# Patient Record
Sex: Male | Born: 1935
Health system: Southern US, Community
[De-identification: ages and names within clinical notes are randomized; demographics above are authoritative.]

## PROBLEM LIST (undated history)

## (undated) DIAGNOSIS — Z950 Presence of cardiac pacemaker: Secondary | ICD-10-CM

## (undated) DIAGNOSIS — E785 Hyperlipidemia, unspecified: Secondary | ICD-10-CM

## (undated) DIAGNOSIS — I2510 Atherosclerotic heart disease of native coronary artery without angina pectoris: Secondary | ICD-10-CM

## (undated) DIAGNOSIS — I82409 Acute embolism and thrombosis of unspecified deep veins of unspecified lower extremity: Secondary | ICD-10-CM

## (undated) DIAGNOSIS — H9190 Unspecified hearing loss, unspecified ear: Secondary | ICD-10-CM

## (undated) DIAGNOSIS — Z860100 Personal history of colon polyps, unspecified: Secondary | ICD-10-CM

## (undated) DIAGNOSIS — I255 Ischemic cardiomyopathy: Secondary | ICD-10-CM

## (undated) DIAGNOSIS — I739 Peripheral vascular disease, unspecified: Secondary | ICD-10-CM

## (undated) DIAGNOSIS — I480 Paroxysmal atrial fibrillation: Secondary | ICD-10-CM

## (undated) DIAGNOSIS — R001 Bradycardia, unspecified: Secondary | ICD-10-CM

## (undated) DIAGNOSIS — Z8619 Personal history of other infectious and parasitic diseases: Secondary | ICD-10-CM

## (undated) DIAGNOSIS — I10 Essential (primary) hypertension: Secondary | ICD-10-CM

## (undated) DIAGNOSIS — H269 Unspecified cataract: Secondary | ICD-10-CM

## (undated) DIAGNOSIS — Z8711 Personal history of peptic ulcer disease: Secondary | ICD-10-CM

## (undated) DIAGNOSIS — T827XXA Infection and inflammatory reaction due to other cardiac and vascular devices, implants and grafts, initial encounter: Secondary | ICD-10-CM

## (undated) DIAGNOSIS — N183 Chronic kidney disease, stage 3 unspecified: Secondary | ICD-10-CM

## (undated) DIAGNOSIS — I4892 Unspecified atrial flutter: Secondary | ICD-10-CM

## (undated) DIAGNOSIS — Z8739 Personal history of other diseases of the musculoskeletal system and connective tissue: Secondary | ICD-10-CM

## (undated) DIAGNOSIS — I5022 Chronic systolic (congestive) heart failure: Secondary | ICD-10-CM

## (undated) DIAGNOSIS — Z8719 Personal history of other diseases of the digestive system: Secondary | ICD-10-CM

## (undated) DIAGNOSIS — H353190 Nonexudative age-related macular degeneration, unspecified eye, stage unspecified: Secondary | ICD-10-CM

## (undated) DIAGNOSIS — C61 Malignant neoplasm of prostate: Secondary | ICD-10-CM

## (undated) HISTORY — PX: COLONOSCOPY: SHX174

## (undated) HISTORY — DX: Malignant neoplasm of prostate: C61

## (undated) HISTORY — PX: APPENDECTOMY: SHX54

## (undated) HISTORY — DX: Atherosclerotic heart disease of native coronary artery without angina pectoris: I25.10

## (undated) HISTORY — PX: INSERT / REPLACE / REMOVE PACEMAKER: SUR710

## (undated) HISTORY — PX: CARDIAC CATHETERIZATION: SHX172

## (undated) HISTORY — DX: Peripheral vascular disease, unspecified: I73.9

## (undated) HISTORY — DX: Hyperlipidemia, unspecified: E78.5

## (undated) HISTORY — PX: ANGIOPLASTY: SHX39

## (undated) HISTORY — PX: CORONARY ANGIOPLASTY: SHX604

---

## 1988-05-21 HISTORY — PX: CORONARY ARTERY BYPASS GRAFT: SHX141

## 2000-03-06 ENCOUNTER — Ambulatory Visit (HOSPITAL_COMMUNITY): Admission: RE | Admit: 2000-03-06 | Discharge: 2000-03-06 | Payer: Self-pay | Admitting: Cardiovascular Disease

## 2001-01-09 ENCOUNTER — Encounter (INDEPENDENT_AMBULATORY_CARE_PROVIDER_SITE_OTHER): Payer: Self-pay | Admitting: Specialist

## 2001-01-09 ENCOUNTER — Other Ambulatory Visit: Admission: RE | Admit: 2001-01-09 | Discharge: 2001-01-09 | Payer: Self-pay | Admitting: Gastroenterology

## 2002-06-24 HISTORY — PX: DOPPLER ECHOCARDIOGRAPHY: SHX263

## 2003-09-14 ENCOUNTER — Ambulatory Visit (HOSPITAL_COMMUNITY): Admission: RE | Admit: 2003-09-14 | Discharge: 2003-09-15 | Payer: Self-pay | Admitting: Cardiology

## 2004-04-05 ENCOUNTER — Ambulatory Visit: Payer: Self-pay | Admitting: Gastroenterology

## 2004-04-18 ENCOUNTER — Ambulatory Visit: Payer: Self-pay | Admitting: Gastroenterology

## 2006-03-03 ENCOUNTER — Observation Stay (HOSPITAL_COMMUNITY): Admission: EM | Admit: 2006-03-03 | Discharge: 2006-03-04 | Payer: Self-pay | Admitting: Emergency Medicine

## 2007-09-16 ENCOUNTER — Ambulatory Visit: Payer: Self-pay | Admitting: Gastroenterology

## 2007-10-15 ENCOUNTER — Encounter: Payer: Self-pay | Admitting: Gastroenterology

## 2007-10-15 ENCOUNTER — Ambulatory Visit: Payer: Self-pay | Admitting: Gastroenterology

## 2007-10-16 ENCOUNTER — Encounter: Payer: Self-pay | Admitting: Gastroenterology

## 2008-05-03 ENCOUNTER — Inpatient Hospital Stay (HOSPITAL_COMMUNITY): Admission: EM | Admit: 2008-05-03 | Discharge: 2008-05-08 | Payer: Self-pay | Admitting: Emergency Medicine

## 2009-08-23 ENCOUNTER — Encounter: Payer: Self-pay | Admitting: Internal Medicine

## 2009-08-23 ENCOUNTER — Ambulatory Visit: Admission: RE | Admit: 2009-08-23 | Discharge: 2009-10-31 | Payer: Self-pay | Admitting: Radiation Oncology

## 2010-01-27 ENCOUNTER — Ambulatory Visit: Payer: Self-pay | Admitting: Cardiovascular Disease

## 2010-02-25 ENCOUNTER — Encounter: Payer: Self-pay | Admitting: Internal Medicine

## 2010-03-15 ENCOUNTER — Ambulatory Visit: Payer: Self-pay | Admitting: Internal Medicine

## 2010-06-16 ENCOUNTER — Encounter: Payer: Self-pay | Admitting: Internal Medicine

## 2010-06-16 ENCOUNTER — Ambulatory Visit
Admission: RE | Admit: 2010-06-16 | Discharge: 2010-06-16 | Payer: Self-pay | Source: Home / Self Care | Attending: Internal Medicine | Admitting: Internal Medicine

## 2010-06-16 DIAGNOSIS — I4891 Unspecified atrial fibrillation: Secondary | ICD-10-CM | POA: Insufficient documentation

## 2010-06-16 DIAGNOSIS — I495 Sick sinus syndrome: Secondary | ICD-10-CM | POA: Insufficient documentation

## 2010-06-16 DIAGNOSIS — I10 Essential (primary) hypertension: Secondary | ICD-10-CM | POA: Insufficient documentation

## 2010-06-16 DIAGNOSIS — I2510 Atherosclerotic heart disease of native coronary artery without angina pectoris: Secondary | ICD-10-CM | POA: Insufficient documentation

## 2010-06-20 NOTE — Procedures (Signed)
Summary: pacer check/medtronic   Current Medications (verified): 1)  Lisinopril 10 Mg Tabs (Lisinopril) .... One By Mouth Daily  Allergies (verified): No Known Drug Allergies  PPM Specifications Following MD:  Thompson Grayer, MD     Referring MD:  Vira Agar PPM Vendor:  Medtronic     PPM Model Number:  [id number]     PPM Serial Number:  [id number] PPM DOI:  09/14/2003     PPM Implanting MD:  Romeo Apple, MD  Lead 1    Location: RA     DOI: 09/14/2003     Model #: [id number]     Serial #: [id number]     Status: active Lead 2    Location: RV     DOI: 09/14/2003     Model #: [id number]     Serial #: [id number]     Status: active  Magnet Response Rate:  BOL 85 ERI 65  Indications:  Sick sinus syndrome   PPM Follow Up Remote Check?  No Battery Voltage:  2.74 V     Battery Est. Longevity:  2.5 years     Pacer Dependent:  Yes       PPM Device Measurements Atrium  Impedance: 453 ohms, Threshold: 0.5 V at 0.4 msec Right Ventricle  Amplitude: 15.68 mV, Impedance: 457 ohms, Threshold: 0.5 V at 0.4 msec  Episodes MS Episodes:  42     Percent Mode Switch:  <0.1%     Coumadin:  No Ventricular High Rate:  0     Atrial Pacing:  95.8%     Ventricular Pacing:  7.7%  Parameters Mode:  DDDR     Lower Rate Limit:  60     Upper Rate Limit:  130 Paced AV Delay:  280     Sensed AV Delay:  200 Next Cardiology Appt Due:  05/21/2010 Tech Comments:  No parameter changes. Device function normal. No Carelink @ this time.  ROV 60months with Dr. Rayann Heman. Alma Friendly, LPN  October 26, 624THL 12:29 PM

## 2010-06-20 NOTE — Miscellaneous (Signed)
Summary: Device preload  Clinical Lists Changes  Observations: Added new observation of PPM INDICATN: Sick sinus syndrome (02/25/2010 16:53) Added new observation of MAGNET RTE: BOL 85 ERI 65 (02/25/2010 16:53) Added new observation of PPMLEADSTAT2: active (02/25/2010 16:53) Added new observation of PPMLEADSER2: [id number]  (02/25/2010 16:53) Added new observation of PPMLEADMOD2: 4470  (02/25/2010 16:53) Added new observation of PPMLEADDOI2: 09/14/2003  (02/25/2010 16:53) Added new observation of PPMLEADLOC2: RV  (02/25/2010 16:53) Added new observation of PPMLEADSTAT1: active  (02/25/2010 16:53) Added new observation of PPMLEADSER1: [id number]  (02/25/2010 16:53) Added new observation of PPMLEADMOD1: 4469  (02/25/2010 16:53) Added new observation of PPMLEADDOI1: 09/14/2003  (02/25/2010 16:53) Added new observation of PPMLEADLOC1: RA  (02/25/2010 16:53) Added new observation of PPM IMP MD: Romeo Apple, MD  (02/25/2010 16:53) Added new observation of PPM DOI: 09/14/2003  (02/25/2010 16:53) Added new observation of PPM SERL#: [id number]  (02/25/2010 16:53) Added new observation of PPM MODL#: [id number]  (02/25/2010 16:53) Added new observation of PACEMAKERMFG: Medtronic  (02/25/2010 16:53) Added new observation of PPM REFER MD: Vira Agar  (02/25/2010 16:53) Added new observation of PACEMAKER MD: Thompson Grayer, MD  (02/25/2010 16:53)      PPM Specifications Following MD:  Thompson Grayer, MD     Referring MD:  Vira Agar PPM Vendor:  Medtronic     PPM Model Number:  [id number]     PPM Serial Number:  [id number] PPM DOI:  09/14/2003     PPM Implanting MD:  Romeo Apple, MD  Lead 1    Location: RA     DOI: 09/14/2003     Model #: [id number]     Serial #: [id number]     Status: active Lead 2    Location: RV     DOI: 09/14/2003     Model #: [id number]     Serial #: [id number]     Status: active  Magnet Response Rate:  BOL 85 ERI 65  Indications:  Sick sinus syndrome

## 2010-06-22 NOTE — Assessment & Plan Note (Signed)
Summary: [email or web address]   Visit Type:  Pacemaker check Referring Provider:  Dr Acie Fredrickson Primary Provider:  none   History of Present Illness: Marcus [name] is a 77 yo WM with a h/o symptomatic bradycardia s/p PPM (MDT) by Dr Doreatha Lew 2005, paroxysmal atrial fibrillation, and CAD who presents today to establish care in the pacemaker clinic.  He reports doing well recenlty.  He has been noncompliant with his medicines, but continues to take a long list of supplements.  He states that he has no intention of taking prescribed medicines.  He is however complaint with ASA. The patient denies symptoms of palpitations, chest pain, shortness of breath, orthopnea, PND, lower extremity edema, dizziness, presyncope, syncope, or neurologic sequela. The patient is tolerating medications without difficulties and is otherwise without complaint today.   Current Medications (verified): 1)  Alpha-Lipoic Acid 300 Mg Tabs (Alpha-Lipoic Acid) .... Once Daily 2)  Acetyl L-Carnitine 500 Mg Caps (Acetylcarnitine Hcl) .... Once Daily 3)  Vitamin B Complex-C   Caps (B Complex-C) .... Once Daily 4)  Niacin Cr 1000 Mg Cr-Tabs (Niacin) .... 2 Once Daily 5)  Pyridoxal-5-Phosphate  Powd (Pyridoxal-5 Phosphate) .... Once Daily 6)  Methylcobalamin  Powd (Methylcobalamin) .... Once Daily 7)  Vitamin E 600 Unit  Caps (Vitamin E) .... Once Daily 8)  Vitamin C 500 Mg  Tabs (Ascorbic Acid) .... Once Daily 9)  Quercetin 250 Mg Tabs (Quercetin) .... 1000mg  10)  Benfotiamine 11)  Bio-Curcumin .Marland Kitchen.. 800 Mg 12)  Magnesium Citrate  Powd (Magnesium Citrate) .... 800 Mg Tab 13)  Co Q-10 30 Mg  Caps (Coenzyme Q10) .... 100mg  Tab 14)  Vitamin D 1000 Unit  Tabs (Cholecalciferol) .... 5000iu 15)  Vit K-2 16)  Natural Iodine .Marland Kitchen.. 1045mcg 17)  Omega-3 Krill Oil 300 Mg Caps (Krill Oil) .... Once Daily 18)  Icaps Lutein-Zeaxanthin  Cr-Tabs (Specialty Vitamins Products) .... Once Daily 19)  Dim Complex .... 2000mg  20)  Selenium 200 Mcg Tabs  (Selenium) .... Once Daily 21)  Calcium Carbonate-Vitamin D 600-400 Mg-Unit  Tabs (Calcium Carbonate-Vitamin D) .... 2000mg  22)  Indole-3-Carbinol  Powd (Indole-3-Carbinol) .... 350 Mg 23)  Lycopene 10 Mg Caps (Lycopene) .... 30 Mg 24)  5 Lox Inhibitor .Marland Kitchen.. 100 Mg 25)  Prostate  Caps (Misc Natural Products) .... 1150mg   Allergies (verified): No Known Drug Allergies  Past History:  Past Medical History:  History of coronary artery disease.  He is status post percutaneous       transluminal coronary angioplasty and stenting of the obtuse       marginal artery, and status post percutaneous transluminal coronary       angioplasty and stenting of the right coronary artery.   Marland Kitchen History of coronary artery disease - status post coronary artery       bypass grafting.   Hypertension.   Bradycardia s/p PPM paroxysmal afib- not compliant with coumadin HL  Past Surgical History: CABG PPM (MDT)  Social History: lives in [place] denies TED  Review of Systems       All systems are reviewed and negative except as listed in the HPI.   Vital Signs:  Patient profile:   77 year old male Height:      70 inches Weight:      144 pounds BMI:     20.74 Pulse rate:   85 / minute BP sitting:   146 / 82  (left arm) Cuff size:   regular  Vitals Entered By: Marcus [name], RMA (June 16, 2010 11:35  AM)  Physical Exam  General:  thin, NAD Head:  normocephalic and atraumatic Eyes:  PERRLA/EOM intact; conjunctiva and lids normal. Mouth:  Teeth, gums and palate normal. Oral mucosa normal. Neck:  supple Chest Wall:  pacemaker pocket is well healed Lungs:  Clear bilaterally to auscultation and percussion. Heart:  Non-displaced PMI, chest non-tender; regular rate and rhythm, S1, S2 without murmurs, rubs or gallops. Carotid upstroke normal, no bruit. Normal abdominal aortic size, no bruits. Femorals normal pulses, no bruits. Pedals normal pulses. No edema, no varicosities. Abdomen:  Bowel  sounds positive; abdomen soft and non-tender without masses, organomegaly, or hernias noted. No hepatosplenomegaly. Msk:  Back normal, normal gait. Muscle strength and tone normal. Extremities:  No clubbing or cyanosis. Neurologic:  Alert and oriented x 3. Skin:  Intact without lesions or rashes.   PPM Specifications Following MD:  Thompson Grayer, MD     Referring MD:  Vira Agar PPM Vendor:  Medtronic     PPM Model Number:  [id number]     PPM Serial Number:  [id number] PPM DOI:  09/14/2003     PPM Implanting MD:  Romeo Apple, MD  Lead 1    Location: RA     DOI: 09/14/2003     Model #: [id number]     Serial #: [id number]     Status: active Lead 2    Location: RV     DOI: 09/14/2003     Model #: [id number]     Serial #: [id number]     Status: active  Magnet Response Rate:  BOL 85 ERI 65  Indications:  Sick sinus syndrome   PPM Follow Up Battery Voltage:  2.73 V     Battery Est. Longevity:  2 YRS     Pacer Dependent:  Yes       PPM Device Measurements Atrium  Amplitude: 2.00 mV, Impedance: 466 ohms, Threshold: 0.50 V at 0.40 msec Right Ventricle  Amplitude: 15.68 mV, Impedance: 451 ohms, Threshold: 0.50 V at 0.40 msec  Episodes MS Episodes:  0     Coumadin:  No Ventricular High Rate:  0     Atrial Pacing:  99%     Ventricular Pacing:  2.3%  Parameters Mode:  DDDR     Lower Rate Limit:  60     Upper Rate Limit:  130 Paced AV Delay:  280     Sensed AV Delay:  200 Next Cardiology Appt Due:  11/20/2010 Tech Comments:  NORMAL DEVICE FUNCTION.  NO EPISODES RECORDED. CHANGED RA OUTPUT FROM 1.5 TO 2.0 AND RV OUTPUT FROM 2.0 TO 2.5 V.  PT PREFERS OV RATHER THAN CARELINK. ROV IN 6 MTHS W/DEVICE CLINIC. Shelly Bombard  June 16, 2010 11:45 AM MD Comments:  agree  Impression & Recommendations:  Problem # 1:  SICK SINUS SYNDROME (ICD-427.81) normal pacemaker function as above  Problem # 2:  ATRIAL FIBRILLATION (ICD-427.31) no recent afib by pacemaker pt understands that his CHADSVasc score is at  least 3 but is very clear that he will not take coumadin, pradaxa, or xarelto he will continue ASA  Problem # 3:  ESSENTIAL HYPERTENSION, BENIGN (ICD-401.1) above goal he is noncompliant with medicines  Problem # 4:  CAD (ICD-414.00) no ischemic symptoms he is noncomplaint with medicines but continues to take a host of supplements  Patient Instructions: 1)  Your physician wants you to follow-up in: 6 month follow up with device clinic and 12 months with Dr Vallery Ridge will receive a reminder  letter in the mail two months in advance. If you don't receive a letter, please call our office to schedule the follow-up appointment.

## 2010-06-28 NOTE — Cardiovascular Report (Signed)
Summary: Office Visit   Office Visit   Imported By: Sallee Provencal 06/21/2010 15:17:35  _____________________________________________________________________  External Attachment:    Type:   Image     Comment:   External Document

## 2010-07-12 NOTE — Progress Notes (Signed)
Summary: [facility]: Office Visit  [facility]: Office Visit   Imported By: Roddie Mc 07/03/2010 11:28:32  _____________________________________________________________________  External Attachment:    Type:   Image     Comment:   External Document

## 2010-08-01 ENCOUNTER — Ambulatory Visit (INDEPENDENT_AMBULATORY_CARE_PROVIDER_SITE_OTHER): Payer: Medicare Other | Admitting: Cardiovascular Disease

## 2010-08-01 DIAGNOSIS — Z9861 Coronary angioplasty status: Secondary | ICD-10-CM

## 2010-08-01 DIAGNOSIS — E7800 Pure hypercholesterolemia, unspecified: Secondary | ICD-10-CM

## 2010-10-03 NOTE — Cardiovascular Report (Signed)
NAME:  JOSPEH, SUTHERLAND NO.:  [id number]   MEDICAL RECORD NO.:  [id number]          PATIENT TYPE:  INP   LOCATION:  6529                         FACILITY:  [facility]   PHYSICIAN:  Thayer Headings, M.D. DATE OF BIRTH:  [birth year]   DATE OF PROCEDURE:  05/07/2008  DATE OF DISCHARGE:                            CARDIAC CATHETERIZATION   Marcus [name] is a 77 year old gentleman with a history of congestive  heart failure.  He was recently admitted for worsening shortness of  breath.  He had a heart catheterization which revealed a tight stenosis  in his obtuse marginal artery and left main artery.  He had an occlusion  of his right coronary artery.  He underwent successful PTCA and stenting  of his distal right coronary artery on the initial cath.  He was brought  back today for second stage procedure for PCI to OM1 and protected left  main.   The procedure was PTCA and stenting of the obtuse marginal artery and  left main coronary artery.   The right femoral artery was easily cannulated using modified Seldinger  technique.   HEMODYNAMICS:  Aortic pressure 96/49.   The left main was engaged using an XP-4 guide.  Angiomax was given.  ACT  was 315.   We used multiple wires in an attempt to access the distal circumflex  vessel.  We tried a Programmer, applications, a Energy manager, high-torque floppy  coated wire.  Eventually, we were able to cross with a BMW wire.  On  several occasions, we were out into the plaque and clearly had a small  dissection.  There was no evidence ever of any subintimal dye  extravasation.  There was no evidence of perforation into the  pericardium.  We were finally able to place the BMW wire and verify that  we were intraluminal with a contrast.   Following this, a 2.0 x 15-mm Voyager was positioned across the stenosis  and was inflated up to 8 atmospheres for 43 seconds.  A second inflation  of 8 atmospheres for 27 seconds.  We then pulled the  balloon back  slightly and inflated up to 10 atmospheres for 20 seconds.  At this  point, we determined that it was large enough to place a stent.  We  placed a 2.25 x 28-mm Taxus Adams stent.  It was deployed a 12  atmospheres for 31 seconds in the distal aspect of the stenosis.  Post  stent dilatation was achieved using a 2.5 x 20-mm Voyager [place].  It was  pulled up to 12 atmospheres for 19 seconds and then pulled back more  proximally inflated up to 16 atmospheres for 30 seconds.   Following this, a 3.0 x 18 mm Xience stent was placed into the left main  extending out into the circumflex artery.  It was deployed at 12  atmospheres for 32 seconds.  It was then pulled back out into the left  main and aorta and was inflated up to 15 atmospheres for 16 seconds.  Later inflation was performed to flare the proximal edge.   Post stent dilatation was  achieved using a Voyager [place].  We used a 3.25 x  15-mm balloon.  It was inflated in the middle of the stent for 10  atmospheres for 36 seconds.  It was then positioned in the overlap  section for 14 atmospheres for 18 seconds, then pulled back proximally  and inflated up to 14 atmospheres for 17 seconds.  This gave [place] a very  nice angiographic result.  There is no evidence of edge dissection.  There is a fairly nice lumen.  He does have severe calcification of his  native coronary arteries.   In the middle of the case, the patient began having some tachycardia.  It was not sure whether he was having a supraventricular tachycardia.  He started pacing at a rate of 120-130.  We verified the position of the  wire.  This did not seem to resolve the issue.  We gave him 150 mg of  amiodarone which ultimately resolved this tachycardia.  We will continue  to watch him closely for this.   COMPLICATIONS:  None.   CONCLUSION:  Successful PTCA and stenting of the first obtuse marginal  artery and the left main coronary artery.  He was transferred [id number]   without complications.      Thayer Headings, M.D.  Electronically Signed     PJN/MEDQ  D:  05/07/2008  T:  05/07/2008  Job:  [id number]   cc:   Elta Guadeloupe A. Perini, M.D.

## 2010-10-03 NOTE — Assessment & Plan Note (Signed)
[facility] OFFICE NOTE   NAME:Marcus [name], Marcus [name]                    MRN:          [id number]  DATE:09/16/2007                            DOB:          06-22-1928    GI PROBLEM LIST:  1. Family history of colon cancer. His mother died of colon cancer.  2. Personal history of tubular adenomas. Last colonoscopy November      2005 by Dr. Lyla Son, 2 colon polyps were removed, the largest      was 9 mm taken from the hepatic flexure. This was found to be a      tubular adenoma and he was recommended to have a repeat colonoscopy      3 years from that date.   INTERVAL HISTORY:  Marcus [name] was last seen here at the time of his  colonoscopy by Dr. Lyla Son. He was recommended to have a repeat  colonoscopy in November 2008. He has had no troubles with his bowels,  specifically no constipation, no diarrhea and no bleeding.   CURRENT MEDICATIONS:  He is on no prescription medicines although he is  on at least 20 herbal supplements.   PHYSICAL EXAMINATION:  VITAL SIGNS:  Weight is 152 pounds, blood  pressure 140/70, pulse 60.  CONSTITUTIONAL:  Generally well appearing.  ABDOMEN:  Soft, nontender, nondistended, normal bowel sounds.   ASSESSMENT/PLAN:  A 77 year old man at elevated risk for colorectal  cancer given personal history of 2 adenomas and family history of colon  cancer.   The usual interval for patient's with 1 or 2 small tubular adenomas and  patients with family history of colon cancer is 5 year interval. That  being said, Dr. Velora Heckler recommended he have a colonoscopy 3 years from  now and I will not change that interval from his previous  gastroenterologist and so will arrange for him to have a colonoscopy  performed at his soonest convenience. I will follow colorectal cancer  screening and polyp guidelines following that colonoscopy.     Milus Banister, MD  Electronically Signed    DPJ/MedQ  DD: 09/16/2007  DT: 09/16/2007  Job #: [id number]   cc:   Elta Guadeloupe A. Perini, M.D.

## 2010-10-03 NOTE — Cardiovascular Report (Signed)
NAME:  [name], [name] NO.:  [id number]   MEDICAL RECORD NO.:  [id number]          PATIENT TYPE:  INP   LOCATION:  6529                         FACILITY:  [facility]   PHYSICIAN:  Thayer Headings, M.D. DATE OF BIRTH:  [birth year]   DATE OF PROCEDURE:  05/04/2008  DATE OF DISCHARGE:                            CARDIAC CATHETERIZATION   Marcus [name] is an 77 year old gentleman with a history of  hyperlipidemia and coronary artery disease.  He is status post coronary  artery bypass grafting.  He is also status post PTCA and stenting of his  right coronary artery.  He presents to the hospital yesterday with  congestive heart failure.  He was diuresed.  He had mildly positive  troponin levels.  He had relatively normal CPK levels.  He was referred  for heart catheterization for further evaluation.   PROCEDURE:  Left heart catheterization with coronary angiography with  percutaneous transluminal coronary angioplasty and stenting of the  distal right coronary artery.   The right femoral artery was easily cannulated using a modified  Seldinger technique.   HEMODYNAMICS:  LV pressure was 105/7 with an aortic pressure of 105/53.   ANGIOGRAPHY:  The left main.  The left main has a mid 50% stenosis  followed by distal 70-80% stenosis.  This lesion is fairly, heavily  calcified.   The left anterior descending artery had proximal irregularities.  It was  then occluded after giving off a large septal perforator.   The left circumflex artery has an 80-90% stenosis in the proximal  segment.  This stenosis is fairly eccentric.  Compared to his previous  heart catheterization, this has worsened from 50-60%.  This is a  moderate-to-large branch.   The right coronary artery is occluded distally.  The distal RCA fills  via left-to-right right collaterals.   The saphenous vein graft to the obtuse marginal artery is occluded.  The  left internal mammary artery to the LAD is widely  patent.  There is  brisk flow.   The left ventriculogram reveals moderate-to-severe left ventricular  dysfunction.  There is anteroapical severe hypokinesis.  There is  inferior basilar akinesis.  This inferolateral severe hypokinesis.  The  ejection fraction is 30-35%.   PTCA of the right coronary artery was engaged using a Judkins right 4  side-hole guide.  Angiomax was given.  ACT was 263.   A P[place]ater angioplasty wire was passed down across the occlusion and  into the distal right coronary artery.  We selected the posterolateral  branch initially.  A 2.5 x 20 mm Apex was used to dilate the distal  right coronary artery.  We inflated it up to 6 atmospheres for 47  seconds.  This provided [place] with a very nice view of the distal lesion.  He had good flow down the posterolateral branch in the posterior  descending artery.  The posterior descending artery was a larger vessel,  so we re-directed the wire down the posterior descending artery.  At  that point, a 2.5 x 20 mm XIENCE was positioned across the stenosis.  We  placed the distal edge  of the stent was positioned out into the  posterior descending artery and the more proximal edge extended, past  the original stenosis.  It was deployed at 10 atmospheres for 30  seconds.  This gave a very nice angiographic result with no evidence of  edge dissection.   Post-stent dilatation was achieved using a 2.75 mm x 20 mm Voyager [place].  It was positioned distally and inflated up to 10 atmospheres for 18  seconds.  It was then pulled back to the proximal edge of the stent and  inflated up to 12 atmospheres for 24 seconds.  This gave [place] a very nice  angiographic result with brisk flow down, both branches.   COMPLICATIONS:  None.   CONCLUSION:  Successful PTCA and stenting of the right coronary artery.   The patient still has a tight stenosis in the left main and the left  circumflex artery.  We will discuss doing PTCA and stenting of the  left  main and left circumflex artery on Friday.   We need to start him on standard heart failure medicines.  He will be  started on Coreg and lisinopril.  He has refused to start these  medications in the past, but he has agreed to be more receptive to  starting proper therapy at this point.  His ejection fraction is around  30-35%.      Thayer Headings, M.D.  Electronically Signed     PJN/MEDQ  D:  05/04/2008  T:  05/05/2008  Job:  [id number]   cc:   Elta Guadeloupe A. Perini, M.D.

## 2010-10-03 NOTE — H&P (Signed)
NAMEMarland Kitchen  IZAIAS, MICHALSKI NO.:  [id number]   MEDICAL RECORD NO.:  [id number]          PATIENT TYPE:  INP   LOCATION:  2036                         FACILITY:  [facility]   PHYSICIAN:  Thayer Headings, M.D. DATE OF BIRTH:  12-06-1928   DATE OF ADMISSION:  05/03/2008  DATE OF DISCHARGE:                              HISTORY & PHYSICAL   Marcus [name] is a 77 year old gentleman with a history of coronary  artery disease.  He is status post PTCA and stenting to his right  coronary artery.  He has a history of hypercholesterolemia, heart  murmur, and history of bradycardia.  He also has a history of coronary  artery bypass grafting.   The patient is admitted with progressive shortness of breath.   Marcus [name] was seen in the office just a couple of weeks ago.  He was  doing fine up until that point.  He is an avid exerciser and works out  everyday.  For the past couple of weeks, he has noticed that his  endurance has been decreasing.  He has noticed that he is having  shortness of breath with moderate exercise.  He used to be able to jog  on his cardiac workup, but now he finds that he is short of breath just  with walking.  He also went to [employer] and tried to climb his usual  4 flights of stairs.  He became very short of breath at climbing the  stairs.  This is something that he had previously been able to do on  multiple occasions.   He describes a pleuritic component to the chest pain with bilateral  chest discomfort with a deep breath.  He denies any syncope or  presyncope.  He denies any cough or sputum production.  He denies any  fevers or chills, weight gain or weight loss.  He denies any other upper  respiratory tract infections.  He denies any blurry vision.  He denies  any heat or cold intolerance.  He denies any angina.  He he did have  angina with his previous angioplasty several years ago.  This episode  does not feel anything like that previous episode.   He denies any blood  in his urine or blood in stool.  He denies any change in his appetite or  change in his bowel habits.  He denies any numbness or tingling.  He  does describe some dizziness that has been occurring recently.  This is  new over the past couple of weeks.  He denies any problems with his  gait.  All other systems were reviewed and are negative.   CURRENT MEDICATIONS:  1. Metoprolol 25 mg p.o. b.i.d.  2. Niacin 1000 mg a day.   ALLERGIES:  None.   PAST MEDICAL HISTORY:  1. History of coronary artery disease.  He is status post coronary      artery bypass grafting.  He is also status post PTCA and stenting      of his right coronary artery.  2. History of bradycardia with pacemaker implantation.  3. Dyslipidemia.  The patient  refuses to take statins.  4. History of hypertension.   FAMILY HISTORY:  His father died at age 12 due to alcoholism.  His  mother died at age 70 and had a history of hypertension and cancer.   SOCIAL HISTORY:  The patient is a former smoker.  He drinks alcohol only  occasionally.   REVIEW OF SYSTEMS:  As reviewed in the HPI.  All other systems were  reviewed and are negative.   PHYSICAL EXAMINATION:  GENERAL:  He is an elderly gentleman in no acute  distress.  He is alert and oriented x3.  His mood and affect are normal.  VITAL SIGNS:  His heart rate is 66 and blood pressure is 122/64.  HEENT:  He is normocephalic and atraumatic.  His sclerae nonicteric.  Mucous membranes are moist.  There is no JVD.  His carotids are 2+ and  are without bruits.  There is no thyromegaly.  LUNGS:  Bibasilar rales.  CARDIAC:  Normal regular rate.  There is no S3 gallop.  ABDOMINAL:  Slightly distended.  He has good bowel sounds and there are  no areas of tenderness.  EXTREMITIES:  He has trace 1+ bilateral edema, left greater than right.  His pulses are 2+.  NEUROLOGIC:  His motor and sensory functions are grossly intact.  His  gait was not able to be  assessed.  SKIN:  Warm and dry.   LABORATORY DATA:  His B natriuretic peptide is 1024.  His sodium is 137,  potassium is 4.0, chloride is 104, CO2 is 23, creatinine is 1.3.  His  hemoglobin is 11.9 with a hematocrit of 35%.   His chest x-ray reveals a right pleural effusion.  He has a right-sided  density that is just adjacent to the pacemaker.  Multiple chest x-rays  were performed at various angles to identify this density.   His troponin is 0.52.  His CPK-MB is 1.4.   His EKG reveals atrial pacing at a rate of 61 beats a minute.  He has no  ST or T wave changes.   IMPRESSION AND PLAN:  Marcus [name] presents with shortness of breath  that has worsened over the past couple of weeks.  Differential diagnosis  includes worsening coronary artery disease, the development of  congestive heart failure or possible pulmonary embolus.  He does not  have any symptoms or signs of a respiratory tract infection.  He does  have some rales on exam which I think is due to congestive heart  failure.  He has not been coughing up any sputum.   We will get a spiral CT today for further evaluation of possibility of  pulmonary embolus.  If that is negative, we will anticipate doing a  heart catheterization tomorrow.  We will collect cardiac enzymes.  We  will diurese him with 40 IV of Lasix today and will also replace his  potassium.  He has had an echocardiogram 5 years ago in the office.  His  left ventricular systolic function was [id number] at that time.  He will  need an outpatient echocardiogram.  All of his other medical problems  are fairly stable.      Thayer Headings, M.D.  Electronically Signed     PJN/MEDQ  D:  05/03/2008  T:  05/03/2008  Job:  [id number]   cc:   Marcus [name], M.D.

## 2010-10-06 NOTE — Cardiovascular Report (Signed)
[place]  Patient:    Marcus [name], Marcus [name]                    MRN: [id number] Proc. Date: 03/06/00 Adm. Date:  [id number] Attending:  Randa Spike CC:         Cardiac Catheterization Laboratory                        Cardiac Catheterization  INDICATIONS:  Mr. Fausey is a 77 year old gentleman with a history of coronary artery disease--status post coronary artery bypass grafting.  He is status post PTCA and stenting of his right coronary artery several years ago. He recently has had some shortness of breath with exertion.  A recent Cardiolite study revealed inferolateral ischemia.  He is referred for heart catheterization for further evaluation.  PROCEDURES:  Left heart catheterization with coronary angiography.  DESCRIPTION OF PROCEDURE:  The right femoral artery was easily cannulated using a modified Seldinger technique.  HEMODYNAMICS:  The left ventricular pressure was 157/22 with an aortic pressure of 157/70.  ANGIOGRAPHY:  The left main coronary artery is normal in its proximal segment. The distal left main tapers to approximately 60-70% stenosis.  There is still fairly brisk flow through this left main.  Left anterior descending artery is a moderate sized vessel.  There are minor luminal irregularities in the proximal segment.  The mid LAD has diffuse 50-60% stenosis.  There is evidence of competitive from the IMA.  There is a large ramus intermediate branch/high marginal branch which has only minor luminal irregularities.  The flow through this branch is normal.  The left circumflex artery is a moderate sized vessel.  The first obtuse marginal artery has diffuse disease between 60-70% in the proximal segment. The flow down these branches are normal.  The anastomosis of the saphenous vein graft can be seen at the second obtuse marginal mid segment.  There is no significant flow from the saphenous vein graft.  There is small  amount of dye that goes up the graft indicating that it is closed.  The terminal of the circumflex artery terminates as small posterolateral branch.  The right coronary artery is a moderate sized vessel and is dominant.  There are moderate irregularities throughout the proximal and mid segments.  There is a 30% localized plaque in the mid RCA.  This segment has been previously stented.  This segment is basically unchanged from his previous heart catheterization in 1998.  The posterior descending artery and the posterolateral segment artery are unremarkable.  The saphenous vein graft to the obtuse marginal artery is occluded proximally.  The left internal mammary artery LAD graft is normal throughout its course. The LAD has minor luminal irregularities but is otherwise normal.  The LAD fills the diagonal vessel in a retrograde fashion and this flow appears to be fairly normal.  LEFT VENTRICULOGRAM:  A left ventriculogram was performed in a 30 RAO position.  It reveals a mildly dilated left ventricle.  There is mild mitral regurgitation.  The left atrium appears to be mildly enlarged.  Overall ejection fraction is well preserved; estimated to be approximately 65%.  COMPLICATIONS:  None.  CONCLUSIONS:  Native coronary artery disease.  There is a patent internal mammary artery to left anterior descending graft.  The saphenous vein graft to the obtuse marginal artery is occluded.  The previously stented mid right coronary artery is patent with only minor luminal irregularities. We will  continue with medical therapy.  His shortness of breath may be caused by his mild left ventricular dilatation and mild mitral regurgitation. DD:  03/06/00 TD:  03/06/00 Job: [id number]

## 2010-10-06 NOTE — Discharge Summary (Signed)
NAMEMarland Kitchen  Marcus [name], Marcus [name] NO.:  [id number]   MEDICAL RECORD NO.:  [id number]          PATIENT TYPE:  INP   LOCATION:  6529                         FACILITY:  [facility]   PHYSICIAN:  Thayer Headings, M.D. DATE OF BIRTH:  [birth year]   DATE OF ADMISSION:  05/03/2008  DATE OF DISCHARGE:  05/08/2008                               DISCHARGE SUMMARY   DISCHARGE DIAGNOSES:  1. History of coronary artery disease.  He is status post percutaneous      transluminal coronary angioplasty and stenting of the obtuse      marginal artery, and status post percutaneous transluminal coronary      angioplasty and stenting of the right coronary artery.  2. History of dyslipidemia.  3. History of coronary artery disease - status post coronary artery      bypass grafting.  4. Hypertension.   DISCHARGE MEDICATIONS:  1. Aspirin 81 mg a day.  2. Plavix 75 mg a day.  3. Niaspan 1000 mg a day.  4. Carvedilol 6.25 mg twice a day.  5. Lisinopril 10 mg a day.  6. Nitroglycerin 0.4 mg sublingually as needed.  7. Lasix 10 mg a day.  8. Potassium chloride 10 mEq a day.   HISTORY:  Marcus [name] is a 77 year old gentleman who was admitted to  the hospital on May 03, 2008, with episodes of chest discomfort.  He had positive cardiac enzymes.  He underwent heart catheterization.  He was found to have 50% left main stenosis with more distal 70-80% left  main stenosis.  The left main was calcified.  The left anterior  descending artery had some moderate luminal irregularities in the  proximal segment and then was occluded after the septal perforator.  The  left circumflex artery had an 80-90% eccentric stenosis.  This was a  moderate-to-large vessel.  The right coronary artery was occluded  distally.  The distal right coronary fills via left-to-right  collaterals.   The saphenous vein graft to the obtuse marginal artery was occluded.  The left internal mammary artery to LAD was widely patent with  brisk  flow.  The left ventriculogram reveals severe left ventricular  dysfunction and ejection fraction was 30-35%.  There was inferior apical  severe hypokinesis.  There was inferior basilar akinesis.  There was  anterolateral severe hypokinesis.   He initially underwent successful PTCA and stenting of the right  coronary artery.  We used a 2.5- x 23-mm Xience stent.  It was deployed  at 10 atmospheres.  Post-stent dilatation was achieved using a 2.75- x  20-mm Voyager [place], inflated up to 12 atmospheres.  This gave [place] a very  nice angiographic result of the right coronary artery.  The following  day, he underwent successful PTCA and stenting of the circumflex vessel.  We placed a 2.25- x 28-mm Taxus Adam stent.  It was deployed at 15  atmospheres.  Post-stent dilatation was achieved using a 2.5- x 28-mm  Voyager [place] inflated up to 16 atmospheres.  We also placed a 3.0- x 18-mm  Xience stent back into the left main extending from the circumflex  artery.  It was deployed at 12 atmospheres and then another inflation to  15 atmospheres.  The stent was postdilated using a 3.25- x 15-mm Voyager  [place] up to 14 atmospheres.  This gave [place] a very nice angiographic result  and there were no complications.  The patient did fairly well and was  discharged home the following morning.  Follow up with Dr. Acie Fredrickson and  Dr. Joylene Draft.  The patient has significant dyslipidemia, but refuses to  take statins.  He intermittently stopped many of his medications.  I  have to try to explain to him that it has been extremely reported for  him to take aspirin and Plavix since he has 2 drug-eluting stents.  We  will follow up with him in the office.      Thayer Headings, M.D.  Electronically Signed     PJN/MEDQ  D:  06/18/2008  T:  06/18/2008  Job:  [id number]   cc:   Elta Guadeloupe A. Perini, M.D.

## 2010-10-06 NOTE — H&P (Signed)
NAME:  Marcus [name], MIKLAS NO.:  [id number]   MEDICAL RECORD NO.:  [id number]          PATIENT TYPE:  INP   LOCATION:  2038                         FACILITY:  [facility]   PHYSICIAN:  Kaylyn Lim., M.D.DATE OF BIRTH:  February 09, 1929   DATE OF ADMISSION:  03/03/2006  DATE OF DISCHARGE:                                HISTORY & PHYSICAL   INDICATION FOR ADMISSION:  Chest pain.   PRIMARY CARDIOLOGIST:  Thayer Headings, M.D.   HISTORY OF PRESENT ILLNESS:  The patient is a very pleasant 77 year old  white male with past medical history of coronary artery disease (status post  coronary artery bypass grafting), dyslipidemia, hypertension, sick sinus  syndrome (status post dual-chamber permanent pacemaker implant) who  presented for evaluation of chest pain.  The patient reports normal state of  health and just returned from vacation at the beach and on Thursday had a  brief episode of substernal chest pressure.  He stated the pressure lasted  somewhere between 30 minutes and an hour and then was totally relieved.  He  denies any shortness of breath, nausea, vomiting or diaphoresis.  Friday he  went and worked out, did his normal activities without any limitations, had  no palpitations, presyncope, was back to normal.  Saturday night again he  did well through the daytime; however, at night started having some  substernal chest pressure, took an aspirin with some relief. Denies any  exertional component. His symptoms continued throughout the night. He then  presented to [facility] for further evaluation.  By looking on  electronic chart, the patient has been on multiple medications in the past.  However, right now he only takes an aspirin once daily.  The patient states  he thinks that he may have something wrong with his heart, however,  downplays his symptoms during our history.   REVIEW OF SYSTEMS:  Are as per HPI or otherwise negative.   CURRENT MEDICATIONS:   Aspirin p.r.n.   ALLERGIES:  None.   FAMILY HISTORY:  Is positive for colon cancer.   SOCIAL HISTORY:  He denies any significant tobacco or alcohol use.   PHYSICAL EXAMINATION:  VITAL SIGNS: He is afebrile.  Temperature is 97.0,  blood pressure 160/90 with heart rate in the 60s showing A pacing on the  monitor.  He is saturating 95% on room air.  GENERAL: He is an elderly white male, alert and oriented x4.  No acute  distress, currently without any chest pain.  HEENT: Appeared normal.  NECK: Supple.  No lymphadenopathy, 2+ carotids.  No JVD and no bruits.  LUNGS: Were clear.  HEART: Regular with a normal S1-S2.  No significant murmurs, gallops, or  rubs.  ABDOMEN: Soft, nontender, nondistended.  No rebound or guarding.  EXTREMITIES:  Warm with 2+ pulses and no significant edema.   His ECG showed atrial pacing with nonspecific ST-T wave changes.   His initial blood work showed a hemoglobin of 13.5, platelet count of 218,  and white count 4.8.  He had a BUN of 16, creatinine 1.0, and a potassium  level of 3.9.  His initial enzymes show CPK of 53, an MB of 1.2, and a  troponin of 0.01. His point-of-care markers prior to this check were  myoglobin of 111, CK-MB of 1.6 and troponin I of less in 0.05.   His chest x-ray shows sternal wires consistent with old bypass. He has a  right-sided pacemaker implant. No active pulmonary disease.   His coag panel was normal.   IMPRESSION:  1. Episodic chest pain starting on Thursday.  2. Medical noncompliance.  3. History of coronary artery disease status post coronary artery bypass      grafting.  4. History of sick sinus syndrome status post dual-chamber permanent      pacemaker implant.   PLAN:  At the current time, the patient will be admitted for observation.  He has some atypical and typical qualities with his chest pain.  I will  continue serial cardiac enzymes and treat with aspirin, Lopressor and  Lovenox.  The patient will have  nitroglycerin available if he needs it;  however, will hold off on nitroglycerin drip and Nitropaste at this time  because he is chest pain free.  Should his enzymes become positive, he will  likely require cardiac catheterization. However, should they be negative,  inpatient versus outpatient stress test will be up to Dr. Liam Rogers.  I  did discuss this with him at length.  He reports that he will not have a  stress test or catheterization, but he would like to discuss this for  further with Dr. Acie Fredrickson. I did tell him we would treat him to manage his  symptoms as much as possible.  Further treatment per Dr. Liam Rogers.      Kaylyn Lim., M.D.  Electronically Signed     TWK/MEDQ  D:  03/03/2006  T:  03/03/2006  Job:  [id number]   cc:   Thayer Headings, M.D.

## 2010-10-06 NOTE — Cardiovascular Report (Signed)
NAME:  Marcus [name], Marcus [name] NO.:  [id number]   MEDICAL RECORD NO.:  [id number]                   PATIENT TYPE:  OIB   LOCATION:  [id number]                                 FACILITY:  [facility]   PHYSICIAN:  Ludwig Lean. Doreatha Lew, M.D.            DATE OF BIRTH:  1928-10-29   DATE OF PROCEDURE:  09/14/2003  DATE OF DISCHARGE:                              CARDIAC CATHETERIZATION   PROCEDURE:  Implantation of dual-chamber pulse generator under fluoroscopy.   HISTORY:  Mr. Colaluca is a 77 year old gentleman with progressive  bradycardia and weakness and fatigue.  He is referred for implantation of a  dual-chamber pulse generator.   PROCEDURE:  The right subclavicular area was prepped and draped.  The area  was infiltrated with 1% Xylocaine.  Subcutaneous pocket was created to the  prepectoral fascia.   Punctures were made into the subclavian vein over top of the first rib.  There were two punctures made.  Using 7 French Cook introducers, the atrial  and ventricular lead were introduced.  The ventricular lead was placed in  the right ventricular apex.  Thresholds were 0.7 V to capture at 1.9 MA  current with 0.5 msec pulse width.  Impedance was 460 ohms and R waves were  11.0 mV.  The ventricular lead was a Guidant 4470 active fixation lead  serial number [id number].   The atrial lead was a Guidant model [id number], active fixation lead serial  number [id number].  It was introduced through a [place] introducer.  The thresholds in the atrium were 0.6 V at capture at 1.3 MA current with  0.5 msec pulse width.  Impedance was 448 ohms and P waves were 1.3 mV.   The leads were sutured in place.  The wound was flushed with kanamycin  solution.  The leads were connected to Medtronic Enpulse unit E2DR01, serial  number [id number].  The unit was sutured in place.  The wound was closed  with 2-0 and subsequently 5-0 Vicryl and Steri-Strips were applied.  Overall, the patient  tolerated the procedure well.                                               Ludwig Lean. Doreatha Lew, M.D.    SNT/MEDQ  D:  09/14/2003  T:  09/15/2003  Job:  [id number]   cc:   Thayer Headings, M.D.  [address], [address] [place]  [place] [address]  Fax: [phone]

## 2010-10-06 NOTE — Discharge Summary (Signed)
NAMEJENO, DEBOCK NO.:  [id number]   MEDICAL RECORD NO.:  [id number]          PATIENT TYPE:  INP   LOCATION:  2038                         FACILITY:  [facility]   PHYSICIAN:  Thayer Headings, M.D. DATE OF BIRTH:  [birth year]   DATE OF ADMISSION:  03/03/2006  DATE OF DISCHARGE:  03/04/2006                                 DISCHARGE SUMMARY   DISCHARGE DIAGNOSES:  1. Noncardiac chest pain.  2. History of coronary artery disease.  3. History of pacemaker implantation.  4. History of hyperlipidemia.   DISCHARGE MEDICATIONS:  1. Aspirin 81 mg a day.  2. Metoprolol 25 mg p.o. b.i.d.  3. Nitroglycerin 0.4 mg sublingually as needed for chest pain.  4. Prilosec 20 mg a day as needed.  5. Mylanta as needed.   The patient refuses to a statin medication.   DISPOSITION:  The patient will see Dr. Acie Fredrickson in 1-2 weeks.  He is to call  me sooner if he has recurrent chest pain.  I have encouraged him to see Dr.  Joylene Draft, and I have also encouraged him to see a GI specialist as needed.   Mr. Celine Ahr is a 77 year old gentleman with a history of coronary artery  disease.  He stopped all of his medications about a month or so ago.  He was  admitted to the hospital on March 03, 2006 with episodes of chest  discomfort.   Please see dictated H&P for further details.   HOSPITAL COURSE BY PROBLEMS:  1. Chest pain.  The patient ruled out for myocardial infarction.  His EKG      remained unchanged.  The patient did not have any further problems and      is eager to go home.  He will usually take his home medications.      Specifically, he refuses to take a statin medication.  The patient      believes that __________ adding additives and processed foods from his      diet is perhaps better than reducing cholesterol.  The patient will      follow-up with Dr. Acie Fredrickson as noted above.  Will see him in 2-3 weeks.      I have asked him to call me sooner if he has any      problems.  I  have encouraged him to see Dr. Joylene Draft and a GI specialist      soon.  2. Hyperlipidemia.  The patient's cholesterol levels revealed an LDL of      169.  The patient refuses to take a statin medication.  We will      continue to follow him in the office.           ______________________________  Thayer Headings, M.D.     PJN/MEDQ  D:  03/04/2006  T:  03/04/2006  Job:  [id number]   cc:   Elta Guadeloupe A. Perini, M.D.

## 2010-10-06 NOTE — H&P (Signed)
NAME:  Marcus [name], HOMEIER NO.:  [id number]   MEDICAL RECORD NO.:  [id number]                   PATIENT TYPE:  OIB   LOCATION:                                       FACILITY:  [facility]   PHYSICIAN:  Ludwig Lean. Doreatha Lew, M.D.            DATE OF BIRTH:  [birth year]   DATE OF ADMISSION:  09/14/2003  DATE OF DISCHARGE:                                HISTORY & PHYSICAL   CHIEF COMPLAINT:  None.   HISTORY OF PRESENT ILLNESS:  Mr. Kuper is a 77 year old male who presents  for elective pacemaker implantation. He has had progressive bradycardia, now  with a heart rate of 44. Clinically, he has done well without episodes of  syncope or presyncope.  He has had no chest pain or shortness of breath.   PAST MEDICAL HISTORY:  1. Known atherosclerotic cardiovascular disease with previous history of     coronary artery bypass grafting in 1990 and subsequent PCIs performed to     the right coronary in 1997 as well as repeat angioplasty with re-     expansion of the stents placed in 1998 to the right coronary. His last     catheterization in October 2001 showing native coronary disease with     patent internal mammary to the LAD, vein graft to the obtuse marginal was     occluded, the previously mid right coronary artery was patent with minor     luminal irregularities.  2. Hyperlipidemia.  3. Hypertension.  4. Mild mitral regurgitation.  5. Status post appendectomy.   ALLERGIES:  None.   CURRENT MEDICATIONS:  1. Lipitor 10 mg daily.  2. Norvasc 5 mg daily.  3. Altace 2.5 mg daily.  4. Aspirin daily.   FAMILY HISTORY:  Father died in his early 48s due to alcoholism. Mother died  at age 76 with hypertension, heart disease, and cancer.   SOCIAL HISTORY:  He is retired. He has a remote history of tobacco use.   REVIEW OF SYSTEMS:  As noted above and otherwise unremarkable.   PHYSICAL EXAMINATION:  VITAL SIGNS: Weight 176 pounds, blood pressure 150/70  sitting and  140/70 standing, heart rate 44, respirations 18. He is afebrile.  SKIN: Warm and dry. Color is unremarkable.  LUNGS: Clear.  HEART: Irregular yet slow rhythm. There is no murmur.  ABDOMEN: Positive bowel sounds, nontender.  EXTREMITIES: Without edema.  NEUROLOGIC: Intact.   Pertinent labs are intact.   OVERALL IMPRESSION:  1. Progressive bradycardia.  2. Stable ischemic heart disease.  3. Hypertension.  4. Hyperlipidemia.   PLAN:  Will proceed on with elective pacemaker implantation. The procedure  has been reviewed in full detail including the risks and benefits, and he is  willing to proceed on Tuesday, September 14, 2003.      Doyle Askew, [place].P.  Ludwig Lean. Doreatha Lew, M.D.    LC/MEDQ  D:  09/08/2003  T:  09/08/2003  Job:  [id number]   cc:   Elta Guadeloupe A. Joylene Draft, M.D.  [address]  [place]  [place] [address]  Fax: [phone]   Thayer Headings, M.D.  [id number] [address] [place]  [place] [address]  Fax: [phone]

## 2010-10-06 NOTE — Discharge Summary (Signed)
NAME:  Marcus [name], BASCH NO.:  [id number]   MEDICAL RECORD NO.:  [id number]                   PATIENT TYPE:  OIB   LOCATION:  [id number]                                 FACILITY:  [facility]   PHYSICIAN:  Ludwig Lean. Doreatha Lew, M.D.            DATE OF BIRTH:  December 05, 1928   DATE OF ADMISSION:  09/14/2003  DATE OF DISCHARGE:  09/15/2003                                 DISCHARGE SUMMARY   PRIMARY DISCHARGE DIAGNOSIS:  Progressive bradycardia with subsequent  implantation of a DDDR Em Pulse 2D401, Serial Number [id number] H pacemaker.   SECONDARY DISCHARGE DIAGNOSES:  1. Nonischemic heart disease with previous bypass grafting in 1999 and     subsequently percutaneous coronary interventions with last     catheterization in October 2001.  2. Hyperlipidemia.  3. Hypertension.  4. Mild mitral regurgitation.   HISTORY OF PRESENT ILLNESS:  [name] is a 77 year old male who presents  for elective pacemaker implantation.  He has had no syncope or presyncope  type feelings.  He has had noted progressive bradycardia now with a heart  rate in the low 40s.  He has had no chest pain, no shortness of breath.   Please see the dictated History and Physical for further patient  presentation and profile.   </LABORATORY DATA ON ADMISSION>  Chemistries were normal except for BUN of 26.  Creatinine was 1.3.  Potassium was 5.4.  PT and PTT were unremarkable. CBC was  within normal  limits.   HOSPITAL COURSE:  The patient was admitted electively in order to undergo  pacemaker implantation.  That procedure was performed without complications.  An En Pulse [id number] dual-chamber pacemaker was implanted, Serial Number  [id number].  That procedure was tolerated well, and afterwards he was  transferred to telemetry.  Today, on April 27, 2005l he is doing well  without complaints.  Pacemaker interrogation is satisfactory.  The wound is  satisfactory, and he is a stable candidate for discharge  today.   CONDITION ON DISCHARGE:  Stable.   DISCHARGE MEDICATIONS:  He will resume all his previous medicines as he was  taking before.  He may use Tylenol for discomfort.   DISCHARGE INSTRUCTIONS:  Extensive written instructions are given regarding  pacemaker care, specifically not to raise the right arm above his head for  the next two weeks.  He is also to avoid getting the wound wet for the first  week as well.  Betadine swabs are made available for him to paint the wound  twice a day for the next three days.  He is to see Dr. Acie Fredrickson in followup in  approximately one week, certainly sooner if problems arise.      Doyle Askew, N.P.                     Ludwig Lean. Doreatha Lew, M.D.    LC/MEDQ  D:  09/15/2003  T:  09/15/2003  Job:  [id number]   cc:   Thayer Headings, M.D.  [address], [place] [address]  Fax: [phone]   Jeannette How. Joylene Draft, M.D.  [address]  [place]  [place] [address]  Fax: [phone]

## 2010-12-29 ENCOUNTER — Other Ambulatory Visit: Payer: Self-pay | Admitting: Dermatology

## 2011-02-10 ENCOUNTER — Encounter: Payer: Self-pay | Admitting: Cardiovascular Disease

## 2011-02-23 LAB — CK TOTAL AND CKMB (NOT AT ARMC)
CK, MB: 1 ng/mL (ref 0.3–4.0)
Relative Index: INVALID (ref 0.0–2.5)
Total CK: 48 U/L (ref 7–232)

## 2011-02-23 LAB — CBC
HCT: 32.7 % — ABNORMAL LOW (ref 39.0–52.0)
HCT: 34.1 % — ABNORMAL LOW (ref 39.0–52.0)
Hemoglobin: 10.9 g/dL — ABNORMAL LOW (ref 13.0–17.0)
Hemoglobin: 11.5 g/dL — ABNORMAL LOW (ref 13.0–17.0)
Hemoglobin: 11.6 g/dL — ABNORMAL LOW (ref 13.0–17.0)
MCHC: 33.6 g/dL (ref 30.0–36.0)
Platelets: 286 10*3/uL (ref 150–400)
Platelets: 312 10*3/uL (ref 150–400)
RBC: 3.4 MIL/uL — ABNORMAL LOW (ref 4.22–5.81)
RBC: 3.4 MIL/uL — ABNORMAL LOW (ref 4.22–5.81)
RDW: 14.3 % (ref 11.5–15.5)
RDW: 14.6 % (ref 11.5–15.5)
WBC: 6.5 10*3/uL (ref 4.0–10.5)
WBC: 7.1 10*3/uL (ref 4.0–10.5)

## 2011-02-23 LAB — DIFFERENTIAL
Basophils Absolute: 0 10*3/uL (ref 0.0–0.1)
Eosinophils Relative: 4 % (ref 0–5)
Lymphocytes Relative: 11 % — ABNORMAL LOW (ref 12–46)
Monocytes Absolute: 0.5 10*3/uL (ref 0.1–1.0)

## 2011-02-23 LAB — BASIC METABOLIC PANEL
BUN: 18 mg/dL (ref 6–23)
BUN: 19 mg/dL (ref 6–23)
BUN: 20 mg/dL (ref 6–23)
CO2: 23 mEq/L (ref 19–32)
CO2: 26 mEq/L (ref 19–32)
Calcium: 8.5 mg/dL (ref 8.4–10.5)
Calcium: 8.8 mg/dL (ref 8.4–10.5)
Chloride: 102 mEq/L (ref 96–112)
Creatinine, Ser: 1.06 mg/dL (ref 0.4–1.5)
Creatinine, Ser: 1.18 mg/dL (ref 0.4–1.5)
Creatinine, Ser: 1.24 mg/dL (ref 0.4–1.5)
GFR calc Af Amer: >60 mL/min (ref >60)
GFR calc non Af Amer: 60 mL/min — ABNORMAL LOW (ref >60)
GFR calc non Af Amer: 60 mL/min — ABNORMAL LOW (ref >60)
GFR calc non Af Amer: >60 mL/min (ref >60)
GFR calc non Af Amer: >60 mL/min (ref >60)
Glucose, Bld: 111 mg/dL — ABNORMAL HIGH (ref 70–99)
Glucose, Bld: 94 mg/dL (ref 70–99)
Potassium: 3.8 mEq/L (ref 3.5–5.1)
Potassium: 4.2 mEq/L (ref 3.5–5.1)
Sodium: 132 mEq/L — ABNORMAL LOW (ref 135–145)
Sodium: 135 mEq/L (ref 135–145)
Sodium: 135 mEq/L (ref 135–145)

## 2011-02-23 LAB — CARDIAC PANEL(CRET KIN+CKTOT+MB+TROPI)
CK, MB: 0.7 ng/mL (ref 0.3–4.0)
Relative Index: INVALID (ref 0.0–2.5)
Total CK: 28 U/L (ref 7–232)
Troponin I: 0.84 ng/mL (ref 0.00–0.06)

## 2011-02-23 LAB — B-NATRIURETIC PEPTIDE (CONVERTED LAB)
Pro B Natriuretic peptide (BNP): 1024 pg/mL — ABNORMAL HIGH (ref 0.0–100.0)
Pro B Natriuretic peptide (BNP): 1164 pg/mL — ABNORMAL HIGH (ref 0.0–100.0)

## 2011-02-23 LAB — POCT I-STAT, CHEM 8
BUN: 23 mg/dL (ref 6–23)
Calcium, Ion: 1.15 mmol/L (ref 1.12–1.32)
Creatinine, Ser: 1.3 mg/dL (ref 0.4–1.5)
TCO2: 26 mmol/L (ref 0–100)

## 2011-02-23 LAB — PROTIME-INR
INR: 1.2 (ref 0.00–1.49)
Prothrombin Time: 16 seconds — ABNORMAL HIGH (ref 11.6–15.2)

## 2011-02-23 LAB — TROPONIN I: Troponin I: 1.06 ng/mL (ref 0.00–0.06)

## 2011-02-23 LAB — APTT: aPTT: 42 seconds — ABNORMAL HIGH (ref 24–37)

## 2011-02-28 ENCOUNTER — Encounter: Payer: Self-pay | Admitting: Cardiovascular Disease

## 2011-02-28 ENCOUNTER — Ambulatory Visit (INDEPENDENT_AMBULATORY_CARE_PROVIDER_SITE_OTHER): Payer: Medicare Other | Admitting: Cardiovascular Disease

## 2011-02-28 ENCOUNTER — Ambulatory Visit (INDEPENDENT_AMBULATORY_CARE_PROVIDER_SITE_OTHER): Payer: Medicare Other | Admitting: *Deleted

## 2011-02-28 DIAGNOSIS — I10 Essential (primary) hypertension: Secondary | ICD-10-CM

## 2011-02-28 DIAGNOSIS — I2510 Atherosclerotic heart disease of native coronary artery without angina pectoris: Secondary | ICD-10-CM

## 2011-02-28 DIAGNOSIS — I4891 Unspecified atrial fibrillation: Secondary | ICD-10-CM

## 2011-02-28 LAB — BASIC METABOLIC PANEL
Calcium: 9.2 mg/dL (ref 8.4–10.5)
GFR: 69.18 mL/min (ref >60.00)
Glucose, Bld: 101 mg/dL — ABNORMAL HIGH (ref 70–99)
Sodium: 141 mEq/L (ref 135–145)

## 2011-02-28 LAB — HEPATIC FUNCTION PANEL
Albumin: 4.2 g/dL (ref 3.5–5.2)
Alkaline Phosphatase: 46 U/L (ref 39–117)
Bilirubin, Direct: 0.1 mg/dL (ref 0.0–0.3)

## 2011-02-28 LAB — LIPID PANEL
HDL: 77.4 mg/dL (ref >39.00)
LDL Cholesterol: 90 mg/dL (ref 0–99)
Total CHOL/HDL Ratio: 2
VLDL: 7.4 mg/dL (ref 0.0–40.0)

## 2011-02-28 NOTE — Assessment & Plan Note (Addendum)
His atrial fibrillation is stable. He is atrially paced today. He does not want to take Coumadin. Will continue to follow him.

## 2011-02-28 NOTE — Assessment & Plan Note (Signed)
Patient has not had any angina. We'll continue the same medications.

## 2011-02-28 NOTE — Progress Notes (Signed)
Marcus [name] Date of Birth  08-17-1928 [facility] [id number] [address]    [place], [place]  [address] [phone]  Fax  [phone]  History of Present Illness:  Marcus [name] is an 77 year old gentleman with a history of coronary artery disease. He status post coronary artery bypass grafting. He status post PTCA and stenting of his right coronary artery. He is also status post PTCA and stenting of the acute marginal artery and his protected left main.    He also has a history of hypercholesterolemia. He refuses to take statin medications.  His BP measurements have been fairly well controlled. He's on very strict diet. He exercises regularly.  He also has a history of prostate cancer.  He's not had any therapy for this yet.  He also has a history of pacemaker implantation. He has a history of sick sinus syndrome with atrial fibrillation.  He refuses to take Coumadin.    He takes a lot of supplements. He eats a very low carbohydrate diet.  He has absolutely no complaints.  Current Outpatient Prescriptions on File Prior to Visit  Medication Sig Dispense Refill  . Ascorbic Acid (VITAMIN C PO) Take by mouth daily.        Marland Kitchen CALCIUM PO Take by mouth daily.        . Cholecalciferol (VITAMIN D PO) Take by mouth daily.        . Coenzyme Q10 (COQ10 PO) Take by mouth daily.        . fish oil-omega-3 fatty acids 1000 MG capsule Take 1 g by mouth daily.        Marland Kitchen KRILL OIL PO Take by mouth daily.        Marland Kitchen LYCOPENE PO Take by mouth daily.        . niacin (NIASPAN) 500 MG CR tablet Take 2,000 mg by mouth at bedtime.        . Selenium 200 MCG CAPS Take by mouth daily.        Marland Kitchen VITAMIN E PO Take by mouth daily.          No Known Allergies  Past Medical History  Diagnosis Date  . Coronary artery disease   . Hyperlipidemia   . Prostate cancer   . Intermittent atrial fibrillation     Past Surgical History  Procedure Date  . Cardiac catheterization 05/07/2008  . Coronary artery  bypass graft   . Insert / replace / remove pacemaker   . Coronary angioplasty with stent placement     OBTUSE MARGINAL ARTERY AND LEFT MAIN  . Cardiovascular stress test   . Doppler echocardiography 06/24/2002    EF 60-65%    History  Smoking status  . Former Smoker  . Quit date: 02/10/1971  Smokeless tobacco  . Not on file    History  Alcohol Use No    Family History  Problem Relation Age of Onset  . Hypertension Mother   . Cancer Mother   . Alcohol abuse Father     Reviw of Systems:  Reviewed in the HPI.  All other systems are negative.  Physical Exam: BP 148/87  Pulse 70  Ht 5\' 10"  (1.778 m)  Wt 145 lb 12.8 oz (66.134 kg)  BMI 20.92 kg/m2 The patient is alert and oriented x 3.  The mood and affect are normal.   Skin: warm and dry.  Color is normal.    HEENT:   the sclera are nonicteric.  The mucous membranes are  moist.  The carotids are 2+ without bruits.  There is no thyromegaly.  There is no JVD.    Lungs: clear.  The chest wall is non tender.    Heart: regular rate with a normal S1 and S2 with frequent premature contractions consistent with bigeminy. There are no murmurs, gallops, or rubs. The PMI is not displaced.     Abdomen: good bowel sounds.  There is no guarding or rebound.  There is no hepatosplenomegaly or tenderness.  There are no masses.   Extremities:  no clubbing, cyanosis, or edema.  The legs are without rashes.  The distal pulses are intact.   Neuro:  Cranial nerves II - XII are intact.  Motor and sensory functions are intact.    The gait is normal.  ECG: Atrial pacing.    Assessment / Plan:

## 2011-02-28 NOTE — Assessment & Plan Note (Signed)
His blood pressure is a little bit elevated today. He takes his blood pressure occasionally at home and has never had any significant elevated blood pressures. I suspect that this is due from coming into the office today. He eats a low fat and low salt diet. We'll continue to have him eat a good diet and exercise on regular basis

## 2011-02-28 NOTE — Patient Instructions (Addendum)
Your physician wants you to follow-up in: 6 months You will receive a reminder letter in the mail two months in advance. If you don't receive a letter, please call our office to schedule the follow-up appointment.  Your physician recommends that you return for a FASTING lipid profile: 6 months  Need appointment for f/u with Dr Caryl Comes or Dr Lovena Le

## 2011-03-07 ENCOUNTER — Telehealth: Payer: Self-pay | Admitting: *Deleted

## 2011-03-07 NOTE — Telephone Encounter (Signed)
Advised patient of labs on recorder

## 2011-03-07 NOTE — Telephone Encounter (Signed)
Message copied by Deniece Ree on Wed Mar 07, 2011  9:10 AM ------      Message from: Thayer Headings      Created: Mon Mar 05, 2011  5:23 AM       Labs are ok.  Pt refuses to take a statin.  LDL of 90 is actually pretty good for not being on a statin.  Continue same diet. Recheck in 6 months

## 2011-04-19 ENCOUNTER — Encounter: Payer: Medicare Other | Admitting: Internal Medicine

## 2011-04-20 ENCOUNTER — Telehealth: Payer: Self-pay | Admitting: Internal Medicine

## 2011-04-20 NOTE — Telephone Encounter (Signed)
New Msg: Pt wife to calling back to rs appt with Dr. Lovena Le. Soonest available appt is 06/12/11. Pt wife thinks that is too long to wait. Please return pt wife call to discuss further.

## 2011-04-20 NOTE — Telephone Encounter (Signed)
Spoke with pt's wife.  Pt had appt set up to see Dr. Lovena Le this past week and our office had to cancel this with plans to reschedule. Wife is calling to reschedule.  I told her I would send message to [facility] to contact her with appt time.  Wife asks that we speak with either her or pt when calling to reschedule.

## 2011-04-24 NOTE — Telephone Encounter (Signed)
Will schedule for the last week in Dec  Melissa to call the patient

## 2011-05-04 ENCOUNTER — Other Ambulatory Visit: Payer: Self-pay | Admitting: Dermatology

## 2011-05-18 ENCOUNTER — Encounter: Payer: Self-pay | Admitting: Internal Medicine

## 2011-05-18 ENCOUNTER — Ambulatory Visit (INDEPENDENT_AMBULATORY_CARE_PROVIDER_SITE_OTHER): Payer: Medicare Other | Admitting: Internal Medicine

## 2011-05-18 VITALS — BP 140/68 | HR 60 | Ht 70.0 in | Wt 150.0 lb

## 2011-05-18 DIAGNOSIS — I495 Sick sinus syndrome: Secondary | ICD-10-CM

## 2011-05-18 DIAGNOSIS — I4891 Unspecified atrial fibrillation: Secondary | ICD-10-CM

## 2011-05-18 DIAGNOSIS — I10 Essential (primary) hypertension: Secondary | ICD-10-CM

## 2011-05-18 DIAGNOSIS — I2510 Atherosclerotic heart disease of native coronary artery without angina pectoris: Secondary | ICD-10-CM

## 2011-05-18 LAB — PACEMAKER DEVICE OBSERVATION
BAMS-0001: 175 {beats}/min
BATTERY VOLTAGE: 2.72 V
RV LEAD AMPLITUDE: 11.2 mv

## 2011-05-18 NOTE — Progress Notes (Signed)
HPI Mr. Basom returns today for PPM followup. He is a pleasant 77 yo man with a h/o CAD, s/p CABG, symptomatic bradycardia s/p PPM. He denies c/p, sob, or peripheral edema. No syncope.  No Known Allergies   Current Outpatient Prescriptions  Medication Sig Dispense Refill  . Ascorbic Acid (VITAMIN C PO) Take by mouth daily.        Marland Kitchen CALCIUM PO Take by mouth daily.        . Cholecalciferol (VITAMIN D PO) Take by mouth daily.        . Coenzyme Q10 (COQ10 PO) Take by mouth daily.        . fish oil-omega-3 fatty acids 1000 MG capsule Take 1 g by mouth daily.        Marland Kitchen KRILL OIL PO Take by mouth daily.        Marland Kitchen LYCOPENE PO Take by mouth daily.        . niacin (NIASPAN) 500 MG CR tablet Take 2,000 mg by mouth at bedtime.        . Selenium 200 MCG CAPS Take by mouth daily.        Marland Kitchen VITAMIN E PO Take by mouth daily.           Past Medical History  Diagnosis Date  . Coronary artery disease   . Hyperlipidemia   . Prostate cancer   . Intermittent atrial fibrillation     ROS:   All systems reviewed and negative except as noted in the HPI.   Past Surgical History  Procedure Date  . Cardiac catheterization 05/07/2008  . Coronary artery bypass graft   . Insert / replace / remove pacemaker   . Coronary angioplasty with stent placement     Stent to RCA and OBTUSE MARGINAL ARTERY AND LEFT MAIN  . Doppler echocardiography 06/24/2002    EF 60-65%     Family History  Problem Relation Age of Onset  . Hypertension Mother   . Cancer Mother   . Alcohol abuse Father      History   Social History  . Marital Status: Married    Spouse Name: [name]    Number of Children: [name]  . Years of Education: [name]   Occupational History  . Not on file.   Social History Main Topics  . Smoking status: Former Smoker    Quit date: 02/10/1971  . Smokeless tobacco: Not on file  . Alcohol Use: No  . Drug Use: No  . Sexually Active:    Other Topics Concern  . Not on file   Social History  Narrative  . No narrative on file     BP 140/68  Pulse 60  Ht 5\' 10"  (1.778 m)  Wt 68.04 kg (150 lb)  BMI 21.52 kg/m2  Physical Exam:  Well appearing NAD HEENT: Unremarkable Neck:  No JVD, no thyromegally Lymphatics:  No adenopathy Back:  No CVA tenderness Lungs:  Clear with no wheezes. HEART:  Regular rate rhythm, no murmurs, no rubs, no clicks Abd:  soft, positive bowel sounds, no organomegally, no rebound, no guarding Ext:  2 plus pulses, no edema, no cyanosis, no clubbing Skin:  No rashes no nodules Neuro:  CN II through XII intact, motor grossly intact  DEVICE  Normal device function.  See PaceArt for details.   Assess/Plan:

## 2011-05-18 NOTE — Assessment & Plan Note (Signed)
The patient has very brief eipsodes of atrial fib lasting less than 30 minutes. He is asymptomatic. When I discussed that he might some day need anti-coagulation. He stated "there is no way in hell I am going to take precription blood thinners no matter what!" He will continue watchful waiting.

## 2011-05-18 NOTE — Assessment & Plan Note (Signed)
He will continue a low sodium diet. He refuses any meds for this problem.

## 2011-05-18 NOTE — Assessment & Plan Note (Signed)
He denies anginal symptoms. He will continue his current activity. He refuses beta blockers, ACE inhibitors or any other meds for CAD.

## 2011-09-05 DIAGNOSIS — C61 Malignant neoplasm of prostate: Secondary | ICD-10-CM | POA: Diagnosis not present

## 2011-09-12 DIAGNOSIS — R972 Elevated prostate specific antigen [PSA]: Secondary | ICD-10-CM | POA: Diagnosis not present

## 2011-09-12 DIAGNOSIS — C61 Malignant neoplasm of prostate: Secondary | ICD-10-CM | POA: Diagnosis not present

## 2011-09-12 DIAGNOSIS — N400 Enlarged prostate without lower urinary tract symptoms: Secondary | ICD-10-CM | POA: Diagnosis not present

## 2011-12-11 ENCOUNTER — Telehealth: Payer: Self-pay | Admitting: Internal Medicine

## 2011-12-11 NOTE — Telephone Encounter (Signed)
12-11-11 lmm @ 219pm for pt to set up past due pacer ck with brooke or device/mt

## 2012-01-09 ENCOUNTER — Ambulatory Visit (INDEPENDENT_AMBULATORY_CARE_PROVIDER_SITE_OTHER): Payer: Medicare Other | Admitting: *Deleted

## 2012-01-09 ENCOUNTER — Encounter: Payer: Self-pay | Admitting: Internal Medicine

## 2012-01-09 DIAGNOSIS — I495 Sick sinus syndrome: Secondary | ICD-10-CM | POA: Diagnosis not present

## 2012-01-09 DIAGNOSIS — I4891 Unspecified atrial fibrillation: Secondary | ICD-10-CM | POA: Diagnosis not present

## 2012-01-09 LAB — PACEMAKER DEVICE OBSERVATION
AL IMPEDENCE PM: 481 Ohm
AL THRESHOLD: 0.5 V
BATTERY VOLTAGE: 2.68 V
RV LEAD IMPEDENCE PM: 463 Ohm
RV LEAD THRESHOLD: 0.5 V

## 2012-01-09 NOTE — Progress Notes (Signed)
Pacer check in clinic  

## 2012-02-01 ENCOUNTER — Encounter: Payer: Self-pay | Admitting: *Deleted

## 2012-02-01 DIAGNOSIS — Z950 Presence of cardiac pacemaker: Secondary | ICD-10-CM | POA: Insufficient documentation

## 2012-02-11 ENCOUNTER — Ambulatory Visit (INDEPENDENT_AMBULATORY_CARE_PROVIDER_SITE_OTHER): Payer: Medicare Other | Admitting: *Deleted

## 2012-02-11 ENCOUNTER — Encounter: Payer: Self-pay | Admitting: Internal Medicine

## 2012-02-11 DIAGNOSIS — I4891 Unspecified atrial fibrillation: Secondary | ICD-10-CM

## 2012-02-11 DIAGNOSIS — I495 Sick sinus syndrome: Secondary | ICD-10-CM | POA: Diagnosis not present

## 2012-02-11 LAB — PACEMAKER DEVICE OBSERVATION
AL THRESHOLD: 0.5 V
ATRIAL PACING PM: 98
BAMS-0001: 175 {beats}/min
BATTERY VOLTAGE: 2.67 V
RV LEAD AMPLITUDE: 22.4 mv
RV LEAD THRESHOLD: 0.5 V
VENTRICULAR PACING PM: 39

## 2012-02-11 NOTE — Progress Notes (Signed)
Pacer check in clinic  

## 2012-02-12 ENCOUNTER — Telehealth: Payer: Self-pay | Admitting: Internal Medicine

## 2012-02-12 NOTE — Telephone Encounter (Signed)
Pt's transmitter is different than what he said you explained to him yesterday and has a question  pls call [phone]

## 2012-02-12 NOTE — Telephone Encounter (Signed)
LMOM/kwm

## 2012-02-14 NOTE — Telephone Encounter (Signed)
Enrolled pt in [facility] and pt aware transmitter will arrive in next 2-3 weeks. Pt has remote check 03-10-12/kwm

## 2012-03-10 ENCOUNTER — Ambulatory Visit (INDEPENDENT_AMBULATORY_CARE_PROVIDER_SITE_OTHER): Payer: Medicare Other | Admitting: *Deleted

## 2012-03-10 DIAGNOSIS — Z950 Presence of cardiac pacemaker: Secondary | ICD-10-CM | POA: Diagnosis not present

## 2012-03-10 DIAGNOSIS — I4891 Unspecified atrial fibrillation: Secondary | ICD-10-CM

## 2012-03-14 LAB — REMOTE PACEMAKER DEVICE
AL IMPEDENCE PM: 461 Ohm
BATTERY VOLTAGE: 2.67 V
RV LEAD AMPLITUDE: 22.4 mv
RV LEAD IMPEDENCE PM: 449 Ohm
VENTRICULAR PACING PM: 31

## 2012-03-19 DIAGNOSIS — C61 Malignant neoplasm of prostate: Secondary | ICD-10-CM | POA: Diagnosis not present

## 2012-03-20 ENCOUNTER — Encounter: Payer: Self-pay | Admitting: *Deleted

## 2012-03-24 ENCOUNTER — Encounter: Payer: Self-pay | Admitting: Internal Medicine

## 2012-05-05 ENCOUNTER — Encounter: Payer: Self-pay | Admitting: Internal Medicine

## 2012-05-07 ENCOUNTER — Ambulatory Visit (INDEPENDENT_AMBULATORY_CARE_PROVIDER_SITE_OTHER): Payer: Medicare Other | Admitting: Internal Medicine

## 2012-05-07 ENCOUNTER — Encounter: Payer: Self-pay | Admitting: Internal Medicine

## 2012-05-07 VITALS — BP 151/90 | HR 90 | Ht 70.0 in | Wt 148.0 lb

## 2012-05-07 DIAGNOSIS — I495 Sick sinus syndrome: Secondary | ICD-10-CM

## 2012-05-07 DIAGNOSIS — I4891 Unspecified atrial fibrillation: Secondary | ICD-10-CM

## 2012-05-07 DIAGNOSIS — Z950 Presence of cardiac pacemaker: Secondary | ICD-10-CM

## 2012-05-07 LAB — PACEMAKER DEVICE OBSERVATION
AL THRESHOLD: 0.5 V
ATRIAL PACING PM: 97.6
BATTERY VOLTAGE: 2.64 V

## 2012-05-07 NOTE — Progress Notes (Signed)
HPI Marcus [name] returns today for followup. He is a very pleasant 78 year old man with a history of symptomatic bradycardia, status post permanent pacemaker insertion. In the interim, he has done well. He denies chest pain or shortness of breath. No syncope. He remains active, working in his yard. No Known Allergies   Current Outpatient Prescriptions  Medication Sig Dispense Refill  . Ascorbic Acid (VITAMIN C PO) Take by mouth daily.        Marland Kitchen aspirin 81 MG tablet Take 81 mg by mouth daily.      Marland Kitchen CALCIUM PO Take by mouth daily.        . Cholecalciferol (VITAMIN D PO) Take by mouth daily.        . Coenzyme Q10 (COQ10 PO) Take by mouth daily.        . fish oil-omega-3 fatty acids 1000 MG capsule Take 1 g by mouth daily.        Marland Kitchen KRILL OIL PO Take by mouth daily.        Marland Kitchen LYCOPENE PO Take by mouth daily.        . niacin (NIASPAN) 500 MG CR tablet Take 2,000 mg by mouth at bedtime.        . Selenium 200 MCG CAPS Take by mouth daily.        Marland Kitchen VITAMIN E PO Take by mouth daily.           Past Medical History  Diagnosis Date  . Coronary artery disease   . Hyperlipidemia   . Prostate cancer   . Intermittent atrial fibrillation     ROS:   All systems reviewed and negative except as noted in the HPI.   Past Surgical History  Procedure Date  . Cardiac catheterization 05/07/2008  . Coronary artery bypass graft   . Insert / replace / remove pacemaker   . Coronary angioplasty with stent placement     Stent to RCA and OBTUSE MARGINAL ARTERY AND LEFT MAIN  . Doppler echocardiography 06/24/2002    EF 60-65%     Family History  Problem Relation Age of Onset  . Hypertension Mother   . Cancer Mother   . Alcohol abuse Father      History   Social History  . Marital Status: Married    Spouse Name: [name]    Number of Children: [name]  . Years of Education: [name]   Occupational History  . Not on file.   Social History Main Topics  . Smoking status: Former Smoker    Quit date:  02/10/1971  . Smokeless tobacco: Not on file  . Alcohol Use: No  . Drug Use: No  . Sexually Active:    Other Topics Concern  . Not on file   Social History Narrative  . No narrative on file     BP 151/90  Pulse 90  Ht 5\' 10"  (1.778 m)  Wt 148 lb (67.132 kg)  BMI 21.24 kg/m2  Physical Exam:  Well appearing 78 year old man, NAD HEENT: Unremarkable Neck:  No JVD, no thyromegally Lungs:  Clear with no wheezes, rales, or rhonchi. HEART:  Regular rate rhythm, no murmurs, no rubs, no clicks Abd:  soft, positive bowel sounds, no organomegally, no rebound, no guarding Ext:  2 plus pulses, no edema, no cyanosis, no clubbing Skin:  No rashes no nodules Neuro:  CN II through XII intact, motor grossly intact  EKG normal sinus rhythm with atrial pacing  DEVICE  Normal device function.  See  PaceArt for details. He is approaching elective replacement  Assess/Plan:

## 2012-05-07 NOTE — Patient Instructions (Signed)
Remote monitoring is used to monitor your Pacemaker of ICD from home. This monitoring reduces the number of office visits required to check your device to one time per year. It allows [place] to keep an eye on the functioning of your device to ensure it is working properly. You are scheduled for a device check from home on August 11, 2012. You may send your transmission at any time that day. If you have a wireless device, the transmission will be sent automatically. After your physician reviews your transmission, you will receive a postcard with your next transmission date.

## 2012-05-07 NOTE — Assessment & Plan Note (Signed)
His Medtronic dual-chamber pacemaker is working normally but approaching elective replacement. We'll plan to recheck in several months.

## 2012-05-22 ENCOUNTER — Telehealth: Payer: Self-pay | Admitting: Internal Medicine

## 2012-05-22 NOTE — Telephone Encounter (Signed)
New problem:  C/O chest discomfort as well in his back. Wife is asking for him to be seen today if possible.

## 2012-05-22 NOTE — Telephone Encounter (Signed)
Spoke with wife/ pt in shower. Pt c/o chest discomfort that woke him at 4 am. Described by wife as mid sternum pain with c/o back pain that was between his shoulder blades. C/o ankle swelling also. Denies worsening pain or changes, pain stays consistent. Denies nausea, belching, pt request ov today, no office hours for Dr Acie Fredrickson this week, none available for NP or PA. Advised going to ER, pt declines per wife because he is fearful they may keep him, re advised why ER was most appropriate for him but also said to be seen by [facility] walk in clinic if he would feel better about that and would go, also suggested [place] [facility]. Wife will try to get him to go and was accepting of advise.

## 2012-05-24 ENCOUNTER — Emergency Department (HOSPITAL_COMMUNITY): Payer: Medicare Other

## 2012-05-24 ENCOUNTER — Inpatient Hospital Stay (HOSPITAL_COMMUNITY)
Admission: EM | Admit: 2012-05-24 | Discharge: 2012-05-29 | DRG: 291 | Disposition: A | Discharge status: Home / Self Care | Payer: Medicare Other | Attending: Internal Medicine | Admitting: Internal Medicine

## 2012-05-24 ENCOUNTER — Encounter (HOSPITAL_COMMUNITY): Payer: Self-pay | Admitting: Neurology

## 2012-05-24 DIAGNOSIS — Z7982 Long term (current) use of aspirin: Secondary | ICD-10-CM | POA: Diagnosis not present

## 2012-05-24 DIAGNOSIS — I4891 Unspecified atrial fibrillation: Secondary | ICD-10-CM | POA: Diagnosis not present

## 2012-05-24 DIAGNOSIS — Z951 Presence of aortocoronary bypass graft: Secondary | ICD-10-CM

## 2012-05-24 DIAGNOSIS — E785 Hyperlipidemia, unspecified: Secondary | ICD-10-CM | POA: Diagnosis present

## 2012-05-24 DIAGNOSIS — I2589 Other forms of chronic ischemic heart disease: Secondary | ICD-10-CM | POA: Diagnosis present

## 2012-05-24 DIAGNOSIS — Z87891 Personal history of nicotine dependence: Secondary | ICD-10-CM

## 2012-05-24 DIAGNOSIS — Z79899 Other long term (current) drug therapy: Secondary | ICD-10-CM | POA: Diagnosis not present

## 2012-05-24 DIAGNOSIS — E876 Hypokalemia: Secondary | ICD-10-CM | POA: Diagnosis present

## 2012-05-24 DIAGNOSIS — J90 Pleural effusion, not elsewhere classified: Secondary | ICD-10-CM | POA: Diagnosis not present

## 2012-05-24 DIAGNOSIS — Z950 Presence of cardiac pacemaker: Secondary | ICD-10-CM | POA: Diagnosis not present

## 2012-05-24 DIAGNOSIS — I5023 Acute on chronic systolic (congestive) heart failure: Principal | ICD-10-CM | POA: Diagnosis present

## 2012-05-24 DIAGNOSIS — I2510 Atherosclerotic heart disease of native coronary artery without angina pectoris: Secondary | ICD-10-CM | POA: Diagnosis present

## 2012-05-24 DIAGNOSIS — I10 Essential (primary) hypertension: Secondary | ICD-10-CM

## 2012-05-24 DIAGNOSIS — Z8546 Personal history of malignant neoplasm of prostate: Secondary | ICD-10-CM | POA: Diagnosis not present

## 2012-05-24 DIAGNOSIS — I509 Heart failure, unspecified: Secondary | ICD-10-CM | POA: Diagnosis present

## 2012-05-24 DIAGNOSIS — J811 Chronic pulmonary edema: Secondary | ICD-10-CM | POA: Diagnosis not present

## 2012-05-24 DIAGNOSIS — R0602 Shortness of breath: Secondary | ICD-10-CM | POA: Diagnosis not present

## 2012-05-24 DIAGNOSIS — J189 Pneumonia, unspecified organism: Secondary | ICD-10-CM | POA: Diagnosis not present

## 2012-05-24 HISTORY — DX: Bradycardia, unspecified: R00.1

## 2012-05-24 HISTORY — DX: Ischemic cardiomyopathy: I25.5

## 2012-05-24 HISTORY — DX: Paroxysmal atrial fibrillation: I48.0

## 2012-05-24 LAB — COMPREHENSIVE METABOLIC PANEL
AST: 29 U/L (ref 0–37)
Albumin: 3.4 g/dL — ABNORMAL LOW (ref 3.5–5.2)
Alkaline Phosphatase: 57 U/L (ref 39–117)
Chloride: 98 mEq/L (ref 96–112)
Potassium: 4.1 mEq/L (ref 3.5–5.1)
Sodium: 136 mEq/L (ref 135–145)
Total Bilirubin: 1.3 mg/dL — ABNORMAL HIGH (ref 0.3–1.2)

## 2012-05-24 LAB — CBC WITH DIFFERENTIAL/PLATELET
Basophils Absolute: 0 10*3/uL (ref 0.0–0.1)
Basophils Relative: 0 % (ref 0–1)
Hemoglobin: 12.6 g/dL — ABNORMAL LOW (ref 13.0–17.0)
MCHC: 33.5 g/dL (ref 30.0–36.0)
Neutro Abs: 6.4 10*3/uL (ref 1.7–7.7)
Neutrophils Relative %: 84 % — ABNORMAL HIGH (ref 43–77)
Platelets: 97 10*3/uL — ABNORMAL LOW (ref 150–400)
RDW: 14.6 % (ref 11.5–15.5)

## 2012-05-24 LAB — URINALYSIS, ROUTINE W REFLEX MICROSCOPIC
Glucose, UA: NEGATIVE mg/dL
Hgb urine dipstick: NEGATIVE
Ketones, ur: 15 mg/dL — AB
Protein, ur: 30 mg/dL — AB
pH: 5.5 (ref 5.0–8.0)

## 2012-05-24 LAB — PRO B NATRIURETIC PEPTIDE: Pro B Natriuretic peptide (BNP): 8552 pg/mL — ABNORMAL HIGH (ref 0–450)

## 2012-05-24 LAB — CBC
HCT: 37 % — ABNORMAL LOW (ref 39.0–52.0)
MCH: 31.3 pg (ref 26.0–34.0)
MCHC: 33.2 g/dL (ref 30.0–36.0)
RDW: 14.6 % (ref 11.5–15.5)

## 2012-05-24 LAB — PROTIME-INR
INR: 1.14 (ref 0.00–1.49)
Prothrombin Time: 14.4 seconds (ref 11.6–15.2)

## 2012-05-24 LAB — POCT I-STAT TROPONIN I

## 2012-05-24 LAB — URINE MICROSCOPIC-ADD ON

## 2012-05-24 LAB — CREATININE, SERUM: GFR calc non Af Amer: 58 mL/min — ABNORMAL LOW (ref >90)

## 2012-05-24 MED ORDER — ZOLPIDEM TARTRATE 5 MG PO TABS: 5.0000 mg | ORAL_TABLET | Freq: Every evening | ORAL | Status: DC | PRN

## 2012-05-24 MED ORDER — ASPIRIN 81 MG PO TABS: 81.0000 mg | ORAL_TABLET | Freq: Every day | ORAL | Status: DC

## 2012-05-24 MED ORDER — OMEGA-3-ACID ETHYL ESTERS 1 G PO CAPS
1.0000 g | ORAL_CAPSULE | Freq: Every day | ORAL | Status: DC
  Administered 2012-05-24 – 2012-05-29 (×6): 1 g via ORAL
  Filled 2012-05-24 (×6): qty 1

## 2012-05-24 MED ORDER — POTASSIUM CHLORIDE CRYS ER 20 MEQ PO TBCR
20.0000 meq | EXTENDED_RELEASE_TABLET | Freq: Two times a day (BID) | ORAL | Status: DC
  Administered 2012-05-24 – 2012-05-26 (×5): 20 meq via ORAL
  Filled 2012-05-24 (×7): qty 1

## 2012-05-24 MED ORDER — SODIUM CHLORIDE 0.9 % IJ SOLN: 3.0000 mL | INTRAMUSCULAR | Status: DC | PRN

## 2012-05-24 MED ORDER — FUROSEMIDE 10 MG/ML IJ SOLN
80.0000 mg | Freq: Once | INTRAMUSCULAR | Status: AC
  Administered 2012-05-24: 80 mg via INTRAVENOUS
  Filled 2012-05-24: qty 8

## 2012-05-24 MED ORDER — SODIUM CHLORIDE 0.9 % IV SOLN
Freq: Once | INTRAVENOUS | Status: AC
  Administered 2012-05-24: 15:00:00 via INTRAVENOUS

## 2012-05-24 MED ORDER — ONDANSETRON HCL 4 MG/2ML IJ SOLN
INTRAMUSCULAR | Status: AC
  Filled 2012-05-24: qty 2

## 2012-05-24 MED ORDER — ACETAMINOPHEN 325 MG PO TABS: 650.0000 mg | ORAL_TABLET | ORAL | Status: DC | PRN

## 2012-05-24 MED ORDER — NITROGLYCERIN 2 % TD OINT
1.0000 [in_us] | TOPICAL_OINTMENT | Freq: Once | TRANSDERMAL | Status: AC
  Administered 2012-05-24: 1 [in_us] via TOPICAL
  Filled 2012-05-24: qty 1

## 2012-05-24 MED ORDER — SODIUM CHLORIDE 0.9 % IV SOLN: 250.0000 mL | INTRAVENOUS | Status: DC | PRN

## 2012-05-24 MED ORDER — ASPIRIN EC 81 MG PO TBEC
81.0000 mg | DELAYED_RELEASE_TABLET | Freq: Every day | ORAL | Status: DC
  Administered 2012-05-25 – 2012-05-29 (×5): 81 mg via ORAL
  Filled 2012-05-24 (×6): qty 1

## 2012-05-24 MED ORDER — NIACIN ER 500 MG PO CPCR
2000.0000 mg | ORAL_CAPSULE | Freq: Every day | ORAL | Status: DC
  Administered 2012-05-24 – 2012-05-28 (×5): 2000 mg via ORAL
  Filled 2012-05-24 (×6): qty 4

## 2012-05-24 MED ORDER — ALPRAZOLAM 0.25 MG PO TABS
0.2500 mg | ORAL_TABLET | Freq: Two times a day (BID) | ORAL | Status: DC | PRN
  Filled 2012-05-24: qty 1

## 2012-05-24 MED ORDER — ONDANSETRON HCL 4 MG/2ML IJ SOLN: 4.0000 mg | Freq: Four times a day (QID) | INTRAMUSCULAR | Status: DC | PRN

## 2012-05-24 MED ORDER — OMEGA-3 FATTY ACIDS 1000 MG PO CAPS: 1.0000 g | ORAL_CAPSULE | Freq: Every day | ORAL | Status: DC

## 2012-05-24 MED ORDER — ONDANSETRON HCL 4 MG/2ML IJ SOLN
4.0000 mg | Freq: Once | INTRAMUSCULAR | Status: AC
  Administered 2012-05-24: 4 mg via INTRAVENOUS

## 2012-05-24 MED ORDER — HEPARIN SODIUM (PORCINE) 5000 UNIT/ML IJ SOLN
5000.0000 [IU] | Freq: Three times a day (TID) | INTRAMUSCULAR | Status: DC
  Administered 2012-05-24 – 2012-05-29 (×14): 5000 [IU] via SUBCUTANEOUS
  Filled 2012-05-24 (×17): qty 1

## 2012-05-24 MED ORDER — LISINOPRIL 2.5 MG PO TABS
2.5000 mg | ORAL_TABLET | Freq: Every day | ORAL | Status: DC
  Administered 2012-05-24 – 2012-05-29 (×5): 2.5 mg via ORAL
  Filled 2012-05-24 (×6): qty 1

## 2012-05-24 MED ORDER — NIACIN ER (ANTIHYPERLIPIDEMIC) 500 MG PO TBCR
2000.0000 mg | EXTENDED_RELEASE_TABLET | Freq: Every day | ORAL | Status: DC
  Filled 2012-05-24: qty 4

## 2012-05-24 MED ORDER — FUROSEMIDE 10 MG/ML IJ SOLN
80.0000 mg | Freq: Two times a day (BID) | INTRAMUSCULAR | Status: DC
  Administered 2012-05-24 – 2012-05-25 (×3): 80 mg via INTRAVENOUS
  Filled 2012-05-24 (×6): qty 8

## 2012-05-24 MED ORDER — SODIUM CHLORIDE 0.9 % IJ SOLN
3.0000 mL | Freq: Two times a day (BID) | INTRAMUSCULAR | Status: DC
  Administered 2012-05-24 – 2012-05-29 (×10): 3 mL via INTRAVENOUS

## 2012-05-24 NOTE — ED Provider Notes (Signed)
History     CSN: [id number]  Arrival date & time 05/24/12  1004   First MD Initiated Contact with Patient 05/24/12 1059      Chief Complaint  Patient presents with  . Shortness of Breath    (Consider location/radiation/quality/duration/timing/severity/associated sxs/prior treatment) HPI Comments: Patient is an 79 year old man with shortness of breath and a lot of coughing. This started 2 days ago. He had a temperature 100.1 degrees last night. He is has not been exposed to anyone with a respiratory infection that he knows of. He has had prior coronary artery disease, with coronary artery bypass grafting 25 years ago, subsequent cardiac stents but none within the past 10 years, and a pacemaker. He quit smoking a number of years ago.  Patient is a 79 y.o. male presenting with shortness of breath. The history is provided by the patient. No language interpreter was used.  Shortness of Breath  The current episode started 2 days ago. The problem occurs frequently. The problem has been unchanged. The problem is moderate. Nothing relieves the symptoms. Nothing aggravates the symptoms. Associated symptoms include a fever, cough and shortness of breath. Pertinent negatives include no chest pain. Associated symptoms comments: Mild peripheral edema.. The maximum temperature noted was 101.0 to 102.1 F. The temperature was taken using an oral thermometer. The cough is non-productive. Nothing relieves the cough. Nothing worsens the cough. He has not inhaled smoke recently. He has had no prior steroid use. He has had no prior hospitalizations. He has had no prior ICU admissions. He has had no prior intubations. There were no sick contacts. Recently, medical care has been given by a specialist (He had his pacemaker checked recently; it will need replacement in about 4 months.).    Past Medical History  Diagnosis Date  . Coronary artery disease   . Hyperlipidemia   . Prostate cancer   . Intermittent atrial  fibrillation     Past Surgical History  Procedure Date  . Cardiac catheterization 05/07/2008  . Coronary artery bypass graft   . Insert / replace / remove pacemaker   . Coronary angioplasty with stent placement     Stent to RCA and OBTUSE MARGINAL ARTERY AND LEFT MAIN  . Doppler echocardiography 06/24/2002    EF 60-65%    Family History  Problem Relation Age of Onset  . Hypertension Mother   . Cancer Mother   . Alcohol abuse Father     History  Substance Use Topics  . Smoking status: Former Smoker    Quit date: 02/10/1971  . Smokeless tobacco: Not on file  . Alcohol Use: No      Review of Systems  Constitutional: Positive for fever.  HENT: Negative.   Eyes: Negative.   Respiratory: Positive for cough and shortness of breath.   Cardiovascular: Positive for leg swelling. Negative for chest pain.  Gastrointestinal: Negative.   Genitourinary: Negative.   Musculoskeletal: Negative.   Skin: Negative.   Neurological: Negative.   Psychiatric/Behavioral: Negative.     Allergies  Review of patient's allergies indicates no known allergies.  Home Medications   Current Outpatient Rx  Name  Route  Sig  Dispense  Refill  . VITAMIN C PO   Oral   Take 1 tablet by mouth daily.          . ASPIRIN 81 MG PO TABS   Oral   Take 81 mg by mouth daily.         Marland Kitchen CALCIUM PO   Oral  Take 1 tablet by mouth daily.          Marland Kitchen VITAMIN D PO   Oral   Take 1 capsule by mouth daily.          . COQ10 PO   Oral   Take 1 tablet by mouth daily.          . OMEGA-3 FATTY ACIDS 1000 MG PO CAPS   Oral   Take 1 g by mouth daily.          Marland Kitchen KRILL OIL PO   Oral   Take by mouth daily.           Marland Kitchen LYCOPENE PO   Oral   Take 1 tablet by mouth daily.          Marland Kitchen NIACIN ER (ANTIHYPERLIPIDEMIC) 500 MG PO TBCR   Oral   Take 2,000 mg by mouth at bedtime.           . SELENIUM 200 MCG PO CAPS   Oral   Take by mouth daily.           Marland Kitchen VITAMIN E PO   Oral   Take 1  capsule by mouth daily.            BP 163/92  Pulse 81  Temp 98.1 F (36.7 C) (Oral)  Resp 32  SpO2 99%  Physical Exam  Nursing note and vitals reviewed. Constitutional:       Tachypneic at 32.  HENT:  Head: Normocephalic and atraumatic.  Right Ear: External ear normal.  Left Ear: External ear normal.  Mouth/Throat: Oropharynx is clear and moist.  Eyes: Conjunctivae normal and EOM are normal. Pupils are equal, round, and reactive to light.  Neck: Normal range of motion. Neck supple.  Cardiovascular: Normal rate, regular rhythm and normal heart sounds.   Pulmonary/Chest: Effort normal and breath sounds normal. He exhibits no tenderness.       Well healed median sternotomy scar.  Abdominal: Soft. Bowel sounds are normal.  Musculoskeletal:       2+ foot and ankle edema bilaterally.  Skin: Skin is warm and dry.  Psychiatric: He has a normal mood and affect. His behavior is normal.    ED Course  Procedures (including critical care time)   10:22 AM  Date: 05/24/2012  Rate: 68  Rhythm: normal sinus rhythm  QRS Axis: right  Intervals: normal QRS:  Poor R wave progression in precordial leads suggests old anterior myocardial infarction.  ST/T Wave abnormalities: ST depressions laterally  Conduction Disutrbances:none  Narrative Interpretation: Abnormal EKG  Old EKG Reviewed: changes noted--changes of inferior myocardial infarction seen on tracing of 04/28/2008 have normalized.  11:27 AM Pt was seen and had physical examination.  Lab workup was ordered.  12:41 PM 12:50 PM Results for orders placed during the hospital encounter of 05/24/12  CBC WITH DIFFERENTIAL      Component Value Range   WBC 7.7  4.0 - 10.5 K/uL   RBC 3.99 (*) 4.22 - 5.81 MIL/uL   Hemoglobin 12.6 (*) 13.0 - 17.0 g/dL   HCT 37.6 (*) 39.0 - 52.0 %   MCV 94.2  78.0 - 100.0 fL   MCH 31.6  26.0 - 34.0 pg   MCHC 33.5  30.0 - 36.0 g/dL   RDW 14.6  11.5 - 15.5 %   Platelets 97 (*) 150 - 400 K/uL    Neutrophils Relative 84 (*) 43 - 77 %   Neutro Abs 6.4  1.7 -  7.7 K/uL   Lymphocytes Relative 6 (*) 12 - 46 %   Lymphs Abs 0.5 (*) 0.7 - 4.0 K/uL   Monocytes Relative 10  3 - 12 %   Monocytes Absolute 0.8  0.1 - 1.0 K/uL   Eosinophils Relative 0  0 - 5 %   Eosinophils Absolute 0.0  0.0 - 0.7 K/uL   Basophils Relative 0  0 - 1 %   Basophils Absolute 0.0  0.0 - 0.1 K/uL  COMPREHENSIVE METABOLIC PANEL      Component Value Range   Sodium 136  135 - 145 mEq/L   Potassium 4.1  3.5 - 5.1 mEq/L   Chloride 98  96 - 112 mEq/L   CO2 26  19 - 32 mEq/L   Glucose, Bld 112 (*) 70 - 99 mg/dL   BUN 20  6 - 23 mg/dL   Creatinine, Ser 1.03  0.50 - 1.35 mg/dL   Calcium 9.4  8.4 - 10.5 mg/dL   Total Protein 7.4  6.0 - 8.3 g/dL   Albumin 3.4 (*) 3.5 - 5.2 g/dL   AST 29  0 - 37 U/L   ALT 23  0 - 53 U/L   Alkaline Phosphatase 57  39 - 117 U/L   Total Bilirubin 1.3 (*) 0.3 - 1.2 mg/dL   GFR calc non Af Amer 66 (*) >90 mL/min   GFR calc Af Amer 77 (*) >90 mL/min  PRO B NATRIURETIC PEPTIDE      Component Value Range   Pro B Natriuretic peptide (BNP) 8552.0 (*) 0 - 450 pg/mL  POCT I-STAT TROPONIN I      Component Value Range   Troponin i, poc 0.06  0.00 - 0.08 ng/mL   Comment 3            Dg Chest 2 View  05/24/2012  *RADIOLOGY REPORT*  Clinical Data: Cough and fever  CHEST - 2 VIEW  Comparison: CT 05/03/2008, chest radiograph 05/03/2008  Findings: New patchy perihilar airspace opacities are noted.  Trace pleural effusions or thickening noted.  Evidence of CABG.  Right- sided dual lead pacer in place.  Mild cardiac enlargement with central vascular congestion.  A few interstitial Kerley B lines noted peripherally.  IMPRESSION: Patchy perihilar airspace opacities, borderline cardiomegaly, and pleural effusions suggest pulmonary edema.  This could obscure pneumonia given the history of cough and fever.  If the patient is felt to be volume overloaded, follow-up PA and lateral chest radiographs after diuresis  may be helpful.   Original Report Authenticated By: Conchita Paris, M.D.     Lab workup shows CHF.  Results of tests discussed with the patient and with his wife.  Call to [facility] to admit him.  Rx with IV Lasix, topical NTG.   1. Congestive heart failure     1:21 PM Spoke to Cristopher Peru, M.D. [facility].  He will see and admit pt.     Mylinda Latina III, MD 05/24/12 1322

## 2012-05-24 NOTE — ED Notes (Signed)
Pt reports swelling in lower extremities x 1 month.

## 2012-05-24 NOTE — ED Provider Notes (Deleted)
10:22 AM  Date: 05/24/2012  Rate: 68  Rhythm: normal sinus rhythm  QRS Axis: right  Intervals: normal QRS:  Poor R wave progression in precordial leads suggests old anterior myocardial infarction.  ST/T Wave abnormalities: ST depressions laterally  Conduction Disutrbances:none  Narrative Interpretation: Abnormal EKG  Old EKG Reviewed: changes noted--changes of inferior myocardial infarction seen on tracing of 04/28/2008 have normalized.      Marcus Latina III, MD 05/24/12 1025

## 2012-05-24 NOTE — ED Notes (Addendum)
Pt reporting sob x 2 days. At night pain across shoulders and back. Pt has pacemaker, reports batteries need changing. Denies any CP at this time. Pt alert and oriented. RR 26/min, 98% RA.

## 2012-05-24 NOTE — H&P (Signed)
Patient ID: Marcus [name] MRN: [id number], DOB/AGE: 1928-06-18   Admit date: 05/24/2012  Primary Cardiologist: Joaquim Nam, MD Electrophysiologist:  Darnell Level. Lovena Le, MD  Pt. Profile:  79 year old male with history of CAD and ischemic cardiomyopathy who presents with a four-day history progressive dyspnea and is found to have CHF. He has had some subjective fevers and chills. He admits to dietary indiscretion and medical non-compliance. No syncope  Problem List  Past Medical History  Diagnosis Date  . Coronary artery disease     a. s/p remote CABG x 2(VG->OM, LIMA->LAD;  b. Late 90's s/p PCI of RCA;  c. 12/09 Cath/PCI: LM 58m, 70-80d (3.0x44mm Xience DES), LAD100p, LCX 80-90p (2.25x18mmTaxus Atom DES), RCA 100d (2.5x46mm Xience DES), VG->OM 100, LIMA->LAD nl, EF 30-35%.  . Hyperlipidemia   . Prostate cancer   . PAF (paroxysmal atrial fibrillation)   . Ischemic cardiomyopathy     a. EF 30-35% by LV gram 04/2008  . Symptomatic bradycardia     a. 08/2003 s/p MDT Enpulse [id number] Dual chamber PPM ser # [id number] H.    Past Surgical History  Procedure Date  . Coronary artery bypass graft   . Insert / replace / remove pacemaker   . Doppler echocardiography 06/24/2002    EF 60-65%    Allergies  No Known Allergies  HPI  79 year old male with the above problem list. Maybe about 7-10 days ago, he began to note that his pants are fitting more tightly and had to let out with one belt loop. Starting this past Wednesday, he began to experience progressive dyspnea on exertion associated with lower extremity edema and subsequent orthopnea. Since Thursday of this week, he's been experiencing a somewhat constant, mild chest pressure radiating into his back. In this setting, he has had constant chills as well as a nonproductive cough. His chest pain has been somewhat worse during coughing. Last night, he spiked a fever of approximately 102 and his wife treated him with Tylenol. This morning he was more  dyspneic and his wife took him to the [facility] where his BNP is elevated at 8552 and his chest x-ray suggests pulmonary edema. He is currently stable and without complaint.  Home Medications  Prior to Admission medications   Medication Sig Start Date End Date Taking? Authorizing Provider  Ascorbic Acid (VITAMIN C PO) Take 1 tablet by mouth daily.    Yes Historical Provider, MD  aspirin 81 MG tablet Take 81 mg by mouth daily.   Yes Historical Provider, MD  CALCIUM PO Take 1 tablet by mouth daily.    Yes Historical Provider, MD  Cholecalciferol (VITAMIN D PO) Take 1 capsule by mouth daily.    Yes Historical Provider, MD  Coenzyme Q10 (COQ10 PO) Take 1 tablet by mouth daily.    Yes Historical Provider, MD  fish oil-omega-3 fatty acids 1000 MG capsule Take 1 g by mouth daily.    Yes Historical Provider, MD  KRILL OIL PO Take by mouth daily.     Yes Historical Provider, MD  LYCOPENE PO Take 1 tablet by mouth daily.    Yes Historical Provider, MD  niacin (NIASPAN) 500 MG CR tablet Take 2,000 mg by mouth at bedtime.     Yes Historical Provider, MD  Selenium 200 MCG CAPS Take by mouth daily.     Yes Historical Provider, MD  VITAMIN E PO Take 1 capsule by mouth daily.    Yes Historical Provider, MD   Family History  Family History  Problem  Relation Age of Onset  . Hypertension Mother   . Cancer Mother   . Alcohol abuse Father    Social History  History   Social History  . Marital Status: Married    Spouse Name: [name]    Number of Children: [name]  . Years of Education: [name]   Occupational History  . Not on file.   Social History Main Topics  . Smoking status: Former Smoker    Quit date: 02/10/1971  . Smokeless tobacco: Not on file  . Alcohol Use: No  . Drug Use: No  . Sexually Active:    Other Topics Concern  . Not on file   Social History Narrative   Lives in [place] with wife.  Retired.    Review of Systems General:  +++ chills x 3 days with fever last night.  No night sweats or  weight changes.  Cardiovascular:  +++ chest discomfort and pressure x 3 days in constant fashion.  +++ dyspnea on exertion, LE edema, and orthopnea.  No palpitations, paroxysmal nocturnal dyspnea. Dermatological: No rash, lesions/masses Respiratory: +++ nonproductive cough, dyspnea Urologic: No hematuria, dysuria Abdominal:   +++ nausea & vomiting this AM.  No diarrhea, bright red blood per rectum, melena, or hematemesis Neurologic:  No visual changes, wkns, changes in mental status. All other systems reviewed and are otherwise negative except as noted above.  Physical Exam  Blood pressure 135/66, pulse 69, temperature 98.1 F (36.7 C), temperature source Oral, resp. rate 18, SpO2 95.00%.  General: Pleasant, NAD Psych: Normal affect. Neuro: Alert and oriented X 3. Moves all extremities spontaneously. HEENT: Normal  Neck: Supple without bruits.  JVP approx 12cm.  Lungs:  Resp regular and unlabored.  Crackles 1/2 way up bilat. Heart: RRR + s3. Abdomen: Soft, non-tender, non-distended, BS + x 4.  Extremities: No clubbing, cyanosis.  1+ bilat LE edema to knees. DP/PT/Radials 1+ and equal bilaterally.  Labs  Troponin i, poc 0.06 pBNP 8552.0  Lab Results  Component Value Date   WBC 7.7 05/24/2012   HGB 12.6* 05/24/2012   HCT 37.6* 05/24/2012   MCV 94.2 05/24/2012   PLT 97* 05/24/2012    Lab 05/24/12 1119  NA 136  K 4.1  CL 98  CO2 26  BUN 20  CREATININE 1.03  CALCIUM 9.4  PROT 7.4  BILITOT 1.3*  ALKPHOS 57  ALT 23  AST 29  GLUCOSE 112*   Radiology/Studies  Dg Chest 2 View  05/24/2012  *RADIOLOGY REPORT*  Clinical Data: Cough and fever  CHEST - 2 VIEW  Comparison: CT 05/03/2008, chest radiograph 05/03/2008  Findings: New patchy perihilar airspace opacities are noted.  Trace pleural effusions or thickening noted.  Evidence of CABG.  Right- sided dual lead pacer in place.  Mild cardiac enlargement with central vascular congestion.  A few interstitial Kerley B lines noted  peripherally.  IMPRESSION: Patchy perihilar airspace opacities, borderline cardiomegaly, and pleural effusions suggest pulmonary edema.  This could obscure pneumonia given the history of cough and fever.  If the patient is felt to be volume overloaded, follow-up PA and lateral chest radiographs after diuresis may be helpful.   Original Report Authenticated By: Conchita Paris, M.D.    ECG  Rsr, 68.  ASSESSMENT AND PLAN  1. Acute on chronic systolic congestive heart failure/ischemic cardiomyopathy: Patient presents with a 7 to ten-day history of increasing abdominal girth with subsequent dyspnea, orthopnea, and lower extremity edema. The last EF that I see recorded was during ventriculography in 2009,  showing an EF of 30-35%. He does have evidence of volume overload on exam and has been treated with Lasix here in the ED. We will plan to admit, continue to cycle cardiac markers which so far are normal, and continue diuresis. Repeat 2-D echocardiogram. Add low-dose ACE inhibitor with plan to add beta blocker, perhaps in the a.m., once he is no longer acutely decompensated. He denies any recent change in his dietary habits or salt load. We'll need to consider repeat ischemic evaluation.  2. Coronary artery disease: Patient's been having more or less constant chest tightness and pressure since Wednesday or Thursday. Despite this, and his initial troponin is normal. As above, we will cycle cardiac markers. Add heparin only if he rules in. Continue aspirin with a plan to add beta blocker as above.  3. Hyperlipidemia: He is not on a statin and I do not see a listed allergy or intolerance. He is on some over-the-counter vitamins. Check lipids LTZ consider statin initiation.  Signed, Murray Hodgkins, NP 05/24/2012, 2:16 PM  Cardiology Attending  Patient seen and examined. Agree with the above exam, assessment and plan as outlined by Mr. Sharolyn Douglas. He has acute systolic CHF exacerbation. Will admit for IV  diuresis and uptitration of medical therapy. No anginal symptoms. He admits to dietary indiscretion. He denies medical non-compliance.  Mikle Bosworth.D.

## 2012-05-24 NOTE — ED Notes (Signed)
Pt had episode of vomiting. EDP made aware

## 2012-05-25 LAB — COMPREHENSIVE METABOLIC PANEL
ALT: 16 U/L (ref 0–53)
Albumin: 2.9 g/dL — ABNORMAL LOW (ref 3.5–5.2)
Alkaline Phosphatase: 49 U/L (ref 39–117)
BUN: 30 mg/dL — ABNORMAL HIGH (ref 6–23)
CO2: 25 mEq/L (ref 19–32)
Chloride: 100 mEq/L (ref 96–112)
Creatinine, Ser: 1.23 mg/dL (ref 0.50–1.35)
GFR calc Af Amer: 62 mL/min — ABNORMAL LOW (ref >90)
GFR calc non Af Amer: 53 mL/min — ABNORMAL LOW (ref >90)
Glucose, Bld: 102 mg/dL — ABNORMAL HIGH (ref 70–99)
Potassium: 3.6 mEq/L (ref 3.5–5.1)
Total Bilirubin: 1.2 mg/dL (ref 0.3–1.2)
Total Protein: 6.1 g/dL (ref 6.0–8.3)

## 2012-05-25 LAB — TSH: TSH: 0.736 u[IU]/mL (ref 0.350–4.500)

## 2012-05-25 LAB — TROPONIN I: Troponin I: <0.3 ng/mL (ref <0.30)

## 2012-05-25 MED ORDER — AZITHROMYCIN 500 MG PO TABS
500.0000 mg | ORAL_TABLET | Freq: Every day | ORAL | Status: AC
  Administered 2012-05-25: 500 mg via ORAL
  Filled 2012-05-25: qty 1

## 2012-05-25 MED ORDER — AZITHROMYCIN 250 MG PO TABS
250.0000 mg | ORAL_TABLET | Freq: Every day | ORAL | Status: AC
  Administered 2012-05-26 – 2012-05-29 (×4): 250 mg via ORAL
  Filled 2012-05-25 (×4): qty 1

## 2012-05-25 NOTE — Progress Notes (Signed)
  Echocardiogram 2D Echocardiogram has been performed.  Bennett, [facility] 05/25/2012, 9:28 AM

## 2012-05-25 NOTE — Progress Notes (Signed)
Patient ID: Marcus [name], male   DOB: 1928/08/11, 79 y.o.   MRN: [id number] Subjective:  Dyspnea improved but still not back to baseline.  Objective:  Vital Signs in the last 24 hours: Temp:  [97.9 F (36.6 C)-100.2 F (37.9 C)] 98.1 F (36.7 C) (01/05 0650) Pulse Rate:  [54-69] 68  (01/05 0650) Resp:  [16-28] 16  (01/05 0650) BP: (99-148)/(45-72) 99/51 mmHg (01/05 0903) SpO2:  [91 %-98 %] 91 % (01/05 0650) Weight:  [145 lb 6.4 oz (65.953 kg)-148 lb 14.4 oz (67.541 kg)] 145 lb 6.4 oz (65.953 kg) (01/05 0650)  Intake/Output from previous day: 01/04 0701 - 01/05 0700 In: 240 [P.O.:240] Out: 2950 [Urine:2950] Intake/Output from this shift: Total I/O In: 360 [P.O.:360] Out: 400 [Urine:400]  Physical Exam: Well appearing elderly man, NAD HEENT: Unremarkable Neck:  7 cm JVD, no thyromegally Lungs:  Clear except for basilar rales. HEART:  Regular rate rhythm, no murmurs, no rubs, no clicks Abd:  soft, positive bowel sounds, no organomegally, no rebound, no guarding Ext:  2 plus pulses, no edema, no cyanosis, no clubbing Skin:  No rashes no nodules Neuro:  CN II through XII intact, motor grossly intact  Lab Results:  Basename 05/24/12 1745 05/24/12 1119  WBC 7.3 7.7  HGB 12.3* 12.6*  PLT 114* 97*    Basename 05/25/12 0510 05/24/12 1745 05/24/12 1119  NA 140 -- 136  K 3.6 -- 4.1  CL 100 -- 98  CO2 25 -- 26  GLUCOSE 102* -- 112*  BUN 30* -- 20  CREATININE 1.23 1.15 --    Basename 05/25/12 0515 05/24/12 2236  TROPONINI <0.30 <0.30   Hepatic Function Panel  Basename 05/25/12 0510  PROT 6.1  ALBUMIN 2.9*  AST 20  ALT 16  ALKPHOS 49  BILITOT 1.2  BILIDIR --  IBILI --   No results found for this basename: CHOL in the last 72 hours No results found for this basename: PROTIME in the last 72 hours  Imaging: Dg Chest 2 View  05/24/2012  *RADIOLOGY REPORT*  Clinical Data: Cough and fever  CHEST - 2 VIEW  Comparison: CT 05/03/2008, chest radiograph 05/03/2008   Findings: New patchy perihilar airspace opacities are noted.  Trace pleural effusions or thickening noted.  Evidence of CABG.  Right- sided dual lead pacer in place.  Mild cardiac enlargement with central vascular congestion.  A few interstitial Kerley B lines noted peripherally.  IMPRESSION: Patchy perihilar airspace opacities, borderline cardiomegaly, and pleural effusions suggest pulmonary edema.  This could obscure pneumonia given the history of cough and fever.  If the patient is felt to be volume overloaded, follow-up PA and lateral chest radiographs after diuresis may be helpful.   Original Report Authenticated By: Conchita Paris, M.D.     Cardiac Studies: Tele - nsr Assessment/Plan:  1. Acute on chronic heart failure 2. PAF 3. Possible pneumonia Rec: continue IV lasix. Will repeat labs and cxr tomorrow. Will add azithromycin. Anticipate discharge 24-48 hours.  LOS: 1 day    Hawa Henly,M.D. 05/25/2012, 10:20 AM

## 2012-05-26 ENCOUNTER — Inpatient Hospital Stay (HOSPITAL_COMMUNITY): Payer: Medicare Other

## 2012-05-26 LAB — BASIC METABOLIC PANEL
Calcium: 8.9 mg/dL (ref 8.4–10.5)
GFR calc Af Amer: 64 mL/min — ABNORMAL LOW (ref >90)
GFR calc non Af Amer: 55 mL/min — ABNORMAL LOW (ref >90)
Potassium: 3.1 mEq/L — ABNORMAL LOW (ref 3.5–5.1)
Sodium: 140 mEq/L (ref 135–145)

## 2012-05-26 LAB — CBC
Hemoglobin: 10.9 g/dL — ABNORMAL LOW (ref 13.0–17.0)
Platelets: 135 10*3/uL — ABNORMAL LOW (ref 150–400)
RBC: 3.43 MIL/uL — ABNORMAL LOW (ref 4.22–5.81)

## 2012-05-26 MED ORDER — FUROSEMIDE 10 MG/ML IJ SOLN
40.0000 mg | Freq: Two times a day (BID) | INTRAMUSCULAR | Status: DC
  Administered 2012-05-26 – 2012-05-27 (×4): 40 mg via INTRAVENOUS
  Filled 2012-05-26 (×4): qty 4

## 2012-05-26 MED ORDER — GUAIFENESIN-DM 100-10 MG/5ML PO SYRP
5.0000 mL | ORAL_SOLUTION | ORAL | Status: DC | PRN
  Administered 2012-05-26: 5 mL via ORAL
  Filled 2012-05-26: qty 5

## 2012-05-26 NOTE — Progress Notes (Signed)
Patient with episodes of confusion throughout shift. When questioned in the morning by RN, patient was unaware of any activities that happened.  MD notified. RN will continue to monitor. Shellee Milo, RN

## 2012-05-26 NOTE — Progress Notes (Signed)
Patient ID: Marcus [name], male   DOB: 02/13/1929, 79 y.o.   MRN: [id number] Subjective:  Dyspnea improved but still not back to baseline.  Echo shows moderate LV dysfunction with EF 35-40%  Objective:  Vital Signs in the last 24 hours: Temp:  [98.7 F (37.1 C)-99 F (37.2 C)] 98.9 F (37.2 C) (01/06 0521) Pulse Rate:  [67-80] 80  (01/06 0521) Resp:  [18-19] 18  (01/06 0521) BP: (99-140)/(50-63) 128/50 mmHg (01/06 0521) SpO2:  [95 %-98 %] 95 % (01/06 0521) Weight:  [142 lb 3.2 oz (64.501 kg)] 142 lb 3.2 oz (64.501 kg) (01/06 0521)  Intake/Output from previous day: 01/05 0701 - 01/06 0700 In: 940 [P.O.:940] Out: 2500 [Urine:2500] Intake/Output from this shift:    Physical Exam: Well appearing elderly man, NAD HEENT: Unremarkable Neck:  7 cm JVD, no thyromegally Lungs:  Basilar rales HEART:  Regular rate rhythm, no murmurs, no rubs, no clicks Abd:  soft, positive bowel sounds, no organomegally, no rebound, no guarding Ext:  2 plus pulses, trace ankle edema, no cyanosis, no clubbing Skin:  No rashes no nodules Neuro:  CN II through XII intact, motor grossly intact  Lab Results:  Basename 05/26/12 0612 05/24/12 1745  WBC 7.0 7.3  HGB 10.9* 12.3*  PLT 135* 114*    Basename 05/25/12 0510 05/24/12 1745 05/24/12 1119  NA 140 -- 136  K 3.6 -- 4.1  CL 100 -- 98  CO2 25 -- 26  GLUCOSE 102* -- 112*  BUN 30* -- 20  CREATININE 1.23 1.15 --    Basename 05/25/12 0515 05/24/12 2236  TROPONINI <0.30 <0.30   Hepatic Function Panel  Basename 05/25/12 0510  PROT 6.1  ALBUMIN 2.9*  AST 20  ALT 16  ALKPHOS 49  BILITOT 1.2  BILIDIR --  IBILI --   No results found for this basename: CHOL in the last 72 hours No results found for this basename: PROTIME in the last 72 hours  Imaging: Dg Chest 2 View  05/24/2012  *RADIOLOGY REPORT*  Clinical Data: Cough and fever  CHEST - 2 VIEW  Comparison: CT 05/03/2008, chest radiograph 05/03/2008  Findings: New patchy perihilar  airspace opacities are noted.  Trace pleural effusions or thickening noted.  Evidence of CABG.  Right- sided dual lead pacer in place.  Mild cardiac enlargement with central vascular congestion.  A few interstitial Kerley B lines noted peripherally.  IMPRESSION: Patchy perihilar airspace opacities, borderline cardiomegaly, and pleural effusions suggest pulmonary edema.  This could obscure pneumonia given the history of cough and fever.  If the patient is felt to be volume overloaded, follow-up PA and lateral chest radiographs after diuresis may be helpful.   Original Report Authenticated By: Conchita Paris, M.D.     Cardiac Studies: Tele - nsr Assessment/Plan:  1. Acute on chronic heart failure-  Will reduce dose to 40 mg IV BID.  I think this will achieve as much diuresis as the 80 BID. 2. PAF 3. Possible pneumonia- azithromycin added Rec: continue IV lasix.   He still has dyspnea and rales.  He will need several more days before DC.  LOS: 2 days    Mertie Moores Jr.,M.D. 05/26/2012, 7:41 AM

## 2012-05-26 NOTE — Plan of Care (Signed)
Problem: Phase I Progression Outcomes Goal: EF % per last Echo/documented,Core Reminder form on chart Outcome: Completed/Met Date Met:  05/26/12 Echo performed January 5th, 2014 showed EF 35-40% Shellee Milo, RN

## 2012-05-26 NOTE — Progress Notes (Signed)
Patient c/o cough. Robitussin DM ordered per protocol. Patient resting comfortably in NAD.  Current O2 sat = 100% on RA.  RN will continue to monitor. Shellee Milo, RN

## 2012-05-27 ENCOUNTER — Ambulatory Visit: Payer: Medicare Other | Admitting: Cardiovascular Disease

## 2012-05-27 DIAGNOSIS — I10 Essential (primary) hypertension: Secondary | ICD-10-CM

## 2012-05-27 DIAGNOSIS — Z950 Presence of cardiac pacemaker: Secondary | ICD-10-CM

## 2012-05-27 MED ORDER — POTASSIUM CHLORIDE CRYS ER 20 MEQ PO TBCR
40.0000 meq | EXTENDED_RELEASE_TABLET | Freq: Two times a day (BID) | ORAL | Status: DC
  Administered 2012-05-27 (×2): 40 meq via ORAL
  Filled 2012-05-27 (×3): qty 2

## 2012-05-27 MED ORDER — CARVEDILOL 3.125 MG PO TABS
3.1250 mg | ORAL_TABLET | Freq: Two times a day (BID) | ORAL | Status: DC
  Administered 2012-05-27: 3.125 mg via ORAL
  Filled 2012-05-27 (×3): qty 1

## 2012-05-27 MED ORDER — CARVEDILOL 3.125 MG PO TABS
3.1250 mg | ORAL_TABLET | Freq: Two times a day (BID) | ORAL | Status: DC
  Administered 2012-05-27 – 2012-05-28 (×2): 3.125 mg via ORAL
  Filled 2012-05-27 (×6): qty 1

## 2012-05-27 NOTE — Progress Notes (Signed)
Pt's BP=89/52 and pt c/o lightheadedness. Paged PA on call. Orders received for orthostatic BP's and changes to BP meds made. Will follow orders and monitor pt closely.  Eulis Canner, RN

## 2012-05-27 NOTE — Progress Notes (Signed)
Called be RN for hypotension. Pt rec'd Lasix 40 mg IV, Coreg 3.125 mg PO and lisinopril 2.5 mg PO. SBP decreased to 89 and pt c/o dizziness. UOP about 600 cc today.  Will change Coreg and lisinopril to after the Lasix and put parameters on them. Check orthostatics and continue current therapy if they are negative. Hold Lasix tonight if positive and reassess in am.  Rosaria Ferries

## 2012-05-27 NOTE — Progress Notes (Signed)
Patient ID: BASSEL RUDISILL, male   DOB: Jun 17, 1928, 79 y.o.   MRN: [id number] Subjective:  Dyspnea improved but still not back to baseline.  Echo shows moderate LV dysfunction with EF 35-40%  Objective:  Vital Signs in the last 24 hours: Temp:  [97.7 F (36.5 C)-99.7 F (37.6 C)] 99.4 F (37.4 C) (01/07 0600) Pulse Rate:  [70-77] 76  (01/07 0600) Resp:  [18-20] 18  (01/07 0600) BP: (115-138)/(55-63) 115/63 mmHg (01/07 0600) SpO2:  [94 %-99 %] 94 % (01/07 0600) Weight:  [139 lb 9.6 oz (63.322 kg)] 139 lb 9.6 oz (63.322 kg) (01/07 0600)  Intake/Output from previous day: 01/06 0701 - 01/07 0700 In: 1064 [P.O.:1060; IV Piggyback:4] Out: 2500 [Urine:2500] Intake/Output from this shift:    Physical Exam: Well appearing elderly man, NAD HEENT: Unremarkable Neck:  7 cm JVD, no thyromegally Lungs:  Basilar rales HEART:  Regular rate rhythm, no murmurs, no rubs, no clicks Abd:  soft, positive bowel sounds, no organomegally, no rebound, no guarding Ext:  2 plus pulses, trace ankle edema, no cyanosis, no clubbing Skin:  No rashes no nodules Neuro:  CN II through XII intact, motor grossly intact  Lab Results:  Basename 05/26/12 0612 05/24/12 1745  WBC 7.0 7.3  HGB 10.9* 12.3*  PLT 135* 114*    Basename 05/26/12 0612 05/25/12 0510  NA 140 140  K 3.1* 3.6  CL 99 100  CO2 29 25  GLUCOSE 126* 102*  BUN 31* 30*  CREATININE 1.19 1.23    Basename 05/25/12 0515 05/24/12 2236  TROPONINI <0.30 <0.30   Hepatic Function Panel  Basename 05/25/12 0510  PROT 6.1  ALBUMIN 2.9*  AST 20  ALT 16  ALKPHOS 49  BILITOT 1.2  BILIDIR --  IBILI --   No results found for this basename: CHOL in the last 72 hours No results found for this basename: PROTIME in the last 72 hours  Imaging: Dg Chest 2 View  05/26/2012  *RADIOLOGY REPORT*  Clinical Data: Congestive heart failure and shortness of breath.  CHEST - 2 VIEW  Comparison: 05/24/2012  Findings: Pulmonary edema pattern has  improved significantly with mild interstitial edema remaining.  Stable small bilateral pleural effusions are present. The heart size is normal and indwelling pacemaker shows stable radiographic appearance.  IMPRESSION: Significant decrease in pulmonary edema.   Original Report Authenticated By: Aletta Edouard, M.D.     Cardiac Studies: Tele - atrial pacing Assessment/Plan:  1. Acute on chronic heart failure-  He has continued to diurese well.  Echo reveals EF of 35-40%.  Continue Lisinopril 2.5.  Will start coreg 3.125 BID.  Continue Lasix at current dose.  Anticipate lowering the dose tomorrow. 2. PAF - A pacing  3. Possible pneumonia- azithromycin added 4. Hypokalemia: K = 3.1   Will increase kdur to 40 meq.  We will reduce this (possibly tomorrow) when the Lasix dose is reduced.    LOS: 3 days    Mertie Moores Jr.,M.D. 05/27/2012, 8:09 AM

## 2012-05-28 DIAGNOSIS — I4891 Unspecified atrial fibrillation: Secondary | ICD-10-CM

## 2012-05-28 DIAGNOSIS — I509 Heart failure, unspecified: Secondary | ICD-10-CM

## 2012-05-28 LAB — BASIC METABOLIC PANEL
BUN: 31 mg/dL — ABNORMAL HIGH (ref 6–23)
CO2: 30 mEq/L (ref 19–32)
Calcium: 9.7 mg/dL (ref 8.4–10.5)
Creatinine, Ser: 1.21 mg/dL (ref 0.50–1.35)
GFR calc non Af Amer: 54 mL/min — ABNORMAL LOW (ref >90)
Glucose, Bld: 135 mg/dL — ABNORMAL HIGH (ref 70–99)
Sodium: 139 mEq/L (ref 135–145)

## 2012-05-28 MED ORDER — POTASSIUM CHLORIDE CRYS ER 20 MEQ PO TBCR
20.0000 meq | EXTENDED_RELEASE_TABLET | Freq: Every day | ORAL | Status: DC
  Administered 2012-05-28 – 2012-05-29 (×2): 20 meq via ORAL
  Filled 2012-05-28: qty 1

## 2012-05-28 MED ORDER — POTASSIUM CHLORIDE CRYS ER 20 MEQ PO TBCR: 20.0000 meq | EXTENDED_RELEASE_TABLET | Freq: Two times a day (BID) | ORAL | Status: DC

## 2012-05-28 MED ORDER — FUROSEMIDE 40 MG PO TABS
40.0000 mg | ORAL_TABLET | Freq: Every day | ORAL | Status: DC
  Administered 2012-05-29: 40 mg via ORAL
  Filled 2012-05-28: qty 1

## 2012-05-28 NOTE — Progress Notes (Signed)
Pt hypotensive with BP 76/54 in R arm and 88/55 in L arm. Pt asymptomatic. Notified PA on call and was advised to hold Lasix and all BP meds and address with Dr Acie Fredrickson when he rounded. Advised pt to call for assistance if he needed to get up. Will follow MD orders and continue to monitor pt closely.  Eulis Canner, RN

## 2012-05-28 NOTE — Progress Notes (Signed)
See progress note from today  Ramond Dial., MD, [facility] 05/28/2012, 12:22 PM Office - [phone] Pager [phone]]

## 2012-05-28 NOTE — Progress Notes (Signed)
Patient ID: Marcus [name], male   DOB: 07-07-1928, 79 y.o.   MRN: [id number] Subjective:  Dyspnea improved but still not back to baseline.  Echo shows moderate LV dysfunction with EF 35-40%.  He had some hypotension this am after starting Coreg yesterday.  He has continued to diurese well   Objective:  Vital Signs in the last 24 hours: Temp:  [97.6 F (36.4 C)-99.5 F (37.5 C)] 99.5 F (37.5 C) (01/08 0459) Pulse Rate:  [57-95] 77  (01/08 0459) Resp:  [17-18] 18  (01/08 0459) BP: (76-130)/(48-67) 103/63 mmHg (01/08 1127) SpO2:  [95 %-99 %] 96 % (01/08 0459) Weight:  [138 lb 4.8 oz (62.732 kg)] 138 lb 4.8 oz (62.732 kg) (01/08 0459)  Intake/Output from previous day: 01/07 0701 - 01/08 0700 In: 1211 [P.O.:1200; I.V.:3; IV Piggyback:8] Out: 2300 [Urine:2300] Intake/Output from this shift: Total I/O In: 600 [P.O.:600] Out: 300 [Urine:300]  Physical Exam: Well appearing elderly man, NAD HEENT: Unremarkable Neck:  7 cm JVD, no thyromegally Lungs:  Clear this am HEART:  Regular rate rhythm, no murmurs, no rubs, no clicks Abd:  soft, positive bowel sounds, no organomegally, no rebound, no guarding Ext:  2 plus pulses, trace ankle edema, no cyanosis, no clubbing Skin:  No rashes no nodules Neuro:  CN II through XII intact, motor grossly intact  Lab Results:  Basename 05/26/12 0612  WBC 7.0  HGB 10.9*  PLT 135*    Basename 05/28/12 1028 05/26/12 0612  NA 139 140  K 3.9 3.1*  CL 99 99  CO2 30 29  GLUCOSE 135* 126*  BUN 31* 31*  CREATININE 1.21 1.19   No results found for this basename: TROPONINI:2,CK,MB:2 in the last 72 hours Hepatic Function Panel No results found for this basename: PROT,ALBUMIN,AST,ALT,ALKPHOS,BILITOT,BILIDIR,IBILI in the last 72 hours No results found for this basename: CHOL in the last 72 hours No results found for this basename: PROTIME in the last 72 hours  Imaging: No results found.  Cardiac Studies: Tele - atrial  pacing Assessment/Plan:  1. Acute on chronic heart failure-  He has developed some hypotension - I think that we have diuresed him adequately at this point.  Will hold Lasix, Coreg and Lisinopril this AM.  Encouraged PO intake.  Restart meds tonight if BP is better. Anticipate DC in 1-2 days.  Will check BMP today and daily  2. PAF - A pacing  3. Possible pneumonia- azithromycin added 4. Hypokalemia: improved after extra Kdur yesterday    LOS: 4 days    Mertie Moores Jr.,M.D. 05/28/2012, 12:23 PM

## 2012-05-29 ENCOUNTER — Encounter (HOSPITAL_COMMUNITY): Payer: Self-pay | Admitting: Cardiology

## 2012-05-29 DIAGNOSIS — I4891 Unspecified atrial fibrillation: Secondary | ICD-10-CM

## 2012-05-29 LAB — BASIC METABOLIC PANEL
BUN: 27 mg/dL — ABNORMAL HIGH (ref 6–23)
Calcium: 9.6 mg/dL (ref 8.4–10.5)
Creatinine, Ser: 1.05 mg/dL (ref 0.50–1.35)
GFR calc Af Amer: 75 mL/min — ABNORMAL LOW (ref >90)
GFR calc non Af Amer: 64 mL/min — ABNORMAL LOW (ref >90)
Glucose, Bld: 116 mg/dL — ABNORMAL HIGH (ref 70–99)
Potassium: 4.4 mEq/L (ref 3.5–5.1)

## 2012-05-29 MED ORDER — POTASSIUM CHLORIDE CRYS ER 20 MEQ PO TBCR: 20.0000 meq | EXTENDED_RELEASE_TABLET | Freq: Every day | ORAL | Status: DC

## 2012-05-29 MED ORDER — LISINOPRIL 2.5 MG PO TABS: 2.5000 mg | ORAL_TABLET | Freq: Every day | ORAL | Status: DC

## 2012-05-29 MED ORDER — CARVEDILOL 3.125 MG PO TABS: 3.1250 mg | ORAL_TABLET | Freq: Two times a day (BID) | ORAL | Status: DC

## 2012-05-29 MED ORDER — FUROSEMIDE 40 MG PO TABS: 40.0000 mg | ORAL_TABLET | Freq: Every day | ORAL | Status: DC

## 2012-05-29 NOTE — Progress Notes (Signed)
05/29/12 Patient discharged with all paperwork. Patient verbalizes understanding of all discharge paperwork. Catha Gosselin RN

## 2012-05-29 NOTE — Discharge Summary (Signed)
Discharge Summary   Patient ID: Marcus [name] MRN: [id number], DOB/AGE: [birth year] 79 y.o.  Primary MD:  Primary Cardiologist: Mertie Moores MD Admit date: 05/24/2012 D/C date:     05/29/2012      Primary Discharge Diagnoses:  1. Acute on Chronic Systolic CHF  - Echo moderate LV dysfunction with EF 35-40%  - Initiated on ACEI, BB  - Dc'd on Lasix 40mg  daily  2. Community Acquired Pneumonia  - 5 day course of Azithromycin   3. Coronary Artery Disease  - No objective evidence of acute cardiac ischemia  - Consider addition of statin    4. Hypokalemia   Secondary Discharge Diagnoses:  . Coronary artery disease     a. s/p remote CABG x 2(VG->OM, LIMA->LAD;  b. Late 90's s/p PCI of RCA;  c. 12/09 Cath/PCI: LM 53m, 70-80d (3.0x24mm Xience DES), LAD100p, LCX 80-90p (2.25x48mmTaxus Atom DES), RCA 100d (2.5x14mm Xience DES), VG->OM 100, LIMA->LAD nl, EF 30-35%.  . Hyperlipidemia   . Prostate cancer   . PAF (paroxysmal atrial fibrillation)   . Ischemic cardiomyopathy     a. EF 35-40% by echo 05/2012  . Symptomatic bradycardia     a. 08/2003 s/p MDT Enpulse E2DR01 Dual chamber PPM ser # [id number].     Allergies No Known Allergies  Diagnostic Studies/Procedures:  05/25/12 - Echo Study Conclusions - Left ventricle: The cavity size was normal. Systolic function was moderately reduced. The estimated ejection fraction was in the range of 35% to 40%. Moderate diffuse hypokinesis. Possible moderate hypokinesis of the inferior myocardium. - Mitral valve: Mild regurgitation. - Left atrium: The atrium was moderately to severely dilated. - Right atrium: The atrium was mildly dilated   History of Present Illness: 79 y.o. male w/ the above medical problems who presented to [facility] on 05/24[facility]12 with complaints of progressive dyspnea, LE edema, orthopnea, fever and cough.  Hospital Course: In the ED, EKG revealed NSR with no acute ST/T changes. CXR showed pulmonary edema and  question of pneumonia. Labs were significant for normal poc troponin, pBNP 8552, normal WBC. He was admitted for further evaluation and treatment. Cardiac enzymes were cycled and remained negative. He was gently diuresed with improvement in volume status and symptoms (weight 148 --> 139lbs). ACEI and BB were initiated. Echo showed EF 35-40%, mod diffuse LV hypokinesis, possible mod hypokinesis of the inferior Myocardium, and mod-severe LAE. He was treated with azithromycin for suspected CAP with plans to complete 5 day course.  He was seen and evaluated by Dr. Acie Fredrickson who felt he was stable for discharge home with plans for follow up as scheduled below.  Discharge Vitals: Blood pressure 114/59, pulse 62, temperature 98.5 F (36.9 C), temperature source Oral, resp. rate 19, height 5\' 10"  (1.778 m), weight 139 lb 12.4 oz (63.4 kg), SpO2 96.00%.  Labs: Component Value Date   WBC 7.0 05/26/2012   HGB 10.9* 05/26/2012   HCT 31.9* 05/26/2012   MCV 93.0 05/26/2012   PLT 135* 05/26/2012    Lab 05/29/12 0600 05/25/12 0510  NA 139 --  K 4.4 --  CL 100 --  CO2 27 --  BUN 27* --  CREATININE 1.05 --  CALCIUM 9.6 --  PROT -- 6.1  BILITOT -- 1.2  ALKPHOS -- 49  ALT -- 16  AST -- 20  GLUCOSE 116* --     05/24/2012 11:19  Pro B Natriuretic peptide (BNP) 8552.0 (H)     05/24/2012 17:45 05/24/2012 22:36 05/25/2012 05:15  Troponin  I <0.30 <0.30 <0.30    05/24/2012 17:45  TSH 0.736    Discharge Medications     Medication List     As of 05/29/2012 10:21 AM    TAKE these medications         aspirin 81 MG tablet   Take 81 mg by mouth daily.      CALCIUM PO   Take 1 tablet by mouth daily.      carvedilol 3.125 MG tablet   Commonly known as: COREG   Take 1 tablet (3.125 mg total) by mouth 2 (two) times daily with a meal.      COQ10 PO   Take 1 tablet by mouth daily.      fish oil-omega-3 fatty acids 1000 MG capsule   Take 1 g by mouth daily.      furosemide 40 MG tablet   Commonly known as: LASIX     Take 1 tablet (40 mg total) by mouth daily.      KRILL OIL PO   Take by mouth daily.      lisinopril 2.5 MG tablet   Commonly known as: PRINIVIL,ZESTRIL   Take 1 tablet (2.5 mg total) by mouth daily.      LYCOPENE PO   Take 1 tablet by mouth daily.      niacin 500 MG CR tablet   Commonly known as: NIASPAN   Take 2,000 mg by mouth at bedtime.      potassium chloride SA 20 MEQ tablet   Commonly known as: K-DUR,KLOR-CON   Take 1 tablet (20 mEq total) by mouth daily.      Selenium 200 MCG Caps   Take by mouth daily.      VITAMIN C PO   Take 1 tablet by mouth daily.      VITAMIN D PO   Take 1 capsule by mouth daily.      VITAMIN E PO   Take 1 capsule by mouth daily.          Disposition   Discharge Orders    Future Appointments: Provider: Department: Dept Phone: Center:   06/04/2012 10:00 AM Burtis Junes, NP [place]) [phone] [facility]   08/11/2012 9:40 AM [facility] Main Office [place]) [phone] [facility]   08/15/2012 9:30 AM Thayer Headings, MD [place]) [phone] [facility]     Future Orders Please Complete By Expires   Diet - low sodium heart healthy      Increase activity slowly      Discharge instructions      Comments:   * Please take all medications as prescribed and bring them with you to your office visit     Follow-up Information    Follow up with Truitt Merle, NP. On 06/04/2012. (10:00)    Contact information:   [facility] [place]. [address] [place] [address] [phone]       Follow up with Darden Amber., MD. On 08/15/2012. (9:30)    Contact information:   [facility] [id number] [place]. [address] [place] [address] [phone]            Outstanding Labs/Studies:  None  Duration of Discharge Encounter: Greater than 30 minutes including physician and PA time.  Signed, HOPE, JESSICA  PA-C 05/29/2012, 10:21 AM  Attending Note: See my note from earlier today.  Mertie Moores, MD

## 2012-05-29 NOTE — Progress Notes (Signed)
Patient ID: Marcus [name], male   DOB: 01-01-1929, 79 y.o.   MRN: [id number] Subjective:  Pt is feeling well  Objective:  Vital Signs in the last 24 hours: Temp:  [97.1 F (36.2 C)-98.5 F (36.9 C)] 98.5 F (36.9 C) (01/09 0554) Pulse Rate:  [61-72] 62  (01/09 0554) Resp:  [19-20] 19  (01/09 0554) BP: (76-130)/(50-63) 114/59 mmHg (01/09 0554) SpO2:  [96 %-97 %] 96 % (01/09 0554) Weight:  [139 lb 12.4 oz (63.4 kg)] 139 lb 12.4 oz (63.4 kg) (01/09 0554)  Intake/Output from previous day: 01/08 0701 - 01/09 0700 In: 940 [P.O.:940] Out: 850 [Urine:850] Intake/Output from this shift:    Physical Exam: Well appearing elderly man, NAD HEENT: Unremarkable Neck:  no JVD, no thyromegally Lungs:  Clear this am HEART:  Regular rate rhythm, no murmurs Abd:  soft, positive bowel sounds, no organomegally, no rebound, no guarding Ext:  2 plus pulses, trace ankle edema, no cyanosis, no clubbing Skin:  No rashes no nodules Neuro:  CN II through XII intact, motor grossly intact  Lab Results: No results found for this basename: WBC:2,HGB:2,PLT:2 in the last 72 hours  Basename 05/29/12 0600 05/28/12 1028  NA 139 139  K 4.4 3.9  CL 100 99  CO2 27 30  GLUCOSE 116* 135*  BUN 27* 31*  CREATININE 1.05 1.21   No results found for this basename: TROPONINI:2,CK,MB:2 in the last 72 hours Hepatic Function Panel No results found for this basename: PROT,ALBUMIN,AST,ALT,ALKPHOS,BILITOT,BILIDIR,IBILI in the last 72 hours No results found for this basename: CHOL in the last 72 hours No results found for this basename: PROTIME in the last 72 hours  Imaging: No results found.  Cardiac Studies: Tele - atrial pacing Assessment/Plan:  1. Acute on chronic heart failure-  He is stable DC on current medications.  He will see Truitt Merle, NP in 2 weeks and will see me in 1-2 months.  2. PAF - A pacing  3. Possible pneumonia- azithromycin for a total of 5 days- he needs today's dose and then 1 for  tomorrow.  It may be possible for pharmacy to give him 1 pill to take home. 4. Hypokalemia: improved after extra Kdur yesterday    LOS: 5 days    Mertie Moores Jr.,M.D. 05/29/2012, 7:42 AM

## 2012-05-29 NOTE — Progress Notes (Signed)
Utilization Review Completed.   Victorian Gunn, RN, BSN Nurse Case Manager  [phone]  

## 2012-05-30 ENCOUNTER — Telehealth: Payer: Self-pay | Admitting: Cardiovascular Disease

## 2012-05-30 NOTE — Telephone Encounter (Signed)
Transitional care call, 4 new med's reviewed and all were available, discussed salt avoidence and high salt food items, advised pill minder use, pt is aware of app with Cecille Rubin NP, told pt to call with any questions and was accepting of information, pt agreed to plan.

## 2012-05-30 NOTE — Telephone Encounter (Signed)
New problem:   Janett Billow PA called on 1/9 from hospital to discharge patient -  Transition  of care patient. Forgot to forward message over to the nurse per protocol . Patient has appt on  1/15 @ 10:00 am .  Message forward to nurse for review.

## 2012-06-04 ENCOUNTER — Encounter: Payer: Self-pay | Admitting: Nurse Practitioner

## 2012-06-04 ENCOUNTER — Ambulatory Visit (INDEPENDENT_AMBULATORY_CARE_PROVIDER_SITE_OTHER): Payer: Medicare Other | Admitting: Nurse Practitioner

## 2012-06-04 VITALS — BP 110/60 | Ht 70.0 in | Wt 143.6 lb

## 2012-06-04 DIAGNOSIS — I5021 Acute systolic (congestive) heart failure: Secondary | ICD-10-CM | POA: Diagnosis not present

## 2012-06-04 DIAGNOSIS — R0609 Other forms of dyspnea: Secondary | ICD-10-CM | POA: Diagnosis not present

## 2012-06-04 DIAGNOSIS — R0600 Dyspnea, unspecified: Secondary | ICD-10-CM

## 2012-06-04 DIAGNOSIS — R0989 Other specified symptoms and signs involving the circulatory and respiratory systems: Secondary | ICD-10-CM

## 2012-06-04 LAB — CBC WITH DIFFERENTIAL/PLATELET
Basophils Absolute: 0 10*3/uL (ref 0.0–0.1)
Basophils Relative: 0.4 % (ref 0.0–3.0)
Eosinophils Absolute: 0.2 10*3/uL (ref 0.0–0.7)
Eosinophils Relative: 4 % (ref 0.0–5.0)
HCT: 34.5 % — ABNORMAL LOW (ref 39.0–52.0)
Hemoglobin: 11.5 g/dL — ABNORMAL LOW (ref 13.0–17.0)
Lymphocytes Relative: 14.4 % (ref 12.0–46.0)
Lymphs Abs: 0.8 10*3/uL (ref 0.7–4.0)
MCHC: 33.2 g/dL (ref 30.0–36.0)
MCV: 94.1 fl (ref 78.0–100.0)
Monocytes Absolute: 0.4 10*3/uL (ref 0.1–1.0)
Monocytes Relative: 7.8 % (ref 3.0–12.0)
Neutro Abs: 4.1 10*3/uL (ref 1.4–7.7)
Neutrophils Relative %: 73.4 % (ref 43.0–77.0)
Platelets: 252 10*3/uL (ref 150.0–400.0)
RBC: 3.67 Mil/uL — ABNORMAL LOW (ref 4.22–5.81)
RDW: 15.1 % — ABNORMAL HIGH (ref 11.5–14.6)
WBC: 5.5 10*3/uL (ref 4.5–10.5)

## 2012-06-04 LAB — BASIC METABOLIC PANEL
BUN: 29 mg/dL — ABNORMAL HIGH (ref 6–23)
CO2: 30 mEq/L (ref 19–32)
Calcium: 9.5 mg/dL (ref 8.4–10.5)
Chloride: 100 mEq/L (ref 96–112)
Creatinine, Ser: 1.2 mg/dL (ref 0.4–1.5)
GFR: 62.93 mL/min (ref >60.00)
Glucose, Bld: 104 mg/dL — ABNORMAL HIGH (ref 70–99)
Potassium: 4.4 mEq/L (ref 3.5–5.1)
Sodium: 136 mEq/L (ref 135–145)

## 2012-06-04 LAB — BRAIN NATRIURETIC PEPTIDE: Pro B Natriuretic peptide (BNP): 761 pg/mL — ABNORMAL HIGH (ref 0.0–100.0)

## 2012-06-04 MED ORDER — APIXABAN 5 MG PO TABS: 5.0000 mg | ORAL_TABLET | Freq: Two times a day (BID) | ORAL | Status: DC

## 2012-06-04 NOTE — Progress Notes (Signed)
Marcus [name] Date of Birth: 04-Oct-1928 Medical Record [id number]  History of Present Illness: Marcus [name] is seen back today for a post hospital visit. He is seen for Dr. Acie Fredrickson. He has an ischemic CM with an EF of 35 to 40% per recent echo, known CAD with remote CABG/PCI - managed medically, HLD, prostate cancer, underlying pacemaker due to bradycardia and HLD.   He was most recently admitted with progressive dyspnea, cough, fever and edema. Was diuresed. Given antibiotics. No objective evidence of ischemia noted. EF is 35 to 40%. ACE was started along with beta blocker therapy.   He comes in today. He is here with his wife. She provides a lot of the history and says he "just doesn't say anything". While he has improved since his recent admission, still not feeling all that well. Feels listless in the mornings. Still short of breath. Still with some cough. No chest pain. Swelling has improved but not resolved. Weights are ok at home. Never used much salt. Remains fatigued. No sputum production. No fever or chills.   Current Outpatient Prescriptions on File Prior to Visit  Medication Sig Dispense Refill  . Ascorbic Acid (VITAMIN C PO) Take 1 tablet by mouth daily.       Marland Kitchen aspirin 81 MG tablet Take 81 mg by mouth daily.      Marland Kitchen CALCIUM PO Take 1 tablet by mouth daily.       . carvedilol (COREG) 3.125 MG tablet Take 1 tablet (3.125 mg total) by mouth 2 (two) times daily with a meal.  60 tablet  6  . Cholecalciferol (VITAMIN D PO) Take 1 capsule by mouth daily.       . Coenzyme Q10 (COQ10 PO) Take 1 tablet by mouth daily.       . fish oil-omega-3 fatty acids 1000 MG capsule Take 1 g by mouth daily.       . furosemide (LASIX) 40 MG tablet Take 1 tablet (40 mg total) by mouth daily.  30 tablet  6  . KRILL OIL PO Take by mouth daily.        Marland Kitchen lisinopril (PRINIVIL,ZESTRIL) 2.5 MG tablet Take 1 tablet (2.5 mg total) by mouth daily.  30 tablet  6  . LYCOPENE PO Take 1 tablet by mouth daily.        . niacin (NIASPAN) 500 MG CR tablet Take 2,000 mg by mouth at bedtime.        . potassium chloride SA (K-DUR,KLOR-CON) 20 MEQ tablet Take 1 tablet (20 mEq total) by mouth daily.  30 tablet  6  . Selenium 200 MCG CAPS Take by mouth daily.        Marland Kitchen VITAMIN E PO Take 1 capsule by mouth daily.         No Known Allergies  Past Medical History  Diagnosis Date  . Coronary artery disease     a. s/p remote CABG x 2(VG->OM, LIMA->LAD;  b. Late 90's s/p PCI of RCA;  c. 12/09 Cath/PCI: LM 58m, 70-80d (3.0x93mm Xience DES), LAD100p, LCX 80-90p (2.25x2mmTaxus Atom DES), RCA 100d (2.5x75mm Xience DES), VG->OM 100, LIMA->LAD nl, EF 30-35%.  . Hyperlipidemia   . Prostate cancer   . PAF (paroxysmal atrial fibrillation)   . Ischemic cardiomyopathy     a. EF 35-40% by echo 05/2012  . Symptomatic bradycardia     a. 08/2003 s/p MDT Enpulse E2DR01 Dual chamber PPM ser # [id number] H.  . Systolic CHF     Past  Surgical History  Procedure Date  . Coronary artery bypass graft   . Insert / replace / remove pacemaker   . Doppler echocardiography 06/24/2002    EF 60-65%    History  Smoking status  . Former Smoker  . Quit date: 02/10/1971  Smokeless tobacco  . Not on file    History  Alcohol Use No    Family History  Problem Relation Age of Onset  . Hypertension Mother   . Cancer Mother   . Alcohol abuse Father     Review of Systems: The review of systems is per the HPI.  All other systems were reviewed and are negative.  Physical Exam: BP 110/60  Ht 5\' 10"  (1.778 m)  Wt 143 lb 9.6 oz (65.137 kg)  BMI 20.60 kg/m2 Patient is very pleasant and in no acute distress. He is quite thin. Skin is warm and dry. Color is normal.  HEENT is unremarkable. Normocephalic/atraumatic. PERRL. Sclera are nonicteric. Neck is supple. No masses. No JVD. Lungs with basilar rales. Cardiac exam shows a regular rate and rhythm. He has several ectopics. Abdomen is soft. Extremities are with 1+ edema. Gait and ROM are  intact. No gross neurologic deficits noted.   LABORATORY DATA: Pending for today.   Lab Results  Component Value Date   WBC 7.0 05/26/2012   HGB 10.9* 05/26/2012   HCT 31.9* 05/26/2012   PLT 135* 05/26/2012   GLUCOSE 116* 05/29/2012   CHOL 175 02/28/2011   TRIG 37.0 02/28/2011   HDL 77.40 02/28/2011   LDLCALC 90 02/28/2011   ALT 16 05/25/2012   AST 20 05/25/2012   NA 139 05/29/2012   K 4.4 05/29/2012   CL 100 05/29/2012   CREATININE 1.05 05/29/2012   BUN 27* 05/29/2012   CO2 27 05/29/2012   TSH 0.736 05/24/2012   INR 1.14 05/24/2012   Echo Study Conclusions from January 2014  - Left ventricle: The cavity size was normal. Systolic function was moderately reduced. The estimated ejection fraction was in the range of 35% to 40%. Moderate diffuse hypokinesis. Possible moderate hypokinesis of the inferior myocardium. - Mitral valve: Mild regurgitation. - Left atrium: The atrium was moderately to severely dilated. - Right atrium: The atrium was mildly dilated.   Assessment / Plan:  1. Ischemic CM - with recent acute systolic exacerbation along with pneumonia - still with symptoms. Has rales on lung exam and still with some swelling. I am increasing his Lasix today to 60 mg for the next 4 days. Check follow up labs today.   2. CAD - no chest pain reported.   3. Pacemaker - this is checked today. Has atrial flutter noted on his check today. Looks like it started back on the 7th. This was probably the trigger for his CHF event. He was initially a little hesitant to start anticoagulation and actually wants to stop all of his medicines. After more discussion he is agreeable to this current regimen.  I am starting Eliquis 5 mg BID. May need to consider cardioversion.   Dr. Acie Fredrickson and I will see him back in about a week. We need to check labs today. He was a little anemic. Lasix is increased. Continue with salt restriction and daily weights.   Patient is agreeable to this plan and will call if any problems  develop in the interim.

## 2012-06-04 NOTE — Patient Instructions (Addendum)
We will check your pacemaker today - this does show that you are out of rhythm. This is probably what triggered your heart failure  We need to check labs today  I want you to increase your Lasix to 1 1/2 pills for the next 4 days and then back to one a day  We are adding Eliquis 5 mg two times a day - this is a blood thinner to prevent stroke  Stay on your other medicines  Avoid salt  Dr. Acie Fredrickson and I will see you in a week  Call the [place] office at [phone] if you have any questions, problems or concerns.

## 2012-06-11 ENCOUNTER — Encounter: Payer: Self-pay | Admitting: Nurse Practitioner

## 2012-06-11 ENCOUNTER — Ambulatory Visit (INDEPENDENT_AMBULATORY_CARE_PROVIDER_SITE_OTHER): Payer: Medicare Other | Admitting: Nurse Practitioner

## 2012-06-11 VITALS — BP 120/62 | HR 76 | Ht 70.0 in | Wt 145.5 lb

## 2012-06-11 DIAGNOSIS — I255 Ischemic cardiomyopathy: Secondary | ICD-10-CM

## 2012-06-11 DIAGNOSIS — I2589 Other forms of chronic ischemic heart disease: Secondary | ICD-10-CM

## 2012-06-11 LAB — CBC WITH DIFFERENTIAL/PLATELET
Basophils Absolute: 0 10*3/uL (ref 0.0–0.1)
Basophils Relative: 0.8 % (ref 0.0–3.0)
Eosinophils Absolute: 0.2 10*3/uL (ref 0.0–0.7)
Eosinophils Relative: 3.6 % (ref 0.0–5.0)
HCT: 32.5 % — ABNORMAL LOW (ref 39.0–52.0)
Hemoglobin: 10.7 g/dL — ABNORMAL LOW (ref 13.0–17.0)
Lymphocytes Relative: 17.9 % (ref 12.0–46.0)
Lymphs Abs: 1.1 10*3/uL (ref 0.7–4.0)
MCHC: 33 g/dL (ref 30.0–36.0)
MCV: 94.7 fl (ref 78.0–100.0)
Monocytes Absolute: 0.5 10*3/uL (ref 0.1–1.0)
Monocytes Relative: 9 % (ref 3.0–12.0)
Neutro Abs: 4.2 10*3/uL (ref 1.4–7.7)
Neutrophils Relative %: 68.7 % (ref 43.0–77.0)
Platelets: 206 10*3/uL (ref 150.0–400.0)
RBC: 3.43 Mil/uL — ABNORMAL LOW (ref 4.22–5.81)
RDW: 15.8 % — ABNORMAL HIGH (ref 11.5–14.6)
WBC: 6.1 10*3/uL (ref 4.5–10.5)

## 2012-06-11 LAB — BASIC METABOLIC PANEL
BUN: 27 mg/dL — ABNORMAL HIGH (ref 6–23)
CO2: 30 mEq/L (ref 19–32)
Calcium: 9.2 mg/dL (ref 8.4–10.5)
Chloride: 99 mEq/L (ref 96–112)
Creatinine, Ser: 1.2 mg/dL (ref 0.4–1.5)
GFR: 64.82 mL/min (ref >60.00)
Glucose, Bld: 105 mg/dL — ABNORMAL HIGH (ref 70–99)
Potassium: 3.9 mEq/L (ref 3.5–5.1)
Sodium: 135 mEq/L (ref 135–145)

## 2012-06-11 NOTE — Patient Instructions (Addendum)
Stay on your current medicines, especially your Eliquis  Continue to avoid salt  Keep weighing every day  We will check labs today  We will be planning a cardioversion in about 3 weeks if you remain out of rhythm   Dr. Acie Fredrickson will see you in 3 weeks  Call the [place] office at [phone] if you have any questions, problems or concerns.

## 2012-06-11 NOTE — Progress Notes (Signed)
Marcus [name] Date of Birth: Feb 26, 1929 Medical Record [id number]  History of Present Illness: Marcus [name] is seen back today for a one week check. He is seen for Dr. Acie Fredrickson. He has an ischemic CM with an EF of 35 to 40% per recent echo, known CAD remote CABG/PCI - managed medically, HLD, prostate cancer, underlying pacemaker due to bradycardia.   He was most recently admitted with CHF and cough, fever. Treated with diuretics and antibiotics. When I saw him back I got his pacemaker checked. He was in atrial flutter and the dates correlated with his recent admission. This may have been the trigger.  I started Eliquis. He was quite unsure as to taking it.   He comes back today. He is here with his wife. Feels "crummy" per the wife. He is tired. Not really short of breath. Cough has almost resolved. No chest pain. No swelling. Weight has been fairly stable at home. No extra lasix. Trying to watch his salt. Not dizzy or lightheaded. He says he is taking his medicines.   Current Outpatient Prescriptions on File Prior to Visit  Medication Sig Dispense Refill  . apixaban (ELIQUIS) 5 MG TABS tablet Take 1 tablet (5 mg total) by mouth 2 (two) times daily.  60 tablet  6  . Ascorbic Acid (VITAMIN C PO) Take 1 tablet by mouth daily.       Marland Kitchen aspirin 81 MG tablet Take 81 mg by mouth daily.      Marland Kitchen CALCIUM PO Take 1 tablet by mouth daily.       . carvedilol (COREG) 3.125 MG tablet Take 1 tablet (3.125 mg total) by mouth 2 (two) times daily with a meal.  60 tablet  6  . Cholecalciferol (VITAMIN D PO) Take 1 capsule by mouth daily.       . Coenzyme Q10 (COQ10 PO) Take 1 tablet by mouth daily.       . fish oil-omega-3 fatty acids 1000 MG capsule Take 1 g by mouth daily.       . furosemide (LASIX) 40 MG tablet Take 1 tablet (40 mg total) by mouth daily.  30 tablet  6  . KRILL OIL PO Take by mouth daily.        Marland Kitchen lisinopril (PRINIVIL,ZESTRIL) 2.5 MG tablet Take 1 tablet (2.5 mg total) by mouth daily.  30  tablet  6  . LYCOPENE PO Take 1 tablet by mouth daily.       . niacin (NIASPAN) 500 MG CR tablet Take 2,000 mg by mouth at bedtime.        . potassium chloride SA (K-DUR,KLOR-CON) 20 MEQ tablet Take 1 tablet (20 mEq total) by mouth daily.  30 tablet  6  . Selenium 200 MCG CAPS Take by mouth daily.        Marland Kitchen VITAMIN E PO Take 1 capsule by mouth daily.         No Known Allergies  Past Medical History  Diagnosis Date  . Coronary artery disease     a. s/p remote CABG x 2(VG->OM, LIMA->LAD;  b. Late 90's s/p PCI of RCA;  c. 12/09 Cath/PCI: LM 63m, 70-80d (3.0x70mm Xience DES), LAD100p, LCX 80-90p (2.25x88mmTaxus Atom DES), RCA 100d (2.5x34mm Xience DES), VG->OM 100, LIMA->LAD nl, EF 30-35%.  . Hyperlipidemia   . Prostate cancer   . PAF (paroxysmal atrial fibrillation)   . Ischemic cardiomyopathy     a. EF 35-40% by echo 05/2012  . Symptomatic bradycardia  a. 08/2003 s/p MDT Enpulse [id number] Dual chamber PPM ser # [id number] H.  . Systolic CHF     Past Surgical History  Procedure Date  . Coronary artery bypass graft   . Insert / replace / remove pacemaker   . Doppler echocardiography 06/24/2002    EF 60-65%    History  Smoking status  . Former Smoker  . Quit date: 02/10/1971  Smokeless tobacco  . Not on file    History  Alcohol Use No    Family History  Problem Relation Age of Onset  . Hypertension Mother   . Cancer Mother   . Alcohol abuse Father     Review of Systems: The review of systems is per the HPI.  All other systems were reviewed and are negative.  Physical Exam: BP 120/62  Pulse 76  Ht 5\' 10"  (1.778 m)  Wt 145 lb 8 oz (65.998 kg)  BMI 20.88 kg/m2 Patient is alert, looks chronically ill but in no acute distress. Skin is warm and dry. Color is normal.  HEENT is unremarkable. Normocephalic/atraumatic. PERRL. Sclera are nonicteric. Neck is supple. No masses. No JVD. Lungs are clear. Cardiac exam shows an irregular rhythm. Rate is controlled. Abdomen is soft.  Extremities are without edema. Gait and ROM are intact. No gross neurologic deficits noted.   LABORATORY DATA: Lab Results  Component Value Date   WBC 5.5 06/04/2012   HGB 11.5* 06/04/2012   HCT 34.5* 06/04/2012   PLT 252.0 06/04/2012   GLUCOSE 104* 06/04/2012   CHOL 175 02/28/2011   TRIG 37.0 02/28/2011   HDL 77.40 02/28/2011   LDLCALC 90 02/28/2011   ALT 16 05/25/2012   AST 20 05/25/2012   NA 136 06/04/2012   K 4.4 06/04/2012   CL 100 06/04/2012   CREATININE 1.2 06/04/2012   BUN 29* 06/04/2012   CO2 30 06/04/2012   TSH 0.736 05/24/2012   INR 1.14 05/24/2012   I have checked his pacemaker today. He remains out of rhythm on interrogation (past 18 days). Battery life is 4 months.   Assessment / Plan: 1. Ischemic CM - seems fairly compensated.  2. Atrial fib/flutter - now on Eliquis - this is probably why he does not feel well. He says he is taking his Eliquis. Will have Dr. Acie Fredrickson see him today as well (done). We will plan for cardioversion in about 3 more weeks as long as he stays on his anticoagulation.   3. CAD - no chest pain   We will see him back in about 3 weeks. Recheck labs today. Plan for cardioversion at that time. This procedure has been introduced to both he and his wife. Reminded him numerous times about the importance of staying on the Eliquis.   Patient is agreeable to this plan and will call if any problems develop in the interim.

## 2012-06-16 ENCOUNTER — Other Ambulatory Visit (INDEPENDENT_AMBULATORY_CARE_PROVIDER_SITE_OTHER): Payer: Medicare Other

## 2012-06-16 ENCOUNTER — Telehealth: Payer: Self-pay | Admitting: *Deleted

## 2012-06-16 ENCOUNTER — Other Ambulatory Visit: Payer: Self-pay | Admitting: *Deleted

## 2012-06-16 DIAGNOSIS — D649 Anemia, unspecified: Secondary | ICD-10-CM

## 2012-06-16 LAB — CBC WITH DIFFERENTIAL/PLATELET
Basophils Absolute: 0 10*3/uL (ref 0.0–0.1)
Basophils Relative: 0.7 % (ref 0.0–3.0)
Eosinophils Absolute: 0.2 10*3/uL (ref 0.0–0.7)
Eosinophils Relative: 6.8 % — ABNORMAL HIGH (ref 0.0–5.0)
HCT: 28.7 % — ABNORMAL LOW (ref 39.0–52.0)
Hemoglobin: 9.6 g/dL — ABNORMAL LOW (ref 13.0–17.0)
Lymphocytes Relative: 18 % (ref 12.0–46.0)
Lymphs Abs: 0.7 10*3/uL (ref 0.7–4.0)
MCHC: 33.3 g/dL (ref 30.0–36.0)
MCV: 94.3 fl (ref 78.0–100.0)
Monocytes Absolute: 0.4 10*3/uL (ref 0.1–1.0)
Monocytes Relative: 11.7 % (ref 3.0–12.0)
Neutro Abs: 2.3 10*3/uL (ref 1.4–7.7)
Neutrophils Relative %: 62.8 % (ref 43.0–77.0)
Platelets: 118 10*3/uL — ABNORMAL LOW (ref 150.0–400.0)
RBC: 3.05 Mil/uL — ABNORMAL LOW (ref 4.22–5.81)
RDW: 15.6 % — ABNORMAL HIGH (ref 11.5–14.6)
WBC: 3.6 10*3/uL — ABNORMAL LOW (ref 4.5–10.5)

## 2012-06-16 NOTE — Telephone Encounter (Signed)
t/w pt wife, says pt thinks eliquis is making him bleed is coming into to pick up stool cards and repeat blood work today

## 2012-06-17 ENCOUNTER — Other Ambulatory Visit: Payer: Self-pay | Admitting: *Deleted

## 2012-06-17 DIAGNOSIS — Z79899 Other long term (current) drug therapy: Secondary | ICD-10-CM

## 2012-06-19 ENCOUNTER — Telehealth: Payer: Self-pay | Admitting: Cardiovascular Disease

## 2012-06-19 NOTE — Telephone Encounter (Signed)
Pt only placed a sample on one card vs the three included. The sample that was used was a large sample and they were unable to read. Pt informed and will pick up new cards tomorrow.

## 2012-06-19 NOTE — Telephone Encounter (Signed)
Left msg i will try later to contact her.

## 2012-06-19 NOTE — Telephone Encounter (Signed)
New Problem    Following up on stool sample results and would like to speak to nurse.

## 2012-06-19 NOTE — Telephone Encounter (Signed)
[facility] was contacted and they do have stool card, they will call results, wife informed that I will call her back today with results. She agreed to plan.

## 2012-06-19 NOTE — Telephone Encounter (Signed)
New cards placed in lab here, pt will come for labs

## 2012-06-20 ENCOUNTER — Telehealth: Payer: Self-pay | Admitting: *Deleted

## 2012-06-20 ENCOUNTER — Other Ambulatory Visit (INDEPENDENT_AMBULATORY_CARE_PROVIDER_SITE_OTHER): Payer: Medicare Other

## 2012-06-20 DIAGNOSIS — D62 Acute posthemorrhagic anemia: Secondary | ICD-10-CM

## 2012-06-20 DIAGNOSIS — Z79899 Other long term (current) drug therapy: Secondary | ICD-10-CM | POA: Diagnosis not present

## 2012-06-20 LAB — CBC WITH DIFFERENTIAL/PLATELET
Basophils Absolute: 0 10*3/uL (ref 0.0–0.1)
Basophils Relative: 0.8 % (ref 0.0–3.0)
Eosinophils Absolute: 0.2 10*3/uL (ref 0.0–0.7)
Eosinophils Relative: 6.1 % — ABNORMAL HIGH (ref 0.0–5.0)
HCT: 28.5 % — ABNORMAL LOW (ref 39.0–52.0)
Hemoglobin: 9.5 g/dL — ABNORMAL LOW (ref 13.0–17.0)
Lymphocytes Relative: 18.5 % (ref 12.0–46.0)
Lymphs Abs: 0.7 10*3/uL (ref 0.7–4.0)
MCHC: 33.3 g/dL (ref 30.0–36.0)
MCV: 95.2 fl (ref 78.0–100.0)
Monocytes Absolute: 0.4 10*3/uL (ref 0.1–1.0)
Monocytes Relative: 9.8 % (ref 3.0–12.0)
Neutro Abs: 2.6 10*3/uL (ref 1.4–7.7)
Neutrophils Relative %: 64.8 % (ref 43.0–77.0)
Platelets: 114 10*3/uL — ABNORMAL LOW (ref 150.0–400.0)
RBC: 2.99 Mil/uL — ABNORMAL LOW (ref 4.22–5.81)
RDW: 16.5 % — ABNORMAL HIGH (ref 11.5–14.6)
WBC: 4 10*3/uL — ABNORMAL LOW (ref 4.5–10.5)

## 2012-06-20 NOTE — Telephone Encounter (Signed)
S/w pt wife stated husband picked up stool cards today and will drop them off at [facility] after 3 stool samples, also stated pt will come in on 06/27/12 for repeat blood work pts wife verbally understood instructions

## 2012-06-24 ENCOUNTER — Other Ambulatory Visit: Payer: Medicare Other

## 2012-06-24 LAB — HEMOCCULT SLIDES (X 3 CARDS)
OCCULT 1: NEGATIVE
OCCULT 2: NEGATIVE
OCCULT 4: NEGATIVE

## 2012-06-24 NOTE — Telephone Encounter (Signed)
New Problem:    Patient's wife called in concerned about her husband is experiencing SOB and fatigue.  Patient had chest pain all day Saturday.  Patient has been feeling ill for the past few days and wife would like patient to be seen today.  Please call back.

## 2012-06-25 ENCOUNTER — Ambulatory Visit (INDEPENDENT_AMBULATORY_CARE_PROVIDER_SITE_OTHER): Payer: Medicare Other | Admitting: Cardiovascular Disease

## 2012-06-25 ENCOUNTER — Encounter: Payer: Self-pay | Admitting: Cardiovascular Disease

## 2012-06-25 ENCOUNTER — Encounter: Payer: Self-pay | Admitting: *Deleted

## 2012-06-25 ENCOUNTER — Ambulatory Visit: Payer: Medicare Other | Admitting: Nurse Practitioner

## 2012-06-25 VITALS — BP 84/50 | HR 72 | Temp 97.0°F | Ht 70.0 in | Wt 147.0 lb

## 2012-06-25 DIAGNOSIS — I4891 Unspecified atrial fibrillation: Secondary | ICD-10-CM

## 2012-06-25 DIAGNOSIS — D649 Anemia, unspecified: Secondary | ICD-10-CM

## 2012-06-25 LAB — CBC WITH DIFFERENTIAL/PLATELET
Basophils Absolute: 0 10*3/uL (ref 0.0–0.1)
HCT: 28.8 % — ABNORMAL LOW (ref 39.0–52.0)
Lymphs Abs: 0.9 10*3/uL (ref 0.7–4.0)
MCV: 94.4 fl (ref 78.0–100.0)
Monocytes Absolute: 0.2 10*3/uL (ref 0.1–1.0)
Platelets: 153 10*3/uL (ref 150.0–400.0)
RDW: 16.6 % — ABNORMAL HIGH (ref 11.5–14.6)

## 2012-06-25 LAB — HEPATIC FUNCTION PANEL
Albumin: 3.2 g/dL — ABNORMAL LOW (ref 3.5–5.2)
Total Protein: 6.4 g/dL (ref 6.0–8.3)

## 2012-06-25 LAB — BASIC METABOLIC PANEL
BUN: 30 mg/dL — ABNORMAL HIGH (ref 6–23)
GFR: 59.42 mL/min — ABNORMAL LOW (ref >60.00)
Glucose, Bld: 96 mg/dL (ref 70–99)
Potassium: 3.7 mEq/L (ref 3.5–5.1)

## 2012-06-25 LAB — PROTIME-INR: Prothrombin Time: 17.2 s — ABNORMAL HIGH (ref 10.2–12.4)

## 2012-06-25 NOTE — Assessment & Plan Note (Signed)
His hemoglobin continues to fall. I've asked him to see his medical doctor for further evaluation.

## 2012-06-25 NOTE — Progress Notes (Signed)
Marcus [name] Date of Birth: 12-Apr-1929 Medical Record [id number]  Problems: 1. Coronary artery disease- status post CABG and PCI 2. Pacemaker placement 3. History of prostate cancer 4. Ischemic cardiopathy with an ejection fraction of 35-40% 5. Atrial fibrillation  History of Present Illness: Marcus [name] is seen back today for a work in visit . He has an ischemic CM with an EF of 35 to 40% per recent echo, known CAD remote CABG/PCI - managed medically, HLD, prostate cancer, underlying pacemaker due to bradycardia.   He was most recently admitted with CHF and cough, fever. Treated with diuretics and antibiotics. When I saw him back I got his pacemaker checked. He was in atrial flutter and the dates correlated with his recent admission. This may have been the trigger.  I started Eliquis. He was quite unsure as to taking it.   He comes back today. He is here with his wife. Feels "crummy" per the wife. He is tired. Not really short of breath. Cough has almost resolved. No chest pain. No swelling. Weight has been fairly stable at home. No extra lasix. Trying to watch his salt. Not dizzy or lightheaded. He says he is taking his medicines.   Feb. 5, 2014: He has had more "bad" days than "good" days.  He has been having some worsening dyspnea recently.  He denies any PND or orthopnea.  He is short of breath at rest - worsens with any exertion.   These symptoms have been gradually and progressively worsening over the past week or so.  Current Outpatient Prescriptions on File Prior to Visit  Medication Sig Dispense Refill  . apixaban (ELIQUIS) 5 MG TABS tablet Take 1 tablet (5 mg total) by mouth 2 (two) times daily.  60 tablet  6  . Ascorbic Acid (VITAMIN C PO) Take 1 tablet by mouth daily.       Marland Kitchen aspirin 81 MG tablet Take 81 mg by mouth daily.      Marland Kitchen CALCIUM PO Take 1 tablet by mouth daily.       . carvedilol (COREG) 3.125 MG tablet Take 1 tablet (3.125 mg total) by mouth 2 (two) times  daily with a meal.  60 tablet  6  . Cholecalciferol (VITAMIN D PO) Take 1 capsule by mouth daily.       . Coenzyme Q10 (COQ10 PO) Take 1 tablet by mouth daily.       . fish oil-omega-3 fatty acids 1000 MG capsule Take 1 g by mouth daily.       . furosemide (LASIX) 40 MG tablet Take 1 tablet (40 mg total) by mouth daily.  30 tablet  6  . KRILL OIL PO Take by mouth daily.        Marland Kitchen lisinopril (PRINIVIL,ZESTRIL) 2.5 MG tablet Take 1 tablet (2.5 mg total) by mouth daily.  30 tablet  6  . LYCOPENE PO Take 1 tablet by mouth daily.       . niacin (NIASPAN) 500 MG CR tablet Take 2,000 mg by mouth at bedtime.        . potassium chloride SA (K-DUR,KLOR-CON) 20 MEQ tablet Take 1 tablet (20 mEq total) by mouth daily.  30 tablet  6  . Selenium 200 MCG CAPS Take by mouth daily.        Marland Kitchen VITAMIN E PO Take 1 capsule by mouth daily.         No Known Allergies  Past Medical History  Diagnosis Date  . Coronary  artery disease     a. s/p remote CABG x 2(VG->OM, LIMA->LAD;  b. Late 90's s/p PCI of RCA;  c. 12/09 Cath/PCI: LM 73m, 70-80d (3.0x8mm Xience DES), LAD100p, LCX 80-90p (2.25x21mmTaxus Atom DES), RCA 100d (2.5x36mm Xience DES), VG->OM 100, LIMA->LAD nl, EF 30-35%.  . Hyperlipidemia   . Prostate cancer   . PAF (paroxysmal atrial fibrillation)   . Ischemic cardiomyopathy     a. EF 35-40% by echo 05/2012  . Symptomatic bradycardia     a. 08/2003 s/p MDT Enpulse E2DR01 Dual chamber PPM ser # [id number] H.  . Systolic CHF     Past Surgical History  Procedure Date  . Coronary artery bypass graft   . Insert / replace / remove pacemaker   . Doppler echocardiography 06/24/2002    EF 60-65%    History  Smoking status  . Former Smoker  . Quit date: 02/10/1971  Smokeless tobacco  . Not on file    History  Alcohol Use No    Family History  Problem Relation Age of Onset  . Hypertension Mother   . Cancer Mother   . Alcohol abuse Father     Review of Systems: The review of systems is per the  HPI.  All other systems were reviewed and are negative.  Physical Exam: BP 84/50  Pulse 72  Temp 97 F (36.1 C)  Ht 5\' 10"  (1.778 m)  Wt 147 lb (66.679 kg)  BMI 21.09 kg/m2  SpO2 93% Patient is alert, looks chronically ill but in no acute distress. Skin is warm and dry. Color is normal.  HEENT is unremarkable. Normocephalic/atraumatic. PERRL. Sclera are nonicteric. Neck is supple. No masses. No JVD. Lungs are clear. Cardiac exam shows an irregular rhythm. Rate is controlled. Abdomen is soft. Extremities are without edema. Gait and ROM are intact. No gross neurologic deficits noted.   LABORATORY DATA: Lab Results  Component Value Date   WBC 4.0* 06/20/2012   HGB 9.5* 06/20/2012   HCT 28.5* 06/20/2012   PLT 114.0* 06/20/2012   GLUCOSE 105* 06/11/2012   CHOL 175 02/28/2011   TRIG 37.0 02/28/2011   HDL 77.40 02/28/2011   LDLCALC 90 02/28/2011   ALT 16 05/25/2012   AST 20 05/25/2012   NA 135 06/11/2012   K 3.9 06/11/2012   CL 99 06/11/2012   CREATININE 1.2 06/11/2012   BUN 27* 06/11/2012   CO2 30 06/11/2012   TSH 0.736 05/24/2012   INR 1.14 05/24/2012   EKG: 06/25/2012: Atrial fibrillation. He has ventricular rate of 66 with intermittent ventricular pacing.  Assessment / Plan:

## 2012-06-25 NOTE — Assessment & Plan Note (Addendum)
[name] present with generalized fatigue, worsening shortness breath, persistent leg edema. He has a history of ischemic cardiopathy and also has had atrial fibrillation in the past several weeks. He has been taking his Eliquis.  We've noticed that his hemoglobin has dropped over the past month. We checked stool guaiac cards and had 5 negative studies. He denies any black or tarry stools.  This recent decompensation may be due to worsening congestive heart failure;   a fairly up-to-date echocardiogram from last month which reveals an ejection fraction of 35-40%. He may be having some worsening heart failure related to his atrial fibrillation and loss of atrial kick. I think that restoring sinus rhythm may be beneficial for him. His decompensation may also be due to a combination of his progressive anemia in the face of atrial fibrillation and chronic systolic congestive heart failure.    His blood pressure is fairly low today. We will have him hold his lisinopril. We'll check labs today including a CBC, basic metabolic profile, and hepatic profile. We'll schedule him for a TE he guided cardioversion on Friday. The hope is that we will be able to restore sinus rhythm and that he will feel better.

## 2012-06-25 NOTE — Telephone Encounter (Signed)
App made 

## 2012-06-25 NOTE — Patient Instructions (Addendum)
Your physician has recommended you make the following change in your medication: STOP LISINOPRIL  Your physician has requested that you have a TEE/Cardioversion. During a TEE, sound waves are used to create images of your heart. It provides your doctor with information about the size and shape of your heart and how well your heart's chambers and valves are working. In this test, a transducer is attached to the end of a flexible tube that is guided down you throat and into your esophagus (the tube leading from your mouth to your stomach) to get a more detailed image of your heart. Once the TEE has determined that a blood clot is not present, the cardioversion begins. Electrical Cardioversion uses a jolt of electricity to your heart either through paddles or wired patches attached to your chest. This is a controlled, usually prescheduled, procedure. This procedure is done at the hospital and you are not awake during the procedure. You usually go home the day of the procedure. Please see the instruction sheet given to you today for more information.  Your physician recommends that you return for lab work in: TODAY BMET CBC/D, PT/INR

## 2012-06-26 ENCOUNTER — Encounter (HOSPITAL_COMMUNITY): Payer: Self-pay | Admitting: *Deleted

## 2012-06-26 ENCOUNTER — Ambulatory Visit (HOSPITAL_COMMUNITY): Payer: Medicare Other | Admitting: Anesthesiology

## 2012-06-26 ENCOUNTER — Encounter (HOSPITAL_COMMUNITY): Payer: Self-pay | Admitting: Cardiology

## 2012-06-26 ENCOUNTER — Encounter (HOSPITAL_COMMUNITY)
Admission: RE | Disposition: A | Discharge status: Home / Self Care | Payer: Self-pay | Source: Ambulatory Visit | Attending: Cardiology

## 2012-06-26 ENCOUNTER — Ambulatory Visit (HOSPITAL_COMMUNITY)
Admission: RE | Admit: 2012-06-26 | Discharge: 2012-06-26 | Disposition: A | Discharge status: Home / Self Care | Payer: Medicare Other | Source: Ambulatory Visit | Attending: Cardiology | Admitting: Cardiology

## 2012-06-26 ENCOUNTER — Encounter (HOSPITAL_COMMUNITY): Payer: Self-pay | Admitting: Anesthesiology

## 2012-06-26 DIAGNOSIS — I059 Rheumatic mitral valve disease, unspecified: Secondary | ICD-10-CM | POA: Insufficient documentation

## 2012-06-26 DIAGNOSIS — I4891 Unspecified atrial fibrillation: Secondary | ICD-10-CM | POA: Insufficient documentation

## 2012-06-26 DIAGNOSIS — I359 Nonrheumatic aortic valve disorder, unspecified: Secondary | ICD-10-CM | POA: Insufficient documentation

## 2012-06-26 DIAGNOSIS — I379 Nonrheumatic pulmonary valve disorder, unspecified: Secondary | ICD-10-CM | POA: Diagnosis not present

## 2012-06-26 DIAGNOSIS — D649 Anemia, unspecified: Secondary | ICD-10-CM

## 2012-06-26 HISTORY — PX: CARDIOVERSION: SHX1299

## 2012-06-26 HISTORY — PX: TEE WITHOUT CARDIOVERSION: SHX5443

## 2012-06-26 SURGERY — ECHOCARDIOGRAM, TRANSESOPHAGEAL
Anesthesia: General

## 2012-06-26 MED ORDER — BUTAMBEN-TETRACAINE-BENZOCAINE 2-2-14 % EX AERO
INHALATION_SPRAY | CUTANEOUS | Status: DC | PRN
  Administered 2012-06-26: 2 via TOPICAL

## 2012-06-26 MED ORDER — MIDAZOLAM HCL 10 MG/2ML IJ SOLN
INTRAMUSCULAR | Status: DC | PRN
  Administered 2012-06-26: 1 mg via INTRAVENOUS
  Administered 2012-06-26: 2 mg via INTRAVENOUS

## 2012-06-26 MED ORDER — FENTANYL CITRATE 0.05 MG/ML IJ SOLN
INTRAMUSCULAR | Status: DC | PRN
  Administered 2012-06-26: 25 ug via INTRAVENOUS

## 2012-06-26 MED ORDER — LIDOCAINE HCL (CARDIAC) 20 MG/ML IV SOLN
INTRAVENOUS | Status: DC | PRN
  Administered 2012-06-26: 20 mg via INTRAVENOUS

## 2012-06-26 MED ORDER — FENTANYL CITRATE 0.05 MG/ML IJ SOLN
INTRAMUSCULAR | Status: AC
  Filled 2012-06-26: qty 2

## 2012-06-26 MED ORDER — SODIUM CHLORIDE 0.9 % IV SOLN
INTRAVENOUS | Status: DC | PRN
  Administered 2012-06-26: 16:00:00 via INTRAVENOUS

## 2012-06-26 MED ORDER — PROPOFOL 10 MG/ML IV BOLUS
INTRAVENOUS | Status: DC | PRN
  Administered 2012-06-26: 30 mg via INTRAVENOUS

## 2012-06-26 MED ORDER — MIDAZOLAM HCL 5 MG/ML IJ SOLN
INTRAMUSCULAR | Status: AC
  Filled 2012-06-26: qty 2

## 2012-06-26 MED ORDER — SODIUM CHLORIDE 0.9 % IV SOLN
INTRAVENOUS | Status: DC
  Administered 2012-06-26: 500 mL via INTRAVENOUS

## 2012-06-26 NOTE — CV Procedure (Signed)
See full TEE report in [employer]; no LAA thrombus identified; patient subsequently sedated by anesthesia with diprovan 30 mg IV. Synchronized DCCV with 120 joules resulted in atrial paced rhythm. No immediate complications; continue apixiban. Marcus [name]

## 2012-06-26 NOTE — Transfer of Care (Signed)
Immediate Anesthesia Transfer of Care Note  Patient: Marcus [name]  Procedure(s) Performed: Procedure(s) (LRB) with comments: TRANSESOPHAGEAL ECHOCARDIOGRAM (TEE) ([name]) CARDIOVERSION ([name])  Patient Location: Endoscopy Unit  Anesthesia Type:General  Level of Consciousness: sedated and patient cooperative  Airway & Oxygen Therapy: Patient Spontanous Breathing and Patient connected to face mask oxygen  Post-op Assessment: Report given to PACU RN and Post -op Vital signs reviewed and stable  Post vital signs: Reviewed and stable  Complications: No apparent anesthesia complications

## 2012-06-26 NOTE — H&P (View-Only) (Signed)
Marcus [name] Date of Birth: Oct 13, 1928 Medical Record [id number]  Problems: 1. Coronary artery disease- status post CABG and PCI 2. Pacemaker placement 3. History of prostate cancer 4. Ischemic cardiopathy with an ejection fraction of 35-40% 5. Atrial fibrillation  History of Present Illness: [name] is seen back today for a work in visit . He has an ischemic CM with an EF of 35 to 40% per recent echo, known CAD remote CABG/PCI - managed medically, HLD, prostate cancer, underlying pacemaker due to bradycardia.   He was most recently admitted with CHF and cough, fever. Treated with diuretics and antibiotics. When I saw him back I got his pacemaker checked. He was in atrial flutter and the dates correlated with his recent admission. This may have been the trigger.  I started Eliquis. He was quite unsure as to taking it.   He comes back today. He is here with his wife. Feels "crummy" per the wife. He is tired. Not really short of breath. Cough has almost resolved. No chest pain. No swelling. Weight has been fairly stable at home. No extra lasix. Trying to watch his salt. Not dizzy or lightheaded. He says he is taking his medicines.   Feb. 5, 2014: He has had more "bad" days than "good" days.  He has been having some worsening dyspnea recently.  He denies any PND or orthopnea.  He is short of breath at rest - worsens with any exertion.   These symptoms have been gradually and progressively worsening over the past week or so.  Current Outpatient Prescriptions on File Prior to Visit  Medication Sig Dispense Refill  . apixaban (ELIQUIS) 5 MG TABS tablet Take 1 tablet (5 mg total) by mouth 2 (two) times daily.  60 tablet  6  . Ascorbic Acid (VITAMIN C PO) Take 1 tablet by mouth daily.       Marland Kitchen aspirin 81 MG tablet Take 81 mg by mouth daily.      Marland Kitchen CALCIUM PO Take 1 tablet by mouth daily.       . carvedilol (COREG) 3.125 MG tablet Take 1 tablet (3.125 mg total) by mouth 2 (two) times  daily with a meal.  60 tablet  6  . Cholecalciferol (VITAMIN D PO) Take 1 capsule by mouth daily.       . Coenzyme Q10 (COQ10 PO) Take 1 tablet by mouth daily.       . fish oil-omega-3 fatty acids 1000 MG capsule Take 1 g by mouth daily.       . furosemide (LASIX) 40 MG tablet Take 1 tablet (40 mg total) by mouth daily.  30 tablet  6  . KRILL OIL PO Take by mouth daily.        Marland Kitchen lisinopril (PRINIVIL,ZESTRIL) 2.5 MG tablet Take 1 tablet (2.5 mg total) by mouth daily.  30 tablet  6  . LYCOPENE PO Take 1 tablet by mouth daily.       . niacin (NIASPAN) 500 MG CR tablet Take 2,000 mg by mouth at bedtime.        . potassium chloride SA (K-DUR,KLOR-CON) 20 MEQ tablet Take 1 tablet (20 mEq total) by mouth daily.  30 tablet  6  . Selenium 200 MCG CAPS Take by mouth daily.        Marland Kitchen VITAMIN E PO Take 1 capsule by mouth daily.         No Known Allergies  Past Medical History  Diagnosis Date  . Coronary  artery disease     a. s/p remote CABG x 2(VG->OM, LIMA->LAD;  b. Late 90's s/p PCI of RCA;  c. 12/09 Cath/PCI: LM 47m, 70-80d (3.0x33mm Xience DES), LAD100p, LCX 80-90p (2.25x54mmTaxus Atom DES), RCA 100d (2.5x35mm Xience DES), VG->OM 100, LIMA->LAD nl, EF 30-35%.  . Hyperlipidemia   . Prostate cancer   . PAF (paroxysmal atrial fibrillation)   . Ischemic cardiomyopathy     a. EF 35-40% by echo 05/2012  . Symptomatic bradycardia     a. 08/2003 s/p MDT Enpulse E2DR01 Dual chamber PPM ser # [id number] H.  . Systolic CHF     Past Surgical History  Procedure Date  . Coronary artery bypass graft   . Insert / replace / remove pacemaker   . Doppler echocardiography 06/24/2002    EF 60-65%    History  Smoking status  . Former Smoker  . Quit date: 02/10/1971  Smokeless tobacco  . Not on file    History  Alcohol Use No    Family History  Problem Relation Age of Onset  . Hypertension Mother   . Cancer Mother   . Alcohol abuse Father     Review of Systems: The review of systems is per the  HPI.  All other systems were reviewed and are negative.  Physical Exam: BP 84/50  Pulse 72  Temp 97 F (36.1 C)  Ht 5\' 10"  (1.778 m)  Wt 147 lb (66.679 kg)  BMI 21.09 kg/m2  SpO2 93% Patient is alert, looks chronically ill but in no acute distress. Skin is warm and dry. Color is normal.  HEENT is unremarkable. Normocephalic/atraumatic. PERRL. Sclera are nonicteric. Neck is supple. No masses. No JVD. Lungs are clear. Cardiac exam shows an irregular rhythm. Rate is controlled. Abdomen is soft. Extremities are without edema. Gait and ROM are intact. No gross neurologic deficits noted.   LABORATORY DATA: Lab Results  Component Value Date   WBC 4.0* 06/20/2012   HGB 9.5* 06/20/2012   HCT 28.5* 06/20/2012   PLT 114.0* 06/20/2012   GLUCOSE 105* 06/11/2012   CHOL 175 02/28/2011   TRIG 37.0 02/28/2011   HDL 77.40 02/28/2011   LDLCALC 90 02/28/2011   ALT 16 05/25/2012   AST 20 05/25/2012   NA 135 06/11/2012   K 3.9 06/11/2012   CL 99 06/11/2012   CREATININE 1.2 06/11/2012   BUN 27* 06/11/2012   CO2 30 06/11/2012   TSH 0.736 05/24/2012   INR 1.14 05/24/2012   EKG: 06/25/2012: Atrial fibrillation. He has ventricular rate of 66 with intermittent ventricular pacing.  Assessment / Plan:

## 2012-06-26 NOTE — Anesthesia Preprocedure Evaluation (Addendum)
Anesthesia Evaluation  Patient identified by MRN, date of birth, ID band Patient awake    Reviewed: Allergy & Precautions, H&P , NPO status , Patient's Chart, lab work & pertinent test results, reviewed documented beta blocker date and time   History of Anesthesia Complications Negative for: history of anesthetic complications  Airway Mallampati: II TM Distance: >3 FB Neck ROM: Full    Dental  (+) Teeth Intact and Dental Advisory Given   Pulmonary shortness of breath (due to afib) and with exertion,    + decreased breath sounds      Cardiovascular hypertension, Pt. on medications and Pt. on home beta blockers + CAD, + CABG (1990; stents placed about 10 years ago) and +CHF (admission 05/24/12 for CHF) + dysrhythmias Atrial Fibrillation + pacemaker ([employer]; placed in 2004)     Neuro/Psych negative neurological ROS     GI/Hepatic negative GI ROS, Neg liver ROS,   Endo/Other  negative endocrine ROS  Renal/GU negative Renal ROS     Musculoskeletal   Abdominal   Peds  Hematology negative hematology ROS (+)   Anesthesia Other Findings   Reproductive/Obstetrics negative OB ROS                          Anesthesia Physical Anesthesia Plan  ASA: III  Anesthesia Plan: General   Post-op Pain Management:    Induction: Intravenous  Airway Management Planned: Mask  Additional Equipment:   Intra-op Plan:   Post-operative Plan:   Informed Consent: I have reviewed the patients History and Physical, chart, labs and discussed the procedure including the risks, benefits and alternatives for the proposed anesthesia with the patient or authorized representative who has indicated his/her understanding and acceptance.     Plan Discussed with: CRNA, Anesthesiologist and Surgeon  Anesthesia Plan Comments:         Anesthesia Quick Evaluation

## 2012-06-26 NOTE — Addendum Note (Signed)
Addendum  created 06/26/12 1610 by Jenne Campus, CRNA   Modules edited:Anesthesia Flowsheet, Anesthesia Responsible Staff

## 2012-06-26 NOTE — Progress Notes (Signed)
  Echocardiogram Echocardiogram Transesophageal has been performed.  Marcus [name] A 06/26/2012, 3:58 PM

## 2012-06-26 NOTE — Preoperative (Signed)
Beta Blockers   Reason not to administer Beta Blockers:Not Applicable 

## 2012-06-26 NOTE — Anesthesia Postprocedure Evaluation (Signed)
  Anesthesia Post-op Note  Patient: Marcus [name]  Procedure(s) Performed: Procedure(s) (LRB) with comments: TRANSESOPHAGEAL ECHOCARDIOGRAM (TEE) ([name]) CARDIOVERSION ([name])  Patient Location: PACU and Endoscopy Unit  Anesthesia Type:General  Level of Consciousness: awake  Airway and Oxygen Therapy: Patient Spontanous Breathing  Post-op Pain: none  Post-op Assessment: Post-op Vital signs reviewed, Patient's Cardiovascular Status Stable, Respiratory Function Stable, Patent Airway, No signs of Nausea or vomiting and Pain level controlled  Post-op Vital Signs: stable  Complications: No apparent anesthesia complications

## 2012-06-26 NOTE — Interval H&P Note (Signed)
History and Physical Interval Note:  06/26/2012 3:14 PM  Marcus [name]  has presented today for surgery, with the diagnosis of a-fib  The various methods of treatment have been discussed with the patient and family. After consideration of risks, benefits and other options for treatment, the patient has consented to  Procedure(s) (LRB) with comments: TRANSESOPHAGEAL ECHOCARDIOGRAM (TEE) ([name]) CARDIOVERSION ([name]) as a surgical intervention .  The patient's history has been reviewed, patient examined, no change in status, stable for surgery.  I have reviewed the patient's chart and labs.  Questions were answered to the patient's satisfaction.     Kirk Ruths

## 2012-06-27 ENCOUNTER — Encounter (HOSPITAL_COMMUNITY): Payer: Self-pay | Admitting: Cardiology

## 2012-06-27 ENCOUNTER — Telehealth: Payer: Self-pay | Admitting: *Deleted

## 2012-06-27 NOTE — Telephone Encounter (Signed)
New problem   Wife calling     Was taken off medication while at the hospital . Please advise  When should he resume  Lisinopril

## 2012-06-27 NOTE — Telephone Encounter (Signed)
Pt's wife called to ask if pt should resume Lisinopril.  Advised pt to stay off over the weekend and keep a good check of his BP.  Pt wants Jodette to call her first of the week.

## 2012-06-27 NOTE — Telephone Encounter (Signed)
Left a message to call back.

## 2012-06-28 ENCOUNTER — Inpatient Hospital Stay (HOSPITAL_COMMUNITY)
Admission: EM | Admit: 2012-06-28 | Discharge: 2012-06-29 | DRG: 292 | Disposition: A | Discharge status: Home / Self Care | Payer: Medicare Other | Attending: Internal Medicine | Admitting: Internal Medicine

## 2012-06-28 ENCOUNTER — Emergency Department (HOSPITAL_COMMUNITY): Payer: Medicare Other

## 2012-06-28 ENCOUNTER — Encounter (HOSPITAL_COMMUNITY): Payer: Self-pay | Admitting: *Deleted

## 2012-06-28 ENCOUNTER — Other Ambulatory Visit: Payer: Self-pay

## 2012-06-28 DIAGNOSIS — Z951 Presence of aortocoronary bypass graft: Secondary | ICD-10-CM

## 2012-06-28 DIAGNOSIS — I255 Ischemic cardiomyopathy: Secondary | ICD-10-CM

## 2012-06-28 DIAGNOSIS — I5023 Acute on chronic systolic (congestive) heart failure: Principal | ICD-10-CM | POA: Diagnosis present

## 2012-06-28 DIAGNOSIS — I509 Heart failure, unspecified: Secondary | ICD-10-CM | POA: Diagnosis not present

## 2012-06-28 DIAGNOSIS — E785 Hyperlipidemia, unspecified: Secondary | ICD-10-CM | POA: Diagnosis not present

## 2012-06-28 DIAGNOSIS — I495 Sick sinus syndrome: Secondary | ICD-10-CM | POA: Diagnosis present

## 2012-06-28 DIAGNOSIS — D649 Anemia, unspecified: Secondary | ICD-10-CM | POA: Diagnosis present

## 2012-06-28 DIAGNOSIS — Z950 Presence of cardiac pacemaker: Secondary | ICD-10-CM | POA: Diagnosis present

## 2012-06-28 DIAGNOSIS — I4892 Unspecified atrial flutter: Secondary | ICD-10-CM | POA: Diagnosis present

## 2012-06-28 DIAGNOSIS — I10 Essential (primary) hypertension: Secondary | ICD-10-CM | POA: Diagnosis not present

## 2012-06-28 DIAGNOSIS — I2589 Other forms of chronic ischemic heart disease: Secondary | ICD-10-CM | POA: Diagnosis not present

## 2012-06-28 DIAGNOSIS — Z8546 Personal history of malignant neoplasm of prostate: Secondary | ICD-10-CM

## 2012-06-28 DIAGNOSIS — Z8701 Personal history of pneumonia (recurrent): Secondary | ICD-10-CM

## 2012-06-28 DIAGNOSIS — I4891 Unspecified atrial fibrillation: Secondary | ICD-10-CM | POA: Diagnosis present

## 2012-06-28 DIAGNOSIS — J90 Pleural effusion, not elsewhere classified: Secondary | ICD-10-CM | POA: Diagnosis not present

## 2012-06-28 DIAGNOSIS — I2510 Atherosclerotic heart disease of native coronary artery without angina pectoris: Secondary | ICD-10-CM | POA: Diagnosis present

## 2012-06-28 LAB — CBC WITH DIFFERENTIAL/PLATELET
HCT: 29.3 % — ABNORMAL LOW (ref 39.0–52.0)
Lymphocytes Relative: 16 % (ref 12–46)
Lymphs Abs: 0.6 10*3/uL — ABNORMAL LOW (ref 0.7–4.0)
Neutrophils Relative %: 68 % (ref 43–77)
Platelets: 132 10*3/uL — ABNORMAL LOW (ref 150–400)

## 2012-06-28 LAB — BASIC METABOLIC PANEL
CO2: 26 mEq/L (ref 19–32)
Chloride: 100 mEq/L (ref 96–112)
Glucose, Bld: 121 mg/dL — ABNORMAL HIGH (ref 70–99)
Potassium: 4.6 mEq/L (ref 3.5–5.1)
Sodium: 135 mEq/L (ref 135–145)

## 2012-06-28 LAB — TROPONIN I: Troponin I: <0.3 ng/mL (ref <0.30)

## 2012-06-28 LAB — POCT I-STAT TROPONIN I: Troponin i, poc: 0.03 ng/mL (ref 0.00–0.08)

## 2012-06-28 LAB — APTT: aPTT: 45 seconds — ABNORMAL HIGH (ref 24–37)

## 2012-06-28 LAB — PROTIME-INR
INR: 1.47 (ref 0.00–1.49)
Prothrombin Time: 17.4 seconds — ABNORMAL HIGH (ref 11.6–15.2)

## 2012-06-28 MED ORDER — APIXABAN 5 MG PO TABS
5.0000 mg | ORAL_TABLET | Freq: Two times a day (BID) | ORAL | Status: DC
  Administered 2012-06-28 – 2012-06-29 (×2): 5 mg via ORAL
  Filled 2012-06-28 (×3): qty 1

## 2012-06-28 MED ORDER — SODIUM CHLORIDE 0.9 % IJ SOLN: 3.0000 mL | INTRAMUSCULAR | Status: DC | PRN

## 2012-06-28 MED ORDER — ASPIRIN 81 MG PO TABS: 81.0000 mg | ORAL_TABLET | Freq: Every day | ORAL | Status: DC

## 2012-06-28 MED ORDER — FUROSEMIDE 10 MG/ML IJ SOLN
40.0000 mg | Freq: Once | INTRAMUSCULAR | Status: AC
  Administered 2012-06-28: 40 mg via INTRAVENOUS
  Filled 2012-06-28: qty 4

## 2012-06-28 MED ORDER — ACETAMINOPHEN 325 MG PO TABS: 650.0000 mg | ORAL_TABLET | ORAL | Status: DC | PRN

## 2012-06-28 MED ORDER — NIACIN ER (ANTIHYPERLIPIDEMIC) 500 MG PO TBCR
2000.0000 mg | EXTENDED_RELEASE_TABLET | Freq: Every day | ORAL | Status: DC
  Administered 2012-06-28: 2000 mg via ORAL
  Filled 2012-06-28 (×2): qty 4

## 2012-06-28 MED ORDER — SODIUM CHLORIDE 0.9 % IJ SOLN
3.0000 mL | Freq: Two times a day (BID) | INTRAMUSCULAR | Status: DC
  Administered 2012-06-28 – 2012-06-29 (×2): 3 mL via INTRAVENOUS

## 2012-06-28 MED ORDER — OMEGA-3 FATTY ACIDS 1000 MG PO CAPS
1.0000 g | ORAL_CAPSULE | Freq: Every day | ORAL | Status: DC
Start: 2012-06-28 — End: 2012-06-28

## 2012-06-28 MED ORDER — TORSEMIDE 20 MG PO TABS
20.0000 mg | ORAL_TABLET | Freq: Every day | ORAL | Status: DC
  Administered 2012-06-29: 20 mg via ORAL
  Filled 2012-06-28 (×2): qty 1

## 2012-06-28 MED ORDER — SODIUM CHLORIDE 0.9 % IV SOLN: 250.0000 mL | INTRAVENOUS | Status: DC | PRN

## 2012-06-28 MED ORDER — POTASSIUM CHLORIDE CRYS ER 20 MEQ PO TBCR
20.0000 meq | EXTENDED_RELEASE_TABLET | Freq: Two times a day (BID) | ORAL | Status: DC
  Administered 2012-06-28 – 2012-06-29 (×2): 20 meq via ORAL
  Filled 2012-06-28 (×3): qty 1

## 2012-06-28 MED ORDER — ASPIRIN EC 81 MG PO TBEC
81.0000 mg | DELAYED_RELEASE_TABLET | Freq: Every day | ORAL | Status: DC
  Administered 2012-06-28 – 2012-06-29 (×2): 81 mg via ORAL
  Filled 2012-06-28 (×2): qty 1

## 2012-06-28 MED ORDER — ASPIRIN 81 MG PO CHEW
162.0000 mg | CHEWABLE_TABLET | Freq: Once | ORAL | Status: AC
  Administered 2012-06-28: 162 mg via ORAL
  Filled 2012-06-28: qty 2

## 2012-06-28 MED ORDER — CARVEDILOL 3.125 MG PO TABS
3.1250 mg | ORAL_TABLET | Freq: Two times a day (BID) | ORAL | Status: DC
  Administered 2012-06-29: 3.125 mg via ORAL
  Filled 2012-06-28 (×3): qty 1

## 2012-06-28 MED ORDER — POTASSIUM CHLORIDE CRYS ER 20 MEQ PO TBCR
40.0000 meq | EXTENDED_RELEASE_TABLET | Freq: Once | ORAL | Status: AC
  Administered 2012-06-28: 40 meq via ORAL
  Filled 2012-06-28: qty 1

## 2012-06-28 MED ORDER — ONDANSETRON HCL 4 MG/2ML IJ SOLN: 4.0000 mg | Freq: Four times a day (QID) | INTRAMUSCULAR | Status: DC | PRN

## 2012-06-28 MED ORDER — OMEGA-3-ACID ETHYL ESTERS 1 G PO CAPS
1.0000 g | ORAL_CAPSULE | Freq: Every day | ORAL | Status: DC
  Administered 2012-06-28 – 2012-06-29 (×2): 1 g via ORAL
  Filled 2012-06-28 (×2): qty 1

## 2012-06-28 MED ORDER — ALPRAZOLAM 0.25 MG PO TABS
0.2500 mg | ORAL_TABLET | Freq: Two times a day (BID) | ORAL | Status: DC | PRN
  Administered 2012-06-29: 0.25 mg via ORAL
  Filled 2012-06-28: qty 1

## 2012-06-28 NOTE — ED Provider Notes (Signed)
History     CSN: [id number]  Arrival date & time 06/28/12  [id number]   First MD Initiated Contact with Patient 06/28/12 1009      Chief Complaint  Patient presents with  . Shortness of Breath    (Consider location/radiation/quality/duration/timing/severity/associated sxs/prior treatment) HPI Comments: Mr. Cupo ischemic CM with an EF of 35 to 40% per recent echo, known CAD remote CABG/PCI - managed medically, HLD, prostate cancer, underlying pacemaker due to bradycardia and afib, s/p cardioversion on Thurrsday. Pt is coming in with cc of dib. Pt states that he started getting sob last night around midnight, but that having some dib is not uncommon for him, so her didn't think much of it. He was unable to sleep all night, woke up around 4 am, and around 9 am his dib got acutely worse. The DIB was not precipitated by exertion, it was more severe than usual, and lasted long enough for him to call EMS, En route to the ED he started feeling better - and although in no distress right now, he still feels that he has some dib and that he is not breathing normally. Pt has no associated chest pain, palpitations, nausea, diaphoresis. Pt has no new med changes, and has been compliant. Pt has probably gained 4 lbs over the past few days.      Patient is a 79 y.o. male presenting with shortness of breath. The history is provided by the patient and medical records.  Shortness of Breath Associated symptoms: no chest pain, no cough, no fever, no headaches, no neck pain and no wheezing     Past Medical History  Diagnosis Date  . Coronary artery disease     a. s/p remote CABG x 2(VG->OM, LIMA->LAD;  b. Late 90's s/p PCI of RCA;  c. 12/09 Cath/PCI: LM 27m, 70-80d (3.0x19mm Xience DES), LAD100p, LCX 80-90p (2.25x8mmTaxus Atom DES), RCA 100d (2.5x67mm Xience DES), VG->OM 100, LIMA->LAD nl, EF 30-35%.  . Hyperlipidemia   . Prostate cancer   . PAF (paroxysmal atrial fibrillation)   . Ischemic  cardiomyopathy     a. EF 35-40% by echo 05/2012  . Symptomatic bradycardia     a. 08/2003 s/p MDT Enpulse [id number] Dual chamber PPM ser # [id number] H.  . Systolic CHF     Past Surgical History  Procedure Laterality Date  . Coronary artery bypass graft    . Insert / replace / remove pacemaker    . Doppler echocardiography  06/24/2002    EF 60-65%  . Tee without cardioversion  06/26/2012    Procedure: TRANSESOPHAGEAL ECHOCARDIOGRAM (TEE);  Surgeon: Lelon Perla, MD;  Location: [facility] ENDOSCOPY;  Service: Cardiovascular;  Laterality: [name];  . Cardioversion  06/26/2012    Procedure: CARDIOVERSION;  Surgeon: Lelon Perla, MD;  Location: [facility] ENDOSCOPY;  Service: Cardiovascular;  Laterality: [name];    Family History  Problem Relation Age of Onset  . Hypertension Mother   . Cancer Mother   . Alcohol abuse Father     History  Substance Use Topics  . Smoking status: Former Smoker    Quit date: 02/10/1971  . Smokeless tobacco: Not on file  . Alcohol Use: No      Review of Systems  Constitutional: Positive for fatigue. Negative for fever, chills and activity change.  HENT: Negative for neck pain.   Eyes: Negative for visual disturbance.  Respiratory: Positive for shortness of breath. Negative for cough, chest tightness, wheezing and stridor.   Cardiovascular: Positive for leg swelling. Negative  for chest pain.  Gastrointestinal: Negative for abdominal distention.  Genitourinary: Negative for dysuria, enuresis and difficulty urinating.  Musculoskeletal: Negative for arthralgias.  Neurological: Negative for dizziness, light-headedness and headaches.  Psychiatric/Behavioral: Negative for confusion.    Allergies  Review of patient's allergies indicates no known allergies.  Home Medications   Current Outpatient Rx  Name  Route  Sig  Dispense  Refill  . apixaban (ELIQUIS) 5 MG TABS tablet   Oral   Take 5 mg by mouth 2 (two) times daily.         . Ascorbic Acid (VITAMIN C PO)    Oral   Take 1 tablet by mouth daily.          Marland Kitchen aspirin 81 MG tablet   Oral   Take 81 mg by mouth daily.         Marland Kitchen CALCIUM PO   Oral   Take 1 tablet by mouth daily.          . carvedilol (COREG) 3.125 MG tablet   Oral   Take 3.125 mg by mouth 2 (two) times daily with a meal.         . Cholecalciferol (VITAMIN D PO)   Oral   Take 1 capsule by mouth daily.          . Coenzyme Q10 (COQ10 PO)   Oral   Take 1 tablet by mouth daily.          . fish oil-omega-3 fatty acids 1000 MG capsule   Oral   Take 1 g by mouth daily.          . furosemide (LASIX) 40 MG tablet   Oral   Take 1 tablet (40 mg total) by mouth daily.   30 tablet   6   . KRILL OIL PO   Oral   Take by mouth daily.           Marland Kitchen LYCOPENE PO   Oral   Take 1 tablet by mouth daily.          . niacin (NIASPAN) 500 MG CR tablet   Oral   Take 2,000 mg by mouth at bedtime.           . potassium chloride SA (K-DUR,KLOR-CON) 20 MEQ tablet   Oral   Take 20 mEq by mouth 2 (two) times daily.         . Selenium 200 MCG CAPS   Oral   Take by mouth daily.           Marland Kitchen VITAMIN E PO   Oral   Take 1 capsule by mouth daily.            BP 161/76  Pulse 86  Temp(Src) 97.4 F (36.3 C) (Oral)  Resp 20  SpO2 100%  Physical Exam  Nursing note and vitals reviewed. Constitutional: He is oriented to person, place, and time. He appears well-developed.  HENT:  Head: Normocephalic and atraumatic.  Eyes: Conjunctivae and EOM are normal. Pupils are equal, round, and reactive to light.  Neck: Normal range of motion. Neck supple. JVD present.  Cardiovascular: Normal rate.   Murmur heard. Pulmonary/Chest: Effort normal. No respiratory distress. He has no wheezes. He has rales.  Abdominal: Soft. Bowel sounds are normal. He exhibits no distension. There is no tenderness. There is no rebound and no guarding.  Neurological: He is alert and oriented to person, place, and time.  Skin: Skin is warm.     ED  Course  Procedures (including critical care time)  Labs Reviewed  CBC WITH DIFFERENTIAL  BASIC METABOLIC PANEL  PRO B NATRIURETIC PEPTIDE  TROPONIN I  PROTIME-INR  APTT   No results found.   No diagnosis found.    MDM   Date: 06/28/2012  Rate: 65  Rhythm: atrial paced, ventricle paced  QRS Axis: normal  Intervals: normal  ST/T Wave abnormalities: nonspecific ST/T changes  Conduction Disutrbances:none  Narrative Interpretation:   Old EKG Reviewed: changes noted - more prominent t wave inversions in the lateral leads, inferior leads.  Differential diagnosis includes: ACS syndrome CHF exacerbation Valvular disorder Pericardial effusion Pneumonia Pleural effusion Pulmonary edema PE Anemia  Pt comes in with cc of dib. He does have some weight gain, and exam is consistent with rales and pitting edema. He is noted to be slightly tachypenic during my exam as well - however in no distress. Pt was in resp distress earlier - and was unable to complete full sentences. Flash pulm edema? Tachydysrhythmia?  We will get basic cards workup started, and also check for volume status.     Varney Biles, MD 06/28/12 1055

## 2012-06-28 NOTE — ED Notes (Signed)
To ED for eval of SOB. Pt states he was cardioverted here yesterday. Speaks in winded sentences. Denies cp.

## 2012-06-28 NOTE — ED Notes (Signed)
Pt. sts minimal SOB at this time, pt has 2+ pitting edema in bilateral legs. No JVD present

## 2012-06-28 NOTE — H&P (Signed)
History and Physical   Patient ID: Marcus [name] MRN: [id number], DOB/AGE: 1929/03/15   Admit date: 06/28/2012 Date of Consult: 06/28/2012   Primary Physician: Default, Provider, MD Primary Cardiologist:  Liam Rogers, MD  HPI: Marcus [name] is a 79 y.o. male w/ PMHx s/f CAD s/p CABG & PCI, ischemic cardiomyopathy (EF 35-40%), symptomatic bradycardia s/p Medtronic PPM, PAF (on Eliquis), HLD and h/o prostate CA who presents to [facility] ED today with worsening shortness of breath. He recently followed up with Dr. Acie Fredrickson on 06/25/12 after being hospitalized for PNA and CHF treated with antibiotics and diuretics. His pacemaker was checked on follow-up, and he was found to be in atrial flutter post-discharge. He was started on Eliquis. On subsequent follow-up he complained of generalized fatigue, worsening shortness of breath and persistent leg edema. An echo performed last month revealed reduced EF. Lab work on follow-up did indicate a stable normocytic anemia compared to prior results, otherwise no abnormalities on CMET. Recent FOBTs negative x 5. Anemia with loss of atrial kick was thought to be contributing to his decompensation. The plan was made to proceed with TEE/DCCV with the thought that restoring atrial kick would improve his LV function and CHF symptoms. He was successfully cardioverted to an atrial paced rhythm on 06/26/12 at [facility]. Additionally, BP was noted to be labile and ACEi was held.    Last cath 04/2008: 50% mid left main, 70-80% distal left main, 8--90% prox LCx, RCA occluded distally, L-R collaterals, SVG-OM occlued, IMA-LAD patent; PCI to RCA, OM and left main; LVEF 30-35%  TEE 06/26/12: LVEF 40-45%, inferoposterior and lateral AK, moderate MR, mod-severe LA dilatation; no evidence of thrombus or veg  He returns today after experiencing acutely worsening dyspnea this AM, LE edema and weight gain. Baseline weight 142 lbs. 147 lbs today. He reports PND late last night which  progressively worsened throughout the night. He usually able to lay flat, but endorsed significant orthopnea. EMS was called. He does note a chest "fullness" at times, not related to exertion. No active bleeding. No fevers, chills, cough, urinary or bowel changes. No increased salt intake. Compliant with meds. TSH normal last month.   In the ED, EKG reveals a-pacing. TnI WNL. pBNP [id number]. CXR with probable mild CHF with cardiomegaly and pulmonary vascular congestion; increase in R possibly loculated pleural effusion. He is mildly neutropenic, H/H low, but stable at 9.9/29.3, MCV WNL. VSS today.   Problem List: Past Medical History  Diagnosis Date  . Coronary artery disease     a. s/p remote CABG x 2(VG->OM, LIMA->LAD;  b. Late 90's s/p PCI of RCA;  c. 12/09 Cath/PCI: LM 43m, 70-80d (3.0x34mm Xience DES), LAD100p, LCX 80-90p (2.25x49mmTaxus Atom DES), RCA 100d (2.5x39mm Xience DES), VG->OM 100, LIMA->LAD nl, EF 30-35%.  . Hyperlipidemia   . Prostate cancer   . PAF (paroxysmal atrial fibrillation)   . Ischemic cardiomyopathy     a. EF 35-40% by echo 05/2012  . Symptomatic bradycardia     a. 08/2003 s/p MDT Enpulse [id number] Dual chamber PPM ser # [id number] H.  . Systolic CHF     Past Surgical History  Procedure Laterality Date  . Coronary artery bypass graft    . Insert / replace / remove pacemaker    . Doppler echocardiography  06/24/2002    EF 60-65%  . Tee without cardioversion  06/26/2012    Procedure: TRANSESOPHAGEAL ECHOCARDIOGRAM (TEE);  Surgeon: Lelon Perla, MD;  Location: [facility];  Service: Cardiovascular;  Laterality: [name];  . Cardioversion  06/26/2012    Procedure: CARDIOVERSION;  Surgeon: Lelon Perla, MD;  Location: [facility] ENDOSCOPY;  Service: Cardiovascular;  Laterality: [name];     Allergies: No Known Allergies  Home Medications: Prior to Admission medications   Medication Sig Start Date End Date Taking? Authorizing Provider  apixaban (ELIQUIS) 5 MG TABS tablet Take 5 mg by  mouth 2 (two) times daily.   Yes Historical Provider, MD  Ascorbic Acid (VITAMIN C PO) Take 1 tablet by mouth daily.    Yes Historical Provider, MD  aspirin 81 MG tablet Take 81 mg by mouth daily.   Yes Historical Provider, MD  CALCIUM PO Take 1 tablet by mouth daily.    Yes Historical Provider, MD  carvedilol (COREG) 3.125 MG tablet Take 3.125 mg by mouth 2 (two) times daily with a meal.   Yes Historical Provider, MD  Cholecalciferol (VITAMIN D PO) Take 1 capsule by mouth daily.    Yes Historical Provider, MD  Coenzyme Q10 (COQ10 PO) Take 1 tablet by mouth daily.    Yes Historical Provider, MD  fish oil-omega-3 fatty acids 1000 MG capsule Take 1 g by mouth daily.    Yes Historical Provider, MD  furosemide (LASIX) 40 MG tablet Take 1 tablet (40 mg total) by mouth daily. 05/29/12  Yes Jessica A Hope, PA-C  KRILL OIL PO Take by mouth daily.     Yes Historical Provider, MD  LYCOPENE PO Take 1 tablet by mouth daily.    Yes Historical Provider, MD  niacin (NIASPAN) 500 MG CR tablet Take 2,000 mg by mouth at bedtime.     Yes Historical Provider, MD  potassium chloride SA (K-DUR,KLOR-CON) 20 MEQ tablet Take 20 mEq by mouth 2 (two) times daily.   Yes Historical Provider, MD  Selenium 200 MCG CAPS Take by mouth daily.     Yes Historical Provider, MD  VITAMIN E PO Take 1 capsule by mouth daily.    Yes Historical Provider, MD    Inpatient Medications:    (Not in a hospital admission)  Family History  Problem Relation Age of Onset  . Hypertension Mother   . Cancer Mother   . Alcohol abuse Father      History   Social History  . Marital Status: Married    Spouse Name: [name]    Number of Children: [name]  . Years of Education: [name]   Occupational History  . Not on file.   Social History Main Topics  . Smoking status: Former Smoker    Quit date: 02/10/1971  . Smokeless tobacco: Not on file  . Alcohol Use: No  . Drug Use: No  . Sexually Active: No   Other Topics Concern  . Not on file    Social History Narrative   Lives in [place] with wife.  Retired.     Review of Systems: General: negative for chills, fever, night sweats or weight changes.  Cardiovascular: positive for chest discomfort, edema, orthopnea, paroxysmal nocturnal dyspnea or shortness of breath, negative for palpitations Dermatological: egative for rash Respiratory: negative for cough or wheezing Urologic: negative for hematuria Abdominal: negative for nausea, vomiting, diarrhea, bright red blood per rectum, melena, or hematemesis Neurologic:  negative for visual changes, syncope, or dizziness All other systems reviewed and are otherwise negative except as noted above.  Physical Exam: Blood pressure 145/87, pulse 65, temperature 97.4 F (36.3 C), temperature source Oral, resp. rate 16, SpO2 100.00%.  General: Thin, well developed, in no  acute distress. Head: Normocephalic, atraumatic, sclera non-icteric, no xanthomas, nares are without discharge Neck: Negative for carotid bruits. JVP 9-10 cm.  Lungs: Bibasilar rales and reduced RLL lung sounds. No appreciable rhonchi or wheezing. Breathing is unlabored upright, but labored supine.  Heart: RRR with S1 S2. II/VI systolic murmur at the apex. No rubs, or gallops appreciated. Abdomen: Soft, non-tender, non-distended with normoactive bowel sounds. No hepatomegaly. No rebound/guarding. No obvious abdominal masses. Msk: Strength and tone appears normal for age. Extremities: No clubbing, cyanosis or edema.  Distal pedal pulses are 2+ and equal bilaterally. Neuro: Alert and oriented X 3. Moves all extremities spontaneously. Psych:  Responds to questions appropriately with a normal affect.  Labs: Recent Labs     06/28/12  1044  WBC  3.7*  HGB  9.9*  HCT  29.3*  MCV  93.0  PLT  132*    Recent Labs Lab 06/25/12 1215 06/28/12 1044  NA 140 135  K 3.7 4.6  CL 104 100  CO2 30 26  BUN 30* 25*  CREATININE 1.2 1.15  CALCIUM 9.0 9.4  PROT 6.4  --   BILITOT  1.0  --   ALKPHOS 40  --   ALT 16  --   AST 22  --   GLUCOSE 96 121*   Recent Labs     06/28/12  1044  TROPONINI  <0.30   Radiology/Studies: Dg Chest Port 1 View  06/28/2012  *RADIOLOGY REPORT*  Clinical Data: Shortness of breath  PORTABLE CHEST - 1 VIEW  Comparison: Chest x-ray of 05/26/2012  Findings: The lungs are not well aerated and there does appear to be pulmonary vascular congestion present.  Also there appears to be a right effusion some of which may be loculated over the right lung apex and lateral right upper hemithorax.  Cardiomegaly is stable. A permanent pacemaker is noted.  Median sternotomy sutures remain.  IMPRESSION:  1.  Probable mild congestive heart failure with cardiomegaly and pulmonary vascular congestion. 2.  Increase in right pleural effusion some of which appears to be loculated overlying the right lung apex.   Original Report Authenticated By: Ivar Drape, M.D.     EKG: A-paced, 65 bpm, more pronounced ST depressions II, III, aVF, V4  ASSESSMENT:   1. Acute on chronic systolic CHF 2. Ischemic cardiomyopathy 3. CAD s/p CABG & PCI 4. PAF 5. Symptomatic bradycardia s/p Medtronic dual-chamber PPM 6. Hyperlipidemia 7. H/o prostate CA  DISCUSSION/PLAN:  Patient presents to [facility] ED after experiencing progressive weight gain, worsening LE edema, PND with orthopnea last night and acutely worsening dyspnea. pBNP and CXR support acute CHF decompensation. He recently underwent TEE/DCCV with restoration to an atrial-paced rhythm. He denies intercurrent palpitations. He has a device check this week, may need to interrogate to make sure not developing paroxysms of a-fib/flutter. Check u/a to rule out infection with neutropenia. Patient has not undergone an ischemic evaluation for some time now. He does note periodic chest "fullness" when he accumulates fluid. EKG does reveal worsening inferolateal TWIs. Prior caths and ischemic history demonstrates diffuse multivessel CAD. BP is  well-controlled, and per the patient and daughter's reports, has been well-controlled at home off of Lisinopril (held due to labile BP last week). Can resume this. Will discuss with MD the indications for considering CRT-D (QRS <120 ms)- may need to consider after ischemic evaluation. Order anemia panel.    Signed, R. Valeria Batman, PA-C 06/28/2012, 1:04 PM   Patient seen and examined with Valeria Batman, PA-C.  We discussed all aspects of the encounter. I agree with the assessment and plan as stated above.   Volume status improving with IV lasix but still symptomatic. Will give another 40 mg lasix and see how he responds. If markedly improved may be able to go home but suspect he will need 24-hour observation for diuresis.   Would switch him to demadex 20 daily on discharge. Appears to have narrow euvolemic window between being wet versus dry and hypotensive.   CRT upgrade interesting thought. Will need to interrogate pacer. If high burden of RV pacing might be worth asking EP to consider.   Daniel Bensimhon,MD 2:27 PM

## 2012-06-29 DIAGNOSIS — D649 Anemia, unspecified: Secondary | ICD-10-CM | POA: Diagnosis present

## 2012-06-29 DIAGNOSIS — I4891 Unspecified atrial fibrillation: Secondary | ICD-10-CM | POA: Diagnosis present

## 2012-06-29 DIAGNOSIS — I5023 Acute on chronic systolic (congestive) heart failure: Secondary | ICD-10-CM | POA: Diagnosis present

## 2012-06-29 DIAGNOSIS — I10 Essential (primary) hypertension: Secondary | ICD-10-CM | POA: Diagnosis present

## 2012-06-29 DIAGNOSIS — I509 Heart failure, unspecified: Secondary | ICD-10-CM | POA: Diagnosis present

## 2012-06-29 DIAGNOSIS — Z8546 Personal history of malignant neoplasm of prostate: Secondary | ICD-10-CM | POA: Diagnosis not present

## 2012-06-29 DIAGNOSIS — Z950 Presence of cardiac pacemaker: Secondary | ICD-10-CM | POA: Diagnosis not present

## 2012-06-29 DIAGNOSIS — I2510 Atherosclerotic heart disease of native coronary artery without angina pectoris: Secondary | ICD-10-CM | POA: Diagnosis present

## 2012-06-29 DIAGNOSIS — I2589 Other forms of chronic ischemic heart disease: Secondary | ICD-10-CM | POA: Diagnosis present

## 2012-06-29 DIAGNOSIS — I495 Sick sinus syndrome: Secondary | ICD-10-CM | POA: Diagnosis present

## 2012-06-29 DIAGNOSIS — Z951 Presence of aortocoronary bypass graft: Secondary | ICD-10-CM | POA: Diagnosis not present

## 2012-06-29 DIAGNOSIS — R0602 Shortness of breath: Secondary | ICD-10-CM | POA: Diagnosis not present

## 2012-06-29 DIAGNOSIS — E785 Hyperlipidemia, unspecified: Secondary | ICD-10-CM | POA: Diagnosis present

## 2012-06-29 DIAGNOSIS — I4892 Unspecified atrial flutter: Secondary | ICD-10-CM | POA: Diagnosis present

## 2012-06-29 DIAGNOSIS — Z8701 Personal history of pneumonia (recurrent): Secondary | ICD-10-CM | POA: Diagnosis not present

## 2012-06-29 LAB — BASIC METABOLIC PANEL
CO2: 27 mEq/L (ref 19–32)
Chloride: 103 mEq/L (ref 96–112)
Creatinine, Ser: 1.03 mg/dL (ref 0.50–1.35)
GFR calc Af Amer: 77 mL/min — ABNORMAL LOW (ref >90)
Potassium: 4 mEq/L (ref 3.5–5.1)

## 2012-06-29 LAB — RETICULOCYTES
RBC.: 3.46 MIL/uL — ABNORMAL LOW (ref 4.22–5.81)
Retic Count, Absolute: 86.5 10*3/uL (ref 19.0–186.0)

## 2012-06-29 LAB — IRON AND TIBC
Iron: 32 ug/dL — ABNORMAL LOW (ref 42–135)
UIBC: 384 ug/dL (ref 125–400)

## 2012-06-29 LAB — VITAMIN B12: Vitamin B-12: 1613 pg/mL — ABNORMAL HIGH (ref 211–911)

## 2012-06-29 LAB — FOLATE: Folate: >20 ng/mL

## 2012-06-29 MED ORDER — TORSEMIDE 20 MG PO TABS: 20.0000 mg | ORAL_TABLET | Freq: Every day | ORAL | Status: DC

## 2012-06-29 NOTE — Progress Notes (Signed)
Patient ID: Marcus [name], male   DOB: March 19, 1929, 79 y.o.   MRN: [id number] Subjective:  No chest pain or sob. I feel better.  Objective:  Vital Signs in the last 24 hours: Temp:  [96.9 F (36.1 C)-98.1 F (36.7 C)] 98.1 F (36.7 C) (02/09 0605) Pulse Rate:  [62-86] 68 (02/09 0605) Resp:  [16-25] 18 (02/09 0605) BP: (104-161)/(61-87) 104/61 mmHg (02/09 0605) SpO2:  [91 %-100 %] 91 % (02/09 0605) Weight:  [141 lb 5 oz (64.1 kg)-141 lb 8 oz (64.184 kg)] 141 lb 5 oz (64.1 kg) (02/09 0605)  Intake/Output from previous day: 02/08 0701 - 02/09 0700 In: 120 [P.O.:120] Out: 4465 [Urine:4465] Intake/Output from this shift: Total I/O In: 300 [P.O.:300] Out: -   Physical Exam: Well appearing elderly man, NAD HEENT: Unremarkable Neck:  6 cm JVD, no thyromegally Lungs:  Clear with no wheezes, rales, or rhonchi HEART:  Regular rate rhythm, no murmurs, no rubs, no clicks Abd:  soft, positive bowel sounds, no organomegally, no rebound, no guarding Ext:  2 plus pulses, no edema, no cyanosis, no clubbing Skin:  No rashes no nodules Neuro:  CN II through XII intact, motor grossly intact  Lab Results:  Recent Labs  06/28/12 1044  WBC 3.7*  HGB 9.9*  PLT 132*    Recent Labs  06/28/12 1044 06/29/12 0752  NA 135 140  K 4.6 4.0  CL 100 103  CO2 26 27  GLUCOSE 121* 95  BUN 25* 24*  CREATININE 1.15 1.03    Recent Labs  06/28/12 1044 06/28/12 2110  TROPONINI <0.30 <0.30   Hepatic Function Panel No results found for this basename: PROT, ALBUMIN, AST, ALT, ALKPHOS, BILITOT, BILIDIR, IBILI,  in the last 72 hours No results found for this basename: CHOL,  in the last 72 hours No results found for this basename: PROTIME,  in the last 72 hours  Imaging: Dg Chest Port 1 View  06/28/2012  *RADIOLOGY REPORT*  Clinical Data: Shortness of breath  PORTABLE CHEST - 1 VIEW  Comparison: Chest x-ray of 05/26/2012  Findings: The lungs are not well aerated and there does appear to be  pulmonary vascular congestion present.  Also there appears to be a right effusion some of which may be loculated over the right lung apex and lateral right upper hemithorax.  Cardiomegaly is stable. A permanent pacemaker is noted.  Median sternotomy sutures remain.  IMPRESSION:  1.  Probable mild congestive heart failure with cardiomegaly and pulmonary vascular congestion. 2.  Increase in right pleural effusion some of which appears to be loculated overlying the right lung apex.   Original Report Authenticated By: Ivar Drape, M.D.     Cardiac Studies: Tele - nsr Assessment/Plan:  1. Acute on chronic systolic CHF - recommend continued current meds including demadex 20 mg daily. He may be ready for discharge this afternoon. Will ambulate. He appears to be euvolemic. Would restart lisinopril 2.5 mg as an outpatient. He will need early followup with BMP next week and followup with a PA/NP in 2 weeks. Check daily weights.  LOS: 1 day    Ridhima Golberg,M.D. 06/29/2012, 9:51 AM

## 2012-06-29 NOTE — Discharge Summary (Signed)
Discharge Summary   Patient ID: CORDERIUS GASKA,  MRN: [id number], DOB/AGE: 1929/03/30 79 y.o.  Admit date: 06/28/2012 Discharge date: 06/29/2012  Primary Physician: Default, Provider, MD Primary Cardiologist: Liam Rogers, MD  Discharge Diagnoses Principal Problem:   Acute on chronic systolic CHF (congestive heart failure)  - Likely secondary to inadequate dose of Lasix vs poor absorption  - Switched to torsemide on discharge for increased absorption  - To resume low-dose ACEi as an OP (BP within normal limits this admission)  - BMET on follow-up  - Consider referral to heart failure clinic as an OP Active Problems:   Essential hypertension, benign   CAD  - Defer further ischemic work-up to primary cardiologist on follow-up (significant ischemic history)   SICK SINUS SYNDROME  - s/p Pacemaker-Medtronic  - not a candidate for CRT upgrade per Dr. Lovena Le   Ischemic cardiomyopathy   Hyperlipidemia   Normocytic anemia  - No active bleeding  - Hgb/Hct stable at 9.9/29.3  - Retic count WNL l    Paroxysmal atrial flutter  - maintained A- and V-paced rhythms  - to continue Coumadin   Allergies No Known Allergies  Diagnostic Studies/Procedures  PORTABLE CHEST X-RAY - 06/28/12  IMPRESSION:  1. Probable mild congestive heart failure with cardiomegaly and  pulmonary vascular congestion.  2. Increase in right pleural effusion some of which appears to be  loculated overlying the right lung apex.  History of Present Illness Marcus [name] is a 79 y.o. male who was observed overnight with the above problem list. He has a history of CAD s/p CABG & PCI, ischemic cardiomyopathy (EF 35-40%), symptomatic bradycardia s/p Medtronic PPM, PAF (on Eliquis), HLD and h/o prostate CA. He recently followed up with Dr. Acie Fredrickson on 06/25/12 after being hospitalized for PNA and CHF treated with antibiotics and diuretics. His pacemaker was checked on follow-up, and he was found to be in atrial flutter  post-discharge. An echo performed last month revealed reduced EF. Lab work on follow-up did indicate a stable normocytic anemia compared to prior results, otherwise no abnormalities on CMET. Recent FOBTs negative x 5. Anemia with loss of atrial kick was thought to be contributing to his decompensation. He was started on Eliquis, his symptoms worsened, and he was cardioverted with the thought that restoring an atrial kick would improve his CHF and symptoms. He was successfully cardioverted to an atrial paced rhythm on 06/26/12 at [facility]. Additionally, BP was noted to be labile and ACEi was held.   He presented to [facility] ED yesterday after experiencing acutely worsening dyspnea this AM, LE edema and weight gain. Baseline weight 142 lbs. 147 lbs today. He reports PND late last night which progressively worsened throughout the night. He usually able to lay flat, but endorsed significant orthopnea. EMS was called. He does note a chest "fullness" at times, not related to exertion. No active bleeding. No fevers, chills, cough, urinary or bowel changes. No increased salt intake. Compliant with meds. TSH normal last month.   EKG in the ED revealed A- and V-pacing. Return within normal limits. Chest x-ray as above did reveal probable mild CHF with cardiomegaly and pulmonary vascular congestion. There was an increase in right pleural effusion which was possibly loculated. The patient was given IV Lasix in the ED with considerable diuresis and symptom improvement. The patient was seen with Dr. Haroldine Laws, and the decision was left up to the patient would like to remain in observation for additional night while he diuresed, otherwise he was  felt stable to be discharged from the emergency department with close followup. The patient elected for the former and he remained stable overnight.   Hospital Course   He formally ruled out and was started on torsemide 20 mg daily beginning this morning. Total intake and output -4045 cc.  Weight today is 141 pounds. His symptoms have improved and joined with the halls. He has been seen by Dr. Lovena Le who recommends if he ambulates without issue, he will be discharged today with close follow-up in the office. This will be scheduled for in 7 days given his CHF status. He is also recommended to restart lisinopril 2.5 mg as an outpatient. And to obtain a basic metabolic panel in followup. He has also been advised to continue his daily monitoring of weights and symptoms. This information, including supplemental heart failure instructions, has been clearly outlined in the discharge AVS. Of note, Dr. Lovena Le did not find the patient to be a suitable candidate for CRT upgrade given QRS < 120 ms and lack of considerable V-pacing.   Discharge Vitals:  Blood pressure 102/68, pulse 68, temperature 97.6 F (36.4 C), temperature source Oral, resp. rate 18, height 5\' 10"  (1.778 m), weight 64.1 kg (141 lb 5 oz), SpO2 94.00%.   Labs: Recent Labs     06/28/12  1044  WBC  3.7*  HGB  9.9*  HCT  29.3*  MCV  93.0  PLT  132*    Recent Labs  06/29/12 0752  RETICCTPCT 2.5    Recent Labs Lab 06/25/12 1215 06/28/12 1044 06/29/12 0752  NA 140 135 140  K 3.7 4.6 4.0  CL 104 100 103  CO2 30 26 27   BUN 30* 25* 24*  CREATININE 1.2 1.15 1.03  CALCIUM 9.0 9.4 9.7  PROT 6.4  --   --   BILITOT 1.0  --   --   ALKPHOS 40  --   --   ALT 16  --   --   AST 22  --   --   GLUCOSE 96 121* 95   Recent Labs     06/28/12  1044  06/28/12  2110  TROPONINI  <0.30  <0.30   Disposition:  Discharge Orders   Future Appointments Provider Department Dept Phone   07/02/2012 10:00 AM [facility] [employer] [place] [facility] [place]) [phone]   07/22/2012 3:00 PM Thayer Headings, MD [place]) [phone]   08/11/2012 9:40 AM [facility] [employer] Main Office [place]   08/15/2012 9:30 AM Thayer Headings, MD [place]) [phone]   Future Orders Complete By Expires     Diet - low sodium heart healthy  As directed     Increase activity slowly  As directed           Follow-up Information   Follow up with [facility]. (Office will call you with an appointment date & time. )    Contact information:   [address] [place] [phone]       Discharge Medications:    Medication List    STOP taking these medications       furosemide 40 MG tablet  Commonly known as:  LASIX      TAKE these medications       apixaban 5 MG Tabs tablet  Commonly known as:  ELIQUIS  Take 5 mg by mouth 2 (two) times daily.     aspirin 81 MG  tablet  Take 81 mg by mouth daily.     CALCIUM PO  Take 1 tablet by mouth daily.     carvedilol 3.125 MG tablet  Commonly known as:  COREG  Take 3.125 mg by mouth 2 (two) times daily with a meal.     COQ10 PO  Take 1 tablet by mouth daily.     fish oil-omega-3 fatty acids 1000 MG capsule  Take 1 g by mouth daily.     KRILL OIL PO  Take by mouth daily.     LYCOPENE PO  Take 1 tablet by mouth daily.     niacin 500 MG CR tablet  Commonly known as:  NIASPAN  Take 2,000 mg by mouth at bedtime.     potassium chloride SA 20 MEQ tablet  Commonly known as:  K-DUR,KLOR-CON  Take 20 mEq by mouth 2 (two) times daily.     Selenium 200 MCG Caps  Take by mouth daily.     torsemide 20 MG tablet  Commonly known as:  DEMADEX  Take 1 tablet (20 mg total) by mouth daily.     VITAMIN C PO  Take 1 tablet by mouth daily.     VITAMIN D PO  Take 1 capsule by mouth daily.     VITAMIN E PO  Take 1 capsule by mouth daily.       Outstanding Labs/Studies: BMET in 1 week  Duration of Discharge Encounter: Greater than 30 minutes including physician time.  Signed, R. Valeria Batman, PA-C 06/29/2012, 1:08 PM

## 2012-06-29 NOTE — Progress Notes (Signed)
Utilization Review Completed.Donne Anon T2/01/2013

## 2012-06-29 NOTE — Progress Notes (Addendum)
Discharge instructions given to client and wife, both verbalized understanding.  Congestive Heart Failure education reviewed with patient and wife.

## 2012-07-01 NOTE — Telephone Encounter (Signed)
Pt has been in the hospital since phone call with CHF exacerbation. meds were changed, pt doing better, reviewed importance of daily wts and when to call- 3-5 lb weight rule and avoiding salt, told to call with any questions, current weight and bp are stable per wife.

## 2012-07-01 NOTE — Addendum Note (Signed)
Addended by: Miaya Lafontant, [place] N on: 07/01/2012 04:51 PM   Modules accepted: Orders

## 2012-07-02 ENCOUNTER — Encounter: Payer: Medicare Other | Admitting: *Deleted

## 2012-07-04 ENCOUNTER — Ambulatory Visit: Payer: Medicare Other | Admitting: Cardiovascular Disease

## 2012-07-17 ENCOUNTER — Encounter: Payer: Self-pay | Admitting: Cardiovascular Disease

## 2012-07-22 ENCOUNTER — Encounter: Payer: Self-pay | Admitting: Cardiovascular Disease

## 2012-07-22 ENCOUNTER — Other Ambulatory Visit (INDEPENDENT_AMBULATORY_CARE_PROVIDER_SITE_OTHER): Payer: Medicare Other

## 2012-07-22 ENCOUNTER — Ambulatory Visit (INDEPENDENT_AMBULATORY_CARE_PROVIDER_SITE_OTHER): Payer: Medicare Other | Admitting: Cardiovascular Disease

## 2012-07-22 VITALS — BP 100/68 | HR 65 | Ht 70.0 in | Wt 143.1 lb

## 2012-07-22 DIAGNOSIS — I5023 Acute on chronic systolic (congestive) heart failure: Secondary | ICD-10-CM | POA: Diagnosis not present

## 2012-07-22 DIAGNOSIS — R0989 Other specified symptoms and signs involving the circulatory and respiratory systems: Secondary | ICD-10-CM

## 2012-07-22 DIAGNOSIS — I509 Heart failure, unspecified: Secondary | ICD-10-CM

## 2012-07-22 NOTE — Assessment & Plan Note (Signed)
His symptoms of congestive heart failure have improved dramatically on the Torsemide.   We will get a BMP today.  I will see him in the office in 3 months.  At this point he is stable and I do not think he needs to be referred to the Advanced Heart Failure clinic.  I did offer that to him but he did not think that would be necessary at this point.

## 2012-07-22 NOTE — Patient Instructions (Addendum)
Your physician recommends that you return for lab work in: [place] recommends that you schedule a follow-up appointment in: [place] physician recommends that you continue on your current medications as directed. Please refer to the Current Medication list given to you today.

## 2012-07-22 NOTE — Progress Notes (Signed)
Marcus [name] Date of Birth: Oct 19, 1928 Medical Record [id number]  Problems: 1. Coronary artery disease- status post CABG and PCI 2. Pacemaker placement 3. History of prostate cancer 4. Ischemic cardiopathy with an ejection fraction of 35-40% 5. Atrial fibrillation  History of Present Illness: Marcus [name] is seen back today for a work in visit . He has an ischemic CM with an EF of 35 to 40% per recent echo, known CAD remote CABG/PCI - managed medically, HLD, prostate cancer, underlying pacemaker due to bradycardia.   He was most recently admitted with CHF and cough, fever. Treated with diuretics and antibiotics. When I saw him back I got his pacemaker checked. He was in atrial flutter and the dates correlated with his recent admission. This may have been the trigger.  I started Eliquis. He was quite unsure as to taking it.   He comes back today. He is here with his wife. Feels "crummy" per the wife. He is tired. Not really short of breath. Cough has almost resolved. No chest pain. No swelling. Weight has been fairly stable at home. No extra lasix. Trying to watch his salt. Not dizzy or lightheaded. He says he is taking his medicines.   Feb. 5, 2014: He has had more "bad" days than "good" days.  He has been having some worsening dyspnea recently.  He denies any PND or orthopnea.  He is short of breath at rest - worsens with any exertion.   These symptoms have been gradually and progressively worsening over the past week or so.  July 22, 2012:   Marcus [name] was admitted with worsening dysnea recent.  He had a cardioversion.   TEE revealed: Left ventricle: Systolic function was mildly to moderately reduced. The estimated ejection fraction was in the range of 40% to 45%. Inferoposterior and lateral akinesis. - Aortic valve: No evidence of vegetation. Trivial regurgitation. - Mitral valve: No evidence of vegetation. Moderate regurgitation. - Left atrium: The atrium was moderately to  severely dilated. No evidence of thrombus in the atrial cavity or appendage. - Atrial septum: No defect or patent foramen ovale was identified. - Tricuspid valve: No evidence of vegetation. - Pulmonic valve: No evidence of vegetation.  His x-ray feeling quite well today. A TEE performed reveals some improvement of his left ventricular systolic function.  His breathing has improved on the Torsemide ( instead of Furosemide).     Current Outpatient Prescriptions on File Prior to Visit  Medication Sig Dispense Refill  . apixaban (ELIQUIS) 5 MG TABS tablet Take 5 mg by mouth 2 (two) times daily.      . Ascorbic Acid (VITAMIN C PO) Take 1 tablet by mouth daily.       Marland Kitchen aspirin 81 MG tablet Take 81 mg by mouth daily.      Marland Kitchen CALCIUM PO Take 1 tablet by mouth daily.       . carvedilol (COREG) 3.125 MG tablet Take 3.125 mg by mouth 2 (two) times daily with a meal.      . Cholecalciferol (VITAMIN D PO) Take 1 capsule by mouth daily.       . Coenzyme Q10 (COQ10 PO) Take 1 tablet by mouth daily.       . fish oil-omega-3 fatty acids 1000 MG capsule Take 1 g by mouth daily.       Marland Kitchen KRILL OIL PO Take by mouth daily.        Marland Kitchen LYCOPENE PO Take 1 tablet by mouth daily.       Marland Kitchen  niacin (NIASPAN) 500 MG CR tablet Take 2,000 mg by mouth at bedtime.        . potassium chloride SA (K-DUR,KLOR-CON) 20 MEQ tablet Take 20 mEq by mouth 2 (two) times daily.      . Selenium 200 MCG CAPS Take by mouth daily.        Marland Kitchen torsemide (DEMADEX) 20 MG tablet Take 1 tablet (20 mg total) by mouth daily.  30 tablet  3  . VITAMIN E PO Take 1 capsule by mouth daily.        No current facility-administered medications on file prior to visit.    No Known Allergies  Past Medical History  Diagnosis Date  . Coronary artery disease     a. s/p remote CABG x 2(VG->OM, LIMA->LAD;  b. Late 90's s/p PCI of RCA;  c. 12/09 Cath/PCI: LM 75m, 70-80d (3.0x34mm Xience DES), LAD100p, LCX 80-90p (2.25x73mmTaxus Atom DES), RCA 100d (2.5x70mm  Xience DES), VG->OM 100, LIMA->LAD nl, EF 30-35%.  . Hyperlipidemia   . Prostate cancer   . PAF (paroxysmal atrial fibrillation)   . Ischemic cardiomyopathy     a. EF 35-40% by echo 05/2012  . Symptomatic bradycardia     a. 08/2003 s/p MDT Enpulse E2DR01 Dual chamber PPM ser # [id number] H.  . Systolic CHF     Past Surgical History  Procedure Laterality Date  . Coronary artery bypass graft    . Insert / replace / remove pacemaker    . Doppler echocardiography  06/24/2002    EF 60-65%  . Tee without cardioversion  06/26/2012    Procedure: TRANSESOPHAGEAL ECHOCARDIOGRAM (TEE);  Surgeon: Lelon Perla, MD;  Location: [facility] ENDOSCOPY;  Service: Cardiovascular;  Laterality: [name];  . Cardioversion  06/26/2012    Procedure: CARDIOVERSION;  Surgeon: Lelon Perla, MD;  Location: [facility] ENDOSCOPY;  Service: Cardiovascular;  Laterality: [name];    History  Smoking status  . Former Smoker  . Quit date: 02/10/1971  Smokeless tobacco  . Not on file    History  Alcohol Use No    Family History  Problem Relation Age of Onset  . Hypertension Mother   . Cancer Mother   . Alcohol abuse Father     Review of Systems: The review of systems is per the HPI.  All other systems were reviewed and are negative.  Physical Exam: BP 100/68  Pulse 65  Ht 5\' 10"  (1.778 m)  Wt 143 lb 1.9 oz (64.919 kg)  BMI 20.54 kg/m2  SpO2 99% Patient is alert, looks chronically ill but in no acute distress. Skin is warm and dry. Color is normal.   HEENT is unremarkable. Normocephalic/atraumatic. PERRL. Sclera are nonicteric. Neck is supple. No masses. No JVD.  Lungs are clear.  Cardiac exam shows a regular rhythm.  He has occasional premature beats. . Rate is controlled.  Abdomen is soft.  Extremities are without edema.  Gait and ROM are intact. No gross neurologic deficits noted.   LABORATORY DATA: Lab Results  Component Value Date   WBC 3.7* 06/28/2012   HGB 9.9* 06/28/2012   HCT 29.3* 06/28/2012   PLT 132*  06/28/2012   GLUCOSE 95 06/29/2012   CHOL 175 02/28/2011   TRIG 37.0 02/28/2011   HDL 77.40 02/28/2011   LDLCALC 90 02/28/2011   ALT 16 06/25/2012   AST 22 06/25/2012   NA 140 06/29/2012   K 4.0 06/29/2012   CL 103 06/29/2012   CREATININE 1.03 06/29/2012   BUN 24* 06/29/2012  CO2 27 06/29/2012   TSH 0.736 05/24/2012   INR 1.47 06/28/2012   EKG:  Assessment / Plan:

## 2012-07-23 LAB — CBC
Hemoglobin: 10.7 g/dL — ABNORMAL LOW (ref 13.0–17.0)
MCHC: 32.6 g/dL (ref 30.0–36.0)
Platelets: 107 10*3/uL — ABNORMAL LOW (ref 150.0–400.0)
RBC: 3.64 Mil/uL — ABNORMAL LOW (ref 4.22–5.81)
WBC: 3.8 10*3/uL — ABNORMAL LOW (ref 4.5–10.5)

## 2012-07-23 LAB — BASIC METABOLIC PANEL
GFR: 53.4 mL/min — ABNORMAL LOW (ref >60.00)
Glucose, Bld: 92 mg/dL (ref 70–99)
Potassium: 3.7 mEq/L (ref 3.5–5.1)
Sodium: 141 mEq/L (ref 135–145)

## 2012-08-11 ENCOUNTER — Ambulatory Visit (INDEPENDENT_AMBULATORY_CARE_PROVIDER_SITE_OTHER): Payer: Medicare Other | Admitting: *Deleted

## 2012-08-11 ENCOUNTER — Encounter: Payer: Self-pay | Admitting: Internal Medicine

## 2012-08-11 ENCOUNTER — Other Ambulatory Visit: Payer: Self-pay | Admitting: Internal Medicine

## 2012-08-11 DIAGNOSIS — Z950 Presence of cardiac pacemaker: Secondary | ICD-10-CM

## 2012-08-11 DIAGNOSIS — I495 Sick sinus syndrome: Secondary | ICD-10-CM | POA: Diagnosis not present

## 2012-08-15 ENCOUNTER — Ambulatory Visit: Payer: Medicare Other | Admitting: Cardiovascular Disease

## 2012-08-20 LAB — REMOTE PACEMAKER DEVICE
BATTERY VOLTAGE: 2.62 V
BMOD-0003RV: 30
BMOD-0005RV: 95 {beats}/min
VENTRICULAR PACING PM: 58

## 2012-08-21 ENCOUNTER — Ambulatory Visit (INDEPENDENT_AMBULATORY_CARE_PROVIDER_SITE_OTHER): Payer: Medicare Other | Admitting: Internal Medicine

## 2012-08-21 ENCOUNTER — Encounter: Payer: Self-pay | Admitting: *Deleted

## 2012-08-21 ENCOUNTER — Encounter: Payer: Self-pay | Admitting: Internal Medicine

## 2012-08-21 ENCOUNTER — Other Ambulatory Visit: Payer: Self-pay | Admitting: *Deleted

## 2012-08-21 VITALS — BP 111/60 | HR 68 | Ht 70.0 in | Wt 151.4 lb

## 2012-08-21 DIAGNOSIS — I495 Sick sinus syndrome: Secondary | ICD-10-CM

## 2012-08-21 DIAGNOSIS — I4891 Unspecified atrial fibrillation: Secondary | ICD-10-CM

## 2012-08-21 DIAGNOSIS — Z950 Presence of cardiac pacemaker: Secondary | ICD-10-CM

## 2012-08-21 LAB — CBC WITH DIFFERENTIAL/PLATELET
Basophils Relative: 1.4 % (ref 0.0–3.0)
Eosinophils Relative: 3.4 % (ref 0.0–5.0)
HCT: 31.5 % — ABNORMAL LOW (ref 39.0–52.0)
Lymphs Abs: 1.1 10*3/uL (ref 0.7–4.0)
MCV: 88.8 fl (ref 78.0–100.0)
Monocytes Absolute: 0.5 10*3/uL (ref 0.1–1.0)
Monocytes Relative: 10.1 % (ref 3.0–12.0)
RBC: 3.54 Mil/uL — ABNORMAL LOW (ref 4.22–5.81)
WBC: 5.2 10*3/uL (ref 4.5–10.5)

## 2012-08-21 LAB — BASIC METABOLIC PANEL
Chloride: 98 mEq/L (ref 96–112)
GFR: 43.94 mL/min — ABNORMAL LOW (ref >60.00)
Potassium: 3.6 mEq/L (ref 3.5–5.1)
Sodium: 134 mEq/L — ABNORMAL LOW (ref 135–145)

## 2012-08-21 NOTE — Patient Instructions (Signed)
Pacemaker generator change on 08/27/12  See instruction sheet

## 2012-08-21 NOTE — Assessment & Plan Note (Signed)
His Medtronic pacemaker is at elective replacement. We'll schedule pacemaker generator change in the next few weeks.

## 2012-08-21 NOTE — Progress Notes (Signed)
HPI [name] returns today for followup. He is an 79 year old man with a history of symptomatic bradycardia, status post permanent pacemaker insertion. His pacemaker reached elective replacement over a month ago. In the interim, he has done well. He denies chest pain or shortness of breath. He has mild peripheral edema. No syncope. No Known Allergies   Current Outpatient Prescriptions  Medication Sig Dispense Refill  . apixaban (ELIQUIS) 5 MG TABS tablet Take 5 mg by mouth 2 (two) times daily.      . Ascorbic Acid (VITAMIN C PO) Take 1 tablet by mouth daily.       Marland Kitchen aspirin 81 MG tablet Take 81 mg by mouth daily.      Marland Kitchen CALCIUM PO Take 1 tablet by mouth daily.       . carvedilol (COREG) 3.125 MG tablet Take 3.125 mg by mouth 2 (two) times daily with a meal.      . Cholecalciferol (VITAMIN D PO) Take 1 capsule by mouth daily.       . Coenzyme Q10 (COQ10 PO) Take 1 tablet by mouth daily.       . fish oil-omega-3 fatty acids 1000 MG capsule Take 1 g by mouth daily.       Marland Kitchen KRILL OIL PO Take by mouth daily.        Marland Kitchen LYCOPENE PO Take 1 tablet by mouth daily.       . niacin (NIASPAN) 500 MG CR tablet Take 2,000 mg by mouth at bedtime.        . potassium chloride SA (K-DUR,KLOR-CON) 20 MEQ tablet Take 20 mEq by mouth daily.       . Selenium 200 MCG CAPS Take by mouth daily.        Marland Kitchen torsemide (DEMADEX) 20 MG tablet Take 1 tablet (20 mg total) by mouth daily.  30 tablet  3  . VITAMIN E PO Take 1 capsule by mouth daily.        No current facility-administered medications for this visit.     Past Medical History  Diagnosis Date  . Coronary artery disease     a. s/p remote CABG x 2(VG->OM, LIMA->LAD;  b. Late 90's s/p PCI of RCA;  c. 12/09 Cath/PCI: LM 25m, 70-80d (3.0x40mm Xience DES), LAD100p, LCX 80-90p (2.25x75mmTaxus Atom DES), RCA 100d (2.5x26mm Xience DES), VG->OM 100, LIMA->LAD nl, EF 30-35%.  . Hyperlipidemia   . Prostate cancer   . PAF (paroxysmal atrial fibrillation)   .  Ischemic cardiomyopathy     a. EF 35-40% by echo 05/2012  . Symptomatic bradycardia     a. 08/2003 s/p MDT Enpulse E2DR01 Dual chamber PPM ser # [id number] H.  . Systolic CHF     ROS:   All systems reviewed and negative except as noted in the HPI.   Past Surgical History  Procedure Laterality Date  . Coronary artery bypass graft    . Insert / replace / remove pacemaker    . Doppler echocardiography  06/24/2002    EF 60-65%  . Tee without cardioversion  06/26/2012    Procedure: TRANSESOPHAGEAL ECHOCARDIOGRAM (TEE);  Surgeon: Lelon Perla, MD;  Location: [facility] ENDOSCOPY;  Service: Cardiovascular;  Laterality: [name];  . Cardioversion  06/26/2012    Procedure: CARDIOVERSION;  Surgeon: Lelon Perla, MD;  Location: [facility] ENDOSCOPY;  Service: Cardiovascular;  Laterality: [name];     Family History  Problem Relation Age of Onset  . Hypertension Mother   . Cancer Mother   . Alcohol  abuse Father      History   Social History  . Marital Status: Married    Spouse Name: [name]    Number of Children: [name]  . Years of Education: [name]   Occupational History  . Not on file.   Social History Main Topics  . Smoking status: Former Smoker    Quit date: 02/10/1971  . Smokeless tobacco: Not on file  . Alcohol Use: No  . Drug Use: No  . Sexually Active: No   Other Topics Concern  . Not on file   Social History Narrative   Lives in [place] with wife.  Retired.     BP 111/60  Pulse 68  Ht 5\' 10"  (1.778 m)  Wt 151 lb 6.4 oz (68.675 kg)  BMI 21.72 kg/m2  Physical Exam:  Well appearing 79 year old man,NAD HEENT: Unremarkable Neck:  7 cm JVD, no thyromegally Lungs:  Clear with no wheezes, rales, or rhonchi. Well-healed pacemaker incision. HEART:  Regular rate rhythm, no murmurs, no rubs, no clicks Abd:  soft, positive bowel sounds, no organomegally, no rebound, no guarding Ext:  2 plus pulses, no edema, no cyanosis, no clubbing Skin:  No rashes no nodules Neuro:  CN II through XII intact,  motor grossly intact  DEVICE  Normal device function.  See PaceArt for details.   Assess/Plan:

## 2012-08-21 NOTE — Assessment & Plan Note (Signed)
His ventricular rate is well controlled. No change in medical therapy. He will hold his anticoagulation for 1 day prior to history and her change.

## 2012-08-21 NOTE — Addendum Note (Signed)
Addended by: Janan Halter F on: 08/21/2012 11:47 AM   Modules accepted: Orders

## 2012-08-22 ENCOUNTER — Encounter (HOSPITAL_COMMUNITY): Payer: Self-pay

## 2012-08-22 ENCOUNTER — Telehealth: Payer: Self-pay | Admitting: Cardiovascular Disease

## 2012-08-22 NOTE — Telephone Encounter (Signed)
Spoke with wife, pt at work. Describes a small skin tear obtained last night and now/ 24 hrs later it is still oozing even with a compression dressing. Last week had cleared brush and had several small skin nicks that bled enough to saturate his socks.  They are concerned because he is having a battery change 08/27/12 and was told to hold one dose of his Eliquis.   They feel it is not enough time held prior to battery change.  Informed that I will forward MSG to Dr Lovena Le Luella Cook and will receive a call back, if not today then Monday.  Pt agreed to plan.

## 2012-08-22 NOTE — Telephone Encounter (Signed)
Spoke with wife and let her know we were going to hold the one dose prior and that I would ask Dr Lovena Le if he would like to hold any more due to the concerns that they have

## 2012-08-22 NOTE — Telephone Encounter (Signed)
New Prob    States pt was placed on blood thinner and is now bleeding frequently and fast but not pouring out. Concerned and would like to speak to nurse about this.

## 2012-08-26 MED ORDER — CEFAZOLIN SODIUM-DEXTROSE 2-3 GM-% IV SOLR
2.0000 g | INTRAVENOUS | Status: DC
Start: 2012-08-27 — End: 2012-08-27
  Filled 2012-08-26 (×2): qty 50

## 2012-08-26 MED ORDER — SODIUM CHLORIDE 0.9 % IR SOLN
80.0000 mg | Status: DC
  Filled 2012-08-26: qty 2

## 2012-08-26 NOTE — Telephone Encounter (Signed)
Pt informed to hold two days but the procedure is tomorrow. Pt will hold for today.

## 2012-08-26 NOTE — Telephone Encounter (Signed)
Ok to hold eliquis for 2 days prior to procedure.

## 2012-08-27 ENCOUNTER — Ambulatory Visit (HOSPITAL_COMMUNITY)
Admission: RE | Admit: 2012-08-27 | Discharge: 2012-08-27 | Disposition: A | Discharge status: Home / Self Care | Payer: Medicare Other | Source: Ambulatory Visit | Attending: Internal Medicine | Admitting: Internal Medicine

## 2012-08-27 ENCOUNTER — Encounter (HOSPITAL_COMMUNITY)
Admission: RE | Disposition: A | Discharge status: Home / Self Care | Payer: Self-pay | Source: Ambulatory Visit | Attending: Internal Medicine

## 2012-08-27 DIAGNOSIS — E785 Hyperlipidemia, unspecified: Secondary | ICD-10-CM | POA: Insufficient documentation

## 2012-08-27 DIAGNOSIS — I495 Sick sinus syndrome: Secondary | ICD-10-CM | POA: Diagnosis not present

## 2012-08-27 DIAGNOSIS — I4892 Unspecified atrial flutter: Secondary | ICD-10-CM | POA: Insufficient documentation

## 2012-08-27 DIAGNOSIS — Z45018 Encounter for adjustment and management of other part of cardiac pacemaker: Secondary | ICD-10-CM | POA: Diagnosis not present

## 2012-08-27 DIAGNOSIS — I5020 Unspecified systolic (congestive) heart failure: Secondary | ICD-10-CM | POA: Diagnosis not present

## 2012-08-27 DIAGNOSIS — I2510 Atherosclerotic heart disease of native coronary artery without angina pectoris: Secondary | ICD-10-CM | POA: Diagnosis not present

## 2012-08-27 DIAGNOSIS — C61 Malignant neoplasm of prostate: Secondary | ICD-10-CM | POA: Diagnosis not present

## 2012-08-27 DIAGNOSIS — I2589 Other forms of chronic ischemic heart disease: Secondary | ICD-10-CM | POA: Diagnosis not present

## 2012-08-27 DIAGNOSIS — I509 Heart failure, unspecified: Secondary | ICD-10-CM | POA: Insufficient documentation

## 2012-08-27 DIAGNOSIS — Z87891 Personal history of nicotine dependence: Secondary | ICD-10-CM | POA: Insufficient documentation

## 2012-08-27 DIAGNOSIS — Z7982 Long term (current) use of aspirin: Secondary | ICD-10-CM | POA: Diagnosis not present

## 2012-08-27 DIAGNOSIS — Z79899 Other long term (current) drug therapy: Secondary | ICD-10-CM | POA: Diagnosis not present

## 2012-08-27 HISTORY — PX: PERMANENT PACEMAKER GENERATOR CHANGE: SHX6022

## 2012-08-27 LAB — SURGICAL PCR SCREEN: MRSA, PCR: NEGATIVE

## 2012-08-27 SURGERY — PERMANENT PACEMAKER GENERATOR CHANGE
Anesthesia: LOCAL

## 2012-08-27 MED ORDER — MUPIROCIN 2 % EX OINT
TOPICAL_OINTMENT | Freq: Two times a day (BID) | CUTANEOUS | Status: DC
  Administered 2012-08-27: 1 via NASAL

## 2012-08-27 MED ORDER — MUPIROCIN 2 % EX OINT
TOPICAL_OINTMENT | CUTANEOUS | Status: AC
  Filled 2012-08-27: qty 22

## 2012-08-27 MED ORDER — FENTANYL CITRATE 0.05 MG/ML IJ SOLN
INTRAMUSCULAR | Status: AC
  Filled 2012-08-27: qty 2

## 2012-08-27 MED ORDER — SODIUM CHLORIDE 0.9 % IV SOLN
INTRAVENOUS | Status: DC
  Administered 2012-08-27: 11:00:00 via INTRAVENOUS

## 2012-08-27 MED ORDER — LIDOCAINE HCL (PF) 1 % IJ SOLN
INTRAMUSCULAR | Status: AC
  Filled 2012-08-27: qty 60

## 2012-08-27 MED ORDER — MIDAZOLAM HCL 5 MG/5ML IJ SOLN
INTRAMUSCULAR | Status: AC
  Filled 2012-08-27: qty 5

## 2012-08-27 MED ORDER — CHLORHEXIDINE GLUCONATE 4 % EX LIQD: 60.0000 mL | Freq: Once | CUTANEOUS | Status: DC

## 2012-08-27 NOTE — Op Note (Signed)
PM generator removal and insertion of a new PPM without immediate complication.D# [id number].

## 2012-08-28 NOTE — Op Note (Signed)
NAMEMarland Kitchen  NEYMAR, THEESFELD NO.:  [id number]  MEDICAL RECORD NO.:  [id number]  LOCATION:  [facility]                         FACILITY:  [facility]  PHYSICIAN:  Champ Mungo. Lovena Le, MD    DATE OF BIRTH:  1928-06-08  DATE OF PROCEDURE:  08/27/2012 DATE OF DISCHARGE:  08/27/2012                              OPERATIVE REPORT   PROCEDURE PERFORMED:  Removal of a previously implanted dual-chamber pacemaker, which reached elective replacement and insertion of a new dual-chamber device.  INDICATION:  Symptomatic bradycardia.  INTRODUCTION:  The patient is a very pleasant 79 year old male with symptomatic bradycardia status post pacemaker insertion in 2005.  He has reached elective replacement and is now referred for a new device placement and removal of his old device.  PROCEDURE:  After informed consent was obtained, the patient was taken to the diagnostic EP lab in a fasting state.  After usual preparation and draping, intravenous fentanyl and midazolam was given for sedation. 30 mL of lidocaine was infiltrated into the right infraclavicular region.  A 5-cm incision was carried out over this region and electrocautery was utilized to dissect down to the fascial plane.  The pacemaker pocket was entered with electrocautery.  Gentle traction was utilized to remove the generator from its pocket.  The leads were evaluated.  The underlying atrial rhythm was atrial flutter, the flutter waves were 1.5 multivitamin and the pacing impedance was 360 ohms.  The ventricular pacing threshold was 0.8 V at 0.5 milliseconds with R-waves of 11 and impedance of 4 ohms.  With these satisfactory parameters, the new Medtronic Sensia dual-chamber pacemaker was connected to the old atrial and ventricular pacing leads and placed back in the subcutaneous pocket.  The pocket was irrigated with antibiotic irrigation and the incision was closed with 2-0 and 3-0 Vicryl.  Benzoin and Steri-Strips were painted on the  skin, a pressure dressing applied, and the patient was returned to his room in satisfactory condition.  COMPLICATIONS:  There were no immediate procedure complications.  RESULTS:  This demonstrates successful removal of a previously implanted Medtronic dual-chamber pacemaker, insertion of a new dual-chamber device without immediate procedural complication.     Champ Mungo. Lovena Le, MD     GWT/MEDQ  D:  08/27/2012  T:  08/28/2012  Job:  [id number]

## 2012-09-01 ENCOUNTER — Encounter: Payer: Self-pay | Admitting: Gastroenterology

## 2012-09-10 ENCOUNTER — Encounter: Payer: Self-pay | Admitting: Internal Medicine

## 2012-09-10 ENCOUNTER — Other Ambulatory Visit: Payer: Self-pay | Admitting: Internal Medicine

## 2012-09-10 ENCOUNTER — Ambulatory Visit (INDEPENDENT_AMBULATORY_CARE_PROVIDER_SITE_OTHER): Payer: Medicare Other | Admitting: *Deleted

## 2012-09-10 DIAGNOSIS — I4891 Unspecified atrial fibrillation: Secondary | ICD-10-CM | POA: Diagnosis not present

## 2012-09-10 DIAGNOSIS — I4892 Unspecified atrial flutter: Secondary | ICD-10-CM

## 2012-09-10 LAB — PACEMAKER DEVICE OBSERVATION
AL AMPLITUDE: 1.4 mv
BAMS-0001: 150 {beats}/min
BRDY-0002RV: 60 {beats}/min
RV LEAD THRESHOLD: 0.75 V
VENTRICULAR PACING PM: 20

## 2012-09-10 NOTE — Progress Notes (Signed)
Wound check-PPM 

## 2012-09-17 ENCOUNTER — Encounter: Payer: Self-pay | Admitting: Gastroenterology

## 2012-10-08 ENCOUNTER — Ambulatory Visit (INDEPENDENT_AMBULATORY_CARE_PROVIDER_SITE_OTHER): Payer: Medicare Other | Admitting: Gastroenterology

## 2012-10-08 ENCOUNTER — Encounter: Payer: Self-pay | Admitting: Gastroenterology

## 2012-10-08 VITALS — BP 104/60 | HR 76 | Ht 66.54 in | Wt 147.4 lb

## 2012-10-08 DIAGNOSIS — Z1211 Encounter for screening for malignant neoplasm of colon: Secondary | ICD-10-CM

## 2012-10-08 NOTE — Patient Instructions (Addendum)
Please call Dr. Ardis Hughs with any new, concerning GI symptoms. You do not need colon cancer screening currently.

## 2012-10-08 NOTE — Progress Notes (Signed)
Review of pertinent gastrointestinal problems: 1. Elevated risk for colon cancer; mother had colon cancer and pt had adenomatous polyp in past; last colonoscopy 10/2007 jacobs found only hyperplastic polyp  HPI: This is a    very pleasant 79 year old man whom I last saw about 5 years ago.  Sent him a recall letter recently advising him to come in the office to discuss colon cancer screening given his age.   He has PAF (paroxysmal atrial fibrillation) and Ischemic cardiomyopathy ; EF 35-40% by echo 05/2012 . He is on eliquis to thin his blood.  No overt gi bleeding,  No changes in his bowels.  No constipation or significant diarrhea.  Weight is down a bit, unintentionally.  Mother had colon cancer at age 59.     Review of systems: Pertinent positive and negative review of systems were noted in the above HPI section. Complete review of systems was performed and was otherwise normal.    Past Medical History  Diagnosis Date  . Coronary artery disease     a. s/p remote CABG x 2(VG->OM, LIMA->LAD;  b. Late 90's s/p PCI of RCA;  c. 12/09 Cath/PCI: LM 40m, 70-80d (3.0x30mm Xience DES), LAD100p, LCX 80-90p (2.25x72mmTaxus Atom DES), RCA 100d (2.5x35mm Xience DES), VG->OM 100, LIMA->LAD nl, EF 30-35%.  . Hyperlipidemia   . Prostate cancer   . PAF (paroxysmal atrial fibrillation)   . Ischemic cardiomyopathy     a. EF 35-40% by echo 05/2012  . Symptomatic bradycardia     a. 08/2003 s/p MDT Enpulse E2DR01 Dual chamber PPM ser # [id number] H.  . Systolic CHF     Past Surgical History  Procedure Laterality Date  . Coronary artery bypass graft    . Insert / replace / remove pacemaker    . Doppler echocardiography  06/24/2002    EF 60-65%  . Tee without cardioversion  06/26/2012    Procedure: TRANSESOPHAGEAL ECHOCARDIOGRAM (TEE);  Surgeon: Lelon Perla, MD;  Location: [facility] ENDOSCOPY;  Service: Cardiovascular;  Laterality: [name];  . Cardioversion  06/26/2012    Procedure: CARDIOVERSION;  Surgeon:  Lelon Perla, MD;  Location: [place];  Service: Cardiovascular;  Laterality: [name];  . Appendectomy    . Angioplasty      stent placement    Current Outpatient Prescriptions  Medication Sig Dispense Refill  . apixaban (ELIQUIS) 5 MG TABS tablet Take 5 mg by mouth 2 (two) times daily.      . Ascorbic Acid (VITAMIN C PO) Take 1 tablet by mouth daily.       Marland Kitchen aspirin EC 81 MG tablet Take 81 mg by mouth daily.      Marland Kitchen CALCIUM PO Take 1 tablet by mouth daily.       . carvedilol (COREG) 3.125 MG tablet Take 3.125 mg by mouth 2 (two) times daily with a meal.      . Cholecalciferol (VITAMIN D PO) Take 1 capsule by mouth daily.       . Coenzyme Q10 (COQ10 PO) Take 1 tablet by mouth daily.       . fish oil-omega-3 fatty acids 1000 MG capsule Take 1 g by mouth daily.       Marland Kitchen KRILL OIL PO Take by mouth daily.        Marland Kitchen LYCOPENE PO Take 1 tablet by mouth daily.       . niacin (NIASPAN) 1000 MG CR tablet Take 2,000 mg by mouth at bedtime.      . potassium chloride SA (  K-DUR,KLOR-CON) 20 MEQ tablet Take 20 mEq by mouth daily.       . Selenium 200 MCG CAPS Take 1 capsule by mouth daily.       Marland Kitchen torsemide (DEMADEX) 20 MG tablet Take 1 tablet (20 mg total) by mouth daily.  30 tablet  3  . VITAMIN E PO Take 1 capsule by mouth daily.        No current facility-administered medications for this visit.    Allergies as of 10/08/2012  . (No Known Allergies)    Family History  Problem Relation Age of Onset  . Hypertension Mother   . Colon cancer Mother   . Alcohol abuse Father     History   Social History  . Marital Status: Married    Spouse Name: [name]    Number of Children: 0  . Years of Education: [name]   Occupational History  . retired    Social History Main Topics  . Smoking status: Former Smoker    Quit date: 02/10/1971  . Smokeless tobacco: Never Used  . Alcohol Use: No  . Drug Use: No  . Sexually Active: No   Other Topics Concern  . Not on file   Social History Narrative    Lives in [place] with wife.  Retired.       Physical Exam: Ht 5' 6.54" (1.69 m)  Wt 147 lb 6 oz (66.849 kg)  BMI 23.41 kg/m2 Constitutional: generally well-appearing Psychiatric: alert and oriented x3 Eyes: extraocular movements intact Mouth: oral pharynx moist, no lesions Neck: supple no lymphadenopathy Cardiovascular: heart regular rate and rhythm Lungs: clear to auscultation bilaterally Abdomen: soft, nontender, nondistended, no obvious ascites, no peritoneal signs, normal bowel sounds Extremities: no lower extremity edema bilaterally Skin: no lesions on visible extremities    Assessment and plan: 79 y.o. male with   routine risk for colon cancer  I do not think colon cancer screening is a very relevant question for him anymore. Indeed his family history is not as significant as I had previously known. His mother had colon cancer but that was diagnosed when she was in her mid 67s. His last colonoscopy found only hyperplastic polyp. I am very comfortable recommending that he does not need colon cancer screening ever again. He knows to call if he has any significant GI symptoms.

## 2012-10-23 ENCOUNTER — Ambulatory Visit: Payer: Medicare Other | Admitting: Cardiovascular Disease

## 2012-11-05 ENCOUNTER — Telehealth: Payer: Self-pay | Admitting: Cardiovascular Disease

## 2012-11-05 DIAGNOSIS — I5022 Chronic systolic (congestive) heart failure: Secondary | ICD-10-CM

## 2012-11-05 MED ORDER — TORSEMIDE 20 MG PO TABS: 20.0000 mg | ORAL_TABLET | Freq: Every day | ORAL | Status: DC

## 2012-11-05 NOTE — Telephone Encounter (Signed)
New Prob     Pts wife calling in wanting to speak to nurse regarding a referral Dr. Acie Fredrickson suggested to pt. Please call.

## 2012-11-05 NOTE — Telephone Encounter (Signed)
Spoke with wife, pt has not been able to get his Demadex refilled stating that the pharmacy has not been able to contact us x 2 weeks. Pharmacy told pt that since they haven't been able to contact us that he could take his old script of lasix. Pt's weight up 4 lbs. And not feeling well.  Wife instructed to call me anytime there is any problem with a refill or any questions.  Refill completed and told her to have him take it today, told her to call back if not feeling better, I reviewed high sodium foods to avoid.  They requested a ref to CHF clinic, order placed.

## 2012-11-07 NOTE — Telephone Encounter (Signed)
Please investigate ( or pass along to Barnett Applebaum, [place]) why the pharmacy could not reach is for 2 weeks.  If this is true, this is unacceptable.  Have him resume the meds that I had prescribed at our last visit.

## 2012-11-10 NOTE — Telephone Encounter (Signed)
Note forwarded to gina and angel.

## 2012-11-12 ENCOUNTER — Encounter: Payer: Self-pay | Admitting: Cardiovascular Disease

## 2012-11-20 ENCOUNTER — Encounter (HOSPITAL_COMMUNITY): Payer: Medicare Other

## 2012-11-20 ENCOUNTER — Encounter (HOSPITAL_COMMUNITY): Payer: Self-pay

## 2012-11-20 ENCOUNTER — Ambulatory Visit (HOSPITAL_COMMUNITY)
Admission: RE | Admit: 2012-11-20 | Discharge: 2012-11-20 | Disposition: A | Discharge status: Home / Self Care | Payer: Medicare Other | Source: Ambulatory Visit | Attending: Internal Medicine | Admitting: Internal Medicine

## 2012-11-20 VITALS — BP 99/60 | HR 88 | Wt 149.1 lb

## 2012-11-20 DIAGNOSIS — I4891 Unspecified atrial fibrillation: Secondary | ICD-10-CM | POA: Insufficient documentation

## 2012-11-20 DIAGNOSIS — I5022 Chronic systolic (congestive) heart failure: Secondary | ICD-10-CM | POA: Diagnosis not present

## 2012-11-20 MED ORDER — APIXABAN 5 MG PO TABS: 2.5000 mg | ORAL_TABLET | Freq: Two times a day (BID) | ORAL | Status: DC

## 2012-11-20 NOTE — Patient Instructions (Addendum)
Follow up with Dr Acie Fredrickson  Take Eliquis to 2.5 mg twice a day.   If your weight increases to 150 pounds take an extra 20 mg of Torsemide   Do the following things EVERYDAY: 1) Weigh yourself in the morning before breakfast. Write it down and keep it in a log. 2) Take your medicines as prescribed 3) Eat low salt foods-Limit salt (sodium) to 2000 mg per day.  4) Stay as active as you can everyday 5) Limit all fluids for the day to less than 2 liters

## 2012-11-20 NOTE — Assessment & Plan Note (Signed)
Chronic. Rate controlled. Given age and renal function would decrease dose to 2.5mg  bid. We made this change for him.

## 2012-11-20 NOTE — Progress Notes (Signed)
Patient ID: Marcus [name], male   DOB: 02-Dec-1928, 79 y.o.   MRN: [id number]  Weight Range   Baseline proBNP     HPI: Marcus [name] is referred to HF clinic by Dr Acie Fredrickson.   Marcus [name] is an 79 year old with a history of CAD s/p CABG & PCI 25 years ago, ischemic cardiomyopathy (EF 35-40%), symptomatic bradycardia s/p Medtronic PPM, PAF (on Eliquis), HLD and h/o prostate CA. 08/2012  S/P permanent pace maker, and S/P PM generator removal and insertion of PPM.    Last cath 04/2008: 50% mid left main, 70-80% distal left main, 8--90% prox LCx, RCA occluded distally, L-R collaterals, SVG-OM occlued, IMA-LAD patent; PCI to RCA, OM and left main; LVEF 30-35%  TEE 06/26/12: LVEF 40-45%, inferoposterior and lateral AK, moderate Marcus, mod-severe LA dilatation; no evidence of thrombus or veg  In February of this year he was admitted with volume overload. Diuresed with IV lasix and transitioned to Torsmide 20 mg daily. Admit weight was 147 pounds. D/C weight 141 pounds. Continues to follow with Dr. Acie Fredrickson.  09/18/2012 Creatinine 1.63 Potassium 4.4 Uric Acid 12.6  PSA 9.3    Denies SOB/PND/Orthopnea/CP.  2 weeks ago he says he was out of torsemide and he had increased dyspnea. He has since restarted torsemide 20 mg daily. Weight at home 145-146 pounds. He is able to mow his grass without difficulty . He says is only taking eliquis 5 mg daily not twice a day due to spots black spots on his arm. No overt bleeding.   ROS: All systems negative except as listed in HPI, PMH and Problem List.  Past Medical History  Diagnosis Date  . Coronary artery disease     a. s/p remote CABG x 2(VG->OM, LIMA->LAD;  b. Late 90's s/p PCI of RCA;  c. 12/09 Cath/PCI: LM 66m, 70-80d (3.0x26mm Xience DES), LAD100p, LCX 80-90p (2.25x46mmTaxus Atom DES), RCA 100d (2.5x17mm Xience DES), VG->OM 100, LIMA->LAD nl, EF 30-35%.  . Hyperlipidemia   . Prostate cancer   . PAF (paroxysmal atrial fibrillation)   . Ischemic cardiomyopathy      a. EF 35-40% by echo 05/2012  . Symptomatic bradycardia     a. 08/2003 s/p MDT Enpulse E2DR01 Dual chamber PPM ser # [id number] H.  . Systolic CHF     Current Outpatient Prescriptions  Medication Sig Dispense Refill  . apixaban (ELIQUIS) 5 MG TABS tablet Take 0.5 tablets (2.5 mg total) by mouth 2 (two) times daily. Pt states he takes 1 tablet daily.  15 tablet  6  . Ascorbic Acid (VITAMIN C PO) Take 1 tablet by mouth daily.       Marland Kitchen aspirin EC 81 MG tablet Take 81 mg by mouth daily.      Marland Kitchen CALCIUM PO Take 1 tablet by mouth daily.       . carvedilol (COREG) 3.125 MG tablet Take 3.125 mg by mouth 2 (two) times daily with a meal.      . Cholecalciferol (VITAMIN D PO) Take 1 capsule by mouth daily.       . Coenzyme Q10 (COQ10 PO) Take 1 tablet by mouth daily.       . fish oil-omega-3 fatty acids 1000 MG capsule Take 1 g by mouth daily.       Marland Kitchen KRILL OIL PO Take by mouth daily.        Marland Kitchen LYCOPENE PO Take 1 tablet by mouth daily.       . niacin (NIASPAN) 1000 MG CR  tablet Take 2,000 mg by mouth at bedtime.      . potassium chloride SA (K-DUR,KLOR-CON) 20 MEQ tablet Take 20 mEq by mouth daily.       . Selenium 200 MCG CAPS Take 1 capsule by mouth daily.       Marland Kitchen torsemide (DEMADEX) 20 MG tablet Take 1 tablet (20 mg total) by mouth daily.  30 tablet  3  . VITAMIN E PO Take 1 capsule by mouth daily.        No current facility-administered medications for this encounter.     PHYSICAL EXAM: Filed Vitals:   11/20/12 1126  BP: 99/60  Pulse: 88  Weight: 149 lb 1.9 oz (67.64 kg)  SpO2: 100%    General:  Elderly Well appearing. No resp difficulty HEENT: normal Neck: supple. JVP5-6. Carotids 2+ bilaterally; no bruits. No lymphadenopathy or thryomegaly appreciated. Cor: PMI normal. Irregular rate & rhythm. No rubs, gallops or murmurs. Lungs: clear Abdomen: soft, nontender, nondistended. No hepatosplenomegaly. No bruits or masses. Good bowel sounds. Extremities: no cyanosis, clubbing, rash,  edema Neuro: alert & orientedx3, cranial nerves grossly intact. Moves all 4 extremities w/o difficulty. Affect pleasant.      ASSESSMENT & PLAN:

## 2012-11-20 NOTE — Assessment & Plan Note (Signed)
EF only mildly down. NYHA I-II. Volume status much improved on demadex. Unable to titrate meds or add ACE-I due to low BP. Reinforced need for daily weights and reviewed use of sliding scale diuretics. F/u Dr. Acie Fredrickson. We can see him back as needed.

## 2012-11-26 ENCOUNTER — Telehealth: Payer: Self-pay | Admitting: *Deleted

## 2012-11-26 MED ORDER — APIXABAN 5 MG PO TABS: 2.5000 mg | ORAL_TABLET | Freq: Two times a day (BID) | ORAL | Status: DC

## 2012-11-26 NOTE — Telephone Encounter (Signed)
Spoke with pharmacist for eliquis clarification, per ov note from visit to Dr Jeffie Pollock. Pt had his med changed to be a split dose= 2.5 mg bid per note. Pharmacy updated. Pt was left a msg to update him.

## 2012-12-02 ENCOUNTER — Encounter: Payer: Self-pay | Admitting: Internal Medicine

## 2012-12-02 ENCOUNTER — Ambulatory Visit (INDEPENDENT_AMBULATORY_CARE_PROVIDER_SITE_OTHER): Payer: Medicare Other | Admitting: Internal Medicine

## 2012-12-02 VITALS — BP 112/67 | HR 82 | Ht 68.0 in | Wt 147.8 lb

## 2012-12-02 DIAGNOSIS — I509 Heart failure, unspecified: Secondary | ICD-10-CM

## 2012-12-02 DIAGNOSIS — I495 Sick sinus syndrome: Secondary | ICD-10-CM | POA: Diagnosis not present

## 2012-12-02 DIAGNOSIS — Z950 Presence of cardiac pacemaker: Secondary | ICD-10-CM

## 2012-12-02 DIAGNOSIS — I5023 Acute on chronic systolic (congestive) heart failure: Secondary | ICD-10-CM

## 2012-12-02 DIAGNOSIS — I4892 Unspecified atrial flutter: Secondary | ICD-10-CM | POA: Diagnosis not present

## 2012-12-02 DIAGNOSIS — I4891 Unspecified atrial fibrillation: Secondary | ICD-10-CM

## 2012-12-02 LAB — PACEMAKER DEVICE OBSERVATION
AL AMPLITUDE: 1.4 mv
BAMS-0001: 150 {beats}/min
BRDY-0002RV: 60 {beats}/min
RV LEAD AMPLITUDE: 15.67 mv
RV LEAD IMPEDENCE PM: 421 Ohm
RV LEAD THRESHOLD: 0.5 V
VENTRICULAR PACING PM: 92

## 2012-12-02 NOTE — Assessment & Plan Note (Signed)
His Medtronic dual-chamber pacemaker is working normally. We'll plan to recheck in several months.

## 2012-12-02 NOTE — Progress Notes (Signed)
HPI Mr. Masse returns today for followup. He is a pleasant 79 yo man with a h/o symptomatic bradycardia, status post pacemaker insertion, chronic systolic heart failure, ejection fraction 40%, hypertension, and dyslipidemia. In the interim, he reports that his shortness of breath has improved with initiation of torsemide. The patient has also reduced his dose of anticoagulation from 5 mg twice daily down to 2.5 mg twice daily. He was been bothered by easy bruisability, and this is improved. He denies syncope. No Known Allergies   Current Outpatient Prescriptions  Medication Sig Dispense Refill  . apixaban (ELIQUIS) 5 MG TABS tablet Take 0.5 tablets (2.5 mg total) by mouth 2 (two) times daily.  30 tablet  6  . Ascorbic Acid (VITAMIN C PO) Take 1 tablet by mouth daily.       Marland Kitchen aspirin EC 81 MG tablet Take 81 mg by mouth daily.      Marland Kitchen CALCIUM PO Take 1 tablet by mouth daily.       . carvedilol (COREG) 3.125 MG tablet Take 3.125 mg by mouth 2 (two) times daily with a meal.      . Cholecalciferol (VITAMIN D PO) Take 1 capsule by mouth daily.       . Coenzyme Q10 (COQ10 PO) Take 1 tablet by mouth daily.       . fish oil-omega-3 fatty acids 1000 MG capsule Take 1 g by mouth daily.       Marland Kitchen KRILL OIL PO Take by mouth daily.        Marland Kitchen LYCOPENE PO Take 1 tablet by mouth daily.       . niacin (NIASPAN) 1000 MG CR tablet Take 2,000 mg by mouth at bedtime.      . potassium chloride SA (K-DUR,KLOR-CON) 20 MEQ tablet Take 20 mEq by mouth daily.       . Selenium 200 MCG CAPS Take 1 capsule by mouth daily.       Marland Kitchen torsemide (DEMADEX) 20 MG tablet Take 1 tablet (20 mg total) by mouth daily.  30 tablet  3  . VITAMIN E PO Take 1 capsule by mouth daily.        No current facility-administered medications for this visit.     Past Medical History  Diagnosis Date  . Coronary artery disease     a. s/p remote CABG x 2(VG->OM, LIMA->LAD;  b. Late 90's s/p PCI of RCA;  c. 12/09 Cath/PCI: LM 2m, 70-80d (3.0x64mm  Xience DES), LAD100p, LCX 80-90p (2.25x71mmTaxus Atom DES), RCA 100d (2.5x2mm Xience DES), VG->OM 100, LIMA->LAD nl, EF 30-35%.  . Hyperlipidemia   . Prostate cancer   . PAF (paroxysmal atrial fibrillation)   . Ischemic cardiomyopathy     a. EF 35-40% by echo 05/2012  . Symptomatic bradycardia     a. 08/2003 s/p MDT Enpulse E2DR01 Dual chamber PPM ser # [id number] H.  . Systolic CHF     ROS:   All systems reviewed and negative except as noted in the HPI.   Past Surgical History  Procedure Laterality Date  . Coronary artery bypass graft    . Insert / replace / remove pacemaker    . Doppler echocardiography  06/24/2002    EF 60-65%  . Tee without cardioversion  06/26/2012    Procedure: TRANSESOPHAGEAL ECHOCARDIOGRAM (TEE);  Surgeon: Lelon Perla, MD;  Location: [facility];  Service: Cardiovascular;  Laterality: [name];  . Cardioversion  06/26/2012    Procedure: CARDIOVERSION;  Surgeon: Lelon Perla, MD;  Location:  [facility] ENDOSCOPY;  Service: Cardiovascular;  Laterality: [name];  . Appendectomy    . Angioplasty      stent placement     Family History  Problem Relation Age of Onset  . Hypertension Mother   . Colon cancer Mother   . Alcohol abuse Father      History   Social History  . Marital Status: Married    Spouse Name: [name]    Number of Children: 0  . Years of Education: [name]   Occupational History  . retired    Social History Main Topics  . Smoking status: Former Smoker    Quit date: 02/10/1971  . Smokeless tobacco: Never Used  . Alcohol Use: No  . Drug Use: No  . Sexually Active: No   Other Topics Concern  . Not on file   Social History Narrative   Lives in [place] with wife.  Retired.     BP 112/67  Pulse 82  Ht 5\' 8"  (1.727 m)  Wt 147 lb 12.8 oz (67.042 kg)  BMI 22.48 kg/m2  Physical Exam:  Well appearing elderly man,NAD HEENT: Unremarkable Neck:  No JVD, no thyromegally Lungs:  Clear with no wheezes, rales, or rhonchi. HEART:  Regular rate rhythm,  no murmurs, no rubs, no clicks Abd:  soft, positive bowel sounds, no organomegally, no rebound, no guarding Ext:  2 plus pulses, no edema, no cyanosis, no clubbing Skin:  No rashes no nodules Neuro:  CN II through XII intact, motor grossly intact  DEVICE  Normal device function.  See PaceArt for details.   Assess/Plan:

## 2012-12-02 NOTE — Patient Instructions (Addendum)
Your physician wants you to follow-up in: April 2015 with Dr Knox Saliva will receive a reminder letter in the mail two months in advance. If you don't receive a letter, please call our office to schedule the follow-up appointment.   Remote monitoring is used to monitor your Pacemaker or ICD from home. This monitoring reduces the number of office visits required to check your device to one time per year. It allows [place] to keep an eye on the functioning of your device to ensure it is working properly. You are scheduled for a device check from home on 03/09/13. You may send your transmission at any time that day. If you have a wireless device, the transmission will be sent automatically. After your physician reviews your transmission, you will receive a postcard with your next transmission date.

## 2012-12-02 NOTE — Assessment & Plan Note (Signed)
His symptoms appear to be well compensated.he will continue taking torsemide along with maintaining a low-sodium diet.

## 2012-12-02 NOTE — Assessment & Plan Note (Signed)
His ventricular rate is well controlled. He will continue his current medical therapy.

## 2013-02-02 ENCOUNTER — Encounter: Payer: Self-pay | Admitting: Cardiovascular Disease

## 2013-02-02 ENCOUNTER — Ambulatory Visit (INDEPENDENT_AMBULATORY_CARE_PROVIDER_SITE_OTHER): Payer: Medicare Other | Admitting: Cardiovascular Disease

## 2013-02-02 ENCOUNTER — Other Ambulatory Visit: Payer: Self-pay | Admitting: *Deleted

## 2013-02-02 VITALS — BP 112/80 | HR 82 | Ht 68.0 in | Wt 149.8 lb

## 2013-02-02 DIAGNOSIS — I2510 Atherosclerotic heart disease of native coronary artery without angina pectoris: Secondary | ICD-10-CM

## 2013-02-02 DIAGNOSIS — I10 Essential (primary) hypertension: Secondary | ICD-10-CM

## 2013-02-02 DIAGNOSIS — I509 Heart failure, unspecified: Secondary | ICD-10-CM

## 2013-02-02 DIAGNOSIS — I4891 Unspecified atrial fibrillation: Secondary | ICD-10-CM

## 2013-02-02 DIAGNOSIS — E785 Hyperlipidemia, unspecified: Secondary | ICD-10-CM | POA: Diagnosis not present

## 2013-02-02 DIAGNOSIS — I5023 Acute on chronic systolic (congestive) heart failure: Secondary | ICD-10-CM

## 2013-02-02 MED ORDER — CARVEDILOL 3.125 MG PO TABS: 3.1250 mg | ORAL_TABLET | Freq: Two times a day (BID) | ORAL | Status: DC

## 2013-02-02 NOTE — Patient Instructions (Addendum)
Your physician recommends that you return for lab work in: today bmet lipid liver  Your physician recommends that you schedule a follow-up appointment in: 3 months w/ ekg and bmet

## 2013-02-02 NOTE — Progress Notes (Signed)
Marcus [name] Date of Birth: [birth year] Medical Record [id number]  Problems: 1. Coronary artery disease- status post CABG and PCI 2. Pacemaker placement 3. History of prostate cancer 4. Ischemic cardiopathy with an ejection fraction of 35-40% 5. Atrial fibrillation  History of Present Illness: Marcus [name] is seen back today for a work in visit . He has an ischemic CM with an EF of 35 to 40% per recent echo, known CAD remote CABG/PCI - managed medically, HLD, prostate cancer, underlying pacemaker due to bradycardia.   He was most recently admitted with CHF and cough, fever. Treated with diuretics and antibiotics. When I saw him back I got his pacemaker checked. He was in atrial flutter and the dates correlated with his recent admission. This may have been the trigger.  I started Eliquis. He was quite unsure as to taking it.   He comes back today. He is here with his wife. Feels "crummy" per the wife. He is tired. Not really short of breath. Cough has almost resolved. No chest pain. No swelling. Weight has been fairly stable at home. No extra lasix. Trying to watch his salt. Not dizzy or lightheaded. He says he is taking his medicines.   Feb. 5, 2014: He has had more "bad" days than "good" days.  He has been having some worsening dyspnea recently.  He denies any PND or orthopnea.  He is short of breath at rest - worsens with any exertion.   These symptoms have been gradually and progressively worsening over the past week or so.  July 22, 2012:   Marcus [name] was admitted with worsening dysnea recent.  He had a cardioversion.   TEE revealed: Left ventricle: Systolic function was mildly to moderately reduced. The estimated ejection fraction was in the range of 40% to 45%. Inferoposterior and lateral akinesis. - Aortic valve: No evidence of vegetation. Trivial regurgitation. - Mitral valve: No evidence of vegetation. Moderate regurgitation. - Left atrium: The atrium was moderately to  severely dilated. No evidence of thrombus in the atrial cavity or appendage. - Atrial septum: No defect or patent foramen ovale was identified. - Tricuspid valve: No evidence of vegetation. - Pulmonic valve: No evidence of vegetation.  His x-ray feeling quite well today. A TEE performed reveals some improvement of his left ventricular systolic function.  His breathing has improved on the Torsemide ( instead of Furosemide).    Sept. 15, 2014,   Marcus [name] is doing well.  No CP or dyspnea.   He has been doing on torsemide and is doing well.  He is due to a lipid profile.  He   Does not want to take the  20 mg torsemide every day.  He would like to take take 10 mg a day instead of 20 .  He has had mild renal insufficiency in the past and we have not use ACE-I or ARB.  His renal function had improved but now his creatinine is again 1.6.    Current Outpatient Prescriptions on File Prior to Visit  Medication Sig Dispense Refill  . apixaban (ELIQUIS) 5 MG TABS tablet Take 0.5 tablets (2.5 mg total) by mouth 2 (two) times daily.  30 tablet  6  . Ascorbic Acid (VITAMIN C PO) Take 1 tablet by mouth daily.       Marland Kitchen aspirin EC 81 MG tablet Take 81 mg by mouth daily.      Marland Kitchen CALCIUM PO Take 1 tablet by mouth daily.       Marland Kitchen  Cholecalciferol (VITAMIN D PO) Take 1 capsule by mouth daily.       . Coenzyme Q10 (COQ10 PO) Take 1 tablet by mouth daily.       . fish oil-omega-3 fatty acids 1000 MG capsule Take 1 g by mouth daily.       Marland Kitchen KRILL OIL PO Take by mouth daily.        Marland Kitchen LYCOPENE PO Take 1 tablet by mouth daily.       . niacin (NIASPAN) 1000 MG CR tablet Take 2,000 mg by mouth at bedtime.      . potassium chloride SA (K-DUR,KLOR-CON) 20 MEQ tablet Take 20 mEq by mouth daily.       . Selenium 200 MCG CAPS Take 1 capsule by mouth daily.       Marland Kitchen torsemide (DEMADEX) 20 MG tablet Take 1 tablet (20 mg total) by mouth daily.  30 tablet  3  . VITAMIN E PO Take 1 capsule by mouth daily.        No current  facility-administered medications on file prior to visit.    No Known Allergies  Past Medical History  Diagnosis Date  . Coronary artery disease     a. s/p remote CABG x 2(VG->OM, LIMA->LAD;  b. Late 90's s/p PCI of RCA;  c. 12/09 Cath/PCI: LM 57m, 70-80d (3.0x42mm Xience DES), LAD100p, LCX 80-90p (2.25x74mmTaxus Atom DES), RCA 100d (2.5x77mm Xience DES), VG->OM 100, LIMA->LAD nl, EF 30-35%.  . Hyperlipidemia   . Prostate cancer   . PAF (paroxysmal atrial fibrillation)   . Ischemic cardiomyopathy     a. EF 35-40% by echo 05/2012  . Symptomatic bradycardia     a. 08/2003 s/p MDT Enpulse E2DR01 Dual chamber PPM ser # [id number] H.  . Systolic CHF     Past Surgical History  Procedure Laterality Date  . Coronary artery bypass graft    . Insert / replace / remove pacemaker    . Doppler echocardiography  06/24/2002    EF 60-65%  . Tee without cardioversion  06/26/2012    Procedure: TRANSESOPHAGEAL ECHOCARDIOGRAM (TEE);  Surgeon: Lelon Perla, MD;  Location: [facility] ENDOSCOPY;  Service: Cardiovascular;  Laterality: [name];  . Cardioversion  06/26/2012    Procedure: CARDIOVERSION;  Surgeon: Lelon Perla, MD;  Location: [place];  Service: Cardiovascular;  Laterality: [name];  . Appendectomy    . Angioplasty      stent placement    History  Smoking status  . Former Smoker  . Quit date: 02/10/1971  Smokeless tobacco  . Never Used    History  Alcohol Use No    Family History  Problem Relation Age of Onset  . Hypertension Mother   . Colon cancer Mother   . Alcohol abuse Father     Review of Systems: The review of systems is per the HPI.  All other systems were reviewed and are negative.  Physical Exam: BP 112/80  Pulse 82  Ht 5\' 8"  (1.727 m)  Wt 149 lb 12.8 oz (67.949 kg)  BMI 22.78 kg/m2  SpO2 98% Patient is alert, looks chronically ill but in no acute distress. Skin is warm and dry. Color is normal.   HEENT is unremarkable. Normocephalic/atraumatic. PERRL. Sclera are  nonicteric. Neck is supple. No masses. No JVD.  Lungs are clear.  Cardiac exam shows a regular rhythm.  He has occasional premature beats. . Rate is controlled.  Abdomen is soft.  Extremities are without edema.  Gait and ROM are intact.  No gross neurologic deficits noted.   LABORATORY DATA: Lab Results  Component Value Date   WBC 5.2 08/21/2012   HGB 10.3* 08/21/2012   HCT 31.5* 08/21/2012   PLT 103.0* 08/21/2012   GLUCOSE 121* 08/21/2012   CHOL 175 02/28/2011   TRIG 37.0 02/28/2011   HDL 77.40 02/28/2011   LDLCALC 90 02/28/2011   ALT 16 06/25/2012   AST 22 06/25/2012   NA 134* 08/21/2012   K 3.6 08/21/2012   CL 98 08/21/2012   CREATININE 1.6* 08/21/2012   BUN 52* 08/21/2012   CO2 29 08/21/2012   TSH 0.736 05/24/2012   INR 1.47 06/28/2012   EKG:  Assessment / Plan:

## 2013-02-02 NOTE — Assessment & Plan Note (Addendum)
Marcus [name] has moderate- severe systolic CHF.  We have not started an ACE-inhibitor due to episodes of renal insufficiency and hypotension.   Today his Bp is better but his last creatinine is 1.6.  Will recheck his renal function and will try to start him on an ACE or ARB if possible.   He would like to lower the does of his Torsemide on occasion.  I explained that he may be better off finding an acceptable dose of Torsemide that he can take daily since that will be the most consistent way to control his CHF.

## 2013-02-02 NOTE — Assessment & Plan Note (Signed)
His BP is well controlled at this point.

## 2013-02-02 NOTE — Assessment & Plan Note (Signed)
Will check lipds, liver enzymes, and BMP today.

## 2013-02-02 NOTE — Assessment & Plan Note (Signed)
Continue with the Eliqis and other meds.  He has a pacer - rate is regular   There is a notice in Epic that he is not on an anticoagulant but in fact, he is documented to be on Eliquis.  I suspect that Epic does not recognize Eliquis as a NOAC.

## 2013-02-02 NOTE — Assessment & Plan Note (Signed)
Stable, no angina

## 2013-02-03 LAB — BASIC METABOLIC PANEL
BUN: 34 mg/dL — ABNORMAL HIGH (ref 6–23)
CO2: 31 mEq/L (ref 19–32)
Calcium: 9.7 mg/dL (ref 8.4–10.5)
Chloride: 104 mEq/L (ref 96–112)
Creatinine, Ser: 1.8 mg/dL — ABNORMAL HIGH (ref 0.4–1.5)
Glucose, Bld: 97 mg/dL (ref 70–99)

## 2013-02-03 LAB — LIPID PANEL
LDL Cholesterol: 77 mg/dL (ref 0–99)
Total CHOL/HDL Ratio: 2

## 2013-02-03 LAB — HEPATIC FUNCTION PANEL
AST: 30 U/L (ref 0–37)
Albumin: 4 g/dL (ref 3.5–5.2)
Alkaline Phosphatase: 33 U/L — ABNORMAL LOW (ref 39–117)
Total Bilirubin: 0.8 mg/dL (ref 0.3–1.2)

## 2013-02-13 NOTE — H&P (View-Only) (Signed)
HPI Marcus [name] returns today for followup. He is an 79 year old man with a history of symptomatic bradycardia, status post permanent pacemaker insertion. His pacemaker reached elective replacement over a month ago. In the interim, he has done well. He denies chest pain or shortness of breath. He has mild peripheral edema. No syncope. No Known Allergies   Current Outpatient Prescriptions  Medication Sig Dispense Refill  . apixaban (ELIQUIS) 5 MG TABS tablet Take 5 mg by mouth 2 (two) times daily.      . Ascorbic Acid (VITAMIN C PO) Take 1 tablet by mouth daily.       Marland Kitchen aspirin 81 MG tablet Take 81 mg by mouth daily.      Marland Kitchen CALCIUM PO Take 1 tablet by mouth daily.       . carvedilol (COREG) 3.125 MG tablet Take 3.125 mg by mouth 2 (two) times daily with a meal.      . Cholecalciferol (VITAMIN D PO) Take 1 capsule by mouth daily.       . Coenzyme Q10 (COQ10 PO) Take 1 tablet by mouth daily.       . fish oil-omega-3 fatty acids 1000 MG capsule Take 1 g by mouth daily.       Marland Kitchen KRILL OIL PO Take by mouth daily.        Marland Kitchen LYCOPENE PO Take 1 tablet by mouth daily.       . niacin (NIASPAN) 500 MG CR tablet Take 2,000 mg by mouth at bedtime.        . potassium chloride SA (K-DUR,KLOR-CON) 20 MEQ tablet Take 20 mEq by mouth daily.       . Selenium 200 MCG CAPS Take by mouth daily.        Marland Kitchen torsemide (DEMADEX) 20 MG tablet Take 1 tablet (20 mg total) by mouth daily.  30 tablet  3  . VITAMIN E PO Take 1 capsule by mouth daily.        No current facility-administered medications for this visit.     Past Medical History  Diagnosis Date  . Coronary artery disease     a. s/p remote CABG x 2(VG->OM, LIMA->LAD;  b. Late 90's s/p PCI of RCA;  c. 12/09 Cath/PCI: LM 25m, 70-80d (3.0x40mm Xience DES), LAD100p, LCX 80-90p (2.25x75mmTaxus Atom DES), RCA 100d (2.5x26mm Xience DES), VG->OM 100, LIMA->LAD nl, EF 30-35%.  . Hyperlipidemia   . Prostate cancer   . PAF (paroxysmal atrial fibrillation)   .  Ischemic cardiomyopathy     a. EF 35-40% by echo 05/2012  . Symptomatic bradycardia     a. 08/2003 s/p MDT Enpulse E2DR01 Dual chamber PPM ser # [id number] H.  . Systolic CHF     ROS:   All systems reviewed and negative except as noted in the HPI.   Past Surgical History  Procedure Laterality Date  . Coronary artery bypass graft    . Insert / replace / remove pacemaker    . Doppler echocardiography  06/24/2002    EF 60-65%  . Tee without cardioversion  06/26/2012    Procedure: TRANSESOPHAGEAL ECHOCARDIOGRAM (TEE);  Surgeon: Lelon Perla, MD;  Location: [facility] ENDOSCOPY;  Service: Cardiovascular;  Laterality: [name];  . Cardioversion  06/26/2012    Procedure: CARDIOVERSION;  Surgeon: Lelon Perla, MD;  Location: [facility] ENDOSCOPY;  Service: Cardiovascular;  Laterality: [name];     Family History  Problem Relation Age of Onset  . Hypertension Mother   . Cancer Mother   . Alcohol  abuse Father      History   Social History  . Marital Status: Married    Spouse Name: [name]    Number of Children: [name]  . Years of Education: [name]   Occupational History  . Not on file.   Social History Main Topics  . Smoking status: Former Smoker    Quit date: 02/10/1971  . Smokeless tobacco: Not on file  . Alcohol Use: No  . Drug Use: No  . Sexually Active: No   Other Topics Concern  . Not on file   Social History Narrative   Lives in [place] with wife.  Retired.     BP 111/60  Pulse 68  Ht 5\' 10"  (1.778 m)  Wt 151 lb 6.4 oz (68.675 kg)  BMI 21.72 kg/m2  Physical Exam:  Well appearing 79 year old man,NAD HEENT: Unremarkable Neck:  7 cm JVD, no thyromegally Lungs:  Clear with no wheezes, rales, or rhonchi. Well-healed pacemaker incision. HEART:  Regular rate rhythm, no murmurs, no rubs, no clicks Abd:  soft, positive bowel sounds, no organomegally, no rebound, no guarding Ext:  2 plus pulses, no edema, no cyanosis, no clubbing Skin:  No rashes no nodules Neuro:  CN II through XII intact,  motor grossly intact  DEVICE  Normal device function.  See PaceArt for details.   Assess/Plan:

## 2013-02-13 NOTE — Interval H&P Note (Signed)
History and Physical Interval Note: The patient is seen and examined. He has symptomatic sinus node dysfunction and sick sinus syndrome and is at ERI/EOL and presents today for PPM removal and insertion of a new device.  08/27/2012 Marcus [name]  has presented today for surgery, with the diagnosis of End of life  The various methods of treatment have been discussed with the patient and family. After consideration of risks, benefits and other options for treatment, the patient has consented to  Procedure(s): PERMANENT PACEMAKER GENERATOR CHANGE ([name]) as a surgical intervention .  The patient's history has been reviewed, patient examined, no change in status, stable for surgery.  I have reviewed the patient's chart and labs.  Questions were answered to the patient's satisfaction.     Cristopher Peru

## 2013-02-17 ENCOUNTER — Other Ambulatory Visit: Payer: Self-pay

## 2013-02-17 MED ORDER — POTASSIUM CHLORIDE CRYS ER 20 MEQ PO TBCR: 20.0000 meq | EXTENDED_RELEASE_TABLET | Freq: Every day | ORAL | Status: DC

## 2013-03-03 ENCOUNTER — Other Ambulatory Visit: Payer: Self-pay | Admitting: Cardiovascular Disease

## 2013-03-09 ENCOUNTER — Encounter: Payer: Self-pay | Admitting: Internal Medicine

## 2013-03-09 ENCOUNTER — Ambulatory Visit (INDEPENDENT_AMBULATORY_CARE_PROVIDER_SITE_OTHER): Payer: Medicare Other | Admitting: *Deleted

## 2013-03-09 DIAGNOSIS — Z950 Presence of cardiac pacemaker: Secondary | ICD-10-CM | POA: Diagnosis not present

## 2013-03-09 DIAGNOSIS — I495 Sick sinus syndrome: Secondary | ICD-10-CM

## 2013-03-16 ENCOUNTER — Telehealth: Payer: Self-pay | Admitting: Cardiovascular Disease

## 2013-03-16 NOTE — Telephone Encounter (Signed)
Spoke with pts wife - she reports that pt is having a increased difficulty with SOB with limited exertion.  This has increased over the past few days.  She also reports at night he is having "haulted breathing."   As long as 30 seconds.  Pt has not been weighing but wife reports he does have some edema at his ankles.  She states these s/s are much like those he had before when he was going into CHF.  He is taking Torsemide 20 mg a day.  He was given an appointment for 10/28 with Truitt Merle at [facility]

## 2013-03-16 NOTE — Telephone Encounter (Signed)
New problem  Patients wife called in that patient was having SOB, tightness in the chest and weakness. I sent call to [facility] in Triage.

## 2013-03-17 ENCOUNTER — Encounter: Payer: Self-pay | Admitting: Nurse Practitioner

## 2013-03-17 ENCOUNTER — Ambulatory Visit (INDEPENDENT_AMBULATORY_CARE_PROVIDER_SITE_OTHER): Payer: Medicare Other | Admitting: Nurse Practitioner

## 2013-03-17 VITALS — BP 130/78 | HR 74 | Ht 68.0 in | Wt 157.8 lb

## 2013-03-17 DIAGNOSIS — R0989 Other specified symptoms and signs involving the circulatory and respiratory systems: Secondary | ICD-10-CM | POA: Diagnosis not present

## 2013-03-17 DIAGNOSIS — R0600 Dyspnea, unspecified: Secondary | ICD-10-CM

## 2013-03-17 DIAGNOSIS — R0609 Other forms of dyspnea: Secondary | ICD-10-CM

## 2013-03-17 DIAGNOSIS — I4891 Unspecified atrial fibrillation: Secondary | ICD-10-CM | POA: Diagnosis not present

## 2013-03-17 LAB — BASIC METABOLIC PANEL
BUN: 35 mg/dL — ABNORMAL HIGH (ref 6–23)
CO2: 28 mEq/L (ref 19–32)
Calcium: 9.3 mg/dL (ref 8.4–10.5)
Chloride: 104 mEq/L (ref 96–112)
Creatinine, Ser: 1.7 mg/dL — ABNORMAL HIGH (ref 0.4–1.5)
GFR: 40.12 mL/min — ABNORMAL LOW (ref >60.00)
Glucose, Bld: 92 mg/dL (ref 70–99)
Potassium: 4 mEq/L (ref 3.5–5.1)
Sodium: 141 mEq/L (ref 135–145)

## 2013-03-17 LAB — CBC WITH DIFFERENTIAL/PLATELET
Basophils Absolute: 0 10*3/uL (ref 0.0–0.1)
Basophils Relative: 0.6 % (ref 0.0–3.0)
Eosinophils Absolute: 0.1 10*3/uL (ref 0.0–0.7)
Eosinophils Relative: 3 % (ref 0.0–5.0)
HCT: 32 % — ABNORMAL LOW (ref 39.0–52.0)
Hemoglobin: 10.7 g/dL — ABNORMAL LOW (ref 13.0–17.0)
Lymphocytes Relative: 19 % (ref 12.0–46.0)
Lymphs Abs: 0.8 10*3/uL (ref 0.7–4.0)
MCHC: 33.3 g/dL (ref 30.0–36.0)
MCV: 87.2 fl (ref 78.0–100.0)
Monocytes Absolute: 0.4 10*3/uL (ref 0.1–1.0)
Monocytes Relative: 10.5 % (ref 3.0–12.0)
Neutro Abs: 2.7 10*3/uL (ref 1.4–7.7)
Neutrophils Relative %: 66.9 % (ref 43.0–77.0)
Platelets: 148 10*3/uL — ABNORMAL LOW (ref 150.0–400.0)
RBC: 3.67 Mil/uL — ABNORMAL LOW (ref 4.22–5.81)
RDW: 17.1 % — ABNORMAL HIGH (ref 11.5–14.6)
WBC: 4.1 10*3/uL — ABNORMAL LOW (ref 4.5–10.5)

## 2013-03-17 LAB — BRAIN NATRIURETIC PEPTIDE: Pro B Natriuretic peptide (BNP): 1207 pg/mL — ABNORMAL HIGH (ref 0.0–100.0)

## 2013-03-17 MED ORDER — APIXABAN 2.5 MG PO TABS: 2.5000 mg | ORAL_TABLET | Freq: Two times a day (BID) | ORAL | Status: DC

## 2013-03-17 NOTE — Patient Instructions (Addendum)
Increase your Torsemide to 2 pills a day for the next week  We need to restart your Eliquis at 2.5 mg two times a day  We will check labs today  See Dr. Acie Fredrickson in a month  Call the [place] office at [phone] if you have any questions, problems or concerns.

## 2013-03-17 NOTE — Progress Notes (Signed)
Marcus [name] Date of Birth: 12-20-1928 Medical Record [id number]  History of Present Illness:  Marcus [name] is seen back today for a work in visit. Seen for Marcus [name]. He has multiple issues which include an ischemic CM with an EF of 35 to 40%, known CAD with remote CABG/PCI - managed medically, HLD, prostate cancer, underlying pacemaker due to bradycardia PAF - on Eliquis and has had cardioversion in 2014. Last EF was 40 to 45%.   Last seen here in September - seemed to be doing ok.   Comes back today. Called yesterday. Has been gaining weight, more short of breath, more swelling, feels bloated, some cough and having to prop up more at night. Wife notes some apnea at night. No chest pain. He stopped his Eliquis some time ago - said it made him bruise and he did not feel like he needed it. No palpitations and apparently has no awareness of his AF.   Current Outpatient Prescriptions  Medication Sig Dispense Refill  . Ascorbic Acid (VITAMIN C PO) Take 1 tablet by mouth daily.       Marland Kitchen aspirin EC 81 MG tablet Take 81 mg by mouth daily.      Marland Kitchen CALCIUM PO Take 1 tablet by mouth daily.       . carvedilol (COREG) 3.125 MG tablet Take 1 tablet (3.125 mg total) by mouth 2 (two) times daily with a meal.  60 tablet  3  . Cholecalciferol (VITAMIN D PO) Take 1 capsule by mouth daily.       . Coenzyme Q10 (COQ10 PO) Take 1 tablet by mouth daily.       . fish oil-omega-3 fatty acids 1000 MG capsule Take 1 g by mouth daily.       Marland Kitchen KRILL OIL PO Take by mouth daily.        Marland Kitchen LYCOPENE PO Take 1 tablet by mouth daily.       . niacin (NIASPAN) 1000 MG CR tablet Take 2,000 mg by mouth at bedtime.      . potassium chloride SA (K-DUR,KLOR-CON) 20 MEQ tablet Take 1 tablet (20 mEq total) by mouth daily.  90 tablet  2  . Selenium 200 MCG CAPS Take 1 capsule by mouth daily.       Marland Kitchen torsemide (DEMADEX) 20 MG tablet TAKE ONE TABLET BY MOUTH ONCE DAILY  30 tablet  1  . VITAMIN E PO Take 1 capsule by mouth  daily.        No current facility-administered medications for this visit.    No Known Allergies  Past Medical History  Diagnosis Date  . Coronary artery disease     a. s/p remote CABG x 2(VG->OM, LIMA->LAD;  b. Late 90's s/p PCI of RCA;  c. 12/09 Cath/PCI: LM 3m, 70-80d (3.0x78mm Xience DES), LAD100p, LCX 80-90p (2.25x68mmTaxus Atom DES), RCA 100d (2.5x21mm Xience DES), VG->OM 100, LIMA->LAD nl, EF 30-35%.  . Hyperlipidemia   . Prostate cancer   . PAF (paroxysmal atrial fibrillation)   . Ischemic cardiomyopathy     a. EF 35-40% by echo 05/2012  . Symptomatic bradycardia     a. 08/2003 s/p MDT Enpulse E2DR01 Dual chamber PPM ser # [id number] H.  . Systolic CHF     Past Surgical History  Procedure Laterality Date  . Coronary artery bypass graft    . Insert / replace / remove pacemaker    . Doppler echocardiography  06/24/2002    EF 60-65%  . Tee without  cardioversion  06/26/2012    Procedure: TRANSESOPHAGEAL ECHOCARDIOGRAM (TEE);  Surgeon: Marcus Perla, MD;  Location: [facility] ENDOSCOPY;  Service: Cardiovascular;  Laterality: [name];  . Cardioversion  06/26/2012    Procedure: CARDIOVERSION;  Surgeon: Marcus Perla, MD;  Location: [place];  Service: Cardiovascular;  Laterality: [name];  . Appendectomy    . Angioplasty      stent placement    History  Smoking status  . Former Smoker  . Quit date: 02/10/1971  Smokeless tobacco  . Never Used    History  Alcohol Use No    Family History  Problem Relation Age of Onset  . Hypertension Mother   . Colon cancer Mother   . Alcohol abuse Father     Review of Systems: The review of systems is per the HPI.  All other systems were reviewed and are negative.  Physical Exam: BP 130/78  Pulse 74  Ht 5\' 8"  (1.727 m)  Wt 157 lb 12.8 oz (71.578 kg)  BMI 24 kg/m2  SpO2 96% Patient is alert and in no acute distress. Looks chronically ill. Skin is warm and dry. Color is normal.  HEENT is unremarkable. Normocephalic/atraumatic. PERRL.  Sclera are nonicteric. Neck is supple. No masses. No JVD. Lungs are clear. Cardiac exam shows an irregular rhythm. Abdomen is distended but soft. Extremities are with 1+ edema. His weight is up 8 pounds since last visit. Gait and ROM are intact. No gross neurologic deficits noted.  LABORATORY DATA: EKG is paced.  Pacemaker check shows atrial fib.   Echo Study Conclusions from 06/2012  - Left ventricle: Systolic function was mildly to moderately reduced. The estimated ejection fraction was in the range of 40% to 45%. Inferoposterior and lateral akinesis. - Aortic valve: No evidence of vegetation. Trivial regurgitation. - Mitral valve: No evidence of vegetation. Moderate regurgitation. - Left atrium: The atrium was moderately to severely dilated. No evidence of thrombus in the atrial cavity or appendage. - Atrial septum: No defect or patent foramen ovale was identified. - Tricuspid valve: No evidence of vegetation. - Pulmonic valve: No evidence of vegetation.  Assessment / Plan: 1. PAF - recurrent - has had prior cardioversion back in February - looks to me like he has been back in AF since July - I have restarted his Eliquis at 2.5 mg BID (due to CRI) - I have explained to him the importance of continuing anticoagulation. Check labs today. See him in a month and consider cardioversion again versus just rate control and anticoagulation.   2. Ischemic CM - last EF of 40 to 45% - probably more decompensation given his AF - I have increased his Demedex to 2 pills a day for one week. Check labs today as well  See Marcus [name] back in a month.   Patient is agreeable to this plan and will call if any problems develop in the interim.   Marcus Junes, RN, [place] [address] [place], [place]  [id number]

## 2013-03-18 ENCOUNTER — Telehealth: Payer: Self-pay | Admitting: Nurse Practitioner

## 2013-03-18 ENCOUNTER — Other Ambulatory Visit: Payer: Self-pay | Admitting: *Deleted

## 2013-03-18 DIAGNOSIS — I5022 Chronic systolic (congestive) heart failure: Secondary | ICD-10-CM

## 2013-03-18 LAB — REMOTE PACEMAKER DEVICE
AL AMPLITUDE: 2.8 mv
AL THRESHOLD: 0.5 V
BAMS-0001: 150 {beats}/min
RV LEAD AMPLITUDE: 22.4 mv
RV LEAD THRESHOLD: 0.625 V

## 2013-03-18 NOTE — Telephone Encounter (Signed)
New message    Returning nurses call to give them lab results from yesterday

## 2013-03-24 ENCOUNTER — Other Ambulatory Visit (INDEPENDENT_AMBULATORY_CARE_PROVIDER_SITE_OTHER): Payer: Medicare Other

## 2013-03-24 DIAGNOSIS — I5022 Chronic systolic (congestive) heart failure: Secondary | ICD-10-CM | POA: Diagnosis not present

## 2013-03-24 DIAGNOSIS — D62 Acute posthemorrhagic anemia: Secondary | ICD-10-CM

## 2013-03-24 LAB — CBC WITH DIFFERENTIAL/PLATELET
Basophils Absolute: 0 10*3/uL (ref 0.0–0.1)
Basophils Relative: 0.7 % (ref 0.0–3.0)
Eosinophils Absolute: 0.2 10*3/uL (ref 0.0–0.7)
Eosinophils Relative: 4.9 % (ref 0.0–5.0)
HCT: 33.3 % — ABNORMAL LOW (ref 39.0–52.0)
Hemoglobin: 11 g/dL — ABNORMAL LOW (ref 13.0–17.0)
Lymphocytes Relative: 25.1 % (ref 12.0–46.0)
Lymphs Abs: 0.9 10*3/uL (ref 0.7–4.0)
MCHC: 32.9 g/dL (ref 30.0–36.0)
MCV: 87.8 fl (ref 78.0–100.0)
Monocytes Absolute: 0.4 10*3/uL (ref 0.1–1.0)
Monocytes Relative: 10.3 % (ref 3.0–12.0)
Neutro Abs: 2.2 10*3/uL (ref 1.4–7.7)
Neutrophils Relative %: 59 % (ref 43.0–77.0)
Platelets: 171 10*3/uL (ref 150.0–400.0)
RBC: 3.8 Mil/uL — ABNORMAL LOW (ref 4.22–5.81)
RDW: 17.2 % — ABNORMAL HIGH (ref 11.5–14.6)
WBC: 3.8 10*3/uL — ABNORMAL LOW (ref 4.5–10.5)

## 2013-03-24 LAB — BASIC METABOLIC PANEL
Chloride: 98 mEq/L (ref 96–112)
Creatinine, Ser: 1.7 mg/dL — ABNORMAL HIGH (ref 0.4–1.5)
Glucose, Bld: 102 mg/dL — ABNORMAL HIGH (ref 70–99)
Potassium: 4 mEq/L (ref 3.5–5.1)
Sodium: 139 mEq/L (ref 135–145)

## 2013-03-27 ENCOUNTER — Encounter: Payer: Self-pay | Admitting: *Deleted

## 2013-04-02 ENCOUNTER — Telehealth: Payer: Self-pay | Admitting: Cardiovascular Disease

## 2013-04-02 DIAGNOSIS — R0602 Shortness of breath: Secondary | ICD-10-CM

## 2013-04-02 DIAGNOSIS — I509 Heart failure, unspecified: Secondary | ICD-10-CM

## 2013-04-02 MED ORDER — TORSEMIDE 20 MG PO TABS: 40.0000 mg | ORAL_TABLET | Freq: Every day | ORAL | Status: DC

## 2013-04-02 NOTE — Telephone Encounter (Signed)
New message     Pt has CHF.  Heart out of rhythum---possible cardioversion in dec; gaining wt; sob--this am wt was 155 (149lbs 1wk ago)--Wife want to know if she should double up on medications. Been on 1 1/2 tablet of torrsemide since 11-9.

## 2013-04-02 NOTE — Telephone Encounter (Signed)
Wt up 4 lb / sob/ abd swelling/ don't feel well. Spoke with wife. Pt currently on torsemide 20 mg 1.5 tablets daily(30 mg). Pt told to take torsemide 30 mg BID x 3 days along with k+ 20 meq BID  Return for lab/ bmet 11/17/ app given. Pt had tried to reduce torsemide but feels he is best at 40 mg daily.  Starting 11/16 he will go to torsemide 40 mg daily k+20 meq daily.  They will call tomorrow if he doesn't start to feel better/ per Dr Acie Fredrickson he may need torsemide 40 mg bid for the 3 days but pt will call.

## 2013-04-06 ENCOUNTER — Other Ambulatory Visit (INDEPENDENT_AMBULATORY_CARE_PROVIDER_SITE_OTHER): Payer: Medicare Other

## 2013-04-06 DIAGNOSIS — R0602 Shortness of breath: Secondary | ICD-10-CM | POA: Diagnosis not present

## 2013-04-06 DIAGNOSIS — I509 Heart failure, unspecified: Secondary | ICD-10-CM

## 2013-04-06 LAB — BASIC METABOLIC PANEL
CO2: 30 mEq/L (ref 19–32)
Chloride: 101 mEq/L (ref 96–112)
Creatinine, Ser: 1.7 mg/dL — ABNORMAL HIGH (ref 0.4–1.5)
GFR: 41.49 mL/min — ABNORMAL LOW (ref >60.00)
Glucose, Bld: 103 mg/dL — ABNORMAL HIGH (ref 70–99)
Potassium: 3.9 mEq/L (ref 3.5–5.1)
Sodium: 140 mEq/L (ref 135–145)

## 2013-04-06 NOTE — Telephone Encounter (Signed)
Per wife, he is feeling better, labs today, he reduced his weight day 1 and 2 but now it is increasing again, they are to call later today or tomorrow to update with how he is feeling. Pt is watching sodium intake very carefully.

## 2013-04-07 NOTE — Telephone Encounter (Signed)
Spoke with patient and wife.  Labs were reviewed. pts weight up 2 lbs from dry weight but feeling well.  Told to call with changes.   Patient to continue with torsemide 40 mg daily and k+75meq daily.  Pt verbalized understanding.

## 2013-04-07 NOTE — Telephone Encounter (Signed)
Follow Up:  Pt's wife states she is returning Jodette's call. States she wants to know about husband's morning meds. What should he take?

## 2013-05-05 ENCOUNTER — Encounter (INDEPENDENT_AMBULATORY_CARE_PROVIDER_SITE_OTHER): Payer: Self-pay

## 2013-05-05 ENCOUNTER — Ambulatory Visit (INDEPENDENT_AMBULATORY_CARE_PROVIDER_SITE_OTHER): Payer: Medicare Other | Admitting: Cardiovascular Disease

## 2013-05-05 ENCOUNTER — Encounter: Payer: Self-pay | Admitting: Cardiovascular Disease

## 2013-05-05 VITALS — BP 110/78 | HR 82 | Ht 68.0 in | Wt 157.8 lb

## 2013-05-05 DIAGNOSIS — I2510 Atherosclerotic heart disease of native coronary artery without angina pectoris: Secondary | ICD-10-CM | POA: Diagnosis not present

## 2013-05-05 DIAGNOSIS — I509 Heart failure, unspecified: Secondary | ICD-10-CM

## 2013-05-05 DIAGNOSIS — I10 Essential (primary) hypertension: Secondary | ICD-10-CM | POA: Diagnosis not present

## 2013-05-05 DIAGNOSIS — I5023 Acute on chronic systolic (congestive) heart failure: Secondary | ICD-10-CM | POA: Diagnosis not present

## 2013-05-05 NOTE — Assessment & Plan Note (Signed)
Marcus [name] seems to be doing fairly well. He has tried low doses of torsemide but the lower doses did not achieve any diuresis. It has gradually increased over the past year but I think this degree of diuresis is needed given his chronic systolic congestive heart failure.    I will  see him again in 6 months for followup office visit, basic metabolic profile, liver enzymes, and lipid profile.

## 2013-05-05 NOTE — Progress Notes (Signed)
Marcus [name] Date of Birth: 1929/02/07 Medical Record [id number]  Problems: 1. Coronary artery disease- status post CABG and PCI 2. Pacemaker placement 3. History of prostate cancer 4. Ischemic cardiopathy with an ejection fraction of 35-40% 5. Atrial fibrillation  History of Present Illness: Marcus [name] is seen back today for a work in visit . He has an ischemic CM with an EF of 35 to 40% per recent echo, known CAD remote CABG/PCI - managed medically, HLD, prostate cancer, underlying pacemaker due to bradycardia.   He was most recently admitted with CHF and cough, fever. Treated with diuretics and antibiotics. When I saw him back I got his pacemaker checked. He was in atrial flutter and the dates correlated with his recent admission. This may have been the trigger.  I started Eliquis. He was quite unsure as to taking it.   He comes back today. He is here with his wife. Feels "crummy" per the wife. He is tired. Not really short of breath. Cough has almost resolved. No chest pain. No swelling. Weight has been fairly stable at home. No extra lasix. Trying to watch his salt. Not dizzy or lightheaded. He says he is taking his medicines.   Feb. 5, 2014: He has had more "bad" days than "good" days.  He has been having some worsening dyspnea recently.  He denies any PND or orthopnea.  He is short of breath at rest - worsens with any exertion.   These symptoms have been gradually and progressively worsening over the past week or so.  July 22, 2012:   Marcus [name] was admitted with worsening dysnea recent.  He had a cardioversion.   TEE revealed: Left ventricle: Systolic function was mildly to moderately reduced. The estimated ejection fraction was in the range of 40% to 45%. Inferoposterior and lateral akinesis. - Aortic valve: No evidence of vegetation. Trivial regurgitation. - Mitral valve: No evidence of vegetation. Moderate regurgitation. - Left atrium: The atrium was moderately to  severely dilated. No evidence of thrombus in the atrial cavity or appendage. - Atrial septum: No defect or patent foramen ovale was identified. - Tricuspid valve: No evidence of vegetation. - Pulmonic valve: No evidence of vegetation.  His x-ray feeling quite well today. A TEE performed reveals some improvement of his left ventricular systolic function.  His breathing has improved on the Torsemide ( instead of Furosemide).    Sept. 15, 2014,   Marcus [name] is doing well.  No CP or dyspnea.   He has been doing on torsemide and is doing well.  He is due to a lipid profile. He   Does not want to take the  20 mg torsemide every day.  He would like to take take 10 mg a day instead of 20 . He has had mild renal insufficiency in the past and we have not use ACE-I or ARB.  His renal function had improved but now his creatinine is again 1.6.   Dec. 16, 2014:  Marcus [name] is feeling well.  Breathing well.  No CP.   He is still taking   Current Outpatient Prescriptions on File Prior to Visit  Medication Sig Dispense Refill  . apixaban (ELIQUIS) 2.5 MG TABS tablet Take 1 tablet (2.5 mg total) by mouth 2 (two) times daily.  60 tablet  6  . Ascorbic Acid (VITAMIN C PO) Take 1 tablet by mouth daily.       Marland Kitchen aspirin EC 81 MG tablet Take 81 mg by mouth daily.      Marland Kitchen  CALCIUM PO Take 1 tablet by mouth daily.       . carvedilol (COREG) 3.125 MG tablet Take 1 tablet (3.125 mg total) by mouth 2 (two) times daily with a meal.  60 tablet  3  . Cholecalciferol (VITAMIN D PO) Take 1 capsule by mouth daily.       . Coenzyme Q10 (COQ10 PO) Take 1 tablet by mouth daily.       . fish oil-omega-3 fatty acids 1000 MG capsule Take 1 g by mouth daily.       Marland Kitchen KRILL OIL PO Take by mouth daily.        Marland Kitchen LYCOPENE PO Take 1 tablet by mouth daily.       . niacin (NIASPAN) 1000 MG CR tablet Take 2,000 mg by mouth at bedtime.      . potassium chloride SA (K-DUR,KLOR-CON) 20 MEQ tablet Take 1 tablet (20 mEq total) by mouth daily.  90  tablet  2  . Selenium 200 MCG CAPS Take 1 capsule by mouth daily.       Marland Kitchen torsemide (DEMADEX) 20 MG tablet Take 2 tablets (40 mg total) by mouth daily.  65 tablet  5  . VITAMIN E PO Take 1 capsule by mouth daily.        No current facility-administered medications on file prior to visit.    No Known Allergies  Past Medical History  Diagnosis Date  . Coronary artery disease     a. s/p remote CABG x 2(VG->OM, LIMA->LAD;  b. Late 90's s/p PCI of RCA;  c. 12/09 Cath/PCI: LM 28m, 70-80d (3.0x37mm Xience DES), LAD100p, LCX 80-90p (2.25x80mmTaxus Atom DES), RCA 100d (2.5x30mm Xience DES), VG->OM 100, LIMA->LAD nl, EF 30-35%.  . Hyperlipidemia   . Prostate cancer   . PAF (paroxysmal atrial fibrillation)   . Ischemic cardiomyopathy     a. EF 35-40% by echo 05/2012  . Symptomatic bradycardia     a. 08/2003 s/p MDT Enpulse [id number] Dual chamber PPM ser # [id number] H.  . Systolic CHF     Past Surgical History  Procedure Laterality Date  . Coronary artery bypass graft    . Insert / replace / remove pacemaker    . Doppler echocardiography  06/24/2002    EF 60-65%  . Tee without cardioversion  06/26/2012    Procedure: TRANSESOPHAGEAL ECHOCARDIOGRAM (TEE);  Surgeon: Lelon Perla, MD;  Location: [facility] ENDOSCOPY;  Service: Cardiovascular;  Laterality: [name];  . Cardioversion  06/26/2012    Procedure: CARDIOVERSION;  Surgeon: Lelon Perla, MD;  Location: [place];  Service: Cardiovascular;  Laterality: [name];  . Appendectomy    . Angioplasty      stent placement    History  Smoking status  . Former Smoker  . Quit date: 02/10/1971  Smokeless tobacco  . Never Used    History  Alcohol Use No    Family History  Problem Relation Age of Onset  . Hypertension Mother   . Colon cancer Mother   . Alcohol abuse Father     Review of Systems: The review of systems is per the HPI.  All other systems were reviewed and are negative.  Physical Exam: BP 110/78  Pulse 82  Ht 5\' 8"  (1.727 m)  Wt  157 lb 12.8 oz (71.578 kg)  BMI 24.00 kg/m2 Patient is alert, looks chronically ill but in no acute distress. Skin is warm and dry. Color is normal.   HEENT is unremarkable. Normocephalic/atraumatic. PERRL. Sclera are nonicteric. Neck  is supple. No masses. No JVD.  Lungs are clear.  Cardiac exam shows a regular rhythm.  He has occasional premature beats. Rate is controlled. Abdomen is soft.  Extremities are without edema.  Gait and ROM are intact. No gross neurologic deficits noted.   LABORATORY DATA: Lab Results  Component Value Date   WBC 3.8* 03/24/2013   HGB 11.0* 03/24/2013   HCT 33.3* 03/24/2013   PLT 171.0 03/24/2013   GLUCOSE 103* 04/06/2013   CHOL 160 02/02/2013   TRIG 56.0 02/02/2013   HDL 72.20 02/02/2013   LDLCALC 77 02/02/2013   ALT 19 02/02/2013   AST 30 02/02/2013   NA 140 04/06/2013   K 3.9 04/06/2013   CL 101 04/06/2013   CREATININE 1.7* 04/06/2013   BUN 40* 04/06/2013   CO2 30 04/06/2013   TSH 0.736 05/24/2012   INR 1.47 06/28/2012   EKG: Dec. 16, 2014:   AV pacing at 82.    Assessment / Plan:

## 2013-05-05 NOTE — Assessment & Plan Note (Signed)
No angina 

## 2013-05-05 NOTE — Patient Instructions (Signed)
Your physician wants you to follow-up in: 6 months  You will receive a reminder letter in the mail two months in advance. If you don't receive a letter, please call our office to schedule the follow-up appointment.   Your physician recommends that you continue on your current medications as directed. Please refer to the Current Medication list given to you today.   Your physician recommends that you return for a FASTING lipid profile: 6 months   

## 2013-06-03 ENCOUNTER — Telehealth: Payer: Self-pay | Admitting: Cardiovascular Disease

## 2013-06-03 NOTE — Telephone Encounter (Signed)
Wife was told he can have Corocidin HBP cold and flu. Or anything without decongestant like regular mucenex. Told her to take him to urgent care if needed since he does not have a PCP. Wife agreed to plan.

## 2013-06-03 NOTE — Telephone Encounter (Signed)
New message    C/o laryngitis, cold symptoms what can he take?

## 2013-06-12 ENCOUNTER — Ambulatory Visit (INDEPENDENT_AMBULATORY_CARE_PROVIDER_SITE_OTHER): Payer: Medicare Other | Admitting: *Deleted

## 2013-06-12 DIAGNOSIS — I495 Sick sinus syndrome: Secondary | ICD-10-CM

## 2013-06-12 DIAGNOSIS — I4891 Unspecified atrial fibrillation: Secondary | ICD-10-CM

## 2013-06-12 LAB — MDC_IDC_ENUM_SESS_TYPE_REMOTE
Brady Statistic AP VS Percent: 1 %
Brady Statistic AS VP Percent: 14 %
Brady Statistic AS VS Percent: 10 %
Lead Channel Impedance Value: 388 Ohm
Lead Channel Pacing Threshold Amplitude: 0.5 V
Lead Channel Pacing Threshold Amplitude: 0.625 V
Lead Channel Sensing Intrinsic Amplitude: 1 mV
Lead Channel Setting Pacing Pulse Width: 0.4 ms
Lead Channel Setting Sensing Sensitivity: 4 mV
MDC IDC MSMT BATTERY IMPEDANCE: 125 Ohm
MDC IDC MSMT BATTERY REMAINING LONGEVITY: 109 mo
MDC IDC MSMT BATTERY VOLTAGE: 2.78 V
MDC IDC MSMT LEADCHNL RA IMPEDANCE VALUE: 434 Ohm
MDC IDC MSMT LEADCHNL RA PACING THRESHOLD PULSEWIDTH: 0.4 ms
MDC IDC MSMT LEADCHNL RV PACING THRESHOLD PULSEWIDTH: 0.4 ms
MDC IDC SESS DTM: 20150123212828
MDC IDC SET LEADCHNL RA PACING AMPLITUDE: 2 V
MDC IDC SET LEADCHNL RV PACING AMPLITUDE: 2.5 V
MDC IDC STAT BRADY AP VP PERCENT: 75 %

## 2013-06-15 ENCOUNTER — Encounter: Payer: Self-pay | Admitting: Internal Medicine

## 2013-06-16 ENCOUNTER — Encounter: Payer: Self-pay | Admitting: *Deleted

## 2013-07-22 ENCOUNTER — Telehealth: Payer: Self-pay | Admitting: Cardiovascular Disease

## 2013-07-22 NOTE — Telephone Encounter (Signed)
New message    Patient wife calling regarding a prescription for patient.     Patient wife stated he's Not out of medication .    Last night had a deep cough, patient refuse to spit up . Wife wants to know can she give him over counter medication. Pt refuse er visit last night. pcp was not contacted.

## 2013-07-22 NOTE — Telephone Encounter (Signed)
She was told to go to the pharmacy and have them suggest an OTC med without decongestant.  She agreed to plan.

## 2013-07-25 DIAGNOSIS — R059 Cough, unspecified: Secondary | ICD-10-CM | POA: Diagnosis not present

## 2013-07-25 DIAGNOSIS — J069 Acute upper respiratory infection, unspecified: Secondary | ICD-10-CM | POA: Diagnosis not present

## 2013-07-25 DIAGNOSIS — R05 Cough: Secondary | ICD-10-CM | POA: Diagnosis not present

## 2013-07-28 ENCOUNTER — Inpatient Hospital Stay (HOSPITAL_COMMUNITY)
Admission: EM | Admit: 2013-07-28 | Discharge: 2013-07-31 | DRG: 253 | Disposition: A | Discharge status: Home / Self Care | Payer: Medicare Other | Attending: Vascular Surgery | Admitting: Vascular Surgery

## 2013-07-28 ENCOUNTER — Encounter (HOSPITAL_COMMUNITY): Payer: Medicare Other | Admitting: Anesthesiology

## 2013-07-28 ENCOUNTER — Encounter (HOSPITAL_COMMUNITY): Payer: Self-pay | Admitting: Emergency Medicine

## 2013-07-28 ENCOUNTER — Encounter (HOSPITAL_COMMUNITY)
Admission: EM | Disposition: A | Discharge status: Home / Self Care | Payer: Self-pay | Source: Home / Self Care | Attending: Vascular Surgery

## 2013-07-28 ENCOUNTER — Emergency Department (HOSPITAL_COMMUNITY): Payer: Medicare Other | Admitting: Anesthesiology

## 2013-07-28 ENCOUNTER — Other Ambulatory Visit: Payer: Self-pay | Admitting: Cardiovascular Disease

## 2013-07-28 DIAGNOSIS — I2589 Other forms of chronic ischemic heart disease: Secondary | ICD-10-CM | POA: Diagnosis present

## 2013-07-28 DIAGNOSIS — I509 Heart failure, unspecified: Secondary | ICD-10-CM | POA: Diagnosis present

## 2013-07-28 DIAGNOSIS — I999 Unspecified disorder of circulatory system: Secondary | ICD-10-CM | POA: Diagnosis not present

## 2013-07-28 DIAGNOSIS — M79609 Pain in unspecified limb: Secondary | ICD-10-CM | POA: Diagnosis present

## 2013-07-28 DIAGNOSIS — I743 Embolism and thrombosis of arteries of the lower extremities: Secondary | ICD-10-CM | POA: Diagnosis not present

## 2013-07-28 DIAGNOSIS — Z91199 Patient's noncompliance with other medical treatment and regimen due to unspecified reason: Secondary | ICD-10-CM | POA: Diagnosis not present

## 2013-07-28 DIAGNOSIS — I998 Other disorder of circulatory system: Secondary | ICD-10-CM | POA: Diagnosis present

## 2013-07-28 DIAGNOSIS — Z800 Family history of malignant neoplasm of digestive organs: Secondary | ICD-10-CM

## 2013-07-28 DIAGNOSIS — I5022 Chronic systolic (congestive) heart failure: Secondary | ICD-10-CM | POA: Diagnosis present

## 2013-07-28 DIAGNOSIS — I739 Peripheral vascular disease, unspecified: Secondary | ICD-10-CM | POA: Diagnosis not present

## 2013-07-28 DIAGNOSIS — M2420 Disorder of ligament, unspecified site: Secondary | ICD-10-CM | POA: Diagnosis not present

## 2013-07-28 DIAGNOSIS — C61 Malignant neoplasm of prostate: Secondary | ICD-10-CM | POA: Diagnosis not present

## 2013-07-28 DIAGNOSIS — Z8249 Family history of ischemic heart disease and other diseases of the circulatory system: Secondary | ICD-10-CM

## 2013-07-28 DIAGNOSIS — R9431 Abnormal electrocardiogram [ECG] [EKG]: Secondary | ICD-10-CM | POA: Diagnosis not present

## 2013-07-28 DIAGNOSIS — I2510 Atherosclerotic heart disease of native coronary artery without angina pectoris: Secondary | ICD-10-CM | POA: Diagnosis present

## 2013-07-28 DIAGNOSIS — E785 Hyperlipidemia, unspecified: Secondary | ICD-10-CM | POA: Diagnosis present

## 2013-07-28 DIAGNOSIS — Z9861 Coronary angioplasty status: Secondary | ICD-10-CM

## 2013-07-28 DIAGNOSIS — Z87891 Personal history of nicotine dependence: Secondary | ICD-10-CM | POA: Diagnosis not present

## 2013-07-28 DIAGNOSIS — I10 Essential (primary) hypertension: Secondary | ICD-10-CM | POA: Diagnosis not present

## 2013-07-28 DIAGNOSIS — I4892 Unspecified atrial flutter: Secondary | ICD-10-CM | POA: Diagnosis present

## 2013-07-28 DIAGNOSIS — Z8546 Personal history of malignant neoplasm of prostate: Secondary | ICD-10-CM | POA: Diagnosis not present

## 2013-07-28 DIAGNOSIS — Z951 Presence of aortocoronary bypass graft: Secondary | ICD-10-CM | POA: Diagnosis not present

## 2013-07-28 DIAGNOSIS — I495 Sick sinus syndrome: Secondary | ICD-10-CM

## 2013-07-28 DIAGNOSIS — I70229 Atherosclerosis of native arteries of extremities with rest pain, unspecified extremity: Secondary | ICD-10-CM | POA: Diagnosis not present

## 2013-07-28 DIAGNOSIS — Z9119 Patient's noncompliance with other medical treatment and regimen: Secondary | ICD-10-CM | POA: Diagnosis not present

## 2013-07-28 DIAGNOSIS — I471 Supraventricular tachycardia: Secondary | ICD-10-CM | POA: Diagnosis not present

## 2013-07-28 DIAGNOSIS — E876 Hypokalemia: Secondary | ICD-10-CM | POA: Diagnosis not present

## 2013-07-28 DIAGNOSIS — Z7901 Long term (current) use of anticoagulants: Secondary | ICD-10-CM

## 2013-07-28 DIAGNOSIS — I517 Cardiomegaly: Secondary | ICD-10-CM | POA: Diagnosis not present

## 2013-07-28 DIAGNOSIS — I498 Other specified cardiac arrhythmias: Secondary | ICD-10-CM | POA: Diagnosis present

## 2013-07-28 DIAGNOSIS — I4891 Unspecified atrial fibrillation: Secondary | ICD-10-CM | POA: Diagnosis present

## 2013-07-28 DIAGNOSIS — Z950 Presence of cardiac pacemaker: Secondary | ICD-10-CM

## 2013-07-28 DIAGNOSIS — R209 Unspecified disturbances of skin sensation: Secondary | ICD-10-CM | POA: Diagnosis present

## 2013-07-28 DIAGNOSIS — M629 Disorder of muscle, unspecified: Secondary | ICD-10-CM | POA: Diagnosis not present

## 2013-07-28 DIAGNOSIS — I255 Ischemic cardiomyopathy: Secondary | ICD-10-CM | POA: Diagnosis present

## 2013-07-28 HISTORY — PX: EMBOLECTOMY: SHX44

## 2013-07-28 LAB — BASIC METABOLIC PANEL
BUN: 29 mg/dL — ABNORMAL HIGH (ref 6–23)
CALCIUM: 8.9 mg/dL (ref 8.4–10.5)
CO2: 28 meq/L (ref 19–32)
Chloride: 98 mEq/L (ref 96–112)
Creatinine, Ser: 1.19 mg/dL (ref 0.50–1.35)
GFR calc Af Amer: 64 mL/min — ABNORMAL LOW (ref >90)
GFR calc non Af Amer: 55 mL/min — ABNORMAL LOW (ref >90)
Glucose, Bld: 93 mg/dL (ref 70–99)
POTASSIUM: 3.4 meq/L — AB (ref 3.7–5.3)
SODIUM: 141 meq/L (ref 137–147)

## 2013-07-28 LAB — CBC WITH DIFFERENTIAL/PLATELET
BASOS ABS: 0 10*3/uL (ref 0.0–0.1)
Basophils Relative: 0 % (ref 0–1)
Eosinophils Absolute: 0.1 10*3/uL (ref 0.0–0.7)
Eosinophils Relative: 2 % (ref 0–5)
HCT: 36.5 % — ABNORMAL LOW (ref 39.0–52.0)
Hemoglobin: 11.9 g/dL — ABNORMAL LOW (ref 13.0–17.0)
LYMPHS ABS: 0.8 10*3/uL (ref 0.7–4.0)
Lymphocytes Relative: 17 % (ref 12–46)
MCH: 27.4 pg (ref 26.0–34.0)
MCHC: 32.6 g/dL (ref 30.0–36.0)
MCV: 83.9 fL (ref 78.0–100.0)
Monocytes Absolute: 0.5 10*3/uL (ref 0.1–1.0)
Monocytes Relative: 11 % (ref 3–12)
NEUTROS PCT: 70 % (ref 43–77)
Neutro Abs: 3.1 10*3/uL (ref 1.7–7.7)
Platelets: 172 10*3/uL (ref 150–400)
RBC: 4.35 MIL/uL (ref 4.22–5.81)
RDW: 17.5 % — AB (ref 11.5–15.5)
WBC: 4.5 10*3/uL (ref 4.0–10.5)

## 2013-07-28 LAB — CK: Total CK: 85 U/L (ref 7–232)

## 2013-07-28 LAB — I-STAT CG4 LACTIC ACID, ED: LACTIC ACID, VENOUS: 2.6 mmol/L — AB (ref 0.5–2.2)

## 2013-07-28 SURGERY — EMBOLECTOMY
Anesthesia: General | Site: Leg Lower | Laterality: Right

## 2013-07-28 MED ORDER — LIDOCAINE HCL (CARDIAC) 20 MG/ML IV SOLN
INTRAVENOUS | Status: AC
  Filled 2013-07-28: qty 5

## 2013-07-28 MED ORDER — PROPOFOL 10 MG/ML IV BOLUS
INTRAVENOUS | Status: DC | PRN
  Administered 2013-07-28: 100 mg via INTRAVENOUS

## 2013-07-28 MED ORDER — ALBUMIN HUMAN 5 % IV SOLN
INTRAVENOUS | Status: DC | PRN
  Administered 2013-07-28 (×2): via INTRAVENOUS

## 2013-07-28 MED ORDER — CEFAZOLIN SODIUM 1-5 GM-% IV SOLN: 1.0000 g | INTRAVENOUS | Status: AC

## 2013-07-28 MED ORDER — SODIUM CHLORIDE 0.9 % IR SOLN
Status: DC | PRN
  Administered 2013-07-28: 23:00:00

## 2013-07-28 MED ORDER — ARTIFICIAL TEARS OP OINT
TOPICAL_OINTMENT | OPHTHALMIC | Status: DC | PRN
  Administered 2013-07-28: 1 via OPHTHALMIC

## 2013-07-28 MED ORDER — HEPARIN SODIUM (PORCINE) 1000 UNIT/ML IJ SOLN
INTRAMUSCULAR | Status: DC | PRN
  Administered 2013-07-28: 6000 [IU] via INTRAVENOUS

## 2013-07-28 MED ORDER — 0.9 % SODIUM CHLORIDE (POUR BTL) OPTIME
TOPICAL | Status: DC | PRN
  Administered 2013-07-28: 2000 mL

## 2013-07-28 MED ORDER — ONDANSETRON HCL 4 MG/2ML IJ SOLN
INTRAMUSCULAR | Status: DC | PRN
  Administered 2013-07-28: 4 mg via INTRAVENOUS

## 2013-07-28 MED ORDER — ARTIFICIAL TEARS OP OINT
TOPICAL_OINTMENT | OPHTHALMIC | Status: AC
  Filled 2013-07-28: qty 3.5

## 2013-07-28 MED ORDER — SODIUM CHLORIDE 0.9 % IJ SOLN
INTRAMUSCULAR | Status: AC
  Filled 2013-07-28: qty 10

## 2013-07-28 MED ORDER — PHENYLEPHRINE 40 MCG/ML (10ML) SYRINGE FOR IV PUSH (FOR BLOOD PRESSURE SUPPORT)
PREFILLED_SYRINGE | INTRAVENOUS | Status: AC
  Filled 2013-07-28: qty 10

## 2013-07-28 MED ORDER — LACTATED RINGERS IV SOLN
INTRAVENOUS | Status: DC | PRN
  Administered 2013-07-28 (×2): via INTRAVENOUS

## 2013-07-28 MED ORDER — CEFAZOLIN SODIUM-DEXTROSE 2-3 GM-% IV SOLR
INTRAVENOUS | Status: DC | PRN
  Administered 2013-07-28: 2 g via INTRAVENOUS

## 2013-07-28 MED ORDER — CEFAZOLIN SODIUM-DEXTROSE 2-3 GM-% IV SOLR
INTRAVENOUS | Status: AC
  Filled 2013-07-28: qty 50

## 2013-07-28 MED ORDER — THROMBIN 20000 UNITS EX SOLR
CUTANEOUS | Status: AC
  Filled 2013-07-28: qty 20000

## 2013-07-28 MED ORDER — PHENYLEPHRINE HCL 10 MG/ML IJ SOLN
10.0000 mg | INTRAVENOUS | Status: DC | PRN
  Administered 2013-07-28: 25 ug/min via INTRAVENOUS

## 2013-07-28 MED ORDER — LIDOCAINE HCL (CARDIAC) 20 MG/ML IV SOLN
INTRAVENOUS | Status: DC | PRN
  Administered 2013-07-28: 50 mg via INTRAVENOUS

## 2013-07-28 MED ORDER — EPHEDRINE SULFATE 50 MG/ML IJ SOLN
INTRAMUSCULAR | Status: DC | PRN
  Administered 2013-07-28 (×2): 5 mg via INTRAVENOUS

## 2013-07-28 MED ORDER — FENTANYL CITRATE 0.05 MG/ML IJ SOLN
INTRAMUSCULAR | Status: AC
  Filled 2013-07-28: qty 5

## 2013-07-28 MED ORDER — FENTANYL CITRATE 0.05 MG/ML IJ SOLN
INTRAMUSCULAR | Status: DC | PRN
  Administered 2013-07-28: 100 ug via INTRAVENOUS

## 2013-07-28 MED ORDER — VECURONIUM BROMIDE 10 MG IV SOLR
INTRAVENOUS | Status: AC
  Filled 2013-07-28: qty 10

## 2013-07-28 MED ORDER — ONDANSETRON HCL 4 MG/2ML IJ SOLN
INTRAMUSCULAR | Status: AC
  Filled 2013-07-28: qty 2

## 2013-07-28 MED ORDER — SUCCINYLCHOLINE CHLORIDE 20 MG/ML IJ SOLN
INTRAMUSCULAR | Status: DC | PRN
  Administered 2013-07-28: 100 mg via INTRAVENOUS

## 2013-07-28 MED ORDER — SUCCINYLCHOLINE CHLORIDE 20 MG/ML IJ SOLN
INTRAMUSCULAR | Status: AC
  Filled 2013-07-28: qty 1

## 2013-07-28 MED ORDER — HEPARIN SODIUM (PORCINE) 1000 UNIT/ML IJ SOLN
INTRAMUSCULAR | Status: AC
  Filled 2013-07-28: qty 1

## 2013-07-28 SURGICAL SUPPLY — 69 items
BANDAGE ELASTIC 4 VELCRO ST LF (GAUZE/BANDAGES/DRESSINGS) ×4 IMPLANT
BANDAGE ESMARK 6X9 LF (GAUZE/BANDAGES/DRESSINGS) IMPLANT
BANDAGE GAUZE ELAST BULKY 4 IN (GAUZE/BANDAGES/DRESSINGS) ×4 IMPLANT
BNDG ESMARK 6X9 LF (GAUZE/BANDAGES/DRESSINGS)
CANISTER SUCTION 2500CC (MISCELLANEOUS) ×4 IMPLANT
CANNULA VESSEL 3MM 2 BLNT TIP (CANNULA) ×4 IMPLANT
CATH EMB 3FR 80CM (CATHETERS) ×4 IMPLANT
CATH EMB 4FR 80CM (CATHETERS) ×4 IMPLANT
CATH EMB 5FR 80CM (CATHETERS) IMPLANT
CLIP TI MEDIUM 24 (CLIP) ×4 IMPLANT
CLIP TI WIDE RED SMALL 24 (CLIP) ×4 IMPLANT
COVER SURGICAL LIGHT HANDLE (MISCELLANEOUS) ×4 IMPLANT
CUFF TOURNIQUET SINGLE 18IN (TOURNIQUET CUFF) IMPLANT
CUFF TOURNIQUET SINGLE 24IN (TOURNIQUET CUFF) IMPLANT
CUFF TOURNIQUET SINGLE 34IN LL (TOURNIQUET CUFF) IMPLANT
CUFF TOURNIQUET SINGLE 44IN (TOURNIQUET CUFF) IMPLANT
DERMABOND ADVANCED (GAUZE/BANDAGES/DRESSINGS) ×2
DERMABOND ADVANCED .7 DNX12 (GAUZE/BANDAGES/DRESSINGS) ×2 IMPLANT
DRAIN CHANNEL 15F RND FF W/TCR (WOUND CARE) ×4 IMPLANT
DRAPE WARM FLUID 44X44 (DRAPE) ×4 IMPLANT
DRAPE X-RAY CASS 24X20 (DRAPES) IMPLANT
DRSG COVADERM 4X10 (GAUZE/BANDAGES/DRESSINGS) IMPLANT
DRSG COVADERM 4X8 (GAUZE/BANDAGES/DRESSINGS) ×4 IMPLANT
ELECT REM PT RETURN 9FT ADLT (ELECTROSURGICAL) ×4
ELECTRODE REM PT RTRN 9FT ADLT (ELECTROSURGICAL) ×2 IMPLANT
EVACUATOR SILICONE 100CC (DRAIN) ×4 IMPLANT
GLOVE BIO SURGEON STRL SZ7.5 (GLOVE) ×4 IMPLANT
GLOVE BIOGEL PI IND STRL 7.0 (GLOVE) ×6 IMPLANT
GLOVE BIOGEL PI IND STRL 7.5 (GLOVE) ×2 IMPLANT
GLOVE BIOGEL PI IND STRL 8 (GLOVE) ×2 IMPLANT
GLOVE BIOGEL PI INDICATOR 7.0 (GLOVE) ×6
GLOVE BIOGEL PI INDICATOR 7.5 (GLOVE) ×2
GLOVE BIOGEL PI INDICATOR 8 (GLOVE) ×2
GLOVE SS BIOGEL STRL SZ 7.5 (GLOVE) ×2 IMPLANT
GLOVE SUPERSENSE BIOGEL SZ 7.5 (GLOVE) ×2
GOWN STRL REUS W/ TWL LRG LVL3 (GOWN DISPOSABLE) ×6 IMPLANT
GOWN STRL REUS W/TWL LRG LVL3 (GOWN DISPOSABLE) ×9
KIT BASIN OR (CUSTOM PROCEDURE TRAY) ×4 IMPLANT
KIT ROOM TURNOVER OR (KITS) ×4 IMPLANT
MARKER GRAFT CORONARY BYPASS (MISCELLANEOUS) IMPLANT
NS IRRIG 1000ML POUR BTL (IV SOLUTION) ×8 IMPLANT
PACK PERIPHERAL VASCULAR (CUSTOM PROCEDURE TRAY) ×4 IMPLANT
PAD ARMBOARD 7.5X6 YLW CONV (MISCELLANEOUS) ×8 IMPLANT
PADDING CAST COTTON 6X4 STRL (CAST SUPPLIES) IMPLANT
SET COLLECT BLD 21X3/4 12 (NEEDLE) IMPLANT
SPONGE GAUZE 4X4 12PLY STER LF (GAUZE/BANDAGES/DRESSINGS) ×8 IMPLANT
SPONGE SURGIFOAM ABS GEL 100 (HEMOSTASIS) IMPLANT
STAPLER VISISTAT 35W (STAPLE) IMPLANT
STOPCOCK 4 WAY LG BORE MALE ST (IV SETS) IMPLANT
SUT ETHILON 3 0 PS 1 (SUTURE) IMPLANT
SUT PROLENE 5 0 C 1 24 (SUTURE) ×4 IMPLANT
SUT PROLENE 6 0 BV (SUTURE) ×8 IMPLANT
SUT PROLENE 7 0 BV 1 (SUTURE) IMPLANT
SUT SILK 2 0 FS (SUTURE) ×4 IMPLANT
SUT SILK 3 0 (SUTURE)
SUT SILK 3-0 18XBRD TIE 12 (SUTURE) IMPLANT
SUT VIC AB 2-0 CTB1 (SUTURE) ×4 IMPLANT
SUT VIC AB 3-0 SH 27 (SUTURE) ×2
SUT VIC AB 3-0 SH 27X BRD (SUTURE) ×2 IMPLANT
SUT VICRYL 4-0 PS2 18IN ABS (SUTURE) ×4 IMPLANT
SYR 3ML LL SCALE MARK (SYRINGE) ×8 IMPLANT
SYR TB 1ML LUER SLIP (SYRINGE) ×4 IMPLANT
TAPE CLOTH SURG 4X10 WHT LF (GAUZE/BANDAGES/DRESSINGS) ×4 IMPLANT
TOWEL OR 17X24 6PK STRL BLUE (TOWEL DISPOSABLE) ×8 IMPLANT
TOWEL OR 17X26 10 PK STRL BLUE (TOWEL DISPOSABLE) ×12 IMPLANT
TRAY FOLEY CATH 16FRSI W/METER (SET/KITS/TRAYS/PACK) ×4 IMPLANT
TUBING EXTENTION W/L.L. (IV SETS) IMPLANT
UNDERPAD 30X30 INCONTINENT (UNDERPADS AND DIAPERS) ×4 IMPLANT
WATER STERILE IRR 1000ML POUR (IV SOLUTION) ×4 IMPLANT

## 2013-07-28 NOTE — Anesthesia Procedure Notes (Signed)
Procedure Name: Intubation Date/Time: 07/28/2013 10:48 PM Performed by: Vaughan Browner Pre-anesthesia Checklist: Patient identified, Emergency Drugs available, Suction available, Patient being monitored and Timeout performed Patient Re-evaluated:Patient Re-evaluated prior to inductionOxygen Delivery Method: Circle system utilized Preoxygenation: Pre-oxygenation with 100% oxygen Intubation Type: IV induction, Rapid sequence and Cricoid Pressure applied Laryngoscope Size: Mac and 3 Grade View: Grade I Tube type: Oral Tube size: 7.5 mm Number of attempts: 1 Airway Equipment and Method: Stylet Placement Confirmation: ETT inserted through vocal cords under direct vision,  breath sounds checked- equal and bilateral and positive ETCO2 Secured at: 23 cm Tube secured with: Tape Dental Injury: Teeth and Oropharynx as per pre-operative assessment

## 2013-07-28 NOTE — H&P (Signed)
[facility] [place]  Patient name: Marcus [name] MRN: [id number] DOB: [birth year] Sex: male  REASON FOR ADMISSION: Ischemic right leg  HPI: Marcus [name] is a 80 y.o. male with a history of atrial fibrillation, who developed the sudden onset of pain and paresthesias in his right foot at approximately 4 PM today. Initially the symptoms began from his knee down and have now settled in the right foot only. He continues to have paresthesias cold less and pain in the right foot. He has a history of atrial flutter ablation and according to his wife, has not been taking anti-coagulation for the last month. Prior to this event, he denies any history of claudication, rest pain, or nonhealing ulcers. I suspect his activity is fairly limited. His cardiologist is Dr. Mertie Moores.  Past Medical History  Diagnosis Date  . Coronary artery disease     a. s/p remote CABG x 2(VG->OM, LIMA->LAD;  b. Late 90's s/p PCI of RCA;  c. 12/09 Cath/PCI: LM 23m, 70-80d (3.0x75mm Xience DES), LAD100p, LCX 80-90p (2.25x69mmTaxus Atom DES), RCA 100d (2.5x68mm Xience DES), VG->OM 100, LIMA->LAD nl, EF 30-35%.  . Hyperlipidemia   . Prostate cancer   . PAF (paroxysmal atrial fibrillation)   . Ischemic cardiomyopathy     a. EF 35-40% by echo 05/2012  . Symptomatic bradycardia     a. 08/2003 s/p MDT Enpulse E2DR01 Dual chamber PPM ser # [id number] H.  . Systolic CHF    Family History  Problem Relation Age of Onset  . Hypertension Mother   . Colon cancer Mother   . Alcohol abuse Father    SOCIAL HISTORY: History  Substance Use Topics  . Smoking status: Former Smoker    Quit date: 02/10/1971  . Smokeless tobacco: Never Used  . Alcohol Use: No   No Known Allergies  No current facility-administered medications for this encounter.   Current Outpatient Prescriptions  Medication Sig Dispense Refill  . Ascorbic Acid (VITAMIN C PO) Take 1 tablet by mouth daily.       Marland Kitchen CALCIUM PO Take 1  tablet by mouth daily.       . carvedilol (COREG) 3.125 MG tablet Take 1 tablet (3.125 mg total) by mouth 2 (two) times daily with a meal.  60 tablet  3  . Cholecalciferol (VITAMIN D PO) Take 1 capsule by mouth daily.       . Coenzyme Q10 (COQ10 PO) Take 1 tablet by mouth daily.       . fish oil-omega-3 fatty acids 1000 MG capsule Take 1 g by mouth daily.       Marland Kitchen LYCOPENE PO Take 1 tablet by mouth daily.       . potassium chloride SA (K-DUR,KLOR-CON) 20 MEQ tablet Take 1 tablet (20 mEq total) by mouth daily.  90 tablet  2  . torsemide (DEMADEX) 20 MG tablet Take 2 tablets (40 mg total) by mouth daily.  65 tablet  5  . VITAMIN E PO Take 1 capsule by mouth 2 (two) times daily.        REVIEW OF SYSTEMS: Valu.Nieves ] denotes positive finding; [  ] denotes negative finding CARDIOVASCULAR:  [ ]  chest pain   Valu.Nieves ] chest pressure- he has a history of stable angina which has been stable in character.   [ ]  palpitations   [ ]  orthopnea  Valu.Nieves ] dyspnea on exertion   [ ]  claudication   [ ]  rest pain   [ ]   DVT   [ ]  phlebitis PULMONARY:   [ ]  productive cough   [ ]  asthma   [ ]  wheezing NEUROLOGIC:   [ ]  weakness  [ ]  paresthesias  [ ]  aphasia  [ ]  amaurosis  [ ]  dizziness HEMATOLOGIC:   [ ]  bleeding problems   [ ]  clotting disorders MUSCULOSKELETAL:  [ ]  joint pain   [ ]  joint swelling [ ]  leg swelling GASTROINTESTINAL: [ ]   blood in stool  [ ]   hematemesis GENITOURINARY:  [ ]   dysuria  [ ]   hematuria PSYCHIATRIC:  [ ]  history of major depression INTEGUMENTARY:  [ ]  rashes  [ ]  ulcers CONSTITUTIONAL:  [ ]  fever   [ ]  chills  PHYSICAL EXAM: Filed Vitals:   07/28/13 2045 07/28/13 2054 07/28/13 2100 07/28/13 2115  BP: 140/65 140/65 141/63 136/71  Pulse: 64 65 65 53  Temp:      TempSrc:      Resp:  14  24  Height:      Weight:      SpO2: 99% 99% 97% 97%   Body mass index is 22.81 kg/(m^2). GENERAL: The patient is a well-nourished male, in no acute distress. The vital signs are documented  above. CARDIOVASCULAR: There is a regular rate and rhythm. I do not detect carotid bruits. He has palpable radial pulses. In the right lower extremity, which is the symptomatic side, he has a palpable femoral and popliteal pulse. I am unable to obtain Doppler flow in the right dorsalis pedis or posterior tibial positions. The right foot is cold and slightly mottled. On the left side he has a palpable femoral, popliteal, dorsalis pedis, and posterior tibial pulse. His popliteal pulses are somewhat prominent. PULMONARY: There is good air exchange bilaterally without wheezing or rales. ABDOMEN: Soft and non-tender with normal pitched bowel sounds.  MUSCULOSKELETAL: There are no major deformities or cyanosis. NEUROLOGIC: No focal weakness or paresthesias are detected. SKIN: There are no ulcers or rashes noted. PSYCHIATRIC: The patient has a normal affect.  DATA:  Lab Results  Component Value Date   WBC 4.5 07/28/2013   HGB 11.9* 07/28/2013   HCT 36.5* 07/28/2013   MCV 83.9 07/28/2013   PLT 172 07/28/2013   Lab Results  Component Value Date   NA 140 04/06/2013   K 3.9 04/06/2013   CL 101 04/06/2013   CO2 30 04/06/2013   Lab Results  Component Value Date   CREATININE 1.7* 04/06/2013   Lab Results  Component Value Date   INR 1.47 06/28/2012   INR 1.6* 06/25/2012   INR 1.14 05/24/2012   His wife showed me a recent chest x-ray report which showed no acute disease.  EKG: A. Fib/flutter with paced complexes also.  MEDICAL ISSUES:  ACUTE ARTERIAL OCCLUSION OF THE RIGHT LOWER EXTREMITY: the most likely etiology of his acute arterial occlusion his atrial fibrillation. According to his wife, he was supposed to be on Eliquis but had not been taking this for the last month. Less likely he could have embolized from a popliteal artery aneurysm as his popliteal pulses slightly prominent. I have recommended urgent popliteal embolectomy. We have reviewed the indications for the procedure and the potential  complications, including, but not limited to, bleeding, wound healing problems, inability to restore flow to the foot, and limb loss. We will proceed urgently. All his questions were answered and he is agreeable to proceed.  [facility] [place] Beeper: [phone]

## 2013-07-28 NOTE — ED Notes (Signed)
Dr. Scot Dock with vascular surgery at bedside.

## 2013-07-28 NOTE — ED Provider Notes (Signed)
CSN: [id number]     Arrival date & time 07/28/13  2006 History   First MD Initiated Contact with Patient 07/28/13 2105     Chief Complaint  Patient presents with  . Leg Pain   HPI Comments: 80 yo M hx of CAD s/p CABG, PCI, PAF, ischemic cardiomyopathy, HLD, symptomatic bradycardia s/p pacemaker, presents with CC of right leg pain.  Symptoms began 3 hours ago.  C/o of right calf pain that now radiates down to foot, with associated decreased sensation in right foot.   Denies previous occurrence.  Denies any other symptoms.  He has had previous vein graft of this leg for remote CABG.  Pt has been taking medications at home as prescribed.  Pt with hx of PAF but is not on blood thinners.  No other complaints.    The history is provided by the patient and the spouse. No language interpreter was used.    Past Medical History  Diagnosis Date  . Coronary artery disease     a. s/p remote CABG x 2(VG->OM, LIMA->LAD;  b. Late 90's s/p PCI of RCA;  c. 12/09 Cath/PCI: LM 84m, 70-80d (3.0x65mm Xience DES), LAD100p, LCX 80-90p (2.25x59mmTaxus Atom DES), RCA 100d (2.5x16mm Xience DES), VG->OM 100, LIMA->LAD nl, EF 30-35%.  . Hyperlipidemia   . Prostate cancer   . PAF (paroxysmal atrial fibrillation)   . Ischemic cardiomyopathy     a. EF 35-40% by echo 05/2012  . Symptomatic bradycardia     a. 08/2003 s/p MDT Enpulse E2DR01 Dual chamber PPM ser # [id number] H.  . Systolic CHF    Past Surgical History  Procedure Laterality Date  . Coronary artery bypass graft    . Insert / replace / remove pacemaker    . Doppler echocardiography  06/24/2002    EF 60-65%  . Tee without cardioversion  06/26/2012    Procedure: TRANSESOPHAGEAL ECHOCARDIOGRAM (TEE);  Surgeon: Lelon Perla, MD;  Location[facility] ENDOSCOPY;  Service: Cardiovascular;  Laterality: [name];  . Cardioversion  06/26/2012    Procedure: CARDIOVERSION;  Surgeon: Lelon Perla, MD;  Location: [place];  Service: Cardiovascular;  Laterality: [name];  .  Appendectomy    . Angioplasty      stent placement   Family History  Problem Relation Age of Onset  . Hypertension Mother   . Colon cancer Mother   . Alcohol abuse Father    History  Substance Use Topics  . Smoking status: Former Smoker    Quit date: 02/10/1971  . Smokeless tobacco: Never Used  . Alcohol Use: No    Review of Systems  Constitutional: Negative for fever and chills.  Respiratory: Negative for cough and shortness of breath.   Cardiovascular: Negative for chest pain, palpitations and leg swelling.  Gastrointestinal: Negative for nausea, vomiting, abdominal pain, diarrhea and constipation.  Musculoskeletal:       R leg pain  Skin: Negative for rash.  Neurological: Positive for numbness. Negative for dizziness, weakness, light-headedness and headaches.       R foot numbness   Hematological: Negative for adenopathy. Does not bruise/bleed easily.  All other systems reviewed and are negative.      Allergies  Review of patient's allergies indicates no known allergies.  Home Medications   Current Outpatient Rx  Name  Route  Sig  Dispense  Refill  . Ascorbic Acid (VITAMIN C PO)   Oral   Take 1 tablet by mouth daily.          Marland Kitchen  CALCIUM PO   Oral   Take 1 tablet by mouth daily.          . carvedilol (COREG) 3.125 MG tablet   Oral   Take 1 tablet (3.125 mg total) by mouth 2 (two) times daily with a meal.   60 tablet   3   . Cholecalciferol (VITAMIN D PO)   Oral   Take 1 capsule by mouth daily.          . Coenzyme Q10 (COQ10 PO)   Oral   Take 1 tablet by mouth daily.          . fish oil-omega-3 fatty acids 1000 MG capsule   Oral   Take 1 g by mouth daily.          Marland Kitchen LYCOPENE PO   Oral   Take 1 tablet by mouth daily.          . potassium chloride SA (K-DUR,KLOR-CON) 20 MEQ tablet   Oral   Take 1 tablet (20 mEq total) by mouth daily.   90 tablet   2   . torsemide (DEMADEX) 20 MG tablet   Oral   Take 2 tablets (40 mg total) by  mouth daily.   65 tablet   5     Pt has to take extra at times #65   . VITAMIN E PO   Oral   Take 1 capsule by mouth 2 (two) times daily.           BP 136/71  Pulse 53  Temp(Src) 97.6 F (36.4 C) (Oral)  Resp 24  Ht 5\' 8"  (1.727 m)  Wt 150 lb (68.04 kg)  BMI 22.81 kg/m2  SpO2 97% Physical Exam  Nursing note and vitals reviewed. Constitutional: He appears well-developed and well-nourished.  HENT:  Head: Normocephalic and atraumatic.  Right Ear: External ear normal.  Left Ear: External ear normal.  Nose: Nose normal.  Mouth/Throat: Oropharynx is clear and moist.  Eyes: Conjunctivae and EOM are normal. Pupils are equal, round, and reactive to light.  Neck: Normal range of motion. Neck supple.  Cardiovascular: Normal rate, regular rhythm, normal heart sounds and intact distal pulses.   No palpable DP, PT pulses in R foot.  2+ PT, femoral pulses on R and L.  1+ PT/DP R foot.   Pulmonary/Chest: Effort normal and breath sounds normal. No respiratory distress. He has no wheezes. He has no rales. He exhibits no tenderness.  Abdominal: Soft. Bowel sounds are normal. He exhibits no distension and no mass. There is no tenderness. There is no rebound and no guarding.  Musculoskeletal: Normal range of motion.  Neurological: He is alert.  Decreased sensation of right foot.    Skin: Skin is warm. There is pallor.  Pale bilateral feet.  Right foot colder than left.  No erythema or cyanosis.      ED Course  Procedures (including critical care time) Labs Review Labs Reviewed  CBC WITH DIFFERENTIAL - Abnormal; Notable for the following:    Hemoglobin 11.9 (*)    HCT 36.5 (*)    RDW 17.5 (*)    All other components within normal limits  BASIC METABOLIC PANEL - Abnormal; Notable for the following:    Potassium 3.4 (*)    BUN 29 (*)    GFR calc non Af Amer 55 (*)    GFR calc Af Amer 64 (*)    All other components within normal limits  I-STAT CG4 LACTIC ACID, ED - Abnormal;  Notable  for the following:    Lactic Acid, Venous 2.60 (*)    All other components within normal limits  CK   Imaging Review No results found.   EKG Interpretation None      MDM   Final diagnoses:  None   80 yo M hx of CAD s/p CABG, PCI, PAF, ischemic cardiomyopathy, HLD, symptomatic bradycardia s/p pacemaker, presents with CC of right leg pain.   Filed Vitals:   07/28/13 2215  BP: 136/69  Pulse: 63  Temp:   Resp: 21   Physical exam as above. VS WNL.  Pt c/o right leg pain, numbness.  No palpable or dopplerable DP, PT pulses in R foot.  CBC, BMP, CK, Lactic acid drawn.  LA elevated 2.6, and other labs WNL.  Vascular sugery consulted, and will take pt to emergent surgery for pulseless R lower extremity.  Pt understands and agrees with plan.  Pt's care discussed with Dr. Alvino Chapel.   Sinda Du, MD        Sinda Du, MD 07/29/13 [phone]

## 2013-07-28 NOTE — Anesthesia Preprocedure Evaluation (Addendum)
Anesthesia Evaluation  Patient identified by MRN, date of birth, ID band Patient awake    Reviewed: Allergy & Precautions, H&P , NPO status , Patient's Chart, lab work & pertinent test results  Airway Mallampati: II TM Distance: >3 FB Neck ROM: Limited    Dental  (+) Dental Advisory Given   Pulmonary former smoker,          Cardiovascular hypertension, Pt. on medications and Pt. on home beta blockers +CHF + pacemaker     Neuro/Psych    GI/Hepatic   Endo/Other    Renal/GU      Musculoskeletal   Abdominal   Peds  Hematology  (+) anemia ,   Anesthesia Other Findings   Reproductive/Obstetrics                          Anesthesia Physical Anesthesia Plan  ASA: III  Anesthesia Plan: General   Post-op Pain Management:    Induction: Intravenous  Airway Management Planned: Oral ETT  Additional Equipment:   Intra-op Plan:   Post-operative Plan: Extubation in OR  Informed Consent: I have reviewed the patients History and Physical, chart, labs and discussed the procedure including the risks, benefits and alternatives for the proposed anesthesia with the patient or authorized representative who has indicated his/her understanding and acceptance.   Dental advisory given  Plan Discussed with:   Anesthesia Plan Comments:        Anesthesia Quick Evaluation

## 2013-07-28 NOTE — ED Notes (Signed)
Pt reports right calf pain onset today. Pt reports a decrease sensation in right foot. Pt does not take blood thinners. Right ankle feels cool and discolored compared to the left. Pt is A&Ox4, respirations equal and unlabored, skin warm and dry.

## 2013-07-29 ENCOUNTER — Encounter (HOSPITAL_COMMUNITY): Payer: Self-pay

## 2013-07-29 DIAGNOSIS — Z950 Presence of cardiac pacemaker: Secondary | ICD-10-CM

## 2013-07-29 DIAGNOSIS — I509 Heart failure, unspecified: Secondary | ICD-10-CM | POA: Diagnosis not present

## 2013-07-29 DIAGNOSIS — I4892 Unspecified atrial flutter: Secondary | ICD-10-CM | POA: Diagnosis not present

## 2013-07-29 DIAGNOSIS — I5022 Chronic systolic (congestive) heart failure: Secondary | ICD-10-CM | POA: Diagnosis not present

## 2013-07-29 DIAGNOSIS — I10 Essential (primary) hypertension: Secondary | ICD-10-CM

## 2013-07-29 DIAGNOSIS — I999 Unspecified disorder of circulatory system: Secondary | ICD-10-CM

## 2013-07-29 DIAGNOSIS — E785 Hyperlipidemia, unspecified: Secondary | ICD-10-CM

## 2013-07-29 DIAGNOSIS — I4891 Unspecified atrial fibrillation: Secondary | ICD-10-CM

## 2013-07-29 DIAGNOSIS — I495 Sick sinus syndrome: Secondary | ICD-10-CM

## 2013-07-29 DIAGNOSIS — I517 Cardiomegaly: Secondary | ICD-10-CM

## 2013-07-29 DIAGNOSIS — I743 Embolism and thrombosis of arteries of the lower extremities: Secondary | ICD-10-CM | POA: Diagnosis not present

## 2013-07-29 DIAGNOSIS — I2589 Other forms of chronic ischemic heart disease: Secondary | ICD-10-CM

## 2013-07-29 DIAGNOSIS — I998 Other disorder of circulatory system: Secondary | ICD-10-CM | POA: Diagnosis present

## 2013-07-29 DIAGNOSIS — I70229 Atherosclerosis of native arteries of extremities with rest pain, unspecified extremity: Secondary | ICD-10-CM

## 2013-07-29 LAB — CBC
HCT: 30.8 % — ABNORMAL LOW (ref 39.0–52.0)
Hemoglobin: 10.2 g/dL — ABNORMAL LOW (ref 13.0–17.0)
MCH: 27.9 pg (ref 26.0–34.0)
MCHC: 33.1 g/dL (ref 30.0–36.0)
MCV: 84.2 fL (ref 78.0–100.0)
PLATELETS: 140 10*3/uL — AB (ref 150–400)
RBC: 3.66 MIL/uL — ABNORMAL LOW (ref 4.22–5.81)
RDW: 17.7 % — AB (ref 11.5–15.5)
WBC: 4.1 10*3/uL (ref 4.0–10.5)

## 2013-07-29 LAB — BASIC METABOLIC PANEL
BUN: 20 mg/dL (ref 6–23)
CALCIUM: 7.9 mg/dL — AB (ref 8.4–10.5)
CHLORIDE: 100 meq/L (ref 96–112)
CO2: 27 mEq/L (ref 19–32)
CREATININE: 1.13 mg/dL (ref 0.50–1.35)
GFR, EST AFRICAN AMERICAN: 68 mL/min — AB (ref >90)
GFR, EST NON AFRICAN AMERICAN: 59 mL/min — AB (ref >90)
Glucose, Bld: 101 mg/dL — ABNORMAL HIGH (ref 70–99)
Potassium: 4.2 mEq/L (ref 3.7–5.3)
Sodium: 139 mEq/L (ref 137–147)

## 2013-07-29 LAB — HEPARIN LEVEL (UNFRACTIONATED)
HEPARIN UNFRACTIONATED: 0.28 [IU]/mL — AB (ref 0.30–0.70)
Heparin Unfractionated: 0.1 IU/mL — ABNORMAL LOW (ref 0.30–0.70)

## 2013-07-29 LAB — MRSA PCR SCREENING: MRSA BY PCR: NEGATIVE

## 2013-07-29 LAB — APTT: aPTT: 69 seconds — ABNORMAL HIGH (ref 24–37)

## 2013-07-29 MED ORDER — OMEGA-3 FATTY ACIDS 1000 MG PO CAPS: 1.0000 g | ORAL_CAPSULE | Freq: Every day | ORAL | Status: DC

## 2013-07-29 MED ORDER — POTASSIUM CHLORIDE IN NACL 20-0.9 MEQ/L-% IV SOLN
INTRAVENOUS | Status: DC
  Administered 2013-07-29 – 2013-07-30 (×3): via INTRAVENOUS
  Filled 2013-07-29 (×8): qty 1000

## 2013-07-29 MED ORDER — POTASSIUM CHLORIDE CRYS ER 20 MEQ PO TBCR
20.0000 meq | EXTENDED_RELEASE_TABLET | Freq: Every day | ORAL | Status: DC
  Administered 2013-07-29 – 2013-07-31 (×3): 20 meq via ORAL
  Filled 2013-07-29 (×3): qty 1

## 2013-07-29 MED ORDER — HYDROMORPHONE HCL PF 1 MG/ML IJ SOLN: 0.2500 mg | INTRAMUSCULAR | Status: DC | PRN

## 2013-07-29 MED ORDER — POTASSIUM CHLORIDE CRYS ER 20 MEQ PO TBCR: 20.0000 meq | EXTENDED_RELEASE_TABLET | Freq: Every day | ORAL | Status: DC | PRN

## 2013-07-29 MED ORDER — ACETAMINOPHEN 650 MG RE SUPP: 325.0000 mg | RECTAL | Status: DC | PRN

## 2013-07-29 MED ORDER — CARVEDILOL 3.125 MG PO TABS
3.1250 mg | ORAL_TABLET | Freq: Two times a day (BID) | ORAL | Status: DC
  Administered 2013-07-29 – 2013-07-31 (×5): 3.125 mg via ORAL
  Filled 2013-07-29 (×8): qty 1

## 2013-07-29 MED ORDER — METOPROLOL TARTRATE 1 MG/ML IV SOLN: 2.0000 mg | INTRAVENOUS | Status: DC | PRN

## 2013-07-29 MED ORDER — SODIUM CHLORIDE 0.9 % IV SOLN
500.0000 mL | Freq: Once | INTRAVENOUS | Status: AC | PRN
  Administered 2013-07-29: 500 mL via INTRAVENOUS

## 2013-07-29 MED ORDER — TORSEMIDE 20 MG PO TABS
40.0000 mg | ORAL_TABLET | Freq: Every day | ORAL | Status: DC
  Administered 2013-07-29 – 2013-07-31 (×3): 40 mg via ORAL
  Filled 2013-07-29 (×3): qty 2

## 2013-07-29 MED ORDER — DOCUSATE SODIUM 100 MG PO CAPS
100.0000 mg | ORAL_CAPSULE | Freq: Every day | ORAL | Status: DC
Start: 2013-07-30 — End: 2013-07-31
  Administered 2013-07-30 – 2013-07-31 (×2): 100 mg via ORAL
  Filled 2013-07-29 (×2): qty 1

## 2013-07-29 MED ORDER — LABETALOL HCL 5 MG/ML IV SOLN
10.0000 mg | INTRAVENOUS | Status: DC | PRN
  Filled 2013-07-29: qty 4

## 2013-07-29 MED ORDER — PANTOPRAZOLE SODIUM 40 MG PO TBEC
40.0000 mg | DELAYED_RELEASE_TABLET | Freq: Every day | ORAL | Status: DC
  Administered 2013-07-29 – 2013-07-31 (×3): 40 mg via ORAL
  Filled 2013-07-29 (×3): qty 1

## 2013-07-29 MED ORDER — ACETAMINOPHEN 325 MG PO TABS: 325.0000 mg | ORAL_TABLET | ORAL | Status: DC | PRN

## 2013-07-29 MED ORDER — OMEGA-3-ACID ETHYL ESTERS 1 G PO CAPS
1.0000 g | ORAL_CAPSULE | Freq: Every day | ORAL | Status: DC
  Administered 2013-07-29 – 2013-07-31 (×3): 1 g via ORAL
  Filled 2013-07-29 (×3): qty 1

## 2013-07-29 MED ORDER — DOPAMINE-DEXTROSE 3.2-5 MG/ML-% IV SOLN: 3.0000 ug/kg/min | INTRAVENOUS | Status: DC

## 2013-07-29 MED ORDER — CEFUROXIME SODIUM 1.5 G IJ SOLR
1.5000 g | Freq: Two times a day (BID) | INTRAMUSCULAR | Status: AC
  Administered 2013-07-29 (×2): 1.5 g via INTRAVENOUS
  Filled 2013-07-29 (×2): qty 1.5

## 2013-07-29 MED ORDER — OXYCODONE-ACETAMINOPHEN 5-325 MG PO TABS
1.0000 | ORAL_TABLET | ORAL | Status: DC | PRN
  Administered 2013-07-29 – 2013-07-30 (×2): 1 via ORAL
  Filled 2013-07-29 (×2): qty 1

## 2013-07-29 MED ORDER — HYDRALAZINE HCL 20 MG/ML IJ SOLN: 10.0000 mg | INTRAMUSCULAR | Status: DC | PRN

## 2013-07-29 MED ORDER — PHENOL 1.4 % MT LIQD: 1.0000 | OROMUCOSAL | Status: DC | PRN

## 2013-07-29 MED ORDER — MAGNESIUM SULFATE 40 MG/ML IJ SOLN
2.0000 g | Freq: Every day | INTRAMUSCULAR | Status: DC | PRN
  Filled 2013-07-29: qty 50

## 2013-07-29 MED ORDER — HYDROMORPHONE HCL PF 1 MG/ML IJ SOLN
0.5000 mg | INTRAMUSCULAR | Status: DC | PRN
  Administered 2013-07-29: 1 mg via INTRAVENOUS
  Filled 2013-07-29: qty 1

## 2013-07-29 MED ORDER — ALUM & MAG HYDROXIDE-SIMETH 200-200-20 MG/5ML PO SUSP: 15.0000 mL | ORAL | Status: DC | PRN

## 2013-07-29 MED ORDER — GUAIFENESIN-DM 100-10 MG/5ML PO SYRP: 15.0000 mL | ORAL_SOLUTION | ORAL | Status: DC | PRN

## 2013-07-29 MED ORDER — ONDANSETRON HCL 4 MG/2ML IJ SOLN: 4.0000 mg | Freq: Four times a day (QID) | INTRAMUSCULAR | Status: DC | PRN

## 2013-07-29 MED ORDER — HEPARIN (PORCINE) IN NACL 100-0.45 UNIT/ML-% IJ SOLN
1350.0000 [IU]/h | INTRAMUSCULAR | Status: DC
  Administered 2013-07-29 (×2): 1200 [IU]/h via INTRAVENOUS
  Administered 2013-07-29: 1000 [IU]/h via INTRAVENOUS
  Administered 2013-07-30: 1350 [IU]/h via INTRAVENOUS
  Filled 2013-07-29 (×4): qty 250

## 2013-07-29 MED ORDER — ONDANSETRON HCL 4 MG/2ML IJ SOLN
4.0000 mg | Freq: Once | INTRAMUSCULAR | Status: DC | PRN
  Filled 2013-07-29: qty 2

## 2013-07-29 NOTE — Evaluation (Signed)
Physical Therapy Evaluation Patient Details Name: Marcus [name] MRN: [id number] DOB: 1928/08/21 Today's Date: 07/29/2013 Time: [id number] PT Time Calculation (min): 27 min  PT Assessment / Plan / Recommendation History of Present Illness  Marcus [name] is a 80 y.o. male with a history of atrial fibrillation who developed the sudden onset of pain and paresthesias in the right foot. S/P R popliteal tibial embolectomy   Clinical Impression  Pt adm with the dx described above. Pt demonstrated safe function mobility and transisitions with the use of a RW and min guard. Pt plans to return home where he will be alone during the day while his wife is at work. Pt to benefit from skilled acute PT to improve functional mobility to mod I for safe D/C home.    PT Assessment  Patient needs continued PT services    Follow Up Recommendations  No PT follow up    Does the patient have the potential to tolerate intense rehabilitation      Barriers to Discharge Decreased caregiver support pt's wife works. pt will be alone during the day at home    Equipment Recommendations  Rolling walker with 5" wheels    Recommendations for Other Services     Frequency Min 3X/week    Precautions / Restrictions Precautions Precautions: Fall Restrictions Weight Bearing Restrictions: No   Pertinent Vitals/Pain Pt reports pain at the incision site      Mobility  Transfers Overall transfer level: Modified independent Equipment used: Rolling walker (2 wheeled) Ambulation/Gait Ambulation/Gait assistance: Min guard Ambulation Distance (Feet): 350 Feet Assistive device: Rolling walker (2 wheeled) Gait Pattern/deviations: Step-through pattern;Decreased stride length Gait velocity: guarded for falls General Gait Details: Pt demonstrated safety with RW Stairs: Yes Stairs assistance: Modified independent (Device/Increase time) Stair Management: Step to pattern (hand held assist) Number of Stairs:  1 General stair comments: hand held assist to simulate home set up with assist from wife    Exercises     PT Diagnosis: Difficulty walking;Acute pain  PT Problem List: Decreased mobility;Decreased knowledge of use of DME;Decreased strength;Decreased balance PT Treatment Interventions: DME instruction;Gait training;Stair training;Functional mobility training;Therapeutic activities;Therapeutic exercise;Balance training;Patient/family education     PT Goals(Current goals can be found in the care plan section) Acute Rehab PT Goals Patient Stated Goal: return home PT Goal Formulation: With patient Time For Goal Achievement: 08/05/13 Potential to Achieve Goals: Good  Visit Information  Last PT Received On: 07/29/13 Assistance Needed: +1 History of Present Illness: Marcus [name] is a 80 y.o. male with a history of atrial fibrillation who developed the sudden onset of pain and paresthesias in the right foot. S/P R popliteal tibial embolectomy        Prior Functioning  Home Living Family/patient expects to be discharged to:: Private residence Living Arrangements: Spouse/significant other Available Help at Discharge: Family Type of Home: House Home Access: Stairs to enter [employer] of Steps: 1 Entrance Stairs-Rails: None Home Layout: One level Home Equipment: None Additional Comments: His home has 3 STE but he enters through the garage which only has 1 STE Prior Function Level of Independence: Independent Communication Communication: No difficulties    Cognition  Cognition Arousal/Alertness: Awake/alert Behavior During Therapy: WFL for tasks assessed/performed Overall Cognitive Status: Within Functional Limits for tasks assessed    Extremity/Trunk Assessment Upper Extremity Assessment Upper Extremity Assessment: Overall WFL for tasks assessed Lower Extremity Assessment Lower Extremity Assessment: Overall WFL for tasks assessed   Balance Balance Overall  balance assessment: Needs assistance Sitting-balance  support: No upper extremity supported Sitting balance-Leahy Scale: Good Standing balance support: Single extremity supported Standing balance-Leahy Scale: Good Standing balance comment: pt intermitently used RW for standing support. pt demonstrate ability to stand with out support for 1 min General Comments General comments (skin integrity, edema, etc.): pt reports pain in R LE at the incision site  End of Session PT - End of Session Equipment Utilized During Treatment: Gait belt (RW) Activity Tolerance: Patient tolerated treatment well Patient left: in chair;with call bell/phone within reach Nurse Communication: Mobility status  GP     Brasen Bundren SPT 07/29/2013, 9:10 AM

## 2013-07-29 NOTE — Progress Notes (Signed)
ANTICOAGULATION CONSULT NOTE - Initial Consult  Pharmacy Consult for Heparin Indication: Afib, RLE thrombus s/p embolectomy  No Known Allergies  Patient Measurements: Height: 5\' 8"  (172.7 cm) Weight: 150 lb (68.04 kg) IBW/kg (Calculated) : 68.4  Vital Signs: Temp: 98.9 F (37.2 C) (03/11 0146) Temp src: Oral (03/11 0146) BP: 102/58 mmHg (03/11 0200) Pulse Rate: 59 (03/11 0200)  Labs:  Recent Labs  07/28/13 2137  HGB 11.9*  HCT 36.5*  PLT 172  CREATININE 1.19  CKTOTAL 85    Estimated Creatinine Clearance: 46 ml/min (by C-G formula based on Cr of 1.19).   Medical History: Past Medical History  Diagnosis Date  . Coronary artery disease     a. s/p remote CABG x 2(VG->OM, LIMA->LAD;  b. Late 90's s/p PCI of RCA;  c. 12/09 Cath/PCI: LM 10m, 70-80d (3.0x22mm Xience DES), LAD100p, LCX 80-90p (2.25x72mmTaxus Atom DES), RCA 100d (2.5x82mm Xience DES), VG->OM 100, LIMA->LAD nl, EF 30-35%.  . Hyperlipidemia   . Prostate cancer   . PAF (paroxysmal atrial fibrillation)   . Ischemic cardiomyopathy     a. EF 35-40% by echo 05/2012  . Symptomatic bradycardia     a. 08/2003 s/p MDT Enpulse [id number] Dual chamber PPM ser # [id number] H.  . Systolic CHF     Medications:  Prescriptions prior to admission  Medication Sig Dispense Refill  . Ascorbic Acid (VITAMIN C PO) Take 1 tablet by mouth daily.       Marland Kitchen CALCIUM PO Take 1 tablet by mouth daily.       . carvedilol (COREG) 3.125 MG tablet Take 1 tablet (3.125 mg total) by mouth 2 (two) times daily with a meal.  60 tablet  3  . Cholecalciferol (VITAMIN D PO) Take 1 capsule by mouth daily.       . Coenzyme Q10 (COQ10 PO) Take 1 tablet by mouth daily.       . fish oil-omega-3 fatty acids 1000 MG capsule Take 1 g by mouth daily.       Marland Kitchen LYCOPENE PO Take 1 tablet by mouth daily.       . potassium chloride SA (K-DUR,KLOR-CON) 20 MEQ tablet Take 1 tablet (20 mEq total) by mouth daily.  90 tablet  2  . torsemide (DEMADEX) 20 MG tablet Take 2  tablets (40 mg total) by mouth daily.  65 tablet  5  . VITAMIN E PO Take 1 capsule by mouth 2 (two) times daily.         Assessment: 80 yo male with Afib s/p embolectomy for heparin.  Received heparin 6000 units IV intraoperatively at ~2330  Goal of Therapy:  Heparin level 0.3-0.7 units/ml Monitor platelets by anticoagulation protocol: Yes   Plan:  Start heparin 1000 units/hr Check heparin level in 8 hours.  Caryl Pina 07/29/2013,3:00 AM

## 2013-07-29 NOTE — Evaluation (Signed)
Physical Therapy Evaluation Patient Details Name: LENO TASCA MRN: [id number] DOB: 07-Jul-1928 Today's Date: 07/29/2013 Time: [id number] PT Time Calculation (min): 27 min  PT Assessment / Plan / Recommendation History of Present Illness  BERLE VOLLRATH is a 80 y.o. male with a history of atrial fibrillation who developed the sudden onset of pain and paresthesias in the right foot. S/P R popliteal tibial embolectomy   Clinical Impression  Pt adm with the dx described above. Pt demonstrated safe function mobility and transisitions with the use of a RW and min guard. Pt plans to return home where he will be alone during the day while his wife is at work. Pt to benefit from skilled acute PT to improve functional mobility to mod I for safe D/C home.    PT Assessment  Patient needs continued PT services    Follow Up Recommendations  No PT follow up    Does the patient have the potential to tolerate intense rehabilitation      Barriers to Discharge Decreased caregiver support pt's wife works. pt will be alone during the day at home    Equipment Recommendations  Rolling walker with 5" wheels    Recommendations for Other Services     Frequency Min 3X/week    Precautions / Restrictions Precautions Precautions: Fall Restrictions Weight Bearing Restrictions: No   Pertinent Vitals/Pain Pt reports pain at the incision site      Mobility  Bed Mobility General bed mobility comments: pt up in chair upon PT arrival Transfers Overall transfer level: Modified independent Equipment used: Rolling walker (2 wheeled) Ambulation/Gait Ambulation/Gait assistance: Min guard Ambulation Distance (Feet): 350 Feet Assistive device: Rolling walker (2 wheeled) Gait Pattern/deviations: Step-through pattern;Decreased stride length Gait velocity: guarded for falls General Gait Details: Pt demonstrated safety with RW Stairs: Yes Stairs assistance: Min guard Stair Management: Step to pattern  (hand held assist) Number of Stairs: 1 General stair comments: hand held assist to simulate home set up with assist from wife    Exercises     PT Diagnosis: Difficulty walking;Acute pain  PT Problem List: Decreased mobility;Decreased knowledge of use of DME;Decreased strength;Decreased balance PT Treatment Interventions: DME instruction;Gait training;Stair training;Functional mobility training;Therapeutic activities;Therapeutic exercise;Balance training;Patient/family education     PT Goals(Current goals can be found in the care plan section) Acute Rehab PT Goals Patient Stated Goal: return home PT Goal Formulation: With patient Time For Goal Achievement: 08/05/13 Potential to Achieve Goals: Good  Visit Information  Last PT Received On: 07/29/13 Assistance Needed: +1 History of Present Illness: KRISTOFFER SKOLD is a 80 y.o. male with a history of atrial fibrillation who developed the sudden onset of pain and paresthesias in the right foot. S/P R popliteal tibial embolectomy        Prior Functioning  Home Living Family/patient expects to be discharged to:: Private residence Living Arrangements: Spouse/significant other Available Help at Discharge: Family;Available PRN/intermittently (pt's wife works during the day. pt is home alone during the ) Type of Home: House Home Access: Stairs to enter [employer] of Steps: 1 Entrance Stairs-Rails: None Home Layout: One level Home Equipment: None Additional Comments: His home has 3 STE but he enters through the garage which only has 1 STE Prior Function Level of Independence: Independent Communication Communication: No difficulties    Cognition  Cognition Arousal/Alertness: Awake/alert Behavior During Therapy: WFL for tasks assessed/performed Overall Cognitive Status: Within Functional Limits for tasks assessed    Extremity/Trunk Assessment Upper Extremity Assessment Upper Extremity Assessment: Overall [facility]  for tasks  assessed Lower Extremity Assessment Lower Extremity Assessment: Overall WFL for tasks assessed   Balance Balance Overall balance assessment: Needs assistance Sitting-balance support: No upper extremity supported Sitting balance-Leahy Scale: Good Standing balance support: Single extremity supported Standing balance-Leahy Scale: Good Standing balance comment: pt intermitently used RW for standing support. pt demonstrate ability to stand with out support for 1 min General Comments General comments (skin integrity, edema, etc.): pt reports pain in R LE at the incision site  End of Session PT - End of Session Equipment Utilized During Treatment: Gait belt (RW) Activity Tolerance: Patient tolerated treatment well Patient left: in chair;with call bell/phone within reach Nurse Communication: Mobility status  GP     Kingsley Callander SPT 07/29/2013, 11:33 AM  Agree with above assessment.  Kittie Plater, PT, DPT Pager #: [phone] Office #: [phone]

## 2013-07-29 NOTE — Op Note (Signed)
    NAME: Marcus [name]    MRN: [id number] DOB: 1928/11/10    DATE OF OPERATION: 07/29/2013  PREOP DIAGNOSIS: Acute arterial occlusion right lower extremity  POSTOP DIAGNOSIS: same  PROCEDURE: right popliteal and tibial embolectomy  SURGEON: Judeth Cornfield. Scot Dock, MD, FACS  ASSIST: none  ANESTHESIA: Gen.   EBL: 100 cc  INDICATIONS: Marcus [name] is a 80 y.o. male with a history of atrial fibrillation who developed the sudden onset of pain and paresthesias in the right foot and was noted to have no Doppler flow in the right foot. He was felt to likely have an embolus and was taken urgently to the operating room for popliteal embolectomy.  FINDINGS: it was organized clot in the popliteal artery and proximal tibial vessels.  TECHNIQUE: The patient was taken to the operating room and received a general anesthetic. The right lower extremity was prepped and draped in usual sterile fashion. As the patient had a popliteal pulse I elected to make the incision below the knee. A longitudinal incision was made below the knee. The dissection was carried down to the popliteal fossa and it popped artery dissected free. I divided the anterior tibial vein between 2-0 silk ties allowing mobilization of the proximal anterior tibial artery and tibial peroneal trunk. Once control was obtained the patient was heparinized. A transverse arteriotomy was made. There was organized clot present at the popliteal artery. This was retrieved using a #3 internal 4 for the catheter. The cath was passed multiple times proximally until no further clot was retrieved. The artery was flushed with heparinized saline and clamped. Distally the catheter was directed down the anterior tibial artery the tibial peroneal trunk until no further clot was retrieved. The catheter did pass approximately 30 cm in both the anterior tibial artery in the posterior tibial artery. Once no further clot was retrieved, the arteriotomy was closed  with running 5-0 Prolene suture. At the completion was a good posterior tibial and anterior tibial signal with the Doppler. A #15 Blake drain was placed. The heparin was not reversed. The wounds closed with 2 deep layers of 2-0 Vicryl and the skin closed with a subcutaneous 4-0 Vicryl. Dermabond was applied. The patient tolerated the procedure well and was transferred to the recovery room in stable condition. All needle and sponge counts were correct.  Deitra Mayo, MD, FACS [facility]  DATE OF DICTATION:   07/29/2013

## 2013-07-29 NOTE — Progress Notes (Signed)
Report called, pt transferring to 2W04 via w/c with belongings and meds.

## 2013-07-29 NOTE — Progress Notes (Signed)
ANTICOAGULATION CONSULT NOTE   Pharmacy Consult for Heparin Indication: Afib, RLE thrombus s/p embolectomy  No Known Allergies  Patient Measurements: Height: 5\' 8"  (172.7 cm) Weight: 157 lb 3 oz (71.3 kg) IBW/kg (Calculated) : 68.4  Vital Signs: Temp: 98.6 F (37 C) (03/11 2124) Temp src: Oral (03/11 2124) BP: 95/58 mmHg (03/11 2124) Pulse Rate: 60 (03/11 2124)  Labs:  Recent Labs  07/28/13 2137 07/29/13 1145 07/29/13 2135  HGB 11.9*  --  10.2*  HCT 36.5*  --  30.8*  PLT 172  --  140*  APTT  --  69*  --   HEPARINUNFRC  --  0.10* 0.28*  CREATININE 1.19  --   --   CKTOTAL 85  --   --     Estimated Creatinine Clearance: 46.3 ml/min (by C-G formula based on Cr of 1.19).   Medical History: Past Medical History  Diagnosis Date  . Coronary artery disease     a. s/p remote CABG x 2(VG->OM, LIMA->LAD;  b. Late 90's s/p PCI of RCA;  c. 12/09 Cath/PCI: LM 58m, 70-80d (3.0x48mm Xience DES), LAD100p, LCX 80-90p (2.25x63mmTaxus Atom DES), RCA 100d (2.5x20mm Xience DES), VG->OM 100, LIMA->LAD nl, EF 30-35%.  . Hyperlipidemia   . Prostate cancer   . PAF (paroxysmal atrial fibrillation)   . Ischemic cardiomyopathy     a. EF 35-40% by echo 05/2012  . Symptomatic bradycardia     a. 08/2003 s/p MDT Enpulse [id number] Dual chamber PPM ser # [id number] H.  . Systolic CHF     Medications:  Prescriptions prior to admission  Medication Sig Dispense Refill  . Ascorbic Acid (VITAMIN C PO) Take 1 tablet by mouth daily.       Marland Kitchen CALCIUM PO Take 1 tablet by mouth daily.       . Cholecalciferol (VITAMIN D PO) Take 1 capsule by mouth daily.       . Coenzyme Q10 (COQ10 PO) Take 1 tablet by mouth daily.       . fish oil-omega-3 fatty acids 1000 MG capsule Take 1 g by mouth daily.       Marland Kitchen LYCOPENE PO Take 1 tablet by mouth daily.       . potassium chloride SA (K-DUR,KLOR-CON) 20 MEQ tablet Take 1 tablet (20 mEq total) by mouth daily.  90 tablet  2  . torsemide (DEMADEX) 20 MG tablet Take 2 tablets  (40 mg total) by mouth daily.  65 tablet  5  . VITAMIN E PO Take 1 capsule by mouth 2 (two) times daily.         Assessment: 80 yo male with Afib s/p embolectomy for heparin.  Heparin drip 1200 uts/hr heparin level is 0.28- slightly less than goal.   No bleeding reported.   Goal of Therapy:  Heparin level 0.3-0.7 units/ml Monitor platelets by anticoagulation protocol: Yes   Plan:  Increase heparin to 1300 units/hr Daily HL, CBC  Bonnita Nasuti Pharm.D. CPP, BCPS Clinical Pharmacist [phone] 07/29/2013 10:18 PM

## 2013-07-29 NOTE — Progress Notes (Addendum)
[facility] Progress Note  07/29/2013 7:45 AM 1 Day Post-Op  Subjective:  Foot feels better; soreness at incision  Afebrile HR 60's-60's [id number] systolic (since surgery) [id number] RA  Filed Vitals:   07/29/13 0730  BP: 103/64  Pulse: 60  Temp:   Resp: 10    Physical Exam: Incisions:  C/d/i  Extremities:  1+ right DP; 2+ right PT; right calf is soft  CBC    Component Value Date/Time   WBC 4.5 07/28/2013 2137   RBC 4.35 07/28/2013 2137   RBC 3.46* 06/29/2012 0752   HGB 11.9* 07/28/2013 2137   HCT 36.5* 07/28/2013 2137   PLT 172 07/28/2013 2137   MCV 83.9 07/28/2013 2137   MCH 27.4 07/28/2013 2137   MCHC 32.6 07/28/2013 2137   RDW 17.5* 07/28/2013 2137   LYMPHSABS 0.8 07/28/2013 2137   MONOABS 0.5 07/28/2013 2137   EOSABS 0.1 07/28/2013 2137   BASOSABS 0.0 07/28/2013 2137    BMET    Component Value Date/Time   NA 141 07/28/2013 2137   K 3.4* 07/28/2013 2137   CL 98 07/28/2013 2137   CO2 28 07/28/2013 2137   GLUCOSE 93 07/28/2013 2137   BUN 29* 07/28/2013 2137   CREATININE 1.19 07/28/2013 2137   CALCIUM 8.9 07/28/2013 2137   GFRNONAA 55* 07/28/2013 2137   GFRAA 64* 07/28/2013 2137    INR    Component Value Date/Time   INR 1.47 06/28/2012 1044     Intake/Output Summary (Last 24 hours) at 07/29/13 0745 Last data filed at 07/29/13 0730  Gross per 24 hour  Intake   2910 ml  Output    875 ml  Net   2035 ml     Assessment:  80 y.o. male is s/p:  right popliteal and tibial embolectomy  1 Day Post-Op  Plan: -pt with palpable right pedal pulses -continue heparin gtt -DVT prophylaxis:  Heparin gtt -pt may need echo -will get cardiology consult today as pt sees Dr. Weldon Picking, PA-C [facility] [phone] 07/29/2013 7:45 AM    Agree with above. 2D echo Cardiac evaluation Transfer to 2000.  Deitra Mayo, MD, FACS Beeper [phone] 07/29/2013

## 2013-07-29 NOTE — Progress Notes (Signed)
ANTICOAGULATION CONSULT NOTE   Pharmacy Consult for Heparin Indication: Afib, RLE thrombus s/p embolectomy  No Known Allergies  Patient Measurements: Height: 5\' 8"  (172.7 cm) Weight: 157 lb 3 oz (71.3 kg) IBW/kg (Calculated) : 68.4  Vital Signs: Temp: 97.5 F (36.4 C) (03/11 0730) Temp src: Oral (03/11 0730) BP: 103/64 mmHg (03/11 0730) Pulse Rate: 60 (03/11 0730)  Labs:  Recent Labs  07/28/13 2137 07/29/13 1145  HGB 11.9*  --   HCT 36.5*  --   PLT 172  --   APTT  --  69*  HEPARINUNFRC  --  0.10*  CREATININE 1.19  --   CKTOTAL 85  --     Estimated Creatinine Clearance: 46.3 ml/min (by C-G formula based on Cr of 1.19).   Medical History: Past Medical History  Diagnosis Date  . Coronary artery disease     a. s/p remote CABG x 2(VG->OM, LIMA->LAD;  b. Late 90's s/p PCI of RCA;  c. 12/09 Cath/PCI: LM 40m, 70-80d (3.0x49mm Xience DES), LAD100p, LCX 80-90p (2.25x27mmTaxus Atom DES), RCA 100d (2.5x62mm Xience DES), VG->OM 100, LIMA->LAD nl, EF 30-35%.  . Hyperlipidemia   . Prostate cancer   . PAF (paroxysmal atrial fibrillation)   . Ischemic cardiomyopathy     a. EF 35-40% by echo 05/2012  . Symptomatic bradycardia     a. 08/2003 s/p MDT Enpulse E2DR01 Dual chamber PPM ser # [id number] H.  . Systolic CHF     Medications:  Prescriptions prior to admission  Medication Sig Dispense Refill  . Ascorbic Acid (VITAMIN C PO) Take 1 tablet by mouth daily.       Marland Kitchen CALCIUM PO Take 1 tablet by mouth daily.       . Cholecalciferol (VITAMIN D PO) Take 1 capsule by mouth daily.       . Coenzyme Q10 (COQ10 PO) Take 1 tablet by mouth daily.       . fish oil-omega-3 fatty acids 1000 MG capsule Take 1 g by mouth daily.       Marland Kitchen LYCOPENE PO Take 1 tablet by mouth daily.       . potassium chloride SA (K-DUR,KLOR-CON) 20 MEQ tablet Take 1 tablet (20 mEq total) by mouth daily.  90 tablet  2  . torsemide (DEMADEX) 20 MG tablet Take 2 tablets (40 mg total) by mouth daily.  65 tablet  5  .  VITAMIN E PO Take 1 capsule by mouth 2 (two) times daily.         Assessment: 80 yo male with Afib s/p embolectomy for heparin.  Heparin level is 0.1 units/ml.  No issues noted with heparin.  No bleeding reported.   Goal of Therapy:  Heparin level 0.3-0.7 units/ml Monitor platelets by anticoagulation protocol: Yes   Plan:  Increase heparin to 1200 units/hr Check heparin level in 8 hours.  Excell Seltzer Poteet 07/29/2013,2:02 PM

## 2013-07-29 NOTE — Consult Note (Signed)
Reason for Consult: Cardioembolic event  Requesting Physician: Scot Dock  Cardiologist: Nahser  HPI: This is a 80 y.o. male with a past medical history significant for Coronary artery disease- status post CABG and PCI, dual chamber pacemaker placement ([employer]tronic,2005, generator changed April 2014), ischemic cardiopathy with an ejection fraction of 35-40%, remote history of atrial flutter ablation, now with frequent atrial fibrillation (84% burden by January device check) who presented with an ischemic right lower extremity and required embolectomy yesterday evening. He is feeling substantially better and his symptoms of limb pain and loss of sensation have essentially resolved.  He had stopped taking his anticoagulants roughly a month earlier. He has no real reason for this. He has not had bleeding complications or other side effects. There was no issue of excessive medication costs. He really can't explain why he is no longer taking it and still has a few tablets left in the bottle.  NYHA functional class II status chronically. Last hospitalization for congestive heart failure was in November of 2014. Does not have angina pectoris. Last coronary and her grams performed in 2009, when he had extensive percutaneous revascularization of the right coronary artery, oblique marginal artery and left main coronary artery (occluded vein graft, patent LIMA).  His last pacemaker check was a remote monitor evaluation in January of this year that showed an overall burden of atrial arrhythmia of 84% as well as a high frequency ventricular pacing at 89%.  s to the OM and RCA PMHx:  Past Medical History  Diagnosis Date  . Coronary artery disease     a. s/p remote CABG x 2(VG->OM, LIMA->LAD;  b. Late 90's s/p PCI of RCA;  c. 12/09 Cath/PCI: LM 6m, 70-80d (3.0x26mm Xience DES), LAD100p, LCX 80-90p (2.25x34mmTaxus Atom DES), RCA 100d (2.5x38mm Xience DES), VG->OM 100, LIMA->LAD nl, EF 30-35%.  .  Hyperlipidemia   . Prostate cancer   . PAF (paroxysmal atrial fibrillation)   . Ischemic cardiomyopathy     a. EF 35-40% by echo 05/2012  . Symptomatic bradycardia     a. 08/2003 s/p MDT Enpulse [id number] Dual chamber PPM ser # [id number] H.  . Systolic CHF    Past Surgical History  Procedure Laterality Date  . Coronary artery bypass graft    . Insert / replace / remove pacemaker    . Doppler echocardiography  06/24/2002    EF 60-65%  . Tee without cardioversion  06/26/2012    Procedure: TRANSESOPHAGEAL ECHOCARDIOGRAM (TEE);  Surgeon: Lelon Perla, MD;  Location: [facility];  Service: Cardiovascular;  Laterality: [name];  . Cardioversion  06/26/2012    Procedure: CARDIOVERSION;  Surgeon: Lelon Perla, MD;  Location: [place];  Service: Cardiovascular;  Laterality: [name];  . Appendectomy    . Angioplasty      stent placement    FAMHx: Family History  Problem Relation Age of Onset  . Hypertension Mother   . Colon cancer Mother   . Alcohol abuse Father     SOCHx:  reports that he quit smoking about 42 years ago. He has never used smokeless tobacco. He reports that he does not drink alcohol or use illicit drugs.  ALLERGIES: No Known Allergies  ROS: The patient specifically denies any recent chest pain at rest or with exertion, dyspnea at rest (has class II dyspnea with exertion), orthopnea, paroxysmal nocturnal dyspnea, syncope, palpitations, focal neurological deficits, intermittent claudication, lower extremity edema, unexplained weight gain, cough, hemoptysis or wheezing.  The patient also denies abdominal pain,  nausea, vomiting, dysphagia, diarrhea, constipation, polyuria, polydipsia, dysuria, hematuria, frequency, urgency, abnormal bleeding or bruising, fever, chills, unexpected weight changes, mood swings, change in skin or hair texture, change in voice quality, auditory or visual problems, allergic reactions or rashes, new musculoskeletal complaints other than usual "aches and  pains".   HOME MEDICATIONS: Prescriptions prior to admission  Medication Sig Dispense Refill  . Ascorbic Acid (VITAMIN C PO) Take 1 tablet by mouth daily.       Marland Kitchen CALCIUM PO Take 1 tablet by mouth daily.       . Cholecalciferol (VITAMIN D PO) Take 1 capsule by mouth daily.       . Coenzyme Q10 (COQ10 PO) Take 1 tablet by mouth daily.       . fish oil-omega-3 fatty acids 1000 MG capsule Take 1 g by mouth daily.       Marland Kitchen LYCOPENE PO Take 1 tablet by mouth daily.       . potassium chloride SA (K-DUR,KLOR-CON) 20 MEQ tablet Take 1 tablet (20 mEq total) by mouth daily.  90 tablet  2  . torsemide (DEMADEX) 20 MG tablet Take 2 tablets (40 mg total) by mouth daily.  65 tablet  5  . VITAMIN E PO Take 1 capsule by mouth 2 (two) times daily.         HOSPITAL MEDICATIONS: I have reviewed the patient's current medications. Scheduled: . carvedilol  3.125 mg Oral BID WC  .  ceFAZolin (ANCEF) IV  1 g Intravenous On Call  . cefUROXime (ZINACEF)  IV  1.5 g Intravenous Q12H  . [START ON 07/30/2013] docusate sodium  100 mg Oral Daily  . omega-3 acid ethyl esters  1 g Oral Daily  . pantoprazole  40 mg Oral Daily  . potassium chloride SA  20 mEq Oral Daily  . torsemide  40 mg Oral Daily   Continuous: . 0.9 % NaCl with KCl 20 mEq / L 75 mL/hr at 07/29/13 0246  . DOPamine    . heparin 1,000 Units/hr (07/29/13 0700)    VITALS: Blood pressure 103/64, pulse 60, temperature 97.5 F (36.4 C), temperature source Oral, resp. rate 10, height 5\' 8"  (1.727 m), weight 71.3 kg (157 lb 3 oz), SpO2 98.00%.  PHYSICAL EXAM:  General: Alert, oriented x3, no distress Head: no evidence of trauma, PERRL, EOMI, no exophtalmos or lid lag, no myxedema, no xanthelasma; normal ears, nose and oropharynx Neck: Normal jugular venous pulsations and no hepatojugular reflux; brisk carotid pulses without delay and no carotid bruits Chest: clear to auscultation, no signs of consolidation by percussion or palpation, normal  fremitus, symmetrical and full respiratory excursions Cardiovascular: normal position and quality of the apical impulse, irregular rhythm, normal first heart sound and normal second heart sound, no rubs or gallops, no murmur Abdomen: no tenderness or distention, no masses by palpation, no abnormal pulsatility or arterial bruits, normal bowel sounds, no hepatosplenomegaly Extremities: no clubbing, cyanosis;  no edema; 2+ radial, ulnar and brachial pulses bilaterally; 2+ right femoral, posterior tibial and dorsalis pedis pulses; 2+ left femoral, posterior tibial and dorsalis pedis pulses; no subclavian or femoral bruits Neurological: grossly nonfocal   LABS  CBC  Recent Labs  07/28/13 2137  WBC 4.5  NEUTROABS 3.1  HGB 11.9*  HCT 36.5*  MCV 83.9  PLT [id number]   Basic Metabolic Panel  Recent Labs  07/28/13 2137  NA 141  K 3.4*  CL 98  CO2 28  GLUCOSE 93  BUN 29*  CREATININE  1.19  CALCIUM 8.9   Liver Function Tests No results found for this basename: AST, ALT, ALKPHOS, BILITOT, PROT, ALBUMIN,  in the last 72 hours No results found for this basename: LIPASE, AMYLASE,  in the last 72 hours Cardiac Enzymes  Recent Labs  07/28/13 2137  CKTOTAL 85    IMAGING: No results found.  ECG: Atrial fibrillation with rare ventricular pacing, LVH with QRS widening; very prominent ST depression and T-wave inversion could represent "memory T waves", QTC 506 ms  TELEMETRY:  mostly ventricular paced rhythm with background atrial fibrillation; no high ventricular rates  IMPRESSION: 1. Right popliteal artery embolism almost certainly related to atrial fibrillation and noncompliance with chronic anticoagulation 2. Atrial fibrillation with slow ventricular response; recent marked increase in arrhythmia burden as well as increase in ventricular pacing 3. Chronic systolic heart failure due to ischemic cardiomyopathy, currently clinically euvolemic 4. CAD of the native and graft vessels,  currently asymptomatic 5. Mild hypokalemia 6. Prolonged QT interval  RECOMMENDATION: 1. Spent a long time reinforcing the purpose of anticoagulation therapy and the risks of noncompliance; his risk of future embolic events and stroke is extremely high in the setting of depressed left ventricular systolic function, recurrent persistent atrial fibrillation, advanced age and history of embolic events. Anticoagulants should only be stopped for truly life-threatening hemorrhagic events and major surgical procedures. 2. Note is made of the increased frequency of atrial fibrillation and increased ventricular pacing. Fortunately this has not led to worsening heart failure symptoms. He may be a candidate for cardiac resynchronization pacemaker upgrade in the future. Antiarrhythmic therapy does not appear to be justified at this time 3. He was on a tiny dose of carvedilol and is not receiving beta blockers - this might be because she has a tendency to low blood pressure; will discuss with Dr. Acie Fredrickson. 4. Replace potassium 5. Avoid any QT prolonging drugs  Time Spent Directly with Patient: 60 minutes  Sanda Klein, MD, [facility] [phone] office [phone] pager   07/29/2013, 11:44 AM

## 2013-07-29 NOTE — Transfer of Care (Signed)
Immediate Anesthesia Transfer of Care Note  Patient: Marcus [name]  Procedure(s) Performed: Procedure(s): Right Popliteal and Tibial Embolectomy (Right)  Patient Location: PACU  Anesthesia Type:General  Level of Consciousness: responds to stimulation  Airway & Oxygen Therapy: Patient Spontanous Breathing and Patient connected to nasal cannula oxygen  Post-op Assessment: Report given to PACU RN and Post -op Vital signs reviewed and stable  Post vital signs: Reviewed and stable  Complications: No apparent anesthesia complications

## 2013-07-29 NOTE — Progress Notes (Signed)
*  PRELIMINARY RESULTS* Echocardiogram 2D Echocardiogram has been performed.  Marcus [name] 07/29/2013, 3:59 PM

## 2013-07-30 ENCOUNTER — Encounter (HOSPITAL_COMMUNITY): Payer: Self-pay | Admitting: Vascular Surgery

## 2013-07-30 DIAGNOSIS — I10 Essential (primary) hypertension: Secondary | ICD-10-CM | POA: Diagnosis not present

## 2013-07-30 DIAGNOSIS — I999 Unspecified disorder of circulatory system: Secondary | ICD-10-CM | POA: Diagnosis not present

## 2013-07-30 DIAGNOSIS — I5022 Chronic systolic (congestive) heart failure: Secondary | ICD-10-CM | POA: Diagnosis not present

## 2013-07-30 DIAGNOSIS — I4891 Unspecified atrial fibrillation: Secondary | ICD-10-CM | POA: Diagnosis not present

## 2013-07-30 DIAGNOSIS — I743 Embolism and thrombosis of arteries of the lower extremities: Secondary | ICD-10-CM | POA: Diagnosis present

## 2013-07-30 LAB — CBC
HCT: 31.7 % — ABNORMAL LOW (ref 39.0–52.0)
Hemoglobin: 10 g/dL — ABNORMAL LOW (ref 13.0–17.0)
MCH: 26.4 pg (ref 26.0–34.0)
MCHC: 31.5 g/dL (ref 30.0–36.0)
MCV: 83.6 fL (ref 78.0–100.0)
Platelets: 152 10*3/uL (ref 150–400)
RBC: 3.79 MIL/uL — AB (ref 4.22–5.81)
RDW: 17.9 % — AB (ref 11.5–15.5)
WBC: 4.5 10*3/uL (ref 4.0–10.5)

## 2013-07-30 LAB — HEPARIN LEVEL (UNFRACTIONATED): Heparin Unfractionated: 0.25 IU/mL — ABNORMAL LOW (ref 0.30–0.70)

## 2013-07-30 MED ORDER — APIXABAN 5 MG PO TABS
5.0000 mg | ORAL_TABLET | Freq: Two times a day (BID) | ORAL | Status: DC
  Administered 2013-07-30 – 2013-07-31 (×3): 5 mg via ORAL
  Filled 2013-07-30 (×4): qty 1

## 2013-07-30 NOTE — Anesthesia Postprocedure Evaluation (Deleted)
  Anesthesia Post-op Note  Patient: Marcus [name]  Procedure(s) Performed: Procedure(s): Right Popliteal and Tibial Embolectomy (Right)  Patient Location: Nursing Unit  Anesthesia Type:General  Level of Consciousness: awake, alert  and oriented  Airway and Oxygen Therapy: Patient Spontanous Breathing and Patient connected to nasal cannula oxygen  Post-op Pain: none  Post-op Assessment: Post-op Vital signs reviewed, Patient's Cardiovascular Status Stable, Respiratory Function Stable, Patent Airway, No signs of Nausea or vomiting, Adequate PO intake and Pain level controlled  Post-op Vital Signs: Reviewed and stable  Complications: No apparent anesthesia complications

## 2013-07-30 NOTE — Progress Notes (Signed)
   VASCULAR PROGRESS NOTE  SUBJECTIVE: No complaints.   PHYSICAL EXAM: Filed Vitals:   07/29/13 1300 07/29/13 1729 07/29/13 2124 07/30/13 0512  BP: 90/49 99/59 95/58  104/59  Pulse: 59 59 60 65  Temp: 97.3 F (36.3 C)  98.6 F (37 C) 98.6 F (37 C)  TempSrc: Oral  Oral Oral  Resp:   18 16  Height:      Weight:      SpO2: 100%  98% 96%   Palpable Right PT pulse Incision looks fine. JP 10 cc  LABS: Lab Results  Component Value Date   WBC 4.5 07/30/2013   HGB 10.0* 07/30/2013   HCT 31.7* 07/30/2013   MCV 83.6 07/30/2013   PLT 152 07/30/2013   Lab Results  Component Value Date   CREATININE 1.13 07/29/2013   Lab Results  Component Value Date   INR 1.47 06/28/2012   CBG (last 3)  No results found for this basename: GLUCAP,  in the last 72 hours  Active Problems:   Ischemic leg   ASSESSMENT AND PLAN:  * 2 Days Post-Op s/p: Right popliteal and tibial embolectomy  * Appreciate Cardiology's help. They recommend continued anticoagulation if the patient is agreeable. I spoke with the patient and he is agreeable. I will let cardiology decide which anticoagulant is safest for him.   *  On IV heparin, being managed by pharmacy.   * Home when anticoagulation worked out.   * D/C JP.   Gae Gallop [id number] 07/30/2013

## 2013-07-30 NOTE — Progress Notes (Signed)
Physical Therapy Treatment Patient Details Name: Marcus [name] MRN: [id number] DOB: [birth year] Today's Date: 07/30/2013 Time: 4497-5300 PT Time Calculation (min): 15 min  PT Assessment / Plan / Recommendation  History of Present Illness Marcus [name] is a 80 y.o. male with a history of atrial fibrillation who developed the sudden onset of pain and paresthesias in the right foot. S/P R popliteal tibial embolectomy    PT Comments   Pt shows safe and independent level of mobility.  Discussed regular movement/ambulation and A/P and activity like LAQ when sedentary.  Wife and pt verbalize understanding.   Follow Up Recommendations  No PT follow up     Does the patient have the potential to tolerate intense rehabilitation     Barriers to Discharge        Equipment Recommendations   (may decide to get RW due to continued pain)    Recommendations for Other Services    Frequency Min 3X/week   Progress towards PT Goals Progress towards PT goals: Goals met/education completed, patient discharged from PT  Plan      Precautions / Restrictions Precautions Precautions:  (minimal fall risk) Restrictions Weight Bearing Restrictions: No   Pertinent Vitals/Pain     Mobility  Bed Mobility Overal bed mobility: Modified Independent Transfers Overall transfer level: Modified independent Equipment used: Rolling walker (2 wheeled) Ambulation/Gait Ambulation/Gait assistance: Supervision (Independent in room) Ambulation Distance (Feet): 600 Feet Assistive device: None Gait Pattern/deviations: WFL(Within Functional Limits);Drifts right/left Gait velocity interpretation: at or above normal speed for age/gender General Gait Details: Generally steady with no device.  Wanders a bit, but no safety issues Stairs: Yes Stairs assistance: Modified independent (Device/Increase time) Stair Management: One rail Right;Alternating pattern;Forwards Number of Stairs: 12 General stair comments: safe  technique    Exercises     PT Diagnosis:    PT Problem List:   PT Treatment Interventions:     PT Goals (current goals can now be found in the care plan section) Acute Rehab PT Goals Patient Stated Goal: return home PT Goal Formulation: With patient Time For Goal Achievement: 08/05/13 Potential to Achieve Goals: Good  Visit Information  Last PT Received On: 07/30/13 Assistance Needed: +1 History of Present Illness: Marcus [name] is a 80 y.o. male with a history of atrial fibrillation who developed the sudden onset of pain and paresthesias in the right foot. S/P R popliteal tibial embolectomy     Subjective Data  Patient Stated Goal: return home   Cognition  Cognition Arousal/Alertness: Awake/alert Behavior During Therapy: WFL for tasks assessed/performed Overall Cognitive Status: Within Functional Limits for tasks assessed    Balance  Balance Overall balance assessment: Needs assistance Sitting balance-Leahy Scale: Normal Standing balance-Leahy Scale: Good High level balance activites: Backward walking;Direction changes;Turns;Head turns High Level Balance Comments: no overt gait deviations with higher level balance challenges.  End of Session PT - End of Session Activity Tolerance: Patient tolerated treatment well Patient left: in bed;with call bell/phone within reach Nurse Communication: Mobility status   GP     Talton Delpriore, Tessie Fass 07/30/2013, 1:44 PM 07/30/2013  Donnella Sham, [place] [phone]  (pager)

## 2013-07-30 NOTE — Care Management Note (Signed)
    Page 1 of 1   07/30/2013     3:14:34 PM   CARE MANAGEMENT NOTE 07/30/2013  Patient:  Marcus [name], Marcus [name]   Account Number:  [id number]  Date Initiated:  07/30/2013  Documentation initiated by:  Trissa Molina  Subjective/Objective Assessment:   PT ADM S/P RT POPLITEAL TIBIAL EMBOLECTOMY.  PTA, PT INDEPENDENT, LIVES WITH SPOUSE.     Action/Plan:   WILL FOLLOW FOR DC NEEDS AS PT PROGRESSES.  P.T. RECOMMENDING NO FOLLOW UP.   Anticipated DC Date:  08/01/2013   Anticipated DC Plan:  [place]  CM consult      Choice offered to / List presented to:             Status of service:  In process, will continue to follow Medicare Important Message given?   (If response is "NO", the following Medicare IM given date fields will be blank) Date Medicare IM given:   Date Additional Medicare IM given:    Discharge Disposition:    Per UR Regulation:  Reviewed for med. necessity/level of care/duration of stay  If discussed at [place] of Stay Meetings, dates discussed:    Comments:

## 2013-07-30 NOTE — Anesthesia Postprocedure Evaluation (Signed)
  Anesthesia Post-op Note  Patient: Marcus [name]  Procedure(s) Performed: Procedure(s): Right Popliteal and Tibial Embolectomy (Right)  Patient Location: PACU  Anesthesia Type:General  Level of Consciousness: awake, oriented, sedated and patient cooperative  Airway and Oxygen Therapy: Patient Spontanous Breathing  Post-op Pain: mild  Post-op Assessment: Post-op Vital signs reviewed, Patient's Cardiovascular Status Stable, Respiratory Function Stable, Patent Airway, No signs of Nausea or vomiting and Pain level controlled  Post-op Vital Signs: stable  Complications: No apparent anesthesia complications

## 2013-07-30 NOTE — Progress Notes (Signed)
ANTICOAGULATION CONSULT NOTE Pharmacy Consult for Heparin Indication: Afib, RLE thrombus s/p embolectomy  No Known Allergies  Patient Measurements: Height: 5\' 8"  (172.7 cm) Weight: 157 lb 3 oz (71.3 kg) IBW/kg (Calculated) : 68.4  Vital Signs: Temp: 98.6 F (37 C) (03/11 2124) Temp src: Oral (03/11 2124) BP: 95/58 mmHg (03/11 2124) Pulse Rate: 60 (03/11 2124)  Labs:  Recent Labs  07/28/13 2137 07/29/13 1145 07/29/13 2135 07/30/13 0308  HGB 11.9*  --  10.2* 10.0*  HCT 36.5*  --  30.8* 31.7*  PLT 172  --  140* 152  APTT  --  69*  --   --   HEPARINUNFRC  --  0.10* 0.28* 0.25*  CREATININE 1.19  --  1.13  --   CKTOTAL 85  --   --   --     Estimated Creatinine Clearance: 48.8 ml/min (by C-G formula based on Cr of 1.13).   Assessment: 80 yo male with Afib s/p embolectomy for heparin.    Goal of Therapy:  Heparin level 0.3-0.7 units/ml Monitor platelets by anticoagulation protocol: Yes   Plan:  Increase Heparin 1350 units/hr Check heparin level in 8 hours.   Caryl Pina 07/30/2013,4:25 AM

## 2013-07-30 NOTE — Progress Notes (Signed)
DAILY PROGRESS NOTE  Subjective:  No complaints. JP drain in right leg with some bloody fluid. Right leg is WWP. He denies chest pain or shortness of breath. Mild cough.  Objective:  Temp:  [97.3 F (36.3 C)-98.6 F (37 C)] 98.6 F (37 C) (03/12 0512) Pulse Rate:  [59-65] 65 (03/12 0512) Resp:  [16-18] 16 (03/12 0512) BP: (90-104)/(49-59) 104/59 mmHg (03/12 0512) SpO2:  [96 %-100 %] 96 % (03/12 0512) Weight change:   Intake/Output from previous day: 03/11 0701 - 03/12 0700 In: 975 [P.O.:720; I.V.:255] Out: 535 [Urine:525; Drains:10]  Intake/Output from this shift:    Medications: Current Facility-Administered Medications  Medication Dose Route Frequency Provider Last Rate Last Dose  . 0.9 % NaCl with KCl 20 mEq/ L  infusion   Intravenous Continuous Angelia Mould, MD 75 mL/hr at 07/30/13 [phone]    . acetaminophen (TYLENOL) tablet 325-650 mg  325-650 mg Oral Q4H PRN Angelia Mould, MD       Or  . acetaminophen (TYLENOL) suppository 325-650 mg  325-650 mg Rectal Q4H PRN Angelia Mould, MD      . acetaminophen (TYLENOL) tablet 325-650 mg  325-650 mg Oral Q4H PRN Angelia Mould, MD       Or  . acetaminophen (TYLENOL) suppository 325-650 mg  325-650 mg Rectal Q4H PRN Angelia Mould, MD      . alum & mag hydroxide-simeth (MAALOX/MYLANTA) 200-200-20 MG/5ML suspension 15-30 mL  15-30 mL Oral Q2H PRN Angelia Mould, MD      . carvedilol (COREG) tablet 3.125 mg  3.125 mg Oral BID WC Angelia Mould, MD   3.125 mg at 07/29/13 1729  . docusate sodium (COLACE) capsule 100 mg  100 mg Oral Daily Angelia Mould, MD      . DOPamine (INTROPIN) 800 mg in dextrose 5 % 250 mL infusion  3-5 mcg/kg/min Intravenous Continuous Angelia Mould, MD      . guaiFENesin-dextromethorphan (ROBITUSSIN DM) 100-10 MG/5ML syrup 15 mL  15 mL Oral Q4H PRN Angelia Mould, MD      . heparin ADULT infusion 100 units/mL (25000 units/250 mL)  1,350  Units/hr Intravenous Continuous Angelia Mould, MD 13.5 mL/hr at 07/30/13 0432 1,350 Units/hr at 07/30/13 0432  . hydrALAZINE (APRESOLINE) injection 10 mg  10 mg Intravenous Q2H PRN Angelia Mould, MD      . HYDROmorphone (DILAUDID) injection 0.5-1 mg  0.5-1 mg Intravenous Q2H PRN Angelia Mould, MD   1 mg at 07/29/13 1938  . labetalol (NORMODYNE,TRANDATE) injection 10 mg  10 mg Intravenous Q2H PRN Angelia Mould, MD      . magnesium sulfate IVPB 2 g 50 mL  2 g Intravenous Daily PRN Angelia Mould, MD      . metoprolol (LOPRESSOR) injection 2-5 mg  2-5 mg Intravenous Q2H PRN Angelia Mould, MD      . omega-3 acid ethyl esters (LOVAZA) capsule 1 g  1 g Oral Daily Angelia Mould, MD   1 g at 07/29/13 0939  . ondansetron (ZOFRAN) injection 4 mg  4 mg Intravenous Q6H PRN Angelia Mould, MD      . oxyCODONE-acetaminophen (PERCOCET/ROXICET) 5-325 MG per tablet 1-2 tablet  1-2 tablet Oral Q4H PRN Angelia Mould, MD   1 tablet at 07/29/13 0358  . pantoprazole (PROTONIX) EC tablet 40 mg  40 mg Oral Daily Angelia Mould, MD   40 mg at 07/29/13 0939  .  phenol (CHLORASEPTIC) mouth spray 1 spray  1 spray Mouth/Throat PRN Angelia Mould, MD      . potassium chloride SA (K-DUR,KLOR-CON) CR tablet 20 mEq  20 mEq Oral Daily Angelia Mould, MD   20 mEq at 07/29/13 0939  . potassium chloride SA (K-DUR,KLOR-CON) CR tablet 20-40 mEq  20-40 mEq Oral Daily PRN Angelia Mould, MD      . torsemide [facility]) tablet 40 mg  40 mg Oral Daily Angelia Mould, MD   40 mg at 07/29/13 2229    Physical Exam: General appearance: alert and no distress Lungs: diminished breath sounds bilaterally and wheezes faint upper airway Heart: regular rate and rhythm, S1, S2 normal, no murmur, click, rub or gallop Abdomen: soft, non-tender; bowel sounds normal; no masses,  no organomegaly Extremities: extremities normal, atraumatic, no cyanosis or  edema and JP drain in right lower leg Pulses: 2+ and symmetric  Lab Results: Results for orders placed during the hospital encounter of 07/28/13 (from the past 48 hour(s))  CBC WITH DIFFERENTIAL     Status: Abnormal   Collection Time    07/28/13  9:37 PM      Result Value Ref Range   WBC 4.5  4.0 - 10.5 K/uL   RBC 4.35  4.22 - 5.81 MIL/uL   Hemoglobin 11.9 (*) 13.0 - 17.0 g/dL   HCT 36.5 (*) 39.0 - 52.0 %   MCV 83.9  78.0 - 100.0 fL   MCH 27.4  26.0 - 34.0 pg   MCHC 32.6  30.0 - 36.0 g/dL   RDW 17.5 (*) 11.5 - 15.5 %   Platelets 172  150 - 400 K/uL   Neutrophils Relative % 70  43 - 77 %   Neutro Abs 3.1  1.7 - 7.7 K/uL   Lymphocytes Relative 17  12 - 46 %   Lymphs Abs 0.8  0.7 - 4.0 K/uL   Monocytes Relative 11  3 - 12 %   Monocytes Absolute 0.5  0.1 - 1.0 K/uL   Eosinophils Relative 2  0 - 5 %   Eosinophils Absolute 0.1  0.0 - 0.7 K/uL   Basophils Relative 0  0 - 1 %   Basophils Absolute 0.0  0.0 - 0.1 K/uL  BASIC METABOLIC PANEL     Status: Abnormal   Collection Time    07/28/13  9:37 PM      Result Value Ref Range   Sodium 141  137 - 147 mEq/L   Potassium 3.4 (*) 3.7 - 5.3 mEq/L   Chloride 98  96 - 112 mEq/L   CO2 28  19 - 32 mEq/L   Glucose, Bld 93  70 - 99 mg/dL   BUN 29 (*) 6 - 23 mg/dL   Creatinine, Ser 1.19  0.50 - 1.35 mg/dL   Calcium 8.9  8.4 - 10.5 mg/dL   GFR calc non Af Amer 55 (*) >90 mL/min   GFR calc Af Amer 64 (*) >90 mL/min   Comment: (NOTE)     The eGFR has been calculated using the CKD EPI equation.     This calculation has not been validated in all clinical situations.     eGFR's persistently <90 mL/min signify possible Chronic Kidney     Disease.  CK     Status: None   Collection Time    07/28/13  9:37 PM      Result Value Ref Range   Total CK 85  7 - 232 U/L  I-STAT CG4 LACTIC ACID, ED     Status: Abnormal   Collection Time    07/28/13  9:45 PM      Result Value Ref Range   Lactic Acid, Venous 2.60 (*) 0.5 - 2.2 mmol/L  MRSA PCR  SCREENING     Status: None   Collection Time    07/29/13  2:00 AM      Result Value Ref Range   MRSA by PCR NEGATIVE  NEGATIVE   Comment:            The GeneXpert MRSA Assay (FDA     approved for NASAL specimens     only), is one component of a     comprehensive MRSA colonization     surveillance program. It is not     intended to diagnose MRSA     infection nor to guide or     monitor treatment for     MRSA infections.  HEPARIN LEVEL (UNFRACTIONATED)     Status: Abnormal   Collection Time    07/29/13 11:45 AM      Result Value Ref Range   Heparin Unfractionated 0.10 (*) 0.30 - 0.70 IU/mL   Comment:            IF HEPARIN RESULTS ARE BELOW     EXPECTED VALUES, AND PATIENT     DOSAGE HAS BEEN CONFIRMED,     SUGGEST FOLLOW UP TESTING     OF ANTITHROMBIN III LEVELS.  APTT     Status: Abnormal   Collection Time    07/29/13 11:45 AM      Result Value Ref Range   aPTT 69 (*) 24 - 37 seconds   Comment:            IF BASELINE aPTT IS ELEVATED,     SUGGEST PATIENT RISK ASSESSMENT     BE USED TO DETERMINE APPROPRIATE     ANTICOAGULANT THERAPY.  CBC     Status: Abnormal   Collection Time    07/29/13  9:35 PM      Result Value Ref Range   WBC 4.1  4.0 - 10.5 K/uL   RBC 3.66 (*) 4.22 - 5.81 MIL/uL   Hemoglobin 10.2 (*) 13.0 - 17.0 g/dL   HCT 30.8 (*) 39.0 - 52.0 %   MCV 84.2  78.0 - 100.0 fL   MCH 27.9  26.0 - 34.0 pg   MCHC 33.1  30.0 - 36.0 g/dL   RDW 17.7 (*) 11.5 - 15.5 %   Platelets 140 (*) 150 - 400 K/uL  BASIC METABOLIC PANEL     Status: Abnormal   Collection Time    07/29/13  9:35 PM      Result Value Ref Range   Sodium 139  137 - 147 mEq/L   Potassium 4.2  3.7 - 5.3 mEq/L   Comment: DELTA CHECK NOTED   Chloride 100  96 - 112 mEq/L   CO2 27  19 - 32 mEq/L   Glucose, Bld 101 (*) 70 - 99 mg/dL   BUN 20  6 - 23 mg/dL   Creatinine, Ser 1.13  0.50 - 1.35 mg/dL   Calcium 7.9 (*) 8.4 - 10.5 mg/dL   GFR calc non Af Amer 59 (*) >90 mL/min   GFR calc Af Amer 68 (*) >90  mL/min   Comment: (NOTE)     The eGFR has been calculated using the CKD EPI equation.     This calculation has not been  validated in all clinical situations.     eGFR's persistently <90 mL/min signify possible Chronic Kidney     Disease.  HEPARIN LEVEL (UNFRACTIONATED)     Status: Abnormal   Collection Time    07/29/13  9:35 PM      Result Value Ref Range   Heparin Unfractionated 0.28 (*) 0.30 - 0.70 IU/mL   Comment:            IF HEPARIN RESULTS ARE BELOW     EXPECTED VALUES, AND PATIENT     DOSAGE HAS BEEN CONFIRMED,     SUGGEST FOLLOW UP TESTING     OF ANTITHROMBIN III LEVELS.  HEPARIN LEVEL (UNFRACTIONATED)     Status: Abnormal   Collection Time    07/30/13  3:08 AM      Result Value Ref Range   Heparin Unfractionated 0.25 (*) 0.30 - 0.70 IU/mL   Comment:            IF HEPARIN RESULTS ARE BELOW     EXPECTED VALUES, AND PATIENT     DOSAGE HAS BEEN CONFIRMED,     SUGGEST FOLLOW UP TESTING     OF ANTITHROMBIN III LEVELS.  CBC     Status: Abnormal   Collection Time    07/30/13  3:08 AM      Result Value Ref Range   WBC 4.5  4.0 - 10.5 K/uL   RBC 3.79 (*) 4.22 - 5.81 MIL/uL   Hemoglobin 10.0 (*) 13.0 - 17.0 g/dL   HCT 31.7 (*) 39.0 - 52.0 %   MCV 83.6  78.0 - 100.0 fL   MCH 26.4  26.0 - 34.0 pg   MCHC 31.5  30.0 - 36.0 g/dL   RDW 17.9 (*) 11.5 - 15.5 %   Platelets 152  150 - 400 K/uL    Imaging: No results found.  Assessment:  Principal Problem:   Arterial embolism of right leg Active Problems:   CAD   Atrial fibrillation   HTN (hypertension)   Ischemic cardiomyopathy   Ischemic leg   Plan:  1. Would recommend restarting Eliquis for anticoagulation. He was previously on the 2.5 mg BID dose, however, other than age >78, he does not meet weight <60KG or Cr>1.5 criteria for the reduced dose. Suspect he will tolerate the 5 mg BID dose. Will ask pharmacy for assistance. Congestive heart failure appears stable, although he has a slight wheeze and non-productive  cough.  Time Spent Directly with Patient:  15 minutes  Length of Stay:  LOS: 2 days   Pixie Casino, MD, [facility] Attending Cardiologist [facility]  Rylin Saez C 07/30/2013, 8:28 AM

## 2013-07-30 NOTE — Progress Notes (Signed)
[name] for Heparin>>Apixaban Indication: Afib, RLE thrombus s/p embolectomy  No Known Allergies  Patient Measurements: Height: 5\' 8"  (172.7 cm) Weight: 157 lb 3 oz (71.3 kg) IBW/kg (Calculated) : 68.4  Vital Signs: Temp: 98.6 F (37 C) (03/12 0512) Temp src: Oral (03/12 0512) BP: 104/59 mmHg (03/12 0512) Pulse Rate: 65 (03/12 0512)  Labs:  Recent Labs  07/28/13 2137 07/29/13 1145 07/29/13 2135 07/30/13 0308  HGB 11.9*  --  10.2* 10.0*  HCT 36.5*  --  30.8* 31.7*  PLT 172  --  140* 152  APTT  --  69*  --   --   HEPARINUNFRC  --  0.10* 0.28* 0.25*  CREATININE 1.19  --  1.13  --   CKTOTAL 85  --   --   --     Estimated Creatinine Clearance: 48.8 ml/min (by C-G formula based on Cr of 1.13).   Assessment: 80 yo male with Afib s/p embolectomy for heparin.  He was previously on apixaban 2.5 mg bid but discontinued this medication on his own. He does not qualify for this lower dose.   Goal of Therapy:  Monitor platelets by anticoagulation protocol: Yes   Plan:  Resume apixaban at 5mg  po bid, dc heparin. Monitor for bleeding.   Marcus [name] 07/30/2013,8:35 AM

## 2013-07-30 NOTE — Addendum Note (Signed)
Addendum created 07/30/13 1258 by Neldon Newport, CRNA   Modules edited: Notes Section   Notes Section:  Delete: [id number]; File: [id number]

## 2013-07-31 ENCOUNTER — Telehealth: Payer: Self-pay | Admitting: Vascular Surgery

## 2013-07-31 MED ORDER — OXYCODONE HCL 5 MG PO TABS
5.0000 mg | ORAL_TABLET | Freq: Four times a day (QID) | ORAL | Status: DC | PRN
Start: 2013-07-31 — End: 2013-09-16

## 2013-07-31 MED ORDER — APIXABAN 5 MG PO TABS: 5.0000 mg | ORAL_TABLET | Freq: Two times a day (BID) | ORAL | Status: DC

## 2013-07-31 NOTE — Telephone Encounter (Addendum)
Message copied by Gena Fray on Fri Jul 31, 2013  3:46 PM ------      Message from: Denman George      Created: Fri Jul 31, 2013 10:22 AM      Regarding: Micheline Rough                   ----- Message -----         From: Gabriel Earing, PA-C         Sent: 07/31/2013   7:44 AM           To: [facility] Charge Pool            S/p Right popliteal and tibial embolectomy on 07/28/13.  F/u with Dr. Scot Dock in 2 weeks.            Thanks,      Samantha ------  07/31/13: lm for pt re appt, dpm

## 2013-07-31 NOTE — Progress Notes (Signed)
Occupational Therapy Evaluation Patient Details Name: Marcus [name] MRN: [id number] DOB: [birth year] Today's Date: 07/31/2013 Time: [id number] OT Time Calculation (min): 19 min  OT Assessment / Plan / Recommendation History of present illness Marcus [name] is a 80 y.o. male with a history of atrial fibrillation who developed the sudden onset of pain and paresthesias in the right foot. S/P R popliteal tibial embolectomy    Clinical Impression   Pt admitted with above.  Pt is overall supervision level with ADL tasks and functional mobility.  Initially required min guard upon standing due to slight LOB posteriorly but able to self-correct without therapist assist.  Pt will have 24/7 supervision/assist at home from wife.  No DME needs. Education completed. OT to sign off.    OT Assessment  Patient does not need any further OT services    Follow Up Recommendations  No OT follow up;Supervision/Assistance - 24 hour    Barriers to Discharge      Equipment Recommendations  None recommended by OT    Recommendations for Other Services    Frequency       Precautions / Restrictions Restrictions Weight Bearing Restrictions: No   Pertinent Vitals/Pain See vitals    ADL  Eating/Feeding: Performed;Independent Where Assessed - Eating/Feeding: Edge of bed Grooming: Performed;Wash/dry hands;Supervision/safety Where Assessed - Grooming: Unsupported standing Lower Body Bathing: Simulated;Supervision/safety Where Assessed - Lower Body Bathing: Unsupported sit to stand Lower Body Dressing: Performed;Supervision/safety Where Assessed - Lower Body Dressing: Unsupported sit to stand Toilet Transfer: Performed;Supervision/safety Toilet Transfer Method: Sit to Loss adjuster, chartered: Regular height toilet Toileting - Clothing Manipulation and Hygiene: Performed;Supervision/safety Where Assessed - Toileting Clothing Manipulation and Hygiene: Sit to stand from 3-in-1 or  toilet Transfers/Ambulation Related to ADLs: Initially min guard ambulating from bed to bathroom.  Pt's first time up this morning and he was somewhat stiff and holding onto sink countertop for support.  Pt much more steady ambulating from bathroom to recliner at supervision level. ADL Comments: Pt with no c/o RLE pain but does report being somewhat stiff.  Educated pt on donning pants/undergarments by threading RLE through clothing first. Also recommended sitting on EOB or in chair to perform dressing tasks and using shower seat at home in order to minimize fall risk.    OT Diagnosis:    OT Problem List:   OT Treatment Interventions:     OT Goals(Current goals can be found in the care plan section)    Visit Information  Last OT Received On: 07/31/13 Assistance Needed: +1 History of Present Illness: Marcus [name] is a 80 y.o. male with a history of atrial fibrillation who developed the sudden onset of pain and paresthesias in the right foot. S/P R popliteal tibial embolectomy        Prior Functioning     Home Living Family/patient expects to be discharged to:: Private residence Living Arrangements: Spouse/significant other Available Help at Discharge: Family;Available PRN/intermittently Type of Home: House Home Access: Stairs to enter [employer] of Steps: 1 Entrance Stairs-Rails: None Home Layout: One level Home Equipment: Shower seat - built in Additional Comments: His home has 3 STE but he enters through the garage which only has 1 STE Prior Function Level of Independence: Independent Comments: enjoys gardening Communication Communication: No difficulties         Vision/Perception Vision - History Baseline Vision: Wears glasses all the time   Cognition  Cognition Arousal/Alertness: Awake/alert Behavior During Therapy: WFL for tasks assessed/performed Overall Cognitive Status: Within Functional  Limits for tasks assessed    Extremity/Trunk  Assessment Upper Extremity Assessment Upper Extremity Assessment: Overall WFL for tasks assessed     Mobility Bed Mobility Overal bed mobility: Modified Independent Transfers Overall transfer level: Needs assistance Transfers: Sit to/from Stand Sit to Stand: Min guard;Supervision General transfer comment: Initially min guard due to slight posterior LOB upon standing.  Pt ambulated in room without DME with min guard then supervision.  No physical assist needed for balance.     Exercise     Balance     End of Session OT - End of Session Equipment Utilized During Treatment: Gait belt Activity Tolerance: Patient tolerated treatment well Patient left: in chair;with call bell/phone within reach Nurse Communication: Mobility status  GO   07/31/2013 Darrol Jump OTR/L Pager [phone] Office [phone]   Darrol Jump 07/31/2013, 10:22 AM

## 2013-07-31 NOTE — Discharge Summary (Signed)
[facility] Discharge Summary  Marcus [name] September 10, 1928 80 y.o. male  [id number]  Admission Date: 07/28/2013  Discharge Date: 07/31/13  Physician: No att. providers found  Admission Diagnosis: blood clot in leg Right cold leg   HPI:   This is a 80 y.o. male with a history of atrial fibrillation, who developed the sudden onset of pain and paresthesias in his right foot at approximately 4 PM today. Initially the symptoms began from his knee down and have now settled in the right foot only. He continues to have paresthesias cold less and pain in the right foot. He has a history of atrial flutter ablation and according to his wife, has not been taking anti-coagulation for the last month. Prior to this event, he denies any history of claudication, rest pain, or nonhealing ulcers. I suspect his activity is fairly limited. His cardiologist is Dr. Mertie Moores.  Hospital Course:  The patient was admitted to the hospital and taken to the operating room on 07/28/2013 - 07/29/2013 and underwent: right popliteal and tibial embolectomy    The pt tolerated the procedure well and was transported to the PACU in good condition. By the next morning, the pt's foot was feeling better and right calf is soft.  A cardiology consult was obtained.  He Spent a long time reinforcing the purpose of anticoagulation therapy and the risks of noncompliance; his risk of future embolic events and stroke is extremely high in the setting of depressed left ventricular systolic function, recurrent persistent atrial fibrillation, advanced age and history of embolic events. Anticoagulants should only be stopped for truly life-threatening hemorrhagic events and major surgical procedures.  A 2D echo was obtained with the following results: Study Conclusions  - Left ventricle: The cavity size was mildly dilated. Wall thickness was normal. Systolic function was severely reduced. The estimated ejection  fraction was in the range of 20% to 25%. Akinesis and scarring of the inferolateral, inferior, and inferoseptal myocardium; in the distribution of the right coronary artery. The study is not technically sufficient to allow evaluation of LV diastolic function. No evidence of thrombus. - Aortic valve: Trivial regurgitation. - Left atrium: The atrium was mildly dilated. - Right ventricle: Systolic function was mildly to moderately reduced. - Right atrium: The atrium was moderately dilated.  Pt was continued on heparin and continued anticoagulation was recommended by cardiology and the pt was agreeable.  Cardiology recommends starting Eliquis.  He was previously on 2.5 mg bid, however other than his age > 55, he does not meed weight <60kg or Cr > 1.5 criteria for the reduced dose and it is suspected he will tolerated the 5mg  dose.  CHF appears stable, although he does have a slight wheeze and non-productive cough.  His JP drain was removed on POD 2.  He was discharged home on POD 3 on Eliquis.  The remainder of the hospital course consisted of increasing mobilization and increasing intake of solids without difficulty.  CBC    Component Value Date/Time   WBC 4.5 07/30/2013 0308   RBC 3.79* 07/30/2013 0308   RBC 3.46* 06/29/2012 0752   HGB 10.0* 07/30/2013 0308   HCT 31.7* 07/30/2013 0308   PLT 152 07/30/2013 0308   MCV 83.6 07/30/2013 0308   MCH 26.4 07/30/2013 0308   MCHC 31.5 07/30/2013 0308   RDW 17.9* 07/30/2013 0308   LYMPHSABS 0.8 07/28/2013 2137   MONOABS 0.5 07/28/2013 2137   EOSABS 0.1 07/28/2013 2137   BASOSABS 0.0 07/28/2013 2137  BMET    Component Value Date/Time   NA 139 07/29/2013 2135   K 4.2 07/29/2013 2135   CL 100 07/29/2013 2135   CO2 27 07/29/2013 2135   GLUCOSE 101* 07/29/2013 2135   BUN 20 07/29/2013 2135   CREATININE 1.13 07/29/2013 2135   CALCIUM 7.9* 07/29/2013 2135   GFRNONAA 59* 07/29/2013 2135   GFRAA 68* 07/29/2013 2135     Discharge Instructions:   The  patient is discharged to home with extensive instructions on wound care and progressive ambulation.  They are instructed not to drive or perform any heavy lifting until returning to see the physician in his office.      Discharge Orders   Future Appointments Provider Department Dept Phone   08/17/2013 2:15 PM Jennye Moccasin [facility] [place] Office [phone]   08/19/2013 12:30 PM Angelia Mould, MD [facility] [phone]   09/01/2013 2:30 PM [name], MD [facility] [phone]   Future Orders Complete By Expires   Call MD for:  redness, tenderness, or signs of infection (pain, swelling, bleeding, redness, odor or green/yellow discharge around incision site)  As directed    Call MD for:  severe or increased pain, loss or decreased feeling  in affected limb(s)  As directed    Call MD for:  temperature >100.5  As directed    Discharge wound care:  As directed    Comments:     Shower daily with soap and water starting 08/01/13   Driving Restrictions  As directed    Comments:     No driving for 2 weeks   Lifting restrictions  As directed    Comments:     No lifting for 4 weeks   Resume previous diet  As directed       Discharge Diagnosis:  blood clot in leg Right cold leg  Secondary Diagnosis: Patient Active Problem List   Diagnosis Date Noted  . Arterial embolism of right leg 07/30/2013  . Ischemic leg 07/29/2013  . Chronic systolic heart failure [id number]  . Paroxysmal atrial flutter 06/29/2012  . Acute on chronic systolic CHF (congestive heart failure) 06/28/2012  . Ischemic cardiomyopathy 06/28/2012  . Hyperlipidemia 06/28/2012  . Normocytic anemia 06/28/2012  . Anemia 06/25/2012  . Pacemaker-Medtronic 02/01/2012  . HTN (hypertension) 02/28/2011  . Essential hypertension, benign 06/16/2010  . CAD 06/16/2010  . Atrial fibrillation 06/16/2010  . SICK SINUS SYNDROME 06/16/2010   Past Medical  History  Diagnosis Date  . Coronary artery disease     a. s/p remote CABG x 2(VG->OM, LIMA->LAD;  b. Late 90's s/p PCI of RCA;  c. 12/09 Cath/PCI: LM 57m, 70-80d (3.0x20mm Xience DES), LAD100p, LCX 80-90p (2.25x37mmTaxus Atom DES), RCA 100d (2.5x39mm Xience DES), VG->OM 100, LIMA->LAD nl, EF 30-35%.  . Hyperlipidemia   . Prostate cancer   . PAF (paroxysmal atrial fibrillation)   . Ischemic cardiomyopathy     a. EF 35-40% by echo 05/2012  . Symptomatic bradycardia     a. 08/2003 s/p MDT Enpulse [id number] Dual chamber PPM ser # [id number] H.  . Systolic CHF   . Atrial fibrillation   . CHF (congestive heart failure)   . Peripheral vascular disease        Medication List         apixaban 5 MG Tabs tablet  Commonly known as:  ELIQUIS  Take 1 tablet (5 mg total) by mouth 2 (two) times daily.  CALCIUM PO  Take 1 tablet by mouth daily.     carvedilol 3.125 MG tablet  Commonly known as:  COREG  TAKE ONE TABLET BY MOUTH TWICE DAILY WITH MEALS     COQ10 PO  Take 1 tablet by mouth daily.     fish oil-omega-3 fatty acids 1000 MG capsule  Take 1 g by mouth daily.     LYCOPENE PO  Take 1 tablet by mouth daily.     oxyCODONE 5 MG immediate release tablet  Commonly known as:  ROXICODONE  Take 1 tablet (5 mg total) by mouth every 6 (six) hours as needed for severe pain.     potassium chloride SA 20 MEQ tablet  Commonly known as:  K-DUR,KLOR-CON  Take 1 tablet (20 mEq total) by mouth daily.     torsemide 20 MG tablet  Commonly known as:  DEMADEX  Take 2 tablets (40 mg total) by mouth daily.     VITAMIN C PO  Take 1 tablet by mouth daily.     VITAMIN D PO  Take 1 capsule by mouth daily.     VITAMIN E PO  Take 1 capsule by mouth 2 (two) times daily.        Roxicodone #30 No Refill Apixaban 5 mg #60 with refills per cardiology  Disposition: home  Patient's condition: is Good  Follow up: 1. Dr. Scot Dock in 2 weeks 2. Dr. Acie Fredrickson in 1-2 weeks.   Leontine Locket,  PA-C [facility] [phone] 08/09/2013  11:04 AM

## 2013-07-31 NOTE — Progress Notes (Signed)
   VASCULAR PROGRESS NOTE  SUBJECTIVE: No complaints.   PHYSICAL EXAM: Filed Vitals:   07/30/13 1700 07/30/13 2044 07/31/13 0459 07/31/13 0504  BP: 123/70 126/63 118/65   Pulse: 62 63 60   Temp:  97.2 F (36.2 C) 98.5 F (36.9 C)   TempSrc:  Oral Oral   Resp:  17 18   Height:      Weight:    152 lb 14.4 oz (69.355 kg)  SpO2:  100% 95%    Incision looks fine. Palpable right PT pulse. No significant leg swelling.   LABS: Lab Results  Component Value Date   WBC 4.5 07/30/2013   HGB 10.0* 07/30/2013   HCT 31.7* 07/30/2013   MCV 83.6 07/30/2013   PLT 152 07/30/2013   Lab Results  Component Value Date   CREATININE 1.13 07/29/2013   Lab Results  Component Value Date   INR 1.47 06/28/2012   Principal Problem:   Arterial embolism of right leg Active Problems:   CAD   Atrial fibrillation   HTN (hypertension)   Ischemic cardiomyopathy   Ischemic leg  ASSESSMENT AND PLAN:  * 3 Days Post-Op s/p: Right popliteal and tibial embolectomy   * Appreciate Cardiology's help. Pt is now back on Eliquis  * D/C today as he has done well with PTx.   Gae Gallop  [id number]  07/31/2013

## 2013-07-31 NOTE — Progress Notes (Signed)
Nursing note Patient given Discharge instructions, prescriptions and medication list. All questions were answered will discharge home as ordered. Keaghan Bowens, Bettina Gavia RN

## 2013-08-01 NOTE — ED Provider Notes (Signed)
I saw and evaluated the patient, reviewed the resident's note and I agree with the findings and plan.   EKG Interpretation   Date/Time:  Tuesday July 28 2013 21:53:46 EDT Ventricular Rate:  62 PR Interval:    QRS Duration: 119 QT Interval:  498 QTC Calculation: 506 R Axis:   84 Text Interpretation:  Afib/flut and V-paced complexes No further rhythm  analysis attempted due to paced rhythm Repol abnrm suggests ischemia,  diffuse leads Prolonged QT interval Reconfirmed by Alvino Chapel  MD, Ovid Curd  [phone]) on 08/01/2013 8:28:30 AM     Patient with ischemic foot. Likely due to afib off anticoagulation. Will admit.  Jasper Riling. Alvino Chapel, MD 08/01/13 0830

## 2013-08-05 ENCOUNTER — Other Ambulatory Visit: Payer: Self-pay | Admitting: Cardiovascular Disease

## 2013-08-07 ENCOUNTER — Encounter: Payer: Self-pay | Admitting: Family

## 2013-08-07 ENCOUNTER — Other Ambulatory Visit: Payer: Self-pay | Admitting: *Deleted

## 2013-08-07 ENCOUNTER — Ambulatory Visit (HOSPITAL_COMMUNITY)
Admission: RE | Admit: 2013-08-07 | Discharge: 2013-08-07 | Disposition: A | Discharge status: Home / Self Care | Payer: Medicare Other | Source: Ambulatory Visit | Attending: Family | Admitting: Family

## 2013-08-07 ENCOUNTER — Telehealth: Payer: Self-pay | Admitting: *Deleted

## 2013-08-07 ENCOUNTER — Ambulatory Visit (INDEPENDENT_AMBULATORY_CARE_PROVIDER_SITE_OTHER): Payer: Self-pay | Admitting: Family

## 2013-08-07 VITALS — BP 111/69 | HR 64 | Temp 97.0°F | Resp 14 | Ht 68.0 in | Wt 158.0 lb

## 2013-08-07 DIAGNOSIS — M25579 Pain in unspecified ankle and joints of unspecified foot: Secondary | ICD-10-CM

## 2013-08-07 DIAGNOSIS — M79609 Pain in unspecified limb: Secondary | ICD-10-CM

## 2013-08-07 DIAGNOSIS — M7989 Other specified soft tissue disorders: Secondary | ICD-10-CM

## 2013-08-07 DIAGNOSIS — L539 Erythematous condition, unspecified: Secondary | ICD-10-CM

## 2013-08-07 NOTE — Progress Notes (Signed)
[facility] HISTORY AND PHYSICAL -PAD  History of Present Illness Marcus [name] is a 80 y.o. male patient of Dr. Scot Dock, c/o right foot is more swollen today and red x 7 days, painful,  is  S/P Right Pop-Tibial Embolectomy 07-28-13.  Pain in right foot is 6/10 now. He states that he is elevating his legs when not walking as Dr. Scot Dock instructed him to do, and he is walking regularly. He denies fever or chills.  Other anticoagulants/antiplatelets: Elequis, for atrial fib  Past Medical History  Diagnosis Date  . Coronary artery disease     a. s/p remote CABG x 2(VG->OM, LIMA->LAD;  b. Late 90's s/p PCI of RCA;  c. 12/09 Cath/PCI: LM 44m, 70-80d (3.0x20mm Xience DES), LAD100p, LCX 80-90p (2.25x31mmTaxus Atom DES), RCA 100d (2.5x32mm Xience DES), VG->OM 100, LIMA->LAD nl, EF 30-35%.  . Hyperlipidemia   . Prostate cancer   . PAF (paroxysmal atrial fibrillation)   . Ischemic cardiomyopathy     a. EF 35-40% by echo 05/2012  . Symptomatic bradycardia     a. 08/2003 s/p MDT Enpulse [id number] Dual chamber PPM ser # [id number] H.  . Systolic CHF   . Atrial fibrillation   . CHF (congestive heart failure)   . Peripheral vascular disease     Social History History  Substance Use Topics  . Smoking status: Former Smoker    Quit date: 02/10/1971  . Smokeless tobacco: Never Used  . Alcohol Use: No    Family History Family History  Problem Relation Age of Onset  . Hypertension Mother   . Colon cancer Mother   . Cancer Mother     Colon  . Alcohol abuse Father     Past Surgical History  Procedure Laterality Date  . Insert / replace / remove pacemaker    . Doppler echocardiography  06/24/2002    EF 60-65%  . Tee without cardioversion  06/26/2012    Procedure: TRANSESOPHAGEAL ECHOCARDIOGRAM (TEE);  Surgeon: Lelon Perla, MD;  Location: [facility] END[facility]COPY;  Service: Cardiovascular;  Laterality: [name];  . Cardioversion  06/26/2012    Procedure: CARDIOVERSION;   Surgeon: Lelon Perla, MD;  Location: [place];  Service: Cardiovascular;  Laterality: [name];  . Appendectomy    . Angioplasty      stent placement  . Embolectomy Right 07/28/2013    Procedure: Right Popliteal and Tibial Embolectomy;  Surgeon: Angelia Mould, MD;  Location: [facility] OR;  Service: Vascular;  Laterality: Right;  . Coronary artery bypass graft  1990    No Known Allergies  Current Outpatient Prescriptions  Medication Sig Dispense Refill  . apixaban (ELIQUIS) 5 MG TABS tablet Take 1 tablet (5 mg total) by mouth 2 (two) times daily.  60 tablet  2  . Ascorbic Acid (VITAMIN C PO) Take 1 tablet by mouth daily.       Marland Kitchen CALCIUM PO Take 1 tablet by mouth daily.       . carvedilol (COREG) 3.125 MG tablet TAKE ONE TABLET BY MOUTH TWICE DAILY WITH MEALS  60 tablet  0  . Cholecalciferol (VITAMIN D PO) Take 1 capsule by mouth daily.       . Coenzyme Q10 (COQ10 PO) Take 1 tablet by mouth daily.       . fish oil-omega-3 fatty acids 1000 MG capsule Take 1 g by mouth daily.       Marland Kitchen LYCOPENE PO Take 1 tablet by mouth daily.       Marland Kitchen  oxyCODONE (ROXICODONE) 5 MG immediate release tablet Take 1 tablet (5 mg total) by mouth every 6 (six) hours as needed for severe pain.  30 tablet  0  . potassium chloride SA (K-DUR,KLOR-CON) 20 MEQ tablet Take 1 tablet (20 mEq total) by mouth daily.  90 tablet  2  . torsemide (DEMADEX) 20 MG tablet Take 2 tablets (40 mg total) by mouth daily.  65 tablet  5  . VITAMIN E PO Take 1 capsule by mouth 2 (two) times daily.        No current facility-administered medications for this visit.    ROS: See HPI for pertinent positives and negatives.   Physical Examination  Filed Vitals:   08/07/13 1238  BP: 111/69  Pulse: 64  Temp: 97 F (36.1 C)  Resp: 14   Filed Weights   08/07/13 1238  Weight: 158 lb (71.668 kg)   Body mass index is 24.03 kg/(m^2).   General: A&O x 3, WDWN, . Gait: limp Eyes: PERRLA. Pulmonary: CTAB, without wheezes , rales or  rhonchi. Cardiac: regular Rythm , without detected murmur, pacemaker palpated right side of upper chest.      Carotid Bruits Left Right   Negative Negative  Aorta is not palpable. Radial pulses: 2+ palpable and =                           VASCULAR EXAM: Extremities without ischemic changes  without Gangrene; without open wounds.  Right lower leg incision, medial aspect, has no signs of infection, no drainage, now swelling. Right foot is warmer than left. Right lower leg with 2+ pitting edema.                                                                                                          LE Pulses LEFT RIGHT       FEMORAL   palpable   palpable        POPLITEAL  not palpable   not palpable       POSTERIOR TIBIAL   palpable    palpable        DORSALIS PEDIS      ANTERIOR TIBIAL not palpable  not palpable    Abdomen: soft, NT, no masses. Skin: no rashes, no ulcers noted. Musculoskeletal: no muscle wasting or atrophy.  Neurologic: A&O X 3; Appropriate Affect ; SENSATION: normal; MOTOR FUNCTION:  moving all extremities equally, motor strength 5/5 throughout. Speech is fluent/normal. CN 2-12 intact.    Non-Invasive Vascular Imaging: DATE: 08/07/2013  DUPLEX SCAN OF BYPASS:  LOWER EXTREMITY ARTERIAL DUPLEX EVALUATION    INDICATION: Right foot edema and pain.    PREVIOUS INTERVENTION(S): Right tibial and popliteal artery embolectomy 07/28/2013 by Dr. Scot Dock.    DUPLEX EXAM:     RIGHT  LEFT   Peak Systolic Velocity (cm/s) Ratio (if abnormal) Waveform  Peak Systolic Velocity (cm/s) Ratio (if abnormal) Waveform  104  T  Common Femoral Artery     101  T Deep Femoral Artery  65  T Superficial Femoral Artery Proximal     94:151  T Superficial Femoral Artery Mid     63  T Superficial Femoral Artery Distal     22  T Popliteal Artery     87  T Posterior Tibial Artery Dist     52  T Anterior Tibial Artery Distal     84  T Peroneal Artery Distal      Today's ABI / TBI     Previous ABI / TBI (  )     Waveform:    M - Monophasic       B - Biphasic       T - Triphasic  If Ankle Brachial Index (ABI) or Toe Brachial Index (TBI) performed, please see complete report     ADDITIONAL FINDINGS:     IMPRESSION: Widely patent right lower extremity arterial system with a small amount of heterogeneous plaque noted in the distal superficial femoral artery. Triphasic waveforms noted throughout.    Compared to the previous exam:  No prior exam performed at this facility for comparison.     ASSESSMENT: Marcus [name] is a 80 y.o. male who c/o right foot is more swollen today and red x 7 days, painful,  is  S/P Right Pop-Tibial Embolectomy 07-28-13. Duplex of RLE shows widely patent right lower extremity arterial system with a small amount of heterogeneous plaque noted in the distal superficial femoral artery. Triphasic waveforms noted throughout.  Swelling and pain in right lower leg are as expected in some cases for reperfusion. No signs of compartment syndrome. No signs or symptoms of infection.  PLAN:  Dr. Bridgett Larsson spoke with patient and wife, and examined patient. Follow up next week with Dr. Trula Slade as scheduled.  The patient was given information about PAD including signs, symptoms, treatment, what symptoms should prompt the patient to seek immediate medical care, and risk reduction measures to take.  Clemon Chambers, RN, MSN, FNP-C [facility] Phone: [phone]  Clinic MD: Bridgett Larsson  08/07/2013 1:10 PM

## 2013-08-07 NOTE — Patient Instructions (Signed)
Peripheral Vascular Disease Peripheral Vascular Disease (PVD), also called Peripheral Arterial Disease (PAD), is a circulation problem caused by cholesterol (atherosclerotic plaque) deposits in the arteries. PVD commonly occurs in the lower extremities (legs) but it can occur in other areas of the body, such as your arms. The cholesterol buildup in the arteries reduces blood flow which can cause pain and other serious problems. The presence of PVD can place a person at risk for Coronary Artery Disease (CAD).  CAUSES  Causes of PVD can be many. It is usually associated with more than one risk factor such as:   High Cholesterol.  Smoking.  Diabetes.  Lack of exercise or inactivity.  High blood pressure (hypertension).  Obesity.  Family history. SYMPTOMS   When the lower extremities are affected, patients with PVD may experience:  Leg pain with exertion or physical activity. This is called INTERMITTENT CLAUDICATION. This may present as cramping or numbness with physical activity. The location of the pain is associated with the level of blockage. For example, blockage at the abdominal level (distal abdominal aorta) may result in buttock or hip pain. Lower leg arterial blockage may result in calf pain.  As PVD becomes more severe, pain can develop with less physical activity.  In people with severe PVD, leg pain may occur at rest.  Other PVD signs and symptoms:  Leg numbness or weakness.  Coldness in the affected leg or foot, especially when compared to the other leg.  A change in leg color.  Patients with significant PVD are more prone to ulcers or sores on toes, feet or legs. These may take longer to heal or may reoccur. The ulcers or sores can become infected.  If signs and symptoms of PVD are ignored, gangrene may occur. This can result in the loss of toes or loss of an entire limb.  Not all leg pain is related to PVD. Other medical conditions can cause leg pain such  as:  Blood clots (embolism) or Deep Vein Thrombosis.  Inflammation of the blood vessels (vasculitis).  Spinal stenosis. DIAGNOSIS  Diagnosis of PVD can involve several different types of tests. These can include:  Pulse Volume Recording Method (PVR). This test is simple, painless and does not involve the use of X-rays. PVR involves measuring and comparing the blood pressure in the arms and legs. An ABI (Ankle-Brachial Index) is calculated. The normal ratio of blood pressures is 1. As this number becomes smaller, it indicates more severe disease.  < 0.95  indicates significant narrowing in one or more leg vessels.  <0.8 there will usually be pain in the foot, leg or buttock with exercise.  <0.4 will usually have pain in the legs at rest.  <0.25  usually indicates limb threatening PVD.  Doppler detection of pulses in the legs. This test is painless and checks to see if you have a pulses in your legs/feet.  A dye or contrast material (a substance that highlights the blood vessels so they show up on x-ray) may be given to help your caregiver better see the arteries for the following tests. The dye is eliminated from your body by the kidney's. Your caregiver may order blood work to check your kidney function and other laboratory values before the following tests are performed:  Magnetic Resonance Angiography (MRA). An MRA is a picture study of the blood vessels and arteries. The MRA machine uses a large magnet to produce images of the blood vessels.  Computed Tomography Angiography (CTA). A CTA is a   specialized x-ray that looks at how the blood flows in your blood vessels. An IV may be inserted into your arm so contrast dye can be injected.  Angiogram. Is a procedure that uses x-rays to look at your blood vessels. This procedure is minimally invasive, meaning a small incision (cut) is made in your groin. A small tube (catheter) is then inserted into the artery of your groin. The catheter is  guided to the blood vessel or artery your caregiver wants to examine. Contrast dye is injected into the catheter. X-rays are then taken of the blood vessel or artery. After the images are obtained, the catheter is taken out. TREATMENT  Treatment of PVD involves many interventions which may include:  Lifestyle changes:  Quitting smoking.  Exercise.  Following a low fat, low cholesterol diet.  Control of diabetes.  Foot care is very important to the PVD patient. Good foot care can help prevent infection.  Medication:  Cholesterol-lowering medicine.  Blood pressure medicine.  Anti-platelet drugs.  Certain medicines may reduce symptoms of Intermittent Claudication.  Interventional/Surgical options:  Angioplasty. An Angioplasty is a procedure that inflates a balloon in the blocked artery. This opens the blocked artery to improve blood flow.  Stent Implant. A wire mesh tube (stent) is placed in the artery. The stent expands and stays in place, allowing the artery to remain open.  Peripheral Bypass Surgery. This is a surgical procedure that reroutes the blood around a blocked artery to help improve blood flow. This type of procedure may be performed if Angioplasty or stent implants are not an option. SEEK IMMEDIATE MEDICAL CARE IF:   You develop pain or numbness in your arms or legs.  Your arm or leg turns cold, becomes blue in color.  You develop redness, warmth, swelling and pain in your arms or legs. MAKE SURE YOU:   Understand these instructions.  Will watch your condition.  Will get help right away if you are not doing well or get worse. Document Released: 06/14/2004 Document Revised: 07/30/2011 Document Reviewed: 05/11/2008 Exit[employer] Patient Information 2014 [employer].  

## 2013-08-07 NOTE — Telephone Encounter (Signed)
Mrs. Davontay Sokalski (Pt.'s wife) is calling on his behalf to report that he was unable to sleep last night due to severe right foot pain ("from base of his toes toward his right heel") and that pain is continuing this morning.  She states that he does not have fever or chills, that his right foot is not hot or cold to touch, denies numbness.  She states that his right foot is more swollen today and that the base of his foot is red.  Reviewed Mr. Gennusa history and symptoms with Dr. Adele Barthel who ordered LE arterial duplex today and to see Cresenciano Lick, NP today.  Mrs. Nazzal aware and will make transportation arrangements to get Mr. Poppen into the office today for testing and office visit.  LE arterial duplex scheduled for 11:30AM and to see Cresenciano Lick NP at 12:20PM today.

## 2013-08-09 NOTE — Discharge Summary (Signed)
Agree with D/C plans.  Deitra Mayo, MD, FACS Beeper [phone] 08/09/2013

## 2013-08-17 ENCOUNTER — Ambulatory Visit (INDEPENDENT_AMBULATORY_CARE_PROVIDER_SITE_OTHER): Payer: Medicare Other | Admitting: Physician Assistant

## 2013-08-17 ENCOUNTER — Encounter: Payer: Self-pay | Admitting: Physician Assistant

## 2013-08-17 VITALS — BP 118/68 | HR 70 | Ht 68.0 in | Wt 162.8 lb

## 2013-08-17 DIAGNOSIS — E785 Hyperlipidemia, unspecified: Secondary | ICD-10-CM | POA: Diagnosis not present

## 2013-08-17 DIAGNOSIS — I2589 Other forms of chronic ischemic heart disease: Secondary | ICD-10-CM | POA: Diagnosis not present

## 2013-08-17 DIAGNOSIS — I5020 Unspecified systolic (congestive) heart failure: Secondary | ICD-10-CM | POA: Diagnosis not present

## 2013-08-17 DIAGNOSIS — R0602 Shortness of breath: Secondary | ICD-10-CM | POA: Diagnosis not present

## 2013-08-17 LAB — BASIC METABOLIC PANEL
BUN: 33 mg/dL — ABNORMAL HIGH (ref 6–23)
CALCIUM: 9.4 mg/dL (ref 8.4–10.5)
CO2: 29 mEq/L (ref 19–32)
Chloride: 98 mEq/L (ref 96–112)
Creatinine, Ser: 1.7 mg/dL — ABNORMAL HIGH (ref 0.4–1.5)
GFR: 41.17 mL/min — AB (ref >60.00)
Glucose, Bld: 92 mg/dL (ref 70–99)
Potassium: 4 mEq/L (ref 3.5–5.1)
Sodium: 137 mEq/L (ref 135–145)

## 2013-08-17 LAB — BRAIN NATRIURETIC PEPTIDE: PRO B NATRI PEPTIDE: 1501 pg/mL — AB (ref 0.0–100.0)

## 2013-08-17 MED ORDER — TORSEMIDE 20 MG PO TABS: ORAL_TABLET | ORAL | Status: DC

## 2013-08-17 MED ORDER — CARVEDILOL 6.25 MG PO TABS: 6.2500 mg | ORAL_TABLET | Freq: Two times a day (BID) | ORAL | Status: DC

## 2013-08-17 MED ORDER — POTASSIUM CHLORIDE CRYS ER 20 MEQ PO TBCR: EXTENDED_RELEASE_TABLET | ORAL | Status: DC

## 2013-08-17 NOTE — Assessment & Plan Note (Signed)
Increase Coreg to 6.25 mg twice a day.

## 2013-08-17 NOTE — Patient Instructions (Signed)
Your physician recommends that you schedule a follow-up appointment in: 2 -[place]  Your physician recommends that you return for lab work in: TODAY BNP, BMET  Your physician has recommended you make the following change in your medication:   TORSEMIDE 40 MG IN AM AND 20 MG IN EVEN ING FOR 3 DAYS AND BACK TO REGULAR DOSE  K-DUR 20 MEQ TWICE A DAY FOR 3 DAYS AND BACK TO REGULAR DOSE

## 2013-08-17 NOTE — Assessment & Plan Note (Signed)
Status post emergency embolectomy after stopping his anticoagulation for a month.

## 2013-08-17 NOTE — Progress Notes (Signed)
HPI:  This is an 80 year old male patient Dr. Acie Fredrickson who is had emergency right popliteal and tibial embolectomy on 07/28/13 for an ischemic right leg after stopping his Eliquis for about a month. The patient had no reason for stopping this. While in the hospital a 2-D echo was performed ejection fraction 20-25% which was much worse than prior echoes. Head akinesis and scarring of the inferolateral, inferior, and inferoseptal myocardium. No evidence of thrombus.  The patient also has coronary artery disease status post CABG and PCI, dual-chamber pacemaker placement with generator change in April 2014, ischemic cardiomyopathy prior ejection fraction 35-40%, remote history of atrial flutter ablation but now with frequent atrial fibrillation 84 percent burden by January device check.  The patient comes in today complaining of right leg swelling and pain. He's tried to keep a bit elevated but the swelling has not gone down. The pain has gotten better since he filled his pain medicine. He also feels like she's got a little fluid buildup more than usual. They've all the low salt diet for years. He is anxious to get back out in the yard working and doing his normal activities. He sees the surgeons back in 2 days.  No Known Allergies  Current Outpatient Prescriptions on File Prior to Visit: apixaban (ELIQUIS) 5 MG TABS tablet, Take 1 tablet (5 mg total) by mouth 2 (two) times daily., Disp: 60 tablet, Rfl: 2 Ascorbic Acid (VITAMIN C PO), Take 1 tablet by mouth daily. , Disp: , Rfl:  CALCIUM PO, Take 1 tablet by mouth daily. , Disp: , Rfl:  carvedilol (COREG) 3.125 MG tablet, TAKE ONE TABLET BY MOUTH TWICE DAILY WITH MEALS, Disp: 60 tablet, Rfl: 0 Cholecalciferol (VITAMIN D PO), Take 1 capsule by mouth daily. , Disp: , Rfl:  Coenzyme Q10 (COQ10 PO), Take 1 tablet by mouth daily. , Disp: , Rfl:  fish oil-omega-3 fatty acids 1000 MG capsule, Take 1 g by mouth daily. , Disp: , Rfl:  potassium chloride SA  (K-DUR,KLOR-CON) 20 MEQ tablet, Take 1 tablet (20 mEq total) by mouth daily., Disp: 90 tablet, Rfl: 2 torsemide (DEMADEX) 20 MG tablet, Take 2 tablets (40 mg total) by mouth daily., Disp: 65 tablet, Rfl: 5 VITAMIN E PO, Take 1 capsule by mouth 2 (two) times daily. , Disp: , Rfl:  oxyCODONE (ROXICODONE) 5 MG immediate release tablet, Take 1 tablet (5 mg total) by mouth every 6 (six) hours as needed for severe pain., Disp: 30 tablet, Rfl: 0  No current facility-administered medications on file prior to visit.   Past Medical History:   Coronary artery disease                                        Comment:a. s/p remote CABG x 2(VG->OM, LIMA->LAD;  b.               Late 90's s/p PCI of RCA;  c. 12/09 Cath/PCI:               LM 69m, 70-80d (3.0x70mm Xience DES), LAD100p,               LCX 80-90p (2.25x22mmTaxus Atom DES), RCA 100d               (2.5x76mm Xience DES), VG->OM 100, LIMA->LAD               nl, EF 30-35%.   Hyperlipidemia  Prostate cancer                                              PAF (paroxysmal atrial fibrillation)                         Ischemic cardiomyopathy                                        Comment:a. EF 35-40% by echo 05/2012   Symptomatic bradycardia                                        Comment:a. 08/2003 s/p MDT Enpulse E2DR01 Dual chamber               PPM ser # [id number] H.   Systolic CHF                                                 Atrial fibrillation                                          CHF (congestive heart failure)                               Peripheral vascular disease                                 Past Surgical History:   INSERT / REPLACE / REMOVE PACEMAKER                           DOPPLER ECHOCARDIOGRAPHY                         06/24/2002       Comment:EF 60-65%   TEE WITHOUT CARDIOVERSION                        06/26/2012       Comment:Procedure: TRANSESOPHAGEAL ECHOCARDIOGRAM                (TEE);  Surgeon: Lelon Perla, MD;                Location: [facility] ENDOSCOPY;  Service:               Cardiovascular;  Laterality: [name];   CARDIOVERSION                                    06/26/2012       Comment:Procedure: CARDIOVERSION;  Surgeon: Lelon Perla, MD;  Location: [place] ENDOSCOPY;  Service: Cardiovascular;  Laterality: [name];   APPENDECTOMY                                                  ANGIOPLASTY                                                     Comment:stent placement   EMBOLECTOMY                                     Right 07/28/2013      Comment:Procedure: Right Popliteal and Tibial               Embolectomy;  Surgeon: Angelia Mould,               MD;  Location: [facility];  Service: Vascular;                Laterality: Right;   CORONARY ARTERY BYPASS GRAFT                     1990        Review of patient's family history indicates:   Hypertension                   Mother                   Colon cancer                   Mother                   Cancer                         Mother                     Comment: Colon   Alcohol abuse                  Father                   Social History   Marital Status: Married             Spouse Name:                      Years of Education:                 Number of children: 0           Occupational History Occupation          Fish farm manager            Comment              retired                                   Social History Main Topics   Smoking Status: Former Smoker  Packs/Day: 0.00  Years:           Quit date: 02/10/1971   Smokeless Status: Never Used                       Alcohol Use: No             Drug Use: No             Sexual Activity: No                 Other Topics            Concern   None on file  Social History Narrative   Lives in [place] with wife.  Retired.    ROS: He always feels thirsty on the Lasix .See history of present illness otherwise  negative   PHYSICAL EXAM: Well-nournished, in no acute distress. Neck: Slight increase JVD, HJR, no Bruit, or thyroid enlargement  Lungs:  Decreased breath sounds but clear without  tachypnea, clear without wheezing, rales, or rhonchi  Cardiovascular: RRR, PMI not displaced, positive S4 and 2/6 systolic murmur at the left sternal border, no bruit, thrill, or heave.  Abdomen: BS normal. Soft without organomegaly, masses, lesions or tenderness.  Extremities: Right lower extremity incisions healing well without cyanosis, clubbing. He has 2-3+ edema on the right up to his knee no edema on the left. Good distal pulses bilateral  SKin: Warm, no lesions or rashes   Musculoskeletal: No deformities  Neuro: no focal signs  BP 118/68  Pulse 70  Ht 5\' 8"  (1.727 m)  Wt 162 lb 12.8 oz (73.846 kg)  BMI 24.76 kg/m2   EKG: Paced rhythm at 70 beats per minute

## 2013-08-17 NOTE — Assessment & Plan Note (Signed)
Patient seems to have a little bit of fluid overload today. I will increase his Lasix to 40 mg in the morning 20 in the evening for 3 days increase his potassium to 20 mEq twice a day for 3 days then back to his usual dose. He is to call sex week if his edema does not resolve. Continue 2 g sodium diet. He will see the surgeons back in 2 days.

## 2013-08-18 ENCOUNTER — Encounter: Payer: Self-pay | Admitting: Vascular Surgery

## 2013-08-19 ENCOUNTER — Telehealth: Payer: Self-pay | Admitting: *Deleted

## 2013-08-19 ENCOUNTER — Encounter: Payer: Self-pay | Admitting: Vascular Surgery

## 2013-08-19 ENCOUNTER — Ambulatory Visit (INDEPENDENT_AMBULATORY_CARE_PROVIDER_SITE_OTHER): Payer: Self-pay | Admitting: Vascular Surgery

## 2013-08-19 VITALS — BP 127/74 | HR 69 | Ht 68.0 in | Wt 159.0 lb

## 2013-08-19 DIAGNOSIS — I739 Peripheral vascular disease, unspecified: Secondary | ICD-10-CM

## 2013-08-19 DIAGNOSIS — R0602 Shortness of breath: Secondary | ICD-10-CM

## 2013-08-19 DIAGNOSIS — E785 Hyperlipidemia, unspecified: Secondary | ICD-10-CM

## 2013-08-19 DIAGNOSIS — Z48812 Encounter for surgical aftercare following surgery on the circulatory system: Secondary | ICD-10-CM

## 2013-08-19 DIAGNOSIS — I5020 Unspecified systolic (congestive) heart failure: Secondary | ICD-10-CM

## 2013-08-19 NOTE — Addendum Note (Signed)
Addended by: Dorthula Rue L on: 08/19/2013 05:46 PM   Modules accepted: Orders

## 2013-08-19 NOTE — Progress Notes (Signed)
   Patient name: Marcus [name] MRN: [id number] DOB: 04-05-1929 Sex: male  REASON FOR VISIT: Follow up after right popliteal and tibial embolectomy  HPI: Marcus [name] is a 80 y.o. male who presented with an ischemic right lower extremity. He has a history of atrial fibrillation. He had organized clot retrieved from his popliteal artery and proximal tibial vessels. He was discharged on Eliquis.  REVIEW OF SYSTEMS: Valu.Nieves ] denotes positive finding; [  ] denotes negative finding  CARDIOVASCULAR:  [ ]  chest pain   [ ]  dyspnea on exertion    CONSTITUTIONAL:  [ ]  fever   [ ]  chills  PHYSICAL EXAM: Filed Vitals:   08/19/13 1321  BP: 127/74  Pulse: 69  Height: 5\' 8"  (1.727 m)  Weight: 159 lb (72.122 kg)  SpO2: 100%   Body mass index is 24.18 kg/(m^2). GENERAL: The patient is a well-nourished male, in no acute distress. The vital signs are documented above. CARDIOVASCULAR: There is a regular rate and rhythm. PULMONARY: There is good air exchange bilaterally without wheezing or rales. He has a palpable right femoral, and right popliteal pulse. He has a biphasic posterior tibial signal and anterior tibial signal on the right. He has moderate swelling of the right leg. His infrapopliteal incision is healing nicely.  MEDICAL ISSUES: The patient is doing well status post right popliteal and tibial embolectomy. I've encouraged him to elevate his leg and we have discussed the proper positioning for this. I will see him back in one year with follow up ABIs. He knows to call sooner if he has problems.  [facility] Beeper: [phone]

## 2013-08-19 NOTE — Telephone Encounter (Signed)
Spoke to pt and his wife about his lab results. Both Pt and his wife showed understanding of results.

## 2013-08-26 ENCOUNTER — Telehealth: Payer: Self-pay | Admitting: *Deleted

## 2013-08-26 ENCOUNTER — Other Ambulatory Visit (INDEPENDENT_AMBULATORY_CARE_PROVIDER_SITE_OTHER): Payer: Medicare Other

## 2013-08-26 DIAGNOSIS — I5020 Unspecified systolic (congestive) heart failure: Secondary | ICD-10-CM

## 2013-08-26 DIAGNOSIS — R0602 Shortness of breath: Secondary | ICD-10-CM

## 2013-08-26 DIAGNOSIS — E785 Hyperlipidemia, unspecified: Secondary | ICD-10-CM

## 2013-08-26 LAB — BASIC METABOLIC PANEL
BUN: 34 mg/dL — ABNORMAL HIGH (ref 6–23)
CALCIUM: 9.2 mg/dL (ref 8.4–10.5)
CO2: 26 meq/L (ref 19–32)
Chloride: 102 mEq/L (ref 96–112)
Creatinine, Ser: 1.7 mg/dL — ABNORMAL HIGH (ref 0.4–1.5)
GFR: 42.61 mL/min — AB (ref >60.00)
Glucose, Bld: 117 mg/dL — ABNORMAL HIGH (ref 70–99)
Potassium: 3.5 mEq/L (ref 3.5–5.1)
SODIUM: 139 meq/L (ref 135–145)

## 2013-08-26 LAB — BRAIN NATRIURETIC PEPTIDE: PRO B NATRI PEPTIDE: 1649 pg/mL — AB (ref 0.0–100.0)

## 2013-08-26 MED ORDER — TORSEMIDE 20 MG PO TABS: ORAL_TABLET | ORAL | Status: DC

## 2013-08-26 NOTE — Telephone Encounter (Signed)
Called pt with lab results. Per Ermalinda Barrios PA-C, to ask pt if he still had edema in his legs. Pt and his wife stated that he's still experiencing edema in the leg he had the clot. Pt also stated that his dieretic that was changed when he had OV to see Ermalinda Barrios PA did not help. Called Ermalinda Barrios PA to inform her and she states pt needs to increase his Torsemide to 40 mg twice a day (2 tablets of 20 mg in A.M and 2 tablets of 20 mg in P.M). She also stated pt needed to come in for an OV to see her 08-31-2013 and have BMET and BNP the same day. Informed pt and he agreed to instructions.

## 2013-08-26 NOTE — Telephone Encounter (Signed)
lmtcb for lab results, number and office hours provided

## 2013-08-31 ENCOUNTER — Encounter: Payer: Self-pay | Admitting: Physician Assistant

## 2013-08-31 ENCOUNTER — Ambulatory Visit (INDEPENDENT_AMBULATORY_CARE_PROVIDER_SITE_OTHER): Payer: Medicare Other | Admitting: Physician Assistant

## 2013-08-31 ENCOUNTER — Telehealth: Payer: Self-pay | Admitting: *Deleted

## 2013-08-31 VITALS — BP 107/56 | HR 58 | Ht 68.0 in | Wt 161.4 lb

## 2013-08-31 DIAGNOSIS — E785 Hyperlipidemia, unspecified: Secondary | ICD-10-CM

## 2013-08-31 DIAGNOSIS — I5022 Chronic systolic (congestive) heart failure: Secondary | ICD-10-CM

## 2013-08-31 DIAGNOSIS — I495 Sick sinus syndrome: Secondary | ICD-10-CM | POA: Diagnosis not present

## 2013-08-31 DIAGNOSIS — I2510 Atherosclerotic heart disease of native coronary artery without angina pectoris: Secondary | ICD-10-CM | POA: Diagnosis not present

## 2013-08-31 DIAGNOSIS — R0602 Shortness of breath: Secondary | ICD-10-CM

## 2013-08-31 DIAGNOSIS — I4891 Unspecified atrial fibrillation: Secondary | ICD-10-CM | POA: Diagnosis not present

## 2013-08-31 DIAGNOSIS — I10 Essential (primary) hypertension: Secondary | ICD-10-CM

## 2013-08-31 DIAGNOSIS — I743 Embolism and thrombosis of arteries of the lower extremities: Secondary | ICD-10-CM

## 2013-08-31 DIAGNOSIS — Z9119 Patient's noncompliance with other medical treatment and regimen: Secondary | ICD-10-CM

## 2013-08-31 DIAGNOSIS — Z91199 Patient's noncompliance with other medical treatment and regimen due to unspecified reason: Secondary | ICD-10-CM

## 2013-08-31 DIAGNOSIS — I5020 Unspecified systolic (congestive) heart failure: Secondary | ICD-10-CM

## 2013-08-31 LAB — BASIC METABOLIC PANEL
BUN: 35 mg/dL — ABNORMAL HIGH (ref 6–23)
CO2: 32 meq/L (ref 19–32)
CREATININE: 1.5 mg/dL (ref 0.4–1.5)
Calcium: 9.2 mg/dL (ref 8.4–10.5)
Chloride: 102 mEq/L (ref 96–112)
GFR: 46.49 mL/min — ABNORMAL LOW (ref >60.00)
Glucose, Bld: 103 mg/dL — ABNORMAL HIGH (ref 70–99)
Potassium: 3.1 mEq/L — ABNORMAL LOW (ref 3.5–5.1)
Sodium: 142 mEq/L (ref 135–145)

## 2013-08-31 LAB — BRAIN NATRIURETIC PEPTIDE: Pro B Natriuretic peptide (BNP): 1214 pg/mL — ABNORMAL HIGH (ref 0.0–100.0)

## 2013-08-31 MED ORDER — POTASSIUM CHLORIDE CRYS ER 20 MEQ PO TBCR: 20.0000 meq | EXTENDED_RELEASE_TABLET | Freq: Every day | ORAL | Status: DC

## 2013-08-31 NOTE — Telephone Encounter (Signed)
Spoke with pt regarding increase in potassium 20 meq bid  Pt is aware to come in for lab

## 2013-08-31 NOTE — Telephone Encounter (Signed)
Called pt regarding lab results that was sent STAT by Estella Husk PA... Was advised my Estella Husk PA , to call pt and give him his lab results, also to continue the same meds and to have a BMET in 1 week

## 2013-08-31 NOTE — Assessment & Plan Note (Signed)
Patient has actually been taking the wrong Eliquis and carvedilol dose. I will check blood work today. As stated above I have discussed this in detail with he and his wife. Hopefully they understand the importance of medical dose compliance. I've ask his wife to keep a closer eye on the medication he's taking.

## 2013-08-31 NOTE — Patient Instructions (Addendum)
Your physician recommends that you have lab work today: BMET, BNP  done as  STAT   Your Physician recommends you keep your upcoming appt with: 1. Dr. Lovena Le for a pacer check 09/01/13 at 2:30 pm. 2. Dr. Acie Fredrickson 09/16/13 at 2:15 pm

## 2013-08-31 NOTE — Assessment & Plan Note (Signed)
Patient still has a low bit of fluid on board. He does have some dyspnea on exertion. He is on Demadex 40 mg twice a day. I will check his blood work today and adjust his diuretics accordingly.

## 2013-08-31 NOTE — Assessment & Plan Note (Signed)
Stable. Have asked him to increase Coreg to 6.25 mg twice a day

## 2013-08-31 NOTE — Assessment & Plan Note (Signed)
Patient was placed on Eliquis 5 mg twice a day in the hospital because his creatinine was normal. Last time we checked it it was 1.7. I will check it today and may need to decrease his Eliquis to 2.5 mg twice a day. He was actually taking the lower dose when he went home because he had the old prescription he wanted to use up. I had a long discussion concerning medical compliance in taking the right dosages with he and his wife.

## 2013-08-31 NOTE — Addendum Note (Signed)
Addended by: Burnett Kanaris on: 08/31/2013 04:57 PM   Modules accepted: Orders

## 2013-08-31 NOTE — Progress Notes (Signed)
HPI:  This is an 80 year old male patient Dr. Acie Fredrickson who is had emergency right popliteal and tibial embolectomy on 07/28/13 for an ischemic right leg after stopping his Eliquis for about a month. The patient had no reason for stopping this. While in the hospital a 2-D echo was performed ejection fraction 20-25% which was much worse than prior echoes. He had akinesis and scarring of the inferolateral, inferior, and inferoseptal myocardium. No evidence of thrombus.  The patient also has coronary artery disease status post CABG and PCI, dual-chamber pacemaker placement with generator change in April 2014, ischemic cardiomyopathy prior ejection fraction 35-40%, remote history of atrial flutter ablation but now with frequent atrial fibrillation 84 percent burden by January device check.  I saw the patient in 08/17/13 at which time he had significant leg edema and pain. He has also complaining of dyspnea on exertion. I did blood work BUN was 34 creatinine up to 1.7 and BNP was 1649. I increased his torsemide to 40 mg in the morning 20 in the afternoon for 3 days. He didn't know if any difference so I increased it to 40 mg twice a day. He thinks his swelling is a little better but he still gets out of breath when he is working in his yard.  Once again the patient is confused about his medication. When he was discharged from the hospital they put him on Eliquis 5 mg twice a day because he had a normal creatinine. When he went home he started taking his old prescription of 2.5 mg twice a day. Once those ran out he just filled the new prescription and start taking 5mg  twice a day. We also increased his Coreg to 6.25 mg twice a day. He has not filled this  because he wanted to finish his old medications. He did not increase the 3.125 mg tablets. I had a long discussion with he and his wife concerning the importance of taking the proper medicine dosages. We will check his creatinine today and may need to decrease he  Eliquis and torsemide.  No Known Allergies  Current Outpatient Prescriptions on File Prior to Visit: apixaban (ELIQUIS) 5 MG TABS tablet, Take 1 tablet (5 mg total) by mouth 2 (two) times daily., Disp: 60 tablet, Rfl: 2 Ascorbic Acid (VITAMIN C PO), Take 1 tablet by mouth daily. , Disp: , Rfl:  CALCIUM PO, Take 1 tablet by mouth daily. , Disp: , Rfl:  carvedilol (COREG) 6.25 MG tablet, Take 1 tablet (6.25 mg total) by mouth 2 (two) times daily with a meal., Disp: 60 tablet, Rfl: 0 Cholecalciferol (VITAMIN D PO), Take 1 capsule by mouth daily. , Disp: , Rfl:  Coenzyme Q10 (COQ10 PO), Take 1 tablet by mouth daily. , Disp: , Rfl:  fish oil-omega-3 fatty acids 1000 MG capsule, Take 1 g by mouth daily. , Disp: , Rfl:  oxyCODONE (ROXICODONE) 5 MG immediate release tablet, Take 1 tablet (5 mg total) by mouth every 6 (six) hours as needed for severe pain., Disp: 30 tablet, Rfl: 0 potassium chloride SA (K-DUR,KLOR-CON) 20 MEQ tablet, TAKING 20 TWICE A DAY FOR 3 DAYS, Disp: 12 tablet, Rfl: 0 torsemide (DEMADEX) 20 MG tablet, TAKE 2 TABLETS IN A.M AND 2 TABLETS IN P.M, Disp: 12 tablet, Rfl: 0 VITAMIN E PO, Take 1 capsule by mouth 2 (two) times daily. , Disp: , Rfl:   No current facility-administered medications on file prior to visit.   Past Medical History:   Coronary artery disease  Comment:a. s/p remote CABG x 2(VG->OM, LIMA->LAD;  b.               Late 90's s/p PCI of RCA;  c. 12/09 Cath/PCI:               LM 29m, 70-80d (3.0x4mm Xience DES), LAD100p,               LCX 80-90p (2.25x53mmTaxus Atom DES), RCA 100d               (2.5x40mm Xience DES), VG->OM 100, LIMA->LAD               nl, EF 30-35%.   Hyperlipidemia                                               Prostate cancer                                              PAF (paroxysmal atrial fibrillation)                         Ischemic cardiomyopathy                                        Comment:a.  EF 35-40% by echo 05/2012   Symptomatic bradycardia                                        Comment:a. 08/2003 s/p MDT Enpulse E2DR01 Dual chamber               PPM ser # [id number] H.   Systolic CHF                                                 Atrial fibrillation                                          CHF (congestive heart failure)                               Peripheral vascular disease                                 Past Surgical History:   INSERT / REPLACE / REMOVE PACEMAKER                           DOPPLER ECHOCARDIOGRAPHY                         06/24/2002       Comment:EF 60-65%   TEE WITHOUT CARDIOVERSION  06/26/2012       Comment:Procedure: TRANSESOPHAGEAL ECHOCARDIOGRAM               (TEE);  Surgeon: Lelon Perla, MD;                Location: [facility] ENDOSCOPY;  Service:               Cardiovascular;  Laterality: [name];   CARDIOVERSION                                    06/26/2012       Comment:Procedure: CARDIOVERSION;  Surgeon: Lelon Perla, MD;  Location: [facility] ENDOSCOPY;                Service: Cardiovascular;  Laterality: [name];   APPENDECTOMY                                                  ANGIOPLASTY                                                     Comment:stent placement   EMBOLECTOMY                                     Right 07/28/2013      Comment:Procedure: Right Popliteal and Tibial               Embolectomy;  Surgeon: Angelia Mould,               MD;  Location: [place];  Service: Vascular;                Laterality: Right;   CORONARY ARTERY BYPASS GRAFT                     1990        Review of patient's family history indicates:   Hypertension                   Mother                   Colon cancer                   Mother                   Cancer                         Mother                     Comment: Colon   Alcohol abuse                  Father                   Social History   Marital Status: Married  Spouse Name:                      Years of Education:                 Number of children: 0           Occupational History Occupation          Fish farm manager            Comment              retired                                   Social History Main Topics   Smoking Status: Former Smoker                   Packs/Day: 0.00  Years:           Quit date: 02/10/1971   Smokeless Status: Never Used                       Alcohol Use: No             Drug Use: No             Sexual Activity: No                 Other Topics            Concern   None on file  Social History Narrative   Lives in [place] with wife.  Retired.    ROS: See history of present illness otherwise negative   PHYSICAL EXAM: Well-nournished, in no acute distress. Neck: Slight increase JVD and HJR, No Bruit, or thyroid enlargement  Lungs: No tachypnea, clear without wheezing, rales, or rhonchi  Cardiovascular: Irreg irreg, PMI not displaced, heart sounds normal, no murmurs, gallops, bruit, thrill, or heave.  Abdomen: BS normal. Soft without organomegaly, masses, lesions or tenderness.  Extremities: +1 edema on the right no edema on the left, otherwise lower extremities without cyanosis, clubbing or edema. Good distal pulses bilateral  SKin: Warm, no lesions or rashes   Musculoskeletal: No deformities  Neuro: no focal signs  BP 107/56  Pulse 58  Ht 5\' 8"  (1.727 m)  Wt 161 lb 6.4 oz (73.211 kg)  BMI 24.55 kg/m2   A 2D echo was obtained with the following results: Study Conclusions  - Left ventricle: The cavity size was mildly dilated. Wall thickness was normal. Systolic function was severely reduced. The estimated ejection fraction was in the range of 20% to 25%. Akinesis and scarring of the inferolateral, inferior, and inferoseptal myocardium; in the distribution of the right coronary artery. The study is not technically sufficient to allow evaluation of LV diastolic function. No evidence of  thrombus. - Aortic valve: Trivial regurgitation. - Left atrium: The atrium was mildly dilated. - Right ventricle: Systolic function was mildly to moderately reduced. - Right atrium: The atrium was moderately dilated.

## 2013-09-01 ENCOUNTER — Ambulatory Visit (INDEPENDENT_AMBULATORY_CARE_PROVIDER_SITE_OTHER): Payer: Medicare Other | Admitting: Internal Medicine

## 2013-09-01 ENCOUNTER — Encounter: Payer: Self-pay | Admitting: Internal Medicine

## 2013-09-01 VITALS — BP 114/72 | HR 70 | Ht 68.0 in | Wt 160.0 lb

## 2013-09-01 DIAGNOSIS — Z950 Presence of cardiac pacemaker: Secondary | ICD-10-CM | POA: Diagnosis not present

## 2013-09-01 DIAGNOSIS — I2510 Atherosclerotic heart disease of native coronary artery without angina pectoris: Secondary | ICD-10-CM

## 2013-09-01 DIAGNOSIS — I255 Ischemic cardiomyopathy: Secondary | ICD-10-CM

## 2013-09-01 DIAGNOSIS — I509 Heart failure, unspecified: Secondary | ICD-10-CM

## 2013-09-01 DIAGNOSIS — R0602 Shortness of breath: Secondary | ICD-10-CM | POA: Diagnosis not present

## 2013-09-01 DIAGNOSIS — I2589 Other forms of chronic ischemic heart disease: Secondary | ICD-10-CM

## 2013-09-01 DIAGNOSIS — I4891 Unspecified atrial fibrillation: Secondary | ICD-10-CM

## 2013-09-01 DIAGNOSIS — I5023 Acute on chronic systolic (congestive) heart failure: Secondary | ICD-10-CM

## 2013-09-01 DIAGNOSIS — I495 Sick sinus syndrome: Secondary | ICD-10-CM

## 2013-09-01 LAB — MDC_IDC_ENUM_SESS_TYPE_INCLINIC
Battery Impedance: 125 Ohm
Battery Remaining Longevity: 110 mo
Battery Voltage: 2.78 V
Brady Statistic AP VS Percent: 2 %
Brady Statistic AS VP Percent: 18 %
Brady Statistic AS VS Percent: 12 %
Date Time Interrogation Session: 20150414155758
Lead Channel Impedance Value: 388 Ohm
Lead Channel Impedance Value: 429 Ohm
Lead Channel Pacing Threshold Amplitude: 0.75 V
Lead Channel Pacing Threshold Pulse Width: 0.4 ms
Lead Channel Sensing Intrinsic Amplitude: 1.4 mV
Lead Channel Sensing Intrinsic Amplitude: 11.2 mV
Lead Channel Setting Sensing Sensitivity: 2.8 mV
MDC IDC SET LEADCHNL RA PACING AMPLITUDE: 2 V
MDC IDC SET LEADCHNL RV PACING AMPLITUDE: 2.5 V
MDC IDC SET LEADCHNL RV PACING PULSEWIDTH: 0.4 ms
MDC IDC STAT BRADY AP VP PERCENT: 69 %

## 2013-09-01 MED ORDER — POTASSIUM CHLORIDE CRYS ER 20 MEQ PO TBCR: 20.0000 meq | EXTENDED_RELEASE_TABLET | Freq: Two times a day (BID) | ORAL | Status: DC

## 2013-09-01 MED ORDER — AMIODARONE HCL 200 MG PO TABS: 200.0000 mg | ORAL_TABLET | Freq: Two times a day (BID) | ORAL | Status: DC

## 2013-09-01 NOTE — Assessment & Plan Note (Signed)
His Medtronic device is working normally. He is pacing approximately 85% of the time.

## 2013-09-01 NOTE — Assessment & Plan Note (Signed)
He denies anginal symptoms. Will follow. 

## 2013-09-01 NOTE — Progress Notes (Signed)
HPI [name] returns today for followup. He is a pleasant 80 yo man with a h/o symptomatic bradycardia, status post pacemaker insertion, chronic systolic heart failure, ejection fraction 40% previously, most recently down to 20%, hypertension, and dyslipidemia. In the interim, he reports that his shortness of breath has worsened, despite torsemide. The patient has also reduced his dose of anticoagulation from 5 mg twice daily down to 2.5 mg twice daily. He denies syncope. No Known Allergies   Current Outpatient Prescriptions  Medication Sig Dispense Refill  . apixaban (ELIQUIS) 5 MG TABS tablet Take 1 tablet (5 mg total) by mouth 2 (two) times daily.  60 tablet  2  . Ascorbic Acid (VITAMIN C PO) Take 1 tablet by mouth daily.       Marland Kitchen CALCIUM PO Take 1 tablet by mouth daily.       . carvedilol (COREG) 6.25 MG tablet Take 1 tablet (6.25 mg total) by mouth 2 (two) times daily with a meal.  60 tablet  0  . Cholecalciferol (VITAMIN D PO) Take 1 capsule by mouth daily.       . Coenzyme Q10 (COQ10 PO) Take 1 tablet by mouth daily.       . fish oil-omega-3 fatty acids 1000 MG capsule Take 1 g by mouth daily.       Marland Kitchen oxyCODONE (ROXICODONE) 5 MG immediate release tablet Take 1 tablet (5 mg total) by mouth every 6 (six) hours as needed for severe pain.  30 tablet  0  . torsemide (DEMADEX) 20 MG tablet TAKE 2 TABLETS IN A.M AND 2 TABLETS IN P.M  12 tablet  0  . VITAMIN E PO Take 1 capsule by mouth 2 (two) times daily.       Marland Kitchen amiodarone (PACERONE) 200 MG tablet Take 1 tablet (200 mg total) by mouth 2 (two) times daily.  180 tablet  3  . potassium chloride SA (K-DUR,KLOR-CON) 20 MEQ tablet Take 1 tablet (20 mEq total) by mouth 2 (two) times daily.  60 tablet  4   No current facility-administered medications for this visit.     Past Medical History  Diagnosis Date  . Coronary artery disease     a. s/p remote CABG x 2(VG->OM, LIMA->LAD;  b. Late 90's s/p PCI of RCA;  c. 12/09 Cath/PCI: LM 26m, 70-80d  (3.0x73mm Xience DES), LAD100p, LCX 80-90p (2.25x45mmTaxus Atom DES), RCA 100d (2.5x53mm Xience DES), VG->OM 100, LIMA->LAD nl, EF 30-35%.  . Hyperlipidemia   . Prostate cancer   . PAF (paroxysmal atrial fibrillation)   . Ischemic cardiomyopathy     a. EF 35-40% by echo 05/2012  . Symptomatic bradycardia     a. 08/2003 s/p MDT Enpulse E2DR01 Dual chamber PPM ser # [id number] H.  . Systolic CHF   . Atrial fibrillation   . CHF (congestive heart failure)   . Peripheral vascular disease     ROS:   All systems reviewed and negative except as noted in the HPI.   Past Surgical History  Procedure Laterality Date  . Insert / replace / remove pacemaker    . Doppler echocardiography  06/24/2002    EF 60-65%  . Tee without cardioversion  06/26/2012    Procedure: TRANSESOPHAGEAL ECHOCARDIOGRAM (TEE);  Surgeon: Lelon Perla, MD;  Location: [facility] ENDOSCOPY;  Service: Cardiovascular;  Laterality: [name];  . Cardioversion  06/26/2012    Procedure: CARDIOVERSION;  Surgeon: Lelon Perla, MD;  Location: [place];  Service: Cardiovascular;  Laterality: [name];  . Appendectomy    .  Angioplasty      stent placement  . Embolectomy Right 07/28/2013    Procedure: Right Popliteal and Tibial Embolectomy;  Surgeon: Angelia Mould, MD;  Location: [facility] OR;  Service: Vascular;  Laterality: Right;  . Coronary artery bypass graft  1990     Family History  Problem Relation Age of Onset  . Hypertension Mother   . Colon cancer Mother   . Cancer Mother     Colon  . Alcohol abuse Father      History   Social History  . Marital Status: Married    Spouse Name: [name]    Number of Children: 0  . Years of Education: [name]   Occupational History  . retired    Social History Main Topics  . Smoking status: Former Smoker    Quit date: 02/10/1971  . Smokeless tobacco: Never Used  . Alcohol Use: No  . Drug Use: No  . Sexual Activity: No   Other Topics Concern  . Not on file   Social History Narrative    Lives in [place] with wife.  Retired.     BP 114/72  Pulse 70  Ht 5\' 8"  (1.727 m)  Wt 160 lb (72.576 kg)  BMI 24.33 kg/m2  Physical Exam:  stable appearing elderly man,NAD HEENT: Unremarkable Neck:  No JVD, no thyromegally Lungs: scattered rales, no rhonchi. HEART:  Regular rate rhythm, no murmurs, no rubs, no clicks Abd:  soft, positive bowel sounds, no organomegally, no rebound, no guarding Ext:  2 plus pulses, no edema, no cyanosis, no clubbing Skin:  No rashes no nodules Neuro:  CN II through XII intact, motor grossly intact  DEVICE  Normal device function.  See PaceArt for details.   Assess/Plan:

## 2013-09-01 NOTE — Addendum Note (Signed)
Addended by: Burnett Kanaris on: 09/01/2013 04:06 PM   Modules accepted: Orders

## 2013-09-01 NOTE — Assessment & Plan Note (Signed)
His CHF symptoms have worsened and his EF has also worsened, now 20% by echo one month ago. I have recommended we start him on amiodarone and plan DCCV in 4-5 weeks. Hopefully his EF and CHF symptoms will improve with NSR. If not, then BiV PPM upgrade would be a consideration.

## 2013-09-01 NOTE — Patient Instructions (Addendum)
Your physician recommends that you schedule a follow-up appointment in: 5 weeks with Dr Lovena Le    Your physician has recommended you make the following change in your medication:  1) start Amiodarone 200mg  twice daily  Your physician has requested that you have an echocardiogram. Echocardiography is a painless test that uses sound waves to create images of your heart. It provides your doctor with information about the size and shape of your heart and how well your heart's chambers and valves are working. This procedure takes approximately one hour. There are no restrictions for this procedure.

## 2013-09-07 ENCOUNTER — Telehealth: Payer: Self-pay | Admitting: *Deleted

## 2013-09-07 ENCOUNTER — Other Ambulatory Visit (INDEPENDENT_AMBULATORY_CARE_PROVIDER_SITE_OTHER): Payer: Medicare Other

## 2013-09-07 DIAGNOSIS — E785 Hyperlipidemia, unspecified: Secondary | ICD-10-CM | POA: Diagnosis not present

## 2013-09-07 DIAGNOSIS — I5020 Unspecified systolic (congestive) heart failure: Secondary | ICD-10-CM

## 2013-09-07 LAB — BASIC METABOLIC PANEL
BUN: 44 mg/dL — ABNORMAL HIGH (ref 6–23)
CO2: 29 mEq/L (ref 19–32)
CREATININE: 1.9 mg/dL — AB (ref 0.4–1.5)
Calcium: 9.1 mg/dL (ref 8.4–10.5)
Chloride: 103 mEq/L (ref 96–112)
GFR: 35.77 mL/min — AB (ref >60.00)
Glucose, Bld: 119 mg/dL — ABNORMAL HIGH (ref 70–99)
Potassium: 3.6 mEq/L (ref 3.5–5.1)
Sodium: 141 mEq/L (ref 135–145)

## 2013-09-07 NOTE — Telephone Encounter (Signed)
lmtcb for lab results. Number and office hours provided

## 2013-09-08 ENCOUNTER — Telehealth: Payer: Self-pay | Admitting: Cardiovascular Disease

## 2013-09-08 DIAGNOSIS — R0602 Shortness of breath: Secondary | ICD-10-CM

## 2013-09-08 NOTE — Telephone Encounter (Signed)
Left a message to call back.

## 2013-09-08 NOTE — Telephone Encounter (Signed)
Follow Up    Pt calling following up on lab results. Please call.

## 2013-09-08 NOTE — Telephone Encounter (Signed)
Left message for patient to call office for results. °

## 2013-09-08 NOTE — Telephone Encounter (Signed)
Reviewed lab results and medication adjustments with patient who verbalized understanding and agreement.  Patient states he documented the changes while we were talking.  Patient is scheduled for ov with Dr. Acie Fredrickson 4/29 and will get repeat lab tests at that time.

## 2013-09-08 NOTE — Telephone Encounter (Signed)
New Message  Pt wife called back to retrieve results please assist

## 2013-09-09 NOTE — Telephone Encounter (Signed)
Results and plan of care reviewed with patient on 4/21 - see telephone encounter

## 2013-09-11 ENCOUNTER — Telehealth: Payer: Self-pay | Admitting: Cardiovascular Disease

## 2013-09-11 NOTE — Telephone Encounter (Signed)
New problem   Pt is having problems since he started his Amiodarone and need to speak to nurse concerning this.

## 2013-09-11 NOTE — Telephone Encounter (Signed)
Spoke with pt wife, the pt was started on amiodarone 11 days ago and has progressively felt bad. She reports nausea and fatigue. She can not give me anymore information about his symptoms. Left message for pt to call

## 2013-09-11 NOTE — Telephone Encounter (Signed)
Left pt a message to call back. 

## 2013-09-11 NOTE — Telephone Encounter (Signed)
Spoke with pt, he reports he just does not feel well. He denies cp or SOB. Pt instructed to stop the amiodarone. He has a follow up appt on 29th with dr Acie Fredrickson. Will make dr Acie Fredrickson aware. Pt agreed with this plan.

## 2013-09-14 ENCOUNTER — Other Ambulatory Visit (HOSPITAL_COMMUNITY): Payer: Medicare Other

## 2013-09-16 ENCOUNTER — Ambulatory Visit (INDEPENDENT_AMBULATORY_CARE_PROVIDER_SITE_OTHER): Payer: Medicare Other | Admitting: Cardiovascular Disease

## 2013-09-16 ENCOUNTER — Other Ambulatory Visit: Payer: Medicare Other

## 2013-09-16 ENCOUNTER — Encounter: Payer: Self-pay | Admitting: Cardiovascular Disease

## 2013-09-16 VITALS — BP 114/71 | HR 77 | Ht 68.0 in | Wt 165.4 lb

## 2013-09-16 DIAGNOSIS — I509 Heart failure, unspecified: Secondary | ICD-10-CM | POA: Diagnosis not present

## 2013-09-16 DIAGNOSIS — E785 Hyperlipidemia, unspecified: Secondary | ICD-10-CM | POA: Diagnosis not present

## 2013-09-16 DIAGNOSIS — I5023 Acute on chronic systolic (congestive) heart failure: Secondary | ICD-10-CM

## 2013-09-16 DIAGNOSIS — I5020 Unspecified systolic (congestive) heart failure: Secondary | ICD-10-CM

## 2013-09-16 DIAGNOSIS — I2510 Atherosclerotic heart disease of native coronary artery without angina pectoris: Secondary | ICD-10-CM

## 2013-09-16 MED ORDER — TORSEMIDE 20 MG PO TABS
ORAL_TABLET | ORAL | Status: DC
Start: 2013-09-16 — End: 2013-09-30

## 2013-09-16 NOTE — Assessment & Plan Note (Signed)
He's now on Eliquis.  He had stopped his anticoagulation  On his own.

## 2013-09-16 NOTE — Progress Notes (Signed)
[name] Date of Birth: 1929-04-23 Medical Record [id number]  Problems: 1. Coronary artery disease- status post CABG and PCI 2. Pacemaker placement 3. History of prostate cancer 4. Ischemic cardiopathy with an ejection fraction of 35-40% 5. Atrial fibrillation  History of Present Illness: Mr. [name] is seen back today for a work in visit . He has an ischemic CM with an EF of 35 to 40% per recent echo, known CAD remote CABG/PCI - managed medically, HLD, prostate cancer, underlying pacemaker due to bradycardia.   He was most recently admitted with CHF and cough, fever. Treated with diuretics and antibiotics. When I saw him back I got his pacemaker checked. He was in atrial flutter and the dates correlated with his recent admission. This may have been the trigger.  I started Eliquis. He was quite unsure as to taking it.   He comes back today. He is here with his wife. Feels "crummy" per the wife. He is tired. Not really short of breath. Cough has almost resolved. No chest pain. No swelling. Weight has been fairly stable at home. No extra lasix. Trying to watch his salt. Not dizzy or lightheaded. He says he is taking his medicines.   Feb. 5, 2014: He has had more "bad" days than "good" days.  He has been having some worsening dyspnea recently.  He denies any PND or orthopnea.  He is short of breath at rest - worsens with any exertion.   These symptoms have been gradually and progressively worsening over the past week or so.  July 22, 2012:   Marcus [name] was admitted with worsening dysnea recent.  He had a cardioversion.   TEE revealed: Left ventricle: Systolic function was mildly to moderately reduced. The estimated ejection fraction was in the range of 40% to 45%. Inferoposterior and lateral akinesis. - Aortic valve: No evidence of vegetation. Trivial regurgitation. - Mitral valve: No evidence of vegetation. Moderate regurgitation. - Left atrium: The atrium was moderately to  severely dilated. No evidence of thrombus in the atrial cavity or appendage. - Atrial septum: No defect or patent foramen ovale was identified. - Tricuspid valve: No evidence of vegetation. - Pulmonic valve: No evidence of vegetation.  His x-ray feeling quite well today. A TEE performed reveals some improvement of his left ventricular systolic function.  His breathing has improved on the Torsemide ( instead of Furosemide).    Sept. 15, 2014,   Marcus [name] is doing well.  No CP or dyspnea.   He has been doing on torsemide and is doing well.  He is due to a lipid profile. He   Does not want to take the  20 mg torsemide every day.  He would like to take take 10 mg a day instead of 20 . He has had mild renal insufficiency in the past and we have not use ACE-I or ARB.  His renal function had improved but now his creatinine is again 1.6.   Dec. 16, 2014:  Marcus [name] is feeling well.  Breathing well.  No CP.    September 16, 2013:  Abdominal was admitted to the hospital approximately one month ago with a pulseless leg. He was found to have a thromboembolus slightly.  He stopped his anticoagulation approximately one month prior to did not have a reason for discontinue it. He did not report any bleeding.   He's been on Eliquis since that time.  He complains of chronic dyspnea.  He thinks that this is due to his atrial fibrillation.  He has seen Dr. Lovena Le who suggested Amiodarone.    He took Amio for 11 days but had lots of GI problems ( nausea, feeling poorly).  He also has had some leg swelling.  He has had some worsening DOE.  Also dyspnea with normal activities - getting dressed, taking a shower,   His demedex dose was decreased several weeks ago because of increasing BUN and creatinine.    Echo in March 2015 showed: Left ventricle: The cavity size was mildly dilated. Wall thickness was normal. Systolic function was severely reduced. The estimated ejection fraction was in the range of 20% to 25%. Akinesis  and scarring of the inferolateral, inferior, and inferoseptal myocardium; in the distribution of the right coronary artery. The study is not technically sufficient to allow evaluation of LV diastolic function. No evidence of thrombus. - Aortic valve: Trivial regurgitation. - Left atrium: The atrium was mildly dilated. - Right ventricle: Systolic function was mildly to moderately reduced. - Right atrium: The atrium was moderately dilated   Current Outpatient Prescriptions on File Prior to Visit  Medication Sig Dispense Refill  . apixaban (ELIQUIS) 5 MG TABS tablet Take 1 tablet (5 mg total) by mouth 2 (two) times daily.  60 tablet  2  . Ascorbic Acid (VITAMIN C PO) Take 1 tablet by mouth daily.       Marland Kitchen CALCIUM PO Take 1 tablet by mouth daily.       . carvedilol (COREG) 6.25 MG tablet Take 1 tablet (6.25 mg total) by mouth 2 (two) times daily with a meal.  60 tablet  0  . Cholecalciferol (VITAMIN D PO) Take 1 capsule by mouth daily.       . Coenzyme Q10 (COQ10 PO) Take 1 tablet by mouth daily.       . fish oil-omega-3 fatty acids 1000 MG capsule Take 1 g by mouth daily.       . potassium chloride SA (K-DUR,KLOR-CON) 20 MEQ tablet Take 1 tablet (20 mEq total) by mouth 2 (two) times daily.  60 tablet  4  . torsemide (DEMADEX) 20 MG tablet TAKE 2 TABLETS IN A.M AND 2 TABLETS IN P.M  12 tablet  0  . VITAMIN E PO Take 1 capsule by mouth 2 (two) times daily.        No current facility-administered medications on file prior to visit.    No Known Allergies  Past Medical History  Diagnosis Date  . Coronary artery disease     a. s/p remote CABG x 2(VG->OM, LIMA->LAD;  b. Late 90's s/p PCI of RCA;  c. 12/09 Cath/PCI: LM 107m, 70-80d (3.0x30mm Xience DES), LAD100p, LCX 80-90p (2.25x15mmTaxus Atom DES), RCA 100d (2.5x44mm Xience DES), VG->OM 100, LIMA->LAD nl, EF 30-35%.  . Hyperlipidemia   . Prostate cancer   . PAF (paroxysmal atrial fibrillation)   . Ischemic cardiomyopathy     a. EF 35-40%  by echo 05/2012  . Symptomatic bradycardia     a. 08/2003 s/p MDT Enpulse [id number] Dual chamber PPM ser # [id number] H.  . Systolic CHF   . Atrial fibrillation   . CHF (congestive heart failure)   . Peripheral vascular disease     Past Surgical History  Procedure Laterality Date  . Insert / replace / remove pacemaker    . Doppler echocardiography  06/24/2002    EF 60-65%  . Tee without cardioversion  06/26/2012    Procedure: TRANSESOPHAGEAL ECHOCARDIOGRAM (TEE);  Surgeon: Lelon Perla, MD;  Location: [facility] ENDOSCOPY;  Service: Cardiovascular;  Laterality: [name];  . Cardioversion  06/26/2012    Procedure: CARDIOVERSION;  Surgeon: Lelon Perla, MD;  Location: [facility] ENDOSCOPY;  Service: Cardiovascular;  Laterality: [name];  . Appendectomy    . Angioplasty      stent placement  . Embolectomy Right 07/28/2013    Procedure: Right Popliteal and Tibial Embolectomy;  Surgeon: Angelia Mould, MD;  Location: [facility] OR;  Service: Vascular;  Laterality: Right;  . Coronary artery bypass graft  1990    History  Smoking status  . Former Smoker  . Quit date: 02/10/1971  Smokeless tobacco  . Never Used    History  Alcohol Use No    Family History  Problem Relation Age of Onset  . Hypertension Mother   . Colon cancer Mother   . Cancer Mother     Colon  . Alcohol abuse Father     Review of Systems: The review of systems is per the HPI.  All other systems were reviewed and are negative.  Physical Exam: BP 114/71  Pulse 77  Ht 5\' 8"  (1.727 m)  Wt 165 lb 6.4 oz (75.025 kg)  BMI 25.15 kg/m2 Patient is alert, looks chronically ill but in no acute distress. Skin is warm and dry. Color is normal.   HEENT is unremarkable. Normocephalic/atraumatic. PERRL. Sclera are nonicteric. Neck is supple. No masses. No JVD.  Lungs are clear.  Cardiac exam shows a regular rhythm.  He has occasional premature beats. Rate is controlled. Abdomen is soft.  Extremities are without edema.  Gait and ROM are intact.  No gross neurologic deficits noted.   LABORATORY DATA: Lab Results  Component Value Date   WBC 4.5 07/30/2013   HGB 10.0* 07/30/2013   HCT 31.7* 07/30/2013   PLT 152 07/30/2013   GLUCOSE 119* 09/07/2013   CHOL 160 02/02/2013   TRIG 56.0 02/02/2013   HDL 72.20 02/02/2013   LDLCALC 77 02/02/2013   ALT 19 02/02/2013   AST 30 02/02/2013   NA 141 09/07/2013   K 3.6 09/07/2013   CL 103 09/07/2013   CREATININE 1.9* 09/07/2013   BUN 44* 09/07/2013   CO2 29 09/07/2013   TSH 0.736 05/24/2012   INR 1.47 06/28/2012   EKG: Dec. 16, 2014:   AV pacing at 82.    Assessment / Plan:

## 2013-09-16 NOTE — Assessment & Plan Note (Addendum)
He has progressive CHF .  He now has an ejection fraction of around 20%. His age his creatinine is around 1.9-2. He has leg edema.  Will try to increase his Demadex to 40 mg twice a day to see if this helps with some diuresis. At this point, I would rather try to diuresis him and get him feeling better and just follow  the creatinine and BUN which will likely go higher.    We will refer him to the advanced heart failure clinic to see   Dr. Haroldine Laws  .    Ideally, we would try to get him in to normal sinus rhythm. He's had atrial fibrillation for a long time. He did not tolerate the amiodarone. I do not have any further suggestions regarding an antiarrhythmic. I will defer that decision to Dr. Lovena Le.  I'll see him again in 3 months.

## 2013-09-16 NOTE — Assessment & Plan Note (Signed)
He did not tolerate the amiodarone.  Follow up with Dr. Lovena Le.  Continue rate control for now.

## 2013-09-16 NOTE — Patient Instructions (Addendum)
Your physician has recommended you make the following change in your medication:  INCREASE Demadex to 40 mg twice daily  You have been referred to [facility]/Dr. Bensimhon - you will receive a call to schedule your appointment  Your physician recommends that you return for lab work in: 3 weeks (Thursday May 21) @ 11 - you do not have to fast for this appointment  Your physician wants you to follow-up in: 3 months with Dr. Acie Fredrickson (August 11 @ 3 pm).  You will receive a reminder letter in the mail two months in advance. If you don't receive a letter, please call our office to schedule the follow-up appointment.

## 2013-09-30 ENCOUNTER — Ambulatory Visit (HOSPITAL_COMMUNITY)
Admission: RE | Admit: 2013-09-30 | Discharge: 2013-09-30 | Disposition: A | Discharge status: Home / Self Care | Payer: Medicare Other | Source: Ambulatory Visit | Attending: Internal Medicine | Admitting: Internal Medicine

## 2013-09-30 VITALS — BP 104/60 | HR 84 | Wt 166.5 lb

## 2013-09-30 DIAGNOSIS — I2510 Atherosclerotic heart disease of native coronary artery without angina pectoris: Secondary | ICD-10-CM | POA: Diagnosis not present

## 2013-09-30 DIAGNOSIS — I5022 Chronic systolic (congestive) heart failure: Secondary | ICD-10-CM | POA: Insufficient documentation

## 2013-09-30 DIAGNOSIS — I4891 Unspecified atrial fibrillation: Secondary | ICD-10-CM | POA: Insufficient documentation

## 2013-09-30 DIAGNOSIS — I495 Sick sinus syndrome: Secondary | ICD-10-CM | POA: Diagnosis not present

## 2013-09-30 LAB — PRO B NATRIURETIC PEPTIDE: Pro B Natriuretic peptide (BNP): 4296 pg/mL — ABNORMAL HIGH (ref 0–450)

## 2013-09-30 LAB — BASIC METABOLIC PANEL
BUN: 45 mg/dL — AB (ref 6–23)
CO2: 27 mEq/L (ref 19–32)
Calcium: 9.6 mg/dL (ref 8.4–10.5)
Chloride: 100 mEq/L (ref 96–112)
Creatinine, Ser: 1.86 mg/dL — ABNORMAL HIGH (ref 0.50–1.35)
GFR, EST AFRICAN AMERICAN: 37 mL/min — AB (ref >90)
GFR, EST NON AFRICAN AMERICAN: 32 mL/min — AB (ref >90)
Glucose, Bld: 116 mg/dL — ABNORMAL HIGH (ref 70–99)
POTASSIUM: 3.8 meq/L (ref 3.7–5.3)
SODIUM: 142 meq/L (ref 137–147)

## 2013-09-30 MED ORDER — APIXABAN 2.5 MG PO TABS: 2.5000 mg | ORAL_TABLET | Freq: Two times a day (BID) | ORAL | Status: DC

## 2013-09-30 MED ORDER — TORSEMIDE 20 MG PO TABS: ORAL_TABLET | ORAL | Status: DC

## 2013-09-30 MED ORDER — AMIODARONE HCL 200 MG PO TABS: 200.0000 mg | ORAL_TABLET | Freq: Every day | ORAL | Status: DC

## 2013-09-30 NOTE — Patient Instructions (Signed)
Make sure Eliquis is 2.5 mg Twice daily   Increase Torsemide to 60 mg (3 tabs) in AM and 40 mg (2 tabs) in PM  Restart Amiodarone 200 mg daily  Labs today  Your physician recommends that you schedule a follow-up appointment in: 2 weeks with labs

## 2013-09-30 NOTE — Progress Notes (Signed)
Patient ID: Marcus [name], male   DOB: July 23, 1928, 80 y.o.   MRN: [id number] Primary cardiologist: Dr. Acie [name]  80 yo with history of pacemaker for bradycardia, paroxysmal atrial fibrillation, CAD s/p CABG and PCIs, and ischemic cardiomyopathy presents for CHF clinic evaluation.  Over the last few months, patient has had a symptomatic decline.  He seems to be in atrial fibrillation more often (but not today) which may contribute to symptoms.  He took amiodarone for about 10 days but felt sick/nauseated after starting it so stopped it. Recent PCM interrogation showed that he is RV pacing 85% of the time. He has had progressive dypsnea.  He is short of breath walking around in his house or with any incline.  No chest pain.  Mild orthopnea, no PND.  Weight is up 1 lb since his prior appointment.  Torsemide has been increased over the last several weeks without much effect other than worsening creatinine.   Labs (9/14). LDL 77 Labs (4/15): K 3.6, creatinine 1.9  ECG: a-paced, v-paced (not atrial fibrillation).   PMH:  1. Symptomatic bradycardia: Medtronic dual chamber pacemaker.  2. HTN 3. Hyperlipidemia 4. Atrial fibrillation: History of prior DCCV.  Paroxysmal.  On Eliquis.  Has history of cardioembolism to right leg with right popliteal and tibial embolectomy (had been off Eliquis).  Nausea with amiodarone.  5. Prostate cancer 6. CAD: CABG remotely with SVG-OM and LIMA-LAD.  Late 1990s had PCI to RCA.  In 12/09 had LHC with total occlusion of SVG-OM, patent LIMA-LAD, DES to LCx and DES to RCA.   7. Ischemic cardiomyopathy: Echo (3/15) with EF 20-25%, wall motion abnormalities noted, mild to moderately decreased RV systolic function.  8. CKD  SH: Married, nonsmoker, lives in [place]  [place]: CAD  ROS: All systems reviewed and negative except as per HPI.   Current Outpatient Prescriptions  Medication Sig Dispense Refill  . apixaban (ELIQUIS) 2.5 MG TABS tablet Take 1 tablet (2.5 mg total) by  mouth 2 (two) times daily.  60 tablet  2  . Ascorbic Acid (VITAMIN C PO) Take 1 tablet by mouth daily.       Marland Kitchen aspirin 81 MG tablet Take 81 mg by mouth daily.      Marland Kitchen CALCIUM PO Take 1 tablet by mouth daily.       . carvedilol (COREG) 6.25 MG tablet Take 1 tablet (6.25 mg total) by mouth 2 (two) times daily with a meal.  60 tablet  0  . Cholecalciferol (VITAMIN D PO) Take 1 capsule by mouth daily.       . Coenzyme Q10 (COQ10 PO) Take 1 tablet by mouth daily.       . fish oil-omega-3 fatty acids 1000 MG capsule Take 1 g by mouth daily.       . potassium chloride SA (K-DUR,KLOR-CON) 20 MEQ tablet Take 1 tablet (20 mEq total) by mouth 2 (two) times daily.  60 tablet  4  . torsemide (DEMADEX) 20 MG tablet Take 60 mg (3 tabs) in AM and 40 mg (2 tabs) in PM  150 tablet  3  . VITAMIN E PO Take 1 capsule by mouth 2 (two) times daily.       Marland Kitchen amiodarone (PACERONE) 200 MG tablet Take 1 tablet (200 mg total) by mouth daily.  30 tablet  3   No current facility-administered medications for this encounter.    BP 104/60  Pulse 84  Wt 166 lb 8 oz (75.524 kg)  SpO2 100% General:  NAD Neck: JVP 12 cm, no thyromegaly or thyroid nodule.  Lungs: Clear to auscultation bilaterally with normal respiratory effort. CV: Nondisplaced PMI.  Heart regular S1/S2, +S3, 1/6 HSM apex.  1+ edema 1/2 to knees bilaterally.  No carotid bruit.  Normal pedal pulses.  Abdomen: Soft, nontender, no hepatosplenomegaly, no distention.  Skin: Intact without lesions or rashes.  Neurologic: Alert and oriented x 3.  Psych: Normal affect. Extremities: No clubbing or cyanosis.   Assessment/Plan: 1. Chronic systolic CHF: Ischemic cardiomyopathy, EF 20-25%.  NYHA class IIIb symptoms.  He is volume overloaded on exam.  - Continue Coreg at current dose.  No ACEI/spironolactone for now with elevated creatinine.  - Increase torsemide to 60 qam, 40 qpm.  I am not sure how useful this will be given prominent cardiorenal syndrome.  It is  possible that he will end up needing admission for inotropes and IV diuresis.  - BMET/BNP today and repeat in 2 wks.  - Ultimately, I think that we need to keep Mr Marcus [name] in [place], and as he is RV pacing around 85% of the time (including today), he needs CRT.  CRT is going to be less useful, however, if he has frequent atrial fibrillation.  He is actually not in atrial fibrillation currently.  Options here would be maintenance of NSR with anti-arrhythmic + CRT versus AV nodal ablation + CRT.  He had nausea after starting amiodarone at 200 mg bid.  He wants to try amiodarone again (it is possible that nausea has been due to CHF with hepatic/intestinal congestion) at 200 mg daily.  If he has atrial fibrillation despite amiodarone or does not tolerate amiodarone, Tikosyn is probably not a great option with labile renal function.  In that case, would consider AV nodal ablation/BiV pacing.  I will discuss with Dr. Lovena [name].  2. Atrial fibrillation: Paroxysmal.  He is a-pacing today.  See discussion above.  Will retry amiodarone at 200 mg daily.  If he does not tolerate or it is not successful at NSR maintenance, would consider AV nodal ablation. Given elevated creatinine and age > 53, needs to decrease Eliquis to 2.5 mg bid.  3. CAD: Stable.  As he is on Eliquis with stable CAD, could consider stopping ASA.  He is currently not on a statin.  4. Sick sinus syndrome with Medtronic PCM: As above, think he needs upgrade to BiV device.   Marcus [name] 10/01/2013

## 2013-10-06 ENCOUNTER — Other Ambulatory Visit (HOSPITAL_COMMUNITY): Payer: Self-pay | Admitting: Adult Health

## 2013-10-06 ENCOUNTER — Encounter (HOSPITAL_COMMUNITY): Payer: Self-pay | Admitting: *Deleted

## 2013-10-06 ENCOUNTER — Telehealth (HOSPITAL_COMMUNITY): Payer: Self-pay | Admitting: Cardiology

## 2013-10-06 ENCOUNTER — Ambulatory Visit (HOSPITAL_BASED_OUTPATIENT_CLINIC_OR_DEPARTMENT_OTHER)
Admission: RE | Admit: 2013-10-06 | Discharge: 2013-10-06 | Disposition: A | Discharge status: Home / Self Care | Payer: Medicare Other | Source: Ambulatory Visit | Attending: Internal Medicine | Admitting: Internal Medicine

## 2013-10-06 ENCOUNTER — Inpatient Hospital Stay (HOSPITAL_COMMUNITY)
Admission: AD | Admit: 2013-10-06 | Discharge: 2013-10-13 | DRG: 242 | Disposition: A | Discharge status: Home Health | Payer: Medicare Other | Source: Ambulatory Visit | Attending: Cardiology | Admitting: Cardiology

## 2013-10-06 ENCOUNTER — Other Ambulatory Visit: Payer: Self-pay

## 2013-10-06 ENCOUNTER — Encounter (HOSPITAL_COMMUNITY): Payer: Self-pay

## 2013-10-06 VITALS — BP 107/61 | HR 76 | Resp 20 | Wt 167.5 lb

## 2013-10-06 DIAGNOSIS — N039 Chronic nephritic syndrome with unspecified morphologic changes: Secondary | ICD-10-CM | POA: Diagnosis present

## 2013-10-06 DIAGNOSIS — Z86718 Personal history of other venous thrombosis and embolism: Secondary | ICD-10-CM

## 2013-10-06 DIAGNOSIS — I5023 Acute on chronic systolic (congestive) heart failure: Secondary | ICD-10-CM

## 2013-10-06 DIAGNOSIS — E785 Hyperlipidemia, unspecified: Secondary | ICD-10-CM | POA: Diagnosis present

## 2013-10-06 DIAGNOSIS — I2589 Other forms of chronic ischemic heart disease: Secondary | ICD-10-CM | POA: Diagnosis present

## 2013-10-06 DIAGNOSIS — Z9861 Coronary angioplasty status: Secondary | ICD-10-CM | POA: Diagnosis not present

## 2013-10-06 DIAGNOSIS — I447 Left bundle-branch block, unspecified: Secondary | ICD-10-CM | POA: Diagnosis not present

## 2013-10-06 DIAGNOSIS — I959 Hypotension, unspecified: Secondary | ICD-10-CM | POA: Diagnosis present

## 2013-10-06 DIAGNOSIS — I10 Essential (primary) hypertension: Secondary | ICD-10-CM | POA: Diagnosis present

## 2013-10-06 DIAGNOSIS — I4891 Unspecified atrial fibrillation: Secondary | ICD-10-CM | POA: Diagnosis not present

## 2013-10-06 DIAGNOSIS — Z8249 Family history of ischemic heart disease and other diseases of the circulatory system: Secondary | ICD-10-CM

## 2013-10-06 DIAGNOSIS — R0989 Other specified symptoms and signs involving the circulatory and respiratory systems: Secondary | ICD-10-CM | POA: Diagnosis not present

## 2013-10-06 DIAGNOSIS — Z87891 Personal history of nicotine dependence: Secondary | ICD-10-CM

## 2013-10-06 DIAGNOSIS — N183 Chronic kidney disease, stage 3 unspecified: Secondary | ICD-10-CM | POA: Diagnosis present

## 2013-10-06 DIAGNOSIS — I739 Peripheral vascular disease, unspecified: Secondary | ICD-10-CM | POA: Diagnosis present

## 2013-10-06 DIAGNOSIS — I4892 Unspecified atrial flutter: Secondary | ICD-10-CM | POA: Diagnosis present

## 2013-10-06 DIAGNOSIS — D631 Anemia in chronic kidney disease: Secondary | ICD-10-CM | POA: Diagnosis present

## 2013-10-06 DIAGNOSIS — J9819 Other pulmonary collapse: Secondary | ICD-10-CM | POA: Diagnosis not present

## 2013-10-06 DIAGNOSIS — Z79899 Other long term (current) drug therapy: Secondary | ICD-10-CM | POA: Diagnosis not present

## 2013-10-06 DIAGNOSIS — N189 Chronic kidney disease, unspecified: Secondary | ICD-10-CM

## 2013-10-06 DIAGNOSIS — I495 Sick sinus syndrome: Secondary | ICD-10-CM | POA: Diagnosis present

## 2013-10-06 DIAGNOSIS — Z9089 Acquired absence of other organs: Secondary | ICD-10-CM | POA: Diagnosis not present

## 2013-10-06 DIAGNOSIS — Z7901 Long term (current) use of anticoagulants: Secondary | ICD-10-CM | POA: Diagnosis not present

## 2013-10-06 DIAGNOSIS — Z951 Presence of aortocoronary bypass graft: Secondary | ICD-10-CM | POA: Diagnosis not present

## 2013-10-06 DIAGNOSIS — J811 Chronic pulmonary edema: Secondary | ICD-10-CM | POA: Diagnosis not present

## 2013-10-06 DIAGNOSIS — I2510 Atherosclerotic heart disease of native coronary artery without angina pectoris: Secondary | ICD-10-CM

## 2013-10-06 DIAGNOSIS — Z8546 Personal history of malignant neoplasm of prostate: Secondary | ICD-10-CM

## 2013-10-06 DIAGNOSIS — Z6379 Other stressful life events affecting family and household: Secondary | ICD-10-CM

## 2013-10-06 DIAGNOSIS — E8779 Other fluid overload: Secondary | ICD-10-CM | POA: Diagnosis not present

## 2013-10-06 DIAGNOSIS — I509 Heart failure, unspecified: Secondary | ICD-10-CM

## 2013-10-06 DIAGNOSIS — Z800 Family history of malignant neoplasm of digestive organs: Secondary | ICD-10-CM | POA: Diagnosis not present

## 2013-10-06 DIAGNOSIS — R0609 Other forms of dyspnea: Secondary | ICD-10-CM | POA: Diagnosis not present

## 2013-10-06 DIAGNOSIS — I442 Atrioventricular block, complete: Secondary | ICD-10-CM | POA: Diagnosis not present

## 2013-10-06 DIAGNOSIS — I255 Ischemic cardiomyopathy: Secondary | ICD-10-CM | POA: Diagnosis present

## 2013-10-06 DIAGNOSIS — I059 Rheumatic mitral valve disease, unspecified: Secondary | ICD-10-CM | POA: Diagnosis not present

## 2013-10-06 DIAGNOSIS — I5022 Chronic systolic (congestive) heart failure: Secondary | ICD-10-CM | POA: Diagnosis not present

## 2013-10-06 DIAGNOSIS — I130 Hypertensive heart and chronic kidney disease with heart failure and stage 1 through stage 4 chronic kidney disease, or unspecified chronic kidney disease: Secondary | ICD-10-CM | POA: Diagnosis not present

## 2013-10-06 DIAGNOSIS — Z950 Presence of cardiac pacemaker: Secondary | ICD-10-CM | POA: Diagnosis present

## 2013-10-06 LAB — COMPREHENSIVE METABOLIC PANEL
ALBUMIN: 3.6 g/dL (ref 3.5–5.2)
ALK PHOS: 53 U/L (ref 39–117)
ALT: 17 U/L (ref 0–53)
AST: 23 U/L (ref 0–37)
BUN: 46 mg/dL — ABNORMAL HIGH (ref 6–23)
CALCIUM: 9.4 mg/dL (ref 8.4–10.5)
CO2: 28 mEq/L (ref 19–32)
Chloride: 103 mEq/L (ref 96–112)
Creatinine, Ser: 2.02 mg/dL — ABNORMAL HIGH (ref 0.50–1.35)
GFR calc non Af Amer: 29 mL/min — ABNORMAL LOW (ref >90)
GFR, EST AFRICAN AMERICAN: 34 mL/min — AB (ref >90)
GLUCOSE: 84 mg/dL (ref 70–99)
POTASSIUM: 3.6 meq/L — AB (ref 3.7–5.3)
Sodium: 146 mEq/L (ref 137–147)
TOTAL PROTEIN: 7.6 g/dL (ref 6.0–8.3)
Total Bilirubin: 1.1 mg/dL (ref 0.3–1.2)

## 2013-10-06 LAB — CBC
HCT: 34.6 % — ABNORMAL LOW (ref 39.0–52.0)
Hemoglobin: 11 g/dL — ABNORMAL LOW (ref 13.0–17.0)
MCH: 26.1 pg (ref 26.0–34.0)
MCHC: 31.8 g/dL (ref 30.0–36.0)
MCV: 82.2 fL (ref 78.0–100.0)
PLATELETS: 165 10*3/uL (ref 150–400)
RBC: 4.21 MIL/uL — AB (ref 4.22–5.81)
RDW: 18.1 % — AB (ref 11.5–15.5)
WBC: 4.3 10*3/uL (ref 4.0–10.5)

## 2013-10-06 LAB — PRO B NATRIURETIC PEPTIDE: Pro B Natriuretic peptide (BNP): 6552 pg/mL — ABNORMAL HIGH (ref 0–450)

## 2013-10-06 LAB — MAGNESIUM: Magnesium: 2.5 mg/dL (ref 1.5–2.5)

## 2013-10-06 LAB — CREATININE, SERUM
Creatinine, Ser: 2.01 mg/dL — ABNORMAL HIGH (ref 0.50–1.35)
GFR, EST AFRICAN AMERICAN: 34 mL/min — AB (ref >90)
GFR, EST NON AFRICAN AMERICAN: 29 mL/min — AB (ref >90)

## 2013-10-06 LAB — TSH: TSH: 2.73 u[IU]/mL (ref 0.350–4.500)

## 2013-10-06 MED ORDER — APIXABAN 2.5 MG PO TABS
2.5000 mg | ORAL_TABLET | Freq: Two times a day (BID) | ORAL | Status: DC
  Administered 2013-10-07 – 2013-10-13 (×13): 2.5 mg via ORAL
  Filled 2013-10-06 (×17): qty 1

## 2013-10-06 MED ORDER — SODIUM CHLORIDE 0.9 % IJ SOLN
3.0000 mL | INTRAMUSCULAR | Status: DC | PRN
  Administered 2013-10-09: 3 mL via INTRAVENOUS

## 2013-10-06 MED ORDER — ASPIRIN EC 81 MG PO TBEC
81.0000 mg | DELAYED_RELEASE_TABLET | Freq: Every day | ORAL | Status: DC
  Administered 2013-10-06 – 2013-10-08 (×3): 81 mg via ORAL
  Filled 2013-10-06 (×4): qty 1

## 2013-10-06 MED ORDER — POTASSIUM CHLORIDE CRYS ER 20 MEQ PO TBCR
20.0000 meq | EXTENDED_RELEASE_TABLET | Freq: Two times a day (BID) | ORAL | Status: DC
  Administered 2013-10-06 – 2013-10-12 (×13): 20 meq via ORAL
  Filled 2013-10-06 (×17): qty 1

## 2013-10-06 MED ORDER — CARVEDILOL 3.125 MG PO TABS
3.1250 mg | ORAL_TABLET | Freq: Two times a day (BID) | ORAL | Status: DC
  Administered 2013-10-06: 3.125 mg via ORAL
  Filled 2013-10-06 (×4): qty 1

## 2013-10-06 MED ORDER — MILRINONE IN DEXTROSE 20 MG/100ML IV SOLN
0.3750 ug/kg/min | INTRAVENOUS | Status: DC
  Administered 2013-10-06 – 2013-10-07 (×3): 0.25 ug/kg/min via INTRAVENOUS
  Administered 2013-10-08 – 2013-10-11 (×6): 0.375 ug/kg/min via INTRAVENOUS
  Filled 2013-10-06 (×9): qty 100

## 2013-10-06 MED ORDER — SODIUM CHLORIDE 0.9 % IJ SOLN
3.0000 mL | Freq: Two times a day (BID) | INTRAMUSCULAR | Status: DC
  Administered 2013-10-06 – 2013-10-10 (×6): 3 mL via INTRAVENOUS

## 2013-10-06 MED ORDER — FUROSEMIDE 10 MG/ML IJ SOLN
60.0000 mg | Freq: Three times a day (TID) | INTRAMUSCULAR | Status: DC
  Administered 2013-10-06 – 2013-10-07 (×4): 60 mg via INTRAVENOUS
  Filled 2013-10-06 (×4): qty 6

## 2013-10-06 MED ORDER — ACETAMINOPHEN 325 MG PO TABS: 650.0000 mg | ORAL_TABLET | ORAL | Status: DC | PRN

## 2013-10-06 MED ORDER — ONDANSETRON HCL 4 MG/2ML IJ SOLN: 4.0000 mg | Freq: Four times a day (QID) | INTRAMUSCULAR | Status: DC | PRN

## 2013-10-06 MED ORDER — SODIUM CHLORIDE 0.9 % IV SOLN
250.0000 mL | INTRAVENOUS | Status: DC | PRN
  Administered 2013-10-11: 250 mL via INTRAVENOUS

## 2013-10-06 MED ORDER — AMIODARONE HCL 200 MG PO TABS
200.0000 mg | ORAL_TABLET | Freq: Every day | ORAL | Status: DC
  Filled 2013-10-06: qty 1

## 2013-10-06 NOTE — Telephone Encounter (Signed)
pots wife returned call States pt is having increased SOB, weight trending up Decreased urine out put Pt states he feels very "crummy"/bad  Pt added on for further evaluation as Dr.McLean note states pt may need IV diuresis or admit

## 2013-10-06 NOTE — Addendum Note (Signed)
Encounter addended by: Renee Pain, RN on: 10/06/2013  2:15 PM<BR>     Documentation filed: Demographics Visit

## 2013-10-06 NOTE — Telephone Encounter (Signed)
Pt sch to come today at 1:30

## 2013-10-06 NOTE — Telephone Encounter (Signed)
After hours message : WAS SEEN LAST WEEK, NOT GETTING ANY BETTER CAN HE BE SEEN? Attempting to contact pt regarding message, [facility]

## 2013-10-06 NOTE — Addendum Note (Signed)
Encounter addended by: Renee Pain, RN on: 10/06/2013  2:12 PM<BR>     Documentation filed: Demographics Visit

## 2013-10-06 NOTE — Progress Notes (Signed)
Patient ID: Marcus [name], male   DOB: [birth year], 80 y.o.   MRN: [id number] Primary cardiologist: Dr. Acie Fredrickson  80 yo with history of pacemaker for bradycardia, paroxysmal atrial fibrillation, CAD s/p CABG and PCIs, and ischemic cardiomyopathy presents for CHF clinic evaluation.  Over the last few months, patient has had a symptomatic decline.  He seems to be in atrial fibrillation more often (but not persistently) which may contribute to symptoms.  He took amiodarone for about 10 days but felt sick/nauseated after starting it so stopped it. Recent PCM interrogation showed that he is RV pacing 85% of the time. He has had progressive dypsnea.  He is short of breath walking around in his house or with any incline.  No chest pain.  He has orthopnea.  Torsemide has been increased over the last several weeks without much effect other than worsening creatinine.   At last appointment, patient was not in atrial fibrillation.  Therefore, I tried him again on amiodarone at 200 mg daily.  He seems to be tolerating this reasonably well without nausea.  I again increased his torsemide.  I spoke with Dr. Lovena Le about CRT upgrade as well.  Since last appointment, he has not felt any better and seems to be getting worse.  Dyspnea with almost any exertion.  Leg swelling continues to get worse.    Labs (9/14). LDL 77 Labs (4/15): K 3.6, creatinine 1.9 Labs (5/15): K 3.8, creatinine 1.86  ECG: a-paced, v-paced (last appt)  PMH:  1. Symptomatic bradycardia: Medtronic dual chamber pacemaker.  2. HTN 3. Hyperlipidemia 4. Atrial fibrillation: History of prior DCCV.  Paroxysmal.  On Eliquis.  Has history of cardioembolism to right leg with right popliteal and tibial embolectomy (had been off Eliquis).  Nausea with amiodarone.  5. Prostate cancer 6. CAD: CABG remotely with SVG-OM and LIMA-LAD.  Late 1990s had PCI to RCA.  In 12/09 had LHC with total occlusion of SVG-OM, patent LIMA-LAD, DES to LCx and DES to RCA.   7.  Ischemic cardiomyopathy: Echo (3/15) with EF 20-25%, wall motion abnormalities noted, mild to moderately decreased RV systolic function.  8. CKD  SH: Married, nonsmoker, lives in [place]  [place]: CAD  ROS: All systems reviewed and negative except as per HPI.   Current Outpatient Prescriptions  Medication Sig Dispense Refill  . amiodarone (PACERONE) 200 MG tablet Take 1 tablet (200 mg total) by mouth daily.  30 tablet  3  . apixaban (ELIQUIS) 2.5 MG TABS tablet Take 1 tablet (2.5 mg total) by mouth 2 (two) times daily.  60 tablet  2  . Ascorbic Acid (VITAMIN C PO) Take 1 tablet by mouth daily.       Marland Kitchen aspirin 81 MG tablet Take 81 mg by mouth daily.      Marland Kitchen CALCIUM PO Take 1 tablet by mouth daily.       . carvedilol (COREG) 6.25 MG tablet Take 1 tablet (6.25 mg total) by mouth 2 (two) times daily with a meal.  60 tablet  0  . Cholecalciferol (VITAMIN D PO) Take 1 capsule by mouth daily.       . Coenzyme Q10 (COQ10 PO) Take 1 tablet by mouth daily.       . fish oil-omega-3 fatty acids 1000 MG capsule Take 1 g by mouth daily.       . potassium chloride SA (K-DUR,KLOR-CON) 20 MEQ tablet Take 1 tablet (20 mEq total) by mouth 2 (two) times daily.  60 tablet  4  .  torsemide (DEMADEX) 20 MG tablet Take 60 mg (3 tabs) in AM and 40 mg (2 tabs) in PM  150 tablet  3  . VITAMIN E PO Take 1 capsule by mouth 2 (two) times daily.        No current facility-administered medications for this encounter.    BP 107/61  Pulse 76  Resp 20  Wt 167 lb 8 oz (75.978 kg)  SpO2 98% General: NAD Neck: JVP 12 cm, no thyromegaly or thyroid nodule.  Lungs: Clear to auscultation bilaterally with normal respiratory effort. CV: Nondisplaced PMI.  Heart regular S1/S2, +S3, 2/6 HSM apex.  2+ edema 1/2 to knees bilaterally.  No carotid bruit.  Normal pedal pulses.  Abdomen: Soft, nontender, no hepatosplenomegaly, no distention.  Skin: Intact without lesions or rashes.  Neurologic: Alert and oriented x 3.  Psych: Normal  affect. Extremities: No clubbing or cyanosis.   Assessment/Plan: 1. Acute on chronic systolic CHF: Ischemic cardiomyopathy, EF 20-25%.  NYHA class IV symptoms.  He remains volume overloaded on exam. He is failing outpatient therapy.  - I am going to admit Marcus [name] today.  He has cardiorenal syndrome with borderline blood pressure, concern for low output.  I will start him on milrinone 0.25 mcg/kg/min + Lasix 60 mg IV every 8 hours.   - I will place PICC to follow co-ox and CVP.  - Decrease Coreg to 3.125 mg bid.  No ACEI/spironolactone for now with elevated creatinine.  - BMET/BNP and CXR today.   - Ultimately, I think that we need to keep Marcus [name] in [place], and as he is RV pacing around 85% of the time (including today), he needs CRT.  CRT is going to be less useful, however, if he has frequent atrial fibrillation.   Options here would be maintenance of NSR with anti-arrhythmic + CRT versus AV nodal ablation + CRT.  He had nausea after starting amiodarone at 200 mg bid but is tolerating amiodarone 200 mg daily.  If he has atrial fibrillation despite amiodarone or does not tolerate amiodarone, Tikosyn is probably not a great option with labile renal function.  In that case, would consider AV nodal ablation/BiV pacing.  I will have EP see him in the hospital, would like CRT upgrade soon if possible.  2. Atrial fibrillation: Paroxysmal.  Will get ECG today.  See discussion above. He is so far tolerating amiodarone 200 mg daily.  If he does not tolerate or it is not successful at NSR maintenance, would consider AV nodal ablation. Given elevated creatinine and age > 63, needs to be on Eliquis with dose 2.5 mg bid.  3. CAD: Stable.  As he is on Eliquis with stable CAD, could consider stopping ASA.  He is currently not on a statin.  4. Sick sinus syndrome with Medtronic PCM: As above, think he needs upgrade to BiV device.   [name] 10/06/2013

## 2013-10-06 NOTE — H&P (Signed)
Primary cardiologist: Dr. Acie Fredrickson   Mr Tripplett is an 80 yo with history of pacemaker for bradycardia, paroxysmal atrial fibrillation, CAD s/p CABG and PCIs, and ischemic cardiomyopathy presents for CHF clinic evaluation. Over the last few months, patient has had a symptomatic decline. He seems to be in atrial fibrillation more often (but not persistently) which may contribute to symptoms. He took amiodarone for about 10 days but felt sick/nauseated after starting it so stopped it. Recent PCM interrogation showed that he is RV pacing 85% of the time. He has had progressive dypsnea. He is short of breath walking around in his house or with any incline. No chest pain. He has orthopnea. Torsemide has been increased over the last several weeks without much effect other than worsening creatinine. At last appointment, patient was not in atrial fibrillation. Therefore, I tried him again on amiodarone at 200 mg daily. He seems to be tolerating this reasonably well without nausea. I again increased his torsemide. I spoke with Dr. Lovena Le about CRT upgrade as well. Since last appointment, he has not felt any better and seems to be getting worse. Dyspnea with almost any exertion. Leg swelling continues to get worse.   Labs (9/14). LDL 77  Labs (4/15): K 3.6, creatinine 1.9  Labs (5/15): K 3.8, creatinine 1.86  ECG: a-paced, v-paced (last appt)  PMH:  1. Symptomatic bradycardia: Medtronic dual chamber pacemaker.  2. HTN  3. Hyperlipidemia  4. Atrial fibrillation: History of prior DCCV. Paroxysmal. On Eliquis. Has history of cardioembolism to right leg with right popliteal and tibial embolectomy (had been off Eliquis). Nausea with amiodarone.  5. Prostate cancer  6. CAD: CABG remotely with SVG-OM and LIMA-LAD. Late 1990s had PCI to RCA. In 12/09 had LHC with total occlusion of SVG-OM, patent LIMA-LAD, DES to LCx and DES to RCA.  7. Ischemic cardiomyopathy: Echo (3/15) with EF 20-25%, wall motion abnormalities  noted, mild to moderately decreased RV systolic function.  8. CKD  SH: Married, nonsmoker, lives in [place]  [place]: CAD  ROS: All systems reviewed and negative except as per HPI.  Current Outpatient Prescriptions   Medication  Sig  Dispense  Refill   .  amiodarone (PACERONE) 200 MG tablet  Take 1 tablet (200 mg total) by mouth daily.  30 tablet  3   .  apixaban (ELIQUIS) 2.5 MG TABS tablet  Take 1 tablet (2.5 mg total) by mouth 2 (two) times daily.  60 tablet  2   .  Ascorbic Acid (VITAMIN C PO)  Take 1 tablet by mouth daily.     Marland Kitchen  aspirin 81 MG tablet  Take 81 mg by mouth daily.     Marland Kitchen  CALCIUM PO  Take 1 tablet by mouth daily.     .  carvedilol (COREG) 6.25 MG tablet  Take 1 tablet (6.25 mg total) by mouth 2 (two) times daily with a meal.  60 tablet  0   .  Cholecalciferol (VITAMIN D PO)  Take 1 capsule by mouth daily.     .  Coenzyme Q10 (COQ10 PO)  Take 1 tablet by mouth daily.     .  fish oil-omega-3 fatty acids 1000 MG capsule  Take 1 g by mouth daily.     .  potassium chloride SA (K-DUR,KLOR-CON) 20 MEQ tablet  Take 1 tablet (20 mEq total) by mouth 2 (two) times daily.  60 tablet  4   .  torsemide (DEMADEX) 20 MG tablet  Take 60  mg (3 tabs) in AM and 40 mg (2 tabs) in PM  150 tablet  3   .  VITAMIN E PO  Take 1 capsule by mouth 2 (two) times daily.      No current facility-administered medications for this encounter.     BP 107/61  Pulse 76  Resp 20  Wt 167 lb 8 oz (75.978 kg)  SpO2 98%  General: NAD  Neck: JVP 12 cm, no thyromegaly or thyroid nodule.  Lungs: Clear to auscultation bilaterally with normal respiratory effort.  CV: Nondisplaced PMI. Heart regular S1/S2, +S3, 2/6 HSM apex. 2+ edema 1/2 to knees bilaterally. No carotid bruit. Normal pedal pulses.  Abdomen: Soft, nontender, no hepatosplenomegaly, no distention.  Skin: Intact without lesions or rashes.  Neurologic: Alert and oriented x 3.  Psych: Normal affect.  Extremities: No clubbing or cyanosis.     Assessment/Plan:  1. Acute on chronic systolic CHF: Ischemic cardiomyopathy, EF 20-25%. NYHA class IV symptoms. He remains volume overloaded on exam. He is failing outpatient therapy.  - I am going to admit Mr Blomme today. He has cardiorenal syndrome with borderline blood pressure, concern for low output. I will start him on milrinone 0.25 mcg/kg/min + Lasix 60 mg IV every 8 hours.  - I will place PICC to follow co-ox and CVP.  - Decrease Coreg to 3.125 mg bid. No ACEI/spironolactone for now with elevated creatinine.  - BMET/BNP and CXR today.  - Ultimately, I think that we need to keep Mr Lay in [place], and as he is RV pacing around 85% of the time (including today), he needs CRT. CRT is going to be less useful, however, if he has frequent atrial fibrillation. Options here would be maintenance of NSR with anti-arrhythmic + CRT versus AV nodal ablation + CRT. He had nausea after starting amiodarone at 200 mg bid but is tolerating amiodarone 200 mg daily. If he has atrial fibrillation despite amiodarone or does not tolerate amiodarone, Tikosyn is probably not a great option with labile renal function. In that case, would consider AV nodal ablation/BiV pacing. I will have EP see him in the hospital, would like CRT upgrade soon if possible.  2. Atrial fibrillation: Paroxysmal. Will get ECG today. See discussion above. He is so far tolerating amiodarone 200 mg daily. If he does not tolerate or it is not successful at NSR maintenance, would consider AV nodal ablation. Given elevated creatinine and age > 24, needs to be on Eliquis with dose 2.5 mg bid.  3. CAD: Stable. As he is on Eliquis with stable CAD, could consider stopping ASA. He is currently not on a statin.  4. Sick sinus syndrome with Medtronic PCM: As above, think he needs upgrade to BiV device.    Amy D Clegg NP-C  3:23 PM

## 2013-10-07 ENCOUNTER — Inpatient Hospital Stay (HOSPITAL_COMMUNITY): Payer: Medicare Other

## 2013-10-07 ENCOUNTER — Telehealth (HOSPITAL_COMMUNITY): Payer: Self-pay | Admitting: Cardiology

## 2013-10-07 DIAGNOSIS — I4891 Unspecified atrial fibrillation: Secondary | ICD-10-CM | POA: Diagnosis not present

## 2013-10-07 DIAGNOSIS — I2510 Atherosclerotic heart disease of native coronary artery without angina pectoris: Secondary | ICD-10-CM | POA: Diagnosis not present

## 2013-10-07 DIAGNOSIS — I5023 Acute on chronic systolic (congestive) heart failure: Secondary | ICD-10-CM

## 2013-10-07 DIAGNOSIS — R0609 Other forms of dyspnea: Secondary | ICD-10-CM | POA: Diagnosis not present

## 2013-10-07 DIAGNOSIS — J811 Chronic pulmonary edema: Secondary | ICD-10-CM | POA: Diagnosis not present

## 2013-10-07 DIAGNOSIS — I442 Atrioventricular block, complete: Secondary | ICD-10-CM

## 2013-10-07 DIAGNOSIS — I059 Rheumatic mitral valve disease, unspecified: Secondary | ICD-10-CM

## 2013-10-07 LAB — BASIC METABOLIC PANEL
BUN: 43 mg/dL — ABNORMAL HIGH (ref 6–23)
CALCIUM: 8.8 mg/dL (ref 8.4–10.5)
CO2: 28 mEq/L (ref 19–32)
Chloride: 105 mEq/L (ref 96–112)
Creatinine, Ser: 1.76 mg/dL — ABNORMAL HIGH (ref 0.50–1.35)
GFR calc Af Amer: 40 mL/min — ABNORMAL LOW (ref >90)
GFR calc non Af Amer: 34 mL/min — ABNORMAL LOW (ref >90)
Glucose, Bld: 95 mg/dL (ref 70–99)
Potassium: 3.5 mEq/L — ABNORMAL LOW (ref 3.7–5.3)
SODIUM: 143 meq/L (ref 137–147)

## 2013-10-07 LAB — CARBOXYHEMOGLOBIN
CARBOXYHEMOGLOBIN: 1.6 % — AB (ref 0.5–1.5)
METHEMOGLOBIN: 0.9 % (ref 0.0–1.5)
O2 Saturation: 47.2 %
TOTAL HEMOGLOBIN: 11 g/dL — AB (ref 13.5–18.0)

## 2013-10-07 LAB — MRSA PCR SCREENING: MRSA BY PCR: NEGATIVE

## 2013-10-07 MED ORDER — FUROSEMIDE 10 MG/ML IJ SOLN
INTRAMUSCULAR | Status: AC
  Filled 2013-10-07: qty 8

## 2013-10-07 MED ORDER — SODIUM CHLORIDE 0.9 % IR SOLN
80.0000 mg | Status: DC
  Filled 2013-10-07: qty 2

## 2013-10-07 MED ORDER — CHLORHEXIDINE GLUCONATE 4 % EX LIQD: 60.0000 mL | Freq: Once | CUTANEOUS | Status: DC

## 2013-10-07 MED ORDER — CEFAZOLIN SODIUM-DEXTROSE 2-3 GM-% IV SOLR
2.0000 g | INTRAVENOUS | Status: DC
  Filled 2013-10-07: qty 50

## 2013-10-07 MED ORDER — SODIUM CHLORIDE 0.9 % IJ SOLN: 10.0000 mL | INTRAMUSCULAR | Status: DC | PRN

## 2013-10-07 MED ORDER — AMIODARONE HCL 200 MG PO TABS
200.0000 mg | ORAL_TABLET | Freq: Two times a day (BID) | ORAL | Status: DC
  Administered 2013-10-07 – 2013-10-13 (×13): 200 mg via ORAL
  Filled 2013-10-07 (×14): qty 1

## 2013-10-07 MED ORDER — CHLORHEXIDINE GLUCONATE 4 % EX LIQD
60.0000 mL | Freq: Once | CUTANEOUS | Status: AC
  Administered 2013-10-07: 4 via TOPICAL
  Filled 2013-10-07: qty 60

## 2013-10-07 MED ORDER — POTASSIUM CHLORIDE CRYS ER 20 MEQ PO TBCR
40.0000 meq | EXTENDED_RELEASE_TABLET | Freq: Once | ORAL | Status: AC
  Administered 2013-10-07: 40 meq via ORAL

## 2013-10-07 NOTE — Consult Note (Signed)
ELECTROPHYSIOLOGY CONSULT NOTE    Patient ID: TERRI KODAMA MRN: [id number], DOB/AGE: Aug 14, 1928 80 y.o.  Admit date: 10/06/2013 Date of Consult: 10-07-2013  Primary Physician: No PCP Per Patient Primary Cardiologist: Nah[place] Heart Failure: Aundra Dubin Electrophysiologist: Lovena Le  Reason for Consultation: ? CRTP upgrade  HPI:  KENGO BOYAJIAN is a 80 y.o. male with a past medical history significant for CAD, ICM, PAF, and heart failure.  He has had increasing problems with PAF resulting in increased heart failure symptoms.  He was started on amiodarone but was unable to tolerate this due to GI side effects.  He was recently seen by HF clinic and he was in [place].  He was admitted yesterday with increasing shortness of breath and fatigue.  He is RV pacing 85% of the time.  EP has been asked to consult to consider upgrade to [place].  Echo 07-2013 demonstrated EF 20-25%, akinesis of inferolateral, inferior, and inferoseptal myocardium.  He denies chest pain, palpitations, or frank syncope.  He does state that he has dyspnea on exertion, LE edema.  No recent fevers, chills, nausea or vomiting.  ROS is otherwise negative.   Past Medical History  Diagnosis Date  . Coronary artery disease     a. s/p remote CABG x 2(VG->OM, LIMA->LAD;  b. Late 90's s/p PCI of RCA;  c. 12/09 Cath/PCI: LM 33m, 70-80d (3.0x35mm Xience DES), LAD100p, LCX 80-90p (2.25x48mmTaxus Atom DES), RCA 100d (2.5x56mm Xience DES), VG->OM 100, LIMA->LAD nl, EF 30-35%.  . Hyperlipidemia   . Prostate cancer   . PAF (paroxysmal atrial fibrillation)   . Ischemic cardiomyopathy     a. EF 35-40% by echo 05/2012  . Symptomatic bradycardia     a. 08/2003 s/p MDT Enpulse [id number] Dual chamber PPM ser # [id number] H.  . Systolic CHF   . Atrial fibrillation   . CHF (congestive heart failure)   . Peripheral vascular disease      Surgical History:  Past Surgical History  Procedure Laterality Date  . Insert / replace / remove pacemaker    .  Doppler echocardiography  06/24/2002    EF 60-65%  . Tee without cardioversion  06/26/2012    Procedure: TRANSESOPHAGEAL ECHOCARDIOGRAM (TEE);  Surgeon: Lelon Perla, MD;  Location: [facility] ENDOSCOPY;  Service: Cardiovascular;  Laterality: [name];  . Cardioversion  06/26/2012    Procedure: CARDIOVERSION;  Surgeon: Lelon Perla, MD;  Location: [place];  Service: Cardiovascular;  Laterality: [name];  . Appendectomy    . Angioplasty      stent placement  . Embolectomy Right 07/28/2013    Procedure: Right Popliteal and Tibial Embolectomy;  Surgeon: Angelia Mould, MD;  Location: [facility] OR;  Service: Vascular;  Laterality: Right;  . Coronary artery bypass graft  1990     Prescriptions prior to admission  Medication Sig Dispense Refill  . amiodarone (PACERONE) 200 MG tablet Take 1 tablet (200 mg total) by mouth daily.  30 tablet  3  . apixaban (ELIQUIS) 2.5 MG TABS tablet Take 1 tablet (2.5 mg total) by mouth 2 (two) times daily.  60 tablet  2  . Ascorbic Acid (VITAMIN C PO) Take 1 tablet by mouth daily.       Marland Kitchen CALCIUM PO Take 1 tablet by mouth daily.       . carvedilol (COREG) 6.25 MG tablet Take 1 tablet (6.25 mg total) by mouth 2 (two) times daily with a meal.  60 tablet  0  . Cholecalciferol (VITAMIN D PO) Take 1  capsule by mouth daily.       . Coenzyme Q10 (COQ10 PO) Take 1-3 tablets by mouth See admin instructions. Take 3 tablets by mouth in the morning, and 1 tablet by mouth in the evening.      . Omega-3 Fatty Acids (OMEGA-3 FISH OIL PO) Take 30 mLs by mouth daily.      . potassium chloride SA (K-DUR,KLOR-CON) 20 MEQ tablet Take 20 mEq by mouth daily.      Marland Kitchen torsemide (DEMADEX) 20 MG tablet Take 60 mg (3 tabs) in AM and 40 mg (2 tabs) in PM  150 tablet  3  . VITAMIN E PO Take 1 capsule by mouth daily.         Inpatient Medications:  . amiodarone  200 mg Oral BID  . apixaban  2.5 mg Oral BID  . aspirin EC  81 mg Oral Daily  . furosemide  60 mg Intravenous 3 times per day  .  potassium chloride  20 mEq Oral BID  . potassium chloride  40 mEq Oral Once  . sodium chloride  3 mL Intravenous Q12H    Allergies: No Known Allergies  History   Social History  . Marital Status: Married    Spouse Name: [name]    Number of Children: 0  . Years of Education: [name]   Occupational History  . retired    Social History Main Topics  . Smoking status: Former Smoker    Quit date: 02/10/1971  . Smokeless tobacco: Never Used  . Alcohol Use: No  . Drug Use: No  . Sexual Activity: No   Other Topics Concern  . Not on file   Social History Narrative   Lives in [place] with wife.  Retired.     Family History  Problem Relation Age of Onset  . Hypertension Mother   . Colon cancer Mother   . Cancer Mother     Colon  . Alcohol abuse Father       Physical Exam Well appearing elderly man, NAD HEENT: Unremarkable,[place], AT Neck:  6 JVD, no thyromegally Back:  No CVA tenderness Lungs:  Scattered rales, no wheezes HEART:  IRegular rate rhythm, no murmurs, no rubs, no clicks Abd:  soft, positive bowel sounds, no organomegally, no rebound, no guarding Ext:  2 plus pulses, no edema, no cyanosis, no clubbing Skin:  No rashes no nodules Neuro:  CN II through XII intact, motor grossly intact   Labs:   Lab Results  Component Value Date   WBC 4.3 10/06/2013   HGB 11.0* 10/06/2013   HCT 34.6* 10/06/2013   MCV 82.2 10/06/2013   PLT 165 10/06/2013    Recent Labs Lab 10/06/13 1548 10/07/13 0628  NA 146 143  K 3.6* 3.5*  CL 103 105  CO2 28 28  BUN 46* 43*  CREATININE 2.02*  2.01* 1.76*  CALCIUM 9.4 8.8  PROT 7.6  --   BILITOT 1.1  --   ALKPHOS 53  --   ALT 17  --   AST 23  --   GLUCOSE 84 95    Radiology/Studies: Dg Chest Port 1 View 10/07/2013   CLINICAL DATA:  Dyspnea  EXAM: PORTABLE CHEST - 1 VIEW  COMPARISON:  07/25/2013  FINDINGS: Cardiac shadow is enlarged. A pacing device is again noted. The lungs are well aerated. Mild interstitial changes are noted which may  be related to some early interstitial edema. No focal confluent infiltrate is seen. Postsurgical changes are noted.  IMPRESSION: Mild interstitial changes are noted slightly worse on the right than the left.   Electronically Signed   By: Inez Catalina M.D.   On: 10/07/2013 09:05    EKG:AV pacing, QRS 257msec, AV delay 259msec  TELEMETRY: AV pacing  DEVICE HISTORY: dual chamber pacemaker implanted 2005; gen change 08-2012 (MDT Sherril Croon)  A/P 1. CHB 2. Acute on chronic systolic heart failure, EF 20% 3. PAF 4. Class 3 CHF Rec: I have discussed the treatment options with the patient and have recommended insertion of a BiV PM to assist in the treatment of his CHF. He has a DDD system in place currently.   Mikle Bosworth.D.

## 2013-10-07 NOTE — Telephone Encounter (Signed)
Pt admitted to hospital from CHF clinic for further evaluation Cpt [phone] icd 9 code-428.0 With pts current insurance PRIMARY Medicare A and B- no pre cert req'd            SECONDARY [employer]- no pre cert req'd per ref # [id number]

## 2013-10-07 NOTE — Significant Event (Signed)
Report called to [place] ,Pt transferred via bed with belongings to [place]. VSS A&OX4 no distress or c/o noted.

## 2013-10-07 NOTE — Progress Notes (Deleted)
CARDIAC REHAB PHASE I   PRE:  Rate/Rhythm: 81 SR  BP:  Supine: 126/63  Sitting:   Standing:    SaO2: 992L  MODE:  Ambulation: 168 ft   POST:  Rate/Rhythm: 86 SR  BP:  Supine:   Sitting: 101/44  Standing:    SaO2: 85RA  952L 1415-1500 Assisted X 1 and used walker to ambulate. Gait steady with walker. Pt is weak and tires with walking. Pt to recline rafter walk with call light in reach. Room air sat after walk 85%. O2 replaced 2L sat increased to 95%. Pt states that he felt SOB with walking. He states that he can not believe how weak he feels. He was able to walk 168 feet.  Rodney Langton RN 10/07/2013 3:33 PM

## 2013-10-07 NOTE — Progress Notes (Signed)
Peripherally Inserted Central Catheter/Midline Placement  The IV Nurse has discussed with the patient and/or persons authorized to consent for the patient, the purpose of this procedure and the potential benefits and risks involved with this procedure.  The benefits include less needle sticks, lab draws from the catheter and patient may be discharged home with the catheter.  Risks include, but not limited to, infection, bleeding, blood clot (thrombus formation), and puncture of an artery; nerve damage and irregular heat beat.  Alternatives to this procedure were also discussed.  PICC/Midline Placement Documentation        Darlyn Read 10/07/2013, 9:44 AM

## 2013-10-07 NOTE — Progress Notes (Signed)
Advanced Heart Failure Rounding Note   Subjective:    Admitted from HF clinic with volume overload. He was started on Milrinone 0.25 mcg and IV Lasix 60 mg tid. BP soft this am. I/O not accurate. Weight up 1 pound.   Denies SOB/Orthopnea/CP .    Creatinine 2.0>1.7  Objective:   Weight Range:  Vital Signs:   Temp:  [97 F (36.1 C)-98.2 F (36.8 C)] 98.2 F (36.8 C) (05/20 0506) Pulse Rate:  [60-76] 60 (05/20 0704) Resp:  [18-20] 19 (05/20 0506) BP: (92-119)/(47-63) 95/47 mmHg (05/20 0704) SpO2:  [98 %-100 %] 99 % (05/20 0506) Weight:  [163 lb 4.8 oz (74.072 kg)-167 lb 8 oz (75.978 kg)] 164 lb (74.39 kg) (05/20 0506) Last BM Date: 10/05/13  Weight change: Filed Weights   10/06/13 1528 10/07/13 0506  Weight: 163 lb 4.8 oz (74.072 kg) 164 lb (74.39 kg)    Intake/Output:   Intake/Output Summary (Last 24 hours) at 10/07/13 0713 Last data filed at 10/07/13 0624  Gross per 24 hour  Intake 481.24 ml  Output   1650 ml  Net -1168.76 ml     Physical Exam: General:  Well appearing. No resp difficulty HEENT: normal Neck: supple. JVP to ear. Carotids 2+ bilat; no bruits. No lymphadenopathy or thryomegaly appreciated. Cor: PMI nondisplaced. Regular rate & rhythm. No rubs, gallops or murmurs. Lungs: clear Abdomen: soft, nontender, nondistended. No hepatosplenomegaly. No bruits or masses. Good bowel sounds. Extremities: no cyanosis, clubbing, rash, R and LLE 1+edema Neuro: alert & orientedx3, cranial nerves grossly intact. moves all 4 extremities w/o difficulty. Affect pleasant  Telemetry: AV paced 61 bpm   Labs: Basic Metabolic Panel:  Recent Labs Lab 09/30/13 1158 10/06/13 1548  NA 142 146  K 3.8 3.6*  CL 100 103  CO2 27 28  GLUCOSE 116* 84  BUN 45* 46*  CREATININE 1.86* 2.02*  2.01*  CALCIUM 9.6 9.4  MG  --  2.5    Liver Function Tests:  Recent Labs Lab 10/06/13 1548  AST 23  ALT 17  ALKPHOS 53  BILITOT 1.1  PROT 7.6  ALBUMIN 3.6   No results  found for this basename: LIPASE, AMYLASE,  in the last 168 hours No results found for this basename: AMMONIA,  in the last 168 hours  CBC:  Recent Labs Lab 10/06/13 1548  WBC 4.3  HGB 11.0*  HCT 34.6*  MCV 82.2  PLT 165    Cardiac Enzymes: No results found for this basename: CKTOTAL, CKMB, CKMBINDEX, TROPONINI,  in the last 168 hours  BNP: BNP (last 3 results)  Recent Labs  08/31/13 1159 09/30/13 1158 10/06/13 1548  PROBNP 1214.0* 4296.0* 6552.0*     Other results:  EKG:   Imaging:  No results found.   Medications:     Scheduled Medications: . amiodarone  200 mg Oral Daily  . apixaban  2.5 mg Oral BID  . aspirin EC  81 mg Oral Daily  . carvedilol  3.125 mg Oral BID WC  . furosemide  60 mg Intravenous 3 times per day  . potassium chloride  20 mEq Oral BID  . sodium chloride  3 mL Intravenous Q12H     Infusions: . milrinone 0.25 mcg/kg/min (10/07/13 0707)     PRN Medications:  sodium chloride, acetaminophen, ondansetron (ZOFRAN) IV, sodium chloride   Assessment/Plan    1. Acute on chronic systolic CHF: Ischemic cardiomyopathy, EF 20-25%. NYHA class III symptoms, has failed medical management at home. Volume status elevated but  he is starting to diurese on milrinone and IV Lasix. BP soft will need to watch closely.  - Continue lasix 60 mg IV tid and milrinone 0.25 mcg/kg/min. Supplement potassium  - For PICC today to allow co-ox and CVP (to step-down).  - Hold Coreg for now with soft BP. No ACEI/spironolactone for now with elevated creatinine.  - EP consult pending for CRT-D.  He is RV pacing most of the time.   2. Atrial fibrillation: Paroxysmal, currently pacing his atrium. Continue amiodarone 200 mg daily (since he has tolerated this time, will increase to 200 mg bid to help with loading). EP to evaluate today .  If he does not tolerate or it is not successful at NSR maintenance, would consider AV nodal ablation. Given elevated creatinine and age  > 34, needs to be on Eliquis with dose 2.5 mg bid.  3. CAD: Stable. As he is on Eliquis with stable CAD, could consider stopping ASA. He is currently not on a statin.  4. Sick sinus syndrome with Medtronic PCM: He is RV pacing most of the time.  I would like him to have CRT.  Will have EP see him today.  5. CKD: Suspect component of cardiorenal syndrome.  Creatinine lower today on milrinone.   Transfer to [facility] for CVPs. Consult cardiac rehab.   Length of Stay: 1   Amy D Clegg NP-C  10/07/2013, 7:13 AM  Advanced Heart Failure Team Pager [phone] (M-F; 7a - 4p)  Please contact [facility] for night-coverage after hours (4p -7a ) and weekends on [email or web address]  Patient seen with NP, agree with the above note.  Continue IV milrinone gtt and Lasix.  I would like EP to see regarding upgrade soon to CRT as he is RV pacing most of the time.  He is not in atrial fibrillation.  Has tolerated a couple of weeks of amiodarone 200 mg daily, will increase to bid for more efficient load.  PICC for co-ox/CVPs.  Will plan RHC after diuresis and possible CRT.    [name] 10/07/2013 7:41 AM

## 2013-10-07 NOTE — Progress Notes (Signed)
  Echocardiogram 2D Echocardiogram has been performed.  [place] 10/07/2013, 5:40 PM

## 2013-10-07 NOTE — Progress Notes (Signed)
CARDIAC REHAB PHASE I   PRE:  Rate/Rhythm: 67 Pacing  BP:  Supine: 100/70  Sitting:   Standing:    SaO2: 98 RA  MODE:  Ambulation: 460 ft   POST:  Rate/Rhythm: 93  BP:  Supine:   Sitting: 100/70  Standing:    SaO2: 100 RA 1045-1115 Pt tolerated ambulation with assist X 1 to push IV pole. He has fast steady pace. Pt able to walk 460 feet, c/o at end of walk of being tired and SOB. VS stable. Pt to recliner after walk with call light in reach.  Rodney Langton RN 10/07/2013 11:19 AM

## 2013-10-07 NOTE — Progress Notes (Signed)
Nutrition Education Note  RD consulted for nutrition/diet education.  RD provided "Low Sodium Nutrition Therapy" handout from the Academy of Nutrition and Dietetics. Reviewed patient's dietary recall. Provided examples on ways to decrease sodium intake in diet. Discouraged intake of processed foods and use of salt shaker. Encouraged fresh fruits and vegetables as well as whole grain sources of carbohydrates to maximize fiber intake.   Teach back method used. Pt able to identify a few foods in his diet that he plans to limit/avoid such as bacon, cake, and deli meats. Expect good compliance.  Body mass index is 24.94 kg/(m^2). Pt meets criteria for Normal Weight based on current BMI.  Current diet order is 2 Gram Sodium, patient is consuming approximately 100% of meals at this time. Labs and medications reviewed. No further nutrition interventions warranted at this time. RD contact information provided. If additional nutrition issues arise, please re-consult RD.   Pryor Ochoa RD, LDN Inpatient Clinical Dietitian Pager: [phone] After Hours Pager: [phone]

## 2013-10-07 NOTE — Progress Notes (Signed)
Pt. BP low. On call MD made aware. RN will continue to monitor pt. For changes in condition. Gillis Santa Tyechia Allmendinger

## 2013-10-08 ENCOUNTER — Encounter (HOSPITAL_COMMUNITY)
Admission: AD | Disposition: A | Discharge status: Home Health | Payer: Self-pay | Source: Ambulatory Visit | Attending: Cardiology

## 2013-10-08 ENCOUNTER — Other Ambulatory Visit: Payer: Medicare Other

## 2013-10-08 DIAGNOSIS — I447 Left bundle-branch block, unspecified: Secondary | ICD-10-CM | POA: Diagnosis not present

## 2013-10-08 DIAGNOSIS — I5022 Chronic systolic (congestive) heart failure: Secondary | ICD-10-CM | POA: Diagnosis not present

## 2013-10-08 HISTORY — PX: BI-VENTRICULAR PACEMAKER UPGRADE: SHX5464

## 2013-10-08 LAB — CARBOXYHEMOGLOBIN
Carboxyhemoglobin: 2.1 % — ABNORMAL HIGH (ref 0.5–1.5)
Carboxyhemoglobin: 2.1 % — ABNORMAL HIGH (ref 0.5–1.5)
METHEMOGLOBIN: 0.6 % (ref 0.0–1.5)
Methemoglobin: 0.8 % (ref 0.0–1.5)
O2 SAT: 61 %
O2 Saturation: 87.7 %
TOTAL HEMOGLOBIN: 8.6 g/dL — AB (ref 13.5–18.0)
Total hemoglobin: 10.8 g/dL — ABNORMAL LOW (ref 13.5–18.0)

## 2013-10-08 LAB — CBC
HEMATOCRIT: 31.9 % — AB (ref 39.0–52.0)
HEMOGLOBIN: 10 g/dL — AB (ref 13.0–17.0)
MCH: 25.8 pg — ABNORMAL LOW (ref 26.0–34.0)
MCHC: 31.3 g/dL (ref 30.0–36.0)
MCV: 82.4 fL (ref 78.0–100.0)
Platelets: 148 10*3/uL — ABNORMAL LOW (ref 150–400)
RBC: 3.87 MIL/uL — ABNORMAL LOW (ref 4.22–5.81)
RDW: 17.7 % — ABNORMAL HIGH (ref 11.5–15.5)
WBC: 5.5 10*3/uL (ref 4.0–10.5)

## 2013-10-08 LAB — BASIC METABOLIC PANEL
BUN: 35 mg/dL — ABNORMAL HIGH (ref 6–23)
CALCIUM: 9 mg/dL (ref 8.4–10.5)
CO2: 26 meq/L (ref 19–32)
CREATININE: 1.55 mg/dL — AB (ref 0.50–1.35)
Chloride: 104 mEq/L (ref 96–112)
GFR, EST AFRICAN AMERICAN: 46 mL/min — AB (ref >90)
GFR, EST NON AFRICAN AMERICAN: 40 mL/min — AB (ref >90)
Glucose, Bld: 133 mg/dL — ABNORMAL HIGH (ref 70–99)
Potassium: 3.2 mEq/L — ABNORMAL LOW (ref 3.7–5.3)
SODIUM: 143 meq/L (ref 137–147)

## 2013-10-08 SURGERY — BI-VENTRICULAR PACEMAKER UPGRADE
Anesthesia: LOCAL

## 2013-10-08 MED ORDER — AMIODARONE HCL 200 MG PO TABS: 200.0000 mg | ORAL_TABLET | Freq: Every day | ORAL | Status: DC

## 2013-10-08 MED ORDER — MIDAZOLAM HCL 5 MG/5ML IJ SOLN
INTRAMUSCULAR | Status: AC
  Filled 2013-10-08: qty 5

## 2013-10-08 MED ORDER — LIDOCAINE HCL (PF) 1 % IJ SOLN
INTRAMUSCULAR | Status: AC
  Filled 2013-10-08: qty 30

## 2013-10-08 MED ORDER — POTASSIUM CHLORIDE CRYS ER 20 MEQ PO TBCR
40.0000 meq | EXTENDED_RELEASE_TABLET | Freq: Once | ORAL | Status: AC
  Administered 2013-10-08: 40 meq via ORAL
  Filled 2013-10-08: qty 2

## 2013-10-08 MED ORDER — SODIUM CHLORIDE 0.9 % IR SOLN
Freq: Once | Status: DC
  Filled 2013-10-08: qty 2

## 2013-10-08 MED ORDER — FUROSEMIDE 10 MG/ML IJ SOLN
80.0000 mg | Freq: Three times a day (TID) | INTRAMUSCULAR | Status: DC
  Administered 2013-10-08 – 2013-10-10 (×6): 80 mg via INTRAVENOUS
  Filled 2013-10-08 (×9): qty 8

## 2013-10-08 MED ORDER — CARVEDILOL 6.25 MG PO TABS
6.2500 mg | ORAL_TABLET | Freq: Two times a day (BID) | ORAL | Status: DC
  Filled 2013-10-08 (×3): qty 1

## 2013-10-08 MED ORDER — FENTANYL CITRATE 0.05 MG/ML IJ SOLN
INTRAMUSCULAR | Status: AC
  Filled 2013-10-08: qty 2

## 2013-10-08 MED ORDER — POTASSIUM CHLORIDE CRYS ER 20 MEQ PO TBCR: 20.0000 meq | EXTENDED_RELEASE_TABLET | Freq: Every day | ORAL | Status: DC

## 2013-10-08 MED ORDER — ACETAMINOPHEN 325 MG PO TABS: 325.0000 mg | ORAL_TABLET | ORAL | Status: DC | PRN

## 2013-10-08 MED ORDER — CEFAZOLIN SODIUM-DEXTROSE 2-3 GM-% IV SOLR
INTRAVENOUS | Status: AC
  Filled 2013-10-08: qty 50

## 2013-10-08 MED ORDER — CEFAZOLIN SODIUM-DEXTROSE 2-3 GM-% IV SOLR
2.0000 g | Freq: Four times a day (QID) | INTRAVENOUS | Status: AC
  Administered 2013-10-09 (×3): 2 g via INTRAVENOUS
  Filled 2013-10-08 (×3): qty 50

## 2013-10-08 MED ORDER — HEPARIN (PORCINE) IN NACL 2-0.9 UNIT/ML-% IJ SOLN
INTRAMUSCULAR | Status: AC
  Filled 2013-10-08: qty 500

## 2013-10-08 MED ORDER — ONDANSETRON HCL 4 MG/2ML IJ SOLN: 4.0000 mg | Freq: Four times a day (QID) | INTRAMUSCULAR | Status: DC | PRN

## 2013-10-08 NOTE — Interval H&P Note (Signed)
History and Physical Interval Note:  10/08/2013 4:53 PM  Marcus [name]  has presented today for surgery, with the diagnosis of heart failure  The various methods of treatment have been discussed with the patient and family. After consideration of risks, benefits and other options for treatment, the patient has consented to  Procedure(s): BI-VENTRICULAR PACEMAKER UPGRADE ([name]) as a surgical intervention .  The patient's history has been reviewed, patient examined, no change in status, stable for surgery.  I have reviewed the patient's chart and labs.  Questions were answered to the patient's satisfaction.     [name]

## 2013-10-08 NOTE — CV Procedure (Signed)
Electrophysiology procedure note  Procedure: Removal of a dual-chamber pacemaker and insertion of a left ventricular pacing lead, a biventricular pacemaker, utilizing coronary sinus and right upper extremity venography, and pacemaker pocket revision.  Indication: Chronic systolic heart failure, pacing induced left bundle branch block, QRS duration 180 ms, class III congestive heart failure, ejection fraction 20%  Description of the procedure: After informed consent was obtained, the patient was taken to the diagnostic electrophysiology laboratory in the fasting state. After the usual preparation and draping, 10 cc of contrast was injected into the right upper extremity venous system demonstrating that the right subclavian vein was patent. Previous attempts to puncture the vein were unsuccessful. Medtronic straight  guiding catheter was advanced along with a 6 [place] hexapolar EP catheter into the right atrium. The coronary sinus was cannulated and the guiding catheter was inserted over the EP catheter into the coronary sinus. Initial venography of the coronary sinus demonstrated a valve at 4:00 in the LAO projection. Attempt to traverse the valve resulted in the angioplasty guidewire finding the vein of [place]. The angioplasty guidewire was redirected and ultimately the guiding catheter was able to traverse the coronary sinus valve. Venography of the coronary sinus was repeated. This demonstrated a satisfactory lateral vein. The angioplasty guidewire along with a Medtronic model 4194 bipolar left ventricular pacing lead, serial number [id number] was advanced into the lateral vein approximately 2/3 from base to apex. In this location, there was no diaphragmatic stimulation. The patient threshold was 1 V at 0.5 ms. Pacing impedance was 900 ohms, and the R wave measured 12 mV. 10 V pacing did not stimulate the diaphragm. With the satisfactory parameters, the guiding catheter was liberated from the newly  implanted left ventricular lead in the usual manner. The lead was secured to the fascial plane with silk suture. The sewing sleeve was secured with silk suture. Electrocautery was then utilized to enter the previously implanted pacemaker pocket and the previously implanted dual-chamber pacemaker was removed with gentle traction. At this point the pacemaker pocket was revised to accommodate the larger biventricular pacemaker. The pocket was irrigated with antibiotic irrigation. The new Medtronic biventricular pacemaker, serial number [id number] was connected to the old atrial and RV lead along with a new left ventricular lead and placed in the subcutaneous pocket. The pocket was irrigated with additional antibiotic irrigation, and the incision was closed with 2 layers of Vicryl suture. Benzoin and Steri-Strips for pain on the skin, a pressure dressing was applied, and the patient was returned to his room in satisfactory condition.  Complications: There were no immediate complications  Conclusion: This study demonstrates successful upgrade of a dual-chamber pacemaker to a biventricular pacemaker in a patient with complete heart block, Pacing induced left bundle branch block, QRS duration 180 ms, and chronic systolic heart failure, class III, ejection fraction 20%.  Cristopher Peru, M.D.

## 2013-10-08 NOTE — H&P (View-Only) (Signed)
ELECTROPHYSIOLOGY CONSULT NOTE    Patient ID: KOUA GROWNEY MRN: [id number], DOB/AGE: 30-Mar-1929 80 y.o.  Admit date: 10/06/2013 Date of Consult: 10-07-2013  Primary Physician: No PCP Per Patient Primary Cardiologist: Nahser Heart Failure: Aundra Dubin Electrophysiologist: Lovena Le  Reason for Consultation: ? CRTP upgrade  HPI:  Marcus [name] is a 80 y.o. male with a past medical history significant for CAD, ICM, PAF, and heart failure.  He has had increasing problems with PAF resulting in increased heart failure symptoms.  He was started on amiodarone but was unable to tolerate this due to GI side effects.  He was recently seen by HF clinic and he was in [place].  He was admitted yesterday with increasing shortness of breath and fatigue.  He is RV pacing 85% of the time.  EP has been asked to consult to consider upgrade to [place].  Echo 07-2013 demonstrated EF 20-25%, akinesis of inferolateral, inferior, and inferoseptal myocardium.  He denies chest pain, palpitations, or frank syncope.  He does state that he has dyspnea on exertion, LE edema.  No recent fevers, chills, nausea or vomiting.  ROS is otherwise negative.   Past Medical History  Diagnosis Date  . Coronary artery disease     a. s/p remote CABG x 2(VG->OM, LIMA->LAD;  b. Late 90's s/p PCI of RCA;  c. 12/09 Cath/PCI: LM 75m, 70-80d (3.0x58mm Xience DES), LAD100p, LCX 80-90p (2.25x42mmTaxus Atom DES), RCA 100d (2.5x7mm Xience DES), VG->OM 100, LIMA->LAD nl, EF 30-35%.  . Hyperlipidemia   . Prostate cancer   . PAF (paroxysmal atrial fibrillation)   . Ischemic cardiomyopathy     a. EF 35-40% by echo 05/2012  . Symptomatic bradycardia     a. 08/2003 s/p MDT Enpulse [id number] Dual chamber PPM ser # [id number] H.  . Systolic CHF   . Atrial fibrillation   . CHF (congestive heart failure)   . Peripheral vascular disease      Surgical History:  Past Surgical History  Procedure Laterality Date  . Insert / replace / remove pacemaker    .  Doppler echocardiography  06/24/2002    EF 60-65%  . Tee without cardioversion  06/26/2012    Procedure: TRANSESOPHAGEAL ECHOCARDIOGRAM (TEE);  Surgeon: Lelon Perla, MD;  Location: [facility] ENDOSCOPY;  Service: Cardiovascular;  Laterality: [name];  . Cardioversion  06/26/2012    Procedure: CARDIOVERSION;  Surgeon: Lelon Perla, MD;  Location: [facility];  Service: Cardiovascular;  Laterality: [name];  . Appendectomy    . Angioplasty      stent placement  . Embolectomy Right 07/28/2013    Procedure: Right Popliteal and Tibial Embolectomy;  Surgeon: Angelia Mould, MD;  Location: [facility] OR;  Service: Vascular;  Laterality: Right;  . Coronary artery bypass graft  1990     Prescriptions prior to admission  Medication Sig Dispense Refill  . amiodarone (PACERONE) 200 MG tablet Take 1 tablet (200 mg total) by mouth daily.  30 tablet  3  . apixaban (ELIQUIS) 2.5 MG TABS tablet Take 1 tablet (2.5 mg total) by mouth 2 (two) times daily.  60 tablet  2  . Ascorbic Acid (VITAMIN C PO) Take 1 tablet by mouth daily.       Marland Kitchen CALCIUM PO Take 1 tablet by mouth daily.       . carvedilol (COREG) 6.25 MG tablet Take 1 tablet (6.25 mg total) by mouth 2 (two) times daily with a meal.  60 tablet  0  . Cholecalciferol (VITAMIN D PO) Take 1  capsule by mouth daily.       . Coenzyme Q10 (COQ10 PO) Take 1-3 tablets by mouth See admin instructions. Take 3 tablets by mouth in the morning, and 1 tablet by mouth in the evening.      . Omega-3 Fatty Acids (OMEGA-3 FISH OIL PO) Take 30 mLs by mouth daily.      . potassium chloride SA (K-DUR,KLOR-CON) 20 MEQ tablet Take 20 mEq by mouth daily.      Marland Kitchen torsemide (DEMADEX) 20 MG tablet Take 60 mg (3 tabs) in AM and 40 mg (2 tabs) in PM  150 tablet  3  . VITAMIN E PO Take 1 capsule by mouth daily.         Inpatient Medications:  . amiodarone  200 mg Oral BID  . apixaban  2.5 mg Oral BID  . aspirin EC  81 mg Oral Daily  . furosemide  60 mg Intravenous 3 times per day  .  potassium chloride  20 mEq Oral BID  . potassium chloride  40 mEq Oral Once  . sodium chloride  3 mL Intravenous Q12H    Allergies: No Known Allergies  History   Social History  . Marital Status: Married    Spouse Name: [name]    Number of Children: 0  . Years of Education: [name]   Occupational History  . retired    Social History Main Topics  . Smoking status: Former Smoker    Quit date: 02/10/1971  . Smokeless tobacco: Never Used  . Alcohol Use: No  . Drug Use: No  . Sexual Activity: No   Other Topics Concern  . Not on file   Social History Narrative   Lives in [place] with wife.  Retired.     Family History  Problem Relation Age of Onset  . Hypertension Mother   . Colon cancer Mother   . Cancer Mother     Colon  . Alcohol abuse Father       Physical Exam Well appearing elderly man, NAD HEENT: Unremarkable,[place], AT Neck:  6 JVD, no thyromegally Back:  No CVA tenderness Lungs:  Scattered rales, no wheezes HEART:  IRegular rate rhythm, no murmurs, no rubs, no clicks Abd:  soft, positive bowel sounds, no organomegally, no rebound, no guarding Ext:  2 plus pulses, no edema, no cyanosis, no clubbing Skin:  No rashes no nodules Neuro:  CN II through XII intact, motor grossly intact   Labs:   Lab Results  Component Value Date   WBC 4.3 10/06/2013   HGB 11.0* 10/06/2013   HCT 34.6* 10/06/2013   MCV 82.2 10/06/2013   PLT 165 10/06/2013    Recent Labs Lab 10/06/13 1548 10/07/13 0628  NA 146 143  K 3.6* 3.5*  CL 103 105  CO2 28 28  BUN 46* 43*  CREATININE 2.02*  2.01* 1.76*  CALCIUM 9.4 8.8  PROT 7.6  --   BILITOT 1.1  --   ALKPHOS 53  --   ALT 17  --   AST 23  --   GLUCOSE 84 95    Radiology/Studies: Dg Chest Port 1 View 10/07/2013   CLINICAL DATA:  Dyspnea  EXAM: PORTABLE CHEST - 1 VIEW  COMPARISON:  07/25/2013  FINDINGS: Cardiac shadow is enlarged. A pacing device is again noted. The lungs are well aerated. Mild interstitial changes are noted which may  be related to some early interstitial edema. No focal confluent infiltrate is seen. Postsurgical changes are noted.  IMPRESSION: Mild interstitial changes are noted slightly worse on the right than the left.   Electronically Signed   By: Inez Catalina M.D.   On: 10/07/2013 09:05    EKG:AV pacing, QRS 243msec, AV delay 237msec  TELEMETRY: AV pacing  DEVICE HISTORY: dual chamber pacemaker implanted 2005; gen change 08-2012 (MDT Sherril Croon)  A/P 1. CHB 2. Acute on chronic systolic heart failure, EF 20% 3. PAF 4. Class 3 CHF Rec: I have discussed the treatment options with the patient and have recommended insertion of a BiV PM to assist in the treatment of his CHF. He has a DDD system in place currently.   Mikle Bosworth.D.

## 2013-10-08 NOTE — Care Management Note (Addendum)
    Page 1 of 1   10/13/2013     9:36:06 AM CARE MANAGEMENT NOTE 10/13/2013  Patient:  Marcus [name], Marcus [name]   Account Number:  [id number]  Date Initiated:  10/08/2013  Documentation initiated by:  Elissa Hefty  Subjective/Objective Assessment:   adm w heart failure     Action/Plan:   lives w wife   Anticipated DC Date:  10/13/2013   Anticipated DC Plan:  [place]  CM consult      [facility] Choice  HOME HEALTH   Choice offered to / List presented to:  C-1 Patient        [place] arranged  HH-1 RN  [place] agency  [facility]   Status of service:  Completed, signed off Medicare Important Message given?  YES (If response is "NO", the following Medicare IM given date fields will be blank) Date Medicare IM given:  10/13/2013 Date Additional Medicare IM given:    Discharge Disposition:  [place]  Per UR Regulation:  Reviewed for med. necessity/level of care/duration of stay  If discussed at [place] of Stay Meetings, dates discussed:    Comments:  5/26 0926 debbie Jerriann Schrom rn,bsn spoke w pt and fam. wnet over list of hhc agencies. they chose [facility] for hhc. ref to debbie barnes w [facility] for hhrn and hhpt.

## 2013-10-08 NOTE — Progress Notes (Signed)
CARDIAC REHAB PHASE I   PRE:  Rate/Rhythm: 79 Pacing  BP:  Supine:   Sitting: 126/78  Standing:    SaO2: 98 RA  MODE:  Ambulation: 1090 ft   POST:  Rate/Rhythm: 97 Pacing  BP:  Supine:   Sitting: 131/81  Standing:    SaO2: 100 RA 1340-1410 Assisted X 1 to ambulate to push IV pole. Gait steady, fast pace walk. Pt walk 1090 feet without c/o. Back to bed after walk with call light in reach and wife present. I gave pt CHF information and low sodium diet information. We will continue to follow pt for ambulation and education.  Rodney Langton RN 10/08/2013 2:10 PM

## 2013-10-08 NOTE — Progress Notes (Signed)
ELECTROPHYSIOLOGY ROUNDING NOTE    Patient Name: Marcus [name] Date of Encounter: 10/08/2013    SUBJECTIVE:Patient feels like his breathing is improved this morning. No chest pain.   Renal function improved today.  TELEMETRY: Reviewed telemetry pt in AV pacing Filed Vitals:   10/07/13 2353 10/08/13 0355 10/08/13 0500 10/08/13 0817  BP: 104/48 112/68  116/67  Pulse: 65 69    Temp: 97.7 F (36.5 C) 98 F (36.7 C)  98.3 F (36.8 C)  TempSrc: Oral Oral  Oral  Resp:  22  19  Height:      Weight:   161 lb 9.6 oz (73.3 kg)   SpO2: 98% 98%  100%    Intake/Output Summary (Last 24 hours) at 10/08/13 [id number] Last data filed at 10/08/13 0800  Gross per 24 hour  Intake  780.1 ml  Output   1750 ml  Net -969.9 ml    CURRENT MEDICATIONS: . amiodarone  200 mg Oral BID  . apixaban  2.5 mg Oral BID  . aspirin EC  81 mg Oral Daily  .  ceFAZolin (ANCEF) IV  2 g Intravenous On Call  . chlorhexidine  60 mL Topical Once  . furosemide  80 mg Intravenous 3 times per day  . gentamicin irrigation  80 mg Irrigation On Call  . potassium chloride  20 mEq Oral BID  . potassium chloride  40 mEq Oral Once  . potassium chloride  40 mEq Oral Once  . sodium chloride  3 mL Intravenous Q12H    LABS: Basic Metabolic Panel:  Recent Labs  10/06/13 1548 10/07/13 0628 10/08/13 0500  NA 146 143 143  K 3.6* 3.5* 3.2*  CL 103 105 104  CO2 28 28 26   GLUCOSE 84 95 133*  BUN 46* 43* 35*  CREATININE 2.02*  2.01* 1.76* 1.55*  CALCIUM 9.4 8.8 9.0  MG 2.5  --   --    Liver Function Tests:  Recent Labs  10/06/13 1548  AST 23  ALT 17  ALKPHOS 53  BILITOT 1.1  PROT 7.6  ALBUMIN 3.6   CBC:  Recent Labs  10/06/13 1548  WBC 4.3  HGB 11.0*  HCT 34.6*  MCV 82.2  PLT 165   Thyroid Function Tests:  Recent Labs  10/06/13 1548  TSH 2.730    Radiology/Studies:  Dg Chest Port 1 View 10/07/2013   CLINICAL DATA:  Status post PICC line placement  EXAM: PORTABLE CHEST - 1 VIEW   COMPARISON:  DG CHEST 1V PORT dated 10/07/2013  FINDINGS: A PICC line is present via the left upper extremity with the tip in the midportion of the SVC. Increased pulmonary interstitial markings bilaterally. Cardiac silhouette top normal in size. Permanent pacemaker unchanged in position. Previous median sternotomy.  IMPRESSION: 1. No immediate postprocedure complication following placement of a PICC line via the left upper extremity. 2. Mild pulmonary interstitial edema. 3. These results will be called to the ordering clinician or representative by the Radiologist Assistant, and communication documented in the PACS or zVision Dashboard.   Electronically Signed   By: David  Martinique   On: 10/07/2013 10:59   Dg Chest Port 1 View 10/07/2013   CLINICAL DATA:  Dyspnea  EXAM: PORTABLE CHEST - 1 VIEW  COMPARISON:  07/25/2013  FINDINGS: Cardiac shadow is enlarged. A pacing device is again noted. The lungs are well aerated. Mild interstitial changes are noted which may be related to some early interstitial edema. No focal confluent infiltrate is seen.  Postsurgical changes are noted.  IMPRESSION: Mild interstitial changes are noted slightly worse on the right than the left.   Electronically Signed   By: Inez Catalina M.D.   On: 10/07/2013 09:05     PHYSICAL EXAM Well appearing elderly man, NAD HEENT: Unremarkable,[place], AT Neck:  8 JVD, no thyromegally Back:  No CVA tenderness Lungs:  Clear with no wheezes, rales, or rhonchi HEART:  Regular rate rhythm, no murmurs, no rubs, no clicks Abd:  soft, positive bowel sounds, no organomegally, no rebound, no guarding Ext:  2 plus pulses, no edema, no cyanosis, no clubbing Skin:  No rashes no nodules Neuro:  CN II through XII intact, motor grossly intact     Active Problems:   Acute on chronic systolic heart failure  Pacing induced LBBB, QRS duration 180 ms [id number] plan to proceed with BiV PPM upgrade.  Mikle Bosworth.D.

## 2013-10-08 NOTE — Interval H&P Note (Signed)
History and Physical Interval Note:  10/08/2013 3:22 PM  Marcus [name]  has presented today for surgery, with the diagnosis of heart failure  The various methods of treatment have been discussed with the patient and family. After consideration of risks, benefits and other options for treatment, the patient has consented to  Procedure(s): BI-VENTRICULAR PACEMAKER UPGRADE ([name]) as a surgical intervention .  The patient's history has been reviewed, patient examined, no change in status, stable for surgery.  I have reviewed the patient's chart and labs.  Questions were answered to the patient's satisfaction.     [name]

## 2013-10-08 NOTE — Evaluation (Signed)
Physical Therapy Evaluation Patient Details Name: Marcus [name] MRN: [id number] DOB: 05-Nov-1928 Today's Date: 10/08/2013   History of Present Illness  Pt is an 80 y/o male sent to [facility] from the HF Clinic with volume overload. Pt admitted with a diagnosis of heart failure.   Clinical Impression  Patient evaluated by Physical Therapy with no further acute PT needs identified. All education has been completed and the patient has no further questions. At the time of PT eval pt was independent with all mobility. See below for any follow-up Physial Therapy or equipment needs. PT is signing off. Thank you for this referral.     Follow Up Recommendations No PT follow up    Equipment Recommendations  None recommended by PT    Recommendations for Other Services       Precautions / Restrictions Precautions Precautions: Fall Restrictions Weight Bearing Restrictions: No      Mobility  Bed Mobility Overal bed mobility: Independent             General bed mobility comments: No physical assist required.  Transfers Overall transfer level: Independent Equipment used: None                Ambulation/Gait Ambulation/Gait assistance: Independent Ambulation Distance (Feet): 300 Feet Assistive device: None Gait Pattern/deviations: WFL(Within Functional Limits)   Gait velocity interpretation: at or above normal speed for age/gender General Gait Details: No LOB noted, no AD used or required.  Stairs            Wheelchair Mobility    Modified Rankin (Stroke Patients Only)       Balance Overall balance assessment: No apparent balance deficits (not formally assessed)                                           Pertinent Vitals/Pain O2 sats stable on RA and pt reports no SOB during gait training.     Home Living Family/patient expects to be discharged to:: Private residence Living Arrangements: Spouse/significant other Available Help at  Discharge: Family;Available PRN/intermittently Type of Home: House Home Access: Stairs to enter Entrance Stairs-Rails: None Entrance Stairs-Number of Steps: 3 Home Layout: One level Home Equipment: Shower seat - built in Additional Comments: His home has 3 STE but he enters through the garage which only has 1 STE    Prior Function Level of Independence: Independent         Comments: enjoys gardening     Hand Dominance   Dominant Hand: Right    Extremity/Trunk Assessment   Upper Extremity Assessment: Overall WFL for tasks assessed           Lower Extremity Assessment: Overall WFL for tasks assessed      Cervical / Trunk Assessment: Kyphotic;Other exceptions  Communication   Communication: No difficulties  Cognition Arousal/Alertness: Awake/alert Behavior During Therapy: WFL for tasks assessed/performed Overall Cognitive Status: Within Functional Limits for tasks assessed                      General Comments      Exercises        Assessment/Plan    PT Assessment Patent does not need any further PT services  PT Diagnosis     PT Problem List    PT Treatment Interventions     PT Goals (Current goals can be found in the Care Plan  section) Acute Rehab PT Goals PT Goal Formulation: No goals set, d/c therapy    Frequency     Barriers to discharge        Co-evaluation               End of Session Equipment Utilized During Treatment:  (Pt refused use of gait belt for safety.) Activity Tolerance: Patient tolerated treatment well Patient left: in bed;with call bell/phone within reach Nurse Communication: Mobility status         Time: 1203-1220 PT Time Calculation (min): 17 min   Charges:   PT Evaluation $Initial PT Evaluation Tier I: 1 Procedure PT Treatments $Therapeutic Activity: 8-22 mins   PT G CodesJolyn [name] 10/08/2013, 2:02 PM  Marcus [name], PT, DPT Acute Rehabilitation Services Pager:  [phone]

## 2013-10-08 NOTE — Progress Notes (Signed)
Orthopedic Tech Progress Note Patient Details:  Marcus [name] 1929-05-17 [id number]  Ortho Devices Type of Ortho Device: Arm sling Ortho Device/Splint Location: RUE Ortho Device/Splint Interventions: Ordered;Application   Braulio Bosch 10/08/2013, 9:06 PM

## 2013-10-08 NOTE — Progress Notes (Addendum)
Patient ID: Marcus [name], male   DOB: [birth year], 80 y.o.   MRN: [id number] Advanced Heart Failure Rounding Note   Subjective:    Admitted from HF clinic with volume overload. He was started on Milrinone 0.25 mcg and IV Lasix 60 mg tid.   Co-ox 47% last night so milrinone increased to 0.375 mcg/kg/min.  I/O -640 so Lasix increased to 80 mg IV every 8 hours.  Weight is down. CVP remains 15 this morning.   He says he did ok walking yesterday, felt a little better on milrinone.   Creatinine 2.0>1.7>pending Objective:   Weight Range:  Vital Signs:   Temp:  [97.7 F (36.5 C)-98.5 F (36.9 C)] 98 F (36.7 C) (05/21 0355) Pulse Rate:  [59-69] 69 (05/21 0355) Resp:  [10-28] 22 (05/21 0355) BP: (90-122)/(47-78) 112/68 mmHg (05/21 0355) SpO2:  [97 %-100 %] 98 % (05/21 0355) Weight:  [76.6 kg (168 lb 14 oz)] 76.6 kg (168 lb 14 oz) (05/20 1433) Last BM Date: 10/05/13  Weight change: Filed Weights   10/06/13 1528 10/07/13 0506 10/07/13 1433  Weight: 74.072 kg (163 lb 4.8 oz) 74.39 kg (164 lb) 76.6 kg (168 lb 14 oz)    Intake/Output:   Intake/Output Summary (Last 24 hours) at 10/08/13 0653 Last data filed at 10/08/13 0200  Gross per 24 hour  Intake 1434.9 ml  Output   2075 ml  Net -640.1 ml     Physical Exam: General:  Well appearing. No resp difficulty HEENT: normal Neck: supple. JVP to ear. Carotids 2+ bilat; no bruits. No lymphadenopathy or thryomegaly appreciated. Cor: PMI nondisplaced. Regular rate & rhythm. No rubs, gallops or murmurs. Lungs: clear Abdomen: soft, nontender, nondistended. No hepatosplenomegaly. No bruits or masses. Good bowel sounds. Extremities: no cyanosis, clubbing, rash, R and LLE 1+edema 1/3 to knees. Neuro: alert & orientedx3, cranial nerves grossly intact. moves all 4 extremities w/o difficulty. Affect pleasant  Telemetry: AV paced 61 bpm   Labs: Basic Metabolic Panel:  Recent Labs Lab 10/06/13 1548 10/07/13 0628  NA 146 143  K 3.6*  3.5*  CL 103 105  CO2 28 28  GLUCOSE 84 95  BUN 46* 43*  CREATININE 2.02*  2.01* 1.76*  CALCIUM 9.4 8.8  MG 2.5  --     Liver Function Tests:  Recent Labs Lab 10/06/13 1548  AST 23  ALT 17  ALKPHOS 53  BILITOT 1.1  PROT 7.6  ALBUMIN 3.6   No results found for this basename: LIPASE, AMYLASE,  in the last 168 hours No results found for this basename: AMMONIA,  in the last 168 hours  CBC:  Recent Labs Lab 10/06/13 1548  WBC 4.3  HGB 11.0*  HCT 34.6*  MCV 82.2  PLT 165    Cardiac Enzymes: No results found for this basename: CKTOTAL, CKMB, CKMBINDEX, TROPONINI,  in the last 168 hours  BNP: BNP (last 3 results)  Recent Labs  08/31/13 1159 09/30/13 1158 10/06/13 1548  PROBNP 1214.0* 4296.0* 6552.0*     Other results:  EKG:   Imaging: Dg Chest Port 1 View  10/07/2013   CLINICAL DATA:  Status post PICC line placement  EXAM: PORTABLE CHEST - 1 VIEW  COMPARISON:  DG CHEST 1V PORT dated 10/07/2013  FINDINGS: A PICC line is present via the left upper extremity with the tip in the midportion of the SVC. Increased pulmonary interstitial markings bilaterally. Cardiac silhouette top normal in size. Permanent pacemaker unchanged in position. Previous median sternotomy.  IMPRESSION: 1.  No immediate postprocedure complication following placement of a PICC line via the left upper extremity. 2. Mild pulmonary interstitial edema. 3. These results will be called to the ordering clinician or representative by the Radiologist Assistant, and communication documented in the PACS or zVision Dashboard.   Electronically Signed   By: David  Martinique   On: 10/07/2013 10:59   Dg Chest Port 1 View  10/07/2013   CLINICAL DATA:  Dyspnea  EXAM: PORTABLE CHEST - 1 VIEW  COMPARISON:  07/25/2013  FINDINGS: Cardiac shadow is enlarged. A pacing device is again noted. The lungs are well aerated. Mild interstitial changes are noted which may be related to some early interstitial edema. No focal  confluent infiltrate is seen. Postsurgical changes are noted.  IMPRESSION: Mild interstitial changes are noted slightly worse on the right than the left.   Electronically Signed   By: Inez Catalina M.D.   On: 10/07/2013 09:05     Medications:     Scheduled Medications: . amiodarone  200 mg Oral BID  . apixaban  2.5 mg Oral BID  . aspirin EC  81 mg Oral Daily  .  ceFAZolin (ANCEF) IV  2 g Intravenous On Call  . chlorhexidine  60 mL Topical Once  . furosemide  80 mg Intravenous 3 times per day  . gentamicin irrigation  80 mg Irrigation On Call  . potassium chloride  20 mEq Oral BID  . sodium chloride  3 mL Intravenous Q12H    Infusions: . milrinone 0.375 mcg/kg/min (10/08/13 0100)    PRN Medications: sodium chloride, acetaminophen, ondansetron (ZOFRAN) IV, sodium chloride, sodium chloride   Assessment/Plan    1. Acute on chronic systolic CHF: Ischemic cardiomyopathy, EF 20-25%. NYHA class IIIb symptoms, has failed medical management at home. Volume status elevated but he is starting to diurese on milrinone and IV Lasix. He has been RV pacing.  - Lasix increased to 80 mg IV tid and milrinone to 0.375 mcg/kg/min. Awaiting co-ox this morning. - Coreg held yesterday with soft BP. No ACEI/spironolactone for now with elevated creatinine.  - Plan for CRT today.      - He will need eventual RHC after diuresis.  2. Atrial fibrillation: Paroxysmal, currently pacing his atrium. Amiodarone increased to 200 mg bid for loading since he tolerated 200 daily without nausea.  If he does not tolerate amiodarone or it is not successful at NSR maintenance, would consider AV nodal ablation. Given elevated creatinine and age > 23, needs to be on Eliquis with dose 2.5 mg bid.  3. CAD: Stable. As he is on Eliquis with stable CAD, could consider stopping ASA. He is currently not on a statin.  4. Sick sinus syndrome with [employer] PCM: He is RV pacing most of the time.  Plan for CRT today.   5. CKD: Suspect  component of cardiorenal syndrome.  Await today's creatinine.   Length of Stay: [place] 10/08/2013, 6:53 AM  Creatinine improved to 1.5 today and co-ox much better.  Continue current plan.   [name] 10/08/2013

## 2013-10-09 ENCOUNTER — Inpatient Hospital Stay (HOSPITAL_COMMUNITY): Payer: Medicare Other

## 2013-10-09 ENCOUNTER — Encounter (HOSPITAL_COMMUNITY): Payer: Self-pay

## 2013-10-09 DIAGNOSIS — I2510 Atherosclerotic heart disease of native coronary artery without angina pectoris: Secondary | ICD-10-CM | POA: Diagnosis not present

## 2013-10-09 DIAGNOSIS — J9819 Other pulmonary collapse: Secondary | ICD-10-CM | POA: Diagnosis not present

## 2013-10-09 DIAGNOSIS — J811 Chronic pulmonary edema: Secondary | ICD-10-CM | POA: Diagnosis not present

## 2013-10-09 DIAGNOSIS — I442 Atrioventricular block, complete: Secondary | ICD-10-CM | POA: Diagnosis not present

## 2013-10-09 DIAGNOSIS — I509 Heart failure, unspecified: Secondary | ICD-10-CM | POA: Diagnosis not present

## 2013-10-09 DIAGNOSIS — I130 Hypertensive heart and chronic kidney disease with heart failure and stage 1 through stage 4 chronic kidney disease, or unspecified chronic kidney disease: Secondary | ICD-10-CM | POA: Diagnosis not present

## 2013-10-09 DIAGNOSIS — I5023 Acute on chronic systolic (congestive) heart failure: Secondary | ICD-10-CM | POA: Diagnosis not present

## 2013-10-09 LAB — BASIC METABOLIC PANEL
BUN: 28 mg/dL — AB (ref 6–23)
CO2: 26 mEq/L (ref 19–32)
Calcium: 8.6 mg/dL (ref 8.4–10.5)
Chloride: 105 mEq/L (ref 96–112)
Creatinine, Ser: 1.47 mg/dL — ABNORMAL HIGH (ref 0.50–1.35)
GFR calc Af Amer: 49 mL/min — ABNORMAL LOW (ref >90)
GFR calc non Af Amer: 43 mL/min — ABNORMAL LOW (ref >90)
GLUCOSE: 151 mg/dL — AB (ref 70–99)
POTASSIUM: 3.9 meq/L (ref 3.7–5.3)
Sodium: 142 mEq/L (ref 137–147)

## 2013-10-09 LAB — CARBOXYHEMOGLOBIN
Carboxyhemoglobin: 2.3 % — ABNORMAL HIGH (ref 0.5–1.5)
Methemoglobin: 0.9 % (ref 0.0–1.5)
O2 Saturation: 76.9 %
TOTAL HEMOGLOBIN: 9.6 g/dL — AB (ref 13.5–18.0)

## 2013-10-09 LAB — CBC
HEMATOCRIT: 30.4 % — AB (ref 39.0–52.0)
HEMOGLOBIN: 9.4 g/dL — AB (ref 13.0–17.0)
MCH: 25.4 pg — ABNORMAL LOW (ref 26.0–34.0)
MCHC: 30.9 g/dL (ref 30.0–36.0)
MCV: 82.2 fL (ref 78.0–100.0)
Platelets: 177 10*3/uL (ref 150–400)
RBC: 3.7 MIL/uL — ABNORMAL LOW (ref 4.22–5.81)
RDW: 17.9 % — ABNORMAL HIGH (ref 11.5–15.5)
WBC: 5.7 10*3/uL (ref 4.0–10.5)

## 2013-10-09 MED ORDER — CARVEDILOL 3.125 MG PO TABS
3.1250 mg | ORAL_TABLET | Freq: Two times a day (BID) | ORAL | Status: DC
  Administered 2013-10-09 – 2013-10-13 (×7): 3.125 mg via ORAL
  Filled 2013-10-09 (×11): qty 1

## 2013-10-09 MED ORDER — POTASSIUM CHLORIDE CRYS ER 20 MEQ PO TBCR
40.0000 meq | EXTENDED_RELEASE_TABLET | Freq: Once | ORAL | Status: AC
  Administered 2013-10-09: 40 meq via ORAL

## 2013-10-09 MED ORDER — HYDRALAZINE HCL 25 MG PO TABS
12.5000 mg | ORAL_TABLET | Freq: Four times a day (QID) | ORAL | Status: DC
  Administered 2013-10-09: 12.5 mg via ORAL
  Administered 2013-10-09: 15:00:00 via ORAL
  Administered 2013-10-09 – 2013-10-10 (×4): 12.5 mg via ORAL
  Filled 2013-10-09 (×12): qty 0.5

## 2013-10-09 MED ORDER — METOLAZONE 2.5 MG PO TABS
2.5000 mg | ORAL_TABLET | Freq: Every day | ORAL | Status: DC
  Administered 2013-10-09 – 2013-10-10 (×2): 2.5 mg via ORAL
  Filled 2013-10-09 (×2): qty 1

## 2013-10-09 MED ORDER — ISOSORBIDE MONONITRATE ER 30 MG PO TB24
30.0000 mg | ORAL_TABLET | Freq: Every day | ORAL | Status: DC
Start: 2013-10-09 — End: 2013-10-11
  Administered 2013-10-09 – 2013-10-11 (×3): 30 mg via ORAL
  Filled 2013-10-09 (×3): qty 1

## 2013-10-09 NOTE — Progress Notes (Signed)
   ELECTROPHYSIOLOGY ROUNDING NOTE    Patient Name: Marcus [name] Date of Encounter: 10/09/2013    SUBJECTIVE:Patient feels well this morning.  No chest pain, shortness of breath stable.  Some confusion last night per report.  Minimal incisional pain  TELEMETRY: Reviewed telemetry pt in AV pacing Filed Vitals:   10/08/13 2300 10/09/13 0200 10/09/13 0300 10/09/13 0500  BP:  122/69 84/55 107/48  Pulse: 60  61 58  Temp: 98.2 F (36.8 C) 98.7 F (37.1 C) 98.2 F (36.8 C)   TempSrc: Oral Oral Oral   Resp: 16  21 17   Height:      Weight:   160 lb 4.4 oz (72.7 kg)   SpO2: 98%  100% 94%    Intake/Output Summary (Last 24 hours) at 10/09/13 0631 Last data filed at 10/09/13 0600  Gross per 24 hour  Intake  344.8 ml  Output   1650 ml  Net -1305.2 ml    CURRENT MEDICATIONS: . amiodarone  200 mg Oral BID  . apixaban  2.5 mg Oral BID  . aspirin EC  81 mg Oral Daily  . carvedilol  6.25 mg Oral BID WC  .  ceFAZolin (ANCEF) IV  2 g Intravenous Q6H  . furosemide  80 mg Intravenous 3 times per day  . gentamicin irrigation   Irrigation Once  . potassium chloride  20 mEq Oral BID  . sodium chloride  3 mL Intravenous Q12H    LABS: Basic Metabolic Panel:  Recent Labs  10/06/13 1548  10/08/13 0500 10/09/13 0455  NA 146  < > 143 142  K 3.6*  < > 3.2* 3.9  CL 103  < > 104 105  CO2 28  < > 26 26  GLUCOSE 84  < > 133* 151*  BUN 46*  < > 35* 28*  CREATININE 2.02*  2.01*  < > 1.55* 1.47*  CALCIUM 9.4  < > 9.0 8.6  MG 2.5  --   --   --   < > = values in this interval not displayed. Liver Function Tests:  Recent Labs  10/06/13 1548  AST 23  ALT 17  ALKPHOS 53  BILITOT 1.1  PROT 7.6  ALBUMIN 3.6   CBC:  Recent Labs  10/08/13 1100 10/09/13 0455  WBC 5.5 5.7  HGB 10.0* 9.4*  HCT 31.9* 30.4*  MCV 82.4 82.2  PLT 148* 177   Thyroid Function Tests:  Recent Labs  10/06/13 1548  TSH 2.730     Radiology/Studies:  Post op chest x-ray pending  PHYSICAL  EXAM Right chest without hematoma  DEVICE INTERROGATION: Device interrogation pending   Active Problems:   Acute on chronic systolic heart failure    EP Attending  Patient seen and examined. Agree with above. [employer] for discharge when ok from CHF perspective Usual followup.  Mikle Bosworth.D.

## 2013-10-09 NOTE — Progress Notes (Signed)
Patient ID: Marcus [name], male   DOB: Nov 12, 1928, 80 y.o.   MRN: [id number] Advanced Heart Failure Rounding Note   Subjective:    Admitted from HF clinic with volume overload. He was started on Milrinone 0.25 mcg and IV Lasix 60 mg tid.   Co-ox 47% on milrinone 0.25 so milrinone increased to 0.375 mcg/kg/min.  I/O -640 so Lasix increased to 80 mg IV every 8 hours.  Weight is down.   He is walking better on milrinone and with diuresis.  Did pretty well yesterday.  Had CRT upgrade.    Creatinine 2.0>1.7>1.4   Objective:   Weight Range:  Vital Signs:   Temp:  [97.9 F (36.6 C)-98.7 F (37.1 C)] 98.2 F (36.8 C) (05/22 0300) Pulse Rate:  [58-66] 58 (05/22 0500) Resp:  [16-30] 17 (05/22 0500) BP: (84-146)/(48-81) 107/48 mmHg (05/22 0500) SpO2:  [94 %-100 %] 94 % (05/22 0500) Weight:  [72.7 kg (160 lb 4.4 oz)] 72.7 kg (160 lb 4.4 oz) (05/22 0300) Last BM Date: 10/05/13  Weight change: Filed Weights   10/07/13 1433 10/08/13 0500 10/09/13 0300  Weight: 76.6 kg (168 lb 14 oz) 73.3 kg (161 lb 9.6 oz) 72.7 kg (160 lb 4.4 oz)    Intake/Output:   Intake/Output Summary (Last 24 hours) at 10/09/13 [id number] Last data filed at 10/09/13 0600  Gross per 24 hour  Intake  344.8 ml  Output   1650 ml  Net -1305.2 ml     Physical Exam: General:  Well appearing. No resp difficulty HEENT: normal Neck: supple. JVP 10-12. Carotids 2+ bilat; no bruits. No lymphadenopathy or thryomegaly appreciated. Cor: PMI nondisplaced. Regular rate & rhythm. No rubs, gallops or murmurs. Lungs: clear Abdomen: soft, nontender, nondistended. No hepatosplenomegaly. No bruits or masses. Good bowel sounds. Extremities: no cyanosis, clubbing, rash, R and LLE 1+edema at ankles. Neuro: alert & orientedx3, cranial nerves grossly intact. moves all 4 extremities w/o difficulty. Affect pleasant  Telemetry: a-paced, BiV paced  Labs: Basic Metabolic Panel:  Recent Labs Lab 10/06/13 1548 10/07/13 0628  10/08/13 0500 10/09/13 0455  NA 146 143 143 142  K 3.6* 3.5* 3.2* 3.9  CL 103 105 104 105  CO2 28 28 26 26   GLUCOSE 84 95 133* 151*  BUN 46* 43* 35* 28*  CREATININE 2.02*  2.01* 1.76* 1.55* 1.47*  CALCIUM 9.4 8.8 9.0 8.6  MG 2.5  --   --   --     Liver Function Tests:  Recent Labs Lab 10/06/13 1548  AST 23  ALT 17  ALKPHOS 53  BILITOT 1.1  PROT 7.6  ALBUMIN 3.6   No results found for this basename: LIPASE, AMYLASE,  in the last 168 hours No results found for this basename: AMMONIA,  in the last 168 hours  CBC:  Recent Labs Lab 10/06/13 1548 10/08/13 1100 10/09/13 0455  WBC 4.3 5.5 5.7  HGB 11.0* 10.0* 9.4*  HCT 34.6* 31.9* 30.4*  MCV 82.2 82.4 82.2  PLT 165 148* 177    Cardiac Enzymes: No results found for this basename: CKTOTAL, CKMB, CKMBINDEX, TROPONINI,  in the last 168 hours  BNP: BNP (last 3 results)  Recent Labs  08/31/13 1159 09/30/13 1158 10/06/13 1548  PROBNP 1214.0* 4296.0* 6552.0*     Other results:  EKG:   Imaging: Dg Chest Port 1 View  10/07/2013   CLINICAL DATA:  Status post PICC line placement  EXAM: PORTABLE CHEST - 1 VIEW  COMPARISON:  DG CHEST 1V PORT dated 10/07/2013  FINDINGS: A PICC line is present via the left upper extremity with the tip in the midportion of the SVC. Increased pulmonary interstitial markings bilaterally. Cardiac silhouette top normal in size. Permanent pacemaker unchanged in position. Previous median sternotomy.  IMPRESSION: 1. No immediate postprocedure complication following placement of a PICC line via the left upper extremity. 2. Mild pulmonary interstitial edema. 3. These results will be called to the ordering clinician or representative by the Radiologist Assistant, and communication documented in the PACS or zVision Dashboard.   Electronically Signed   By: David  Martinique   On: 10/07/2013 10:59     Medications:     Scheduled Medications: . amiodarone  200 mg Oral BID  . apixaban  2.5 mg Oral BID  .  aspirin EC  81 mg Oral Daily  . carvedilol  6.25 mg Oral BID WC  .  ceFAZolin (ANCEF) IV  2 g Intravenous Q6H  . furosemide  80 mg Intravenous 3 times per day  . gentamicin irrigation   Irrigation Once  . hydrALAZINE  12.5 mg Oral QID  . isosorbide mononitrate  30 mg Oral Daily  . metolazone  2.5 mg Oral Daily  . potassium chloride  20 mEq Oral BID  . potassium chloride  40 mEq Oral Once  . sodium chloride  3 mL Intravenous Q12H    Infusions: . milrinone 0.375 mcg/kg/min (10/09/13 0238)    PRN Medications: sodium chloride, acetaminophen, ondansetron (ZOFRAN) IV, sodium chloride, sodium chloride   Assessment/Plan    1. Acute on chronic systolic CHF: Ischemic cardiomyopathy, EF 20-25%. NYHA class IIIb symptoms, has failed medical management at home. Volume status elevated but he is starting to diurese on milrinone and IV Lasix. He had been RV pacing but now has had CRT upgrade and is BiV pacing.  - Still volume overloaded.  Would like to see better diuresis.  Will add metolazone 2.5 mg daily.  I suspect that he will need a couple more days of aggressive diuresis.  Will need to reassess diuretic need daily over weekend. - Continue milrinone while getting IV diuresis. Would like to titrate down maybe starting on Sunday.  I will start low dose hydralazine/Imdur today.  Would gradually increase these as we begin to titrate down milrinone.  - No ACEI for now with elevated creatinine.  - Can continue Coreg 3.125 mg bid.      - I would plan for RHC after diuresis and titration off milrinone, probably next Tuesday (if he does not go home earlier).  2. Atrial fibrillation: Paroxysmal, currently pacing his atrium. Amiodarone increased to 200 mg bid for loading since he tolerated 200 daily without nausea.  If he does not tolerate amiodarone or it is not successful at NSR maintenance, would consider AV nodal ablation. Given elevated creatinine and age > 68, needs to be on Eliquis with dose 2.5 mg bid.   3. CAD: Stable. As he is on Eliquis with stable CAD, can stop ASA. He is currently not on a statin.  4. Sick sinus syndrome with [employer] PCM: Now upgraded to CRT.   5. CKD: Suspect component of cardiorenal syndrome.  Creatinine improving with milrinone and improved CO.    Length of Stay: [place] 10/09/2013, 6:52 AM

## 2013-10-09 NOTE — Progress Notes (Signed)
CARDIAC REHAB PHASE I   PRE:  Rate/Rhythm: 62 Pacing  BP:  Supine:   Sitting: 106/60  Standing:    SaO2: 100 RA  MODE:  Ambulation: 1440 ft   POST:  Rate/Rhythm: 113 Pacing  BP:  Supine:   Sitting: 125/61  Standing:    SaO2: 100 RA 1050-1120 Assisted X 1 to ambulate, just to push IV pole. Pt has fast pace. He walked 1440 feet without c/o, other than a little SOB at end of walk. He states that level ground walking is fine, he was SOB with incline. Pt to recliner after walk with call light in reach.  Rodney Langton RN 10/09/2013 11:18 AM

## 2013-10-09 NOTE — Progress Notes (Signed)
Pt refusing medications and having episode of confusion, disoriented to place and time;Cardiology MD notified and is aware that Pt is not getting Eliquis, Lasixs or Potassium on 5/21 @ 2200. MD also aware of change in Pt mental status; Pt vital signs remain stable; RN will continue to monitor pt for further changes.

## 2013-10-10 DIAGNOSIS — I5023 Acute on chronic systolic (congestive) heart failure: Secondary | ICD-10-CM | POA: Diagnosis not present

## 2013-10-10 DIAGNOSIS — I4891 Unspecified atrial fibrillation: Secondary | ICD-10-CM | POA: Diagnosis not present

## 2013-10-10 LAB — BASIC METABOLIC PANEL
BUN: 40 mg/dL — AB (ref 6–23)
CHLORIDE: 101 meq/L (ref 96–112)
CO2: 28 mEq/L (ref 19–32)
Calcium: 8.8 mg/dL (ref 8.4–10.5)
Creatinine, Ser: 1.82 mg/dL — ABNORMAL HIGH (ref 0.50–1.35)
GFR calc non Af Amer: 33 mL/min — ABNORMAL LOW (ref >90)
GFR, EST AFRICAN AMERICAN: 38 mL/min — AB (ref >90)
Glucose, Bld: 126 mg/dL — ABNORMAL HIGH (ref 70–99)
POTASSIUM: 3.2 meq/L — AB (ref 3.7–5.3)
Sodium: 142 mEq/L (ref 137–147)

## 2013-10-10 LAB — CBC
HEMATOCRIT: 30.3 % — AB (ref 39.0–52.0)
HEMOGLOBIN: 9.9 g/dL — AB (ref 13.0–17.0)
MCH: 26.3 pg (ref 26.0–34.0)
MCHC: 32.7 g/dL (ref 30.0–36.0)
MCV: 80.4 fL (ref 78.0–100.0)
Platelets: 174 10*3/uL (ref 150–400)
RBC: 3.77 MIL/uL — ABNORMAL LOW (ref 4.22–5.81)
RDW: 17.5 % — ABNORMAL HIGH (ref 11.5–15.5)
WBC: 6.8 10*3/uL (ref 4.0–10.5)

## 2013-10-10 LAB — CARBOXYHEMOGLOBIN
Carboxyhemoglobin: 2 % — ABNORMAL HIGH (ref 0.5–1.5)
Methemoglobin: 0.9 % (ref 0.0–1.5)
O2 Saturation: 64.3 %
Total hemoglobin: 12.7 g/dL — ABNORMAL LOW (ref 13.5–18.0)

## 2013-10-10 LAB — MAGNESIUM: MAGNESIUM: 2.2 mg/dL (ref 1.5–2.5)

## 2013-10-10 NOTE — Progress Notes (Signed)
Patient ID: Marcus [name], male   DOB: 31-Aug-1928, 80 y.o.   MRN: [id number] Advanced Heart Failure Rounding Note   Subjective:    80 y.o. male with a past medical history significant for CAD, ICM, PAF, and heart failure. He is S/p insertion of a left ventricular pacing lead, a biventricular pacemaker (10/08/2013)  Admitted from HF clinic with volume overload.  Co-ox 47% >>64.3%  On milrinone at 0.375 mcg/kg/min.  I/O -5.4L since admission. On Lasix increased to 80 mg IV every 8 hours and PO Metolazone 2.5mg  daily. Wt down by 5 lbs since admission. Wt now 162. Dry wt probably around 150lb  He is walking better on milrinone and with diuresis.  Did pretty well yesterday.  Had CRT upgrade.    Creatinine 2.0>1.7>1.4>1.82  He feels better today. He was able to get out and shave without much difficult. CVP checked personally = 2   Objective:   Weight Range:  Vital Signs:   Temp:  [97.8 F (36.6 C)-98.7 F (37.1 C)] 97.8 F (36.6 C) (05/23 0828) Pulse Rate:  [58-80] 60 (05/23 0828) Resp:  [20-22] 20 (05/23 0411) BP: (80-121)/(40-58) 91/58 mmHg (05/23 0828) SpO2:  [94 %-100 %] 95 % (05/23 0411) Weight:  [162 lb 7.7 oz (73.7 kg)] 162 lb 7.7 oz (73.7 kg) (05/23 0411) Last BM Date: 10/05/13  Weight change: Filed Weights   10/08/13 0500 10/09/13 0300 10/10/13 0411  Weight: 161 lb 9.6 oz (73.3 kg) 160 lb 4.4 oz (72.7 kg) 162 lb 7.7 oz (73.7 kg)    Intake/Output:   Intake/Output Summary (Last 24 hours) at 10/10/13 1236 Last data filed at 10/10/13 0905  Gross per 24 hour  Intake  686.2 ml  Output   3375 ml  Net -2688.8 ml     Physical Exam: General:  Well appearing. No resp difficulty HEENT: normal Neck: supple. JVP flat Carotids 2+ bilat; no bruits. Chest: pacemaker surgical site on right chest wall appears clean.  Cor: PMI nondisplaced. Regular rate & rhythm. No rubs, gallops or murmurs. Lungs: clear Abdomen: soft, nontender, nondistended. No hepatosplenomegaly. No  bruits or masses. Good bowel sounds. Extremities: no cyanosis, clubbing, rash, R and LLE no edema Neuro: alert & orientedx3, cranial nerves grossly intact. moves all 4 extremities w/o difficulty. Affect pleasant  Telemetry: a-paced, BiV paced  Labs: Basic Metabolic Panel:  Recent Labs Lab 10/06/13 1548 10/07/13 0628 10/08/13 0500 10/09/13 0455 10/10/13 0445  NA 146 143 143 142 142  K 3.6* 3.5* 3.2* 3.9 3.2*  CL 103 105 104 105 101  CO2 28 28 26 26 28   GLUCOSE 84 95 133* 151* 126*  BUN 46* 43* 35* 28* 40*  CREATININE 2.02*  2.01* 1.76* 1.55* 1.47* 1.82*  CALCIUM 9.4 8.8 9.0 8.6 8.8  MG 2.5  --   --   --   --    Liver Function Tests:  Recent Labs Lab 10/06/13 1548  AST 23  ALT 17  ALKPHOS 53  BILITOT 1.1  PROT 7.6  ALBUMIN 3.6    CBC:  Recent Labs Lab 10/06/13 1548 10/08/13 1100 10/09/13 0455 10/10/13 0445  WBC 4.3 5.5 5.7 6.8  HGB 11.0* 10.0* 9.4* 9.9*  HCT 34.6* 31.9* 30.4* 30.3*  MCV 82.2 82.4 82.2 80.4  PLT 165 148* 177 174     BNP: BNP (last 3 results)  Recent Labs  08/31/13 1159 09/30/13 1158 10/06/13 1548  PROBNP 1214.0* 4296.0* 6552.0*     Other results:   Imaging: Dg Chest 2  View  10/09/2013   CLINICAL DATA:  Post pacemaker  EXAM: CHEST  2 VIEW  COMPARISON:  10/07/2013  FINDINGS: RIGHT subclavian transvenous pacemaker leads project at RIGHT atrium, RIGHT ventricle and coronary sinus.  Enlargement of cardiac silhouette post median sternotomy.  Tortuous aorta.  Slight pulmonary vascular congestion.  Improved pulmonary edema.  Minimal basilar atelectasis.  No gross pleural effusion or pneumothorax.  Bones demineralized.  IMPRESSION: Improved pulmonary edema.  Minimal bibasilar atelectasis.   Electronically Signed   By: Lavonia Dana M.D.   On: 10/09/2013 08:20     Medications:     Scheduled Medications: . amiodarone  200 mg Oral BID  . apixaban  2.5 mg Oral BID  . carvedilol  3.125 mg Oral BID WC  . furosemide  80 mg Intravenous 3  times per day  . gentamicin irrigation   Irrigation Once  . hydrALAZINE  12.5 mg Oral QID  . isosorbide mononitrate  30 mg Oral Daily  . metolazone  2.5 mg Oral Daily  . potassium chloride  20 mEq Oral BID  . sodium chloride  3 mL Intravenous Q12H    Infusions: . milrinone 0.375 mcg/kg/min (10/10/13 0641)    PRN Medications: sodium chloride, acetaminophen, ondansetron (ZOFRAN) IV, sodium chloride, sodium chloride   Assessment/Plan    Acute on chronic systolic CHF: Ischemic cardiomyopathy, EF 20-25%. NYHA class IIIb symptoms, had failed medical management at home. He had been RV pacing but now has had CRT upgrade and is BiV pacing. Clinically feeling better. Plan - volume status appears much improved with now raising creatinine - CVP low -> stop all diuretics for today - Continue milrinone gtt to be titrated off  - on low dose hydralazine/Imdur which can be titrated upward as milrinone is weaned off.  - No ACEI for now with elevated creatinine.  - continue Coreg 3.125 mg bid.  Atrial fibrillation: Paroxysmal, currently in NSR CHADS2 score of 3 points with 5.9% annual risk for strokes.  Plan  - Amiodarone increased to 200 mg bid and tolerating it well without GI complaints. - consider AV ablation if amiodarone is ineffective or not tolerated due to S/E - cont with Eliquis with dose 2.5 mg bid with dose adjusted for age and renal function    CAD: Stable. Cont with Eliquis. No need for aspirin   Sick sinus syndrome with [employer] PCM: Now upgraded to CRT.    CKD: Suspect component of cardiorenal syndrome.  Creatinine initially better and now trending up. Hold diuretics  Normocytic Chronic Anemia: at baseline hgb 9-11. Today hgb 9.9. Likely related to CKD  Discussed with Dr Haroldine Laws.    Length of Stay: 4  Signed:  Jessee Avers, MD PGY-2 Internal Medicine Teaching Service Pager: [phone] 10/10/2013, 1:00 PM    Patient seen and examined with Dr. Alice Rieger. We  discussed all aspects of the encounter. I agree with the assessment and plan as stated above.   He is much improved. Now dry with CVP 2. Will hold all diuretics. Liberalize po. If renal function improved tomorrow will begin milrinone wean. Maintaining NSR. Continue amio and Eliquis.   Shaune Pascal Madisyn Mawhinney,MD 1:29 PM

## 2013-10-10 NOTE — Progress Notes (Signed)
Held 1800 hydralazine per Dr. Graciella Freer.

## 2013-10-10 NOTE — Progress Notes (Signed)
Alerted Dr. Alice Rieger to low BPs over last several hours. No new orders received.

## 2013-10-10 NOTE — Progress Notes (Signed)
CARDIAC REHAB PHASE I   PRE:  Rate/Rhythm: 65 pacing  BP:  Supine: 70/42,67/44  Sitting: 75/49  Standing:    SaO2: 99%RA  MODE:  Ambulation: 0 ft   1440-1456 Came to walk with pt.  Pt sleeping. Stated he has been sleepy all day which is unusual for him. Had pt sit on bed edge few minutes before taking BP at 75/49. Denied dizziness but stated he felt a little goofy in the head. Assisted back to lying position.  Pt stated he would be willing to try again later.  Will be unable to return today. Staff can walk at later time if BP better.    Graylon Good, RN BSN  10/10/2013 2:52 PM

## 2013-10-11 DIAGNOSIS — I4891 Unspecified atrial fibrillation: Secondary | ICD-10-CM | POA: Diagnosis not present

## 2013-10-11 DIAGNOSIS — I509 Heart failure, unspecified: Secondary | ICD-10-CM

## 2013-10-11 DIAGNOSIS — I5023 Acute on chronic systolic (congestive) heart failure: Secondary | ICD-10-CM | POA: Diagnosis not present

## 2013-10-11 LAB — BASIC METABOLIC PANEL
BUN: 47 mg/dL — ABNORMAL HIGH (ref 6–23)
CALCIUM: 9.5 mg/dL (ref 8.4–10.5)
CO2: 28 mEq/L (ref 19–32)
CREATININE: 1.73 mg/dL — AB (ref 0.50–1.35)
Chloride: 98 mEq/L (ref 96–112)
GFR calc Af Amer: 41 mL/min — ABNORMAL LOW (ref >90)
GFR, EST NON AFRICAN AMERICAN: 35 mL/min — AB (ref >90)
GLUCOSE: 134 mg/dL — AB (ref 70–99)
Potassium: 3.5 mEq/L — ABNORMAL LOW (ref 3.7–5.3)
Sodium: 138 mEq/L (ref 137–147)

## 2013-10-11 LAB — CARBOXYHEMOGLOBIN
Carboxyhemoglobin: 1.9 % — ABNORMAL HIGH (ref 0.5–1.5)
Methemoglobin: 0.9 % (ref 0.0–1.5)
O2 SAT: 67.8 %
TOTAL HEMOGLOBIN: 9.5 g/dL — AB (ref 13.5–18.0)

## 2013-10-11 MED ORDER — POTASSIUM CHLORIDE CRYS ER 20 MEQ PO TBCR
40.0000 meq | EXTENDED_RELEASE_TABLET | Freq: Once | ORAL | Status: AC
Start: 2013-10-11 — End: 2013-10-11
  Administered 2013-10-11: 40 meq via ORAL
  Filled 2013-10-11: qty 2

## 2013-10-11 MED ORDER — MILRINONE IN DEXTROSE 20 MG/100ML IV SOLN: 0.1250 ug/kg/min | INTRAVENOUS | Status: DC

## 2013-10-11 MED ORDER — TORSEMIDE 20 MG PO TABS
40.0000 mg | ORAL_TABLET | Freq: Every day | ORAL | Status: DC
  Administered 2013-10-11 – 2013-10-13 (×3): 40 mg via ORAL
  Filled 2013-10-11 (×3): qty 2

## 2013-10-11 NOTE — Discharge Instructions (Signed)
Information on my medicine - ELIQUIS (apixaban)  This medication education was reviewed with me or my healthcare representative as part of my discharge preparation.  The pharmacist that spoke with me during my hospital stay was:  Harolyn Rutherford, [facility]  Why was Eliquis prescribed for you? Eliquis was prescribed for you to reduce the risk of forming blood clots that can cause a stroke if you have a medical condition called atrial fibrillation (a type of irregular heartbeat) OR to reduce the risk of a blood clots forming after orthopedic surgery.  What do You need to know about Eliquis ? Take your Eliquis TWICE DAILY - one tablet in the morning and one tablet in the evening with or without food.  It would be best to take the doses about the same time each day.  If you have difficulty swallowing the tablet whole please discuss with your pharmacist how to take the medication safely.  Take Eliquis exactly as prescribed by your doctor and DO NOT stop taking Eliquis without talking to the doctor who prescribed the medication.  Stopping may increase your risk of developing a new clot or stroke.  Refill your prescription before you run out.  After discharge, you should have regular check-up appointments with your healthcare provider that is prescribing your Eliquis.  In the future your dose may need to be changed if your kidney function or weight changes by a significant amount or as you get older.  What do you do if you miss a dose? If you miss a dose, take it as soon as you remember on the same day and resume taking twice daily.  Do not take more than one dose of ELIQUIS at the same time.  Important Safety Information A possible side effect of Eliquis is bleeding. You should call your healthcare provider right away if you experience any of the following:   Bleeding from an injury or your nose that does not stop.   Unusual colored urine (red or dark brown) or unusual colored stools (red or  black).   Unusual bruising for unknown reasons.   A serious fall or if you hit your head (even if there is no bleeding).  Some medicines may interact with Eliquis and might increase your risk of bleeding or clotting while on Eliquis. To help avoid this, consult your healthcare provider or pharmacist prior to using any new prescription or non-prescription medications, including herbals, vitamins, non-steroidal anti-inflammatory drugs (NSAIDs) and supplements.  This website has more information on Eliquis (apixaban): [email or web address].

## 2013-10-11 NOTE — Progress Notes (Signed)
Patient ID: Marcus [name], male   DOB: 28-Mar-1929, 80 y.o.   MRN: [id number] Advanced Heart Failure Rounding Note   Subjective:    80 y.o. male with a past medical history significant for CAD, ICM, PAF, and heart failure. He is S/p insertion of a left ventricular pacing lead, a biventricular pacemaker (10/08/2013)  Admitted from HF clinic with volume overload.  Remains on milrinone at 0.375 mcg/kg/min. Diuretics stopped yesterday due to rising creatinine and CVP 2. Weight still down another 4 pounds. SBP 70-80s this am now 90s. + lightheaded. No BMET today. Hydralazine held.   CVP checked personally 10-11   Objective:   Weight Range:  Vital Signs:   Temp:  [97.4 F (36.3 C)-99 F (37.2 C)] 97.4 F (36.3 C) (05/24 1236) Pulse Rate:  [52-65] 58 (05/24 1236) Resp:  [12-21] 12 (05/24 1236) BP: (71-98)/(39-65) 89/48 mmHg (05/24 1236) SpO2:  [96 %-99 %] 98 % (05/24 1236) Weight:  [71.7 kg (158 lb 1.1 oz)] 71.7 kg (158 lb 1.1 oz) (05/24 0329) Last BM Date: 10/07/13 (per pt and spouse)  Weight change: Filed Weights   10/09/13 0300 10/10/13 0411 10/11/13 0329  Weight: 72.7 kg (160 lb 4.4 oz) 73.7 kg (162 lb 7.7 oz) 71.7 kg (158 lb 1.1 oz)    Intake/Output:   Intake/Output Summary (Last 24 hours) at 10/11/13 1330 Last data filed at 10/11/13 1000  Gross per 24 hour  Intake 1234.8 ml  Output   1075 ml  Net  159.8 ml     Physical Exam: General:  Well appearing. No resp difficulty HEENT: normal Neck: supple. JVP 9-10  Carotids 2+ bilat; no bruits. Chest: pacemaker surgical site on right chest wall appears clean.  Cor: PMI nondisplaced. Regular rate & rhythm. No rubs, gallops or murmurs. Lungs: clear Abdomen: soft, nontender, nondistended. No hepatosplenomegaly. No bruits or masses. Good bowel sounds. Extremities: no cyanosis, clubbing, rash, R and LLE no edema Neuro: alert & orientedx3, cranial nerves grossly intact. moves all 4 extremities w/o difficulty. Affect  pleasant  Telemetry: a-paced, BiV paced  Labs: Basic Metabolic Panel:  Recent Labs Lab 10/06/13 1548 10/07/13 0628 10/08/13 0500 10/09/13 0455 10/10/13 0445  NA 146 143 143 142 142  K 3.6* 3.5* 3.2* 3.9 3.2*  CL 103 105 104 105 101  CO2 28 28 26 26 28   GLUCOSE 84 95 133* 151* 126*  BUN 46* 43* 35* 28* 40*  CREATININE 2.02*  2.01* 1.76* 1.55* 1.47* 1.82*  CALCIUM 9.4 8.8 9.0 8.6 8.8  MG 2.5  --   --   --  2.2   Liver Function Tests:  Recent Labs Lab 10/06/13 1548  AST 23  ALT 17  ALKPHOS 53  BILITOT 1.1  PROT 7.6  ALBUMIN 3.6    CBC:  Recent Labs Lab 10/06/13 1548 10/08/13 1100 10/09/13 0455 10/10/13 0445  WBC 4.3 5.5 5.7 6.8  HGB 11.0* 10.0* 9.4* 9.9*  HCT 34.6* 31.9* 30.4* 30.3*  MCV 82.2 82.4 82.2 80.4  PLT 165 148* 177 174     BNP: BNP (last 3 results)  Recent Labs  08/31/13 1159 09/30/13 1158 10/06/13 1548  PROBNP 1214.0* 4296.0* 6552.0*     Other results:   Imaging: No results found.   Medications:     Scheduled Medications: . amiodarone  200 mg Oral BID  . apixaban  2.5 mg Oral BID  . carvedilol  3.125 mg Oral BID WC  . gentamicin irrigation   Irrigation Once  . hydrALAZINE  12.5 mg Oral QID  . isosorbide mononitrate  30 mg Oral Daily  . potassium chloride  20 mEq Oral BID    Infusions: . milrinone 0.375 mcg/kg/min (10/11/13 1059)    PRN Medications: sodium chloride, acetaminophen, ondansetron (ZOFRAN) IV, sodium chloride   Assessment/Plan    Acute on chronic systolic CHF: Ischemic cardiomyopathy, EF 20-25%. NYHA class IIIb symptoms, had failed medical management at home. He had been RV pacing but now has had CRT upgrade and is BiV pacing. Clinically feeling better.  Atrial fibrillation: Paroxysmal, currently in NSR CHADS2 score of 3 points with 5.9% annual risk for strokes.  Plan  - Amiodarone increased to 200 mg bid and tolerating it well without GI complaints. - consider AV ablation if amiodarone is  ineffective or not tolerated due to S/E - cont with Eliquis with dose 2.5 mg bid with dose adjusted for age and renal function    CAD: Stable. Cont with Eliquis. No need for aspirin   Sick sinus syndrome with Medtronic PCM: Now upgraded to CRT.    CKD:   Normocytic Chronic Anemia: at baseline hgb 9-11.    He is much improved. CVP now climbing again. Will restart oral lasix. Check BMET. Continue to hold hydral for now. Wean milrinone to 0.125. Keep in SDU to follow co-ox and CVPs. Maintaining NSR. Continue amio and Eliquis. Hopefully home tues or wed.   Shaela Boer R Khiana Camino,MD 1:30 PM

## 2013-10-12 DIAGNOSIS — I5023 Acute on chronic systolic (congestive) heart failure: Secondary | ICD-10-CM | POA: Diagnosis not present

## 2013-10-12 DIAGNOSIS — I4891 Unspecified atrial fibrillation: Secondary | ICD-10-CM | POA: Diagnosis not present

## 2013-10-12 LAB — BASIC METABOLIC PANEL
BUN: 50 mg/dL — ABNORMAL HIGH (ref 6–23)
CHLORIDE: 99 meq/L (ref 96–112)
CO2: 30 meq/L (ref 19–32)
Calcium: 9.7 mg/dL (ref 8.4–10.5)
Creatinine, Ser: 1.82 mg/dL — ABNORMAL HIGH (ref 0.50–1.35)
GFR calc Af Amer: 38 mL/min — ABNORMAL LOW (ref >90)
GFR calc non Af Amer: 33 mL/min — ABNORMAL LOW (ref >90)
GLUCOSE: 111 mg/dL — AB (ref 70–99)
POTASSIUM: 3.8 meq/L (ref 3.7–5.3)
SODIUM: 139 meq/L (ref 137–147)

## 2013-10-12 LAB — CARBOXYHEMOGLOBIN
Carboxyhemoglobin: 2 % — ABNORMAL HIGH (ref 0.5–1.5)
Methemoglobin: 0.9 % (ref 0.0–1.5)
O2 Saturation: 77.1 %
Total hemoglobin: 10.2 g/dL — ABNORMAL LOW (ref 13.5–18.0)

## 2013-10-12 NOTE — Progress Notes (Addendum)
Patient ID: Marcus [name], male   DOB: 23-May-1928, 80 y.o.   MRN: [id number] Advanced Heart Failure Rounding Note   Subjective:    80 y.o. male with a past medical history significant for CAD, ICM, PAF, and heart failure. He is S/p insertion of a left ventricular pacing lead, a biventricular pacemaker (10/08/2013)  Admitted from HF clinic with volume overload.  Milrinone weaned to 0.125 yesterday and diuretics restarted. Feels good. Weight down 4 pounds. Cr slowly getting worse. Co-ox looks good @ 77%. CVP 8. Hydralazine/nitrates held due to low BP.    Objective:   Weight Range:  Vital Signs:   Temp:  [97.4 F (36.3 C)-98.6 F (37 C)] 98.4 F (36.9 C) (05/25 0400) Pulse Rate:  [58] 58 (05/24 1236) Resp:  [12-21] 20 (05/25 0800) BP: (88-118)/(48-59) 109/59 mmHg (05/25 0800) SpO2:  [98 %-99 %] 99 % (05/25 0800) Weight:  [70.3 kg (154 lb 15.7 oz)] 70.3 kg (154 lb 15.7 oz) (05/25 0330) Last BM Date: 10/07/13  Weight change: Filed Weights   10/10/13 0411 10/11/13 0329 10/12/13 0330  Weight: 73.7 kg (162 lb 7.7 oz) 71.7 kg (158 lb 1.1 oz) 70.3 kg (154 lb 15.7 oz)    Intake/Output:   Intake/Output Summary (Last 24 hours) at 10/12/13 1028 Last data filed at 10/12/13 0800  Gross per 24 hour  Intake 1093.46 ml  Output   3600 ml  Net -2506.54 ml     Physical Exam: General:  Well appearing. No resp difficulty HEENT: normal Neck: supple. JVP 8  Carotids 2+ bilat; no bruits. Chest: pacemaker surgical site on right chest wall appears clean.  Cor: PMI nondisplaced. Regular rate & rhythm. No rubs, gallops or murmurs. Lungs: clear Abdomen: soft, nontender, nondistended. No hepatosplenomegaly. No bruits or masses. Good bowel sounds. Extremities: no cyanosis, clubbing, rash, R and LLE no edema Neuro: alert & orientedx3, cranial nerves grossly intact. moves all 4 extremities w/o difficulty. Affect pleasant  Telemetry: a-paced, BiV paced  Labs: Basic Metabolic Panel:  Recent  Labs Lab 10/06/13 1548  10/08/13 0500 10/09/13 0455 10/10/13 0445 10/11/13 1430 10/12/13 0430  NA 146  < > 143 142 142 138 139  K 3.6*  < > 3.2* 3.9 3.2* 3.5* 3.8  CL 103  < > 104 105 101 98 99  CO2 28  < > 26 26 28 28 30   GLUCOSE 84  < > 133* 151* 126* 134* 111*  BUN 46*  < > 35* 28* 40* 47* 50*  CREATININE 2.02*  2.01*  < > 1.55* 1.47* 1.82* 1.73* 1.82*  CALCIUM 9.4  < > 9.0 8.6 8.8 9.5 9.7  MG 2.5  --   --   --  2.2  --   --   < > = values in this interval not displayed. Liver Function Tests:  Recent Labs Lab 10/06/13 1548  AST 23  ALT 17  ALKPHOS 53  BILITOT 1.1  PROT 7.6  ALBUMIN 3.6    CBC:  Recent Labs Lab 10/06/13 1548 10/08/13 1100 10/09/13 0455 10/10/13 0445  WBC 4.3 5.5 5.7 6.8  HGB 11.0* 10.0* 9.4* 9.9*  HCT 34.6* 31.9* 30.4* 30.3*  MCV 82.2 82.4 82.2 80.4  PLT 165 148* 177 174     BNP: BNP (last 3 results)  Recent Labs  08/31/13 1159 09/30/13 1158 10/06/13 1548  PROBNP 1214.0* 4296.0* 6552.0*     Other results:   Imaging: No results found.   Medications:     Scheduled Medications: .  amiodarone  200 mg Oral BID  . apixaban  2.5 mg Oral BID  . carvedilol  3.125 mg Oral BID WC  . gentamicin irrigation   Irrigation Once  . potassium chloride  20 mEq Oral BID  . torsemide  40 mg Oral Daily    Infusions: . milrinone 0.125 mcg/kg/min (10/11/13 2000)    PRN Medications: sodium chloride, acetaminophen, ondansetron (ZOFRAN) IV, sodium chloride   Assessment/Plan    Acute on chronic systolic CHF: Ischemic cardiomyopathy, EF 20-25%. NYHA class IIIb symptoms, had failed medical management at home. He had been RV pacing but now has had CRT upgrade and is BiV pacing. Clinically feeling better.  Atrial fibrillation: Paroxysmal, currently in NSR CHADS2 score of 3 points with 5.9% annual risk for strokes.  Plan  - Amiodarone increased to 200 mg bid and tolerating it well without GI complaints. - consider AV ablation if amiodarone  is ineffective or not tolerated due to S/E - cont with Eliquis with dose 2.5 mg bid with dose adjusted for age and renal function    CAD: Stable. Cont with Eliquis. No need for aspirin   Sick sinus syndrome with Medtronic PCM: Now upgraded to CRT.    CKD:   Normocytic Chronic Anemia: at baseline hgb 9-11.    Overall much improved. Co-ox and CVP look good. Creatinine climbing a bit but really not too far from recent baseline. Stop milrinone.  Will continue diuretics today. Can adjust tomorrow as needed. If everything stable (CVP, co-ox and renal function) possibly home in am. Continue Eliquis and amio.    Shaune Pascal Bensimhon,MD 10:28 AM

## 2013-10-13 ENCOUNTER — Encounter: Payer: Medicare Other | Admitting: Internal Medicine

## 2013-10-13 ENCOUNTER — Encounter (HOSPITAL_COMMUNITY): Payer: Self-pay | Admitting: Anesthesiology

## 2013-10-13 DIAGNOSIS — I2510 Atherosclerotic heart disease of native coronary artery without angina pectoris: Secondary | ICD-10-CM | POA: Diagnosis not present

## 2013-10-13 DIAGNOSIS — I5023 Acute on chronic systolic (congestive) heart failure: Secondary | ICD-10-CM | POA: Diagnosis not present

## 2013-10-13 LAB — CARBOXYHEMOGLOBIN
Carboxyhemoglobin: 1.6 % — ABNORMAL HIGH (ref 0.5–1.5)
METHEMOGLOBIN: 0.8 % (ref 0.0–1.5)
O2 SAT: 70.6 %
Total hemoglobin: 11 g/dL — ABNORMAL LOW (ref 13.5–18.0)

## 2013-10-13 LAB — BASIC METABOLIC PANEL
BUN: 51 mg/dL — AB (ref 6–23)
CO2: 35 mEq/L — ABNORMAL HIGH (ref 19–32)
Calcium: 9.8 mg/dL (ref 8.4–10.5)
Chloride: 90 mEq/L — ABNORMAL LOW (ref 96–112)
Creatinine, Ser: 1.54 mg/dL — ABNORMAL HIGH (ref 0.50–1.35)
GFR calc Af Amer: 47 mL/min — ABNORMAL LOW (ref >90)
GFR calc non Af Amer: 40 mL/min — ABNORMAL LOW (ref >90)
Glucose, Bld: 130 mg/dL — ABNORMAL HIGH (ref 70–99)
Potassium: 3.1 mEq/L — ABNORMAL LOW (ref 3.7–5.3)
SODIUM: 135 meq/L — AB (ref 137–147)

## 2013-10-13 MED ORDER — POTASSIUM CHLORIDE CRYS ER 20 MEQ PO TBCR
20.0000 meq | EXTENDED_RELEASE_TABLET | Freq: Every day | ORAL | Status: DC
  Administered 2013-10-13: 20 meq via ORAL

## 2013-10-13 MED ORDER — CARVEDILOL 3.125 MG PO TABS: 3.1250 mg | ORAL_TABLET | Freq: Two times a day (BID) | ORAL | Status: DC

## 2013-10-13 MED ORDER — SPIRONOLACTONE 25 MG PO TABS: 12.5000 mg | ORAL_TABLET | Freq: Every day | ORAL | Status: DC

## 2013-10-13 MED ORDER — HYDRALAZINE HCL 10 MG PO TABS
5.0000 mg | ORAL_TABLET | Freq: Three times a day (TID) | ORAL | Status: DC
  Filled 2013-10-13 (×5): qty 1

## 2013-10-13 MED ORDER — AMIODARONE HCL 200 MG PO TABS: 200.0000 mg | ORAL_TABLET | Freq: Two times a day (BID) | ORAL | Status: DC

## 2013-10-13 MED ORDER — POTASSIUM CHLORIDE CRYS ER 20 MEQ PO TBCR
40.0000 meq | EXTENDED_RELEASE_TABLET | Freq: Once | ORAL | Status: AC
  Administered 2013-10-13: 40 meq via ORAL
  Filled 2013-10-13: qty 2

## 2013-10-13 MED ORDER — TORSEMIDE 20 MG PO TABS: ORAL_TABLET | ORAL | Status: DC

## 2013-10-13 MED ORDER — ISOSORBIDE MONONITRATE 15 MG HALF TABLET
15.0000 mg | ORAL_TABLET | Freq: Every day | ORAL | Status: DC
  Administered 2013-10-13: 15 mg via ORAL
  Filled 2013-10-13: qty 1

## 2013-10-13 MED ORDER — ISOSORBIDE MONONITRATE ER 30 MG PO TB24: 15.0000 mg | ORAL_TABLET | Freq: Every day | ORAL | Status: DC

## 2013-10-13 MED ORDER — SPIRONOLACTONE 12.5 MG HALF TABLET
12.5000 mg | ORAL_TABLET | Freq: Every day | ORAL | Status: DC
Start: 2013-10-13 — End: 2013-10-13
  Administered 2013-10-13: 12.5 mg via ORAL
  Filled 2013-10-13: qty 1

## 2013-10-13 MED ORDER — HYDRALAZINE HCL 10 MG PO TABS: 5.0000 mg | ORAL_TABLET | Freq: Three times a day (TID) | ORAL | Status: DC

## 2013-10-13 NOTE — Progress Notes (Signed)
Discharged home accompanied by wife , discharge instructions and belongings given to pt.

## 2013-10-13 NOTE — Progress Notes (Signed)
CARDIAC REHAB PHASE I   Pt ready for d/c and walked yesterday without problems. Reviewed HF, daily wts, low sodium, ex and CRPII with wife present. Interested in [place] and will send referral to [facility]. Also discussed ICD restrictions. Voiced understanding. [id number]  [place], ACSM 10/13/2013 11:07 AM

## 2013-10-13 NOTE — Progress Notes (Addendum)
Patient ID: Marcus [name], male   DOB: Aug 30, 1928, 80 y.o.   MRN: [id number] Advanced Heart Failure Rounding Note   Subjective:    80 y.o. male with a past medical history significant for CAD, ICM, PAF, and heart failure. He is S/p insertion of a left ventricular pacing lead, a biventricular pacemaker (10/08/2013)  Admitted from HF clinic with volume overload.  Milrinone stopped yesterday. Weight down 1 lb and 24 hr I/O -2.5 liters.  Creatinine improved 1.54 and co-ox 71%. CVP 5. Denies SOB, orthopnea or CP.    Objective:   Weight Range:  Vital Signs:   Temp:  [97.5 F (36.4 C)-98.5 F (36.9 C)] 98.3 F (36.8 C) (05/26 0312) Pulse Rate:  [60-61] 60 (05/26 0400) Resp:  [14-20] 18 (05/25 1600) BP: (102-122)/(56-90) 122/59 mmHg (05/26 0400) SpO2:  [97 %-100 %] 100 % (05/26 0400) Weight:  [153 lb 14.1 oz (69.8 kg)] 153 lb 14.1 oz (69.8 kg) (05/26 0400) Last BM Date: 10/07/13  Weight change: Filed Weights   10/11/13 0329 10/12/13 0330 10/13/13 0400  Weight: 158 lb 1.1 oz (71.7 kg) 154 lb 15.7 oz (70.3 kg) 153 lb 14.1 oz (69.8 kg)    Intake/Output:   Intake/Output Summary (Last 24 hours) at 10/13/13 0658 Last data filed at 10/13/13 0600  Gross per 24 hour  Intake  295.4 ml  Output   2850 ml  Net -2554.6 ml     Physical Exam: General:  Well appearing. No resp difficulty, lying flat in bed HEENT: normal Neck: supple. JVP flat  Carotids 2+ bilat; no bruits. Chest: pacemaker surgical site on right chest wall appears clean.  Cor: PMI nondisplaced. Regular rate & rhythm. No rubs, gallops or murmurs. Lungs: clear Abdomen: soft, nontender, nondistended. No hepatosplenomegaly. No bruits or masses. Good bowel sounds. Extremities: no cyanosis, clubbing, rash, no edema Neuro: alert & orientedx3, cranial nerves grossly intact. moves all 4 extremities w/o difficulty. Affect pleasant  Telemetry: a-paced, BiV paced  Labs: Basic Metabolic Panel:  Recent Labs Lab 10/06/13 1548   10/09/13 0455 10/10/13 0445 10/11/13 1430 10/12/13 0430 10/13/13 0330  NA 146  < > 142 142 138 139 135*  K 3.6*  < > 3.9 3.2* 3.5* 3.8 3.1*  CL 103  < > 105 101 98 99 90*  CO2 28  < > 26 28 28 30  35*  GLUCOSE 84  < > 151* 126* 134* 111* 130*  BUN 46*  < > 28* 40* 47* 50* 51*  CREATININE 2.02*  2.01*  < > 1.47* 1.82* 1.73* 1.82* 1.54*  CALCIUM 9.4  < > 8.6 8.8 9.5 9.7 9.8  MG 2.5  --   --  2.2  --   --   --   < > = values in this interval not displayed. Liver Function Tests:  Recent Labs Lab 10/06/13 1548  AST 23  ALT 17  ALKPHOS 53  BILITOT 1.1  PROT 7.6  ALBUMIN 3.6    CBC:  Recent Labs Lab 10/06/13 1548 10/08/13 1100 10/09/13 0455 10/10/13 0445  WBC 4.3 5.5 5.7 6.8  HGB 11.0* 10.0* 9.4* 9.9*  HCT 34.6* 31.9* 30.4* 30.3*  MCV 82.2 82.4 82.2 80.4  PLT 165 148* 177 174     BNP: BNP (last 3 results)  Recent Labs  08/31/13 1159 09/30/13 1158 10/06/13 1548  PROBNP 1214.0* 4296.0* 6552.0*     Other results:   Imaging: No results found.   Medications:     Scheduled Medications: . amiodarone  200 mg Oral BID  . apixaban  2.5 mg Oral BID  . carvedilol  3.125 mg Oral BID WC  . gentamicin irrigation   Irrigation Once  . potassium chloride  20 mEq Oral BID  . torsemide  40 mg Oral [name]    Infusions:    PRN Medications: sodium chloride, acetaminophen, ondansetron (ZOFRAN) IV, sodium chloride   Assessment/Plan    Acute on chronic systolic CHF: Ischemic cardiomyopathy, EF 20-25%. NYHA class IIIb symptoms, had failed medical management at home. He had been RV pacing but now has had CRT upgrade and is BiV pacing. Clinically feeling better.  Weight down 11 lbs total.  CVP 7 today, co-ox 71%.   Atrial fibrillation: Paroxysmal, currently in NSR CHADS2 score of 3 points with 5.9% annual risk for strokes.  Plan  - Amiodarone increased to 200 mg bid and tolerating it well without GI complaints. - consider AV ablation if amiodarone is ineffective  or not tolerated due to S/E - cont with Eliquis with dose 2.5 mg bid with dose adjusted for age and renal function    CAD: Stable. Cont with Eliquis. No need for aspirin   Sick sinus syndrome with [employer]tr[employer] PCM: Now upgraded to CRT.    CKD: Creatinine lower today.   Normocytic Chronic Anemia: at baseline hgb 9-11.   Much improved. Volume status stable will send home on torsemide 40 mg q am and 20 mg q pm. Will try to start low dose hydralazine 5 mg TID and Imdur 15 mg [name]. Will send home today with [facility] and PT with close follow up in HF clinic next Monday. Continue eliquis 2.5 mg BID and amio 200 mg BID.  Rande Brunt, NP-C 6:58 AM  Patient seen with NP, agree with the above note.  Doing much better since upgrade to CRT.  Good co-ox, CVP down to 7.  Diuresed 11 lbs since admission.  Will plan on discharge home today.  Will need followup in 1 week.  - Continue Eliquis/amiodarone.  - Continue Coreg, add low dose hydralazine/nitrates (5 mg tid + 15 mg [name]).  - Torsemide 40 qam/20 qpm (may need to adjust).  - K low, add spironolactone 12.5 mg [name].  Will need to follow BMET closely.   [name] 10/13/2013 7:26 AM

## 2013-10-13 NOTE — Discharge Summary (Signed)
Advanced Heart Failure Team  Discharge Summary   Patient ID: Marcus [name] MRN: [id number], DOB/AGE: 01/20/1929 80 y.o. Admit date: 10/06/2013 D/C date:     10/13/2013   Primary Discharge Diagnoses:  1) Acute on chronic systolic HF - Diuresed on milrinone 0.25 and IV lasix, net negative diuresis -9.9 liters. Discharge weight 153 lbs.  - EF 20-25%, mild AI, mod Marcus, RV mildly dilated and sys fx mildly reduced, RA mildly dilated (09/2013) 2) Atrial fibrillation - paroxysmal, on amiodarone 200 mg BID and eliquis 2.5 mg BID. Secondary Discharge Diagnoses:  1) CAD 2) Sick sinus syndrome - upgraded to CRT 10/08/2013 3) CKD stage III  Hospital Course:  Marcus [name] is an 80 yo with history of pacemaker for bradycardia, paroxysmal atrial fibrillation, CAD s/p CABG and PCIs, ICM and chronic systolic HF.   Presented to cardiology appt on 5/19 with marked volume overload. PCM interrogation prior to visit showed RV pacing 85% of the time. There was concern with his rising creatinine that he had low output and was suffering from cardiorenal syndrome and was admitted. Pertinent labs on admission were pro-BNP 4296, K+ 3.8, creatinine 1.86 and Hgb 11.  He was started on milrinone 0.25 along with lasix 60 mg IV TID. He was transferred from telemetry to the ICU for PICC placement and to follow co-ox's. Initial co-ox 47%. His BB was placed on hold for hypotension. EP was consulted in regard to CRT upgrade and made recommendations to proceed with CRT-D. He underwent CRT on [id number] with no complications. His amiodarone was increased in the hospital from 200 mg daily to 200 mg BID for loading and it was discussed if he did not tolerate of it was not successful at NSR maintenance that AV node ablation would be considered. He continued to diurese and his milrinone was slowly weaned off and he was transitioned to PO demadex.   He ambulated in the halls with no SOB and on day of discharge denied any SOB, orthopnea or CP.  He was maintaining NSR with a pacing and BIV pacing. His creatinine improved to 1.54. He was started on low dose hydralazine 5 mg TID and Imdur 15 mg daily for afterload reduction along with spiro 12.5 mg daily. Will need BMET on follow up in clinic next week.    Discharge Weight Range: 153-157 lbs Discharge Vitals: Blood pressure 117/72, pulse 62, temperature 98.2 F (36.8 C), temperature source Oral, resp. rate 14, height 5\' 8"  (1.727 m), weight 153 lb 14.1 oz (69.8 kg), SpO2 98.00%.  Labs: Lab Results  Component Value Date   WBC 6.8 10/10/2013   HGB 9.9* 10/10/2013   HCT 30.3* 10/10/2013   MCV 80.4 10/10/2013   PLT 174 10/10/2013     Recent Labs Lab 10/13/13 0330  NA 135*  K 3.1*  CL 90*  CO2 35*  BUN 51*  CREATININE 1.54*  CALCIUM 9.8  GLUCOSE 130*   Lab Results  Component Value Date   CHOL 160 02/02/2013   HDL 72.20 02/02/2013   LDLCALC 77 02/02/2013   TRIG 56.0 02/02/2013   BNP (last 3 results)  Recent Labs  08/31/13 1159 09/30/13 1158 10/06/13 1548  PROBNP 1214.0* 4296.0* 6552.0*    Diagnostic Studies/Procedures   CRT-D 10/08/13  ECHO 10/07/13 Left ventricle: The cavity size was mildly dilated. Wall thickness was normal. Systolic function was severely reduced. The estimated ejection fraction was in the range of 20% to 25%. Akinesis and scarring of the inferolateral, inferior, and inferoseptal myocardium.  Severe hypokinesis of the apical myocardium. Doppler parameters are consistent with restrictive physiology, indicative of decreased left ventricular diastolic compliance and/or increased left atrial pressure. No evidence of thrombus. - Aortic valve: There was mild regurgitation. - Mitral valve: There was moderate regurgitation directed centrally. Diastolic regurgitation was present. - Left atrium: The atrium was moderately dilated. - Right ventricle: The cavity size was mildly dilated. Wall thickness was normal. Systolic function was mildly reduced. -  Right atrium: The atrium was mildly dilated. - Atrial septum: No defect or patent foramen ovale was identified. - Tricuspid valve: There was moderate regurgitation. - Pulmonary arteries: Systolic pressure was moderately increased. PA peak pressure: 54 mm Hg (S).    Discharge Medications     Medication List         amiodarone 200 MG tablet  Commonly known as:  PACERONE  Take 1 tablet (200 mg total) by mouth 2 (two) times daily.     apixaban 2.5 MG Tabs tablet  Commonly known as:  ELIQUIS  Take 1 tablet (2.5 mg total) by mouth 2 (two) times daily.     CALCIUM PO  Take 1 tablet by mouth daily.     carvedilol 3.125 MG tablet  Commonly known as:  COREG  Take 1 tablet (3.125 mg total) by mouth 2 (two) times daily with a meal.     COQ10 PO  Take 1-3 tablets by mouth See admin instructions. Take 3 tablets by mouth in the morning, and 1 tablet by mouth in the evening.     hydrALAZINE 10 MG tablet  Commonly known as:  APRESOLINE  Take 0.5 tablets (5 mg total) by mouth every 8 (eight) hours.     isosorbide mononitrate 30 MG 24 hr tablet  Commonly known as:  IMDUR  Take 0.5 tablets (15 mg total) by mouth daily.     OMEGA-3 FISH OIL PO  Take 30 mLs by mouth daily.     potassium chloride SA 20 MEQ tablet  Commonly known as:  K-DUR,KLOR-CON  Take 20 mEq by mouth daily.     spironolactone 25 MG tablet  Commonly known as:  ALDACTONE  Take 0.5 tablets (12.5 mg total) by mouth daily.     torsemide 20 MG tablet  Commonly known as:  DEMADEX  Take 40 mg (2 tabs) in AM and 20 mg (1 tab) in PM     VITAMIN C PO  Take 1 tablet by mouth daily.     VITAMIN D PO  Take 1 capsule by mouth daily.     VITAMIN E PO  Take 1 capsule by mouth daily.        Disposition   The patient will be discharged in stable condition to home. Discharge Instructions   Amb Referral to Cardiac Rehabilitation    Complete by:  As directed      Beta Blocker already ordered    Complete by:  As  directed      Contraindication to ACEI at discharge    Complete by:  As directed      Diet - low sodium heart healthy    Complete by:  As directed      Discharge instructions    Complete by:  As directed   Please bring all your medications to your visit including your bottles.     Heart Failure patients record your daily weight using the same scale at the same time of day    Complete by:  As directed  Increase activity slowly    Complete by:  As directed      PICC line removal    Complete by:  As directed      STOP any activity that causes chest pain, shortness of breath, dizziness, sweating, or exessive weakness    Complete by:  As directed           Follow-up Information   Follow up with [facility] On 10/19/2013. (Heart Failure Clinic; @ 2:30 pm; Gate Code 0400. Bring all your medications to your visit. )    Specialty:  Cardiology   Contact information:   [id number] [place] [address] [phone]        Duration of Discharge Encounter: Greater than 35 minutes   Signed, Rande Brunt  10/13/2013, 4:48 PM

## 2013-10-14 ENCOUNTER — Telehealth (HOSPITAL_COMMUNITY): Payer: Self-pay | Admitting: *Deleted

## 2013-10-14 ENCOUNTER — Encounter (HOSPITAL_COMMUNITY): Payer: Medicare Other

## 2013-10-14 NOTE — Telephone Encounter (Signed)
Marcus [name] called to let us know that pt refused [name], he is sch to see [place] back on 6/1, we can rerefer if needed and pt agreeable

## 2013-10-19 ENCOUNTER — Ambulatory Visit (INDEPENDENT_AMBULATORY_CARE_PROVIDER_SITE_OTHER): Payer: Medicare Other | Admitting: *Deleted

## 2013-10-19 ENCOUNTER — Ambulatory Visit (HOSPITAL_COMMUNITY)
Admission: RE | Admit: 2013-10-19 | Discharge: 2013-10-19 | Disposition: A | Discharge status: Home / Self Care | Payer: Medicare Other | Source: Ambulatory Visit | Attending: Internal Medicine | Admitting: Internal Medicine

## 2013-10-19 VITALS — BP 106/68 | HR 67 | Wt 157.1 lb

## 2013-10-19 DIAGNOSIS — I2510 Atherosclerotic heart disease of native coronary artery without angina pectoris: Secondary | ICD-10-CM | POA: Diagnosis not present

## 2013-10-19 DIAGNOSIS — I498 Other specified cardiac arrhythmias: Secondary | ICD-10-CM | POA: Insufficient documentation

## 2013-10-19 DIAGNOSIS — Z95810 Presence of automatic (implantable) cardiac defibrillator: Secondary | ICD-10-CM | POA: Insufficient documentation

## 2013-10-19 DIAGNOSIS — I2589 Other forms of chronic ischemic heart disease: Secondary | ICD-10-CM | POA: Diagnosis not present

## 2013-10-19 DIAGNOSIS — I4891 Unspecified atrial fibrillation: Secondary | ICD-10-CM

## 2013-10-19 DIAGNOSIS — N183 Chronic kidney disease, stage 3 unspecified: Secondary | ICD-10-CM | POA: Diagnosis not present

## 2013-10-19 DIAGNOSIS — I509 Heart failure, unspecified: Secondary | ICD-10-CM | POA: Diagnosis not present

## 2013-10-19 DIAGNOSIS — I5022 Chronic systolic (congestive) heart failure: Secondary | ICD-10-CM

## 2013-10-19 DIAGNOSIS — I129 Hypertensive chronic kidney disease with stage 1 through stage 4 chronic kidney disease, or unspecified chronic kidney disease: Secondary | ICD-10-CM | POA: Diagnosis not present

## 2013-10-19 DIAGNOSIS — Z8546 Personal history of malignant neoplasm of prostate: Secondary | ICD-10-CM | POA: Diagnosis not present

## 2013-10-19 DIAGNOSIS — I495 Sick sinus syndrome: Secondary | ICD-10-CM | POA: Diagnosis not present

## 2013-10-19 DIAGNOSIS — Z951 Presence of aortocoronary bypass graft: Secondary | ICD-10-CM | POA: Insufficient documentation

## 2013-10-19 LAB — MDC_IDC_ENUM_SESS_TYPE_INCLINIC
Battery Voltage: 3.09 V
Brady Statistic AP VS Percent: 0.11 %
Brady Statistic AS VP Percent: 11.26 %
Brady Statistic AS VS Percent: 1.54 %
Brady Statistic RV Percent Paced: 98.35 %
Date Time Interrogation Session: 20150601172106
Lead Channel Impedance Value: 323 Ohm
Lead Channel Impedance Value: 323 Ohm
Lead Channel Impedance Value: 342 Ohm
Lead Channel Impedance Value: 361 Ohm
Lead Channel Impedance Value: 361 Ohm
Lead Channel Impedance Value: 361 Ohm
Lead Channel Impedance Value: 684 Ohm
Lead Channel Impedance Value: 722 Ohm
Lead Channel Impedance Value: 817 Ohm
Lead Channel Pacing Threshold Amplitude: 0.75 V
Lead Channel Pacing Threshold Pulse Width: 0.4 ms
Lead Channel Setting Pacing Amplitude: 2.5 V
Lead Channel Setting Pacing Pulse Width: 0.4 ms
MDC IDC MSMT LEADCHNL LV PACING THRESHOLD PULSEWIDTH: 0.4 ms
MDC IDC MSMT LEADCHNL RA SENSING INTR AMPL: 0.875 mV
MDC IDC MSMT LEADCHNL RV PACING THRESHOLD AMPLITUDE: 0.75 V
MDC IDC SET LEADCHNL RA PACING AMPLITUDE: 2 V
MDC IDC SET LEADCHNL RV PACING AMPLITUDE: 3.5 V
MDC IDC SET LEADCHNL RV PACING PULSEWIDTH: 0.4 ms
MDC IDC SET LEADCHNL RV SENSING SENSITIVITY: 0.9 mV
MDC IDC SET ZONE DETECTION INTERVAL: 350 ms
MDC IDC STAT BRADY AP VP PERCENT: 87.08 %
MDC IDC STAT BRADY RA PERCENT PACED: 87.19 %
Zone Setting Detection Interval: 400 ms

## 2013-10-19 LAB — BASIC METABOLIC PANEL
BUN: 78 mg/dL — AB (ref 6–23)
CHLORIDE: 97 meq/L (ref 96–112)
CO2: 29 meq/L (ref 19–32)
CREATININE: 2.01 mg/dL — AB (ref 0.50–1.35)
Calcium: 9.9 mg/dL (ref 8.4–10.5)
GFR calc Af Amer: 34 mL/min — ABNORMAL LOW (ref >90)
GFR calc non Af Amer: 29 mL/min — ABNORMAL LOW (ref >90)
GLUCOSE: 93 mg/dL (ref 70–99)
Potassium: 4.8 mEq/L (ref 3.7–5.3)
Sodium: 138 mEq/L (ref 137–147)

## 2013-10-19 MED ORDER — APIXABAN 2.5 MG PO TABS: 2.5000 mg | ORAL_TABLET | Freq: Two times a day (BID) | ORAL | Status: DC

## 2013-10-19 MED ORDER — CARVEDILOL 3.125 MG PO TABS: ORAL_TABLET | ORAL | Status: DC

## 2013-10-19 NOTE — Progress Notes (Signed)
Patient ID: Marcus [name], male   DOB: 01-14-1929, 80 y.o.   MRN: [id number]  Primary cardiologist: Dr. Acie Fredrickson PCP:    80 yo with history of pacemaker for bradycardia, paroxysmal atrial fibrillation, CAD s/p CABG and PCIs, and ischemic cardiomyopathy presents for CHF clinic evaluation.  Over the last few months, patient has had a symptomatic decline.  He seems to be in atrial fibrillation more often (but not persistently) which may contribute to symptoms.  He took amiodarone for about 10 days but felt sick/nauseated after starting it so stopped it. Recent PCM interrogation showed that he is RV pacing 85% of the time. He has had progressive dypsnea.  He is short of breath walking around in his house or with any incline.  No chest pain.  He has orthopnea.  Torsemide has been increased over the last several weeks without much effect other than worsening creatinine.   Admitted 5/19-5/26/15 with volume overload. He was diuresed with milrinone and IV lasix and discharge weight was 153 lbs. He underwent upgrade to his ICD to CRT-D as well. His amiodarone was increased to 200 mg BID.   [facility] Follow up for Heart Failure: Recently discharged from the hospital after CRT- upgrade. Denies SOB, PND, orthopnea or CP. No nausea with amiodarone and reports having a good appetite. Strength improving. Has not done much at home, but has been walking in his yard with no issues. Denies dizziness. Following a low salt diet and drinking less than 2L a day. At last appointment, patient was not in atrial fibrillation.  Therefore, I tried him again on amiodarone at 200 mg daily.  He seems to be tolerating this reasonably well without nausea.  I again increased his torsemide.  I spoke with Dr. Lovena Le about CRT upgrade as well.  Since last appointment, he has not felt any better and seems to be getting worse.  Dyspnea with almost any exertion.  Leg swelling continues to get worse.    Labs (9/14). LDL 77 Labs (4/15): K  3.6, creatinine 1.9 Labs (5/15): K 3.8, creatinine 1.86  ECG: a-paced, v-paced (last appt)  PMH:  1. Symptomatic bradycardia: Medtronic CRT-D 2. HTN 3. Hyperlipidemia 4. Atrial fibrillation: History of prior DCCV.  Paroxysmal.  On Eliquis.  Has history of cardioembolism to right leg with right popliteal and tibial embolectomy (had been off Eliquis). Has had nausea with amiodarone in the past.  5. Prostate cancer 6. CAD: CABG remotely with SVG-OM and LIMA-LAD.  Late 1990s had PCI to RCA.  In 12/09 had LHC with total occlusion of SVG-OM, patent LIMA-LAD, DES to LCx and DES to RCA.   7. Ischemic cardiomyopathy: Echo (3/15) with EF 20-25%, wall motion abnormalities noted, mild to moderately decreased RV systolic function. ICD upgraded to CRT-D 10/08/13.  8. CKD  SH: Married, nonsmoker, lives in [place]  [place]: CAD  ROS: All systems reviewed and negative except as per HPI.   Current Outpatient Prescriptions  Medication Sig Dispense Refill  . amiodarone (PACERONE) 200 MG tablet Take 1 tablet (200 mg total) by mouth 2 (two) times daily.  60 tablet  3  . apixaban (ELIQUIS) 2.5 MG TABS tablet Take 1 tablet (2.5 mg total) by mouth 2 (two) times daily.  60 tablet  2  . Ascorbic Acid (VITAMIN C PO) Take 1 tablet by mouth daily.       Marland Kitchen CALCIUM PO Take 1 tablet by mouth daily.       . carvedilol (COREG) 3.125 MG tablet Take  1 tablet (3.125 mg total) by mouth 2 (two) times daily with a meal.  60 tablet  3  . Cholecalciferol (VITAMIN D PO) Take 1 capsule by mouth daily.       . Coenzyme Q10 (COQ10 PO) Take 1-3 tablets by mouth See admin instructions. Take 3 tablets by mouth in the morning, and 1 tablet by mouth in the evening.      . hydrALAZINE (APRESOLINE) 10 MG tablet Take 0.5 tablets (5 mg total) by mouth every 8 (eight) hours.  45 tablet  3  . isosorbide mononitrate (IMDUR) 30 MG 24 hr tablet Take 0.5 tablets (15 mg total) by mouth daily.  30 tablet  3  . Omega-3 Fatty Acids (OMEGA-3 FISH OIL  PO) Take 30 mLs by mouth daily.      . potassium chloride SA (K-DUR,KLOR-CON) 20 MEQ tablet Take 20 mEq by mouth daily.      Marland Kitchen spironolactone (ALDACTONE) 25 MG tablet Take 0.5 tablets (12.5 mg total) by mouth daily.  15 tablet  3  . torsemide (DEMADEX) 20 MG tablet Take 40 mg (2 tabs) in AM and 20 mg (1 tab) in PM  90 tablet  3  . VITAMIN E PO Take 1 capsule by mouth daily.        No current facility-administered medications for this encounter.    Filed Vitals:   10/19/13 1432  BP: 106/68  Pulse: 67  Weight: 157 lb 2 oz (71.271 kg)  SpO2: 100%   General: NAD, wife present Neck: JVP 6 cm, no thyromegaly or thyroid nodule.  Lungs: Clear to auscultation bilaterally with normal respiratory effort. CV: Nondisplaced PMI.  Heart regular S1/S2, 2/6 HSM apex. No edema.  No carotid bruit.  Normal pedal pulses.  Abdomen: Soft, nontender, no hepatosplenomegaly, no distention.  Skin: Intact without lesions or rashes.  Neurologic: Alert and oriented x 3.  Psych: Normal affect. Extremities: No clubbing or cyanosis.   Assessment/Plan:  1. Chronic systolic CHF: ICM s/p CRT-D, EF 20-25% (09/2013)   - Reviewed discharge summary and patient recently admitted with volume overload and low output. Diuresed with IV lasix and milrinone. During stay had upgrade to CRT-D.  - Doing much better s/p CRT-D. NYHA II-III symptoms and volume status stable. Will continue torsemide 40 mg q am and 20 mg q pm. Check BMET today. - Will increase coreg to 3.125 mg q am and 6.25 mg q pm. Discussed with patient if any fatigue or dizziness to call the clinic. - Will continue low dose hydralazine 5 mg TID and Imdur 15 mg daily for afterload reduction. Continue spiro 12.5 mg daily.  - Reinforced the need and importance of daily weights, a low sodium diet, and fluid restriction (less than 2 L a day). Instructed to call the HF clinic if weight increases more than 3 lbs overnight or 5 lbs in a week.  2. Atrial fibrillation:  Paroxysmal. Need EKG next visit. Seems to be doing much better s/p CRT-D. Will continue amiodarone 200 mg BID. He is not having any nausea and has a good appetite. Going for defibrillator check this afternoon and should have results of full interrogation. Once his ICD is ~3 months out will enroll in out HF Optivol program where he will transmit once a month. Continue BB and Eliquis 2.5 mg BID. 3. CAD: Stable. No s/s of ischemia. Continue medical management. He is not currently on a statin. Consider adding next visit.  4. Sick sinus syndrome: s/p CRT-D. 5. CKD, stage  III - baseline creatinine 1.5-1.8. Check BMET today  Follow up in 1 month   Rande Brunt 10/19/2013  Addendum: Creatinine elevated from 1.5 in the hospital to 2.01. Did not appear to have any volume on board. Will hold torsemide for 2 days and then decrease to 20 mg BID. Will recheck BMET and pro-BNP in 7-10 days. Increased BB at OV and rise in Cr could be from low-output will continue to follow closely.

## 2013-10-19 NOTE — Progress Notes (Signed)
Wound check appointment. Steri-strips removed. Wound with swelling and brusing--pt to be rechecked by GT. Incision edges approximated, wound well healed. Normal device function. Thresholds, sensing, and impedances consistent with implant measurements. Device programmed at 3.5V/auto capture programmed on for extra safety margin until 3 month visit. Histogram distribution appropriate for patient and level of activity. Pt in AF 6.0% of time. Longest episode was 6 hours. No high ventricular rates noted. Patient educated about wound care, arm mobility, lifting restrictions. ROV in 3 months with GT. Pt also scheduled for wound check with GT on 10-20-13 @ 1100 due to hematoma.

## 2013-10-19 NOTE — Patient Instructions (Signed)
Increase your coreg to 3.125 mg (1 tablet) in the morning and 6.25 mg (2 tablets) in the evening. Call if you have any dizziness.  Call any issues.  F/U 5 weeks  Do the following things EVERYDAY: 1) Weigh yourself in the morning before breakfast. Write it down and keep it in a log. 2) Take your medicines as prescribed 3) Eat low salt foods-Limit salt (sodium) to 2000 mg per day.  4) Stay as active as you can everyday 5) Limit all fluids for the day to less than 2 liters 6)

## 2013-10-20 ENCOUNTER — Telehealth (HOSPITAL_COMMUNITY): Payer: Self-pay

## 2013-10-20 ENCOUNTER — Telehealth: Payer: Self-pay | Admitting: Nurse Practitioner

## 2013-10-20 ENCOUNTER — Ambulatory Visit (INDEPENDENT_AMBULATORY_CARE_PROVIDER_SITE_OTHER): Payer: Medicare Other | Admitting: *Deleted

## 2013-10-20 DIAGNOSIS — Z950 Presence of cardiac pacemaker: Secondary | ICD-10-CM

## 2013-10-20 LAB — MDC_IDC_ENUM_SESS_TYPE_INCLINIC

## 2013-10-20 NOTE — Progress Notes (Signed)
Wound re-check only. Hematoma present. Pt instructed to stop eliquis until next Wed. ROV 10/28/13---Dr. Allred will inspect per Dr. Lovena Le.

## 2013-10-20 NOTE — Telephone Encounter (Signed)
Left message for patient to return call regarding lab results.  Needs to decrease torsemide to 20mg  twice daily. Renee Pain

## 2013-10-20 NOTE — Telephone Encounter (Signed)
Message copied by Emmaline Life on Tue Oct 20, 2013 11:35 AM ------      Message from: MCNEILL, KRISTIN      Created: Mon Oct 19, 2013  5:14 PM      Regarding: handicap sticker       Pt was in for wound check and was inquiring about handicap sticker. Pt recently had blood clot and generator change for device. Pt would like to get information for receiving sticker.             Marcus [name]       ------

## 2013-10-20 NOTE — Telephone Encounter (Signed)
Left message for patient and informed him that handicap sticker requests are obtained from [facility].  I left our fax number and my phone number on the message and advised patient to call back with questions or concerns.

## 2013-10-21 ENCOUNTER — Telehealth (HOSPITAL_COMMUNITY): Payer: Self-pay

## 2013-10-21 MED ORDER — TORSEMIDE 20 MG PO TABS: 20.0000 mg | ORAL_TABLET | Freq: Two times a day (BID) | ORAL | Status: DC

## 2013-10-21 NOTE — Telephone Encounter (Signed)
Wife returned phone message concerning lab work and med change.  Lab results reviewed with wife of patient, instructed to hold patient's torsemide x 2 days, then decrease daily dosage to 1 tablet twice daily.  BMET and P-BNP scheduled at our office 6/1-/15 at 12:00 for recheck.  Aware and agreeable. Renee Pain

## 2013-10-22 ENCOUNTER — Ambulatory Visit (INDEPENDENT_AMBULATORY_CARE_PROVIDER_SITE_OTHER): Payer: Medicare Other | Admitting: Internal Medicine

## 2013-10-22 ENCOUNTER — Encounter: Payer: Self-pay | Admitting: Internal Medicine

## 2013-10-22 VITALS — BP 106/64 | HR 76 | Ht 68.0 in | Wt 159.0 lb

## 2013-10-22 DIAGNOSIS — T82837A Hemorrhage of cardiac prosthetic devices, implants and grafts, initial encounter: Secondary | ICD-10-CM

## 2013-10-22 DIAGNOSIS — T82897A Other specified complication of cardiac prosthetic devices, implants and grafts, initial encounter: Secondary | ICD-10-CM

## 2013-10-22 NOTE — Assessment & Plan Note (Signed)
His pocket has bled. I discussed the treatment options. It is bleeding through a stitch and the incision line is closed. We have cut the stitch and placed steristrips. If it bleeds again we have asked him to call us to come in for pocket hematoma evacuation. Hopefully the bleeding will stop off of his anti-coagulation.

## 2013-10-22 NOTE — Progress Notes (Signed)
HPI [name] returns today for ongoing followup of his PPM pocket hematoma. He has been on Eliquis and when we saw his hematoma several days ago the blood thinner was stopped. This morning he had some bleeding via the medial side of his pocket. He denies fever or chills.  No Known Allergies   Current Outpatient Prescriptions  Medication Sig Dispense Refill  . amiodarone (PACERONE) 200 MG tablet Take 1 tablet (200 mg total) by mouth 2 (two) times daily.  60 tablet  3  . Ascorbic Acid (VITAMIN C PO) Take 1 tablet by mouth daily.       Marland Kitchen CALCIUM PO Take 1 tablet by mouth daily.       . carvedilol (COREG) 3.125 MG tablet Take 3.125 mg (1 tablet) in the morning and 6.25 mg (2 tablets) in the evening.  90 tablet  3  . Cholecalciferol (VITAMIN D PO) Take 1 capsule by mouth daily.       . Coenzyme Q10 (COQ10 PO) Take 1-3 tablets by mouth See admin instructions. Take 3 tablets by mouth in the morning, and 1 tablet by mouth in the evening.      . hydrALAZINE (APRESOLINE) 10 MG tablet Take 0.5 tablets (5 mg total) by mouth every 8 (eight) hours.  45 tablet  3  . isosorbide mononitrate (IMDUR) 30 MG 24 hr tablet Take 0.5 tablets (15 mg total) by mouth daily.  30 tablet  3  . Omega-3 Fatty Acids (OMEGA-3 FISH OIL PO) Take 30 mLs by mouth daily.      . potassium chloride SA (K-DUR,KLOR-CON) 20 MEQ tablet Take 20 mEq by mouth daily.      Marland Kitchen spironolactone (ALDACTONE) 25 MG tablet Take 0.5 tablets (12.5 mg total) by mouth daily.  15 tablet  3  . torsemide (DEMADEX) 20 MG tablet Take 20 mg by mouth once.      Marland Kitchen VITAMIN E PO Take 1 capsule by mouth daily.        No current facility-administered medications for this visit.     Past Medical History  Diagnosis Date  . Coronary artery disease     a. s/p remote CABG x 2(VG->OM, LIMA->LAD;  b. Late 90's s/p PCI of RCA;  c. 12/09 Cath/PCI: LM 80m, 70-80d (3.0x37mm Xience DES), LAD100p, LCX 80-90p (2.25x50mmTaxus Atom DES), RCA 100d (2.5x98mm Xience  DES), VG->OM 100, LIMA->LAD nl, EF 30-35%.  . Hyperlipidemia   . Prostate cancer   . PAF (paroxysmal atrial fibrillation)   . Ischemic cardiomyopathy     a. EF 35-40% by echo 05/2012, b. EF 20-25%, akinesis and scarring of inferolateral, inferior and inferoseptal myocardium, mild AI, mod MR, LA mod dilated, RV mildly dialted and sys fx mildly reduced, RA mildly dilated (09/2013)   . Symptomatic bradycardia     a. 08/2003 s/p MDT Enpulse [id number] Dual chamber PPM ser # [id number] H.  . Atrial fibrillation   . CHF (congestive heart failure)     a. CRT-D (10/08/2013)  . Peripheral vascular disease     ROS:   All systems reviewed and negative except as noted in the HPI.   Past Surgical History  Procedure Laterality Date  . Insert / replace / remove pacemaker    . Doppler echocardiography  06/24/2002    EF 60-65%  . Tee without cardioversion  06/26/2012    Procedure: TRANSESOPHAGEAL ECHOCARDIOGRAM (TEE);  Surgeon: Lelon Perla, MD;  Location: [facility];  Service: Cardiovascular;  Laterality: [name];  .  Cardioversion  06/26/2012    Procedure: CARDIOVERSION;  Surgeon: Lelon Perla, MD;  Location: [facility];  Service: Cardiovascular;  Laterality: [name];  . Appendectomy    . Angioplasty      stent placement  . Embolectomy Right 07/28/2013    Procedure: Right Popliteal and Tibial Embolectomy;  Surgeon: Angelia Mould, MD;  Location: [facility] OR;  Service: Vascular;  Laterality: Right;  . Coronary artery bypass graft  1990     Family History  Problem Relation Age of Onset  . Hypertension Mother   . Colon cancer Mother   . Cancer Mother     Colon  . Alcohol abuse Father      History   Social History  . Marital Status: Married    Spouse Name: [name]    Number of Children: 0  . Years of Education: [name]   Occupational History  . retired    Social History Main Topics  . Smoking status: Former Smoker    Quit date: 02/10/1971  . Smokeless tobacco: Never Used  . Alcohol Use: No  .  Drug Use: No  . Sexual Activity: No   Other Topics Concern  . Not on file   Social History Narrative   Lives in [place] with wife.  Retired.     BP 106/64  Pulse 76  Ht 5\' 8"  (1.727 m)  Wt 159 lb (72.122 kg)  BMI 24.18 kg/m2  Physical Exam:  Well appearing NAD HEENT: Unremarkable Neck:  No JVD, no thyromegally Lymphatics:  No adenopathy Back:  No CVA tenderness Lungs:  Clear. Pocket hematoma is present HEART:  Regular rate rhythm, no murmurs, no rubs, no clicks Abd:  soft, positive bowel sounds, no organomegally, no rebound, no guarding Ext:  2 plus pulses, no edema, no cyanosis, no clubbing Skin:  No rashes no nodules Neuro:  CN II through XII intact, motor grossly intact    Assess/Plan:

## 2013-10-22 NOTE — Patient Instructions (Addendum)
Keep appointment for 10/28/13  If still oozing tomorrow will call and send over to the hospital for pocket evacuation

## 2013-10-23 DIAGNOSIS — R269 Unspecified abnormalities of gait and mobility: Secondary | ICD-10-CM | POA: Diagnosis not present

## 2013-10-23 DIAGNOSIS — N189 Chronic kidney disease, unspecified: Secondary | ICD-10-CM | POA: Diagnosis not present

## 2013-10-23 DIAGNOSIS — I129 Hypertensive chronic kidney disease with stage 1 through stage 4 chronic kidney disease, or unspecified chronic kidney disease: Secondary | ICD-10-CM | POA: Diagnosis not present

## 2013-10-23 DIAGNOSIS — I5023 Acute on chronic systolic (congestive) heart failure: Secondary | ICD-10-CM | POA: Diagnosis not present

## 2013-10-23 DIAGNOSIS — I4891 Unspecified atrial fibrillation: Secondary | ICD-10-CM | POA: Diagnosis not present

## 2013-10-23 DIAGNOSIS — I2510 Atherosclerotic heart disease of native coronary artery without angina pectoris: Secondary | ICD-10-CM | POA: Diagnosis not present

## 2013-10-26 DIAGNOSIS — I5023 Acute on chronic systolic (congestive) heart failure: Secondary | ICD-10-CM | POA: Diagnosis not present

## 2013-10-26 DIAGNOSIS — R269 Unspecified abnormalities of gait and mobility: Secondary | ICD-10-CM | POA: Diagnosis not present

## 2013-10-26 DIAGNOSIS — N189 Chronic kidney disease, unspecified: Secondary | ICD-10-CM | POA: Diagnosis not present

## 2013-10-26 DIAGNOSIS — I2510 Atherosclerotic heart disease of native coronary artery without angina pectoris: Secondary | ICD-10-CM | POA: Diagnosis not present

## 2013-10-26 DIAGNOSIS — I129 Hypertensive chronic kidney disease with stage 1 through stage 4 chronic kidney disease, or unspecified chronic kidney disease: Secondary | ICD-10-CM | POA: Diagnosis not present

## 2013-10-26 DIAGNOSIS — I4891 Unspecified atrial fibrillation: Secondary | ICD-10-CM | POA: Diagnosis not present

## 2013-10-27 DIAGNOSIS — I4891 Unspecified atrial fibrillation: Secondary | ICD-10-CM | POA: Diagnosis not present

## 2013-10-27 DIAGNOSIS — I2510 Atherosclerotic heart disease of native coronary artery without angina pectoris: Secondary | ICD-10-CM | POA: Diagnosis not present

## 2013-10-27 DIAGNOSIS — I129 Hypertensive chronic kidney disease with stage 1 through stage 4 chronic kidney disease, or unspecified chronic kidney disease: Secondary | ICD-10-CM | POA: Diagnosis not present

## 2013-10-27 DIAGNOSIS — N189 Chronic kidney disease, unspecified: Secondary | ICD-10-CM | POA: Diagnosis not present

## 2013-10-27 DIAGNOSIS — R269 Unspecified abnormalities of gait and mobility: Secondary | ICD-10-CM | POA: Diagnosis not present

## 2013-10-27 DIAGNOSIS — I5023 Acute on chronic systolic (congestive) heart failure: Secondary | ICD-10-CM | POA: Diagnosis not present

## 2013-10-28 ENCOUNTER — Ambulatory Visit (INDEPENDENT_AMBULATORY_CARE_PROVIDER_SITE_OTHER): Payer: Medicare Other | Admitting: *Deleted

## 2013-10-28 ENCOUNTER — Ambulatory Visit (HOSPITAL_COMMUNITY)
Admission: RE | Admit: 2013-10-28 | Discharge: 2013-10-28 | Disposition: A | Discharge status: Home / Self Care | Payer: Medicare Other | Source: Ambulatory Visit | Attending: Internal Medicine | Admitting: Internal Medicine

## 2013-10-28 DIAGNOSIS — T82897A Other specified complication of cardiac prosthetic devices, implants and grafts, initial encounter: Secondary | ICD-10-CM

## 2013-10-28 DIAGNOSIS — I5023 Acute on chronic systolic (congestive) heart failure: Secondary | ICD-10-CM | POA: Insufficient documentation

## 2013-10-28 DIAGNOSIS — T82837A Hemorrhage of cardiac prosthetic devices, implants and grafts, initial encounter: Secondary | ICD-10-CM

## 2013-10-28 DIAGNOSIS — I4891 Unspecified atrial fibrillation: Secondary | ICD-10-CM | POA: Diagnosis not present

## 2013-10-28 DIAGNOSIS — N189 Chronic kidney disease, unspecified: Secondary | ICD-10-CM | POA: Diagnosis not present

## 2013-10-28 DIAGNOSIS — I509 Heart failure, unspecified: Secondary | ICD-10-CM | POA: Insufficient documentation

## 2013-10-28 DIAGNOSIS — I2510 Atherosclerotic heart disease of native coronary artery without angina pectoris: Secondary | ICD-10-CM | POA: Diagnosis not present

## 2013-10-28 DIAGNOSIS — I5022 Chronic systolic (congestive) heart failure: Secondary | ICD-10-CM

## 2013-10-28 DIAGNOSIS — R269 Unspecified abnormalities of gait and mobility: Secondary | ICD-10-CM | POA: Diagnosis not present

## 2013-10-28 DIAGNOSIS — I129 Hypertensive chronic kidney disease with stage 1 through stage 4 chronic kidney disease, or unspecified chronic kidney disease: Secondary | ICD-10-CM | POA: Diagnosis not present

## 2013-10-28 LAB — PRO B NATRIURETIC PEPTIDE: Pro B Natriuretic peptide (BNP): 1728 pg/mL — ABNORMAL HIGH (ref 0–450)

## 2013-10-28 LAB — BASIC METABOLIC PANEL
BUN: 57 mg/dL — ABNORMAL HIGH (ref 6–23)
CHLORIDE: 100 meq/L (ref 96–112)
CO2: 23 mEq/L (ref 19–32)
Calcium: 10.2 mg/dL (ref 8.4–10.5)
Creatinine, Ser: 1.82 mg/dL — ABNORMAL HIGH (ref 0.50–1.35)
GFR, EST AFRICAN AMERICAN: 38 mL/min — AB (ref >90)
GFR, EST NON AFRICAN AMERICAN: 33 mL/min — AB (ref >90)
Glucose, Bld: 73 mg/dL (ref 70–99)
POTASSIUM: 5.3 meq/L (ref 3.7–5.3)
SODIUM: 139 meq/L (ref 137–147)

## 2013-10-29 LAB — MDC_IDC_ENUM_SESS_TYPE_INCLINIC

## 2013-10-29 NOTE — Progress Notes (Signed)
Wound re-check only, no device interrogation. Removed steri-strips. Pt still bleeding at same spot along incision. Pressure dressing applied per Dr. Rayann Heman. Pt instructed to continue to refrain from his eliquis. Wound re-check 11/02/13.

## 2013-10-30 DIAGNOSIS — R269 Unspecified abnormalities of gait and mobility: Secondary | ICD-10-CM | POA: Diagnosis not present

## 2013-10-30 DIAGNOSIS — I5023 Acute on chronic systolic (congestive) heart failure: Secondary | ICD-10-CM | POA: Diagnosis not present

## 2013-10-30 DIAGNOSIS — I2510 Atherosclerotic heart disease of native coronary artery without angina pectoris: Secondary | ICD-10-CM | POA: Diagnosis not present

## 2013-10-30 DIAGNOSIS — N189 Chronic kidney disease, unspecified: Secondary | ICD-10-CM | POA: Diagnosis not present

## 2013-10-30 DIAGNOSIS — I129 Hypertensive chronic kidney disease with stage 1 through stage 4 chronic kidney disease, or unspecified chronic kidney disease: Secondary | ICD-10-CM | POA: Diagnosis not present

## 2013-10-30 DIAGNOSIS — I4891 Unspecified atrial fibrillation: Secondary | ICD-10-CM | POA: Diagnosis not present

## 2013-11-02 ENCOUNTER — Ambulatory Visit (INDEPENDENT_AMBULATORY_CARE_PROVIDER_SITE_OTHER): Payer: Medicare Other | Admitting: *Deleted

## 2013-11-02 DIAGNOSIS — I5023 Acute on chronic systolic (congestive) heart failure: Secondary | ICD-10-CM

## 2013-11-02 DIAGNOSIS — I255 Ischemic cardiomyopathy: Secondary | ICD-10-CM

## 2013-11-02 DIAGNOSIS — I2589 Other forms of chronic ischemic heart disease: Secondary | ICD-10-CM

## 2013-11-02 DIAGNOSIS — I4891 Unspecified atrial fibrillation: Secondary | ICD-10-CM

## 2013-11-02 DIAGNOSIS — I495 Sick sinus syndrome: Secondary | ICD-10-CM

## 2013-11-03 DIAGNOSIS — R269 Unspecified abnormalities of gait and mobility: Secondary | ICD-10-CM | POA: Diagnosis not present

## 2013-11-03 DIAGNOSIS — N189 Chronic kidney disease, unspecified: Secondary | ICD-10-CM | POA: Diagnosis not present

## 2013-11-03 DIAGNOSIS — I4891 Unspecified atrial fibrillation: Secondary | ICD-10-CM | POA: Diagnosis not present

## 2013-11-03 DIAGNOSIS — I2510 Atherosclerotic heart disease of native coronary artery without angina pectoris: Secondary | ICD-10-CM | POA: Diagnosis not present

## 2013-11-03 DIAGNOSIS — I5023 Acute on chronic systolic (congestive) heart failure: Secondary | ICD-10-CM | POA: Diagnosis not present

## 2013-11-03 DIAGNOSIS — I129 Hypertensive chronic kidney disease with stage 1 through stage 4 chronic kidney disease, or unspecified chronic kidney disease: Secondary | ICD-10-CM | POA: Diagnosis not present

## 2013-11-03 NOTE — Progress Notes (Signed)
Wound reassessed by SK for hematoma. Stitch removed from incision site---betadine and band aids applied. Hematoma is smaller per wife. Eliquis restarted per SK. Patient to F/U on 6-18 while GT is here to reevaluate hematoma.

## 2013-11-05 ENCOUNTER — Ambulatory Visit (INDEPENDENT_AMBULATORY_CARE_PROVIDER_SITE_OTHER): Payer: Medicare Other | Admitting: *Deleted

## 2013-11-05 ENCOUNTER — Encounter: Payer: Self-pay | Admitting: Internal Medicine

## 2013-11-05 DIAGNOSIS — I495 Sick sinus syndrome: Secondary | ICD-10-CM

## 2013-11-05 NOTE — Progress Notes (Signed)
GT reviewed wound check and looking much better with minimal oozing. Pt to be rechecked on 11-25-13 @ 900.

## 2013-11-06 DIAGNOSIS — R269 Unspecified abnormalities of gait and mobility: Secondary | ICD-10-CM | POA: Diagnosis not present

## 2013-11-06 DIAGNOSIS — I5023 Acute on chronic systolic (congestive) heart failure: Secondary | ICD-10-CM | POA: Diagnosis not present

## 2013-11-06 DIAGNOSIS — I4891 Unspecified atrial fibrillation: Secondary | ICD-10-CM | POA: Diagnosis not present

## 2013-11-06 DIAGNOSIS — I129 Hypertensive chronic kidney disease with stage 1 through stage 4 chronic kidney disease, or unspecified chronic kidney disease: Secondary | ICD-10-CM | POA: Diagnosis not present

## 2013-11-06 DIAGNOSIS — I2510 Atherosclerotic heart disease of native coronary artery without angina pectoris: Secondary | ICD-10-CM | POA: Diagnosis not present

## 2013-11-06 DIAGNOSIS — N189 Chronic kidney disease, unspecified: Secondary | ICD-10-CM | POA: Diagnosis not present

## 2013-11-10 DIAGNOSIS — I129 Hypertensive chronic kidney disease with stage 1 through stage 4 chronic kidney disease, or unspecified chronic kidney disease: Secondary | ICD-10-CM | POA: Diagnosis not present

## 2013-11-10 DIAGNOSIS — I4891 Unspecified atrial fibrillation: Secondary | ICD-10-CM | POA: Diagnosis not present

## 2013-11-10 DIAGNOSIS — R269 Unspecified abnormalities of gait and mobility: Secondary | ICD-10-CM | POA: Diagnosis not present

## 2013-11-10 DIAGNOSIS — I5023 Acute on chronic systolic (congestive) heart failure: Secondary | ICD-10-CM | POA: Diagnosis not present

## 2013-11-10 DIAGNOSIS — N189 Chronic kidney disease, unspecified: Secondary | ICD-10-CM | POA: Diagnosis not present

## 2013-11-10 DIAGNOSIS — I2510 Atherosclerotic heart disease of native coronary artery without angina pectoris: Secondary | ICD-10-CM | POA: Diagnosis not present

## 2013-11-11 DIAGNOSIS — I2510 Atherosclerotic heart disease of native coronary artery without angina pectoris: Secondary | ICD-10-CM | POA: Diagnosis not present

## 2013-11-11 DIAGNOSIS — N189 Chronic kidney disease, unspecified: Secondary | ICD-10-CM | POA: Diagnosis not present

## 2013-11-11 DIAGNOSIS — R269 Unspecified abnormalities of gait and mobility: Secondary | ICD-10-CM | POA: Diagnosis not present

## 2013-11-11 DIAGNOSIS — I129 Hypertensive chronic kidney disease with stage 1 through stage 4 chronic kidney disease, or unspecified chronic kidney disease: Secondary | ICD-10-CM | POA: Diagnosis not present

## 2013-11-11 DIAGNOSIS — I5023 Acute on chronic systolic (congestive) heart failure: Secondary | ICD-10-CM | POA: Diagnosis not present

## 2013-11-11 DIAGNOSIS — I4891 Unspecified atrial fibrillation: Secondary | ICD-10-CM | POA: Diagnosis not present

## 2013-11-20 ENCOUNTER — Telehealth (HOSPITAL_COMMUNITY): Payer: Self-pay | Admitting: Anesthesiology

## 2013-11-20 NOTE — Telephone Encounter (Signed)
Called patient about lab results. K+ 5.3 and creatinine 1.82. Told to stop potassium. Will repeat BMET at appt this coming week.   Junie Bame B 9:29 AM

## 2013-11-24 NOTE — Progress Notes (Signed)
Patient ID: Marcus [name], male   DOB: Dec 18, 1928, 80 y.o.   MRN: [id number]  Primary cardiologist: Dr. Acie Fredrickson EP: Dr Lovena Le   80 yo with history of pacemaker for bradycardia, paroxysmal atrial fibrillation, CAD s/p CABG and PCIs, and ischemic cardiomyopathy presents for CHF clinic evaluation.  Over the last few months, patient has had a symptomatic decline.  He seems to be in atrial fibrillation more often (but not persistently) which may contribute to symptoms.  He took amiodarone for about 10 days but felt sick/nauseated after starting it so stopped it. Recent PCM interrogation showed that he is RV pacing 85% of the time. He has had progressive dypsnea.  He is short of breath walking around in his house or with any incline.  No chest pain.  He has orthopnea.  Torsemide has been increased over the last several weeks without much effect other than worsening creatinine.   Admitted 5/19-5/26/15 with volume overload. He was diuresed with milrinone and IV lasix and discharge weight was 153 lbs. He underwent upgrade to his ICD to CRT-D as well. His amiodarone was increased to 200 mg BID.   He returns for follow up. Last visit potassium was stopped and carvedilol was increased to 3.125 mg in and 6.25 mg in pm. Overall he feels much better and has more energy. Denies SOB/PND/Orthopnea.  Limiting R arm movement. Weight at home 152-154 pounds.  Taking all medications. Able to walk 1/4 mile.   Labs (9/14). LDL 77 Labs (4/15): K 3.6, creatinine 1.9 Labs (5/15): K 3.8, creatinine 1.86 Labs 10/28/13 K 5.3 Creatinine 1.86 Potassium stopped    PMH:  1. Symptomatic bradycardia: Medtronic CRT-D 2. HTN 3. Hyperlipidemia 4. Atrial fibrillation: History of prior DCCV.  Paroxysmal.  On Eliquis.  Has history of cardioembolism to right leg with right popliteal and tibial embolectomy (had been off Eliquis). Has had nausea with amiodarone in the past.  5. Prostate cancer 6. CAD: CABG remotely with SVG-OM and  LIMA-LAD.  Late 1990s had PCI to RCA.  In 12/09 had LHC with total occlusion of SVG-OM, patent LIMA-LAD, DES to LCx and DES to RCA.   7. Ischemic cardiomyopathy: Echo (3/15) with EF 20-25%, wall motion abnormalities noted, mild to moderately decreased RV systolic function. ICD upgraded to CRT-D 10/08/13.  8. CKD  SH: Married, nonsmoker, lives in [place]  [place]: CAD  ROS: All systems reviewed and negative except as per HPI.   Current Outpatient Prescriptions  Medication Sig Dispense Refill  . amiodarone (PACERONE) 200 MG tablet Take 1 tablet (200 mg total) by mouth 2 (two) times daily.  60 tablet  3  . apixaban (ELIQUIS) 2.5 MG TABS tablet Take 2.5 mg by mouth 2 (two) times daily.      . Ascorbic Acid (VITAMIN C PO) Take 1 tablet by mouth daily.       Marland Kitchen CALCIUM PO Take 1 tablet by mouth daily.       . carvedilol (COREG) 3.125 MG tablet Take 3.125 mg (1 tablet) in the morning and 6.25 mg (2 tablets) in the evening.  90 tablet  3  . Cholecalciferol (VITAMIN D PO) Take 1 capsule by mouth daily.       . Coenzyme Q10 (COQ10 PO) Take 1-3 tablets by mouth See admin instructions. Take 3 tablets by mouth in the morning, and 1 tablet by mouth in the evening.      . hydrALAZINE (APRESOLINE) 10 MG tablet Take 0.5 tablets (5 mg total) by mouth every 8 (  eight) hours.  45 tablet  3  . isosorbide mononitrate (IMDUR) 30 MG 24 hr tablet Take 0.5 tablets (15 mg total) by mouth daily.  30 tablet  3  . Omega-3 Fatty Acids (OMEGA-3 FISH OIL PO) Take 30 mLs by mouth daily.      Marland Kitchen spironolactone (ALDACTONE) 25 MG tablet Take 0.5 tablets (12.5 mg total) by mouth daily.  15 tablet  3  . torsemide (DEMADEX) 20 MG tablet Take 20 mg by mouth once.      Marland Kitchen VITAMIN E PO Take 1 capsule by mouth daily.        No current facility-administered medications for this encounter.    Filed Vitals:   11/25/13 1424  BP: 112/61  Pulse: 87  Weight: 159 lb (72.122 kg)  SpO2: 100%   General: NAD, wife present Neck: JVP flat no  thyromegaly or thyroid nodule.  Lungs: Clear to auscultation bilaterally with normal respiratory effort. CV: Nondisplaced PMI.  Heart regular S1/S2, 2/6 HSM apex. No edema.  No carotid bruit.  Normal pedal pulses. R upper chest ICD  Abdomen: Soft, nontender, no hepatosplenomegaly, no distention.  Skin: Intact without lesions or rashes.  Neurologic: Alert and oriented x 3.  Psych: Normal affect. Extremities: No clubbing or cyanosis.   EKG: AV paced 60 bpm  Assessment/Plan:  1. Chronic systolic CHF: ICM s/p CRT- NYHA II. - Doing much better after s/p CRT-D. Volume status stable.   -Continue torsemide 20 mg daily and I have instructed instructed him to hold torsemide if his weight is less 152 pounds  And to take an additional 10 mg of torsemide if his weigh tis 158 pounds or greater. Continue 12.5 mg spironolactone.  - Continue coreg to 3.125 mg q am and 6.25 mg q pm.  - Increase hydralazine to 10 mg TID and and continue Imdur 15 mg daily for afterload reduction.  - Reinforced the need and importance of daily weights, a low sodium diet, and fluid restriction (less than 2 L a day). Instructed to call the HF clinic if weight increases more than 3 lbs overnight or 5 lbs in a week.  2. Atrial fibrillation: Paroxysmal. Cut back  amiodarone 200 mg once a day. Once his ICD is ~3 months out will enroll in out HF Optivol program where he will transmit once a month. Continue BB and Eliquis 2.5 mg BID. Needs yearly eye exam  3. CAD: Stable. No s/s of ischemia. Continue medical management. He is not currently on a statin.  4. Sick sinus syndrome: s/p CRT-D. 5. CKD, stage III - baseline creatinine 1.5-1.8. Check BMET today  Follow up in  3 weeks  CLEGG,AMY NP-C  11/25/2013

## 2013-11-25 ENCOUNTER — Ambulatory Visit (HOSPITAL_COMMUNITY)
Admission: RE | Admit: 2013-11-25 | Discharge: 2013-11-25 | Disposition: A | Discharge status: Home / Self Care | Payer: Medicare Other | Source: Ambulatory Visit | Attending: Adult Health | Admitting: Adult Health

## 2013-11-25 ENCOUNTER — Ambulatory Visit (INDEPENDENT_AMBULATORY_CARE_PROVIDER_SITE_OTHER): Payer: Medicare Other | Admitting: *Deleted

## 2013-11-25 VITALS — BP 112/61 | HR 87 | Wt 159.0 lb

## 2013-11-25 DIAGNOSIS — I509 Heart failure, unspecified: Secondary | ICD-10-CM | POA: Insufficient documentation

## 2013-11-25 DIAGNOSIS — Z951 Presence of aortocoronary bypass graft: Secondary | ICD-10-CM | POA: Diagnosis not present

## 2013-11-25 DIAGNOSIS — I495 Sick sinus syndrome: Secondary | ICD-10-CM | POA: Insufficient documentation

## 2013-11-25 DIAGNOSIS — Z79899 Other long term (current) drug therapy: Secondary | ICD-10-CM | POA: Diagnosis not present

## 2013-11-25 DIAGNOSIS — I5023 Acute on chronic systolic (congestive) heart failure: Secondary | ICD-10-CM | POA: Diagnosis not present

## 2013-11-25 DIAGNOSIS — Z950 Presence of cardiac pacemaker: Secondary | ICD-10-CM | POA: Diagnosis not present

## 2013-11-25 DIAGNOSIS — E785 Hyperlipidemia, unspecified: Secondary | ICD-10-CM | POA: Diagnosis not present

## 2013-11-25 DIAGNOSIS — I2589 Other forms of chronic ischemic heart disease: Secondary | ICD-10-CM | POA: Insufficient documentation

## 2013-11-25 DIAGNOSIS — N183 Chronic kidney disease, stage 3 unspecified: Secondary | ICD-10-CM

## 2013-11-25 DIAGNOSIS — I2510 Atherosclerotic heart disease of native coronary artery without angina pectoris: Secondary | ICD-10-CM | POA: Insufficient documentation

## 2013-11-25 DIAGNOSIS — I4891 Unspecified atrial fibrillation: Secondary | ICD-10-CM | POA: Insufficient documentation

## 2013-11-25 DIAGNOSIS — T82837D Hemorrhage of cardiac prosthetic devices, implants and grafts, subsequent encounter: Secondary | ICD-10-CM

## 2013-11-25 DIAGNOSIS — Z8249 Family history of ischemic heart disease and other diseases of the circulatory system: Secondary | ICD-10-CM | POA: Diagnosis not present

## 2013-11-25 DIAGNOSIS — Z5189 Encounter for other specified aftercare: Secondary | ICD-10-CM

## 2013-11-25 DIAGNOSIS — I5022 Chronic systolic (congestive) heart failure: Secondary | ICD-10-CM | POA: Insufficient documentation

## 2013-11-25 DIAGNOSIS — I129 Hypertensive chronic kidney disease with stage 1 through stage 4 chronic kidney disease, or unspecified chronic kidney disease: Secondary | ICD-10-CM | POA: Diagnosis not present

## 2013-11-25 DIAGNOSIS — T82897A Other specified complication of cardiac prosthetic devices, implants and grafts, initial encounter: Secondary | ICD-10-CM

## 2013-11-25 LAB — MDC_IDC_ENUM_SESS_TYPE_INCLINIC

## 2013-11-25 LAB — BASIC METABOLIC PANEL
Anion gap: 17 — ABNORMAL HIGH (ref 5–15)
BUN: 52 mg/dL — AB (ref 6–23)
CO2: 22 meq/L (ref 19–32)
Calcium: 9.3 mg/dL (ref 8.4–10.5)
Chloride: 99 mEq/L (ref 96–112)
Creatinine, Ser: 1.88 mg/dL — ABNORMAL HIGH (ref 0.50–1.35)
GFR calc Af Amer: 37 mL/min — ABNORMAL LOW (ref >90)
GFR calc non Af Amer: 32 mL/min — ABNORMAL LOW (ref >90)
GLUCOSE: 99 mg/dL (ref 70–99)
POTASSIUM: 4.4 meq/L (ref 3.7–5.3)
SODIUM: 138 meq/L (ref 137–147)

## 2013-11-25 LAB — PRO B NATRIURETIC PEPTIDE: Pro B Natriuretic peptide (BNP): 2528 pg/mL — ABNORMAL HIGH (ref 0–450)

## 2013-11-25 MED ORDER — HYDRALAZINE HCL 10 MG PO TABS: 10.0000 mg | ORAL_TABLET | Freq: Three times a day (TID) | ORAL | Status: DC

## 2013-11-25 MED ORDER — AMIODARONE HCL 200 MG PO TABS: 200.0000 mg | ORAL_TABLET | Freq: Every day | ORAL | Status: DC

## 2013-11-25 MED ORDER — TORSEMIDE 20 MG PO TABS: 20.0000 mg | ORAL_TABLET | Freq: Once | ORAL | Status: DC

## 2013-11-25 NOTE — Progress Notes (Signed)
Pocket site much better. Swelling has continued to decrease substantially. No oozing or bleeding present, pt no longer wearing bandages. Pt restarted eliquis last visit. ROV w/ Dr. Lovena Le 01/12/14.

## 2013-11-25 NOTE — Patient Instructions (Addendum)
Follow up in 3 weeks  Continue torsemide 20 mg daily and I have instructed instructed him to hold torsemide if his weight is less 152 pounds  And to take an additional 10 mg of torsemide if his weigh tis 158 pounds or greater.   Take amiodarone 200 mg daily  Take hydralazine 10 mg three times a day   Do the following things EVERYDAY: 1) Weigh yourself in the morning before breakfast. Write it down and keep it in a log. 2) Take your medicines as prescribed 3) Eat low salt foods-Limit salt (sodium) to 2000 mg per day.  4) Stay as active as you can everyday 5) Limit all fluids for the day to less than 2 liters

## 2013-11-26 NOTE — Addendum Note (Signed)
Encounter addended by: Georga Kaufmann, CCT on: 11/26/2013  8:33 AM<BR>     Documentation filed: Charges VN

## 2013-12-10 DIAGNOSIS — H353190 Nonexudative age-related macular degeneration, unspecified eye, stage unspecified: Secondary | ICD-10-CM | POA: Diagnosis not present

## 2013-12-11 DIAGNOSIS — S52599A Other fractures of lower end of unspecified radius, initial encounter for closed fracture: Secondary | ICD-10-CM | POA: Diagnosis not present

## 2013-12-16 ENCOUNTER — Ambulatory Visit (HOSPITAL_COMMUNITY)
Admission: RE | Admit: 2013-12-16 | Discharge: 2013-12-16 | Disposition: A | Discharge status: Home / Self Care | Payer: Medicare Other | Source: Ambulatory Visit | Attending: Cardiology | Admitting: Cardiology

## 2013-12-16 ENCOUNTER — Encounter (HOSPITAL_COMMUNITY): Payer: Self-pay

## 2013-12-16 VITALS — BP 98/60 | HR 78 | Wt 160.1 lb

## 2013-12-16 DIAGNOSIS — I2510 Atherosclerotic heart disease of native coronary artery without angina pectoris: Secondary | ICD-10-CM | POA: Diagnosis not present

## 2013-12-16 DIAGNOSIS — I5023 Acute on chronic systolic (congestive) heart failure: Secondary | ICD-10-CM

## 2013-12-16 DIAGNOSIS — I129 Hypertensive chronic kidney disease with stage 1 through stage 4 chronic kidney disease, or unspecified chronic kidney disease: Secondary | ICD-10-CM | POA: Diagnosis not present

## 2013-12-16 DIAGNOSIS — I509 Heart failure, unspecified: Secondary | ICD-10-CM | POA: Insufficient documentation

## 2013-12-16 DIAGNOSIS — I4891 Unspecified atrial fibrillation: Secondary | ICD-10-CM | POA: Diagnosis not present

## 2013-12-16 DIAGNOSIS — N183 Chronic kidney disease, stage 3 unspecified: Secondary | ICD-10-CM | POA: Insufficient documentation

## 2013-12-16 DIAGNOSIS — I5022 Chronic systolic (congestive) heart failure: Secondary | ICD-10-CM | POA: Diagnosis not present

## 2013-12-16 DIAGNOSIS — I480 Paroxysmal atrial fibrillation: Secondary | ICD-10-CM

## 2013-12-16 MED ORDER — CARVEDILOL 6.25 MG PO TABS: 6.2500 mg | ORAL_TABLET | Freq: Two times a day (BID) | ORAL | Status: DC

## 2013-12-16 NOTE — Patient Instructions (Signed)
INCREASE Coreg to 6.25 mg twice a day  Your physician recommends that you schedule a follow-up appointment in: 2 months with LABS (CBC,BMET,LFT,and TSH)  Do the following things EVERYDAY: 1) Weigh yourself in the morning before breakfast. Write it down and keep it in a log. 2) Take your medicines as prescribed 3) Eat low salt foods-Limit salt (sodium) to 2000 mg per day.  4) Stay as active as you can everyday 5) Limit all fluids for the day to less than 2 liters 6)

## 2013-12-17 NOTE — Progress Notes (Signed)
Patient ID: Marcus [name], male   DOB: 03-05-1929, 80 y.o.   MRN: [id number] Primary cardiologist: Dr. Acie Fredrickson EP: Dr Lovena Le   80 yo with history of pacemaker for bradycardia, paroxysmal atrial fibrillation, CAD s/p CABG and PCIs, and ischemic cardiomyopathy presents for CHF clinic evaluation.  Prior to initial evaluation, patient had had a symptomatic decline.  PCM interrogation showed that he was RV pacing 85% of the time. He had progressive dypsnea.  Admitted 5/19-5/26/15 with volume overload. He was diuresed with milrinone and IV lasix and discharge weight was 153 lbs. He underwent upgrade to his ICD to CRT-D as well.   He has felt much better since CRT upgrade.  He is in NSR and denies tachypalpitations.  He walks 2 miles/day on a treadmill.  He is short of breath only with walking fast.  No chest pain.  No lightheadedness. Weight is stable.   Labs (9/14). LDL 77 Labs (4/15): K 3.6, creatinine 1.9 Labs (5/15): K 3.8, creatinine 1.86 Labs (6/15): K 5.3, creatinine 1.86 Potassium stopped  Labs (7/15): K 4.4, creatinine 1.88   PMH:  1. Symptomatic bradycardia: Medtronic CRT-D 2. HTN 3. Hyperlipidemia 4. Atrial fibrillation: History of prior DCCV.  Paroxysmal.  On Eliquis.  Has history of cardioembolism to right leg with right popliteal and tibial embolectomy (had been off Eliquis). Has had nausea with amiodarone in the past but now tolerating.  5. Prostate cancer 6. CAD: CABG remotely with SVG-OM and LIMA-LAD.  Late 1990s had PCI to RCA.  In 12/09 had LHC with total occlusion of SVG-OM, patent LIMA-LAD, DES to LCx and DES to RCA.   7. Ischemic cardiomyopathy: Echo (3/15) with EF 20-25%, wall motion abnormalities noted, mild to moderately decreased RV systolic function. ICD upgraded to CRT-D 10/08/13.  8. CKD  SH: Married, nonsmoker, lives in [place]  [place]: CAD  ROS: All systems reviewed and negative except as per HPI.   Current Outpatient Prescriptions  Medication Sig Dispense  Refill  . amiodarone (PACERONE) 200 MG tablet Take 1 tablet (200 mg total) by mouth daily.  30 tablet  3  . apixaban (ELIQUIS) 2.5 MG TABS tablet Take 2.5 mg by mouth 2 (two) times daily.      . Ascorbic Acid (VITAMIN C PO) Take 1 tablet by mouth daily.       Marland Kitchen CALCIUM PO Take 1 tablet by mouth daily.       . carvedilol (COREG) 6.25 MG tablet Take 1 tablet (6.25 mg total) by mouth 2 (two) times daily with a meal.  60 tablet  3  . Cholecalciferol (VITAMIN D PO) Take 1 capsule by mouth daily.       . Coenzyme Q10 (COQ10 PO) Take 1-3 tablets by mouth See admin instructions. Take 3 tablets by mouth in the morning, and 1 tablet by mouth in the evening.      . hydrALAZINE (APRESOLINE) 10 MG tablet Take 1 tablet (10 mg total) by mouth every 8 (eight) hours.  90 tablet  3  . isosorbide mononitrate (IMDUR) 30 MG 24 hr tablet Take 0.5 tablets (15 mg total) by mouth daily.  30 tablet  3  . Multiple Vitamins-Minerals (PRESERVISION AREDS 2 PO) Take by mouth 2 (two) times daily.      . Omega-3 Fatty Acids (OMEGA-3 FISH OIL PO) Take 30 mLs by mouth daily.      Marland Kitchen spironolactone (ALDACTONE) 25 MG tablet Take 0.5 tablets (12.5 mg total) by mouth daily.  15 tablet  3  .  torsemide (DEMADEX) 20 MG tablet Take 1 tablet (20 mg total) by mouth once. Hold torsemide if your weight is less than 152 pounds. Take an extra 10 mg of torsemide if your weight is 158 or greater  45 tablet  6  . VITAMIN E PO Take 1 capsule by mouth daily.        No current facility-administered medications for this encounter.    Filed Vitals:   12/16/13 1501  BP: 98/60  Pulse: 78  Weight: 160 lb 1.9 oz (72.63 kg)  SpO2: 98%   General: NAD, wife present Neck: JVP flat no thyromegaly or thyroid nodule.  Lungs: Clear to auscultation bilaterally with normal respiratory effort. CV: Nondisplaced PMI.  Heart regular S1/S2, 1/6 SEM. 1+ ankle edema.  No carotid bruit.  Normal pedal pulses. R upper chest ICD  Abdomen: Soft, nontender, no  hepatosplenomegaly, no distention.  Skin: Intact without lesions or rashes.  Neurologic: Alert and oriented x 3.  Psych: Normal affect. Extremities: No clubbing or cyanosis.   Assessment/Plan:  1. Chronic systolic CHF: Ischemic cardiomyopathy, EF 20-25%.  Doing much better symptomatically after CRT upgrade. Volume status stable with NYHA class II symptoms.   - Continue prn torsemide use based on weight.  - Increase Coreg to 6.25 mg bid.  - Continue current spironolactone and hydralazine/Imdur.  Not on ACEI with CKD. 2. Atrial fibrillation: Paroxysmal. Continue amiodarone 200 mg daily.  Will check TSH/LFTs today and will need yearly eye exam.  He is on Eliquis 2.5 mg bid. 3. CAD: Stable. No chest pain. Continue medical management. He is not currently on a statin (he has declined).  He is not on ASA given use of Eliquis and stable CAD.  4. Sick sinus syndrome: s/p CRT-D. 5. CKD, stage III: Baseline creatinine 1.5-1.8. Check BMET today  Followup in 2 months.   [name] 12/17/2013

## 2013-12-29 ENCOUNTER — Encounter: Payer: Self-pay | Admitting: Cardiovascular Disease

## 2013-12-29 ENCOUNTER — Ambulatory Visit (INDEPENDENT_AMBULATORY_CARE_PROVIDER_SITE_OTHER): Payer: Medicare Other | Admitting: Cardiovascular Disease

## 2013-12-29 VITALS — BP 98/48 | HR 52 | Ht 68.0 in | Wt 163.6 lb

## 2013-12-29 DIAGNOSIS — E785 Hyperlipidemia, unspecified: Secondary | ICD-10-CM

## 2013-12-29 DIAGNOSIS — I2510 Atherosclerotic heart disease of native coronary artery without angina pectoris: Secondary | ICD-10-CM | POA: Diagnosis not present

## 2013-12-29 DIAGNOSIS — I5023 Acute on chronic systolic (congestive) heart failure: Secondary | ICD-10-CM | POA: Diagnosis not present

## 2013-12-29 DIAGNOSIS — I5020 Unspecified systolic (congestive) heart failure: Secondary | ICD-10-CM

## 2013-12-29 NOTE — Assessment & Plan Note (Signed)
He is tolerating the Eliquis quite well.

## 2013-12-29 NOTE — Progress Notes (Signed)
Marcus [name] Date of Birth: 09-29-1928 Medical Record [id number]  Problems: 1. Coronary artery disease- status post CABG and PCI 2. Pacemaker placement 3. History of prostate cancer 4. Ischemic cardiopathy with an ejection fraction of 35-40% 5. Atrial fibrillation  History of Present Illness: Marcus [name] is seen back today for a work in visit . He has an ischemic CM with an EF of 35 to 40% per recent echo, known CAD remote CABG/PCI - managed medically, HLD, prostate cancer, underlying pacemaker due to bradycardia.   He was most recently admitted with CHF and cough, fever. Treated with diuretics and antibiotics. When I saw him back I got his pacemaker checked. He was in atrial flutter and the dates correlated with his recent admission. This may have been the trigger.  I started Eliquis. He was quite unsure as to taking it.   He comes back today. He is here with his wife. Feels "crummy" per the wife. He is tired. Not really short of breath. Cough has almost resolved. No chest pain. No swelling. Weight has been fairly stable at home. No extra lasix. Trying to watch his salt. Not dizzy or lightheaded. He says he is taking his medicines.   Feb. 5, 2014: He has had more "bad" days than "good" days.  He has been having some worsening dyspnea recently.  He denies any PND or orthopnea.  He is short of breath at rest - worsens with any exertion.   These symptoms have been gradually and progressively worsening over the past week or so.  July 22, 2012:   Marcus [name] was admitted with worsening dysnea recent.  He had a cardioversion.   TEE revealed: Left ventricle: Systolic function was mildly to moderately reduced. The estimated ejection fraction was in the range of 40% to 45%. Inferoposterior and lateral akinesis. - Aortic valve: No evidence of vegetation. Trivial regurgitation. - Mitral valve: No evidence of vegetation. Moderate regurgitation. - Left atrium: The atrium was moderately to  severely dilated. No evidence of thrombus in the atrial cavity or appendage. - Atrial septum: No defect or patent foramen ovale was identified. - Tricuspid valve: No evidence of vegetation. - Pulmonic valve: No evidence of vegetation.  His x-ray feeling quite well today. A TEE performed reveals some improvement of his left ventricular systolic function.  His breathing has improved on the Torsemide ( instead of Furosemide).    Sept. 15, 2014,   Marcus [name] is doing well.  No CP or dyspnea.   He has been doing on torsemide and is doing well.  He is due to a lipid profile. He   Does not want to take the  20 mg torsemide every day.  He would like to take take 10 mg a day instead of 20 . He has had mild renal insufficiency in the past and we have not use ACE-I or ARB.  His renal function had improved but now his creatinine is again 1.6.   Dec. 16, 2014:  Marcus [name] is feeling well.  Breathing well.  No CP.    September 16, 2013:  Abdominal was admitted to the hospital approximately one month ago with a pulseless leg. He was found to have a thromboembolus slightly.  He stopped his anticoagulation approximately one month prior to did not have a reason for discontinue it. He did not report any bleeding.   He's been on Eliquis since that time.  He complains of chronic dyspnea.  He thinks that this is due to his atrial fibrillation.  He has seen Dr. Lovena Le who suggested Amiodarone.    He took Amio for 11 days but had lots of GI problems ( nausea, feeling poorly).  He also has had some leg swelling.  He has had some worsening DOE.  Also dyspnea with normal activities - getting dressed, taking a shower,   His demedex dose was decreased several weeks ago because of increasing BUN and creatinine.    Echo in March 2015 showed: Left ventricle: The cavity size was mildly dilated. Wall thickness was normal. Systolic function was severely reduced. The estimated ejection fraction was in the range of 20% to 25%. Akinesis  and scarring of the inferolateral, inferior, and inferoseptal myocardium; in the distribution of the right coronary artery. The study is not technically sufficient to allow evaluation of LV diastolic function. No evidence of thrombus. - Aortic valve: Trivial regurgitation. - Left atrium: The atrium was mildly dilated. - Right ventricle: Systolic function was mildly to moderately reduced. - Right atrium: The atrium was moderately dilated  December 29, 2013: Marcus [name] is doing much better.  He has had a CRT- D planced and could immediately tell that he felt better. Is also been seen in the heart failure clinic. He's able to do all his normal activities and is feeling quite well.  He doesn't have any other concerns today     Current Outpatient Prescriptions on File Prior to Visit  Medication Sig Dispense Refill  . amiodarone (PACERONE) 200 MG tablet Take 1 tablet (200 mg total) by mouth daily.  30 tablet  3  . apixaban (ELIQUIS) 2.5 MG TABS tablet Take 2.5 mg by mouth 2 (two) times daily.      . Ascorbic Acid (VITAMIN C PO) Take 1 tablet by mouth daily.       Marland Kitchen CALCIUM PO Take 1 tablet by mouth daily.       . carvedilol (COREG) 6.25 MG tablet Take 1 tablet (6.25 mg total) by mouth 2 (two) times daily with a meal.  60 tablet  3  . Cholecalciferol (VITAMIN D PO) Take 1 capsule by mouth daily.       . Coenzyme Q10 (COQ10 PO) Take 1-3 tablets by mouth See admin instructions. Take 3 tablets by mouth in the morning, and 1 tablet by mouth in the evening.      . isosorbide mononitrate (IMDUR) 30 MG 24 hr tablet Take 0.5 tablets (15 mg total) by mouth daily.  30 tablet  3  . Omega-3 Fatty Acids (OMEGA-3 FISH OIL PO) Take 30 mLs by mouth daily.      Marland Kitchen spironolactone (ALDACTONE) 25 MG tablet Take 0.5 tablets (12.5 mg total) by mouth daily.  15 tablet  3  . torsemide (DEMADEX) 20 MG tablet Take 1 tablet (20 mg total) by mouth once. Hold torsemide if your weight is less than 152 pounds. Take an extra 10 mg of  torsemide if your weight is 158 or greater  45 tablet  6  . VITAMIN E PO Take 1 capsule by mouth daily.        No current facility-administered medications on file prior to visit.    No Known Allergies  Past Medical History  Diagnosis Date  . Coronary artery disease     a. s/p remote CABG x 2(VG->OM, LIMA->LAD;  b. Late 90's s/p PCI of RCA;  c. 12/09 Cath/PCI: LM 39m, 70-80d (3.0x67mm Xience DES), LAD100p, LCX 80-90p (2.25x21mmTaxus Atom DES), RCA 100d (2.5x73mm Xience DES), VG->OM 100, LIMA->LAD nl, EF 30-35%.  Marland Kitchen  Hyperlipidemia   . Prostate cancer   . PAF (paroxysmal atrial fibrillation)   . Ischemic cardiomyopathy     a. EF 35-40% by echo 05/2012, b. EF 20-25%, akinesis and scarring of inferolateral, inferior and inferoseptal myocardium, mild AI, mod MR, LA mod dilated, RV mildly dialted and sys fx mildly reduced, RA mildly dilated (09/2013)   . Symptomatic bradycardia     a. 08/2003 s/p MDT Enpulse E2DR01 Dual chamber PPM ser # [id number] H.  . Atrial fibrillation   . CHF (congestive heart failure)     a. CRT-D (10/08/2013)  . Peripheral vascular disease     Past Surgical History  Procedure Laterality Date  . Insert / replace / remove pacemaker    . Doppler echocardiography  06/24/2002    EF 60-65%  . Tee without cardioversion  06/26/2012    Procedure: TRANSESOPHAGEAL ECHOCARDIOGRAM (TEE);  Surgeon: Lelon Perla, MD;  Location[facility] END[facility]COPY;  Service: Cardiovascular;  Laterality: [name];  . Cardioversion  06/26/2012    Procedure: CARDIOVERSION;  Surgeon: Lelon Perla, MD;  Location: [place];  Service: Cardiovascular;  Laterality: [name];  . Appendectomy    . Angioplasty      stent placement  . Embolectomy Right 07/28/2013    Procedure: Right Popliteal and Tibial Embolectomy;  Surgeon: Angelia Mould, MD;  Location: [facility] OR;  Service: Vascular;  Laterality: Right;  . Coronary artery bypass graft  1990    History  Smoking status  . Former Smoker  . Quit date: 02/10/1971   Smokeless tobacco  . Never Used    History  Alcohol Use No    Family History  Problem Relation Age of Onset  . Hypertension Mother   . Colon cancer Mother   . Cancer Mother     Colon  . Alcohol abuse Father     Review of Systems: The review of systems is per the HPI.  All other systems were reviewed and are negative.  Physical Exam: BP 98/48  Pulse 52  Ht 5\' 8"  (1.727 m)  Wt 163 lb 9.6 oz (74.208 kg)  BMI 24.88 kg/m2 Patient is alert, looks chronically ill but in no acute distress. Skin is warm and dry. Color is normal.   HEENT is unremarkable. Normocephalic/atraumatic. PERRL. Sclera are nonicteric. Neck is supple. No masses. No JVD.  Lungs are clear.  Cardiac exam shows a regular rhythm.  He has occasional premature beats. Rate is controlled. Abdomen is soft.  Extremities:  Trace- 1+  edema in right leg  Gait and ROM are intact. No gross neurologic deficits noted.   LABORATORY DATA: Lab Results  Component Value Date   WBC 6.8 10/10/2013   HGB 9.9* 10/10/2013   HCT 30.3* 10/10/2013   PLT 174 10/10/2013   GLUCOSE 99 11/25/2013   CHOL 160 02/02/2013   TRIG 56.0 02/02/2013   HDL 72.20 02/02/2013   LDLCALC 77 02/02/2013   ALT 17 10/06/2013   AST 23 10/06/2013   NA 138 11/25/2013   K 4.4 11/25/2013   CL 99 11/25/2013   CREATININE 1.88* 11/25/2013   BUN 52* 11/25/2013   CO2 22 11/25/2013   TSH 2.730 10/06/2013   INR 1.47 06/28/2012   EKG: Dec. 16, 2014:   AV pacing at 82.    Assessment / Plan:

## 2013-12-29 NOTE — Patient Instructions (Addendum)
Your physician recommends that you continue on your current medications as directed. Please refer to the Current Medication list given to you today.  Your physician has requested that you have an echocardiogram 1 week before your office visit with Dr. Acie Fredrickson.  Echocardiography is a painless test that uses sound waves to create images of your heart. It provides your doctor with information about the size and shape of your heart and how well your heart's chambers and valves are working. This procedure takes approximately one hour. There are no restrictions for this procedure.  Your physician wants you to follow-up in: 6 months with Dr. Acie Fredrickson 1 week after your echocardiogram.  You will receive a reminder letter in the mail two months in advance. If you don't receive a letter, please call our office to schedule the follow-up appointment.  Your physician recommends that you return for lab work in: 6 months on the day of your ECHOCARDIOGRAM.  You will need to FAST for this appointment - nothing to eat or drink after midnight the night before except water.

## 2013-12-29 NOTE — Assessment & Plan Note (Signed)
Marcus [name]  is doing much better since he had a CRT place. He's able to do all his normal activities without any significant problems. He's tolerating his medicines below. His blood pressure is a marginal sizable not it was up titrating his medications.  We will get an echocardiogram in 6 months. I'll see him again one week following that. We'll do fasting labs at that office visit.

## 2013-12-29 NOTE — Assessment & Plan Note (Deleted)
Jenny Reichmann is doing much better since he had a CRT place. He's able to do all his normal activities without any significant problems. He's tolerating his medicines below. His blood pressure is a marginal sizable not it was up titrating his medications.  We will get an echocardiogram in 6 months. I'll see him again one week following that. We'll do fasting labs at that office visit.

## 2014-01-01 DIAGNOSIS — S52599A Other fractures of lower end of unspecified radius, initial encounter for closed fracture: Secondary | ICD-10-CM | POA: Diagnosis not present

## 2014-01-05 ENCOUNTER — Other Ambulatory Visit (HOSPITAL_COMMUNITY): Payer: Medicare Other

## 2014-01-12 ENCOUNTER — Encounter: Payer: Self-pay | Admitting: Internal Medicine

## 2014-01-12 ENCOUNTER — Ambulatory Visit (INDEPENDENT_AMBULATORY_CARE_PROVIDER_SITE_OTHER): Payer: Medicare Other | Admitting: Internal Medicine

## 2014-01-12 VITALS — BP 119/63 | HR 63 | Ht 68.0 in | Wt 162.0 lb

## 2014-01-12 DIAGNOSIS — I509 Heart failure, unspecified: Secondary | ICD-10-CM

## 2014-01-12 DIAGNOSIS — I2510 Atherosclerotic heart disease of native coronary artery without angina pectoris: Secondary | ICD-10-CM

## 2014-01-12 DIAGNOSIS — I4891 Unspecified atrial fibrillation: Secondary | ICD-10-CM | POA: Diagnosis not present

## 2014-01-12 DIAGNOSIS — I2589 Other forms of chronic ischemic heart disease: Secondary | ICD-10-CM | POA: Diagnosis not present

## 2014-01-12 DIAGNOSIS — I5022 Chronic systolic (congestive) heart failure: Secondary | ICD-10-CM | POA: Diagnosis not present

## 2014-01-12 DIAGNOSIS — Z950 Presence of cardiac pacemaker: Secondary | ICD-10-CM

## 2014-01-12 DIAGNOSIS — I255 Ischemic cardiomyopathy: Secondary | ICD-10-CM

## 2014-01-12 DIAGNOSIS — I5023 Acute on chronic systolic (congestive) heart failure: Secondary | ICD-10-CM | POA: Diagnosis not present

## 2014-01-12 DIAGNOSIS — I480 Paroxysmal atrial fibrillation: Secondary | ICD-10-CM

## 2014-01-12 LAB — MDC_IDC_ENUM_SESS_TYPE_INCLINIC
Battery Voltage: 3.04 V
Brady Statistic AP VS Percent: 0.03 %
Brady Statistic AS VS Percent: 0 %
Brady Statistic RA Percent Paced: 99.99 %
Brady Statistic RV Percent Paced: 99.97 %
Date Time Interrogation Session: 20150825121454
Lead Channel Impedance Value: 361 Ohm
Lead Channel Impedance Value: 361 Ohm
Lead Channel Impedance Value: 418 Ohm
Lead Channel Impedance Value: 684 Ohm
Lead Channel Impedance Value: 798 Ohm
Lead Channel Pacing Threshold Amplitude: 0.875 V
Lead Channel Pacing Threshold Pulse Width: 0.4 ms
Lead Channel Pacing Threshold Pulse Width: 0.4 ms
Lead Channel Setting Pacing Amplitude: 2 V
Lead Channel Setting Pacing Amplitude: 2 V
Lead Channel Setting Pacing Amplitude: 2.5 V
Lead Channel Setting Pacing Pulse Width: 0.4 ms
Lead Channel Setting Sensing Sensitivity: 0.9 mV
MDC IDC MSMT BATTERY REMAINING LONGEVITY: 90 mo
MDC IDC MSMT LEADCHNL LV IMPEDANCE VALUE: 361 Ohm
MDC IDC MSMT LEADCHNL LV IMPEDANCE VALUE: 627 Ohm
MDC IDC MSMT LEADCHNL LV PACING THRESHOLD AMPLITUDE: 0.75 V
MDC IDC MSMT LEADCHNL RA IMPEDANCE VALUE: 304 Ohm
MDC IDC MSMT LEADCHNL RA PACING THRESHOLD AMPLITUDE: 0.75 V
MDC IDC MSMT LEADCHNL RA PACING THRESHOLD PULSEWIDTH: 0.4 ms
MDC IDC MSMT LEADCHNL RV IMPEDANCE VALUE: 342 Ohm
MDC IDC SET LEADCHNL RV PACING PULSEWIDTH: 0.4 ms
MDC IDC STAT BRADY AP VP PERCENT: 99.96 %
MDC IDC STAT BRADY AS VP PERCENT: 0.01 %
Zone Setting Detection Interval: 350 ms
Zone Setting Detection Interval: 400 ms

## 2014-01-12 MED ORDER — AMIODARONE HCL 200 MG PO TABS: ORAL_TABLET | ORAL | Status: DC

## 2014-01-12 NOTE — Assessment & Plan Note (Signed)
His Medtronic biventricular pacemaker is working normally. We'll plan to recheck in several months. 

## 2014-01-12 NOTE — Assessment & Plan Note (Signed)
His chronic systolic heart failure is improved after biventricular pacing and maintenance of sinus rhythm. No change in medications for heart failure at this time.

## 2014-01-12 NOTE — Assessment & Plan Note (Signed)
He denies anginal symptoms. He remains very active. No change in medications.

## 2014-01-12 NOTE — Patient Instructions (Addendum)
Your physician wants you to follow-up in: 9 months with Dr Lovena Le. You will receive a reminder letter in the mail two months in advance. If you don't receive a letter, please call our office to schedule the follow-up appointment.  Remote monitoring is used to monitor your Pacemaker or ICD from home. This monitoring reduces the number of office visits required to check your device to one time per year. It allows [place] to keep an eye on the functioning of your device to ensure it is working properly. You are scheduled for a device check from home on 04/19/14. You may send your transmission at any time that day. If you have a wireless device, the transmission will be sent automatically. After your physician reviews your transmission, you will receive a postcard with your next transmission date.  Your physician has recommended you make the following change in your medication:  1) Decrease Amiodarone to 1 tablet by mouth Mon-Thurs and 1/2 tablet Fri-Sun

## 2014-01-12 NOTE — Assessment & Plan Note (Signed)
He appears to be maintaining sinus rhythm. Because he has had some dizziness, which may be related to amiodarone, I've asked the patient to reduce his dose of amiodarone from 200 mg daily to 200 mg Monday through Thursday, and 100 mg Friday through Sunday.

## 2014-01-12 NOTE — Progress Notes (Signed)
HPI [name] returns today for ongoing followup. He underwent biventricular pacemaker insertion several months ago, with procedure complicated by pocket hematoma. This has ultimately resolved. He has had problems with balance and dizziness, although his activity level has been I. He works in the yard, and exercises daily. He has had no syncope. No fevers or chills. No Known Allergies   Current Outpatient Prescriptions  Medication Sig Dispense Refill  . amiodarone (PACERONE) 200 MG tablet Take 1 tablet (200 mg total) by mouth daily.  30 tablet  3  . apixaban (ELIQUIS) 2.5 MG TABS tablet Take 2.5 mg by mouth 2 (two) times daily.      . Ascorbic Acid (VITAMIN C PO) Take 1 tablet by mouth daily.       Marland Kitchen CALCIUM PO Take 1 tablet by mouth daily.       . carvedilol (COREG) 6.25 MG tablet Take 1 tablet (6.25 mg total) by mouth 2 (two) times daily with a meal.  60 tablet  3  . Cholecalciferol (VITAMIN D PO) Take 1 capsule by mouth daily.       . Coenzyme Q10 (COQ10 PO) Take 1-3 tablets by mouth See admin instructions. Take 3 tablets by mouth in the morning, and 1 tablet by mouth in the evening.      . isosorbide mononitrate (IMDUR) 30 MG 24 hr tablet Take 0.5 tablets (15 mg total) by mouth daily.  30 tablet  3  . Multiple Vitamins-Minerals (PRESERVISION AREDS 2) CAPS Take by mouth.      . Omega-3 Fatty Acids (OMEGA-3 FISH OIL PO) Take 30 mLs by mouth daily.      Marland Kitchen spironolactone (ALDACTONE) 25 MG tablet Take 0.5 tablets (12.5 mg total) by mouth daily.  15 tablet  3  . torsemide (DEMADEX) 20 MG tablet Take 1 tablet (20 mg total) by mouth once. Hold torsemide if your weight is less than 152 pounds. Take an extra 10 mg of torsemide if your weight is 158 or greater  45 tablet  6  . VITAMIN E PO Take 1 capsule by mouth daily.        No current facility-administered medications for this visit.     Past Medical History  Diagnosis Date  . Coronary artery disease     a. s/p remote CABG x  2(VG->OM, LIMA->LAD;  b. Late 90's s/p PCI of RCA;  c. 12/09 Cath/PCI: LM 88m, 70-80d (3.0x29mm Xience DES), LAD100p, LCX 80-90p (2.25x50mmTaxus Atom DES), RCA 100d (2.5x61mm Xience DES), VG->OM 100, LIMA->LAD nl, EF 30-35%.  . Hyperlipidemia   . Prostate cancer   . PAF (paroxysmal atrial fibrillation)   . Ischemic cardiomyopathy     a. EF 35-40% by echo 05/2012, b. EF 20-25%, akinesis and scarring of inferolateral, inferior and inferoseptal myocardium, mild AI, mod MR, LA mod dilated, RV mildly dialted and sys fx mildly reduced, RA mildly dilated (09/2013)   . Symptomatic bradycardia     a. 08/2003 s/p MDT Enpulse E2DR01 Dual chamber PPM ser # [id number] H.  . Atrial fibrillation   . CHF (congestive heart failure)     a. CRT-D (10/08/2013)  . Peripheral vascular disease     ROS:   All systems reviewed and negative except as noted in the HPI.   Past Surgical History  Procedure Laterality Date  . Insert / replace / remove pacemaker    . Doppler echocardiography  06/24/2002    EF 60-65%  . Tee without cardioversion  06/26/2012  Procedure: TRANSESOPHAGEAL ECHOCARDIOGRAM (TEE);  Surgeon: Lelon Perla, MD;  Location[facility] ENDOSCOPY;  Service: Cardiovascular;  Laterality: [name];  . Cardioversion  06/26/2012    Procedure: CARDIOVERSION;  Surgeon: Lelon Perla, MD;  Location: [place];  Service: Cardiovascular;  Laterality: [name];  . Appendectomy    . Angioplasty      stent placement  . Embolectomy Right 07/28/2013    Procedure: Right Popliteal and Tibial Embolectomy;  Surgeon: Angelia Mould, MD;  Location: [facility] OR;  Service: Vascular;  Laterality: Right;  . Coronary artery bypass graft  1990     Family History  Problem Relation Age of Onset  . Hypertension Mother   . Colon cancer Mother   . Cancer Mother     Colon  . Alcohol abuse Father      History   Social History  . Marital Status: Married    Spouse Name: [name]    Number of Children: 0  . Years of Education: [name]    Occupational History  . retired    Social History Main Topics  . Smoking status: Former Smoker    Quit date: 02/10/1971  . Smokeless tobacco: Never Used  . Alcohol Use: No  . Drug Use: No  . Sexual Activity: No   Other Topics Concern  . Not on file   Social History Narrative   Lives in [place] with wife.  Retired.     BP 119/63  Pulse 63  Ht 5\' 8"  (1.727 m)  Wt 162 lb (73.483 kg)  BMI 24.64 kg/m2  Physical Exam:  Well appearing 80 year old man, NAD HEENT: Unremarkable Neck:  No JVD, no thyromegally Back:  No CVA tenderness Lungs:  Clear with no wheezes, rales, or rhonchi.. Pocket hematoma has resolved. HEART:  Regular rate rhythm, no murmurs, no rubs, no clicks Abd:  soft, positive bowel sounds, no organomegally, no rebound, no guarding Ext:  2 plus pulses, no edema, no cyanosis, no clubbing Skin:  No rashes no nodules Neuro:  CN II through XII intact, motor grossly intact    Assess/Plan:

## 2014-01-19 ENCOUNTER — Encounter: Payer: Self-pay | Admitting: Internal Medicine

## 2014-01-29 DIAGNOSIS — S52599A Other fractures of lower end of unspecified radius, initial encounter for closed fracture: Secondary | ICD-10-CM | POA: Diagnosis not present

## 2014-02-01 ENCOUNTER — Other Ambulatory Visit: Payer: Self-pay | Admitting: Dermatology

## 2014-02-01 DIAGNOSIS — C44711 Basal cell carcinoma of skin of unspecified lower limb, including hip: Secondary | ICD-10-CM | POA: Diagnosis not present

## 2014-02-01 DIAGNOSIS — C44519 Basal cell carcinoma of skin of other part of trunk: Secondary | ICD-10-CM | POA: Diagnosis not present

## 2014-02-01 DIAGNOSIS — C44611 Basal cell carcinoma of skin of unspecified upper limb, including shoulder: Secondary | ICD-10-CM | POA: Diagnosis not present

## 2014-02-01 DIAGNOSIS — L821 Other seborrheic keratosis: Secondary | ICD-10-CM | POA: Diagnosis not present

## 2014-02-01 DIAGNOSIS — L570 Actinic keratosis: Secondary | ICD-10-CM | POA: Diagnosis not present

## 2014-02-01 DIAGNOSIS — L819 Disorder of pigmentation, unspecified: Secondary | ICD-10-CM | POA: Diagnosis not present

## 2014-02-01 DIAGNOSIS — C44211 Basal cell carcinoma of skin of unspecified ear and external auricular canal: Secondary | ICD-10-CM | POA: Diagnosis not present

## 2014-02-01 DIAGNOSIS — Z85828 Personal history of other malignant neoplasm of skin: Secondary | ICD-10-CM | POA: Diagnosis not present

## 2014-02-01 DIAGNOSIS — C44621 Squamous cell carcinoma of skin of unspecified upper limb, including shoulder: Secondary | ICD-10-CM | POA: Diagnosis not present

## 2014-02-07 ENCOUNTER — Other Ambulatory Visit (HOSPITAL_COMMUNITY): Payer: Self-pay | Admitting: Anesthesiology

## 2014-02-08 ENCOUNTER — Other Ambulatory Visit: Payer: Self-pay

## 2014-02-08 ENCOUNTER — Other Ambulatory Visit (HOSPITAL_COMMUNITY): Payer: Self-pay

## 2014-02-08 DIAGNOSIS — I480 Paroxysmal atrial fibrillation: Secondary | ICD-10-CM

## 2014-02-08 DIAGNOSIS — I5023 Acute on chronic systolic (congestive) heart failure: Secondary | ICD-10-CM

## 2014-02-08 MED ORDER — APIXABAN 2.5 MG PO TABS: 2.5000 mg | ORAL_TABLET | Freq: Two times a day (BID) | ORAL | Status: DC

## 2014-02-08 MED ORDER — TORSEMIDE 20 MG PO TABS: ORAL_TABLET | ORAL | Status: DC

## 2014-02-08 MED ORDER — AMIODARONE HCL 200 MG PO TABS: ORAL_TABLET | ORAL | Status: DC

## 2014-02-08 MED ORDER — SPIRONOLACTONE 25 MG PO TABS: ORAL_TABLET | ORAL | Status: DC

## 2014-02-08 MED ORDER — CARVEDILOL 6.25 MG PO TABS: 6.2500 mg | ORAL_TABLET | Freq: Two times a day (BID) | ORAL | Status: DC

## 2014-02-08 MED ORDER — ISOSORBIDE MONONITRATE ER 30 MG PO TB24: 15.0000 mg | ORAL_TABLET | Freq: Every day | ORAL | Status: DC

## 2014-02-10 ENCOUNTER — Ambulatory Visit (HOSPITAL_COMMUNITY)
Admission: RE | Admit: 2014-02-10 | Discharge: 2014-02-10 | Disposition: A | Discharge status: Home / Self Care | Payer: Medicare Other | Source: Ambulatory Visit | Attending: Internal Medicine | Admitting: Internal Medicine

## 2014-02-10 VITALS — BP 114/68 | HR 84 | Wt 163.0 lb

## 2014-02-10 DIAGNOSIS — I4892 Unspecified atrial flutter: Secondary | ICD-10-CM

## 2014-02-10 DIAGNOSIS — Z951 Presence of aortocoronary bypass graft: Secondary | ICD-10-CM | POA: Insufficient documentation

## 2014-02-10 DIAGNOSIS — I2510 Atherosclerotic heart disease of native coronary artery without angina pectoris: Secondary | ICD-10-CM | POA: Diagnosis not present

## 2014-02-10 DIAGNOSIS — Z8546 Personal history of malignant neoplasm of prostate: Secondary | ICD-10-CM | POA: Diagnosis not present

## 2014-02-10 DIAGNOSIS — I2589 Other forms of chronic ischemic heart disease: Secondary | ICD-10-CM | POA: Insufficient documentation

## 2014-02-10 DIAGNOSIS — E785 Hyperlipidemia, unspecified: Secondary | ICD-10-CM | POA: Insufficient documentation

## 2014-02-10 DIAGNOSIS — I129 Hypertensive chronic kidney disease with stage 1 through stage 4 chronic kidney disease, or unspecified chronic kidney disease: Secondary | ICD-10-CM | POA: Insufficient documentation

## 2014-02-10 DIAGNOSIS — Z79899 Other long term (current) drug therapy: Secondary | ICD-10-CM | POA: Insufficient documentation

## 2014-02-10 DIAGNOSIS — I5022 Chronic systolic (congestive) heart failure: Secondary | ICD-10-CM | POA: Diagnosis not present

## 2014-02-10 DIAGNOSIS — Z8249 Family history of ischemic heart disease and other diseases of the circulatory system: Secondary | ICD-10-CM | POA: Diagnosis not present

## 2014-02-10 DIAGNOSIS — N183 Chronic kidney disease, stage 3 unspecified: Secondary | ICD-10-CM | POA: Diagnosis not present

## 2014-02-10 DIAGNOSIS — I4891 Unspecified atrial fibrillation: Secondary | ICD-10-CM | POA: Diagnosis not present

## 2014-02-10 DIAGNOSIS — Z950 Presence of cardiac pacemaker: Secondary | ICD-10-CM | POA: Insufficient documentation

## 2014-02-10 DIAGNOSIS — Z9861 Coronary angioplasty status: Secondary | ICD-10-CM | POA: Diagnosis not present

## 2014-02-10 DIAGNOSIS — I509 Heart failure, unspecified: Secondary | ICD-10-CM | POA: Insufficient documentation

## 2014-02-10 DIAGNOSIS — I495 Sick sinus syndrome: Secondary | ICD-10-CM | POA: Insufficient documentation

## 2014-02-10 DIAGNOSIS — Z7901 Long term (current) use of anticoagulants: Secondary | ICD-10-CM | POA: Diagnosis not present

## 2014-02-10 DIAGNOSIS — I5023 Acute on chronic systolic (congestive) heart failure: Secondary | ICD-10-CM

## 2014-02-10 LAB — BASIC METABOLIC PANEL
ANION GAP: 12 (ref 5–15)
BUN: 50 mg/dL — AB (ref 6–23)
CALCIUM: 9.3 mg/dL (ref 8.4–10.5)
CO2: 29 mEq/L (ref 19–32)
CREATININE: 2 mg/dL — AB (ref 0.50–1.35)
Chloride: 99 mEq/L (ref 96–112)
GFR, EST AFRICAN AMERICAN: 34 mL/min — AB (ref >90)
GFR, EST NON AFRICAN AMERICAN: 29 mL/min — AB (ref >90)
Glucose, Bld: 94 mg/dL (ref 70–99)
Potassium: 4.5 mEq/L (ref 3.7–5.3)
Sodium: 140 mEq/L (ref 137–147)

## 2014-02-10 MED ORDER — CARVEDILOL 6.25 MG PO TABS: 9.3750 mg | ORAL_TABLET | Freq: Two times a day (BID) | ORAL | Status: DC

## 2014-02-10 NOTE — Patient Instructions (Addendum)
Increase Carvedilol to 9.375 mg ( 1 & 1/2 tabs) Twice daily   Lab today  Your physician recommends that you schedule a follow-up appointment in: 3 months with echo

## 2014-02-10 NOTE — Progress Notes (Signed)
Patient ID: Marcus [name], male   DOB: 03-02-1929, 80 y.o.   MRN: [id number]  Primary cardiologist: Dr. Acie Fredrickson EP: Dr Clois Dupes is an 80 yo with history of pacemaker for bradycardia, paroxysmal atrial fibrillation, CAD s/p CABG and PCIs, and ischemic cardiomyopathy EF 20-25%. Underwent CRT-D upgrade 10/08/13 due to 85% RV pacing.      Admitted 5/19-5/26/15 with volume overload. He was diuresed with milrinone and IV lasix and discharge weight was 153 lbs. He underwent upgrade to his ICD to CRT-D as well.   He has felt much better since CRT upgrade.  Last visit carvedilol increased to 6.25 bid. He is in NSR and denies tachypalpitations.  He was walking 2 miles/day on a treadmill but now focusing on yard work. Working hard. Digging and removing shrubs. No chest pain or edema. .  No lightheadedness. Weight is stable at 158-159. Taking torsemide 30mg /day.   Labs (9/14). LDL 77 Labs (4/15): K 3.6, creatinine 1.9 Labs (5/15): K 3.8, creatinine 1.86 Labs (6/15): K 5.3, creatinine 1.86 Potassium stopped  Labs (7/15): K 4.4, creatinine 1.88   PMH:  1. Symptomatic bradycardia: [employer] CRT-D 2. HTN 3. Hyperlipidemia 4. Atrial fibrillation: History of prior DCCV.  Paroxysmal.  On Eliquis.  Has history of cardioembolism to right leg with right popliteal and tibial embolectomy (had been off Eliquis). Has had nausea with amiodarone in the past but now tolerating.  5. Prostate cancer 6. CAD: CABG remotely with SVG-OM and LIMA-LAD.  Late 1990s had PCI to RCA.  In 12/09 had LHC with total occlusion of SVG-OM, patent LIMA-LAD, DES to LCx and DES to RCA.   7. Ischemic cardiomyopathy: Echo (3/15) with EF 20-25%, wall motion abnormalities noted, mild to moderately decreased RV systolic function. ICD upgraded to CRT-D 10/08/13.  8. CKD  SH: Married, nonsmoker, lives in [place]  [place]: CAD  ROS: All systems reviewed and negative except as per HPI.   Current Outpatient Prescriptions  Medication Sig  Dispense Refill  . amiodarone (PACERONE) 200 MG tablet Take 1 tablet my mouth Mon-Thurs, and 1/2 tablet by mouth Fri-Sun.  90 tablet  3  . apixaban (ELIQUIS) 2.5 MG TABS tablet Take 1 tablet (2.5 mg total) by mouth 2 (two) times daily.  60 tablet  3  . Ascorbic Acid (VITAMIN C PO) Take 1 tablet by mouth daily.       Marland Kitchen CALCIUM PO Take 1 tablet by mouth daily.       . carvedilol (COREG) 6.25 MG tablet Take 1 tablet (6.25 mg total) by mouth 2 (two) times daily with a meal.  60 tablet  3  . Cholecalciferol (VITAMIN D PO) Take 1 capsule by mouth daily.       . Coenzyme Q10 (COQ10 PO) Take 1-3 tablets by mouth See admin instructions. Take 3 tablets by mouth in the morning, and 1 tablet by mouth in the evening.      . isosorbide mononitrate (IMDUR) 30 MG 24 hr tablet Take 0.5 tablets (15 mg total) by mouth daily.  45 tablet  3  . Multiple Vitamins-Minerals (PRESERVISION AREDS 2) CAPS Take by mouth.      . Omega-3 Fatty Acids (OMEGA-3 FISH OIL PO) Take 30 mLs by mouth daily.      Marland Kitchen spironolactone (ALDACTONE) 25 MG tablet TAKE ONE-HALF TABLET BY MOUTH ONCE DAILY  45 tablet  3  . torsemide (DEMADEX) 20 MG tablet Take 1 tablet by mouth daily,Hold torsemide if your weight is under152 lb. Take  an extra10 mg of torsemide if you weight is 158 or higher  45 tablet  3  . VITAMIN E PO Take 1 capsule by mouth daily.        No current facility-administered medications for this encounter.    Filed Vitals:   02/10/14 1410  BP: 114/68  Pulse: 84  Weight: 163 lb (73.936 kg)  SpO2: 97%   General: NAD, wife present Neck: JVP flat no thyromegaly or thyroid nodule.  Lungs: Clear to auscultation bilaterally with normal respiratory effort. CV: Nondisplaced PMI.  Heart regular S1/S2, 1/6 SEM. 1trace ankle edema.  No carotid bruit.  Normal pedal pulses. R upper chest ICD  Abdomen: Soft, nontender, no hepatosplenomegaly, no distention.  Skin: Intact without lesions or rashes.  Neurologic: Alert and oriented x 3.   Psych: Normal affect. Extremities: No clubbing or cyanosis.   ECG: AV paced 60  Assessment/Plan:  1. Chronic systolic CHF: Ischemic cardiomyopathy, EF 20-25%.  Doing much better symptomatically after CRT upgrade. Volume status stable with NYHA class II symptoms.   - Continue prn torsemide use based on weight.  - Increase Coreg to 9.375 mg bid.  - Continue current spironolactone and hydralazine/Imdur.  Not on ACEI with CKD. 2. Atrial fibrillation: Paroxysmal. IN NSR on ECG today. Continue amiodarone 200 mg daily.  Recent TSH/LFTs ok. Will need yearly eye exam.  He is on Eliquis 2.5 mg bid. 3. CAD: Stable. No chest pain. Continue medical management. He is not currently on a statin (he has declined).  He is not on ASA given use of Eliquis and stable CAD.  4. Sick sinus syndrome: s/p CRT-D. 5. CKD, stage III: Baseline creatinine 1.5-1.8. Check BMET today  Followup in 2 months.   Glori Bickers MD 02/10/2014

## 2014-02-11 NOTE — Addendum Note (Signed)
Encounter addended by: Vanessa Barbara, CCT on: 02/11/2014  7:54 AM<BR>     Documentation filed: Charges VN

## 2014-02-11 NOTE — Addendum Note (Signed)
Encounter addended by: Vanessa Barbara, CCT on: 02/11/2014  7:49 AM<BR>     Documentation filed: Charges VN

## 2014-02-12 ENCOUNTER — Other Ambulatory Visit (HOSPITAL_COMMUNITY): Payer: Self-pay

## 2014-02-23 ENCOUNTER — Ambulatory Visit (HOSPITAL_COMMUNITY)
Admission: RE | Admit: 2014-02-23 | Discharge: 2014-02-23 | Disposition: A | Discharge status: Home / Self Care | Payer: Medicare Other | Source: Ambulatory Visit | Attending: Internal Medicine | Admitting: Internal Medicine

## 2014-02-23 ENCOUNTER — Other Ambulatory Visit: Payer: Self-pay | Admitting: Dermatology

## 2014-02-23 DIAGNOSIS — I5022 Chronic systolic (congestive) heart failure: Secondary | ICD-10-CM | POA: Insufficient documentation

## 2014-02-23 DIAGNOSIS — C44622 Squamous cell carcinoma of skin of right upper limb, including shoulder: Secondary | ICD-10-CM | POA: Diagnosis not present

## 2014-02-23 DIAGNOSIS — Z85828 Personal history of other malignant neoplasm of skin: Secondary | ICD-10-CM | POA: Diagnosis not present

## 2014-02-23 DIAGNOSIS — C44519 Basal cell carcinoma of skin of other part of trunk: Secondary | ICD-10-CM | POA: Diagnosis not present

## 2014-02-23 LAB — BASIC METABOLIC PANEL
ANION GAP: 12 (ref 5–15)
BUN: 49 mg/dL — AB (ref 6–23)
CALCIUM: 9.5 mg/dL (ref 8.4–10.5)
CO2: 24 mEq/L (ref 19–32)
CREATININE: 2.18 mg/dL — AB (ref 0.50–1.35)
Chloride: 102 mEq/L (ref 96–112)
GFR, EST AFRICAN AMERICAN: 30 mL/min — AB (ref >90)
GFR, EST NON AFRICAN AMERICAN: 26 mL/min — AB (ref >90)
Glucose, Bld: 90 mg/dL (ref 70–99)
Potassium: 4 mEq/L (ref 3.7–5.3)
Sodium: 138 mEq/L (ref 137–147)

## 2014-03-05 DIAGNOSIS — L0889 Other specified local infections of the skin and subcutaneous tissue: Secondary | ICD-10-CM | POA: Diagnosis not present

## 2014-03-17 DIAGNOSIS — C44222 Squamous cell carcinoma of skin of right ear and external auricular canal: Secondary | ICD-10-CM | POA: Diagnosis not present

## 2014-03-17 DIAGNOSIS — Z85828 Personal history of other malignant neoplasm of skin: Secondary | ICD-10-CM | POA: Diagnosis not present

## 2014-03-24 DIAGNOSIS — C44712 Basal cell carcinoma of skin of right lower limb, including hip: Secondary | ICD-10-CM | POA: Diagnosis not present

## 2014-03-24 DIAGNOSIS — R05 Cough: Secondary | ICD-10-CM | POA: Diagnosis not present

## 2014-03-24 DIAGNOSIS — R0602 Shortness of breath: Secondary | ICD-10-CM | POA: Diagnosis not present

## 2014-03-24 DIAGNOSIS — Z85828 Personal history of other malignant neoplasm of skin: Secondary | ICD-10-CM | POA: Diagnosis not present

## 2014-04-05 ENCOUNTER — Other Ambulatory Visit: Payer: Self-pay | Admitting: *Deleted

## 2014-04-05 ENCOUNTER — Other Ambulatory Visit (HOSPITAL_COMMUNITY): Payer: Self-pay | Admitting: Cardiology

## 2014-04-05 DIAGNOSIS — I5023 Acute on chronic systolic (congestive) heart failure: Secondary | ICD-10-CM

## 2014-04-05 MED ORDER — CARVEDILOL 6.25 MG PO TABS
9.3750 mg | ORAL_TABLET | Freq: Two times a day (BID) | ORAL | Status: DC
Start: 2014-04-05 — End: 2014-05-12

## 2014-04-05 MED ORDER — APIXABAN 2.5 MG PO TABS: 2.5000 mg | ORAL_TABLET | Freq: Two times a day (BID) | ORAL | Status: DC

## 2014-04-19 ENCOUNTER — Encounter: Payer: Self-pay | Admitting: Internal Medicine

## 2014-04-19 ENCOUNTER — Ambulatory Visit (INDEPENDENT_AMBULATORY_CARE_PROVIDER_SITE_OTHER): Payer: Medicare Other | Admitting: *Deleted

## 2014-04-19 ENCOUNTER — Telehealth: Payer: Self-pay | Admitting: Cardiology

## 2014-04-19 DIAGNOSIS — I495 Sick sinus syndrome: Secondary | ICD-10-CM

## 2014-04-19 NOTE — Telephone Encounter (Signed)
Spoke with pt and reminded pt of remote transmission that is due today. Pt verbalized understanding.   

## 2014-04-20 LAB — MDC_IDC_ENUM_SESS_TYPE_REMOTE
Brady Statistic AP VP Percent: 100 %
Brady Statistic AP VS Percent: 0 %
Brady Statistic AS VP Percent: 0 %
Brady Statistic AS VS Percent: 0 %
Brady Statistic RA Percent Paced: 100 %
Date Time Interrogation Session: 20151201002808
Lead Channel Impedance Value: 323 Ohm
Lead Channel Impedance Value: 342 Ohm
Lead Channel Impedance Value: 361 Ohm
Lead Channel Impedance Value: 437 Ohm
Lead Channel Impedance Value: 627 Ohm
Lead Channel Pacing Threshold Amplitude: 0.875 V
Lead Channel Pacing Threshold Pulse Width: 0.4 ms
Lead Channel Sensing Intrinsic Amplitude: 0.75 mV
Lead Channel Setting Pacing Amplitude: 2.5 V
Lead Channel Setting Pacing Pulse Width: 0.4 ms
Lead Channel Setting Pacing Pulse Width: 0.4 ms
Lead Channel Setting Sensing Sensitivity: 0.9 mV
MDC IDC MSMT BATTERY REMAINING LONGEVITY: 85 mo
MDC IDC MSMT BATTERY VOLTAGE: 3.02 V
MDC IDC MSMT LEADCHNL LV IMPEDANCE VALUE: 380 Ohm
MDC IDC MSMT LEADCHNL LV IMPEDANCE VALUE: 608 Ohm
MDC IDC MSMT LEADCHNL LV IMPEDANCE VALUE: 798 Ohm
MDC IDC MSMT LEADCHNL RA IMPEDANCE VALUE: 304 Ohm
MDC IDC MSMT LEADCHNL RV PACING THRESHOLD AMPLITUDE: 0.75 V
MDC IDC MSMT LEADCHNL RV PACING THRESHOLD PULSEWIDTH: 0.4 ms
MDC IDC MSMT LEADCHNL RV SENSING INTR AMPL: 14.875 mV
MDC IDC SET LEADCHNL LV PACING AMPLITUDE: 2 V
MDC IDC SET LEADCHNL RA PACING AMPLITUDE: 2 V
MDC IDC SET ZONE DETECTION INTERVAL: 400 ms
MDC IDC STAT BRADY RV PERCENT PACED: 100 %
Zone Setting Detection Interval: 350 ms

## 2014-04-23 ENCOUNTER — Encounter: Payer: Self-pay | Admitting: Cardiology

## 2014-04-29 ENCOUNTER — Encounter (HOSPITAL_COMMUNITY): Payer: Self-pay | Admitting: Internal Medicine

## 2014-04-29 NOTE — Progress Notes (Signed)
Remote pacemaker transmission.   

## 2014-05-07 ENCOUNTER — Encounter: Payer: Self-pay | Admitting: Cardiology

## 2014-05-12 ENCOUNTER — Ambulatory Visit (HOSPITAL_BASED_OUTPATIENT_CLINIC_OR_DEPARTMENT_OTHER)
Admission: RE | Admit: 2014-05-12 | Discharge: 2014-05-12 | Disposition: A | Discharge status: Home / Self Care | Payer: Medicare Other | Source: Ambulatory Visit | Attending: Internal Medicine | Admitting: Internal Medicine

## 2014-05-12 ENCOUNTER — Ambulatory Visit (HOSPITAL_COMMUNITY)
Admission: RE | Admit: 2014-05-12 | Discharge: 2014-05-12 | Disposition: A | Discharge status: Home / Self Care | Payer: Medicare Other | Source: Ambulatory Visit | Attending: Internal Medicine | Admitting: Internal Medicine

## 2014-05-12 ENCOUNTER — Encounter (HOSPITAL_COMMUNITY): Payer: Self-pay

## 2014-05-12 VITALS — BP 116/72 | HR 94 | Wt 171.0 lb

## 2014-05-12 DIAGNOSIS — I509 Heart failure, unspecified: Secondary | ICD-10-CM | POA: Diagnosis not present

## 2014-05-12 DIAGNOSIS — I5022 Chronic systolic (congestive) heart failure: Secondary | ICD-10-CM

## 2014-05-12 DIAGNOSIS — I059 Rheumatic mitral valve disease, unspecified: Secondary | ICD-10-CM | POA: Diagnosis not present

## 2014-05-12 DIAGNOSIS — I10 Essential (primary) hypertension: Secondary | ICD-10-CM | POA: Diagnosis not present

## 2014-05-12 DIAGNOSIS — I5023 Acute on chronic systolic (congestive) heart failure: Secondary | ICD-10-CM | POA: Diagnosis not present

## 2014-05-12 LAB — BASIC METABOLIC PANEL
Anion gap: 9 (ref 5–15)
BUN: 47 mg/dL — AB (ref 6–23)
CHLORIDE: 103 meq/L (ref 96–112)
CO2: 28 mmol/L (ref 19–32)
CREATININE: 2.31 mg/dL — AB (ref 0.50–1.35)
Calcium: 9.9 mg/dL (ref 8.4–10.5)
GFR calc Af Amer: 28 mL/min — ABNORMAL LOW (ref >90)
GFR calc non Af Amer: 25 mL/min — ABNORMAL LOW (ref >90)
Glucose, Bld: 95 mg/dL (ref 70–99)
POTASSIUM: 4.2 mmol/L (ref 3.5–5.1)
Sodium: 140 mmol/L (ref 135–145)

## 2014-05-12 MED ORDER — CARVEDILOL 12.5 MG PO TABS: 12.5000 mg | ORAL_TABLET | Freq: Two times a day (BID) | ORAL | Status: DC

## 2014-05-12 NOTE — Progress Notes (Signed)
Patient ID: Marcus [name], male   DOB: October 22, 1928, 80 y.o.   MRN: [id number]  Primary cardiologist: Dr. Acie Fredrickson EP: Dr Clois Dupes is an 80 yo with history of pacemaker for bradycardia, paroxysmal atrial fibrillation, CAD s/p CABG and PCIs, and ischemic cardiomyopathy EF 20-25%. Underwent CRT-D upgrade 10/08/13 due to 85% RV pacing.     Admitted 5/19-5/26/15 with volume overload. He was diuresed with milrinone and IV lasix and discharge weight was 153 lbs. He underwent upgrade to his ICD to CRT-D as well.   Follow-up: He has felt much better since CRT upgrade.  Last visit carvedilol increased to 9.375 bid. He is in NSR and denies tachypalpitations.  Going to gym 1-2 days per week. Walks 1 mile on TM and does the machines. Working hard in the yard. Digging and removing shrubs. No chest pain or edema. No lightheadedness. Weight up to 171. (up 12 pounds for him) Taking torsemide 20mg /day. Occasionally will take 40 if needed. No bleeding with apixaban. In reviewing his chart was on low-dose hydralazine in 7/15 but has fallen off since. He doesn't remember being told to stop it.   Echo today: EF 35-40% RV dilated with mild RV dysfunction   ICD interrogated. No AF/VT. Activity 3hr/day. Optivol was way up and now coming back to baseline.   Labs (9/14). LDL 77 Labs (4/15): K 3.6, creatinine 1.9 Labs (5/15): K 3.8, creatinine 1.86 Labs (6/15): K 5.3, creatinine 1.86 Potassium stopped  Labs (7/15): K 4.4, creatinine 1.88 Labs (10/15): K 4.0, creatinine 2.18  PMH:  1. Symptomatic bradycardia: [employer] CRT-D 2. HTN 3. Hyperlipidemia 4. Atrial fibrillation: History of prior DCCV.  Paroxysmal.  On Eliquis.  Has history of cardioembolism to right leg with right popliteal and tibial embolectomy (had been off Eliquis). Has had nausea with amiodarone in the past but now tolerating.  5. Prostate cancer 6. CAD: CABG remotely with SVG-OM and LIMA-LAD.  Late 1990s had PCI to RCA.  In 12/09 had LHC with total  occlusion of SVG-OM, patent LIMA-LAD, DES to LCx and DES to RCA.   7. Ischemic cardiomyopathy: Echo (3/15) with EF 20-25%, wall motion abnormalities noted, mild to moderately decreased RV systolic function. ICD upgraded to CRT-D 10/08/13.  8. CKD  SH: Married, nonsmoker, lives in [place]  [place]: CAD  ROS: All systems reviewed and negative except as per HPI.   Current Outpatient Prescriptions  Medication Sig Dispense Refill  . amiodarone (PACERONE) 200 MG tablet Take 1 tablet my mouth Mon-Thurs, and 1/2 tablet by mouth Fri-Sun. 90 tablet 3  . apixaban (ELIQUIS) 2.5 MG TABS tablet Take 1 tablet (2.5 mg total) by mouth 2 (two) times daily. 60 tablet 3  . Ascorbic Acid (VITAMIN C PO) Take 1 tablet by mouth daily.     Marland Kitchen CALCIUM PO Take 1 tablet by mouth daily.     . carvedilol (COREG) 6.25 MG tablet Take 1.5 tablets (9.375 mg total) by mouth 2 (two) times daily with a meal. 270 tablet 3  . Cholecalciferol (VITAMIN D PO) Take 1 capsule by mouth daily.     . Coenzyme Q10 (COQ10 PO) Take 1-3 tablets by mouth See admin instructions. Take 3 tablets by mouth in the morning, and 1 tablet by mouth in the evening.    . isosorbide mononitrate (IMDUR) 30 MG 24 hr tablet Take 0.5 tablets (15 mg total) by mouth daily. 45 tablet 3  . Multiple Vitamins-Minerals (PRESERVISION AREDS 2) CAPS Take by mouth.    Marland Kitchen  Omega-3 Fatty Acids (OMEGA-3 FISH OIL PO) Take 30 mLs by mouth daily.    Marland Kitchen spironolactone (ALDACTONE) 25 MG tablet TAKE ONE-HALF TABLET BY MOUTH ONCE DAILY 45 tablet 3  . torsemide (DEMADEX) 20 MG tablet Take 1 tablet by mouth daily,Hold torsemide if your weight is under152 lb. Take an extra10 mg of torsemide if you weight is 158 or higher 45 tablet 3  . VITAMIN E PO Take 1 capsule by mouth daily.      No current facility-administered medications for this encounter.    Filed Vitals:   05/12/14 1415  BP: 116/72  Pulse: 94  Weight: 171 lb (77.565 kg)  SpO2: 97%   General: NAD, wife present Neck:  JVP 7-8 no thyromegaly or thyroid nodule.  Lungs: Clear to auscultation bilaterally with normal respiratory effort. CV: Nondisplaced PMI.  Heart regular S1/S2, 1/6 SEM. 1+  edema.  No carotid bruit.  Normal pedal pulses. R upper chest ICD  Abdomen: Soft, nontender, no hepatosplenomegaly, no distention.  Skin: Intact without lesions or rashes.  Neurologic: Alert and oriented x 3.  Psych: Normal affect. Extremities: No clubbing or cyanosis.   Assessment/Plan:  1. Chronic systolic CHF: Ischemic cardiomyopathy  Doing much better symptomatically after CRT upgrade. EF improved 35-40% on today's echo. - Stable with NYHA class II symptoms.   - Volume status slightly elevated on exam and by Optivol.  Reminded of need to use sliding scale torsemide and keep weight close to 165-167 at home - Continue prn torsemide use based on weight.  - Increase Coreg to 12.5 mg bid.  - Continue current spironolactone and hydralazine/Imdur.  Not on ACEI with CKD. 2. Atrial fibrillation: Paroxysmal. IN NSR  today. Continue amiodarone 200 mg daily.  Recent TSH/LFTs ok. Will need yearly eye exam.  He is on Eliquis 2.5 mg bid. 3. CAD: Stable. No chest pain. Continue medical management. He is not currently on a statin (he has declined).  He is not on ASA given use of Eliquis and stable CAD.  4. Sick sinus syndrome: s/p CRT-D. 5. CKD, stage III: Baseline creatinine 1.8-1.9. Check BMET today  Followup in 2 months.   Glori Bickers MD 05/12/2014

## 2014-05-12 NOTE — Progress Notes (Signed)
Echocardiogram 2D Echocardiogram has been performed.  Marcus [name] 05/12/2014, 1:57 PM

## 2014-05-12 NOTE — Patient Instructions (Signed)
INCREASE Carvedilol tp 12.5 mg twice a day  Your physician recommends that you schedule a follow-up appointment in: 2 months  Do the following things EVERYDAY: 1) Weigh yourself in the morning before breakfast. Write it down and keep it in a log. 2) Take your medicines as prescribed 3) Eat low salt foods-Limit salt (sodium) to 2000 mg per day.  4) Stay as active as you can everyday 5) Limit all fluids for the day to less than 2 liters 6)

## 2014-05-13 ENCOUNTER — Other Ambulatory Visit (HOSPITAL_COMMUNITY): Payer: Self-pay | Admitting: *Deleted

## 2014-05-13 DIAGNOSIS — I5023 Acute on chronic systolic (congestive) heart failure: Secondary | ICD-10-CM

## 2014-05-13 MED ORDER — CARVEDILOL 12.5 MG PO TABS
12.5000 mg | ORAL_TABLET | Freq: Two times a day (BID) | ORAL | Status: DC
Start: 2014-05-13 — End: 2014-07-27

## 2014-05-18 ENCOUNTER — Telehealth (HOSPITAL_COMMUNITY): Payer: Self-pay

## 2014-05-18 NOTE — Telephone Encounter (Signed)
Left message for patient to call us back in order to review lab work from 12/23.  Per Dr. Haroldine Laws, patient needs to have BMET repeated 2 weeks from that date for elevated Cr of 2.31.

## 2014-05-20 ENCOUNTER — Other Ambulatory Visit: Payer: Self-pay | Admitting: Cardiology

## 2014-05-26 ENCOUNTER — Ambulatory Visit (HOSPITAL_COMMUNITY)
Admission: RE | Admit: 2014-05-26 | Discharge: 2014-05-26 | Disposition: A | Discharge status: Home / Self Care | Payer: Medicare Other | Source: Ambulatory Visit | Attending: Internal Medicine | Admitting: Internal Medicine

## 2014-05-26 DIAGNOSIS — I5022 Chronic systolic (congestive) heart failure: Secondary | ICD-10-CM | POA: Diagnosis not present

## 2014-05-26 DIAGNOSIS — I5023 Acute on chronic systolic (congestive) heart failure: Secondary | ICD-10-CM | POA: Diagnosis not present

## 2014-05-26 LAB — BASIC METABOLIC PANEL
Anion gap: 7 (ref 5–15)
BUN: 30 mg/dL — ABNORMAL HIGH (ref 6–23)
CALCIUM: 9.6 mg/dL (ref 8.4–10.5)
CHLORIDE: 104 meq/L (ref 96–112)
CO2: 31 mmol/L (ref 19–32)
CREATININE: 2.22 mg/dL — AB (ref 0.50–1.35)
GFR calc Af Amer: 30 mL/min — ABNORMAL LOW (ref >90)
GFR calc non Af Amer: 26 mL/min — ABNORMAL LOW (ref >90)
Glucose, Bld: 94 mg/dL (ref 70–99)
Potassium: 4.5 mmol/L (ref 3.5–5.1)
Sodium: 142 mmol/L (ref 135–145)

## 2014-06-29 ENCOUNTER — Other Ambulatory Visit: Payer: Self-pay | Admitting: Cardiology

## 2014-06-29 MED ORDER — APIXABAN 2.5 MG PO TABS: 2.5000 mg | ORAL_TABLET | Freq: Two times a day (BID) | ORAL | Status: DC

## 2014-07-01 DIAGNOSIS — H3531 Nonexudative age-related macular degeneration: Secondary | ICD-10-CM | POA: Diagnosis not present

## 2014-07-07 ENCOUNTER — Ambulatory Visit (INDEPENDENT_AMBULATORY_CARE_PROVIDER_SITE_OTHER): Payer: Medicare Other | Admitting: Internal Medicine

## 2014-07-07 ENCOUNTER — Encounter: Payer: Medicare Other | Admitting: *Deleted

## 2014-07-07 ENCOUNTER — Encounter: Payer: Self-pay | Admitting: Internal Medicine

## 2014-07-07 ENCOUNTER — Telehealth: Payer: Self-pay | Admitting: Internal Medicine

## 2014-07-07 DIAGNOSIS — I10 Essential (primary) hypertension: Secondary | ICD-10-CM | POA: Diagnosis not present

## 2014-07-07 DIAGNOSIS — Z950 Presence of cardiac pacemaker: Secondary | ICD-10-CM | POA: Diagnosis not present

## 2014-07-07 DIAGNOSIS — I5022 Chronic systolic (congestive) heart failure: Secondary | ICD-10-CM | POA: Diagnosis not present

## 2014-07-07 DIAGNOSIS — I5023 Acute on chronic systolic (congestive) heart failure: Secondary | ICD-10-CM | POA: Diagnosis not present

## 2014-07-07 LAB — MDC_IDC_ENUM_SESS_TYPE_INCLINIC
Battery Remaining Longevity: 75 mo
Battery Voltage: 3.01 V
Brady Statistic AP VS Percent: 0.13 %
Brady Statistic AS VS Percent: 0 %
Brady Statistic RA Percent Paced: 99.85 %
Brady Statistic RV Percent Paced: 99.87 %
Date Time Interrogation Session: 20160217113608
Lead Channel Impedance Value: 399 Ohm
Lead Channel Impedance Value: 399 Ohm
Lead Channel Impedance Value: 456 Ohm
Lead Channel Impedance Value: 646 Ohm
Lead Channel Impedance Value: 817 Ohm
Lead Channel Pacing Threshold Amplitude: 0.75 V
Lead Channel Pacing Threshold Amplitude: 0.875 V
Lead Channel Pacing Threshold Pulse Width: 0.4 ms
Lead Channel Sensing Intrinsic Amplitude: 0.875 mV
Lead Channel Sensing Intrinsic Amplitude: 0.875 mV
Lead Channel Sensing Intrinsic Amplitude: 16.25 mV
Lead Channel Sensing Intrinsic Amplitude: 16.25 mV
Lead Channel Setting Pacing Amplitude: 2 V
Lead Channel Setting Pacing Amplitude: 2.5 V
Lead Channel Setting Sensing Sensitivity: 0.9 mV
MDC IDC MSMT LEADCHNL LV IMPEDANCE VALUE: 437 Ohm
MDC IDC MSMT LEADCHNL LV IMPEDANCE VALUE: 665 Ohm
MDC IDC MSMT LEADCHNL RA IMPEDANCE VALUE: 342 Ohm
MDC IDC MSMT LEADCHNL RA IMPEDANCE VALUE: 361 Ohm
MDC IDC MSMT LEADCHNL RV PACING THRESHOLD PULSEWIDTH: 0.4 ms
MDC IDC SET LEADCHNL LV PACING PULSEWIDTH: 0.4 ms
MDC IDC SET LEADCHNL RA PACING AMPLITUDE: 2 V
MDC IDC SET LEADCHNL RV PACING PULSEWIDTH: 0.4 ms
MDC IDC STAT BRADY AP VP PERCENT: 99.72 %
MDC IDC STAT BRADY AS VP PERCENT: 0.15 %
Zone Setting Detection Interval: 350 ms
Zone Setting Detection Interval: 400 ms

## 2014-07-07 NOTE — Progress Notes (Signed)
HPI Marcus [name] returns today for followup. He is a pleasant 81 yo man with complete heart block, s/p PPM insertion in 2004 and change out in 2014 and worsening heart failure with biv upgrade a year ago. His last procedure was complicated by the development of a hematoma which resolved. He noted drainage on the lateral aspect of his incision. He has not had fever or chills. No Known Allergies   Current Outpatient Prescriptions  Medication Sig Dispense Refill  . amiodarone (PACERONE) 200 MG tablet Take 1 tablet my mouth Mon-Thurs, and 1/2 tablet by mouth Fri-Sun. 90 tablet 3  . apixaban (ELIQUIS) 2.5 MG TABS tablet Take 1 tablet (2.5 mg total) by mouth 2 (two) times daily. 60 tablet 3  . Ascorbic Acid (VITAMIN C PO) Take 1 tablet by mouth daily.     Marland Kitchen CALCIUM PO Take 1 tablet by mouth daily.     . carvedilol (COREG) 12.5 MG tablet Take 1 tablet (12.5 mg total) by mouth 2 (two) times daily with a meal. 180 tablet 3  . Cholecalciferol (VITAMIN D PO) Take 1 capsule by mouth daily.     . Coenzyme Q10 (COQ10 PO) Take 1-3 tablets by mouth See admin instructions. Take 3 tablets by mouth in the morning, and 1 tablet by mouth in the evening.    . isosorbide mononitrate (IMDUR) 30 MG 24 hr tablet Take 0.5 tablets (15 mg total) by mouth daily. 45 tablet 3  . Multiple Vitamins-Minerals (PRESERVISION AREDS 2) CAPS Take by mouth.    . Omega-3 Fatty Acids (OMEGA-3 FISH OIL PO) Take 30 mLs by mouth daily.    Marland Kitchen spironolactone (ALDACTONE) 25 MG tablet TAKE ONE-HALF TABLET BY MOUTH ONCE DAILY 45 tablet 3  . torsemide (DEMADEX) 20 MG tablet FOR INSTRUCTIONS ON THE    PROPER DOSING OF YOUR      MEDICATION REFER TO YOUR   PARTICIPANT COUNSELING FORM 135 tablet 0  . VITAMIN E PO Take 1 capsule by mouth daily.      No current facility-administered medications for this visit.     Past Medical History  Diagnosis Date  . Coronary artery disease     a. s/p remote CABG x 2(VG->OM, LIMA->LAD;  b. Late 90's  s/p PCI of RCA;  c. 12/09 Cath/PCI: LM 28m, 70-80d (3.0x56mm Xience DES), LAD100p, LCX 80-90p (2.25x49mmTaxus Atom DES), RCA 100d (2.5x19mm Xience DES), VG->OM 100, LIMA->LAD nl, EF 30-35%.  . Hyperlipidemia   . Prostate cancer   . PAF (paroxysmal atrial fibrillation)   . Ischemic cardiomyopathy     a. EF 35-40% by echo 05/2012, b. EF 20-25%, akinesis and scarring of inferolateral, inferior and inferoseptal myocardium, mild AI, mod MR, LA mod dilated, RV mildly dialted and sys fx mildly reduced, RA mildly dilated (09/2013)   . Symptomatic bradycardia     a. 08/2003 s/p MDT Enpulse [id number] Dual chamber PPM ser # [id number] H.  . Atrial fibrillation   . CHF (congestive heart failure)     a. CRT-D (10/08/2013)  . Peripheral vascular disease     ROS:   All systems reviewed and negative except as noted in the HPI.   Past Surgical History  Procedure Laterality Date  . Insert / replace / remove pacemaker    . Doppler echocardiography  06/24/2002    EF 60-65%  . Tee without cardioversion  06/26/2012    Procedure: TRANSESOPHAGEAL ECHOCARDIOGRAM (TEE);  Surgeon: Lelon Perla, MD;  Location: [facility];  Service: Cardiovascular;  Laterality: [name];  . Cardioversion  06/26/2012    Procedure: CARDIOVERSION;  Surgeon: Lelon Perla, MD;  Location: [facility] ENDOSCOPY;  Service: Cardiovascular;  Laterality: [name];  . Appendectomy    . Angioplasty      stent placement  . Embolectomy Right 07/28/2013    Procedure: Right Popliteal and Tibial Embolectomy;  Surgeon: Angelia Mould, MD;  Location: [facility] OR;  Service: Vascular;  Laterality: Right;  . Coronary artery bypass graft  1990  . Permanent pacemaker generator change [name] 08/27/2012    Procedure: PERMANENT PACEMAKER GENERATOR CHANGE;  Surgeon: [name], MD;  Location: [facility] CATH LAB;  Service: Cardiovascular;  Laterality: [name];  . Bi-ventricular pacemaker upgrade [name] 10/08/2013    Procedure: BI-VENTRICULAR PACEMAKER UPGRADE;  Surgeon: [name], MD;   Location[facility] CATH LAB;  Service: Cardiovascular;  Laterality: [name];     Family History  Problem Relation Age of Onset  . Hypertension Mother   . Colon cancer Mother   . Cancer Mother     Colon  . Alcohol abuse Father      History   Social History  . Marital Status: Married    Spouse Name: [name]  . Number of Children: 0  . Years of Education: [name]   Occupational History  . retired    Social History Main Topics  . Smoking status: Former Smoker    Quit date: 02/10/1971  . Smokeless tobacco: Never Used  . Alcohol Use: No  . Drug Use: No  . Sexual Activity: No   Other Topics Concern  . Not on file   Social History Narrative   Lives in [place] with wife.  Retired.     There were no vitals taken for this visit.  Physical Exam:  Well appearing 81 yo man, NAD HEENT: Unremarkable Neck:  No JVD, no thyromegally Back:  No CVA tenderness Lungs:  Clear with no wheezes, lateral aspect of his PPM incision is open and leads are easily visible.  HEART:  Regular rate rhythm, no murmurs, no rubs, no clicks Abd:  soft, positive bowel sounds, no organomegally, no rebound, no guarding Ext:  2 plus pulses, no edema, no cyanosis, no clubbing Skin:  No rashes no nodules Neuro:  CN II through XII intact, motor grossly intact    DEVICE  Normal device function.  See PaceArt for details. Underlying complete heart block.   Assess/Plan:

## 2014-07-07 NOTE — Assessment & Plan Note (Signed)
His PPM pocket has opened. He is not systemically ill. I have discussed the treatment options with the patient and recommended device removal and insertion of a new PPM. This will be scheduled in the next week or two.

## 2014-07-07 NOTE — Telephone Encounter (Signed)
New message     Wife says area where the device is on the patient has wires sticking out.  Pacemaker was put in a year ago.  The area is bleeding.

## 2014-07-07 NOTE — Assessment & Plan Note (Signed)
His blood pressures have been well controlled.

## 2014-07-07 NOTE — Assessment & Plan Note (Signed)
His heart failure symptoms are class 2. He will continue his current meds.

## 2014-07-07 NOTE — Telephone Encounter (Signed)
Pt agreed to appt today at 11:00 AM

## 2014-07-08 ENCOUNTER — Telehealth: Payer: Self-pay | Admitting: Internal Medicine

## 2014-07-08 NOTE — Telephone Encounter (Signed)
New message     Pt was seen yesterday.  He has leads and wires coming out of his skin.  Until they can fix it, should it be bandaged so that it will not become infected?

## 2014-07-08 NOTE — Telephone Encounter (Signed)
After consulting with device tech. Called pt wife and informed her to apply a dressing to the area but loosely. She had additional questions. forwarded call to device tech.

## 2014-07-08 NOTE — Telephone Encounter (Signed)
Spoke with wife, she was questioning the date for her husband's surgery.  Message given to Trinidad Curet, RN .

## 2014-07-09 ENCOUNTER — Other Ambulatory Visit: Payer: Self-pay | Admitting: *Deleted

## 2014-07-09 ENCOUNTER — Encounter: Payer: Self-pay | Admitting: *Deleted

## 2014-07-09 DIAGNOSIS — Z01812 Encounter for preprocedural laboratory examination: Secondary | ICD-10-CM

## 2014-07-09 DIAGNOSIS — T82110A Breakdown (mechanical) of cardiac electrode, initial encounter: Secondary | ICD-10-CM

## 2014-07-13 NOTE — Telephone Encounter (Signed)
Late Entry: Spoke with patient's wife 2/19, late afternoon, informing her procedure scheduled for 2/29. Pre labs 2/24. Reviewed letter of instructions with patient and left at front desk for their pick up. Patient's wife verbalized understanding and agreeable to plan.

## 2014-07-14 ENCOUNTER — Other Ambulatory Visit (INDEPENDENT_AMBULATORY_CARE_PROVIDER_SITE_OTHER): Payer: Medicare Other | Admitting: *Deleted

## 2014-07-14 ENCOUNTER — Telehealth: Payer: Self-pay | Admitting: Internal Medicine

## 2014-07-14 ENCOUNTER — Encounter (HOSPITAL_COMMUNITY): Payer: Self-pay

## 2014-07-14 ENCOUNTER — Encounter (HOSPITAL_COMMUNITY)
Admission: RE | Admit: 2014-07-14 | Discharge: 2014-07-14 | Disposition: A | Discharge status: Home / Self Care | Payer: Medicare Other | Source: Ambulatory Visit | Attending: Internal Medicine | Admitting: Internal Medicine

## 2014-07-14 ENCOUNTER — Other Ambulatory Visit: Payer: Self-pay | Admitting: Internal Medicine

## 2014-07-14 ENCOUNTER — Other Ambulatory Visit: Payer: Self-pay

## 2014-07-14 VITALS — BP 105/66 | HR 72 | Temp 97.5°F | Resp 20 | Ht 69.0 in | Wt 174.2 lb

## 2014-07-14 DIAGNOSIS — I5022 Chronic systolic (congestive) heart failure: Secondary | ICD-10-CM | POA: Diagnosis not present

## 2014-07-14 DIAGNOSIS — Z0183 Encounter for blood typing: Secondary | ICD-10-CM | POA: Diagnosis not present

## 2014-07-14 DIAGNOSIS — Z951 Presence of aortocoronary bypass graft: Secondary | ICD-10-CM | POA: Insufficient documentation

## 2014-07-14 DIAGNOSIS — R9431 Abnormal electrocardiogram [ECG] [EKG]: Secondary | ICD-10-CM | POA: Insufficient documentation

## 2014-07-14 DIAGNOSIS — Z79899 Other long term (current) drug therapy: Secondary | ICD-10-CM | POA: Diagnosis not present

## 2014-07-14 DIAGNOSIS — Z01818 Encounter for other preprocedural examination: Secondary | ICD-10-CM | POA: Insufficient documentation

## 2014-07-14 DIAGNOSIS — H3530 Unspecified macular degeneration: Secondary | ICD-10-CM | POA: Insufficient documentation

## 2014-07-14 DIAGNOSIS — Z950 Presence of cardiac pacemaker: Secondary | ICD-10-CM | POA: Diagnosis not present

## 2014-07-14 DIAGNOSIS — I2510 Atherosclerotic heart disease of native coronary artery without angina pectoris: Secondary | ICD-10-CM | POA: Insufficient documentation

## 2014-07-14 DIAGNOSIS — Z7902 Long term (current) use of antithrombotics/antiplatelets: Secondary | ICD-10-CM | POA: Insufficient documentation

## 2014-07-14 DIAGNOSIS — Z01812 Encounter for preprocedural laboratory examination: Secondary | ICD-10-CM

## 2014-07-14 DIAGNOSIS — T827XXD Infection and inflammatory reaction due to other cardiac and vascular devices, implants and grafts, subsequent encounter: Secondary | ICD-10-CM

## 2014-07-14 DIAGNOSIS — Z87891 Personal history of nicotine dependence: Secondary | ICD-10-CM | POA: Insufficient documentation

## 2014-07-14 DIAGNOSIS — T82110A Breakdown (mechanical) of cardiac electrode, initial encounter: Secondary | ICD-10-CM | POA: Diagnosis not present

## 2014-07-14 DIAGNOSIS — I442 Atrioventricular block, complete: Secondary | ICD-10-CM | POA: Diagnosis not present

## 2014-07-14 DIAGNOSIS — I4891 Unspecified atrial fibrillation: Secondary | ICD-10-CM | POA: Insufficient documentation

## 2014-07-14 DIAGNOSIS — N189 Chronic kidney disease, unspecified: Secondary | ICD-10-CM | POA: Insufficient documentation

## 2014-07-14 DIAGNOSIS — I129 Hypertensive chronic kidney disease with stage 1 through stage 4 chronic kidney disease, or unspecified chronic kidney disease: Secondary | ICD-10-CM | POA: Diagnosis not present

## 2014-07-14 HISTORY — DX: Unspecified hearing loss, unspecified ear: H91.90

## 2014-07-14 HISTORY — DX: Personal history of peptic ulcer disease: Z87.11

## 2014-07-14 HISTORY — DX: Personal history of other diseases of the musculoskeletal system and connective tissue: Z87.39

## 2014-07-14 HISTORY — DX: Personal history of other diseases of the digestive system: Z87.19

## 2014-07-14 HISTORY — DX: Nonexudative age-related macular degeneration, unspecified eye, stage unspecified: H35.3190

## 2014-07-14 HISTORY — DX: Personal history of colon polyps, unspecified: Z86.0100

## 2014-07-14 HISTORY — DX: Personal history of other infectious and parasitic diseases: Z86.19

## 2014-07-14 HISTORY — DX: Essential (primary) hypertension: I10

## 2014-07-14 HISTORY — DX: Unspecified cataract: H26.9

## 2014-07-14 HISTORY — DX: Personal history of colonic polyps: Z86.010

## 2014-07-14 LAB — BASIC METABOLIC PANEL
ANION GAP: 10 (ref 5–15)
BUN: 89 mg/dL (ref 6–23)
BUN: 86 mg/dL — AB (ref 6–23)
CALCIUM: 9.7 mg/dL (ref 8.4–10.5)
CHLORIDE: 101 meq/L (ref 96–112)
CHLORIDE: 102 mmol/L (ref 96–112)
CO2: 30 meq/L (ref 19–32)
CO2: 25 mmol/L (ref 19–32)
Calcium: 9.5 mg/dL (ref 8.4–10.5)
Creatinine, Ser: 2.46 mg/dL — ABNORMAL HIGH (ref 0.40–1.50)
Creatinine, Ser: 2.5 mg/dL — ABNORMAL HIGH (ref 0.50–1.35)
GFR calc Af Amer: 26 mL/min — ABNORMAL LOW (ref >90)
GFR calc non Af Amer: 22 mL/min — ABNORMAL LOW (ref >90)
GFR: 26.82 mL/min — ABNORMAL LOW (ref >60.00)
Glucose, Bld: 94 mg/dL (ref 70–99)
Glucose, Bld: 96 mg/dL (ref 70–99)
POTASSIUM: 4.4 mmol/L (ref 3.5–5.1)
Potassium: 4.4 mEq/L (ref 3.5–5.1)
SODIUM: 137 meq/L (ref 135–145)
Sodium: 137 mmol/L (ref 135–145)

## 2014-07-14 LAB — CBC
HCT: 40.6 % (ref 39.0–52.0)
HEMOGLOBIN: 13.5 g/dL (ref 13.0–17.0)
MCH: 31.8 pg (ref 26.0–34.0)
MCHC: 33.3 g/dL (ref 30.0–36.0)
MCV: 95.5 fL (ref 78.0–100.0)
PLATELETS: 138 10*3/uL — AB (ref 150–400)
RBC: 4.25 MIL/uL (ref 4.22–5.81)
RDW: 14.2 % (ref 11.5–15.5)
WBC: 5 10*3/uL (ref 4.0–10.5)

## 2014-07-14 LAB — CBC WITH DIFFERENTIAL/PLATELET
BASOS ABS: 0 10*3/uL (ref 0.0–0.1)
Basophils Relative: 0.6 % (ref 0.0–3.0)
EOS PCT: 3.2 % (ref 0.0–5.0)
Eosinophils Absolute: 0.2 10*3/uL (ref 0.0–0.7)
HEMATOCRIT: 40 % (ref 39.0–52.0)
Hemoglobin: 13.4 g/dL (ref 13.0–17.0)
LYMPHS ABS: 1.1 10*3/uL (ref 0.7–4.0)
Lymphocytes Relative: 19.3 % (ref 12.0–46.0)
MCHC: 33.5 g/dL (ref 30.0–36.0)
MCV: 95.6 fl (ref 78.0–100.0)
Monocytes Absolute: 0.5 10*3/uL (ref 0.1–1.0)
Monocytes Relative: 8.7 % (ref 3.0–12.0)
NEUTROS ABS: 3.8 10*3/uL (ref 1.4–7.7)
Neutrophils Relative %: 68.2 % (ref 43.0–77.0)
Platelets: 145 10*3/uL — ABNORMAL LOW (ref 150.0–400.0)
RBC: 4.18 Mil/uL — AB (ref 4.22–5.81)
RDW: 14.9 % (ref 11.5–15.5)
WBC: 5.6 10*3/uL (ref 4.0–10.5)

## 2014-07-14 LAB — SURGICAL PCR SCREEN
MRSA, PCR: NEGATIVE
STAPHYLOCOCCUS AUREUS: NEGATIVE

## 2014-07-14 LAB — ABO/RH: ABO/RH(D): O POS

## 2014-07-14 MED ORDER — CHLORHEXIDINE GLUCONATE 4 % EX LIQD: 60.0000 mL | Freq: Once | CUTANEOUS | Status: DC

## 2014-07-14 NOTE — Telephone Encounter (Signed)
Patient's wife wants confirmation of procedure for Monday.  She states that hospital is saying explant/implant new device, but she understood from visit with Dr. Lovena Le that it was explant and will implant new device at a later time. I told her that I would check into this and confirm details with Dr. Lovena Le. I also informed her of abnormal kidney fxn results.  She is aware also being reviewed with Dr. Lovena Le and know I will call her when both have been adressed.

## 2014-07-14 NOTE — Pre-Procedure Instructions (Signed)
Marcus [name]  07/14/2014   Your procedure is scheduled on:  Mon, Feb 29 @ 7:30 AM  Report to [facility]  at 5:30 AM.  Call this number if you have problems the morning of surgery: [phone]   Remember:   Do not eat food or drink liquids after midnight.   Take these medicines the morning of surgery with A SIP OF WATER: Amiodarone(Pacerone),Carvedilol(Coreg),and Imdur(Isosorbide)                Stop taking your Vit E,CO Q10,and Fish Oil.Also no Goody's,BC's,Aleve,Ibuprofen,or any Herbal Medications.   Do not wear jewelry  Do not wear lotions, powders, or colognes.   Men may shave face and neck.  Do not bring valuables to the hospital.  [facility] is not responsible                  for any belongings or valuables.               Contacts, dentures or bridgework may not be worn into surgery.  Leave suitcase in the car. After surgery it may be brought to your room.  For patients admitted to the hospital, discharge time is determined by your                treatment team.                 Special Instructions:  [facility] - Preparing for Surgery  Before surgery, you can play an important role.  Because skin is not sterile, your skin needs to be as free of germs as possible.  You can reduce the number of germs on you skin by washing with CHG (chlorahexidine gluconate) soap before surgery.  CHG is an antiseptic cleaner which kills germs and bonds with the skin to continue killing germs even after washing.  Please DO NOT use if you have an allergy to CHG or antibacterial soaps.  If your skin becomes reddened/irritated stop using the CHG and inform your nurse when you arrive at Short Stay.  Do not shave (including legs and underarms) for at least 48 hours prior to the first CHG shower.  You may shave your face.  Please follow these instructions carefully:   1.  Shower with CHG Soap the night before surgery and the                                morning of Surgery.  2.  If  you choose to wash your hair, wash your hair first as usual with your       normal shampoo.  3.  After you shampoo, rinse your hair and body thoroughly to remove the                      Shampoo.  4.  Use CHG as you would any other liquid soap.  You can apply chg directly       to the skin and wash gently with scrungie or a clean washcloth.  5.  Apply the CHG Soap to your body ONLY FROM THE NECK DOWN.        Do not use on open wounds or open sores.  Avoid contact with your eyes,       ears, mouth and genitals (private parts).  Wash genitals (private parts)       with your normal soap.  6.  Wash thoroughly, paying special attention to the area where your surgery        will be performed.  7.  Thoroughly rinse your body with warm water from the neck down.  8.  DO NOT shower/wash with your normal soap after using and rinsing off       the CHG Soap.  9.  Pat yourself dry with a clean towel.            10.  Wear clean pajamas.            11.  Place clean sheets on your bed the night of your first shower and do not        sleep with pets.  Day of Surgery  Do not apply any lotions/deoderants the morning of surgery.  Please wear clean clothes to the hospital/surgery center.     Please read over the following fact sheets that you were given: Pain Booklet, Coughing and Deep Breathing, Blood Transfusion Information, MRSA Information and Surgical Site Infection Prevention

## 2014-07-14 NOTE — Progress Notes (Signed)
Notified Tomi Bamberger with [employer] about pt surgery date and time.She will be here.

## 2014-07-14 NOTE — Progress Notes (Addendum)
Cardiologist is Dr.Bensimone with last visit in epic from 05/12/14  Multiple echo reports in epic   Stress test 30+yrs ago   Heart cath in epic from 2009  Denies EKG in past month  Denies CXR in past yr

## 2014-07-14 NOTE — Telephone Encounter (Signed)
New Msg         Pt wife states returning call from today, thinks its in regards to procedure pt having completed with Dr. Lovena Le.   Please return call.

## 2014-07-14 NOTE — Telephone Encounter (Addendum)
Informed patient procedure is for explantation and temp pacer.  Wound check scheduled for 3/9 and informed her that we would schedule reimplanting at wound check. Also informed that lab work reviewed with Dr. Lovena Le, no orders received. Made aware to hold Eliquis Sunday and Monday morning. She verbalized understanding and agreeable.

## 2014-07-15 ENCOUNTER — Encounter (HOSPITAL_COMMUNITY): Payer: Self-pay

## 2014-07-15 NOTE — Progress Notes (Signed)
Anesthesia Chart Review:  Patient is a 81 year old male scheduled for pacemaker system extraction and implantation of new pacemaker system on 07/19/14 by Dr. Lovena Le. CT surgeon Dr. Prescott Gum is for back-up.  History includes remote former smoker, CAD s/p CABG (LIMA to LAD, SVG to OM)'90 with RCA PCI '97/'98 and DES to RCA, OM1 and LM 04/2008, ischemic CM, afib/PAF s/p DCCV, bradycardia/CHB s/p Medtronic PPM '05 (upgrade to BiV '15 complicated by hematoma), HTN, CKD, chronic systolic CHF, gout, unsteady gait, macular degeneration, right popliteal and tibial embolectomy (while off Eliquis) 07/28/13. Primary cardiologist is Dr. Acie Fredrickson. EP Dr. Lovena Le.  CHF [facility] Dr. Haroldine Laws. Patient reports that he does not have a PCP or nephrologist.  He states that Dr. Haroldine Laws is monitoring his renal function.   Meds include amiodarone, Eliquis, Coreg, Imdur, Omega 3, Aldactone (patients says he is no longer taking), Demadex PRN (patient says now taking daily). PAT RN indicates that Dr. Tanna Furry office instructed patient to only hold Eliquis on the day of surgery.   07/14/14 EKG: AV dual paced rhythm with frequent bigeminy PVCs.  05/12/14 Echo: - Left ventricle: Diffuse hypokinesis mid and basilar inferior wall akinesis The cavity size was mildly dilated. Wall thickness was normal. Systolic function was moderately reduced. The estimated ejection fraction was in the range of 35% to 40%. Doppler parameters are consistent with elevated ventricular end-diastolic filling pressure. - Mitral valve: There was mild regurgitation. - Left atrium: The atrium was moderately dilated. - Right ventricle: The cavity size was moderately dilated. - Right atrium: The atrium was moderately dilated. - Atrial septum: No defect or patent foramen ovale was identified.  His last cardiac caths were on 05/04/08 and 05/07/08 with interventions to his LM, OM1, and RCA.  Reports are under Notes tab.  10/09/13 CXR: IMPRESSION:  Improved pulmonary edema. Minimal bibasilar atelectasis.  Preoperative labs noted. Two BMETs done yesterday (one had been ordered by Dr. Haroldine Laws).  BUN in the high 80's, Cr 2.46/2.50.  Previous BUN 30-50, Cr 2.0 - 2.31 since 02/10/14.  H/H 13.5/40.6, PLT count 138K.  Glucose 96. T&S done.  Patient's PPM pocket has opened up and he needs his old device extracted and new PPM implanted.  He continues to deny fever. He has evidence of CKD which appears to be progressing over the past 6 months. I discussed with anesthesiologist Dr. Tobias Alexander.  Based on the need for and type of procedure, patient's renal function alone should not prohibit plans for surgery, but he will need follow-up. I called and left a voice message with staff at Dr. Clayborne Dana office to confirm he had reviewed labs for additional recommendations.  (Since he ordered labs too, they should already be in his in-box for review.)  I also notified patient that he will need on-going monitoring of his renal function.  Dr. Lovena Le has reviewed labs and no new orders given.  [facility]/Anesthesiology Phone [phone] 07/15/2014 3:47 PM

## 2014-07-15 NOTE — Progress Notes (Signed)
Spoke with Claiborne Billings at [employer] office about Eliquis-pt doesn't need to stop just doesn't need to take morning of

## 2014-07-15 NOTE — Progress Notes (Addendum)
Spoke with Claiborne Billings at [place] office in regards to removing old pacemaker and putting in a new one,being that pt and wife heard different things at appointment with Dr.Taylor.She said they are going to take old one out and place a new one on the left side.Informed pt and wife of this info.

## 2014-07-18 MED ORDER — CEFAZOLIN SODIUM-DEXTROSE 2-3 GM-% IV SOLR
2.0000 g | INTRAVENOUS | Status: AC
  Administered 2014-07-19: 2 g via INTRAVENOUS
  Filled 2014-07-18: qty 50

## 2014-07-18 MED ORDER — SODIUM CHLORIDE 0.9 % IR SOLN
80.0000 mg | Status: DC
  Filled 2014-07-18: qty 2

## 2014-07-19 ENCOUNTER — Ambulatory Visit (HOSPITAL_COMMUNITY): Payer: Medicare Other | Admitting: Certified Registered"

## 2014-07-19 ENCOUNTER — Inpatient Hospital Stay (HOSPITAL_COMMUNITY)
Admission: RE | Admit: 2014-07-19 | Discharge: 2014-07-27 | DRG: 242 | Disposition: A | Discharge status: Home / Self Care | Payer: Medicare Other | Source: Ambulatory Visit | Attending: Internal Medicine | Admitting: Internal Medicine

## 2014-07-19 ENCOUNTER — Encounter (HOSPITAL_COMMUNITY): Payer: Self-pay | Admitting: Surgery

## 2014-07-19 ENCOUNTER — Encounter (HOSPITAL_COMMUNITY)
Admission: RE | Disposition: A | Discharge status: Home / Self Care | Payer: Medicare Other | Source: Ambulatory Visit | Attending: Internal Medicine

## 2014-07-19 ENCOUNTER — Ambulatory Visit (HOSPITAL_COMMUNITY): Payer: Medicare Other | Admitting: Vascular Surgery

## 2014-07-19 ENCOUNTER — Ambulatory Visit (HOSPITAL_COMMUNITY): Payer: Medicare Other

## 2014-07-19 DIAGNOSIS — R570 Cardiogenic shock: Secondary | ICD-10-CM | POA: Diagnosis not present

## 2014-07-19 DIAGNOSIS — Z79899 Other long term (current) drug therapy: Secondary | ICD-10-CM

## 2014-07-19 DIAGNOSIS — I5022 Chronic systolic (congestive) heart failure: Secondary | ICD-10-CM | POA: Diagnosis not present

## 2014-07-19 DIAGNOSIS — Z8546 Personal history of malignant neoplasm of prostate: Secondary | ICD-10-CM

## 2014-07-19 DIAGNOSIS — I4891 Unspecified atrial fibrillation: Secondary | ICD-10-CM | POA: Diagnosis not present

## 2014-07-19 DIAGNOSIS — H9190 Unspecified hearing loss, unspecified ear: Secondary | ICD-10-CM | POA: Diagnosis present

## 2014-07-19 DIAGNOSIS — T827XXD Infection and inflammatory reaction due to other cardiac and vascular devices, implants and grafts, subsequent encounter: Secondary | ICD-10-CM

## 2014-07-19 DIAGNOSIS — Z87891 Personal history of nicotine dependence: Secondary | ICD-10-CM

## 2014-07-19 DIAGNOSIS — I129 Hypertensive chronic kidney disease with stage 1 through stage 4 chronic kidney disease, or unspecified chronic kidney disease: Secondary | ICD-10-CM | POA: Diagnosis present

## 2014-07-19 DIAGNOSIS — I480 Paroxysmal atrial fibrillation: Secondary | ICD-10-CM | POA: Diagnosis not present

## 2014-07-19 DIAGNOSIS — I509 Heart failure, unspecified: Secondary | ICD-10-CM | POA: Diagnosis not present

## 2014-07-19 DIAGNOSIS — Y831 Surgical operation with implant of artificial internal device as the cause of abnormal reaction of the patient, or of later complication, without mention of misadventure at the time of the procedure: Secondary | ICD-10-CM | POA: Diagnosis present

## 2014-07-19 DIAGNOSIS — I5023 Acute on chronic systolic (congestive) heart failure: Secondary | ICD-10-CM | POA: Diagnosis not present

## 2014-07-19 DIAGNOSIS — I4892 Unspecified atrial flutter: Secondary | ICD-10-CM | POA: Diagnosis not present

## 2014-07-19 DIAGNOSIS — I2510 Atherosclerotic heart disease of native coronary artery without angina pectoris: Secondary | ICD-10-CM | POA: Diagnosis not present

## 2014-07-19 DIAGNOSIS — N179 Acute kidney failure, unspecified: Secondary | ICD-10-CM | POA: Diagnosis not present

## 2014-07-19 DIAGNOSIS — Z452 Encounter for adjustment and management of vascular access device: Secondary | ICD-10-CM | POA: Diagnosis not present

## 2014-07-19 DIAGNOSIS — I442 Atrioventricular block, complete: Secondary | ICD-10-CM | POA: Diagnosis not present

## 2014-07-19 DIAGNOSIS — I255 Ischemic cardiomyopathy: Secondary | ICD-10-CM | POA: Diagnosis present

## 2014-07-19 DIAGNOSIS — Z7901 Long term (current) use of anticoagulants: Secondary | ICD-10-CM

## 2014-07-19 DIAGNOSIS — T827XXA Infection and inflammatory reaction due to other cardiac and vascular devices, implants and grafts, initial encounter: Secondary | ICD-10-CM | POA: Diagnosis not present

## 2014-07-19 DIAGNOSIS — Z45018 Encounter for adjustment and management of other part of cardiac pacemaker: Secondary | ICD-10-CM | POA: Diagnosis not present

## 2014-07-19 DIAGNOSIS — Z955 Presence of coronary angioplasty implant and graft: Secondary | ICD-10-CM

## 2014-07-19 DIAGNOSIS — Z951 Presence of aortocoronary bypass graft: Secondary | ICD-10-CM

## 2014-07-19 DIAGNOSIS — I959 Hypotension, unspecified: Secondary | ICD-10-CM | POA: Diagnosis not present

## 2014-07-19 DIAGNOSIS — Z950 Presence of cardiac pacemaker: Secondary | ICD-10-CM

## 2014-07-19 DIAGNOSIS — N183 Chronic kidney disease, stage 3 (moderate): Secondary | ICD-10-CM | POA: Diagnosis present

## 2014-07-19 DIAGNOSIS — Z959 Presence of cardiac and vascular implant and graft, unspecified: Secondary | ICD-10-CM

## 2014-07-19 HISTORY — PX: CARDIAC CATHETERIZATION: SHX172

## 2014-07-19 HISTORY — PX: PACEMAKER LEAD REMOVAL: SHX5064

## 2014-07-19 HISTORY — DX: Unspecified atrial flutter: I48.92

## 2014-07-19 HISTORY — DX: Chronic kidney disease, stage 3 (moderate): N18.3

## 2014-07-19 HISTORY — DX: Infection and inflammatory reaction due to other cardiac and vascular devices, implants and grafts, initial encounter: T82.7XXA

## 2014-07-19 HISTORY — DX: Chronic kidney disease, stage 3 unspecified: N18.30

## 2014-07-19 HISTORY — DX: Chronic systolic (congestive) heart failure: I50.22

## 2014-07-19 LAB — BASIC METABOLIC PANEL
Anion gap: 9 (ref 5–15)
BUN: 79 mg/dL — ABNORMAL HIGH (ref 6–23)
CO2: 21 mmol/L (ref 19–32)
CREATININE: 2.2 mg/dL — AB (ref 0.50–1.35)
Calcium: 8.3 mg/dL — ABNORMAL LOW (ref 8.4–10.5)
Chloride: 107 mmol/L (ref 96–112)
GFR calc Af Amer: 30 mL/min — ABNORMAL LOW (ref >90)
GFR calc non Af Amer: 26 mL/min — ABNORMAL LOW (ref >90)
GLUCOSE: 220 mg/dL — AB (ref 70–99)
Potassium: 4 mmol/L (ref 3.5–5.1)
Sodium: 137 mmol/L (ref 135–145)

## 2014-07-19 LAB — COMPREHENSIVE METABOLIC PANEL
ALK PHOS: 27 U/L — AB (ref 39–117)
ALT: 26 U/L (ref 0–53)
AST: 46 U/L — ABNORMAL HIGH (ref 0–37)
Albumin: 3.1 g/dL — ABNORMAL LOW (ref 3.5–5.2)
Anion gap: 8 (ref 5–15)
BUN: 77 mg/dL — ABNORMAL HIGH (ref 6–23)
CO2: 23 mmol/L (ref 19–32)
Calcium: 8.3 mg/dL — ABNORMAL LOW (ref 8.4–10.5)
Chloride: 104 mmol/L (ref 96–112)
Creatinine, Ser: 2.32 mg/dL — ABNORMAL HIGH (ref 0.50–1.35)
GFR, EST AFRICAN AMERICAN: 28 mL/min — AB (ref >90)
GFR, EST NON AFRICAN AMERICAN: 24 mL/min — AB (ref >90)
Glucose, Bld: 251 mg/dL — ABNORMAL HIGH (ref 70–99)
Potassium: 4.6 mmol/L (ref 3.5–5.1)
Sodium: 135 mmol/L (ref 135–145)
Total Bilirubin: 0.8 mg/dL (ref 0.3–1.2)
Total Protein: 6.1 g/dL (ref 6.0–8.3)

## 2014-07-19 LAB — CBC
HCT: 36.4 % — ABNORMAL LOW (ref 39.0–52.0)
HEMATOCRIT: 37.3 % — AB (ref 39.0–52.0)
Hemoglobin: 12.3 g/dL — ABNORMAL LOW (ref 13.0–17.0)
Hemoglobin: 12.1 g/dL — ABNORMAL LOW (ref 13.0–17.0)
MCH: 31.9 pg (ref 26.0–34.0)
MCH: 31.7 pg (ref 26.0–34.0)
MCHC: 33 g/dL (ref 30.0–36.0)
MCHC: 33.2 g/dL (ref 30.0–36.0)
MCV: 96.6 fL (ref 78.0–100.0)
MCV: 95.3 fL (ref 78.0–100.0)
PLATELETS: 138 10*3/uL — AB (ref 150–400)
Platelets: 155 10*3/uL (ref 150–400)
RBC: 3.86 MIL/uL — ABNORMAL LOW (ref 4.22–5.81)
RBC: 3.82 MIL/uL — ABNORMAL LOW (ref 4.22–5.81)
RDW: 14.2 % (ref 11.5–15.5)
RDW: 14 % (ref 11.5–15.5)
WBC: 9.6 10*3/uL (ref 4.0–10.5)
WBC: 9.5 10*3/uL (ref 4.0–10.5)

## 2014-07-19 LAB — HEMOGLOBIN AND HEMATOCRIT, BLOOD
HCT: 32.7 % — ABNORMAL LOW (ref 39.0–52.0)
Hemoglobin: 10.9 g/dL — ABNORMAL LOW (ref 13.0–17.0)

## 2014-07-19 LAB — CARBOXYHEMOGLOBIN
Carboxyhemoglobin: 0.5 % (ref 0.5–1.5)
Methemoglobin: 1.5 % (ref 0.0–1.5)
O2 SAT: 66.6 %
TOTAL HEMOGLOBIN: 13.1 g/dL — AB (ref 13.5–18.0)

## 2014-07-19 LAB — MRSA PCR SCREENING: MRSA by PCR: NEGATIVE

## 2014-07-19 SURGERY — REMOVAL, ELECTRODE LEAD, CARDIAC PACEMAKER, WITHOUT REPLACEMENT
Anesthesia: General | Site: Chest | Laterality: Right

## 2014-07-19 MED ORDER — EPINEPHRINE HCL 0.1 MG/ML IJ SOSY
PREFILLED_SYRINGE | INTRAMUSCULAR | Status: AC
  Filled 2014-07-19: qty 10

## 2014-07-19 MED ORDER — EPHEDRINE SULFATE 50 MG/ML IJ SOLN
INTRAMUSCULAR | Status: AC
  Filled 2014-07-19: qty 1

## 2014-07-19 MED ORDER — APIXABAN 2.5 MG PO TABS
2.5000 mg | ORAL_TABLET | Freq: Two times a day (BID) | ORAL | Status: DC
  Administered 2014-07-20 – 2014-07-21 (×4): 2.5 mg via ORAL
  Filled 2014-07-19 (×6): qty 1

## 2014-07-19 MED ORDER — PHENYLEPHRINE HCL 10 MG/ML IJ SOLN
10.0000 mg | INTRAVENOUS | Status: DC | PRN
  Administered 2014-07-19: 30 ug/min via INTRAVENOUS
  Administered 2014-07-19: 10:00:00 via INTRAVENOUS

## 2014-07-19 MED ORDER — ONDANSETRON HCL 4 MG/2ML IJ SOLN
4.0000 mg | Freq: Four times a day (QID) | INTRAMUSCULAR | Status: DC | PRN
Start: 2014-07-19 — End: 2014-07-27

## 2014-07-19 MED ORDER — LIDOCAINE HCL (PF) 1 % IJ SOLN
INTRAMUSCULAR | Status: DC | PRN
  Administered 2014-07-19: 30 mL

## 2014-07-19 MED ORDER — ONDANSETRON HCL 4 MG/2ML IJ SOLN: 4.0000 mg | Freq: Once | INTRAMUSCULAR | Status: DC | PRN

## 2014-07-19 MED ORDER — PROPOFOL 10 MG/ML IV BOLUS
INTRAVENOUS | Status: DC | PRN
  Administered 2014-07-19: 40 mg via INTRAVENOUS
  Administered 2014-07-19: 20 mg via INTRAVENOUS
  Administered 2014-07-19: 30 mg via INTRAVENOUS
  Administered 2014-07-19: 80 mg via INTRAVENOUS

## 2014-07-19 MED ORDER — HEPARIN SODIUM (PORCINE) 5000 UNIT/ML IJ SOLN
INTRAMUSCULAR | Status: DC | PRN
  Administered 2014-07-19: 500 mL

## 2014-07-19 MED ORDER — SUCCINYLCHOLINE CHLORIDE 20 MG/ML IJ SOLN
INTRAMUSCULAR | Status: AC
  Filled 2014-07-19: qty 1

## 2014-07-19 MED ORDER — ARTIFICIAL TEARS OP OINT
TOPICAL_OINTMENT | OPHTHALMIC | Status: DC | PRN
  Administered 2014-07-19: 1 via OPHTHALMIC

## 2014-07-19 MED ORDER — FENTANYL CITRATE 0.05 MG/ML IJ SOLN
INTRAMUSCULAR | Status: DC | PRN
  Administered 2014-07-19: 50 ug via INTRAVENOUS
  Administered 2014-07-19: 100 ug via INTRAVENOUS

## 2014-07-19 MED ORDER — MIDAZOLAM HCL 2 MG/2ML IJ SOLN
INTRAMUSCULAR | Status: AC
  Filled 2014-07-19: qty 2

## 2014-07-19 MED ORDER — EPINEPHRINE HCL 1 MG/ML IJ SOLN
0.5000 ug/min | INTRAVENOUS | Status: DC
  Administered 2014-07-19: 10.667 ug/min via INTRAVENOUS
  Filled 2014-07-19: qty 4

## 2014-07-19 MED ORDER — AMIODARONE HCL 200 MG PO TABS
200.0000 mg | ORAL_TABLET | Freq: Every day | ORAL | Status: DC
Start: 2014-07-20 — End: 2014-07-27
  Administered 2014-07-20 – 2014-07-27 (×7): 200 mg via ORAL
  Filled 2014-07-19 (×8): qty 1

## 2014-07-19 MED ORDER — GLYCOPYRROLATE 0.2 MG/ML IJ SOLN
INTRAMUSCULAR | Status: DC | PRN
  Administered 2014-07-19: 0.2 mg via INTRAVENOUS

## 2014-07-19 MED ORDER — CARVEDILOL 12.5 MG PO TABS: 12.5000 mg | ORAL_TABLET | Freq: Two times a day (BID) | ORAL | Status: DC

## 2014-07-19 MED ORDER — NOREPINEPHRINE BITARTRATE 1 MG/ML IV SOLN
0.0000 ug/min | INTRAVENOUS | Status: DC
  Administered 2014-07-19: 5 ug/min via INTRAVENOUS
  Filled 2014-07-19: qty 4

## 2014-07-19 MED ORDER — ARTIFICIAL TEARS OP OINT
TOPICAL_OINTMENT | OPHTHALMIC | Status: AC
  Filled 2014-07-19: qty 3.5

## 2014-07-19 MED ORDER — SODIUM CHLORIDE 0.9 % IJ SOLN
INTRAMUSCULAR | Status: AC
Start: 2014-07-19 — End: 2014-07-20
  Filled 2014-07-19: qty 15

## 2014-07-19 MED ORDER — ISOSORBIDE MONONITRATE 15 MG HALF TABLET: 15.0000 mg | ORAL_TABLET | Freq: Every day | ORAL | Status: DC

## 2014-07-19 MED ORDER — HYDROMORPHONE HCL 1 MG/ML IJ SOLN: 0.2500 mg | INTRAMUSCULAR | Status: DC | PRN

## 2014-07-19 MED ORDER — ALBUMIN HUMAN 5 % IV SOLN
12.5000 g | Freq: Once | INTRAVENOUS | Status: AC
  Administered 2014-07-19: 12.5 g via INTRAVENOUS

## 2014-07-19 MED ORDER — SPIRONOLACTONE 25 MG PO TABS
25.0000 mg | ORAL_TABLET | Freq: Every day | ORAL | Status: DC
  Filled 2014-07-19: qty 1

## 2014-07-19 MED ORDER — DEXTROSE 5 % IV SOLN
0.0000 ug/min | INTRAVENOUS | Status: DC
  Administered 2014-07-19: 66.667 ug/min via INTRAVENOUS
  Filled 2014-07-19: qty 1

## 2014-07-19 MED ORDER — SODIUM CHLORIDE 0.9 % IV SOLN
INTRAVENOUS | Status: DC
Start: 2014-07-19 — End: 2014-07-19

## 2014-07-19 MED ORDER — ACETAMINOPHEN 325 MG PO TABS
325.0000 mg | ORAL_TABLET | ORAL | Status: DC | PRN
  Administered 2014-07-22: 650 mg via ORAL
  Filled 2014-07-19: qty 2

## 2014-07-19 MED ORDER — ROCURONIUM BROMIDE 100 MG/10ML IV SOLN
INTRAVENOUS | Status: DC | PRN
  Administered 2014-07-19: 30 mg via INTRAVENOUS

## 2014-07-19 MED ORDER — SODIUM CHLORIDE 0.9 % IV SOLN
INTRAVENOUS | Status: DC
  Administered 2014-07-19: 10 mL/h via INTRAVENOUS
  Administered 2014-07-24: 01:00:00 via INTRAVENOUS

## 2014-07-19 MED ORDER — PHENYLEPHRINE 40 MCG/ML (10ML) SYRINGE FOR IV PUSH (FOR BLOOD PRESSURE SUPPORT)
PREFILLED_SYRINGE | INTRAVENOUS | Status: AC
  Filled 2014-07-19: qty 10

## 2014-07-19 MED ORDER — VITAMIN E 180 MG (400 UNIT) PO CAPS
400.0000 [IU] | ORAL_CAPSULE | Freq: Every day | ORAL | Status: DC
  Administered 2014-07-19 – 2014-07-27 (×8): 400 [IU] via ORAL
  Filled 2014-07-19 (×9): qty 1

## 2014-07-19 MED ORDER — ATROPINE SULFATE 0.1 MG/ML IJ SOLN
INTRAMUSCULAR | Status: AC
  Filled 2014-07-19: qty 10

## 2014-07-19 MED ORDER — SODIUM CHLORIDE 0.9 % IJ SOLN
INTRAMUSCULAR | Status: AC
  Filled 2014-07-19: qty 10

## 2014-07-19 MED ORDER — ONDANSETRON HCL 4 MG/2ML IJ SOLN
INTRAMUSCULAR | Status: DC | PRN
  Administered 2014-07-19: 4 mg via INTRAVENOUS

## 2014-07-19 MED ORDER — LACTATED RINGERS IV SOLN
INTRAVENOUS | Status: DC | PRN
  Administered 2014-07-19 (×3): via INTRAVENOUS

## 2014-07-19 MED ORDER — LIDOCAINE HCL (CARDIAC) 20 MG/ML IV SOLN
INTRAVENOUS | Status: AC
  Filled 2014-07-19: qty 5

## 2014-07-19 MED ORDER — PROPOFOL 10 MG/ML IV BOLUS
INTRAVENOUS | Status: AC
  Filled 2014-07-19: qty 20

## 2014-07-19 MED ORDER — ALBUMIN HUMAN 5 % IV SOLN
INTRAVENOUS | Status: AC
  Filled 2014-07-19: qty 250

## 2014-07-19 MED ORDER — NEOSTIGMINE METHYLSULFATE 10 MG/10ML IV SOLN
INTRAVENOUS | Status: DC | PRN
Start: 2014-07-19 — End: 2014-07-19
  Administered 2014-07-19: 2 mg via INTRAVENOUS

## 2014-07-19 MED ORDER — ROCURONIUM BROMIDE 50 MG/5ML IV SOLN
INTRAVENOUS | Status: AC
  Filled 2014-07-19: qty 1

## 2014-07-19 MED ORDER — FENTANYL CITRATE 0.05 MG/ML IJ SOLN
INTRAMUSCULAR | Status: AC
  Filled 2014-07-19: qty 5

## 2014-07-19 MED ORDER — SODIUM CHLORIDE 0.9 % IR SOLN
Status: DC | PRN
  Administered 2014-07-19: 500 mL

## 2014-07-19 MED ORDER — CEFAZOLIN SODIUM 1-5 GM-% IV SOLN
1.0000 g | Freq: Four times a day (QID) | INTRAVENOUS | Status: AC
  Administered 2014-07-19 – 2014-07-20 (×3): 1 g via INTRAVENOUS
  Filled 2014-07-19 (×3): qty 50

## 2014-07-19 MED ORDER — LIDOCAINE HCL (CARDIAC) 20 MG/ML IV SOLN
INTRAVENOUS | Status: DC | PRN
  Administered 2014-07-19: 40 mg via INTRAVENOUS

## 2014-07-19 MED ORDER — LIDOCAINE HCL (PF) 1 % IJ SOLN
INTRAMUSCULAR | Status: AC
  Filled 2014-07-19: qty 30

## 2014-07-19 MED FILL — Medication: Qty: 1 | Status: AC

## 2014-07-19 SURGICAL SUPPLY — 40 items
BAG BANDED W/RUBBER/TAPE 36X54 (MISCELLANEOUS) ×4 IMPLANT
BLADE 10 SAFETY STRL DISP (BLADE) ×4 IMPLANT
BLADE OSCILLATING /SAGITTAL (BLADE) IMPLANT
BLADE STERNUM SYSTEM 6 (BLADE) ×4 IMPLANT
BNDG COHESIVE 4X5 WHT NS (GAUZE/BANDAGES/DRESSINGS) IMPLANT
CANISTER SUCTION 2500CC (MISCELLANEOUS) ×4 IMPLANT
CATH S G BIP PACING (SET/KITS/TRAYS/PACK) IMPLANT
COVER TABLE BACK 60X90 (DRAPES) ×4 IMPLANT
DRAPE CARDIOVASCULAR INCISE (DRAPES) ×3
DRAPE SRG 135X102X78XABS (DRAPES) ×2 IMPLANT
DRSG OPSITE 6X11 MED (GAUZE/BANDAGES/DRESSINGS) IMPLANT
DRSG TEGADERM 4X4.75 (GAUZE/BANDAGES/DRESSINGS) ×4 IMPLANT
ELECT REM PT RETURN 9FT ADLT (ELECTROSURGICAL) ×8
ELECTRODE REM PT RTRN 9FT ADLT (ELECTROSURGICAL) ×4 IMPLANT
GAUZE SPONGE 4X4 12PLY STRL (GAUZE/BANDAGES/DRESSINGS) ×4 IMPLANT
GAUZE SPONGE 4X4 16PLY XRAY LF (GAUZE/BANDAGES/DRESSINGS) IMPLANT
GLOVE BIOGEL PI IND STRL 6.5 (GLOVE) ×4 IMPLANT
GLOVE BIOGEL PI IND STRL 7.5 (GLOVE) ×6 IMPLANT
GLOVE BIOGEL PI INDICATOR 6.5 (GLOVE) ×4
GLOVE BIOGEL PI INDICATOR 7.5 (GLOVE) ×6
GLOVE ECLIPSE 8.0 STRL XLNG CF (GLOVE) ×4 IMPLANT
GOWN STRL REUS W/ TWL LRG LVL3 (GOWN DISPOSABLE) IMPLANT
GOWN STRL REUS W/ TWL XL LVL3 (GOWN DISPOSABLE) ×2 IMPLANT
GOWN STRL REUS W/TWL LRG LVL3 (GOWN DISPOSABLE)
GOWN STRL REUS W/TWL XL LVL3 (GOWN DISPOSABLE) ×2
KIT ROOM TURNOVER OR (KITS) ×4 IMPLANT
LEAD CAPSURE NOVUS 5076-58CM (Lead) ×4 IMPLANT
PAD ARMBOARD 7.5X6 YLW CONV (MISCELLANEOUS) ×8 IMPLANT
PAD ELECT DEFIB RADIOL ZOLL (MISCELLANEOUS) ×4 IMPLANT
SHEATH COOK PEEL AWAY SET 7F (SHEATH) ×4 IMPLANT
SHEATH EVOLUTION RL 9F (SHEATH) ×4 IMPLANT
SNARE NEEDLE EYE 13X54 WRKSTAT (CATHETERS) ×4 IMPLANT
STYLET LIBERATOR LOCKING (MISCELLANEOUS) ×4 IMPLANT
SUT PROLENE 2 0 SH DA (SUTURE) ×8 IMPLANT
TOWEL OR 17X24 6PK STRL BLUE (TOWEL DISPOSABLE) ×8 IMPLANT
TOWEL OR 17X26 10 PK STRL BLUE (TOWEL DISPOSABLE) ×8 IMPLANT
TRAY FOLEY IC TEMP SENS 14FR (CATHETERS) ×8 IMPLANT
TUBE CONNECTING 12'X1/4 (SUCTIONS)
TUBE CONNECTING 12X1/4 (SUCTIONS) IMPLANT
YANKAUER SUCT BULB TIP NO VENT (SUCTIONS) IMPLANT

## 2014-07-19 NOTE — H&P (View-Only) (Signed)
HPI Marcus [name] returns today for followup. He is a pleasant 81 yo man with complete heart block, s/p PPM insertion in 2004 and change out in 2014 and worsening heart failure with biv upgrade a year ago. His last procedure was complicated by the development of a hematoma which resolved. He noted drainage on the lateral aspect of his incision. He has not had fever or chills. No Known Allergies   Current Outpatient Prescriptions  Medication Sig Dispense Refill  . amiodarone (PACERONE) 200 MG tablet Take 1 tablet my mouth Mon-Thurs, and 1/2 tablet by mouth Fri-Sun. 90 tablet 3  . apixaban (ELIQUIS) 2.5 MG TABS tablet Take 1 tablet (2.5 mg total) by mouth 2 (two) times daily. 60 tablet 3  . Ascorbic Acid (VITAMIN C PO) Take 1 tablet by mouth daily.     Marland Kitchen CALCIUM PO Take 1 tablet by mouth daily.     . carvedilol (COREG) 12.5 MG tablet Take 1 tablet (12.5 mg total) by mouth 2 (two) times daily with a meal. 180 tablet 3  . Cholecalciferol (VITAMIN D PO) Take 1 capsule by mouth daily.     . Coenzyme Q10 (COQ10 PO) Take 1-3 tablets by mouth See admin instructions. Take 3 tablets by mouth in the morning, and 1 tablet by mouth in the evening.    . isosorbide mononitrate (IMDUR) 30 MG 24 hr tablet Take 0.5 tablets (15 mg total) by mouth daily. 45 tablet 3  . Multiple Vitamins-Minerals (PRESERVISION AREDS 2) CAPS Take by mouth.    . Omega-3 Fatty Acids (OMEGA-3 FISH OIL PO) Take 30 mLs by mouth daily.    Marland Kitchen spironolactone (ALDACTONE) 25 MG tablet TAKE ONE-HALF TABLET BY MOUTH ONCE DAILY 45 tablet 3  . torsemide (DEMADEX) 20 MG tablet FOR INSTRUCTIONS ON THE    PROPER DOSING OF YOUR      MEDICATION REFER TO YOUR   PARTICIPANT COUNSELING FORM 135 tablet 0  . VITAMIN E PO Take 1 capsule by mouth daily.      No current facility-administered medications for this visit.     Past Medical History  Diagnosis Date  . Coronary artery disease     a. s/p remote CABG x 2(VG->OM, LIMA->LAD;  b. Late 90's  s/p PCI of RCA;  c. 12/09 Cath/PCI: LM 17m, 70-80d (3.0x64mm Xience DES), LAD100p, LCX 80-90p (2.25x38mmTaxus Atom DES), RCA 100d (2.5x14mm Xience DES), VG->OM 100, LIMA->LAD nl, EF 30-35%.  . Hyperlipidemia   . Prostate cancer   . PAF (paroxysmal atrial fibrillation)   . Ischemic cardiomyopathy     a. EF 35-40% by echo 05/2012, b. EF 20-25%, akinesis and scarring of inferolateral, inferior and inferoseptal myocardium, mild AI, mod MR, LA mod dilated, RV mildly dialted and sys fx mildly reduced, RA mildly dilated (09/2013)   . Symptomatic bradycardia     a. 08/2003 s/p MDT Enpulse [id number] Dual chamber PPM ser # [id number] H.  . Atrial fibrillation   . CHF (congestive heart failure)     a. CRT-D (10/08/2013)  . Peripheral vascular disease     ROS:   All systems reviewed and negative except as noted in the HPI.   Past Surgical History  Procedure Laterality Date  . Insert / replace / remove pacemaker    . Doppler echocardiography  06/24/2002    EF 60-65%  . Tee without cardioversion  06/26/2012    Procedure: TRANSESOPHAGEAL ECHOCARDIOGRAM (TEE);  Surgeon: Lelon Perla, MD;  Location: [facility];  Service: Cardiovascular;  Laterality: [name];  . Cardioversion  06/26/2012    Procedure: CARDIOVERSION;  Surgeon: Lelon Perla, MD;  Location: [facility] ENDOSCOPY;  Service: Cardiovascular;  Laterality: [name];  . Appendectomy    . Angioplasty      stent placement  . Embolectomy Right 07/28/2013    Procedure: Right Popliteal and Tibial Embolectomy;  Surgeon: Angelia Mould, MD;  Location: [facility] OR;  Service: Vascular;  Laterality: Right;  . Coronary artery bypass graft  1990  . Permanent pacemaker generator change [name] 08/27/2012    Procedure: PERMANENT PACEMAKER GENERATOR CHANGE;  Surgeon: [name], MD;  Location: [facility] CATH LAB;  Service: Cardiovascular;  Laterality: [name];  . Bi-ventricular pacemaker upgrade [name] 10/08/2013    Procedure: BI-VENTRICULAR PACEMAKER UPGRADE;  Surgeon: [name], MD;   Location: [facility] CATH LAB;  Service: Cardiovascular;  Laterality: [name];     Family History  Problem Relation Age of Onset  . Hypertension Mother   . Colon cancer Mother   . Cancer Mother     Colon  . Alcohol abuse Father      History   Social History  . Marital Status: Married    Spouse Name: [name]  . Number of Children: 0  . Years of Education: [name]   Occupational History  . retired    Social History Main Topics  . Smoking status: Former Smoker    Quit date: 02/10/1971  . Smokeless tobacco: Never Used  . Alcohol Use: No  . Drug Use: No  . Sexual Activity: No   Other Topics Concern  . Not on file   Social History Narrative   Lives in [place] with wife.  Retired.     There were no vitals taken for this visit.  Physical Exam:  Well appearing 81 yo man, NAD HEENT: Unremarkable Neck:  No JVD, no thyromegally Back:  No CVA tenderness Lungs:  Clear with no wheezes, lateral aspect of his PPM incision is open and leads are easily visible.  HEART:  Regular rate rhythm, no murmurs, no rubs, no clicks Abd:  soft, positive bowel sounds, no organomegally, no rebound, no guarding Ext:  2 plus pulses, no edema, no cyanosis, no clubbing Skin:  No rashes no nodules Neuro:  CN II through XII intact, motor grossly intact    DEVICE  Normal device function.  See PaceArt for details. Underlying complete heart block.   Assess/Plan:

## 2014-07-19 NOTE — Transfer of Care (Signed)
Immediate Anesthesia Transfer of Care Note  Patient: Marcus [name]  Procedure(s) Performed: Procedure(s) with comments: PACEMAKER LEAD REMOVAL/EXTRACTION (Right) - DR. VAN TRIGT TO BACK UP CASE TEMPORARY PACEMAKER (Right)  Patient Location: PACU  Anesthesia Type:General  Level of Consciousness: awake, alert  and patient cooperative  Airway & Oxygen Therapy: Patient Spontanous Breathing  Post-op Assessment: Report given to RN, Post -op Vital signs reviewed and stable and Patient moving all extremities  Post vital signs: Reviewed and stable  Last Vitals:  Filed Vitals:   07/19/14 0552  BP: 105/83  Pulse: 119  Temp: 36.5 C  Resp: 20    Complications: No apparent anesthesia complications

## 2014-07-19 NOTE — OR Nursing (Signed)
Generator, RA lead, RV lead, and LV lead explanted.  Generator- Reference: [id number]        Serial Number: [id number]  RA Lead- Guidant- Reference: [id number]       Serial Number: [id number]  RV Lead- Guidant- Reference: [id number]       Serial Number: [id number]  LV Lead- Medronic- Reference: [id number]     Serial Number: [id number]

## 2014-07-19 NOTE — Progress Notes (Signed)
Anesthesiology:  81 year old male with complete heart block and h/o paroxysmal Afib. This morning he underwent removal of an infected biventricular pacing system under general anesthesia. The procedure was uneventful with removal of the pacing leads and insertion of a temporary uni-ventricular lead.  In the PACU, there was loss of capture of the temporary lead with an underlying escape rhythm of 20 bpm. CPR with chest compression was started for approximately 20-30 seconds. He was given 100 mcg of epinephrine with prompt BP and HR response. Dr. Lovena Le then reconnected the pacing lead with return of capture. He developed hypotension ( [id number] systolic BP) despite adequate pacing function.  Bedside echo by Dr. Lovena Le showed LV dysfunction with ventricular dyssynchrony and no pericardial fluid.  I then inserted a left internal jugular central line and started epinephrine and  neosynephrine infusions with stabilization of BP. He was subsequently transferred to [facility].  Patient is now on 2 H. He is awake and alert and feels well. Neurologically intact.  BP; T-36.8 110/74 HR-90  (VVI pacing) RR-15 O2 sat 100% on 2L O2  Now on norepinephrine at 3 mcg./min.  I appreciate CCM and cardiology assistance.  Roberts Gaudy

## 2014-07-19 NOTE — Progress Notes (Signed)
Epi 36mcg IV

## 2014-07-19 NOTE — Progress Notes (Signed)
Epinephrine given IV X2; see code sheet

## 2014-07-19 NOTE — Anesthesia Preprocedure Evaluation (Signed)
Anesthesia Evaluation  Patient identified by MRN, date of birth, ID band Patient awake    Reviewed: Allergy & Precautions, NPO status , Patient's Chart, lab work & pertinent test results  Airway Mallampati: II  TM Distance: >3 FB Neck ROM: Full    Dental  (+) Teeth Intact, Dental Advisory Given   Pulmonary former smoker,  breath sounds clear to auscultation        Cardiovascular hypertension, Rhythm:Regular Rate:Normal     Neuro/Psych    GI/Hepatic   Endo/Other    Renal/GU      Musculoskeletal   Abdominal   Peds  Hematology   Anesthesia Other Findings   Reproductive/Obstetrics                             Anesthesia Physical Anesthesia Plan  ASA: III  Anesthesia Plan: General   Post-op Pain Management:    Induction: Intravenous  Airway Management Planned: Oral ETT  Additional Equipment: Arterial line  Intra-op Plan:   Post-operative Plan: Extubation in OR  Informed Consent: I have reviewed the patients History and Physical, chart, labs and discussed the procedure including the risks, benefits and alternatives for the proposed anesthesia with the patient or authorized representative who has indicated his/her understanding and acceptance.   Dental advisory given  Plan Discussed with: CRNA and Anesthesiologist  Anesthesia Plan Comments:         Anesthesia Quick Evaluation

## 2014-07-19 NOTE — Progress Notes (Signed)
EP Attending  I was called to the bedside as patient's temporary permanent PM was not capturing. Transcutaneous pacing was commendced and after disconnecting the pm from generator, the temp perm lead was working normally. Reconnecting the lead and interogation demonstrated normal pacing. The blood pressure remained very low and a left IJ catheter was placed and neosynephrine and epinephrine begun. He remains on both with good pressures. A quick look 2D echo by me showed no significant pericardial effusion but very poor LV function. He is dysynchronously rv apical pacing. He has no pain but feels poorly. Exam demonstrates an ill appearing man with no respiratory distress. Incision is clean and dry. Extremities are cool.   Tele - ventricular pacing  A/P 1. Acute CHF 2. S/p Pacing system extraction with insertion of a temporary perm PM 3. Transient loss of capture, now resolved.  4. Known CAD with no angina  Rec: I have asked the CHF service to help support this patient. I am certain his dysychronous RV pacing is playing a role. Unclear if another problem is also exacerbating his condiition. Will obtain a 2D echo. Will review the cxr. If no other problems are seen, we will consider placing biv PM reinsertion sooner rather than later. His overall functional capacity improved markedly when he went from DDD pacing to BiV pacing.   Mikle Bosworth.D.

## 2014-07-19 NOTE — Interval H&P Note (Signed)
History and Physical Interval Note:  07/19/2014 8:00 AM  [name]  has presented today for surgery, with the diagnosis of PPM INFECTION  The various methods of treatment have been discussed with the patient and family. After consideration of risks, benefits and other options for treatment, the patient has consented to  Procedure(s) with comments: PACEMAKER LEAD REMOVAL/EXTRACTION (Right) - DR. VAN TRIGT TO BACK UP CASE as a surgical intervention .  The patient's history has been reviewed, patient examined, no change in status, stable for surgery.  I have reviewed the patient's chart and labs.  Questions were answered to the patient's satisfaction.     Mikle Bosworth.D.

## 2014-07-19 NOTE — Progress Notes (Signed)
Dr. Lovena Le @ bedside working w/ medtronic rep.

## 2014-07-19 NOTE — Addendum Note (Signed)
Addendum  created 07/19/14 2143 by Roberts Gaudy, MD   Modules edited: Anesthesia Attestations, Notes Section   Notes Section:  File: [id number]; Pend: [id number]; Raelyn Number: [id number]; Raelyn Number: [id number] Number: [id number] Number: [id number]; Pend: [id number]

## 2014-07-19 NOTE — Anesthesia Procedure Notes (Signed)
Procedure Name: Intubation Date/Time: 07/19/2014 7:42 AM Performed by: Julian Reil Pre-anesthesia Checklist: Patient identified, Emergency Drugs available, Suction available and Patient being monitored Patient Re-evaluated:Patient Re-evaluated prior to inductionOxygen Delivery Method: Circle system utilized Preoxygenation: Pre-oxygenation with 100% oxygen Intubation Type: IV induction Ventilation: Mask ventilation without difficulty Laryngoscope Size: Mac and 4 Grade View: Grade I Tube type: Oral Tube size: 8.0 mm Number of attempts: 1 Placement Confirmation: ETT inserted through vocal cords under direct vision,  positive ETCO2 and breath sounds checked- equal and bilateral Secured at: 22 cm Tube secured with: Tape Dental Injury: Teeth and Oropharynx as per pre-operative assessment

## 2014-07-19 NOTE — CV Procedure (Signed)
Electrophysiology procedure note  Preoperative diagnosis: Infected biventricular pacing system in the setting of complete heart block  Postoperative diagnosis: Same as preoperative diagnosis  Description of the procedure: After informed consent was obtained, the patient was taken to the operating room in the fasting state. After the usual preparation and draping, general anesthesia was utilized for sedation. Invasive arterial monitoring was utilized. After the appropriate time outs, the right internal jugular vein was punctured and a 7 [place] active fixation pacing lead, Medtronic 5076 was advanced under fluoroscopic guidance to the right ventricular apex where the lead was actively fixed. The pacing threshold was 1/2 V at 0.4 ms and the pacing impedance was initially 1000 ohms. R waves, paced measured 15 mV. At this point, the right femoral vein was punctured and a 6 French sheath placed in the vein. Next attention was turned to the infected pacemaker pocket. The skin was broken. A wide incision was made and electrocautery utilized to free up the fibrous adhesions. The pacemaker generator was removed. Attention was first turned to removal of the left ventricular pacing lead. An angioplasty 014 guidewire was inserted into the Medtronic left ventricular pacing lead. The pacing lead was then removed under general traction. Next attention was turned towards the atrial lead. This was a Engineer, materials. A locking stylette was inserted into the lead body and attempts were made to extract the lead using a [place] extraction sheath. Unfortunately, the fibrous scar tissue was stronger than the lead itself and the lead began to unravel as tension was placed on the lead while we attempted to get past a very dense fibrous binding site at the junction of the subclavian vein and superior vena cava. At this point, attempts to extract the pacing lead were abandoned from above. A Cook 11 [place]  workstation was inserted over a guidewire by way of the right femoral vein into the inferior vena cava. The bird snare was then advanced around the atrial lead and the lead was retracted but could not initially be removed as the snare slipped. At this point after approximately 20 minutes of being unable to re-snare the atrial lead, a 25 mm hours I snare was used to snare the atrial pacing lead. Using gentle traction, the atrial lead was was drawn into the workstation and extracted in total. Next the bird snare was utilized to extract the old right ventricular pacing lead. This was carried out without complication. At this point the pocket was debrided and irrigated. Necrotic skin was excised. The incision was closed with multiple Prolene mattress sutures. The temporary pacing lead was hooked up to the patient's old pacemaker and secured to the skin. Dressings were placed, and the patient was returned to the recovery area in satisfactory condition. Fluoroscopy demonstrated no evidence of any residual lead fragments. Prior to return to the recovery area, the patient 63 French sheath was removed and hemostasis obtained.  Complications: There were no immediate procedure complications  Conclusion: Successful insertion of a temporary permanent transvenous pacemaker, successful extraction of an 79 year old dual-chamber pacing system and a 68-year-old left ventricular pacing lead.  Cristopher Peru, M.D.

## 2014-07-19 NOTE — Progress Notes (Signed)
[place] Progress Note Patient Name: Marcus [name] DOB: 09-03-1928 MRN: [id number]   Date of Service  07/19/2014  HPI/Events of Note  Reviewed in full and camera in  No distress, paced well now Neo off Epi at 3, sys 100  eICU Interventions  D/w Rn , epi titration, if unable to dc will re assess hemodynamic plan      Intervention Category Evaluation Type: New Patient Evaluation  Raylene Miyamoto. 07/19/2014, 5:38 PM

## 2014-07-19 NOTE — Anesthesia Postprocedure Evaluation (Signed)
  Anesthesia Post-op Note  Patient: Marcus [name]  Procedure(s) Performed: Procedure(s) with comments: PACEMAKER LEAD REMOVAL/EXTRACTION (Right) - DR. VAN TRIGT TO BACK UP CASE TEMPORARY PACEMAKER (Right)  Patient Location: PACU  Anesthesia Type: General   Level of Consciousness: awake, alert  and oriented  Airway and Oxygen Therapy: Patient Spontanous Breathing  Post-op Pain: mild  Post-op Assessment: Post-op Vital signs reviewed  Post-op Vital Signs: Reviewed  Last Vitals:  Filed Vitals:   07/19/14 1630  BP: 94/72  Pulse: 88  Temp:   Resp: 10    Complications: No apparent anesthesia complications

## 2014-07-19 NOTE — Progress Notes (Signed)
Neo gtt infusing upon arrival to pacu

## 2014-07-19 NOTE — Consult Note (Signed)
Advanced Heart Failure Team Consult Note  Referring Physician: Dr Lovena Le  Primary Physician: Primary Cardiologist: Dr Aundra Dubin  Reason for Consultation: Hypotension  HPI:   Marcus [name] is an 81 year old with a history of complete heart block, S/P PPM 2004 with change out 2014, BiV upgrade 2015, paroxysmal atrial fibrillation, CAD s/p CABG and PCIs. On February 17th he had exudate from pacer site and saw Dr Lovena Le with plans for removal today He was not placed on antibiotics.    He presented today for removal PPM due to infection and insertion of temporary permanent transvenous pacemaker. This was complicated by temporary pacer not capturing pacer and lead was reconnected and demonstrated normal pacing. Also has dysnchronous RV pacing. A bed side ECHO performed with no evidence pericardial effusion. CXR performed and showed probable atelectasis. Due to hypotension a central line was placed and he was started on epinephrine and neo synephrine.     Denies SOB/CP    Review of Systems: [y] = yes, [ ]  = no   General: Weight gain [ ] ; Weight loss [ ] ; Anorexia [ ] ; Fatigue [ Y]; Fever [ ] ; Chills [ ] ; Weakness [Y ]  Cardiac: Chest pain/pressure [ ] ; Resting SOB [ ] ; Exertional SOB [ ] ; Orthopnea [ ] ; Pedal Edema [ ] ; Palpitations [ ] ; Syncope [ ] ; Presyncope [ ] ; Paroxysmal nocturnal dyspnea[ ]   Pulmonary: Cough [ ] ; Wheezing[ ] ; Hemoptysis[ ] ; Sputum [ ] ; Snoring [ ]   GI: Vomiting[ ] ; Dysphagia[ ] ; Melena[ ] ; Hematochezia [ ] ; Heartburn[ ] ; Abdominal pain [ ] ; Constipation [ ] ; Diarrhea [ ] ; BRBPR [ ]   GU: Hematuria[ ] ; Dysuria [ ] ; Nocturia[ ]   Vascular: Pain in legs with walking [ ] ; Pain in feet with lying flat [ ] ; Non-healing sores [ ] ; Stroke [ ] ; TIA [ ] ; Slurred speech [ ] ;  Neuro: Headaches[ ] ; Vertigo[ ] ; Seizures[ ] ; Paresthesias[ ] ;Blurred vision [ ] ; Diplopia [ ] ; Vision changes [ ]   Ortho/Skin: Arthritis [ ] ; Joint pain [ ] ; Muscle pain [ ] ; Joint swelling [ ] ; Back Pain [ ] ; Rash [ ]    Psych: Depression[ ] ; Anxiety[ ]   Heme: Bleeding problems [ ] ; Clotting disorders [ ] ; Anemia [ ]   Endocrine: Diabetes [ ] ; Thyroid dysfunction[ ]   Home Medications Prior to Admission medications   Medication Sig Start Date End Date Taking? Authorizing Provider  amiodarone (PACERONE) 200 MG tablet Take 1 tablet my mouth Mon-Thurs, and 1/2 tablet by mouth Fri-Sun. 02/08/14  Yes Rande Brunt, NP  apixaban (ELIQUIS) 2.5 MG TABS tablet Take 1 tablet (2.5 mg total) by mouth 2 (two) times daily. 06/29/14  Yes [name], MD  Ascorbic Acid (VITAMIN C) 1000 MG tablet Take 1,000 mg by mouth daily.   Yes Historical Provider, MD  carvedilol (COREG) 12.5 MG tablet Take 1 tablet (12.5 mg total) by mouth 2 (two) times daily with a meal. 05/13/14  Yes Jolaine Artist, MD  Cholecalciferol (VITAMIN D3) 5000 UNITS CAPS Take 1 capsule by mouth daily.   Yes Historical Provider, MD  Coenzyme Q10 (CO Q 10) 100 MG CAPS Take 1 capsule by mouth 2 (two) times daily.   Yes Historical Provider, MD  isosorbide mononitrate (IMDUR) 30 MG 24 hr tablet Take 0.5 tablets (15 mg total) by mouth daily. 02/08/14  Yes Rande Brunt, NP  Multiple Vitamins-Minerals (PRESERVISION AREDS 2) CAPS Take 1 capsule by mouth 2 (two) times daily.    Yes Historical Provider, MD  Omega-3 Fatty  Acids (OMEGA-3 FISH OIL PO) Take 15 mLs by mouth daily.    Yes Historical Provider, MD  spironolactone (ALDACTONE) 25 MG tablet TAKE ONE-HALF TABLET BY MOUTH ONCE DAILY 02/08/14  Yes Rande Brunt, NP  torsemide (DEMADEX) 20 MG tablet FOR INSTRUCTIONS ON THE    PROPER DOSING OF YOUR      MEDICATION REFER TO YOUR   PARTICIPANT COUNSELING FORM Patient taking differently: daily 05/20/14  Yes [name], MD  vitamin E 400 UNIT capsule Take 400 Units by mouth daily.   Yes Historical Provider, MD  Ascorbic Acid (VITAMIN C PO) Take 1 tablet by mouth daily.     Historical Provider, MD  CALCIUM PO Take 1 tablet by mouth daily.     Historical Provider,  MD  Cholecalciferol (VITAMIN D PO) Take 1 capsule by mouth daily.     Historical Provider, MD  Coenzyme Q10 (COQ10 PO) Take 1-3 tablets by mouth See admin instructions. Take 3 tablets by mouth in the morning, and 1 tablet by mouth in the evening.    Historical Provider, MD  VITAMIN E PO Take 1 capsule by mouth daily.     Historical Provider, MD    Past Medical History: Past Medical History  Diagnosis Date  . Coronary artery disease     a. s/p remote CABG x 2(VG->OM, LIMA->LAD;  b. Late 90's s/p PCI of RCA;  c. 12/09 Cath/PCI: LM 91m, 70-80d (3.0x63mm Xience DES), LAD100p, LCX 80-90p (2.25x6mmTaxus Atom DES), RCA 100d (2.5x68mm Xience DES), VG->OM 100, LIMA->LAD nl, EF 30-35%.  . Hyperlipidemia     hx of but states he has never been on meds  . Prostate cancer   . PAF (paroxysmal atrial fibrillation)     takes Amiodarone daily as well as Carvedilol  . Ischemic cardiomyopathy     takes Imdur daily  . Symptomatic bradycardia     a. 08/2003 s/p MDT Enpulse [id number] Dual chamber PPM ser # [id number] H.  . Atrial fibrillation   . Peripheral vascular disease   . CHF (congestive heart failure)     takes Torsemide daily  . Hard of hearing   . Hypertension     takes Imdur daily  . Shortness of breath dyspnea     with exertion occasionally  . Unsteady gait   . Dizziness   . History of gout   . History of gastric ulcer   . History of colon polyps   . Cataracts, bilateral   . Macular degeneration, dry   . History of shingles   . CKD (chronic kidney disease)     Past Surgical History: Past Surgical History  Procedure Laterality Date  . Doppler echocardiography  06/24/2002    EF 60-65%  . Tee without cardioversion  06/26/2012    Procedure: TRANSESOPHAGEAL ECHOCARDIOGRAM (TEE);  Surgeon: Lelon Perla, MD;  Location: [facility] ENDOSCOPY;  Service: Cardiovascular;  Laterality: [name];  . Cardioversion  06/26/2012    Procedure: CARDIOVERSION;  Surgeon: Lelon Perla, MD;  Location: [place];   Service: Cardiovascular;  Laterality: [name];  . Appendectomy    . Angioplasty      stent placement  . Embolectomy Right 07/28/2013    Procedure: Right Popliteal and Tibial Embolectomy;  Surgeon: Angelia Mould, MD;  Location: [place];  Service: Vascular;  Laterality: Right;  . Permanent pacemaker generator change [name] 08/27/2012    Procedure: PERMANENT PACEMAKER GENERATOR CHANGE;  Surgeon: [name], MD;  Location: [facility] CATH LAB;  Service: Cardiovascular;  Laterality: [name];  . Bi-ventricular pacemaker upgrade [name] 10/08/2013    Procedure: BI-VENTRICULAR PACEMAKER UPGRADE;  Surgeon: [name], MD;  Location: [facility] CATH LAB;  Service: Cardiovascular;  Laterality: [name];  . Cardiac catheterization  most recent in 2009    several  . Coronary artery bypass graft  1990    left main  . Insert / replace / remove pacemaker      1st pacemaker placed 10+yrs ago  . Colonoscopy    . Coronary angioplasty      couple     Family History: Family History  Problem Relation Age of Onset  . Hypertension Mother   . Colon cancer Mother   . Cancer Mother     Colon  . Alcohol abuse Father     Social History: History   Social History  . Marital Status: Married    Spouse Name: [name]  . Number of Children: 0  . Years of Education: [name]   Occupational History  . retired    Social History Main Topics  . Smoking status: Former Smoker    Quit date: 02/10/1971  . Smokeless tobacco: Never Used     Comment: quit smoking 16yrs ago  . Alcohol Use: No     Comment: nothing in 31yrs   . Drug Use: No  . Sexual Activity: No   Other Topics Concern  . None   Social History Narrative   Lives in [place] with wife.  Retired.    Allergies:  No Known Allergies  Objective:    Vital Signs:   Temp:  [97.7 F (36.5 C)-98.3 F (36.8 C)] 98.3 F (36.8 C) (02/29 1445) Pulse Rate:  [46-119] 88 (02/29 1520) Resp:  [6-22] 18 (02/29 1520) BP: (83-123)/(51-94) 116/77 mmHg (02/29 1450) SpO2:  [97 %-100 %] 100 %  (02/29 1520) Arterial Line BP: (78-233)/(25-128) 125/70 mmHg (02/29 1520) Weight:  [174 lb (78.926 kg)] 174 lb (78.926 kg) (02/29 0552)    Weight change: Filed Weights   07/19/14 0552  Weight: 174 lb (78.926 kg)    Intake/Output:   Intake/Output Summary (Last 24 hours) at 07/19/14 1601 Last data filed at 07/19/14 1510  Gross per 24 hour  Intake   1600 ml  Output    835 ml  Net    765 ml     Physical Exam: General:  Chronically ill appearing. No resp difficulty HEENT: normal Neck: supple. JVP 6-7 . Carotids 2+ bilat; no bruits. No lymphadenopathy or thryomegaly appreciated. L tripe lumen central line.  Cor: PMI nondisplaced. Regular rate & rhythm. No rubs, gallops or murmurs. Temporary Pacer in place Lungs: Decreased breath sounds. On 2 liters [place].  Abdomen: soft, nontender, nondistended. No hepatosplenomegaly. No bruits or masses. Good bowel sounds. Extremities: no cyanosis, clubbing, rash, edema Neuro: alert & orientedx3, cranial nerves grossly intact. moves all 4 extremities w/o difficulty. Affect pleasant GU: Foley   Telemetry: V paced 90  Labs: Basic Metabolic Panel:  Recent Labs Lab 07/14/14 1408 07/14/14 1554 07/19/14 1355  NA 137 137 137  K 4.4 4.4 4.0  CL 101 102 107  CO2 30 25 21   GLUCOSE 94 96 220*  BUN 89* 86* 79*  CREATININE 2.46* 2.50* 2.20*  CALCIUM 9.7 9.5 8.3*    Liver Function Tests: No results for input(s): AST, ALT, ALKPHOS, BILITOT, PROT, ALBUMIN in the last 168 hours. No results for input(s): LIPASE, AMYLASE in the last 168 hours. No results for input(s): AMMONIA in the last 168 hours.  CBC:  Recent Labs Lab 07/14/14 1408 07/14/14 1554 07/19/14 1230 07/19/14 1355  WBC 5.6 5.0  --  9.6  NEUTROABS 3.8  --   --   --   HGB 13.4 13.5 10.9* 12.3*  HCT 40.0 40.6 32.7* 37.3*  MCV 95.6 95.5  --  96.6  PLT 145.0* 138*  --  138*    Cardiac Enzymes: No results for input(s): CKTOTAL, CKMB, CKMBINDEX, TROPONINI in the last 168  hours.  BNP: BNP (last 3 results) No results for input(s): BNP in the last 8760 hours.  ProBNP (last 3 results)  Recent Labs  10/06/13 1548 10/28/13 1216 11/25/13 1434  PROBNP 6552.0* 1728.0* 2528.0*     CBG: No results for input(s): GLUCAP in the last 168 hours.  Coagulation Studies: No results for input(s): LABPROT, INR in the last 72 hours.  Other results: EKG:   Imaging: Dg Chest Port 1 View  07/19/2014   CLINICAL DATA:  Pacemaker lead removal/extraction, central line placement  EXAM: PORTABLE CHEST - 1 VIEW  COMPARISON:  03/24/2014  FINDINGS: Right-sided pacer in place with single lead terminating over the expected location of the right ventricle. Left IJ central venous catheter tip terminates at the brachiocephalic/SVC junction. Evidence of median sternotomy. Heart size at upper limits of normal. Lungs are hypoaerated with crowding of the bronchovascular markings and curvilinear bibasilar atelectasis. Trace bilateral pleural effusions are present. No acute osseous abnormality. No pneumothorax.  IMPRESSION: Low volumes with probable bibasilar atelectasis. Consider PA and lateral chest radiographs obtained at full inspiration when the patient is clinically able.  Left IJ central line tip terminating at the brachiocephalic/ SVC junction.   Electronically Signed   By: Conchita Paris M.D.   On: 07/19/2014 14:51     Medications:     Current Medications: . albumin human      . [START ON 07/20/2014] amiodarone  200 mg Oral Daily  . [START ON 07/20/2014] apixaban  2.5 mg Oral BID  . atropine      .  ceFAZolin (ANCEF) IV  1 g Intravenous Q6H  . EPINEPHrine      . [START ON 07/20/2014] isosorbide mononitrate  15 mg Oral Daily  . sodium chloride      . vitamin E  400 Units Oral Daily    Infusions: . phenylephrine (NEO-SYNEPHRINE) Adult infusion 45 mcg/min (07/19/14 1548)     Assessment/Plan    1. Infected Biventricular Pacing System- S/P removal BiV removal  On ancef.  Check Blood cultures x2. 2. Hypotension Wean epi and neo. If needed will place on Levo. Hold bb and spiro. Keep A-line for now. Check CVP and CO-OX. Hold diuretics for now.  3. Acute/Chronic Systolic HF Hold BB, spiro, and imdur due to hypotension. Hold diuretics.  4. PAF- V paced. On amiodarone 200 mg daily. Restart eliquis 2.5 mg twice a day tomorrow.  5. CKD- renal function ok. Follow renal function closely.   Length of Stay:   CLEGG,AMY NP-C  07/19/2014, 4:01 PM  Advanced Heart Failure Team Pager [phone] (M-F; [place])  Please contact [facility] for night-coverage after hours (4p -7a ) and weekends on [email or web address]  Patient seen with NP, agree with the above note.  1. CRT-D device pocket infection: s/p explantation.  Has temporary-permanent device now.  He is on Ancef, will send blood cultures.   2. Hypotensive: Post-explantation.  He has lost BiV pacing, dyssynchronous RV pacing + sedation may play a role in this. .  - Titrating down epinephrine and  phenylephrine gtts.  Keep MAP 60 or above.  If these cannot be titrated off, would transition over to norepinephrine.  - Follow fever curve and cultures, do not suspect septic shock at this point.  - Will send co-ox from central line and follow CVP.  - Hold beta blocker, Imdur, and torsemide for now.  3. Chronic systolic CHF: Ischemic cardiomyopathy. Volume looks ok.  As above,will be following co-ox and CVP.  4. Atrial fibrillation: Paroxysmal.  Continue amiodarone 200 mg daily. Current rhythm by ECG is difficult, v-paced but unsure if NSR with long 1st degree AV block. Hold Eliquis today, if stable restart 2.5 mg bid tomorrow.   [name] 07/19/2014 4:11 PM

## 2014-07-20 ENCOUNTER — Observation Stay (HOSPITAL_COMMUNITY): Payer: Medicare Other

## 2014-07-20 ENCOUNTER — Encounter (HOSPITAL_COMMUNITY): Payer: Self-pay | Admitting: Internal Medicine

## 2014-07-20 DIAGNOSIS — Z79899 Other long term (current) drug therapy: Secondary | ICD-10-CM | POA: Diagnosis not present

## 2014-07-20 DIAGNOSIS — N179 Acute kidney failure, unspecified: Secondary | ICD-10-CM | POA: Diagnosis not present

## 2014-07-20 DIAGNOSIS — N189 Chronic kidney disease, unspecified: Secondary | ICD-10-CM | POA: Diagnosis not present

## 2014-07-20 DIAGNOSIS — N183 Chronic kidney disease, stage 3 (moderate): Secondary | ICD-10-CM | POA: Diagnosis present

## 2014-07-20 DIAGNOSIS — I959 Hypotension, unspecified: Secondary | ICD-10-CM

## 2014-07-20 DIAGNOSIS — Z950 Presence of cardiac pacemaker: Secondary | ICD-10-CM | POA: Diagnosis not present

## 2014-07-20 DIAGNOSIS — Z452 Encounter for adjustment and management of vascular access device: Secondary | ICD-10-CM | POA: Diagnosis not present

## 2014-07-20 DIAGNOSIS — I480 Paroxysmal atrial fibrillation: Secondary | ICD-10-CM | POA: Diagnosis present

## 2014-07-20 DIAGNOSIS — I340 Nonrheumatic mitral (valve) insufficiency: Secondary | ICD-10-CM | POA: Diagnosis not present

## 2014-07-20 DIAGNOSIS — I255 Ischemic cardiomyopathy: Secondary | ICD-10-CM | POA: Diagnosis present

## 2014-07-20 DIAGNOSIS — I129 Hypertensive chronic kidney disease with stage 1 through stage 4 chronic kidney disease, or unspecified chronic kidney disease: Secondary | ICD-10-CM | POA: Diagnosis present

## 2014-07-20 DIAGNOSIS — R570 Cardiogenic shock: Secondary | ICD-10-CM | POA: Diagnosis not present

## 2014-07-20 DIAGNOSIS — I442 Atrioventricular block, complete: Secondary | ICD-10-CM | POA: Diagnosis not present

## 2014-07-20 DIAGNOSIS — Z951 Presence of aortocoronary bypass graft: Secondary | ICD-10-CM | POA: Diagnosis not present

## 2014-07-20 DIAGNOSIS — Z87891 Personal history of nicotine dependence: Secondary | ICD-10-CM | POA: Diagnosis not present

## 2014-07-20 DIAGNOSIS — Z955 Presence of coronary angioplasty implant and graft: Secondary | ICD-10-CM | POA: Diagnosis not present

## 2014-07-20 DIAGNOSIS — I5023 Acute on chronic systolic (congestive) heart failure: Secondary | ICD-10-CM | POA: Diagnosis not present

## 2014-07-20 DIAGNOSIS — I5022 Chronic systolic (congestive) heart failure: Secondary | ICD-10-CM | POA: Diagnosis not present

## 2014-07-20 DIAGNOSIS — I509 Heart failure, unspecified: Secondary | ICD-10-CM | POA: Diagnosis not present

## 2014-07-20 DIAGNOSIS — Y831 Surgical operation with implant of artificial internal device as the cause of abnormal reaction of the patient, or of later complication, without mention of misadventure at the time of the procedure: Secondary | ICD-10-CM | POA: Diagnosis present

## 2014-07-20 DIAGNOSIS — Z8546 Personal history of malignant neoplasm of prostate: Secondary | ICD-10-CM | POA: Diagnosis not present

## 2014-07-20 DIAGNOSIS — I2510 Atherosclerotic heart disease of native coronary artery without angina pectoris: Secondary | ICD-10-CM | POA: Diagnosis present

## 2014-07-20 DIAGNOSIS — H9190 Unspecified hearing loss, unspecified ear: Secondary | ICD-10-CM | POA: Diagnosis present

## 2014-07-20 DIAGNOSIS — Z7901 Long term (current) use of anticoagulants: Secondary | ICD-10-CM | POA: Diagnosis not present

## 2014-07-20 DIAGNOSIS — T827XXA Infection and inflammatory reaction due to other cardiac and vascular devices, implants and grafts, initial encounter: Secondary | ICD-10-CM | POA: Diagnosis not present

## 2014-07-20 DIAGNOSIS — I4892 Unspecified atrial flutter: Secondary | ICD-10-CM | POA: Diagnosis not present

## 2014-07-20 LAB — BASIC METABOLIC PANEL
Anion gap: 8 (ref 5–15)
BUN: 57 mg/dL — ABNORMAL HIGH (ref 6–23)
CHLORIDE: 108 mmol/L (ref 96–112)
CO2: 26 mmol/L (ref 19–32)
Calcium: 8.2 mg/dL — ABNORMAL LOW (ref 8.4–10.5)
Creatinine, Ser: 1.86 mg/dL — ABNORMAL HIGH (ref 0.50–1.35)
GFR calc Af Amer: 37 mL/min — ABNORMAL LOW (ref >90)
GFR calc non Af Amer: 32 mL/min — ABNORMAL LOW (ref >90)
GLUCOSE: 101 mg/dL — AB (ref 70–99)
POTASSIUM: 4.3 mmol/L (ref 3.5–5.1)
SODIUM: 142 mmol/L (ref 135–145)

## 2014-07-20 LAB — BLOOD PRODUCT ORDER (VERBAL) VERIFICATION

## 2014-07-20 LAB — CARBOXYHEMOGLOBIN
Carboxyhemoglobin: 0.9 % (ref 0.5–1.5)
Methemoglobin: 1.4 % (ref 0.0–1.5)
O2 SAT: 68.7 %
TOTAL HEMOGLOBIN: 12.4 g/dL — AB (ref 13.5–18.0)

## 2014-07-20 LAB — CBC
HEMATOCRIT: 39.1 % (ref 39.0–52.0)
HEMOGLOBIN: 13.1 g/dL (ref 13.0–17.0)
MCH: 32.3 pg (ref 26.0–34.0)
MCHC: 33.5 g/dL (ref 30.0–36.0)
MCV: 96.5 fL (ref 78.0–100.0)
Platelets: 94 10*3/uL — ABNORMAL LOW (ref 150–400)
RBC: 4.05 MIL/uL — ABNORMAL LOW (ref 4.22–5.81)
RDW: 14.3 % (ref 11.5–15.5)
WBC: 5.1 10*3/uL (ref 4.0–10.5)

## 2014-07-20 MED ORDER — PERFLUTREN LIPID MICROSPHERE
1.0000 mL | INTRAVENOUS | Status: AC | PRN
  Administered 2014-07-20: 1 mL via INTRAVENOUS
  Filled 2014-07-20: qty 10

## 2014-07-20 MED ORDER — PERFLUTREN LIPID MICROSPHERE
INTRAVENOUS | Status: AC
  Administered 2014-07-20: 2 mL
  Filled 2014-07-20: qty 10

## 2014-07-20 NOTE — Progress Notes (Addendum)
  Echocardiogram 2D Echocardiogram with Definity has been performed.  Marcus [name] 07/20/2014, 2:35 PM

## 2014-07-20 NOTE — Progress Notes (Signed)
Patient ID: Marcus [name], male   DOB: 1928-08-06, 81 y.o.   MRN: [id number]    Patient Name: Marcus [name] Date of Encounter: 07/20/2014     Active Problems:   Pacemaker infection    SUBJECTIVE  No chest pain or sob. Minimal incisional pain. Awake, and answers questions.  CURRENT MEDS . amiodarone  200 mg Oral Daily  . apixaban  2.5 mg Oral BID  . vitamin E  400 Units Oral Daily    OBJECTIVE  Filed Vitals:   07/20/14 0500 07/20/14 0530 07/20/14 0600 07/20/14 0700  BP: 109/73  86/56 99/58  Pulse: 88 89 88 88  Temp:      TempSrc:      Resp: 13 14 11 15   Height:      Weight:      SpO2: 98% 96% 95% 96%    Intake/Output Summary (Last 24 hours) at 07/20/14 0743 Last data filed at 07/20/14 0700  Gross per 24 hour  Intake 3038.46 ml  Output   1935 ml  Net 1103.46 ml   Filed Weights   07/19/14 0552  Weight: 174 lb (78.926 kg)    PHYSICAL EXAM  General: Pleasant, elderly man, conversive, NAD. Neuro: Alert and oriented X 3. Moves all extremities spontaneously. Psych: Normal affect. HEENT:  Normal  Neck: Supple without bruits or JVD. Lungs:  Resp regular and unlabored, CTA. Heart: RRR no s3, s4, or murmurs. Abdomen: Soft, non-tender, non-distended, BS + x 4.  Extremities: No clubbing, cyanosis or edema. DP/PT/Radials 2+ and equal bilaterally. Warm  Accessory Clinical Findings  CBC  Recent Labs  07/19/14 1355 07/19/14 1735  WBC 9.6 9.5  HGB 12.3* 12.1*  HCT 37.3* 36.4*  MCV 96.6 95.3  PLT 138* [id number]   Basic Metabolic Panel  Recent Labs  07/19/14 1735 07/20/14 0430  NA 135 142  K 4.6 4.3  CL 104 108  CO2 23 26  GLUCOSE 251* 101*  BUN 77* 57*  CREATININE 2.32* 1.86*  CALCIUM 8.3* 8.2*   Liver Function Tests  Recent Labs  07/19/14 1735  AST 46*  ALT 26  ALKPHOS 27*  BILITOT 0.8  PROT 6.1  ALBUMIN 3.1*   No results for input(s): LIPASE, AMYLASE in the last 72 hours. Cardiac Enzymes No results for input(s): CKTOTAL, CKMB,  CKMBINDEX, TROPONINI in the last 72 hours. BNP Invalid input(s): POCBNP D-Dimer No results for input(s): DDIMER in the last 72 hours. Hemoglobin A1C No results for input(s): HGBA1C in the last 72 hours. Fasting Lipid Panel No results for input(s): CHOL, HDL, LDLCALC, TRIG, CHOLHDL, LDLDIRECT in the last 72 hours. Thyroid Function Tests No results for input(s): TSH, T4TOTAL, T3FREE, THYROIDAB in the last 72 hours.  Invalid input(s): FREET3  TELE  NSR with dysynchronous ventricular pacing  ECG  nsr with dysynchronous ventricular pacing  Radiology/Studies  Dg Chest 2 View  07/20/2014   CLINICAL DATA:  Status post pacemaker placement  EXAM: CHEST  2 VIEW  COMPARISON:  Portable chest x-ray of July 19, 2014 and March 24, 2014.  FINDINGS: The lungs are well-expanded. There is no right-sided pneumothorax or hemothorax. There is chronic scarring of the pleura at the left lung base laterally and there is chronic blunting of the right lateral costophrenic angle. The cardiac silhouette is mildly enlarged. The pulmonary vascularity is normal. The permanent pacemaker electrodes are in reasonable position radiographically. The left internal jugular venous catheter tip projects over the proximal SVC. There are 7 intact sternal wires from previous  median sternotomy. The bony thorax exhibits no acute abnormality.  IMPRESSION: There is no postprocedure complication following permanent pacemaker placement. The support tubes and lines are in appropriate position.   Electronically Signed   By: David  Martinique   On: 07/20/2014 07:35   Dg Chest Port 1 View  07/19/2014   CLINICAL DATA:  Pacemaker lead removal/extraction, central line placement  EXAM: PORTABLE CHEST - 1 VIEW  COMPARISON:  03/24/2014  FINDINGS: Right-sided pacer in place with single lead terminating over the expected location of the right ventricle. Left IJ central venous catheter tip terminates at the brachiocephalic/SVC junction. Evidence of  median sternotomy. Heart size at upper limits of normal. Lungs are hypoaerated with crowding of the bronchovascular markings and curvilinear bibasilar atelectasis. Trace bilateral pleural effusions are present. No acute osseous abnormality. No pneumothorax.  IMPRESSION: Low volumes with probable bibasilar atelectasis. Consider PA and lateral chest radiographs obtained at full inspiration when the patient is clinically able.  Left IJ central line tip terminating at the brachiocephalic/ SVC junction.   Electronically Signed   By: Conchita Paris M.D.   On: 07/19/2014 14:51    ASSESSMENT AND PLAN  1. S/p PM system extraction 2. Hypotension requiring pressors immediately after procedure due to cardiogenic shock 3. Acute renal failure secondary to hypotension, now improving 4. Underlying complete heart block Rec: Will plan to remove foley and art line today. Get up out of bed and into chair. Progress activity as tolerated. Will plan on biv reinsertion based on how well he tolerates RV only pacing. If he remains hypotensive with clinical evidence a low output state, then would shoot for biv ppm reinsertion later in the week. If he is improved clinically, will allow the patient to go home and come back after incision has had a chance to heal. I suspect the former will be more likely than the latter.  Gregg Taylor,M.D.  07/20/2014 7:43 AM

## 2014-07-20 NOTE — Progress Notes (Signed)
Patient ID: Marcus [name], male   DOB: [birth year], 81 y.o.   MRN: [id number]   SUBJECTIVE: Pressors weaned to off this morning.  BP stable, he is doing well.  CVP 5, co-ox 69%.  V-pacing on telemetry.   Scheduled Meds: . amiodarone  200 mg Oral Daily  . apixaban  2.5 mg Oral BID  . vitamin E  400 Units Oral Daily   Continuous Infusions: . sodium chloride 10 mL/hr at 07/19/14 1900  . norepinephrine (LEVOPHED) Adult infusion Stopped (07/20/14 0615)  . phenylephrine (NEO-SYNEPHRINE) Adult infusion Stopped (07/19/14 1746)   PRN Meds:.acetaminophen, ondansetron (ZOFRAN) IV    Filed Vitals:   07/20/14 0500 07/20/14 0530 07/20/14 0600 07/20/14 0700  BP: 109/73  86/56 99/58  Pulse: 88 89 88 88  Temp:      TempSrc:      Resp: 13 14 11 15   Height:      Weight:      SpO2: 98% 96% 95% 96%    Intake/Output Summary (Last 24 hours) at 07/20/14 0718 Last data filed at 07/20/14 0700  Gross per 24 hour  Intake 3038.46 ml  Output   1935 ml  Net 1103.46 ml    LABS: Basic Metabolic Panel:  Recent Labs  07/19/14 1735 07/20/14 0430  NA 135 142  K 4.6 4.3  CL 104 108  CO2 23 26  GLUCOSE 251* 101*  BUN 77* 57*  CREATININE 2.32* 1.86*  CALCIUM 8.3* 8.2*   Liver Function Tests:  Recent Labs  07/19/14 1735  AST 46*  ALT 26  ALKPHOS 27*  BILITOT 0.8  PROT 6.1  ALBUMIN 3.1*   No results for input(s): LIPASE, AMYLASE in the last 72 hours. CBC:  Recent Labs  07/19/14 1355 07/19/14 1735  WBC 9.6 9.5  HGB 12.3* 12.1*  HCT 37.3* 36.4*  MCV 96.6 95.3  PLT 138* 155   Cardiac Enzymes: No results for input(s): CKTOTAL, CKMB, CKMBINDEX, TROPONINI in the last 72 hours. BNP: Invalid input(s): POCBNP D-Dimer: No results for input(s): DDIMER in the last 72 hours. Hemoglobin A1C: No results for input(s): HGBA1C in the last 72 hours. Fasting Lipid Panel: No results for input(s): CHOL, HDL, LDLCALC, TRIG, CHOLHDL, LDLDIRECT in the last 72 hours. Thyroid Function  Tests: No results for input(s): TSH, T4TOTAL, T3FREE, THYROIDAB in the last 72 hours.  Invalid input(s): FREET3 Anemia Panel: No results for input(s): VITAMINB12, FOLATE, FERRITIN, TIBC, IRON, RETICCTPCT in the last 72 hours.  RADIOLOGY: Dg Chest Port 1 View  07/19/2014   CLINICAL DATA:  Pacemaker lead removal/extraction, central line placement  EXAM: PORTABLE CHEST - 1 VIEW  COMPARISON:  03/24/2014  FINDINGS: Right-sided pacer in place with single lead terminating over the expected location of the right ventricle. Left IJ central venous catheter tip terminates at the brachiocephalic/SVC junction. Evidence of median sternotomy. Heart size at upper limits of normal. Lungs are hypoaerated with crowding of the bronchovascular markings and curvilinear bibasilar atelectasis. Trace bilateral pleural effusions are present. No acute osseous abnormality. No pneumothorax.  IMPRESSION: Low volumes with probable bibasilar atelectasis. Consider PA and lateral chest radiographs obtained at full inspiration when the patient is clinically able.  Left IJ central line tip terminating at the brachiocephalic/ SVC junction.   Electronically Signed   By: Conchita Paris M.D.   On: 07/19/2014 14:51    PHYSICAL EXAM General: NAD Neck: No JVD, no thyromegaly or thyroid nodule.  Lungs: Decreased breath sounds at bases bilaterally.  CV: Nondisplaced PMI.  Heart  regular S1/S2, no S3/S4, no murmur.  No peripheral edema.  No carotid bruit.  Normal pedal pulses.  Abdomen: Soft, nontender, no hepatosplenomegaly, no distention.  Neurologic: Alert and oriented x 3.  Psych: Normal affect. Extremities: No clubbing or cyanosis.   TELEMETRY: Reviewed telemetry pt in v-paced, ?underlying atrial fibrillation  ASSESSMENT AND PLAN: 81 yo with history of pacemaker for bradycardia, paroxysmal atrial fibrillation, CAD s/p CABG and PCIs, and ischemic cardiomyopathy EF 20-25%. Underwent CRT-D upgrade 10/08/13 due to 85% RV pacing.EF  improved to 35-40%.  Now s/p explantation of CRT-D device and placement of temp-perm device due to suspected pocket infection.  1. CRT-D device pocket infection: s/p explantation. Has temporary-permanent device now. He is on Ancef, blood cultures sent.  2. Chronic systolic CHF: Ischemic cardiomyopathy, EF 35-40% on last echo.  He was hypotensive post-explantation of CRT-D.  He lost capture after temp-perm placement with brief CPR and initially required pressors.  No pericardial effusion on limited bedside echo yesterday. Now off pressors. He has lost BiV pacing and has done poorly with dyssynchronous RV pacing in the past. CVP 5 today with good co-ox.  - Doing better this morning, will let him get out of bed. Will need resumption of CRT as soon as feasible.  - Will get echo this morning.  3. Atrial fibrillation: Paroxysmal. He may be in underlying atrial fibrillation currently.  Continue amiodarone 200 mg daily. Stable this morning, will restart Eliquis 2.5 mg bid today.  Marcus [name] 07/20/2014 7:25 AM

## 2014-07-21 LAB — BASIC METABOLIC PANEL
Anion gap: 6 (ref 5–15)
BUN: 45 mg/dL — ABNORMAL HIGH (ref 6–23)
CO2: 25 mmol/L (ref 19–32)
Calcium: 8.7 mg/dL (ref 8.4–10.5)
Chloride: 108 mmol/L (ref 96–112)
Creatinine, Ser: 1.81 mg/dL — ABNORMAL HIGH (ref 0.50–1.35)
GFR calc Af Amer: 38 mL/min — ABNORMAL LOW (ref >90)
GFR calc non Af Amer: 33 mL/min — ABNORMAL LOW (ref >90)
Glucose, Bld: 96 mg/dL (ref 70–99)
POTASSIUM: 4.5 mmol/L (ref 3.5–5.1)
SODIUM: 139 mmol/L (ref 135–145)

## 2014-07-21 LAB — CBC
HCT: 36.1 % — ABNORMAL LOW (ref 39.0–52.0)
Hemoglobin: 11.7 g/dL — ABNORMAL LOW (ref 13.0–17.0)
MCH: 31.9 pg (ref 26.0–34.0)
MCHC: 32.4 g/dL (ref 30.0–36.0)
MCV: 98.4 fL (ref 78.0–100.0)
Platelets: 102 10*3/uL — ABNORMAL LOW (ref 150–400)
RBC: 3.67 MIL/uL — ABNORMAL LOW (ref 4.22–5.81)
RDW: 14.6 % (ref 11.5–15.5)
WBC: 5.7 10*3/uL (ref 4.0–10.5)

## 2014-07-21 LAB — TYPE AND SCREEN
ABO/RH(D): O POS
Antibody Screen: NEGATIVE
UNIT DIVISION: 0
Unit division: 0

## 2014-07-21 LAB — CARBOXYHEMOGLOBIN
CARBOXYHEMOGLOBIN: 1.1 % (ref 0.5–1.5)
Methemoglobin: 1.5 % (ref 0.0–1.5)
O2 SAT: 82.3 %
Total hemoglobin: 12.1 g/dL — ABNORMAL LOW (ref 13.5–18.0)

## 2014-07-21 MED ORDER — TORSEMIDE 20 MG PO TABS
20.0000 mg | ORAL_TABLET | Freq: Every day | ORAL | Status: DC
  Administered 2014-07-21 – 2014-07-25 (×4): 20 mg via ORAL
  Filled 2014-07-21 (×5): qty 1

## 2014-07-21 MED ORDER — SPIRONOLACTONE 12.5 MG HALF TABLET
12.5000 mg | ORAL_TABLET | Freq: Every day | ORAL | Status: DC
Start: 2014-07-21 — End: 2014-07-27
  Administered 2014-07-21 – 2014-07-27 (×6): 12.5 mg via ORAL
  Filled 2014-07-21 (×7): qty 1

## 2014-07-21 MED ORDER — DOCUSATE SODIUM 100 MG PO CAPS
100.0000 mg | ORAL_CAPSULE | Freq: Every day | ORAL | Status: DC
  Administered 2014-07-21 – 2014-07-27 (×6): 100 mg via ORAL
  Filled 2014-07-21 (×7): qty 1

## 2014-07-21 MED ORDER — ISOSORBIDE MONONITRATE 15 MG HALF TABLET
15.0000 mg | ORAL_TABLET | Freq: Every day | ORAL | Status: DC
Start: 2014-07-21 — End: 2014-07-27
  Administered 2014-07-21 – 2014-07-27 (×6): 15 mg via ORAL
  Filled 2014-07-21 (×7): qty 1

## 2014-07-21 NOTE — Progress Notes (Signed)
Patient Name: Marcus [name] Date of Encounter: 07/21/2014     Active Problems:   Pacemaker infection    SUBJECTIVE  Denies chest pain or sob. Got up and ambulated around the room yesterday.   CURRENT MEDS . amiodarone  200 mg Oral Daily  . apixaban  2.5 mg Oral BID  . isosorbide mononitrate  15 mg Oral Daily  . spironolactone  12.5 mg Oral Daily  . torsemide  20 mg Oral Daily  . vitamin E  400 Units Oral Daily    OBJECTIVE  Filed Vitals:   07/21/14 0400 07/21/14 0500 07/21/14 0600 07/21/14 0700  BP: 102/73 103/64 107/68 97/65  Pulse: 89 89 89 89  Temp:      TempSrc:      Resp: 10 14 19 13   Height:      Weight:      SpO2: 96% 97% 97% 97%    Intake/Output Summary (Last 24 hours) at 07/21/14 0740 Last data filed at 07/21/14 0600  Gross per 24 hour  Intake    830 ml  Output   1045 ml  Net   -215 ml   Filed Weights   07/19/14 0552  Weight: 174 lb (78.926 kg)    PHYSICAL EXAM  General: Pleasant, NAD. Neuro: Alert and oriented X 3. Moves all extremities spontaneously. Psych: Normal affect. HEENT:  Normal  Neck: Supple without bruits or JVD. Lungs:  Resp regular and unlabored, CTA. Right chest incision closed with minimal sanguinous drainage and no hematoma. Heart: RRR no s3, s4, or murmurs. Abdomen: Soft, non-tender, non-distended, BS + x 4.  Extremities: No clubbing, cyanosis or edema. DP/PT/Radials 2+ and equal bilaterally.  Accessory Clinical Findings  CBC  Recent Labs  07/20/14 0730 07/21/14 0400  WBC 5.1 5.7  HGB 13.1 11.7*  HCT 39.1 36.1*  MCV 96.5 98.4  PLT 94* [id number]*   Basic Metabolic Panel  Recent Labs  07/20/14 0430 07/21/14 0400  NA 142 139  K 4.3 4.5  CL 108 108  CO2 26 25  GLUCOSE 101* 96  BUN 57* 45*  CREATININE 1.86* 1.81*  CALCIUM 8.2* 8.7   Liver Function Tests  Recent Labs  07/19/14 1735  AST 46*  ALT 26  ALKPHOS 27*  BILITOT 0.8  PROT 6.1  ALBUMIN 3.1*   No results for input(s): LIPASE, AMYLASE in  the last 72 hours. Cardiac Enzymes No results for input(s): CKTOTAL, CKMB, CKMBINDEX, TROPONINI in the last 72 hours. BNP Invalid input(s): POCBNP D-Dimer No results for input(s): DDIMER in the last 72 hours. Hemoglobin A1C No results for input(s): HGBA1C in the last 72 hours. Fasting Lipid Panel No results for input(s): CHOL, HDL, LDLCALC, TRIG, CHOLHDL, LDLDIRECT in the last 72 hours. Thyroid Function Tests No results for input(s): TSH, T4TOTAL, T3FREE, THYROIDAB in the last 72 hours.  Invalid input(s): FREET3  TELE  nsr with dysynchronous pacing.  Radiology/Studies  Dg Chest 2 View  07/20/2014   CLINICAL DATA:  Status post pacemaker placement  EXAM: CHEST  2 VIEW  COMPARISON:  Portable chest x-ray of July 19, 2014 and March 24, 2014.  FINDINGS: The lungs are well-expanded. There is no right-sided pneumothorax or hemothorax. There is chronic scarring of the pleura at the left lung base laterally and there is chronic blunting of the right lateral costophrenic angle. The cardiac silhouette is mildly enlarged. The pulmonary vascularity is normal. The permanent pacemaker electrodes are in reasonable position radiographically. The left internal jugular venous catheter tip  projects over the proximal SVC. There are 7 intact sternal wires from previous median sternotomy. The bony thorax exhibits no acute abnormality.  IMPRESSION: There is no postprocedure complication following permanent pacemaker placement. The support tubes and lines are in appropriate position.   Electronically Signed   By: David  Martinique   On: 07/20/2014 07:35   Dg Chest Port 1 View  07/19/2014   CLINICAL DATA:  Pacemaker lead removal/extraction, central line placement  EXAM: PORTABLE CHEST - 1 VIEW  COMPARISON:  03/24/2014  FINDINGS: Right-sided pacer in place with single lead terminating over the expected location of the right ventricle. Left IJ central venous catheter tip terminates at the brachiocephalic/SVC junction.  Evidence of median sternotomy. Heart size at upper limits of normal. Lungs are hypoaerated with crowding of the bronchovascular markings and curvilinear bibasilar atelectasis. Trace bilateral pleural effusions are present. No acute osseous abnormality. No pneumothorax.  IMPRESSION: Low volumes with probable bibasilar atelectasis. Consider PA and lateral chest radiographs obtained at full inspiration when the patient is clinically able.  Left IJ central line tip terminating at the brachiocephalic/ SVC junction.   Electronically Signed   By: Conchita Paris M.D.   On: 07/19/2014 14:51    ASSESSMENT AND PLAN  1. PM pocket erosion 2. S/p extraction complicated by post op hypotension 3. S/p temp-perm tv PM 4. Acute on chronic systolic heart failure due to low output requiring pressors, now resolved. Rec: in light of all things, I think planning to proceed with reimplant on Friday makes the most sense. I will not be available but will look at Dr. Theodoro Clock schedule.  Mikle Bosworth.D.

## 2014-07-21 NOTE — Progress Notes (Signed)
Patient ID: Marcus [name], male   DOB: 28-Apr-1929, 81 y.o.   MRN: [id number]   SUBJECTIVE: Doing well so far. CVP 5 with good co-ox this morning.  V-pacing.  Echo showed EF 35-40%, no pericardial effusion, mild to moderate MR (no significant change from prior).    Scheduled Meds: . amiodarone  200 mg Oral Daily  . apixaban  2.5 mg Oral BID  . isosorbide mononitrate  15 mg Oral Daily  . spironolactone  12.5 mg Oral Daily  . torsemide  20 mg Oral Daily  . vitamin E  400 Units Oral Daily   Continuous Infusions: . sodium chloride 10 mL/hr at 07/20/14 1600  . norepinephrine (LEVOPHED) Adult infusion Stopped (07/20/14 0615)  . phenylephrine (NEO-SYNEPHRINE) Adult infusion Stopped (07/19/14 1746)   PRN Meds:.acetaminophen, ondansetron (ZOFRAN) IV    Filed Vitals:   07/21/14 0400 07/21/14 0500 07/21/14 0600 07/21/14 0700  BP: 102/73 103/64 107/68 97/65  Pulse: 89 89 89 89  Temp:      TempSrc:      Resp: 10 14 19 13   Height:      Weight:      SpO2: 96% 97% 97% 97%    Intake/Output Summary (Last 24 hours) at 07/21/14 0715 Last data filed at 07/21/14 0600  Gross per 24 hour  Intake    830 ml  Output   1045 ml  Net   -215 ml    LABS: Basic Metabolic Panel:  Recent Labs  07/20/14 0430 07/21/14 0400  NA 142 139  K 4.3 4.5  CL 108 108  CO2 26 25  GLUCOSE 101* 96  BUN 57* 45*  CREATININE 1.86* 1.81*  CALCIUM 8.2* 8.7   Liver Function Tests:  Recent Labs  07/19/14 1735  AST 46*  ALT 26  ALKPHOS 27*  BILITOT 0.8  PROT 6.1  ALBUMIN 3.1*   No results for input(s): LIPASE, AMYLASE in the last 72 hours. CBC:  Recent Labs  07/20/14 0730 07/21/14 0400  WBC 5.1 5.7  HGB 13.1 11.7*  HCT 39.1 36.1*  MCV 96.5 98.4  PLT 94* 102*   Cardiac Enzymes: No results for input(s): CKTOTAL, CKMB, CKMBINDEX, TROPONINI in the last 72 hours. BNP: Invalid input(s): POCBNP D-Dimer: No results for input(s): DDIMER in the last 72 hours. Hemoglobin A1C: No results for  input(s): HGBA1C in the last 72 hours. Fasting Lipid Panel: No results for input(s): CHOL, HDL, LDLCALC, TRIG, CHOLHDL, LDLDIRECT in the last 72 hours. Thyroid Function Tests: No results for input(s): TSH, T4TOTAL, T3FREE, THYROIDAB in the last 72 hours.  Invalid input(s): FREET3 Anemia Panel: No results for input(s): VITAMINB12, FOLATE, FERRITIN, TIBC, IRON, RETICCTPCT in the last 72 hours.  RADIOLOGY: Dg Chest 2 View  07/20/2014   CLINICAL DATA:  Status post pacemaker placement  EXAM: CHEST  2 VIEW  COMPARISON:  Portable chest x-ray of July 19, 2014 and March 24, 2014.  FINDINGS: The lungs are well-expanded. There is no right-sided pneumothorax or hemothorax. There is chronic scarring of the pleura at the left lung base laterally and there is chronic blunting of the right lateral costophrenic angle. The cardiac silhouette is mildly enlarged. The pulmonary vascularity is normal. The permanent pacemaker electrodes are in reasonable position radiographically. The left internal jugular venous catheter tip projects over the proximal SVC. There are 7 intact sternal wires from previous median sternotomy. The bony thorax exhibits no acute abnormality.  IMPRESSION: There is no postprocedure complication following permanent pacemaker placement. The support tubes and  lines are in appropriate position.   Electronically Signed   By: David  Martinique   On: 07/20/2014 07:35   Dg Chest Port 1 View  07/19/2014   CLINICAL DATA:  Pacemaker lead removal/extraction, central line placement  EXAM: PORTABLE CHEST - 1 VIEW  COMPARISON:  03/24/2014  FINDINGS: Right-sided pacer in place with single lead terminating over the expected location of the right ventricle. Left IJ central venous catheter tip terminates at the brachiocephalic/SVC junction. Evidence of median sternotomy. Heart size at upper limits of normal. Lungs are hypoaerated with crowding of the bronchovascular markings and curvilinear bibasilar atelectasis.  Trace bilateral pleural effusions are present. No acute osseous abnormality. No pneumothorax.  IMPRESSION: Low volumes with probable bibasilar atelectasis. Consider PA and lateral chest radiographs obtained at full inspiration when the patient is clinically able.  Left IJ central line tip terminating at the brachiocephalic/ SVC junction.   Electronically Signed   By: Conchita Paris M.D.   On: 07/19/2014 14:51    PHYSICAL EXAM General: NAD Neck: JVP 8 cm, no thyromegaly or thyroid nodule.  Lungs: Decreased breath sounds at bases bilaterally.  CV: Nondisplaced PMI.  Heart regular S1/S2, no S3/S4, no murmur.  Trace ankle edema.   Abdomen: Soft, nontender, no hepatosplenomegaly, no distention.  Neurologic: Alert and oriented x 3.  Psych: Normal affect. Extremities: No clubbing or cyanosis.   TELEMETRY: Reviewed telemetry pt in v-paced, suspect underlying NSR.   ASSESSMENT AND PLAN: 81 yo with history of pacemaker for bradycardia, paroxysmal atrial fibrillation, CAD s/p CABG and PCIs, and ischemic cardiomyopathy EF 20-25%. Underwent CRT-D upgrade 10/08/13 due to 85% RV pacing.EF improved to 35-40%.  Now s/p explantation of CRT-D device and placement of temp-perm device due to suspected pocket infection.  1. CRT-D device pocket infection: s/p explantation. Has temporary-permanent device now. Afebrile, cultures sent, WBCs normal. 2. Chronic systolic CHF: Ischemic cardiomyopathy, EF 35-40% on echo this admission.  This is stable compared to prior.  He was hypotensive post-explantation of CRT-D.  He lost capture after temp-perm placement with brief CPR and initially required pressors.  No pericardial effusion. Now off pressors. He has lost BiV pacing and has done poorly with dyssynchronous RV pacing in the past. CVP 5 today with good co-ox. Tolerating RV pacing so far.  - Out of bed, ambulate.  Cardiac rehab/PT.  - Restart home torsemide and spironolactone today.  - Will see how he does: if poorly,  could replace CRT as early as Friday.  If tolerates, will wait longer.   3. Atrial fibrillation: Paroxysmal. Suspect NSR today with V-pacing.  Continue amiodarone 200 mg daily. He is on Eliquis.  4. May transfer to step down.   [name] 07/21/2014 7:15 AM

## 2014-07-21 NOTE — Progress Notes (Signed)
Orthopedic Tech Progress Note Patient Details:  Marcus [name] [birth year] [id number] Delivered arm sling to pt.'s nurse. Patient ID: Marcus [name], male   DOB: [birth year], 81 y.o.   MRN: [id number]   Darrol Poke 07/21/2014, 3:17 PM

## 2014-07-21 NOTE — Progress Notes (Signed)
CARDIAC REHAB PHASE I   PRE:  Rate/Rhythm: 90 paced  BP:  Supine:   Sitting:111/71  Standing:    SaO2:   MODE:  Ambulation: 390 ft   POST:  Rate/Rhythm: 100 paced  BP:  Supine:   Sitting: 130/86  Standing:    SaO2:  1445-1515 Pt really wanted to walk. Promised to keep right arm still during walk. Walked 390 ft with asst x 1 with steady gait. Encouraged pt to slow down as he started out fast. Tolerated well. Back to recliner after walk. Pacing.   Graylon Good, RN BSN  07/21/2014 3:11 PM

## 2014-07-22 DIAGNOSIS — I5022 Chronic systolic (congestive) heart failure: Secondary | ICD-10-CM | POA: Insufficient documentation

## 2014-07-22 DIAGNOSIS — T827XXD Infection and inflammatory reaction due to other cardiac and vascular devices, implants and grafts, subsequent encounter: Secondary | ICD-10-CM

## 2014-07-22 LAB — CARBOXYHEMOGLOBIN
CARBOXYHEMOGLOBIN: 0.9 % (ref 0.5–1.5)
METHEMOGLOBIN: 1.4 % (ref 0.0–1.5)
O2 Saturation: 68.4 %
Total hemoglobin: 11.8 g/dL — ABNORMAL LOW (ref 13.5–18.0)

## 2014-07-22 LAB — CBC
HCT: 35 % — ABNORMAL LOW (ref 39.0–52.0)
Hemoglobin: 11.5 g/dL — ABNORMAL LOW (ref 13.0–17.0)
MCH: 31.6 pg (ref 26.0–34.0)
MCHC: 32.9 g/dL (ref 30.0–36.0)
MCV: 96.2 fL (ref 78.0–100.0)
PLATELETS: 105 10*3/uL — AB (ref 150–400)
RBC: 3.64 MIL/uL — ABNORMAL LOW (ref 4.22–5.81)
RDW: 14.2 % (ref 11.5–15.5)
WBC: 6.1 10*3/uL (ref 4.0–10.5)

## 2014-07-22 LAB — BASIC METABOLIC PANEL
ANION GAP: 6 (ref 5–15)
BUN: 37 mg/dL — ABNORMAL HIGH (ref 6–23)
CALCIUM: 8.4 mg/dL (ref 8.4–10.5)
CO2: 27 mmol/L (ref 19–32)
Chloride: 104 mmol/L (ref 96–112)
Creatinine, Ser: 1.88 mg/dL — ABNORMAL HIGH (ref 0.50–1.35)
GFR calc Af Amer: 36 mL/min — ABNORMAL LOW (ref >90)
GFR calc non Af Amer: 31 mL/min — ABNORMAL LOW (ref >90)
Glucose, Bld: 95 mg/dL (ref 70–99)
Potassium: 4.2 mmol/L (ref 3.5–5.1)
SODIUM: 137 mmol/L (ref 135–145)

## 2014-07-22 MED ORDER — CEFAZOLIN SODIUM-DEXTROSE 2-3 GM-% IV SOLR
2.0000 g | INTRAVENOUS | Status: DC
  Filled 2014-07-22: qty 50

## 2014-07-22 MED ORDER — SODIUM CHLORIDE 0.9 % IR SOLN
80.0000 mg | Status: DC
  Filled 2014-07-22: qty 2

## 2014-07-22 MED ORDER — GENTAMICIN SULFATE 40 MG/ML IJ SOLN: 80.0000 mg | INTRAMUSCULAR | Status: DC

## 2014-07-22 MED ORDER — CHLORHEXIDINE GLUCONATE 4 % EX LIQD
60.0000 mL | Freq: Once | CUTANEOUS | Status: AC
  Administered 2014-07-23: 4 via TOPICAL
  Filled 2014-07-22: qty 60

## 2014-07-22 MED ORDER — CEFAZOLIN SODIUM-DEXTROSE 2-3 GM-% IV SOLR: 2.0000 g | INTRAVENOUS | Status: DC

## 2014-07-22 MED ORDER — SODIUM CHLORIDE 0.9 % IV SOLN
INTRAVENOUS | Status: DC
Start: 2014-07-23 — End: 2014-07-23
  Administered 2014-07-23: 05:00:00 via INTRAVENOUS

## 2014-07-22 MED ORDER — SODIUM CHLORIDE 0.9 % IV SOLN: INTRAVENOUS | Status: DC

## 2014-07-22 MED ORDER — SODIUM CHLORIDE 0.9 % IV SOLN
INTRAVENOUS | Status: DC
  Administered 2014-07-23: 05:00:00 via INTRAVENOUS

## 2014-07-22 MED ORDER — CHLORHEXIDINE GLUCONATE 4 % EX LIQD
60.0000 mL | Freq: Once | CUTANEOUS | Status: AC
  Administered 2014-07-22: 4 via TOPICAL
  Filled 2014-07-22: qty 60

## 2014-07-22 NOTE — Evaluation (Signed)
Physical Therapy Evaluation Patient Details Name: Marcus [name] MRN: [id number] DOB: 1928-09-07 Today's Date: 07/22/2014   History of Present Illness  Pt adm with infected pacer. Pacer removed with insertion of temporary pacer. Plans to place new pacer.  Clinical Impression  Pt doing well with mobility and no further PT needed.  Ready for dc from PT standpoint.      Follow Up Recommendations No PT follow up    Equipment Recommendations  None recommended by PT    Recommendations for Other Services       Precautions / Restrictions Precautions Precautions: ICD/Pacemaker      Mobility  Bed Mobility Overal bed mobility: Modified Independent                Transfers Overall transfer level: Modified independent                  Ambulation/Gait Ambulation/Gait assistance: Modified independent (Device/Increase time) Ambulation Distance (Feet): 400 Feet Assistive device: None (or pushing IV pole) Gait Pattern/deviations: WFL(Within Functional Limits)   Gait velocity interpretation: at or above normal speed for age/gender    Stairs            Wheelchair Mobility    Modified Rankin (Stroke Patients Only)       Balance Overall balance assessment: No apparent balance deficits (not formally assessed)                                           Pertinent Vitals/Pain Pain Assessment: No/denies pain    Home Living Family/patient expects to be discharged to:: Private residence Living Arrangements: Spouse/significant other Available Help at Discharge: Family;Available PRN/intermittently Type of Home: House Home Access: Stairs to enter Entrance Stairs-Rails: None Entrance Stairs-Number of Steps: 3 Home Layout: One level Home Equipment: Shower seat - built in      Prior Function Level of Independence: Independent               Hand Dominance   Dominant Hand: Right    Extremity/Trunk Assessment   Upper Extremity  Assessment: Overall WFL for tasks assessed           Lower Extremity Assessment: Overall WFL for tasks assessed         Communication   Communication: No difficulties  Cognition Arousal/Alertness: Awake/alert Behavior During Therapy: WFL for tasks assessed/performed Overall Cognitive Status: Within Functional Limits for tasks assessed                      General Comments      Exercises        Assessment/Plan    PT Assessment Patent does not need any further PT services  PT Diagnosis Difficulty walking   PT Problem List    PT Treatment Interventions     PT Goals (Current goals can be found in the Care Plan section) Acute Rehab PT Goals PT Goal Formulation: All assessment and education complete, DC therapy    Frequency     Barriers to discharge        Co-evaluation               End of Session   Activity Tolerance: Patient tolerated treatment well Patient left: in chair;with call bell/phone within reach Nurse Communication: Mobility status         Time: [id number] PT Time Calculation (min) (ACUTE ONLY): 14  min   Charges:   PT Evaluation $Initial PT Evaluation Tier I: 1 Procedure     PT G Codes:        Zaeem Kandel 07-28-2014, 10:03 AM  Suanne Marker PT [phone]

## 2014-07-22 NOTE — Progress Notes (Signed)
New peripheral IV obtained sucessfully, central line and previous IV removed.   Roxan Hockey, RN

## 2014-07-22 NOTE — Progress Notes (Signed)
Patient ID: SEGUNDO NEVEL, male   DOB: 10-May-1929, 81 y.o.   MRN: [id number]    Patient Name: Marcus [name] Date of Encounter: 07/22/2014     Active Problems:   Pacemaker infection    SUBJECTIVE  No chest pain. Able to get out of bed yesterday.   CURRENT MEDS . amiodarone  200 mg Oral Daily  . apixaban  2.5 mg Oral BID  . docusate sodium  100 mg Oral Daily  . isosorbide mononitrate  15 mg Oral Daily  . spironolactone  12.5 mg Oral Daily  . torsemide  20 mg Oral Daily  . vitamin E  400 Units Oral Daily    OBJECTIVE  Filed Vitals:   07/22/14 0015 07/22/14 0200 07/22/14 0400 07/22/14 0600  BP:  127/74 117/45 92/60  Pulse: 88 89 89 90  Temp: 98 F (36.7 C)     TempSrc: Oral     Resp: 13 15 21 13   Height:      Weight:      SpO2: 97% 98% 100% 97%    Intake/Output Summary (Last 24 hours) at 07/22/14 0739 Last data filed at 07/22/14 0000  Gross per 24 hour  Intake    250 ml  Output   1325 ml  Net  -1075 ml   Filed Weights   07/19/14 0552  Weight: 174 lb (78.926 kg)    PHYSICAL EXAM  General: Pleasant, NAD. Neuro: Alert and oriented X 3. Moves all extremities spontaneously. Psych: Normal affect. HEENT:  Normal  Neck: Supple without bruits or JVD. Lungs:  Resp regular and unlabored, CTA. Heart: RRR no s3, s4, or murmurs. Abdomen: Soft, non-tender, non-distended, BS + x 4.  Extremities: No clubbing, cyanosis or edema. DP/PT/Radials 2+ and equal bilaterally.  Accessory Clinical Findings  CBC  Recent Labs  07/21/14 0400 07/22/14 0605  WBC 5.7 6.1  HGB 11.7* 11.5*  HCT 36.1* 35.0*  MCV 98.4 96.2  PLT 102* [id number]*   Basic Metabolic Panel  Recent Labs  07/21/14 0400 07/22/14 0605  NA 139 137  K 4.5 4.2  CL 108 104  CO2 25 27  GLUCOSE 96 95  BUN 45* 37*  CREATININE 1.81* 1.88*  CALCIUM 8.7 8.4   Liver Function Tests  Recent Labs  07/19/14 1735  AST 46*  ALT 26  ALKPHOS 27*  BILITOT 0.8  PROT 6.1  ALBUMIN 3.1*   No results for  input(s): LIPASE, AMYLASE in the last 72 hours. Cardiac Enzymes No results for input(s): CKTOTAL, CKMB, CKMBINDEX, TROPONINI in the last 72 hours. BNP Invalid input(s): POCBNP D-Dimer No results for input(s): DDIMER in the last 72 hours. Hemoglobin A1C No results for input(s): HGBA1C in the last 72 hours. Fasting Lipid Panel No results for input(s): CHOL, HDL, LDLCALC, TRIG, CHOLHDL, LDLDIRECT in the last 72 hours. Thyroid Function Tests No results for input(s): TSH, T4TOTAL, T3FREE, THYROIDAB in the last 72 hours.  Invalid input(s): FREET3  TEL - nsr with dysynchronous ventricular pacing.  Radiology/Studies  Dg Chest 2 View  07/20/2014   CLINICAL DATA:  Status post pacemaker placement  EXAM: CHEST  2 VIEW  COMPARISON:  Portable chest x-ray of July 19, 2014 and March 24, 2014.  FINDINGS: The lungs are well-expanded. There is no right-sided pneumothorax or hemothorax. There is chronic scarring of the pleura at the left lung base laterally and there is chronic blunting of the right lateral costophrenic angle. The cardiac silhouette is mildly enlarged. The pulmonary vascularity is normal. The  permanent pacemaker electrodes are in reasonable position radiographically. The left internal jugular venous catheter tip projects over the proximal SVC. There are 7 intact sternal wires from previous median sternotomy. The bony thorax exhibits no acute abnormality.  IMPRESSION: There is no postprocedure complication following permanent pacemaker placement. The support tubes and lines are in appropriate position.   Electronically Signed   By: David  Martinique   On: 07/20/2014 07:35   Dg Chest Port 1 View  07/19/2014   CLINICAL DATA:  Pacemaker lead removal/extraction, central line placement  EXAM: PORTABLE CHEST - 1 VIEW  COMPARISON:  03/24/2014  FINDINGS: Right-sided pacer in place with single lead terminating over the expected location of the right ventricle. Left IJ central venous catheter tip  terminates at the brachiocephalic/SVC junction. Evidence of median sternotomy. Heart size at upper limits of normal. Lungs are hypoaerated with crowding of the bronchovascular markings and curvilinear bibasilar atelectasis. Trace bilateral pleural effusions are present. No acute osseous abnormality. No pneumothorax.  IMPRESSION: Low volumes with probable bibasilar atelectasis. Consider PA and lateral chest radiographs obtained at full inspiration when the patient is clinically able.  Left IJ central line tip terminating at the brachiocephalic/ SVC junction.   Electronically Signed   By: Conchita Paris M.D.   On: 07/19/2014 14:51    ASSESSMENT AND PLAN  1. biv pm erosion, s/p extraction complicated by profound hypotension 2. Acute on chronic systolic heart failure due to #1 in setting of dysynchronous ventricular pacing 3. Underlying CHB with no escape.  Rec: will plan to proceed with BiV PPM insertion. Dr. Caryl Comes will perform procedure in my absence. Might consider quadrapolar [employer] system.   Gregg Taylor,M.D.  07/22/2014 7:39 AM

## 2014-07-22 NOTE — Progress Notes (Signed)
Permanent pacemaker procedural consent signed by patient. Placed in patient's chart.  Roxan Hockey, RN

## 2014-07-22 NOTE — Progress Notes (Signed)
Patient ID: Marcus [name], male   DOB: [birth year], 81 y.o.   MRN: [id number]   SUBJECTIVE:  Yesterday diuretics restarted. CVP 6, with co-ox remaining stable.   V-pacing. Afebrile. Denies SOB.   Echo showed EF 35-40%, no pericardial effusion, mild to moderate MR (no significant change from prior).    Blood cultures- NGTD  Scheduled Meds: . amiodarone  200 mg Oral Daily  . apixaban  2.5 mg Oral BID  . docusate sodium  100 mg Oral Daily  . isosorbide mononitrate  15 mg Oral Daily  . spironolactone  12.5 mg Oral Daily  . torsemide  20 mg Oral Daily  . vitamin E  400 Units Oral Daily   Continuous Infusions: . sodium chloride Stopped (07/21/14 1900)   PRN Meds:.acetaminophen, ondansetron (ZOFRAN) IV    Filed Vitals:   07/22/14 0015 07/22/14 0200 07/22/14 0400 07/22/14 0600  BP:  127/74 117/45 92/60  Pulse: 88 89 89 90  Temp: 98 F (36.7 C)     TempSrc: Oral     Resp: 13 15 21 13   Height:      Weight:      SpO2: 97% 98% 100% 97%    Intake/Output Summary (Last 24 hours) at 07/22/14 0711 Last data filed at 07/22/14 0000  Gross per 24 hour  Intake    250 ml  Output   1325 ml  Net  -1075 ml    LABS: Basic Metabolic Panel:  Recent Labs  07/21/14 0400 07/22/14 0605  NA 139 137  K 4.5 4.2  CL 108 104  CO2 25 27  GLUCOSE 96 95  BUN 45* 37*  CREATININE 1.81* 1.88*  CALCIUM 8.7 8.4   Liver Function Tests:  Recent Labs  07/19/14 1735  AST 46*  ALT 26  ALKPHOS 27*  BILITOT 0.8  PROT 6.1  ALBUMIN 3.1*   No results for input(s): LIPASE, AMYLASE in the last 72 hours. CBC:  Recent Labs  07/21/14 0400 07/22/14 0605  WBC 5.7 6.1  HGB 11.7* 11.5*  HCT 36.1* 35.0*  MCV 98.4 96.2  PLT 102* 105*   Cardiac Enzymes: No results for input(s): CKTOTAL, CKMB, CKMBINDEX, TROPONINI in the last 72 hours. BNP: Invalid input(s): POCBNP D-Dimer: No results for input(s): DDIMER in the last 72 hours. Hemoglobin A1C: No results for input(s): HGBA1C in the last 72  hours. Fasting Lipid Panel: No results for input(s): CHOL, HDL, LDLCALC, TRIG, CHOLHDL, LDLDIRECT in the last 72 hours. Thyroid Function Tests: No results for input(s): TSH, T4TOTAL, T3FREE, THYROIDAB in the last 72 hours.  Invalid input(s): FREET3 Anemia Panel: No results for input(s): VITAMINB12, FOLATE, FERRITIN, TIBC, IRON, RETICCTPCT in the last 72 hours.  RADIOLOGY: Dg Chest 2 View  07/20/2014   CLINICAL DATA:  Status post pacemaker placement  EXAM: CHEST  2 VIEW  COMPARISON:  Portable chest x-ray of July 19, 2014 and March 24, 2014.  FINDINGS: The lungs are well-expanded. There is no right-sided pneumothorax or hemothorax. There is chronic scarring of the pleura at the left lung base laterally and there is chronic blunting of the right lateral costophrenic angle. The cardiac silhouette is mildly enlarged. The pulmonary vascularity is normal. The permanent pacemaker electrodes are in reasonable position radiographically. The left internal jugular venous catheter tip projects over the proximal SVC. There are 7 intact sternal wires from previous median sternotomy. The bony thorax exhibits no acute abnormality.  IMPRESSION: There is no postprocedure complication following permanent pacemaker placement. The support tubes and lines  are in appropriate position.   Electronically Signed   By: David  Martinique   On: 07/20/2014 07:35   Dg Chest Port 1 View  07/19/2014   CLINICAL DATA:  Pacemaker lead removal/extraction, central line placement  EXAM: PORTABLE CHEST - 1 VIEW  COMPARISON:  03/24/2014  FINDINGS: Right-sided pacer in place with single lead terminating over the expected location of the right ventricle. Left IJ central venous catheter tip terminates at the brachiocephalic/SVC junction. Evidence of median sternotomy. Heart size at upper limits of normal. Lungs are hypoaerated with crowding of the bronchovascular markings and curvilinear bibasilar atelectasis. Trace bilateral pleural effusions  are present. No acute osseous abnormality. No pneumothorax.  IMPRESSION: Low volumes with probable bibasilar atelectasis. Consider PA and lateral chest radiographs obtained at full inspiration when the patient is clinically able.  Left IJ central line tip terminating at the brachiocephalic/ SVC junction.   Electronically Signed   By: Conchita Paris M.D.   On: 07/19/2014 14:51    PHYSICAL EXAM CVP 6  General: NAD Neck: JVP 8 cm, no thyromegaly or thyroid nodule.  Lungs: Decreased breath sounds at bases bilaterally.  CV: Nondisplaced PMI.  Heart regular S1/S2, no S3/S4, no murmur.  No edema.   Abdomen: Soft, nontender, no hepatosplenomegaly, no distention.  Neurologic: Alert and oriented x 3.  Psych: Normal affect. Extremities: No clubbing or cyanosis. Bilateral SCDs.   TELEMETRY: Reviewed telemetry pt in v-paced, suspect underlying NSR.   ASSESSMENT AND PLAN: 81 yo with history of pacemaker for bradycardia, paroxysmal atrial fibrillation, CAD s/p CABG and PCIs, and ischemic cardiomyopathy EF 20-25%. Underwent CRT-D upgrade 10/08/13 due to 85% RV pacing.EF improved to 35-40%.  Now s/p explantation of CRT-D device and placement of temp-perm device due to suspected pocket infection.  1. CRT-D device pocket infection: s/p explantation. Has temporary-permanent device now. Afebrile, cultures NGTD, WBCs normal. 2. Chronic systolic CHF: Ischemic cardiomyopathy, EF 35-40% on echo this admission.  This is stable compared to prior.  He was hypotensive post-explantation of CRT-D.  He lost capture after temp-perm placement with brief CPR and initially required pressors.  No pericardial effusion. Now off pressors. He has lost BiV pacing and has done poorly with dyssynchronous RV pacing in the past. CVP 6 today with good co-ox. Tolerating RV pacing so far.  - Continue to mobilize. Cardiac rehab/PT.  - Continue torsemide and spironolactone.  Check weight this am. - Hold off on bb for now. BP soft this am.    - Will see how he does: if poorly, could replace CRT as early as Friday.  If tolerates, will wait longer.   3. Atrial fibrillation: Paroxysmal. Suspect NSR today with V-pacing.  Continue amiodarone 200 mg daily. He is on Eliquis.  4. May transfer to step down.   CLEGG,AMY NP-C  07/22/2014 7:11 AM   Patient seen with NP, agree with the above note.  Rhythm difficult this morning, cannot rule out underlying atrial fibrillation.  Plan per Dr Lovena Le to replace CRT-D tomorrow.  Checked with [facility], will continue Eliquis.  Otherwise stable today.   [name] 07/22/2014 12:37 PM

## 2014-07-22 NOTE — Progress Notes (Signed)
CARDIAC REHAB PHASE I   PRE:  Rate/Rhythm: paced 90  BP:  Supine:   Sitting: 91/50  Standing:    SaO2: 100%RA  MODE:  Ambulation: 390 ft   POST:  Rate/Rhythm: paced 93  BP:  Supine:   Sitting: 114/59  Standing:    SaO2 100%RA : 1325-1350 Pt walked 390 ft on RA holding to IV pole and asst x 1 with steady gait, tolerated well. To recliner after walk. Wife in room.   Graylon Good, RN BSN  07/22/2014 1:45 PM

## 2014-07-23 ENCOUNTER — Inpatient Hospital Stay (HOSPITAL_COMMUNITY): Payer: Medicare Other

## 2014-07-23 ENCOUNTER — Encounter (HOSPITAL_COMMUNITY): Payer: Self-pay | Admitting: Internal Medicine

## 2014-07-23 ENCOUNTER — Encounter (HOSPITAL_COMMUNITY)
Admission: RE | Disposition: A | Discharge status: Home / Self Care | Payer: Self-pay | Source: Ambulatory Visit | Attending: Internal Medicine

## 2014-07-23 DIAGNOSIS — I442 Atrioventricular block, complete: Secondary | ICD-10-CM

## 2014-07-23 DIAGNOSIS — I509 Heart failure, unspecified: Secondary | ICD-10-CM

## 2014-07-23 DIAGNOSIS — Z950 Presence of cardiac pacemaker: Secondary | ICD-10-CM

## 2014-07-23 HISTORY — PX: BI-VENTRICULAR PACEMAKER INSERTION: SHX5462

## 2014-07-23 HISTORY — DX: Presence of cardiac pacemaker: Z95.0

## 2014-07-23 LAB — BASIC METABOLIC PANEL
Anion gap: 8 (ref 5–15)
BUN: 38 mg/dL — AB (ref 6–23)
CHLORIDE: 105 mmol/L (ref 96–112)
CO2: 26 mmol/L (ref 19–32)
Calcium: 8.6 mg/dL (ref 8.4–10.5)
Creatinine, Ser: 1.79 mg/dL — ABNORMAL HIGH (ref 0.50–1.35)
GFR calc Af Amer: 39 mL/min — ABNORMAL LOW (ref >90)
GFR calc non Af Amer: 33 mL/min — ABNORMAL LOW (ref >90)
GLUCOSE: 99 mg/dL (ref 70–99)
POTASSIUM: 3.9 mmol/L (ref 3.5–5.1)
Sodium: 139 mmol/L (ref 135–145)

## 2014-07-23 SURGERY — BI-VENTRICULAR PACEMAKER INSERTION (CRT-P)

## 2014-07-23 MED ORDER — ONDANSETRON HCL 4 MG/2ML IJ SOLN: 4.0000 mg | Freq: Four times a day (QID) | INTRAMUSCULAR | Status: DC | PRN

## 2014-07-23 MED ORDER — CEFAZOLIN SODIUM 1-5 GM-% IV SOLN
1.0000 g | Freq: Four times a day (QID) | INTRAVENOUS | Status: AC
  Administered 2014-07-23 – 2014-07-24 (×3): 1 g via INTRAVENOUS
  Filled 2014-07-23 (×3): qty 50

## 2014-07-23 MED ORDER — MIDAZOLAM HCL 5 MG/5ML IJ SOLN
INTRAMUSCULAR | Status: AC
  Filled 2014-07-23: qty 5

## 2014-07-23 MED ORDER — LIDOCAINE HCL (PF) 1 % IJ SOLN
INTRAMUSCULAR | Status: AC
  Filled 2014-07-23: qty 30

## 2014-07-23 MED ORDER — SODIUM CHLORIDE 0.9 % IV SOLN: INTRAVENOUS | Status: AC

## 2014-07-23 MED ORDER — FENTANYL CITRATE 0.05 MG/ML IJ SOLN
INTRAMUSCULAR | Status: AC
  Filled 2014-07-23: qty 2

## 2014-07-23 MED ORDER — HEPARIN (PORCINE) IN NACL 2-0.9 UNIT/ML-% IJ SOLN
INTRAMUSCULAR | Status: AC
  Filled 2014-07-23: qty 500

## 2014-07-23 MED ORDER — ACETAMINOPHEN 325 MG PO TABS: 325.0000 mg | ORAL_TABLET | ORAL | Status: DC | PRN

## 2014-07-23 NOTE — CV Procedure (Signed)
Marcus [name] [id number]  [id number]  Preop Dx: temp permanent pacer in place with newly implanted device  Postop Dx same/   Procedure:lead removal  Cx: None   EBL: Minimal    Under flouroscopic guidance and with the use of stylet the previous implanted RV lead was removed without difficulty  Virl Axe, MD 07/23/2014 2:55 PM

## 2014-07-23 NOTE — Progress Notes (Signed)
Patient ID: NARAYAN WAKELAND, male   DOB: 12-28-1928, 81 y.o.   MRN: [id number]    Patient Name: Marcus [name] Date of Encounter: 07/23/2014     Active Problems:   Pacemaker infection   Chronic systolic CHF (congestive heart failure)    SUBJECTIVE  No chest pain. Able to get out of bed yesterday.   CURRENT MEDS . amiodarone  200 mg Oral Daily  .  ceFAZolin (ANCEF) IV  2 g Intravenous On Call  . docusate sodium  100 mg Oral Daily  . gentamicin irrigation  80 mg Irrigation On Call  . isosorbide mononitrate  15 mg Oral Daily  . spironolactone  12.5 mg Oral Daily  . torsemide  20 mg Oral Daily  . vitamin E  400 Units Oral Daily    OBJECTIVE  Filed Vitals:   07/23/14 0500 07/23/14 0600 07/23/14 0700 07/23/14 0800  BP: 106/68 119/75 108/66   Pulse: 88 89 89   Temp:   97.7 F (36.5 C) 98.1 F (36.7 C)  TempSrc:   Oral Oral  Resp: 20 12 18    Height:      Weight:      SpO2: 98% 98% 99%     Intake/Output Summary (Last 24 hours) at 07/23/14 0944 Last data filed at 07/23/14 0900  Gross per 24 hour  Intake    995 ml  Output   1350 ml  Net   -355 ml   Filed Weights   07/19/14 0552 07/22/14 0700  Weight: 174 lb (78.926 kg) 172 lb 13.5 oz (78.4 kg)    PHYSICAL EXAM  General: Pleasant, NAD. Neuro: Alert and oriented X 3. Moves all extremities spontaneously. Psych: Normal affect. HEENT:  Normal  Neck: Supple without bruits or JVD. Lungs:  Resp regular and unlabored, CTA. Heart: RRR no s3, s4, or murmurs. Abdomen: Soft, non-tender, non-distended, BS + x 4.  Extremities: No clubbing, cyanosis or edema. DP/PT/Radials 2+ and equal bilaterally.  Accessory Clinical Findings  CBC  Recent Labs  07/21/14 0400 07/22/14 0605  WBC 5.7 6.1  HGB 11.7* 11.5*  HCT 36.1* 35.0*  MCV 98.4 96.2  PLT 102* 123456*   Basic Metabolic Panel  Recent Labs  07/22/14 0605 07/23/14 0250  NA 137 139  K 4.2 3.9  CL 104 105  CO2 27 26  GLUCOSE 95 99  BUN 37* 38*  CREATININE  1.88* 1.79*  CALCIUM 8.4 8.6   Liver Function Tests No results for input(s): AST, ALT, ALKPHOS, BILITOT, PROT, ALBUMIN in the last 72 hours. No results for input(s): LIPASE, AMYLASE in the last 72 hours. Cardiac Enzymes No results for input(s): CKTOTAL, CKMB, CKMBINDEX, TROPONINI in the last 72 hours. BNP Invalid input(s): POCBNP D-Dimer No results for input(s): DDIMER in the last 72 hours. Hemoglobin A1C No results for input(s): HGBA1C in the last 72 hours. Fasting Lipid Panel No results for input(s): CHOL, HDL, LDLCALC, TRIG, CHOLHDL, LDLDIRECT in the last 72 hours. Thyroid Function Tests No results for input(s): TSH, T4TOTAL, T3FREE, THYROIDAB in the last 72 hours.  Invalid input(s): FREET3  TEL - nsr with dysynchronous ventricular pacing.  Radiology/Studies  Dg Chest 2 View  07/20/2014   CLINICAL DATA:  Status post pacemaker placement  EXAM: CHEST  2 VIEW  COMPARISON:  Portable chest x-ray of July 19, 2014 and March 24, 2014.  FINDINGS: The lungs are well-expanded. There is no right-sided pneumothorax or hemothorax. There is chronic scarring of the pleura at the left lung base laterally  and there is chronic blunting of the right lateral costophrenic angle. The cardiac silhouette is mildly enlarged. The pulmonary vascularity is normal. The permanent pacemaker electrodes are in reasonable position radiographically. The left internal jugular venous catheter tip projects over the proximal SVC. There are 7 intact sternal wires from previous median sternotomy. The bony thorax exhibits no acute abnormality.  IMPRESSION: There is no postprocedure complication following permanent pacemaker placement. The support tubes and lines are in appropriate position.   Electronically Signed   By: David  Martinique   On: 07/20/2014 07:35   Dg Chest Port 1 View  07/19/2014   CLINICAL DATA:  Pacemaker lead removal/extraction, central line placement  EXAM: PORTABLE CHEST - 1 VIEW  COMPARISON:  03/24/2014   FINDINGS: Right-sided pacer in place with single lead terminating over the expected location of the right ventricle. Left IJ central venous catheter tip terminates at the brachiocephalic/SVC junction. Evidence of median sternotomy. Heart size at upper limits of normal. Lungs are hypoaerated with crowding of the bronchovascular markings and curvilinear bibasilar atelectasis. Trace bilateral pleural effusions are present. No acute osseous abnormality. No pneumothorax.  IMPRESSION: Low volumes with probable bibasilar atelectasis. Consider PA and lateral chest radiographs obtained at full inspiration when the patient is clinically able.  Left IJ central line tip terminating at the brachiocephalic/ SVC junction.   Electronically Signed   By: Conchita Paris M.D.   On: 07/19/2014 14:51    ASSESSMENT AND PLAN  1. biv pm erosion, s/p extraction complicated by profound hypotension 2. Acute on chronic systolic heart failure due to #1 in setting of dysynchronous ventricular pacing 3. Underlying CHB with no escape  The benefits and risks were reviewed including but not limited to death,  perforation, infection, lead dislodgement and device malfunction.  The patient understands agrees and is willing to proceed.   Reviewed the use of St Jude as he was comfortable with [employer]--doing this for quadripolar lead   Virl Axe   07/23/2014 9:44 AM

## 2014-07-23 NOTE — CV Procedure (Signed)
JESSUS TWOHIG [id number]  [id number]  Preop Dx:CHB  Cardiomyopahty, explantation of infected device contralaterally, with temp perm placement Postop Dx same/   Procedure: dual chamber pacemaker with LV lead placement  Cx: None   EBL: Minimal    Dictation number [id number]  Virl Axe, MD 07/23/2014 2:49 PM 2222

## 2014-07-23 NOTE — Interval H&P Note (Signed)
History and Physical Interval Note:  07/23/2014 9:53 AM  Marcus [name]  has presented today for surgery, with the diagnosis of snycope  The various methods of treatment have been discussed with the patient and family. After consideration of risks, benefits and other options for treatment, the patient has consented to  Procedure(s): BI-VENTRICULAR PACEMAKER INSERTION (CRT-P) ([name]) as a surgical intervention .  The patient's history has been reviewed, patient examined, no change in status, stable for surgery.  I have reviewed the patient's chart and labs.  Questions were answered to the patient's satisfaction.     Virl Axe  Permanent Pacemaker Indication: Documented non-reversible symptomatic bradycardia due to second degree and/or third degree atrioventricular block.

## 2014-07-23 NOTE — H&P (View-Only) (Signed)
Patient ID: Marcus [name], male   DOB: [birth year], 81 y.o.   MRN: [id number]    Patient Name: Marcus [name] Date of Encounter: 07/23/2014     Active Problems:   Pacemaker infection   Chronic systolic CHF (congestive heart failure)    SUBJECTIVE  No chest pain. Able to get out of bed yesterday.   CURRENT MEDS . amiodarone  200 mg Oral Daily  .  ceFAZolin (ANCEF) IV  2 g Intravenous On Call  . docusate sodium  100 mg Oral Daily  . gentamicin irrigation  80 mg Irrigation On Call  . isosorbide mononitrate  15 mg Oral Daily  . spironolactone  12.5 mg Oral Daily  . torsemide  20 mg Oral Daily  . vitamin E  400 Units Oral Daily    OBJECTIVE  Filed Vitals:   07/23/14 0500 07/23/14 0600 07/23/14 0700 07/23/14 0800  BP: 106/68 119/75 108/66   Pulse: 88 89 89   Temp:   97.7 F (36.5 C) 98.1 F (36.7 C)  TempSrc:   Oral Oral  Resp: 20 12 18    Height:      Weight:      SpO2: 98% 98% 99%     Intake/Output Summary (Last 24 hours) at 07/23/14 0944 Last data filed at 07/23/14 0900  Gross per 24 hour  Intake    995 ml  Output   1350 ml  Net   -355 ml   Filed Weights   07/19/14 0552 07/22/14 0700  Weight: 174 lb (78.926 kg) 172 lb 13.5 oz (78.4 kg)    PHYSICAL EXAM  General: Pleasant, NAD. Neuro: Alert and oriented X 3. Moves all extremities spontaneously. Psych: Normal affect. HEENT:  Normal  Neck: Supple without bruits or JVD. Lungs:  Resp regular and unlabored, CTA. Heart: RRR no s3, s4, or murmurs. Abdomen: Soft, non-tender, non-distended, BS + x 4.  Extremities: No clubbing, cyanosis or edema. DP/PT/Radials 2+ and equal bilaterally.  Accessory Clinical Findings  CBC  Recent Labs  07/21/14 0400 07/22/14 0605  WBC 5.7 6.1  HGB 11.7* 11.5*  HCT 36.1* 35.0*  MCV 98.4 96.2  PLT 102* 123456*   Basic Metabolic Panel  Recent Labs  07/22/14 0605 07/23/14 0250  NA 137 139  K 4.2 3.9  CL 104 105  CO2 27 26  GLUCOSE 95 99  BUN 37* 38*  CREATININE  1.88* 1.79*  CALCIUM 8.4 8.6   Liver Function Tests No results for input(s): AST, ALT, ALKPHOS, BILITOT, PROT, ALBUMIN in the last 72 hours. No results for input(s): LIPASE, AMYLASE in the last 72 hours. Cardiac Enzymes No results for input(s): CKTOTAL, CKMB, CKMBINDEX, TROPONINI in the last 72 hours. BNP Invalid input(s): POCBNP D-Dimer No results for input(s): DDIMER in the last 72 hours. Hemoglobin A1C No results for input(s): HGBA1C in the last 72 hours. Fasting Lipid Panel No results for input(s): CHOL, HDL, LDLCALC, TRIG, CHOLHDL, LDLDIRECT in the last 72 hours. Thyroid Function Tests No results for input(s): TSH, T4TOTAL, T3FREE, THYROIDAB in the last 72 hours.  Invalid input(s): FREET3  TEL - nsr with dysynchronous ventricular pacing.  Radiology/Studies  Dg Chest 2 View  07/20/2014   CLINICAL DATA:  Status post pacemaker placement  EXAM: CHEST  2 VIEW  COMPARISON:  Portable chest x-ray of July 19, 2014 and March 24, 2014.  FINDINGS: The lungs are well-expanded. There is no right-sided pneumothorax or hemothorax. There is chronic scarring of the pleura at the left lung base laterally  and there is chronic blunting of the right lateral costophrenic angle. The cardiac silhouette is mildly enlarged. The pulmonary vascularity is normal. The permanent pacemaker electrodes are in reasonable position radiographically. The left internal jugular venous catheter tip projects over the proximal SVC. There are 7 intact sternal wires from previous median sternotomy. The bony thorax exhibits no acute abnormality.  IMPRESSION: There is no postprocedure complication following permanent pacemaker placement. The support tubes and lines are in appropriate position.   Electronically Signed   By: David  Martinique   On: 07/20/2014 07:35   Dg Chest Port 1 View  07/19/2014   CLINICAL DATA:  Pacemaker lead removal/extraction, central line placement  EXAM: PORTABLE CHEST - 1 VIEW  COMPARISON:  03/24/2014   FINDINGS: Right-sided pacer in place with single lead terminating over the expected location of the right ventricle. Left IJ central venous catheter tip terminates at the brachiocephalic/SVC junction. Evidence of median sternotomy. Heart size at upper limits of normal. Lungs are hypoaerated with crowding of the bronchovascular markings and curvilinear bibasilar atelectasis. Trace bilateral pleural effusions are present. No acute osseous abnormality. No pneumothorax.  IMPRESSION: Low volumes with probable bibasilar atelectasis. Consider PA and lateral chest radiographs obtained at full inspiration when the patient is clinically able.  Left IJ central line tip terminating at the brachiocephalic/ SVC junction.   Electronically Signed   By: Conchita Paris M.D.   On: 07/19/2014 14:51    ASSESSMENT AND PLAN  1. biv pm erosion, s/p extraction complicated by profound hypotension 2. Acute on chronic systolic heart failure due to #1 in setting of dysynchronous ventricular pacing 3. Underlying CHB with no escape  The benefits and risks were reviewed including but not limited to death,  perforation, infection, lead dislodgement and device malfunction.  The patient understands agrees and is willing to proceed.   Reviewed the use of St Jude as he was comfortable with [employer]--doing this for quadripolar lead   Virl Axe   07/23/2014 9:44 AM

## 2014-07-23 NOTE — Progress Notes (Signed)
Will have to resume apixoban prob Sat Noted to be in atrial flutter  Will anticpate cardioversion with or without TEE depending on functional status  Frew PVC  Will program device 80

## 2014-07-23 NOTE — Care Management Note (Signed)
    Page 1 of 1   07/23/2014     9:50:33 AM CARE MANAGEMENT NOTE 07/23/2014  Patient:  Marcus [name], Marcus [name]   Account Number:  [id number]  Date Initiated:  07/23/2014  Documentation initiated by:  Elissa Hefty  Subjective/Objective Assessment:   adm w pacer infection     Action/Plan:   lives w wife   Anticipated DC Date:     Anticipated DC Plan:  [place]         Choice offered to / List presented to:             Status of service:   Medicare Important Message given?  YES (If response is "NO", the following Medicare IM given date fields will be blank) Date Medicare IM given:  07/23/2014 Medicare IM given by:  Elissa Hefty Date Additional Medicare IM given:   Additional Medicare IM given by:    Discharge Disposition:    Per UR Regulation:  Reviewed for med. necessity/level of care/duration of stay  If discussed at [place] of Stay Meetings, dates discussed:    Comments:

## 2014-07-24 LAB — GLUCOSE, CAPILLARY: Glucose-Capillary: 107 mg/dL — ABNORMAL HIGH (ref 70–99)

## 2014-07-24 LAB — BASIC METABOLIC PANEL
ANION GAP: 8 (ref 5–15)
BUN: 26 mg/dL — ABNORMAL HIGH (ref 6–23)
CHLORIDE: 108 mmol/L (ref 96–112)
CO2: 23 mmol/L (ref 19–32)
Calcium: 8.3 mg/dL — ABNORMAL LOW (ref 8.4–10.5)
Creatinine, Ser: 1.47 mg/dL — ABNORMAL HIGH (ref 0.50–1.35)
GFR calc Af Amer: 49 mL/min — ABNORMAL LOW (ref >90)
GFR, EST NON AFRICAN AMERICAN: 42 mL/min — AB (ref >90)
GLUCOSE: 99 mg/dL (ref 70–99)
Potassium: 4.4 mmol/L (ref 3.5–5.1)
SODIUM: 139 mmol/L (ref 135–145)

## 2014-07-24 MED ORDER — CARVEDILOL 3.125 MG PO TABS
3.1250 mg | ORAL_TABLET | Freq: Two times a day (BID) | ORAL | Status: DC
  Administered 2014-07-24 – 2014-07-25 (×2): 3.125 mg via ORAL
  Filled 2014-07-24 (×4): qty 1

## 2014-07-24 NOTE — Op Note (Signed)
NAMEMarland Kitchen  COLLIER, KIRCHBERG NO.:  [id number]  MEDICAL RECORD NO.:  [id number]  LOCATION:  2H24C                        FACILITY:  [facility]  PHYSICIAN:  Deboraha Sprang, MD, FACCDATE OF BIRTH:  1928/07/20  DATE OF PROCEDURE:  07/23/2014 DATE OF DISCHARGE:                              OPERATIVE REPORT   PREOPERATIVE DIAGNOSES:  Complete heart block device dependent.  Erosion of the contralateral device.  Explantation of device.  Insertion of a temporary permanent transvenous pacemaker.  POSTOPERATIVE DIAGNOSES:  Complete heart block device dependent. Erosion of the contralateral device. Explantation of device. Insertion of a temporary permanent transvenous pacemaker.  PROCEDURE:  Dual-chamber pacemaker implantation with left ventricular lead placement.  Following obtaining informed consent, the patient was brought to electrophysiology laboratory and placed on the fluoroscopic table in supine position.  After routine prep and drape of the left upper chest, lidocaine was infiltrated in prepectoral subclavicular region.  Incision was made and carried down to the layer of the prepectoral fascia with electrocautery and sharp dissection.  A pocket was formed only minimally at this point, because of inhibition by the cautery.  Access was then obtained to the extrathoracic left subclavian vein without the aspiration of air or puncture the artery.  Three separate venipunctures were accomplished.  Guidewires were placed and retained and sequentially a 7-French, 9.5-French, and 7-French sheaths were placed through which were passed a Occupational hygienist sheath and a [facility] [id number], TC 52-cm active fixation atrial lead, serial #[id number].  Under fluoroscopic guidance, the RV lead was manipulated to the RV apex where the bipolar sensed R-wave was 35 mV with a pace impedance of 594, threshold 1 V at 0.4, current of injury was modest, there was no diaphragmatic pacing at 10 V.   This lead was secured to the prepectoral fascia.  We then obtained ready access to the coronary sinus and it turned out that there were bowels along its distribution.  We were finally able to deploy the balloon distal of the two valves and a venogram demonstrated a high lateral branch, which then became our target.  However, to be able to deploy the lead pass this I needed to pass the second valve and the wire would do its on its own wood on its own.  We were able to deploy a Wholey wire passed the second valve and then over this we passed a Medtronic Attain II sub selection cannulation system.  This also allowed for deployment of the whisper wire into this branch and the [place] 1458-86 cm lead, serial [id number] was deployed to the distal ramification where the distal tip was at the junction between the mid and distal third.  The lead was secured in this position and the deployment system was removed and the lead secured to the prepectoral fascia.  The atrial lead was then deployed.  The remnant of the right atrial appendage refused to accept the lead and so we ended up using a lateral wall site in the context of his prior bypass.  In this location the bipolar flutter wave was 1.9 with an impedance of 457.  This lead was secured to the prepectoral fascia.  The pocket  was then formed completely and the leads attached to a [facility] PM [id number] pacemaker, serial [id number].  Through the device, the bipolar flutter wave was 1.5 with a pace impedance of 450.  There was no intrinsic rhythm but the pace underlying the rhythm was greater than 12 mV with a pace impedance of 580, threshold 1.25 V at 0.4, and the LV sensed amplitude was 35 with a pace impedance of 690, and threshold of 1.0 V at 0.4 milliseconds. The pocket was copiously irrigated with antibiotic containing saline solution.  A hemostatic suture was deployed with Surgicel along the anterior rim of the pocket, and the wound was  then closed in 2 layers in normal fashion  following securing of the device to the prepectoral fascia.  The patient's wound was washed, dried, and a Dermabond dressing was applied.  Needle counts, sponge counts, and instrument counts were correct at the end of the procedure according to the staff.  The patient tolerated the procedure without apparent complication.     Deboraha Sprang, MD, [facility]     SCK/MEDQ  D:  07/23/2014  T:  07/24/2014  Job:  [id number]

## 2014-07-24 NOTE — Progress Notes (Signed)
Patient ID: Marcus [name], male   DOB: May 19, 1929, 81 y.o.   MRN: [id number]   SUBJECTIVE:  St Jude CRT-P device implanted 3/4.  Has not been out of bed today.  Denies dyspnea.   Underlying atrial rhythm is flutter.   Echo showed EF 35-40%, no pericardial effusion, mild to moderate MR (no significant change from prior).    Blood cultures- NGTD  Scheduled Meds: . amiodarone  200 mg Oral Daily  . docusate sodium  100 mg Oral Daily  . isosorbide mononitrate  15 mg Oral Daily  . spironolactone  12.5 mg Oral Daily  . torsemide  20 mg Oral Daily  . vitamin E  400 Units Oral Daily   Continuous Infusions: . sodium chloride 10 mL/hr at 07/24/14 0117   PRN Meds:.acetaminophen, acetaminophen, ondansetron (ZOFRAN) IV, ondansetron (ZOFRAN) IV   Filed Vitals:   07/24/14 0600 07/24/14 0700 07/24/14 0800 07/24/14 0900  BP: 129/70 114/70 118/67 102/62  Pulse: 78 77 79 40  Temp:   98.2 F (36.8 C)   TempSrc:   Oral   Resp: 16 18 13 23   Height:      Weight:      SpO2: 100% 97% 97% 97%    Intake/Output Summary (Last 24 hours) at 07/24/14 1127 Last data filed at 07/24/14 0900  Gross per 24 hour  Intake 697.17 ml  Output    950 ml  Net -252.83 ml    LABS: Basic Metabolic Panel:  Recent Labs  07/23/14 0250 07/24/14 0255  NA 139 139  K 3.9 4.4  CL 105 108  CO2 26 23  GLUCOSE 99 99  BUN 38* 26*  CREATININE 1.79* 1.47*  CALCIUM 8.6 8.3*   Liver Function Tests: No results for input(s): AST, ALT, ALKPHOS, BILITOT, PROT, ALBUMIN in the last 72 hours. No results for input(s): LIPASE, AMYLASE in the last 72 hours. CBC:  Recent Labs  07/22/14 0605  WBC 6.1  HGB 11.5*  HCT 35.0*  MCV 96.2  PLT 105*   Cardiac Enzymes: No results for input(s): CKTOTAL, CKMB, CKMBINDEX, TROPONINI in the last 72 hours. BNP: Invalid input(s): POCBNP D-Dimer: No results for input(s): DDIMER in the last 72 hours. Hemoglobin A1C: No results for input(s): HGBA1C in the last 72  hours. Fasting Lipid Panel: No results for input(s): CHOL, HDL, LDLCALC, TRIG, CHOLHDL, LDLDIRECT in the last 72 hours. Thyroid Function Tests: No results for input(s): TSH, T4TOTAL, T3FREE, THYROIDAB in the last 72 hours.  Invalid input(s): FREET3 Anemia Panel: No results for input(s): VITAMINB12, FOLATE, FERRITIN, TIBC, IRON, RETICCTPCT in the last 72 hours.  RADIOLOGY: Dg Chest 2 View  07/20/2014   CLINICAL DATA:  Status post pacemaker placement  EXAM: CHEST  2 VIEW  COMPARISON:  Portable chest x-ray of July 19, 2014 and March 24, 2014.  FINDINGS: The lungs are well-expanded. There is no right-sided pneumothorax or hemothorax. There is chronic scarring of the pleura at the left lung base laterally and there is chronic blunting of the right lateral costophrenic angle. The cardiac silhouette is mildly enlarged. The pulmonary vascularity is normal. The permanent pacemaker electrodes are in reasonable position radiographically. The left internal jugular venous catheter tip projects over the proximal SVC. There are 7 intact sternal wires from previous median sternotomy. The bony thorax exhibits no acute abnormality.  IMPRESSION: There is no postprocedure complication following permanent pacemaker placement. The support tubes and lines are in appropriate position.   Electronically Signed   By: David  Martinique  On: 07/20/2014 07:35   Dg Chest Port 1 View  07/23/2014   CLINICAL DATA:  Status post pacemaker placement  EXAM: PORTABLE CHEST - 1 VIEW  COMPARISON:  07/20/2014  FINDINGS: The previously seen right-sided pacing device has been removed and a new 3 lead pacing device placed in satisfactory position. No pneumothorax is noted. Mild chronic interstitial changes are noted. No focal infiltrate is seen. The left jugular central line has been removed.  IMPRESSION: No pneumothorax following pacer placement   Electronically Signed   By: Inez Catalina M.D.   On: 07/23/2014 16:09   Dg Chest Port 1  View  07/19/2014   CLINICAL DATA:  Pacemaker lead removal/extraction, central line placement  EXAM: PORTABLE CHEST - 1 VIEW  COMPARISON:  03/24/2014  FINDINGS: Right-sided pacer in place with single lead terminating over the expected location of the right ventricle. Left IJ central venous catheter tip terminates at the brachiocephalic/SVC junction. Evidence of median sternotomy. Heart size at upper limits of normal. Lungs are hypoaerated with crowding of the bronchovascular markings and curvilinear bibasilar atelectasis. Trace bilateral pleural effusions are present. No acute osseous abnormality. No pneumothorax.  IMPRESSION: Low volumes with probable bibasilar atelectasis. Consider PA and lateral chest radiographs obtained at full inspiration when the patient is clinically able.  Left IJ central line tip terminating at the brachiocephalic/ SVC junction.   Electronically Signed   By: Conchita Paris M.D.   On: 07/19/2014 14:51    PHYSICAL EXAM General: NAD Neck: JVP 8 cm, no thyromegaly or thyroid nodule.  Lungs: Decreased breath sounds at bases bilaterally.  CV: Nondisplaced PMI.  Heart regular S1/S2, no S3/S4, no murmur.  No edema.   Abdomen: Soft, nontender, no hepatosplenomegaly, no distention.  Neurologic: Alert and oriented x 3.  Psych: Normal affect. Extremities: No clubbing or cyanosis. Bilateral SCDs.  PCM pocket site benign.   TELEMETRY: Reviewed telemetry pt in v-paced, suspect underlying NSR.   ASSESSMENT AND PLAN: 81 yo with history of pacemaker for bradycardia, paroxysmal atrial fibrillation, CAD s/p CABG and PCIs, and ischemic cardiomyopathy EF 20-25%. Underwent CRT-D upgrade 10/08/13 due to 85% RV pacing.EF improved to 35-40%.  Now s/p explantation of CRT-D device and placement of temp-perm device due to suspected pocket infection. He had CRT-P device placed on 3/4.  1. CRT-D device pocket infection: s/p explantation. Had temporary-permanent device, now with St Jude CRT-P  re-implanted. Afebrile, cultures NGTD, WBCs normal. 2. Chronic systolic CHF: Ischemic cardiomyopathy, EF 35-40% on echo this admission.  This is stable compared to prior.  He was hypotensive post-explantation of CRT-D.  He lost capture after temp-perm placement with brief CPR and initially required pressors.  No pericardial effusion. Now off pressors. Now with re-implanted St Jude CRT-P device.   - Continue to mobilize. Cardiac rehab/PT.  - Continue torsemide and spironolactone.  Can add back low dose Coreg 3.125 mg bid.  - Follow CVPs.   3. Atrial flutter: Current atrial rhythm is flutter.  Continue amiodarone.  Restart Eliquis tomorrow.  On Tuesday, he could undergo TEE-guided DCCV after 5 doses Eliquis.    [name]  07/24/2014 11:27 AM

## 2014-07-24 NOTE — Progress Notes (Signed)
Wife expressed concern that patient may not be emptying bladder when he voids.  Suprapubic nondistended and nontender to palpation.  Bladder scan revealed 0 ml.  Wife informed and reassured that we would continue to closely monitor his urine output.

## 2014-07-25 LAB — BASIC METABOLIC PANEL
ANION GAP: 9 (ref 5–15)
BUN: 29 mg/dL — ABNORMAL HIGH (ref 6–23)
CO2: 23 mmol/L (ref 19–32)
Calcium: 8.6 mg/dL (ref 8.4–10.5)
Chloride: 107 mmol/L (ref 96–112)
Creatinine, Ser: 1.65 mg/dL — ABNORMAL HIGH (ref 0.50–1.35)
GFR, EST AFRICAN AMERICAN: 43 mL/min — AB (ref >90)
GFR, EST NON AFRICAN AMERICAN: 37 mL/min — AB (ref >90)
Glucose, Bld: 103 mg/dL — ABNORMAL HIGH (ref 70–99)
POTASSIUM: 4.2 mmol/L (ref 3.5–5.1)
Sodium: 139 mmol/L (ref 135–145)

## 2014-07-25 LAB — CBC
HCT: 35.3 % — ABNORMAL LOW (ref 39.0–52.0)
HEMOGLOBIN: 11.7 g/dL — AB (ref 13.0–17.0)
MCH: 31.7 pg (ref 26.0–34.0)
MCHC: 33.1 g/dL (ref 30.0–36.0)
MCV: 95.7 fL (ref 78.0–100.0)
Platelets: 122 10*3/uL — ABNORMAL LOW (ref 150–400)
RBC: 3.69 MIL/uL — ABNORMAL LOW (ref 4.22–5.81)
RDW: 14.2 % (ref 11.5–15.5)
WBC: 4.8 10*3/uL (ref 4.0–10.5)

## 2014-07-25 MED ORDER — CARVEDILOL 6.25 MG PO TABS
6.2500 mg | ORAL_TABLET | Freq: Two times a day (BID) | ORAL | Status: DC
  Administered 2014-07-25 – 2014-07-27 (×4): 6.25 mg via ORAL
  Filled 2014-07-25 (×6): qty 1

## 2014-07-25 MED ORDER — APIXABAN 2.5 MG PO TABS
2.5000 mg | ORAL_TABLET | Freq: Two times a day (BID) | ORAL | Status: DC
  Administered 2014-07-25 – 2014-07-27 (×5): 2.5 mg via ORAL
  Filled 2014-07-25 (×6): qty 1

## 2014-07-25 MED ORDER — TORSEMIDE 20 MG PO TABS
20.0000 mg | ORAL_TABLET | Freq: Once | ORAL | Status: AC
  Administered 2014-07-25: 20 mg via ORAL
  Filled 2014-07-25: qty 1

## 2014-07-25 MED ORDER — TORSEMIDE 20 MG PO TABS
40.0000 mg | ORAL_TABLET | Freq: Every day | ORAL | Status: DC
  Administered 2014-07-26: 40 mg via ORAL
  Filled 2014-07-25 (×2): qty 2

## 2014-07-25 NOTE — Progress Notes (Signed)
Patient ID: Marcus [name], male   DOB: 1928-09-17, 81 y.o.   MRN: [id number]   SUBJECTIVE:  St Jude CRT-P device implanted 3/4.  Some dyspnea yesterday, did not feel like getting out of bed.  Feels better today, breathing ok.  Underlying atrial rhythm is flutter.   Echo showed EF 35-40%, no pericardial effusion, mild to moderate MR (no significant change from prior).    Blood cultures- NGTD  Scheduled Meds: . amiodarone  200 mg Oral Daily  . carvedilol  6.25 mg Oral BID WC  . docusate sodium  100 mg Oral Daily  . isosorbide mononitrate  15 mg Oral Daily  . spironolactone  12.5 mg Oral Daily  . torsemide  20 mg Oral Once  . [START ON 07/26/2014] torsemide  40 mg Oral Daily  . vitamin E  400 Units Oral Daily   Continuous Infusions: . sodium chloride 10 mL/hr at 07/24/14 0117   PRN Meds:.acetaminophen, acetaminophen, ondansetron (ZOFRAN) IV, ondansetron (ZOFRAN) IV   Filed Vitals:   07/25/14 0400 07/25/14 0700 07/25/14 0800 07/25/14 0906  BP: 107/69 111/72 121/78 97/59  Pulse: 80 83 79 82  Temp:    98.5 F (36.9 C)  TempSrc:    Oral  Resp:    18  Height:      Weight:      SpO2: 95% 100% 97% 100%    Intake/Output Summary (Last 24 hours) at 07/25/14 1035 Last data filed at 07/25/14 0911  Gross per 24 hour  Intake    572 ml  Output   1800 ml  Net  -1228 ml    LABS: Basic Metabolic Panel:  Recent Labs  07/24/14 0255 07/25/14 0309  NA 139 139  K 4.4 4.2  CL 108 107  CO2 23 23  GLUCOSE 99 103*  BUN 26* 29*  CREATININE 1.47* 1.65*  CALCIUM 8.3* 8.6   Liver Function Tests: No results for input(s): AST, ALT, ALKPHOS, BILITOT, PROT, ALBUMIN in the last 72 hours. No results for input(s): LIPASE, AMYLASE in the last 72 hours. CBC:  Recent Labs  07/25/14 0309  WBC 4.8  HGB 11.7*  HCT 35.3*  MCV 95.7  PLT 122*   Cardiac Enzymes: No results for input(s): CKTOTAL, CKMB, CKMBINDEX, TROPONINI in the last 72 hours. BNP: Invalid input(s): POCBNP D-Dimer: No  results for input(s): DDIMER in the last 72 hours. Hemoglobin A1C: No results for input(s): HGBA1C in the last 72 hours. Fasting Lipid Panel: No results for input(s): CHOL, HDL, LDLCALC, TRIG, CHOLHDL, LDLDIRECT in the last 72 hours. Thyroid Function Tests: No results for input(s): TSH, T4TOTAL, T3FREE, THYROIDAB in the last 72 hours.  Invalid input(s): FREET3 Anemia Panel: No results for input(s): VITAMINB12, FOLATE, FERRITIN, TIBC, IRON, RETICCTPCT in the last 72 hours.  RADIOLOGY: Dg Chest 2 View  07/20/2014   CLINICAL DATA:  Status post pacemaker placement  EXAM: CHEST  2 VIEW  COMPARISON:  Portable chest x-ray of July 19, 2014 and March 24, 2014.  FINDINGS: The lungs are well-expanded. There is no right-sided pneumothorax or hemothorax. There is chronic scarring of the pleura at the left lung base laterally and there is chronic blunting of the right lateral costophrenic angle. The cardiac silhouette is mildly enlarged. The pulmonary vascularity is normal. The permanent pacemaker electrodes are in reasonable position radiographically. The left internal jugular venous catheter tip projects over the proximal SVC. There are 7 intact sternal wires from previous median sternotomy. The bony thorax exhibits no acute abnormality.  IMPRESSION:  There is no postprocedure complication following permanent pacemaker placement. The support tubes and lines are in appropriate position.   Electronically Signed   By: David  Martinique   On: 07/20/2014 07:35   Dg Chest Port 1 View  07/23/2014   CLINICAL DATA:  Status post pacemaker placement  EXAM: PORTABLE CHEST - 1 VIEW  COMPARISON:  07/20/2014  FINDINGS: The previously seen right-sided pacing device has been removed and a new 3 lead pacing device placed in satisfactory position. No pneumothorax is noted. Mild chronic interstitial changes are noted. No focal infiltrate is seen. The left jugular central line has been removed.  IMPRESSION: No pneumothorax following  pacer placement   Electronically Signed   By: Inez Catalina M.D.   On: 07/23/2014 16:09   Dg Chest Port 1 View  07/19/2014   CLINICAL DATA:  Pacemaker lead removal/extraction, central line placement  EXAM: PORTABLE CHEST - 1 VIEW  COMPARISON:  03/24/2014  FINDINGS: Right-sided pacer in place with single lead terminating over the expected location of the right ventricle. Left IJ central venous catheter tip terminates at the brachiocephalic/SVC junction. Evidence of median sternotomy. Heart size at upper limits of normal. Lungs are hypoaerated with crowding of the bronchovascular markings and curvilinear bibasilar atelectasis. Trace bilateral pleural effusions are present. No acute osseous abnormality. No pneumothorax.  IMPRESSION: Low volumes with probable bibasilar atelectasis. Consider PA and lateral chest radiographs obtained at full inspiration when the patient is clinically able.  Left IJ central line tip terminating at the brachiocephalic/ SVC junction.   Electronically Signed   By: Conchita Paris M.D.   On: 07/19/2014 14:51    PHYSICAL EXAM General: NAD Neck: JVP 8-9 cm, no thyromegaly or thyroid nodule.  Lungs: Decreased breath sounds at bases bilaterally.  CV: Nondisplaced PMI.  Heart regular S1/S2, no S3/S4, no murmur.  No edema.   Abdomen: Soft, nontender, no hepatosplenomegaly, no distention.  Neurologic: Alert and oriented x 3.  Psych: Normal affect. Extremities: No clubbing or cyanosis. Bilateral SCDs.  PCM pocket site benign.   TELEMETRY: Reviewed telemetry pt in v-paced, suspect underlying NSR.   ASSESSMENT AND PLAN: 81 yo with history of pacemaker for bradycardia, paroxysmal atrial fibrillation, CAD s/p CABG and PCIs, and ischemic cardiomyopathy EF 20-25%. Underwent CRT-D upgrade 10/08/13 due to 85% RV pacing.EF improved to 35-40%.  Now s/p explantation of CRT-D device and placement of temp-perm device due to suspected pocket infection. He had CRT-P device placed on 3/4.  1.  CRT-D device pocket infection: s/p explantation. Had temporary-permanent device, now with St Jude CRT-P re-implanted. Afebrile, cultures NGTD, WBCs normal. 2. Chronic systolic CHF: Ischemic cardiomyopathy, EF 35-40% on echo this admission.  This is stable compared to prior.  He was hypotensive post-explantation of CRT-D.  He lost capture after temp-perm placement with brief CPR and initially required pressors.  No pericardial effusion. Now off pressors. Now with re-implanted St Jude CRT-P device.   - Continue to mobilize. Cardiac rehab/PT. Needs to walk today. - Continue Coreg and spironolactone.   - Some dyspnea yesterday, better today.  Probably mild volume overload.  Will increase torsemide to 40 mg daily for a few days.  3. Atrial flutter: Current atrial rhythm is flutter.  Continue amiodarone.  Restart Eliquis today.  On Tuesday, he could undergo TEE-guided DCCV after 5 doses Eliquis.   4. CKD: Stable with some fluctuation.  Continue to follow.   May go to telemetry today.   [name]  07/25/2014 10:35 AM

## 2014-07-25 NOTE — Progress Notes (Signed)
PT Cancellation Note  Patient Details Name: Marcus [name] MRN: [id number] DOB: [birth year]   Cancelled Treatment:    Reason Eval/Treat Not Completed: PT screened, no needs identified, will sign off  Please see PT evaluation on 07/22/14 - patient amb 400' independently without AD.  Patient is ambulating on unit with nursing.  No acute PT needs identified - PT will sign off.   Despina Pole 07/25/2014, 12:10 PM Carita Pian. Sanjuana Kava, [place] Pager [phone]

## 2014-07-25 NOTE — Progress Notes (Signed)
ANTICOAGULATION CONSULT NOTE - Initial Consult  Pharmacy Consult for Apixaban Indication: atrial fibrillation  No Known Allergies  Patient Measurements: Height: 5\' 9"  (175.3 cm) Weight: 172 lb 13.5 oz (78.4 kg) IBW/kg (Calculated) : 70.7  Vital Signs: Temp: 98.5 F (36.9 C) (03/06 0906) Temp Source: Oral (03/06 0906) BP: 95/57 mmHg (03/06 1000) Pulse Rate: 82 (03/06 1000)  Labs:  Recent Labs  07/23/14 0250 07/24/14 0255 07/25/14 0309  HGB  --   --  11.7*  HCT  --   --  35.3*  PLT  --   --  122*  CREATININE 1.79* 1.47* 1.65*    Estimated Creatinine Clearance: 33.9 mL/min (by C-G formula based on Cr of 1.65).   Medical History: Past Medical History  Diagnosis Date  . Coronary artery disease     a. s/p remote CABG x 2(VG->OM, LIMA->LAD;  b. Late 90's s/p PCI of RCA;  c. 12/09 Cath/PCI: LM 30m, 70-80d (3.0x57mm Xience DES), LAD100p, LCX 80-90p (2.25x64mmTaxus Atom DES), RCA 100d (2.5x26mm Xience DES), VG->OM 100, LIMA->LAD nl, EF 30-35%.  . Hyperlipidemia     hx of but states he has never been on meds  . Prostate cancer   . PAF (paroxysmal atrial fibrillation)     takes Amiodarone daily as well as Carvedilol  . Ischemic cardiomyopathy     takes Imdur daily  . Symptomatic bradycardia     a. 08/2003 s/p MDT Enpulse [id number] Dual chamber PPM ser # [id number] H.  . Atrial fibrillation   . Peripheral vascular disease   . CHF (congestive heart failure)     takes Torsemide daily  . Hard of hearing   . Hypertension     takes Imdur daily  . Shortness of breath dyspnea     with exertion occasionally  . Unsteady gait   . Dizziness   . History of gout   . History of gastric ulcer   . History of colon polyps   . Cataracts, bilateral   . Macular degeneration, dry   . History of shingles   . CKD (chronic kidney disease)     Medications:  Scheduled:  . amiodarone  200 mg Oral Daily  . apixaban  2.5 mg Oral BID  . carvedilol  6.25 mg Oral BID WC  . docusate sodium  100  mg Oral Daily  . isosorbide mononitrate  15 mg Oral Daily  . spironolactone  12.5 mg Oral Daily  . torsemide  20 mg Oral Once  . [START ON 07/26/2014] torsemide  40 mg Oral Daily  . vitamin E  400 Units Oral Daily    Assessment: 58 yoM admitted 07/19/14 for pacer infection with cardiogenic shock during explant on Apixaban PTA for Afib.  Apixaban was held on 3/3 for surgery and was held for 48 hours following the procedure.  Pharmacy consulted to restart Apixaban with plans to perform TEE-guided DCCV on Tuesday after 5 doses of Apixaban.  SCr 1.65, CrCl 33.9.  Will resume 2.5 mg BID based on renal function and age.   Goal of Therapy:  Monitor platelets by anticoagulation protocol: Yes   Plan:  -Start Apixaban 2.5 mg BID -Monitor renal function, signs of bleeding -Follow-up DCCV for Tuesday  Theron Arista, PharmD Clinical Pharmacist - Resident Pager: [phone] 3/6/201611:09 AM

## 2014-07-26 LAB — CULTURE, BLOOD (ROUTINE X 2)
CULTURE: NO GROWTH
Culture: NO GROWTH

## 2014-07-26 LAB — CBC
HCT: 34.5 % — ABNORMAL LOW (ref 39.0–52.0)
Hemoglobin: 11.4 g/dL — ABNORMAL LOW (ref 13.0–17.0)
MCH: 31.7 pg (ref 26.0–34.0)
MCHC: 33 g/dL (ref 30.0–36.0)
MCV: 95.8 fL (ref 78.0–100.0)
Platelets: 118 10*3/uL — ABNORMAL LOW (ref 150–400)
RBC: 3.6 MIL/uL — ABNORMAL LOW (ref 4.22–5.81)
RDW: 13.9 % (ref 11.5–15.5)
WBC: 4.7 10*3/uL (ref 4.0–10.5)

## 2014-07-26 LAB — BASIC METABOLIC PANEL
Anion gap: 6 (ref 5–15)
BUN: 33 mg/dL — ABNORMAL HIGH (ref 6–23)
CALCIUM: 8.3 mg/dL — AB (ref 8.4–10.5)
CO2: 27 mmol/L (ref 19–32)
CREATININE: 1.69 mg/dL — AB (ref 0.50–1.35)
Chloride: 104 mmol/L (ref 96–112)
GFR calc Af Amer: 41 mL/min — ABNORMAL LOW (ref >90)
GFR, EST NON AFRICAN AMERICAN: 36 mL/min — AB (ref >90)
GLUCOSE: 96 mg/dL (ref 70–99)
Potassium: 3.8 mmol/L (ref 3.5–5.1)
SODIUM: 137 mmol/L (ref 135–145)

## 2014-07-26 MED ORDER — SODIUM CHLORIDE 0.9 % IV SOLN
INTRAVENOUS | Status: DC
  Administered 2014-07-26: 22:00:00 via INTRAVENOUS

## 2014-07-26 MED ORDER — SODIUM CHLORIDE 0.9 % IJ SOLN
3.0000 mL | Freq: Two times a day (BID) | INTRAMUSCULAR | Status: DC
  Administered 2014-07-26 – 2014-07-27 (×3): 3 mL via INTRAVENOUS

## 2014-07-26 MED ORDER — SODIUM CHLORIDE 0.9 % IJ SOLN: 3.0000 mL | INTRAMUSCULAR | Status: DC | PRN

## 2014-07-26 MED ORDER — SODIUM CHLORIDE 0.9 % IV SOLN: 250.0000 mL | INTRAVENOUS | Status: DC

## 2014-07-26 NOTE — Progress Notes (Signed)
Patient ID: Marcus [name], male   DOB: 1928/10/12, 81 y.o.   MRN: [id number]   SUBJECTIVE:  St Jude CRT-P device implanted 3/4.  Walked yesterday without dyspnea.  Underlying atrial rhythm is flutter.   Echo showed EF 35-40%, no pericardial effusion, mild to moderate MR (no significant change from prior).    Blood cultures- NGTD  Scheduled Meds: . amiodarone  200 mg Oral Daily  . apixaban  2.5 mg Oral BID  . carvedilol  6.25 mg Oral BID WC  . docusate sodium  100 mg Oral Daily  . isosorbide mononitrate  15 mg Oral Daily  . spironolactone  12.5 mg Oral Daily  . torsemide  40 mg Oral Daily  . vitamin E  400 Units Oral Daily   Continuous Infusions: . sodium chloride 10 mL/hr at 07/24/14 0117   PRN Meds:.acetaminophen, acetaminophen, ondansetron (ZOFRAN) IV, ondansetron (ZOFRAN) IV   Filed Vitals:   07/25/14 1413 07/25/14 1728 07/25/14 2008 07/26/14 0443  BP: 106/65 101/59 104/66 110/66  Pulse: 82 82 80 80  Temp: 97.9 F (36.6 C)  98 F (36.7 C) 98.2 F (36.8 C)  TempSrc: Oral  Oral Oral  Resp: 18  18 18   Height:      Weight:      SpO2: 100%  99% 98%    Intake/Output Summary (Last 24 hours) at 07/26/14 0714 Last data filed at 07/26/14 0600  Gross per 24 hour  Intake    370 ml  Output   1500 ml  Net  -1130 ml    LABS: Basic Metabolic Panel:  Recent Labs  07/25/14 0309 07/26/14 0446  NA 139 137  K 4.2 3.8  CL 107 104  CO2 23 27  GLUCOSE 103* 96  BUN 29* 33*  CREATININE 1.65* 1.69*  CALCIUM 8.6 8.3*   Liver Function Tests: No results for input(s): AST, ALT, ALKPHOS, BILITOT, PROT, ALBUMIN in the last 72 hours. No results for input(s): LIPASE, AMYLASE in the last 72 hours. CBC:  Recent Labs  07/25/14 0309 07/26/14 0446  WBC 4.8 4.7  HGB 11.7* 11.4*  HCT 35.3* 34.5*  MCV 95.7 95.8  PLT 122* 118*   Cardiac Enzymes: No results for input(s): CKTOTAL, CKMB, CKMBINDEX, TROPONINI in the last 72 hours. BNP: Invalid input(s): POCBNP D-Dimer: No  results for input(s): DDIMER in the last 72 hours. Hemoglobin A1C: No results for input(s): HGBA1C in the last 72 hours. Fasting Lipid Panel: No results for input(s): CHOL, HDL, LDLCALC, TRIG, CHOLHDL, LDLDIRECT in the last 72 hours. Thyroid Function Tests: No results for input(s): TSH, T4TOTAL, T3FREE, THYROIDAB in the last 72 hours.  Invalid input(s): FREET3 Anemia Panel: No results for input(s): VITAMINB12, FOLATE, FERRITIN, TIBC, IRON, RETICCTPCT in the last 72 hours.  RADIOLOGY: Dg Chest 2 View  07/20/2014   CLINICAL DATA:  Status post pacemaker placement  EXAM: CHEST  2 VIEW  COMPARISON:  Portable chest x-ray of July 19, 2014 and March 24, 2014.  FINDINGS: The lungs are well-expanded. There is no right-sided pneumothorax or hemothorax. There is chronic scarring of the pleura at the left lung base laterally and there is chronic blunting of the right lateral costophrenic angle. The cardiac silhouette is mildly enlarged. The pulmonary vascularity is normal. The permanent pacemaker electrodes are in reasonable position radiographically. The left internal jugular venous catheter tip projects over the proximal SVC. There are 7 intact sternal wires from previous median sternotomy. The bony thorax exhibits no acute abnormality.  IMPRESSION: There is no  postprocedure complication following permanent pacemaker placement. The support tubes and lines are in appropriate position.   Electronically Signed   By: David  Martinique   On: 07/20/2014 07:35   Dg Chest Port 1 View  07/23/2014   CLINICAL DATA:  Status post pacemaker placement  EXAM: PORTABLE CHEST - 1 VIEW  COMPARISON:  07/20/2014  FINDINGS: The previously seen right-sided pacing device has been removed and a new 3 lead pacing device placed in satisfactory position. No pneumothorax is noted. Mild chronic interstitial changes are noted. No focal infiltrate is seen. The left jugular central line has been removed.  IMPRESSION: No pneumothorax following  pacer placement   Electronically Signed   By: Inez Catalina M.D.   On: 07/23/2014 16:09   Dg Chest Port 1 View  07/19/2014   CLINICAL DATA:  Pacemaker lead removal/extraction, central line placement  EXAM: PORTABLE CHEST - 1 VIEW  COMPARISON:  03/24/2014  FINDINGS: Right-sided pacer in place with single lead terminating over the expected location of the right ventricle. Left IJ central venous catheter tip terminates at the brachiocephalic/SVC junction. Evidence of median sternotomy. Heart size at upper limits of normal. Lungs are hypoaerated with crowding of the bronchovascular markings and curvilinear bibasilar atelectasis. Trace bilateral pleural effusions are present. No acute osseous abnormality. No pneumothorax.  IMPRESSION: Low volumes with probable bibasilar atelectasis. Consider PA and lateral chest radiographs obtained at full inspiration when the patient is clinically able.  Left IJ central line tip terminating at the brachiocephalic/ SVC junction.   Electronically Signed   By: Conchita Paris M.D.   On: 07/19/2014 14:51    PHYSICAL EXAM General: NAD Neck: JVP 8 cm, no thyromegaly or thyroid nodule.  Lungs: Decreased breath sounds at bases bilaterally.  CV: Nondisplaced PMI.  Heart regular S1/S2, no S3/S4, no murmur.  No edema.   Abdomen: Soft, nontender, no hepatosplenomegaly, no distention.  Neurologic: Alert and oriented x 3.  Psych: Normal affect. Extremities: No clubbing or cyanosis. Bilateral SCDs.  PCM pocket site benign.   TELEMETRY: Reviewed telemetry pt in v-paced, suspect underlying NSR.   ASSESSMENT AND PLAN: 81 yo with history of pacemaker for bradycardia, paroxysmal atrial fibrillation, CAD s/p CABG and PCIs, and ischemic cardiomyopathy EF 20-25%. Underwent CRT-D upgrade 10/08/13 due to 85% RV pacing.EF improved to 35-40%.  Now s/p explantation of CRT-D device and placement of temp-perm device due to suspected pocket infection. He had CRT-P device placed on 3/4.  1. CRT-D  device pocket infection: s/p explantation. Had temporary-permanent device, now with St Jude CRT-P re-implanted. Afebrile, cultures NGTD, WBCs normal. 2. Chronic systolic CHF: Ischemic cardiomyopathy, EF 35-40% on echo this admission.  This is stable compared to prior.  He was hypotensive post-explantation of CRT-D.  He lost capture after temp-perm placement with brief CPR and initially required pressors.  No pericardial effusion. Now off pressors. Now with re-implanted St Jude CRT-P device.   - Continue to mobilize. Cardiac rehab/PT.  - Continue Coreg and spironolactone at current doses.   - I increased torsemide yesterday, will continue higher dose for a couple days with mild volume overload, likely home on 20 mg daily.  3. Atrial flutter: Current atrial rhythm is flutter.  Continue amiodarone.  Restarted Eliquis Sunday.  On Tuesday, he can undergo TEE-guided DCCV after 5 doses Eliquis => will arrange.   4. CKD: Stable with some fluctuation.  Continue to follow.  5. Disposition: Probably home after DCCV tomorrow.   [name]  07/26/2014 7:14 AM

## 2014-07-26 NOTE — Progress Notes (Signed)
Patient ambulated 450 feet unassisted.  Patient tolerated well.  Call bell within reach.  RN will continue to monitor

## 2014-07-26 NOTE — Progress Notes (Signed)
CARDIAC REHAB PHASE I   PRE:  Rate/Rhythm: paced 84  BP:  Supine: 103/57  Sitting:   Standing:    SaO2:   MODE:  Ambulation: 890 ft   POST:  Rate/Rhythm: paced 85  BP:  Supine:   Sitting: 126/61  Standing:    SaO2: 100%RA [id number] Pt walked 890 ft with hand held asst with steady gait. Tolerated well. To recliner with call bell.   Graylon Good, RN BSN  07/26/2014 9:04 AM

## 2014-07-27 ENCOUNTER — Inpatient Hospital Stay (HOSPITAL_COMMUNITY): Payer: Medicare Other | Admitting: Anesthesiology

## 2014-07-27 ENCOUNTER — Encounter (HOSPITAL_COMMUNITY): Payer: Self-pay | Admitting: Anesthesiology

## 2014-07-27 ENCOUNTER — Encounter (HOSPITAL_COMMUNITY)
Admission: RE | Disposition: A | Discharge status: Home / Self Care | Payer: Self-pay | Source: Ambulatory Visit | Attending: Internal Medicine

## 2014-07-27 DIAGNOSIS — I4892 Unspecified atrial flutter: Secondary | ICD-10-CM

## 2014-07-27 DIAGNOSIS — N189 Chronic kidney disease, unspecified: Secondary | ICD-10-CM

## 2014-07-27 DIAGNOSIS — I340 Nonrheumatic mitral (valve) insufficiency: Secondary | ICD-10-CM

## 2014-07-27 HISTORY — PX: TEE WITHOUT CARDIOVERSION: SHX5443

## 2014-07-27 HISTORY — PX: CARDIOVERSION: SHX1299

## 2014-07-27 LAB — BASIC METABOLIC PANEL
Anion gap: 7 (ref 5–15)
BUN: 41 mg/dL — ABNORMAL HIGH (ref 6–23)
CALCIUM: 8.6 mg/dL (ref 8.4–10.5)
CO2: 27 mmol/L (ref 19–32)
Chloride: 105 mmol/L (ref 96–112)
Creatinine, Ser: 1.87 mg/dL — ABNORMAL HIGH (ref 0.50–1.35)
GFR calc Af Amer: 37 mL/min — ABNORMAL LOW (ref >90)
GFR calc non Af Amer: 32 mL/min — ABNORMAL LOW (ref >90)
Glucose, Bld: 92 mg/dL (ref 70–99)
Potassium: 4.1 mmol/L (ref 3.5–5.1)
Sodium: 139 mmol/L (ref 135–145)

## 2014-07-27 LAB — CBC
HCT: 34.4 % — ABNORMAL LOW (ref 39.0–52.0)
Hemoglobin: 11.6 g/dL — ABNORMAL LOW (ref 13.0–17.0)
MCH: 32 pg (ref 26.0–34.0)
MCHC: 33.7 g/dL (ref 30.0–36.0)
MCV: 95 fL (ref 78.0–100.0)
Platelets: 135 10*3/uL — ABNORMAL LOW (ref 150–400)
RBC: 3.62 MIL/uL — ABNORMAL LOW (ref 4.22–5.81)
RDW: 13.9 % (ref 11.5–15.5)
WBC: 4.6 10*3/uL (ref 4.0–10.5)

## 2014-07-27 SURGERY — ECHOCARDIOGRAM, TRANSESOPHAGEAL
Anesthesia: Monitor Anesthesia Care

## 2014-07-27 MED ORDER — BUTAMBEN-TETRACAINE-BENZOCAINE 2-2-14 % EX AERO
INHALATION_SPRAY | CUTANEOUS | Status: DC | PRN
  Administered 2014-07-27: 2 via TOPICAL

## 2014-07-27 MED ORDER — CARVEDILOL 6.25 MG PO TABS: 6.2500 mg | ORAL_TABLET | Freq: Two times a day (BID) | ORAL | Status: DC

## 2014-07-27 MED ORDER — HYDROMORPHONE HCL 1 MG/ML IJ SOLN: 0.2500 mg | INTRAMUSCULAR | Status: DC | PRN

## 2014-07-27 MED ORDER — PROPOFOL 10 MG/ML IV BOLUS
INTRAVENOUS | Status: DC | PRN
  Administered 2014-07-27: 10 mg via INTRAVENOUS
  Administered 2014-07-27: 20 mg via INTRAVENOUS
  Administered 2014-07-27 (×2): 10 mg via INTRAVENOUS

## 2014-07-27 MED ORDER — SODIUM CHLORIDE 0.9 % IV SOLN
INTRAVENOUS | Status: DC | PRN
  Administered 2014-07-27: 14:00:00 via INTRAVENOUS

## 2014-07-27 MED ORDER — PHENYLEPHRINE HCL 10 MG/ML IJ SOLN
INTRAMUSCULAR | Status: DC | PRN
  Administered 2014-07-27: 40 ug via INTRAVENOUS

## 2014-07-27 MED ORDER — TORSEMIDE 20 MG PO TABS
20.0000 mg | ORAL_TABLET | Freq: Every day | ORAL | Status: DC
  Administered 2014-07-27: 20 mg via ORAL
  Filled 2014-07-27: qty 1

## 2014-07-27 MED ORDER — AMIODARONE HCL 200 MG PO TABS
200.0000 mg | ORAL_TABLET | Freq: Every day | ORAL | Status: DC
Start: 2014-07-27 — End: 2014-08-10

## 2014-07-27 MED ORDER — ONDANSETRON HCL 4 MG/2ML IJ SOLN: 4.0000 mg | Freq: Once | INTRAMUSCULAR | Status: DC | PRN

## 2014-07-27 MED ORDER — PROPOFOL INFUSION 10 MG/ML OPTIME
INTRAVENOUS | Status: DC | PRN
  Administered 2014-07-27: 35 ug/kg/min via INTRAVENOUS

## 2014-07-27 MED ORDER — TORSEMIDE 20 MG PO TABS: 20.0000 mg | ORAL_TABLET | Freq: Every day | ORAL | Status: DC

## 2014-07-27 NOTE — Discharge Summary (Signed)
Discharge Summary   Patient ID: Marcus [name] MRN: [id number], DOB/AGE: 1928/07/31 81 y.o. Admit date: 07/19/2014 D/C date:     07/27/2014  Primary Care Provider: No PCP Per Patient Primary Cardiologist: [facility]  Primary Discharge Diagnoses:  1. CRT device pocket infection  - s/p device explant 07/19/14, s/p temp perm complicated by profound hypotension with loss of capture - s/p PPM 07/23/14 2. H/o complete heart block with no escape s/p pacemaker 3. Acute on chronic systolic CHF/ICM EF [id number] (stable from prior) 4. Atrial flutter s/p TEE/DCCV 07/27/14 5. CKD stage III  Other pertinent PMH:  Past Medical History  Diagnosis Date  . Coronary artery disease     a. s/p remote CABG x 2(VG->OM, LIMA->LAD;  b. Late 90's s/p PCI of RCA;  c. 12/09 Cath/PCI: LM 82m, 70-80d (3.0x57mm Xience DES), LAD100p, LCX 80-90p (2.25x11mmTaxus Atom DES), RCA 100d (2.5x66mm Xience DES), VG->OM 100, LIMA->LAD nl, EF 30-35%.  . Hyperlipidemia   . Prostate cancer   . PAF (paroxysmal atrial fibrillation)   . Ischemic cardiomyopathy     a. EF 35-40% by echo 05/2012, b. EF 20-25%, akinesis and scarring of inferolateral, inferior and inferoseptal myocardium, mild AI, mod MR, LA mod dilated, RV mildly dialted and sys fx mildly reduced, RA mildly dilated (09/2013)  c. EF 35-40% by 2D in 07/2014, 25-30% by TEE.  . Symptomatic bradycardia     a. 08/2003 s/p MDT Enpulse [id number] Dual chamber PPM ser # [id number] H. b. Device explant due to infection/temp perm insertion/PPM 07/2014.  Marland Kitchen Peripheral vascular disease   . Chronic systolic CHF (congestive heart failure)     a. s/p CRT-D 09/2013. b. Device explant with temp perm then subsequent CRT-P 07/2014.  Marland Kitchen Hard of hearing   . Hypertension   . History of gout   . History of gastric ulcer   . History of colon polyps   . Cataracts, bilateral   . Macular degeneration, dry   . History of shingles   . CKD (chronic kidney disease), stage III   . Atrial flutter     a. Dx  07/2014, s/p TEE/DCCV.  Marland Kitchen Pacemaker infection     Hospital Course: Marcus [name] is an 81 y/o M with history of CAD as above, ICM/chronic systolic CHF, HTN, PAF, CKD, CHB s/p pacer 2004 with changeout in 2014 and worsening heart failure with subsequent BiV upgrade. His last procedure was complicated by the development of a hematoma which resolved. At office visit on 07/07/14 his pacer pocket was noted to have opened, with exudate and concern for infected pacing system. He was not systemically ill at the time. Dr. Lovena Le discussed treatment options and ultimately the patient was recommended to have device removal and insertion of a new device.   He was brought in on 07/19/14 for this. He underwent removal of prior system and insertion of temporary permanent transvenous pacemaker. After his procedure, he had loss of capture of the temporary permanent PM with escape rhythm of 20bpm. CPR with chest compression was started for approximately 20-30 seconds. He was given 100 mcg of epinephrine with prompt BP and HR response. Transcutaneous pacing was commenced and after disconnecting the pm from generator, the temp perm lead was working normally. Reconnecting the lead and interogation demonstrated normal pacing.  The blood pressure however remained very low ([id number] systolic) and a left IJ catheter was placed and neosynephrine and epinephrine were started. Bedside echo performed with no evidence pericardial effusion. CXR performed and showed probable atelectasis. Due  to hypotension a central line was placed and he was started on epinephrine and neosynephrine.Dr. Aundra Dubin evaluated the patient and felt dyssynchronous RV pacing + sedation may have played a role in this loss of BiV pacing. He recommended to hold antihypertensives. The patient was was treated with Ancef. Blood cx remained negative. Pressors were gradually weaned off by the following day. 2D Echo 07/20/14 - EF 35-40% (similar to prior), no pericardial effusion, no  significant change from prior. The patient ultimately underwent explantation of the temp-perm device and insertion of a St. Jude dual chamber pacemaker with LV lead placement on 07/23/14. Torsemide was increased temporarily. Underlying rhythm noted to be atrial flutter. After 5 doses of Eliquis, the patient underwent TEE with successful DCCV to NSR today. TEE report: "Mildly dilated LV with severe systolic dysfunction, EF [id number]. Diffuse hypokinesis, anterior wall appears most preserved. Mildly dilated RV with mildly decreased systolic function. Mild biatrial enlargement. No LAA thrombus. Mild to moderate MR. Trivial AI, no AS. Trileaflet aortic valve. Negative bubble study so no evidence for ASD or PFO. Normal caliber thoracic aorta with grade III plaque descending thoracic aorta." Patient tolerated this well. Torsemide decreased back to home dose at discharge due to creatinine creeping up (1.87). Dr. Aundra Dubin has seen and examined the patient today and feels he is stable for discharge.  Due to late nature of the discharge office is no longer open to make his appointments. It looks like he has a wound check scheduled for tomorrow but this is far too soon. I have sent a message to our scheduling pool requesting a follow-up wound check in 7-10 days as well as pacer check in 3 months with Dr. Lovena Le, and our office will call the patient with this information. The patient has followup in the CHF clinic on 08/10/14. Dr. Aundra Dubin also recommends a BMET in 1 week and I sent a message to his schedulers to try to arrange.  Discharge Vitals: Blood pressure 112/68, pulse 70, temperature 97.8 F (36.6 C), temperature source Oral, resp. rate 18, height 5\' 9"  (1.753 m), weight 171 lb 1.2 oz (77.6 kg), SpO2 100 %.  Labs: Lab Results  Component Value Date   WBC 4.6 07/27/2014   HGB 11.6* 07/27/2014   HCT 34.4* 07/27/2014   MCV 95.0 07/27/2014   PLT 135* 07/27/2014    Recent Labs Lab 07/27/14 0516  NA 139  K  4.1  CL 105  CO2 27  BUN 41*  CREATININE 1.87*  CALCIUM 8.6  GLUCOSE 92    Lab Results  Component Value Date   CHOL 160 02/02/2013   HDL 72.20 02/02/2013   LDLCALC 77 02/02/2013   TRIG 56.0 02/02/2013     Diagnostic Studies/Procedures   Summarized above. See Epic for op reports, 2D echo and TEE report.  Dg Chest 2 View  07/20/2014   CLINICAL DATA:  Status post pacemaker placement  EXAM: CHEST  2 VIEW  COMPARISON:  Portable chest x-ray of July 19, 2014 and March 24, 2014.  FINDINGS: The lungs are well-expanded. There is no right-sided pneumothorax or hemothorax. There is chronic scarring of the pleura at the left lung base laterally and there is chronic blunting of the right lateral costophrenic angle. The cardiac silhouette is mildly enlarged. The pulmonary vascularity is normal. The permanent pacemaker electrodes are in reasonable position radiographically. The left internal jugular venous catheter tip projects over the proximal SVC. There are 7 intact sternal wires from previous median sternotomy. The bony thorax exhibits no  acute abnormality.  IMPRESSION: There is no postprocedure complication following permanent pacemaker placement. The support tubes and lines are in appropriate position.   Electronically Signed   By: David  Martinique   On: 07/20/2014 07:35   Dg Chest Port 1 View  07/23/2014   CLINICAL DATA:  Status post pacemaker placement  EXAM: PORTABLE CHEST - 1 VIEW  COMPARISON:  07/20/2014  FINDINGS: The previously seen right-sided pacing device has been removed and a new 3 lead pacing device placed in satisfactory position. No pneumothorax is noted. Mild chronic interstitial changes are noted. No focal infiltrate is seen. The left jugular central line has been removed.  IMPRESSION: No pneumothorax following pacer placement   Electronically Signed   By: Inez Catalina M.D.   On: 07/23/2014 16:09   Dg Chest Port 1 View  07/19/2014   CLINICAL DATA:  Pacemaker lead  removal/extraction, central line placement  EXAM: PORTABLE CHEST - 1 VIEW  COMPARISON:  03/24/2014  FINDINGS: Right-sided pacer in place with single lead terminating over the expected location of the right ventricle. Left IJ central venous catheter tip terminates at the brachiocephalic/SVC junction. Evidence of median sternotomy. Heart size at upper limits of normal. Lungs are hypoaerated with crowding of the bronchovascular markings and curvilinear bibasilar atelectasis. Trace bilateral pleural effusions are present. No acute osseous abnormality. No pneumothorax.  IMPRESSION: Low volumes with probable bibasilar atelectasis. Consider PA and lateral chest radiographs obtained at full inspiration when the patient is clinically able.  Left IJ central line tip terminating at the brachiocephalic/ SVC junction.   Electronically Signed   By: Conchita Paris M.D.   On: 07/19/2014 14:51    Discharge Medications   Current Discharge Medication List    CONTINUE these medications which have CHANGED   Details  amiodarone (PACERONE) 200 MG tablet Take 1 tablet (200 mg total) by mouth daily. Qty: 30 tablet, Refills: 1   Associated Diagnoses: Acute on chronic systolic heart failure; Paroxysmal atrial fibrillation    carvedilol (COREG) 6.25 MG tablet Take 1 tablet (6.25 mg total) by mouth 2 (two) times daily with a meal. Qty: 60 tablet, Refills: 1   Associated Diagnoses: Acute on chronic systolic heart failure    torsemide (DEMADEX) 20 MG tablet Take 1 tablet (20 mg total) by mouth daily.      CONTINUE these medications which have NOT CHANGED   Details  apixaban (ELIQUIS) 2.5 MG TABS tablet Take 1 tablet (2.5 mg total) by mouth 2 (two) times daily.     Ascorbic Acid (VITAMIN C) 1000 MG tablet Take 1,000 mg by mouth daily.    Coenzyme Q10 (CO Q 10) 100 MG CAPS Take 1 capsule by mouth 2 (two) times daily.    isosorbide mononitrate (IMDUR) 30 MG 24 hr tablet Take 0.5 tablets (15 mg total) by mouth daily.       Multiple Vitamins-Minerals (PRESERVISION AREDS 2) CAPS Take 1 capsule by mouth 2 (two) times daily.     spironolactone (ALDACTONE) 25 MG tablet TAKE ONE-HALF TABLET BY MOUTH ONCE DAILY Qty: 45 tablet, Refills: 3    vitamin E 400 UNIT capsule Take 400 Units by mouth daily.    CALCIUM PO Take 1 tablet by mouth daily.     Cholecalciferol (VITAMIN D PO) Take 1 capsule by mouth daily.       STOP taking these medications     Cholecalciferol (VITAMIN D3) [id number] UNITS CAPS (duplicate)      Omega-3 Fatty Acids (OMEGA-3 FISH OIL PO)  Disposition   The patient will be discharged in stable condition to home.  Follow-up Information    Follow up with [name], MD On 08/10/2014.   Specialty:  Cardiology   Why:  at 2:20  [place] information:   [address]  [place] [address] [phone]       Follow up with [name], MD.   Specialty:  Cardiology   Why:  Office will call you to schedule labwork (BMET) in 1 week.   Contact information:   [place] [address] [phone]       Follow up with [facility].   Specialty:  Cardiology   Why:  Office will call you for your wound check and followup appointment. Call office if you have not heard back in 3 days.   Contact information:   [address], [place] [phone]        Duration of Discharge Encounter: Greater than 30 minutes including physician and PA time.  Signed, Melina Copa PA-C 07/27/2014, 7:15 PM

## 2014-07-27 NOTE — Progress Notes (Signed)
Patient ID: Marcus [name], male   DOB: Aug 19, 1928, 81 y.o.   MRN: [id number]   SUBJECTIVE:  St Jude CRT-P device implanted 3/4.  Stable clinically, weight down.  Underlying atrial rhythm is flutter.   Echo showed EF 35-40%, no pericardial effusion, mild to moderate MR (no significant change from prior).    Blood cultures- NGTD  Scheduled Meds: . amiodarone  200 mg Oral Daily  . apixaban  2.5 mg Oral BID  . carvedilol  6.25 mg Oral BID WC  . docusate sodium  100 mg Oral Daily  . isosorbide mononitrate  15 mg Oral Daily  . sodium chloride  3 mL Intravenous Q12H  . spironolactone  12.5 mg Oral Daily  . torsemide  20 mg Oral Daily  . vitamin E  400 Units Oral Daily   Continuous Infusions: . sodium chloride 10 mL/hr at 07/24/14 0117  . sodium chloride 20 mL/hr at 07/26/14 2154  . sodium chloride     PRN Meds:.acetaminophen, acetaminophen, ondansetron (ZOFRAN) IV, ondansetron (ZOFRAN) IV, sodium chloride   Filed Vitals:   07/26/14 1435 07/26/14 1851 07/26/14 2020 07/27/14 0418  BP: 99/59 100/53 111/56 97/62  Pulse: 77  82 80  Temp: 97.6 F (36.4 C)  98.1 F (36.7 C) 97.8 F (36.6 C)  TempSrc: Oral  Oral Oral  Resp: 19  17 18   Height:      Weight:    171 lb 1.2 oz (77.6 kg)  SpO2: 100%  100% 97%    Intake/Output Summary (Last 24 hours) at 07/27/14 1309 Last data filed at 07/27/14 1017  Gross per 24 hour  Intake    882 ml  Output   1375 ml  Net   -493 ml    LABS: Basic Metabolic Panel:  Recent Labs  07/26/14 0446 07/27/14 0516  NA 137 139  K 3.8 4.1  CL 104 105  CO2 27 27  GLUCOSE 96 92  BUN 33* 41*  CREATININE 1.69* 1.87*  CALCIUM 8.3* 8.6   Liver Function Tests: No results for input(s): AST, ALT, ALKPHOS, BILITOT, PROT, ALBUMIN in the last 72 hours. No results for input(s): LIPASE, AMYLASE in the last 72 hours. CBC:  Recent Labs  07/26/14 0446 07/27/14 0516  WBC 4.7 4.6  HGB 11.4* 11.6*  HCT 34.5* 34.4*  MCV 95.8 95.0  PLT 118* 135*    Cardiac Enzymes: No results for input(s): CKTOTAL, CKMB, CKMBINDEX, TROPONINI in the last 72 hours. BNP: Invalid input(s): POCBNP D-Dimer: No results for input(s): DDIMER in the last 72 hours. Hemoglobin A1C: No results for input(s): HGBA1C in the last 72 hours. Fasting Lipid Panel: No results for input(s): CHOL, HDL, LDLCALC, TRIG, CHOLHDL, LDLDIRECT in the last 72 hours. Thyroid Function Tests: No results for input(s): TSH, T4TOTAL, T3FREE, THYROIDAB in the last 72 hours.  Invalid input(s): FREET3 Anemia Panel: No results for input(s): VITAMINB12, FOLATE, FERRITIN, TIBC, IRON, RETICCTPCT in the last 72 hours.  RADIOLOGY: Dg Chest 2 View  07/20/2014   CLINICAL DATA:  Status post pacemaker placement  EXAM: CHEST  2 VIEW  COMPARISON:  Portable chest x-ray of July 19, 2014 and March 24, 2014.  FINDINGS: The lungs are well-expanded. There is no right-sided pneumothorax or hemothorax. There is chronic scarring of the pleura at the left lung base laterally and there is chronic blunting of the right lateral costophrenic angle. The cardiac silhouette is mildly enlarged. The pulmonary vascularity is normal. The permanent pacemaker electrodes are in reasonable position radiographically. The left  internal jugular venous catheter tip projects over the proximal SVC. There are 7 intact sternal wires from previous median sternotomy. The bony thorax exhibits no acute abnormality.  IMPRESSION: There is no postprocedure complication following permanent pacemaker placement. The support tubes and lines are in appropriate position.   Electronically Signed   By: David  Martinique   On: 07/20/2014 07:35   Dg Chest Port 1 View  07/23/2014   CLINICAL DATA:  Status post pacemaker placement  EXAM: PORTABLE CHEST - 1 VIEW  COMPARISON:  07/20/2014  FINDINGS: The previously seen right-sided pacing device has been removed and a new 3 lead pacing device placed in satisfactory position. No pneumothorax is noted. Mild  chronic interstitial changes are noted. No focal infiltrate is seen. The left jugular central line has been removed.  IMPRESSION: No pneumothorax following pacer placement   Electronically Signed   By: Inez Catalina M.D.   On: 07/23/2014 16:09   Dg Chest Port 1 View  07/19/2014   CLINICAL DATA:  Pacemaker lead removal/extraction, central line placement  EXAM: PORTABLE CHEST - 1 VIEW  COMPARISON:  03/24/2014  FINDINGS: Right-sided pacer in place with single lead terminating over the expected location of the right ventricle. Left IJ central venous catheter tip terminates at the brachiocephalic/SVC junction. Evidence of median sternotomy. Heart size at upper limits of normal. Lungs are hypoaerated with crowding of the bronchovascular markings and curvilinear bibasilar atelectasis. Trace bilateral pleural effusions are present. No acute osseous abnormality. No pneumothorax.  IMPRESSION: Low volumes with probable bibasilar atelectasis. Consider PA and lateral chest radiographs obtained at full inspiration when the patient is clinically able.  Left IJ central line tip terminating at the brachiocephalic/ SVC junction.   Electronically Signed   By: Conchita Paris M.D.   On: 07/19/2014 14:51    PHYSICAL EXAM General: NAD Neck: JVP 7 cm, no thyromegaly or thyroid nodule.  Lungs: Decreased breath sounds at bases bilaterally.  CV: Nondisplaced PMI.  Heart regular S1/S2, no S3/S4, no murmur.  No edema.   Abdomen: Soft, nontender, no hepatosplenomegaly, no distention.  Neurologic: Alert and oriented x 3.  Psych: Normal affect. Extremities: No clubbing or cyanosis. Bilateral SCDs.  PCM pocket site benign.   TELEMETRY: Reviewed telemetry pt in v-paced, suspect underlying atrial flutter.   ASSESSMENT AND PLAN: 81 yo with history of pacemaker for bradycardia, paroxysmal atrial fibrillation, CAD s/p CABG and PCIs, and ischemic cardiomyopathy EF 20-25%. Underwent CRT-D upgrade 10/08/13 due to 85% RV pacing.EF  improved to 35-40%.  Now s/p explantation of CRT-D device and placement of temp-perm device due to suspected pocket infection. He had CRT-P device placed on 3/4.  1. CRT-D device pocket infection: s/p explantation. Had temporary-permanent device, now with St Jude CRT-P re-implanted. Afebrile, cultures no growth, WBCs normal. 2. Chronic systolic CHF: Ischemic cardiomyopathy, EF 35-40% on echo this admission.  This is stable compared to prior.  He was hypotensive post-explantation of CRT-D.  He lost capture after temp-perm placement with brief CPR and initially required pressors.  No pericardial effusion. Now off pressors. Now with re-implanted St Jude CRT-P device.   - Continue to mobilize. Cardiac rehab/PT.  - Continue Coreg and spironolactone at current doses.   - Creatinine up today, decrease torsemide back to home dose 20 mg daily.  3. Atrial flutter: Current atrial rhythm is flutter.  Continue amiodarone.  Restarted Eliquis Sunday.  Today he will undergo TEE-guided DCCV after 5th dose of Eliquis.   4. CKD: Creatinine up mildly today, decrease  torsemide to 20 mg daily.   5. Disposition: He should be able to go home after DCCV.  Home meds: amiodarone 200 mg daily, apixaban 2.5 mg bid, Coreg 6.25 mg bid, Imdur 15 daily, spironolactone 12.5 mg daily.  Needs followup 1-2 weeks CHF clinic.  Followup with Dr Lovena Le for device.  BMET 1 week.   [name]  07/27/2014 1:09 PM

## 2014-07-27 NOTE — Anesthesia Preprocedure Evaluation (Signed)
Anesthesia Evaluation  Patient identified by MRN, date of birth, ID band Patient awake    Reviewed: Allergy & Precautions, NPO status , Patient's Chart, lab work & pertinent test results  Airway        Dental   Pulmonary former smoker,          Cardiovascular hypertension, + CAD, + Peripheral Vascular Disease and +CHF + dysrhythmias Atrial Fibrillation + pacemaker     Neuro/Psych    GI/Hepatic   Endo/Other    Renal/GU Renal InsufficiencyRenal disease     Musculoskeletal   Abdominal   Peds  Hematology  (+) anemia ,   Anesthesia Other Findings   Reproductive/Obstetrics                             Anesthesia Physical Anesthesia Plan  ASA: IV  Anesthesia Plan: MAC   Post-op Pain Management:    Induction: Intravenous  Airway Management Planned: Mask  Additional Equipment:   Intra-op Plan:   Post-operative Plan:   Informed Consent: I have reviewed the patients History and Physical, chart, labs and discussed the procedure including the risks, benefits and alternatives for the proposed anesthesia with the patient or authorized representative who has indicated his/her understanding and acceptance.     Plan Discussed with: CRNA, Anesthesiologist and Surgeon  Anesthesia Plan Comments:         Anesthesia Quick Evaluation

## 2014-07-27 NOTE — Interval H&P Note (Signed)
History and Physical Interval Note:  07/27/2014 2:12 PM  Marcus [name]  has presented today for surgery, with the diagnosis of atrial flutter  The various methods of treatment have been discussed with the patient and family. After consideration of risks, benefits and other options for treatment, the patient has consented to  Procedure(s): TRANSESOPHAGEAL ECHOCARDIOGRAM (TEE) ([name]) CARDIOVERSION ([name]) as a surgical intervention .  The patient's history has been reviewed, patient examined, no change in status, stable for surgery.  I have reviewed the patient's chart and labs.  Questions were answered to the patient's satisfaction.     [name]

## 2014-07-27 NOTE — CV Procedure (Signed)
Procedure:  TEE  Indication: Atrial flutter, pre-DCCV  Sedation: Propofol per anesthesiology  Findings: Please see echo section for full report.  Mildly dilated LV with severe systolic dysfunction, EF [id number].  Diffuse hypokinesis, anterior wall appears most preserved.  Mildly dilated RV with mildly decreased systolic function.  Mild biatrial enlargement.  No LAA thrombus. Mild to moderate MR.  Trivial AI, no AS.  Trileaflet aortic valve.  Negative bubble study so no evidence for ASD or PFO.  Normal caliber thoracic aorta with grade III plaque descending thoracic aorta.   Patient may proceed to DCCV.   Marcus [name] 07/27/2014 2:30 PM

## 2014-07-27 NOTE — Procedures (Addendum)
Electrical Cardioversion Procedure Note Marcus [name] [id number] 05/06/1929  Procedure: Electrical Cardioversion Indications:  Atrial Flutter.  He has had 5 doses of Eliquis and TEE showed no LAA thrombus.   Procedure Details Consent: Risks of procedure as well as the alternatives and risks of each were explained to the (patient/caregiver).  Consent for procedure obtained. Time Out: Verified patient identification, verified procedure, site/side was marked, verified correct patient position, special equipment/implants available, medications/allergies/relevent history reviewed, required imaging and test results available.  Performed  Patient placed on cardiac monitor, pulse oximetry, supplemental oxygen as necessary.  Sedation given: Propofol Pacer pads placed anterior and posterior chest.  Cardioverted 1 time(s).  Cardioverted at 150J.  Evaluation Findings: Post procedure EKG shows: A-paced, BiV paced Complications: None Patient did tolerate procedure well.  St Jude device interrogated after procedure, function was normal.   [name] 07/27/2014, 2:30 PM

## 2014-07-27 NOTE — Progress Notes (Signed)
CARDIAC REHAB PHASE I   PRE:  Rate/Rhythm:   BP:  Supine:   Sitting: 102/53  Standing:    SaO2:   MODE:  Ambulation: 990 ft   POST:  Rate/Rhythm: 84 paced  BP:  Supine:   Sitting: 110/54  Standing:    SaO2:  0936-1010 Pt walked 990 ft with asst x 1 with steady gait. Tolerated well.  Gave pt OFF THE BEAT booklet as wife did not think they had one.  Pt stated has been dealing with irregular rhythm for a long time.   Graylon Good, RN BSN  07/27/2014 10:07 AM   282 paced

## 2014-07-27 NOTE — H&P (View-Only) (Signed)
Patient ID: Marcus [name], male   DOB: 1928/11/20, 81 y.o.   MRN: [id number]   SUBJECTIVE:  St Jude CRT-P device implanted 3/4.  Stable clinically, weight down.  Underlying atrial rhythm is flutter.   Echo showed EF 35-40%, no pericardial effusion, mild to moderate MR (no significant change from prior).    Blood cultures- NGTD  Scheduled Meds: . amiodarone  200 mg Oral Daily  . apixaban  2.5 mg Oral BID  . carvedilol  6.25 mg Oral BID WC  . docusate sodium  100 mg Oral Daily  . isosorbide mononitrate  15 mg Oral Daily  . sodium chloride  3 mL Intravenous Q12H  . spironolactone  12.5 mg Oral Daily  . torsemide  20 mg Oral Daily  . vitamin E  400 Units Oral Daily   Continuous Infusions: . sodium chloride 10 mL/hr at 07/24/14 0117  . sodium chloride 20 mL/hr at 07/26/14 2154  . sodium chloride     PRN Meds:.acetaminophen, acetaminophen, ondansetron (ZOFRAN) IV, ondansetron (ZOFRAN) IV, sodium chloride   Filed Vitals:   07/26/14 1435 07/26/14 1851 07/26/14 2020 07/27/14 0418  BP: 99/59 100/53 111/56 97/62  Pulse: 77  82 80  Temp: 97.6 F (36.4 C)  98.1 F (36.7 C) 97.8 F (36.6 C)  TempSrc: Oral  Oral Oral  Resp: 19  17 18   Height:      Weight:    171 lb 1.2 oz (77.6 kg)  SpO2: 100%  100% 97%    Intake/Output Summary (Last 24 hours) at 07/27/14 1309 Last data filed at 07/27/14 1017  Gross per 24 hour  Intake    882 ml  Output   1375 ml  Net   -493 ml    LABS: Basic Metabolic Panel:  Recent Labs  07/26/14 0446 07/27/14 0516  NA 137 139  K 3.8 4.1  CL 104 105  CO2 27 27  GLUCOSE 96 92  BUN 33* 41*  CREATININE 1.69* 1.87*  CALCIUM 8.3* 8.6   Liver Function Tests: No results for input(s): AST, ALT, ALKPHOS, BILITOT, PROT, ALBUMIN in the last 72 hours. No results for input(s): LIPASE, AMYLASE in the last 72 hours. CBC:  Recent Labs  07/26/14 0446 07/27/14 0516  WBC 4.7 4.6  HGB 11.4* 11.6*  HCT 34.5* 34.4*  MCV 95.8 95.0  PLT 118* 135*    Cardiac Enzymes: No results for input(s): CKTOTAL, CKMB, CKMBINDEX, TROPONINI in the last 72 hours. BNP: Invalid input(s): POCBNP D-Dimer: No results for input(s): DDIMER in the last 72 hours. Hemoglobin A1C: No results for input(s): HGBA1C in the last 72 hours. Fasting Lipid Panel: No results for input(s): CHOL, HDL, LDLCALC, TRIG, CHOLHDL, LDLDIRECT in the last 72 hours. Thyroid Function Tests: No results for input(s): TSH, T4TOTAL, T3FREE, THYROIDAB in the last 72 hours.  Invalid input(s): FREET3 Anemia Panel: No results for input(s): VITAMINB12, FOLATE, FERRITIN, TIBC, IRON, RETICCTPCT in the last 72 hours.  RADIOLOGY: Dg Chest 2 View  07/20/2014   CLINICAL DATA:  Status post pacemaker placement  EXAM: CHEST  2 VIEW  COMPARISON:  Portable chest x-ray of July 19, 2014 and March 24, 2014.  FINDINGS: The lungs are well-expanded. There is no right-sided pneumothorax or hemothorax. There is chronic scarring of the pleura at the left lung base laterally and there is chronic blunting of the right lateral costophrenic angle. The cardiac silhouette is mildly enlarged. The pulmonary vascularity is normal. The permanent pacemaker electrodes are in reasonable position radiographically. The left  internal jugular venous catheter tip projects over the proximal SVC. There are 7 intact sternal wires from previous median sternotomy. The bony thorax exhibits no acute abnormality.  IMPRESSION: There is no postprocedure complication following permanent pacemaker placement. The support tubes and lines are in appropriate position.   Electronically Signed   By: David  Martinique   On: 07/20/2014 07:35   Dg Chest Port 1 View  07/23/2014   CLINICAL DATA:  Status post pacemaker placement  EXAM: PORTABLE CHEST - 1 VIEW  COMPARISON:  07/20/2014  FINDINGS: The previously seen right-sided pacing device has been removed and a new 3 lead pacing device placed in satisfactory position. No pneumothorax is noted. Mild  chronic interstitial changes are noted. No focal infiltrate is seen. The left jugular central line has been removed.  IMPRESSION: No pneumothorax following pacer placement   Electronically Signed   By: Inez Catalina M.D.   On: 07/23/2014 16:09   Dg Chest Port 1 View  07/19/2014   CLINICAL DATA:  Pacemaker lead removal/extraction, central line placement  EXAM: PORTABLE CHEST - 1 VIEW  COMPARISON:  03/24/2014  FINDINGS: Right-sided pacer in place with single lead terminating over the expected location of the right ventricle. Left IJ central venous catheter tip terminates at the brachiocephalic/SVC junction. Evidence of median sternotomy. Heart size at upper limits of normal. Lungs are hypoaerated with crowding of the bronchovascular markings and curvilinear bibasilar atelectasis. Trace bilateral pleural effusions are present. No acute osseous abnormality. No pneumothorax.  IMPRESSION: Low volumes with probable bibasilar atelectasis. Consider PA and lateral chest radiographs obtained at full inspiration when the patient is clinically able.  Left IJ central line tip terminating at the brachiocephalic/ SVC junction.   Electronically Signed   By: Conchita Paris M.D.   On: 07/19/2014 14:51    PHYSICAL EXAM General: NAD Neck: JVP 7 cm, no thyromegaly or thyroid nodule.  Lungs: Decreased breath sounds at bases bilaterally.  CV: Nondisplaced PMI.  Heart regular S1/S2, no S3/S4, no murmur.  No edema.   Abdomen: Soft, nontender, no hepatosplenomegaly, no distention.  Neurologic: Alert and oriented x 3.  Psych: Normal affect. Extremities: No clubbing or cyanosis. Bilateral SCDs.  PCM pocket site benign.   TELEMETRY: Reviewed telemetry pt in v-paced, suspect underlying atrial flutter.   ASSESSMENT AND PLAN: 81 yo with history of pacemaker for bradycardia, paroxysmal atrial fibrillation, CAD s/p CABG and PCIs, and ischemic cardiomyopathy EF 20-25%. Underwent CRT-D upgrade 10/08/13 due to 85% RV pacing.EF  improved to 35-40%.  Now s/p explantation of CRT-D device and placement of temp-perm device due to suspected pocket infection. He had CRT-P device placed on 3/4.  1. CRT-D device pocket infection: s/p explantation. Had temporary-permanent device, now with St Jude CRT-P re-implanted. Afebrile, cultures no growth, WBCs normal. 2. Chronic systolic CHF: Ischemic cardiomyopathy, EF 35-40% on echo this admission.  This is stable compared to prior.  He was hypotensive post-explantation of CRT-D.  He lost capture after temp-perm placement with brief CPR and initially required pressors.  No pericardial effusion. Now off pressors. Now with re-implanted St Jude CRT-P device.   - Continue to mobilize. Cardiac rehab/PT.  - Continue Coreg and spironolactone at current doses.   - Creatinine up today, decrease torsemide back to home dose 20 mg daily.  3. Atrial flutter: Current atrial rhythm is flutter.  Continue amiodarone.  Restarted Eliquis Sunday.  Today he will undergo TEE-guided DCCV after 5th dose of Eliquis.   4. CKD: Creatinine up mildly today, decrease  torsemide to 20 mg daily.   5. Disposition: He should be able to go home after DCCV.  Home meds: amiodarone 200 mg daily, apixaban 2.5 mg bid, Coreg 6.25 mg bid, Imdur 15 daily, spironolactone 12.5 mg daily.  Needs followup 1-2 weeks CHF clinic.  Followup with Dr Lovena Le for device.  BMET 1 week.   [name]  07/27/2014 1:09 PM

## 2014-07-27 NOTE — Discharge Instructions (Signed)
° ° °  Supplemental Discharge Instructions for  Pacemaker Patients  Activity No heavy lifting or vigorous activity with your left/right arm for 6 to 8 weeks.  Do not raise your left/right arm above your head for one week.  Gradually raise your affected arm as drawn below.          07/25/14                            07/26/14                         07/27/14                      07/28/14  NO DRIVING until cleared by your doctor  WOUND CARE - Keep the wound area clean and dry.  Do not get this area wet for one week after procedure. No showers for one week after procedure; you may shower on  07/31/14   . - The tape/steri-strips on your wound will fall off; do not pull them off.  No bandage is needed on the site.  DO  NOT apply any creams, oils, or ointments to the wound area. - If you notice any drainage or discharge from the wound, any swelling or bruising at the site, or you develop a fever > 101? F after you are discharged home, call the office at once.  Special Instructions - You are still able to use cellular telephones; use the ear opposite the side where you have your pacemaker/defibrillator.  Avoid carrying your cellular phone near your device. - When traveling through airports, show security personnel your identification card to avoid being screened in the metal detectors.  Ask the security personnel to use the hand wand. - Avoid arc welding equipment, MRI testing (magnetic resonance imaging), TENS units (transcutaneous nerve stimulators).  Call the office for questions about other devices. - Avoid electrical appliances that are in poor condition or are not properly grounded. - Microwave ovens are safe to be near or to operate.

## 2014-07-27 NOTE — Anesthesia Procedure Notes (Signed)
Procedure Name: MAC Date/Time: 07/27/2014 2:15 PM Performed by: Trixie Deis A Pre-anesthesia Checklist: Timeout performed, Patient identified, Emergency Drugs available, Suction available and Patient being monitored Patient Re-evaluated:Patient Re-evaluated prior to inductionOxygen Delivery Method: Nasal cannula Placement Confirmation: positive ETCO2

## 2014-07-27 NOTE — Transfer of Care (Signed)
Immediate Anesthesia Transfer of Care Note  Patient: Marcus [name]  Procedure(s) Performed: Procedure(s): TRANSESOPHAGEAL ECHOCARDIOGRAM (TEE) ([name]) CARDIOVERSION ([name])  Patient Location: Endoscopy Unit  Anesthesia Type:MAC  Level of Consciousness: awake, alert  and oriented  Airway & Oxygen Therapy: Patient Spontanous Breathing and Patient connected to nasal cannula oxygen  Post-op Assessment: Report given to RN, Post -op Vital signs reviewed and stable and Patient moving all extremities  Post vital signs: Reviewed and stable  Last Vitals:  Filed Vitals:   07/27/14 1330  BP: 125/71  Pulse:   Temp: 36.4 C  Resp: 16    Complications: No apparent anesthesia complications

## 2014-07-27 NOTE — Anesthesia Postprocedure Evaluation (Signed)
  Anesthesia Post-op Note  Patient: Marcus [name]  Procedure(s) Performed: Procedure(s): TRANSESOPHAGEAL ECHOCARDIOGRAM (TEE) ([name]) CARDIOVERSION ([name])  Patient Location: PACU  Anesthesia Type:MAC  Level of Consciousness: awake, alert , oriented and patient cooperative  Airway and Oxygen Therapy: Patient Spontanous Breathing  Post-op Pain: none  Post-op Assessment: Post-op Vital signs reviewed, Patient's Cardiovascular Status Stable, Respiratory Function Stable, Patent Airway, No signs of Nausea or vomiting and Pain level controlled  Post-op Vital Signs: stable  Last Vitals:  Filed Vitals:   07/27/14 1510  BP: 104/60  Pulse: 68  Temp:   Resp: 17    Complications: No apparent anesthesia complications

## 2014-07-27 NOTE — Progress Notes (Signed)
  Echocardiogram Echocardiogram Transesophageal has been performed.  Marcus [name] 07/27/2014, 2:31 PM

## 2014-07-28 ENCOUNTER — Ambulatory Visit: Payer: Medicare Other

## 2014-07-28 ENCOUNTER — Encounter (HOSPITAL_COMMUNITY): Payer: Self-pay | Admitting: Cardiology

## 2014-08-04 ENCOUNTER — Ambulatory Visit (INDEPENDENT_AMBULATORY_CARE_PROVIDER_SITE_OTHER): Payer: Medicare Other | Admitting: *Deleted

## 2014-08-04 ENCOUNTER — Ambulatory Visit (HOSPITAL_COMMUNITY)
Admission: RE | Admit: 2014-08-04 | Discharge: 2014-08-04 | Disposition: A | Discharge status: Home / Self Care | Payer: Medicare Other | Source: Ambulatory Visit | Attending: Internal Medicine | Admitting: Internal Medicine

## 2014-08-04 DIAGNOSIS — Z950 Presence of cardiac pacemaker: Secondary | ICD-10-CM | POA: Diagnosis not present

## 2014-08-04 DIAGNOSIS — I255 Ischemic cardiomyopathy: Secondary | ICD-10-CM | POA: Diagnosis not present

## 2014-08-04 DIAGNOSIS — I495 Sick sinus syndrome: Secondary | ICD-10-CM

## 2014-08-04 DIAGNOSIS — I5022 Chronic systolic (congestive) heart failure: Secondary | ICD-10-CM | POA: Diagnosis not present

## 2014-08-04 DIAGNOSIS — R079 Chest pain, unspecified: Secondary | ICD-10-CM | POA: Diagnosis not present

## 2014-08-04 DIAGNOSIS — J90 Pleural effusion, not elsewhere classified: Secondary | ICD-10-CM | POA: Insufficient documentation

## 2014-08-04 DIAGNOSIS — I480 Paroxysmal atrial fibrillation: Secondary | ICD-10-CM

## 2014-08-04 DIAGNOSIS — I5023 Acute on chronic systolic (congestive) heart failure: Secondary | ICD-10-CM

## 2014-08-04 LAB — MDC_IDC_ENUM_SESS_TYPE_INCLINIC
Battery Remaining Longevity: 33.6 mo
Battery Voltage: 3.01 V
Brady Statistic RA Percent Paced: 99 %
Date Time Interrogation Session: 20160316141220
Implantable Pulse Generator Model: 3242
Implantable Pulse Generator Serial Number: 7725037
Lead Channel Impedance Value: 562.5 Ohm
Lead Channel Impedance Value: 562.5 Ohm
Lead Channel Pacing Threshold Amplitude: 1 V
Lead Channel Pacing Threshold Amplitude: 1 V
Lead Channel Pacing Threshold Pulse Width: 0.5 ms
Lead Channel Pacing Threshold Pulse Width: 0.5 ms
Lead Channel Pacing Threshold Pulse Width: 0.8 ms
Lead Channel Pacing Threshold Pulse Width: 1 ms
Lead Channel Sensing Intrinsic Amplitude: 12 mV
Lead Channel Setting Pacing Pulse Width: 0.5 ms
Lead Channel Setting Sensing Sensitivity: 5 mV
MDC IDC MSMT LEADCHNL LV IMPEDANCE VALUE: 725 Ohm
MDC IDC MSMT LEADCHNL LV PACING THRESHOLD AMPLITUDE: 2 V
MDC IDC MSMT LEADCHNL LV PACING THRESHOLD AMPLITUDE: 2 V
MDC IDC MSMT LEADCHNL LV PACING THRESHOLD PULSEWIDTH: 0.8 ms
MDC IDC MSMT LEADCHNL RA PACING THRESHOLD AMPLITUDE: 1.25 V
MDC IDC MSMT LEADCHNL RA PACING THRESHOLD AMPLITUDE: 1.25 V
MDC IDC MSMT LEADCHNL RA PACING THRESHOLD PULSEWIDTH: 1 ms
MDC IDC SET LEADCHNL LV PACING AMPLITUDE: 3.5 V
MDC IDC SET LEADCHNL LV PACING PULSEWIDTH: 0.8 ms
MDC IDC SET LEADCHNL RA PACING AMPLITUDE: 3.5 V
MDC IDC SET LEADCHNL RV PACING AMPLITUDE: 3.5 V
MDC IDC STAT BRADY RV PERCENT PACED: 88 %

## 2014-08-04 NOTE — Progress Notes (Signed)
Wound check appointment. Dermabond removed from new ppm site. All sutures removed from explant site (ok per GT). Wound without redness or edema. Incision edges approximated, wound well healed. Normal device function. RV threshold, sensing, and impedances consistent with implant measurements. RA/LV threshold increases noted---PWs increased----cxr ordered---GT aware. Device programmed at 3.5V for extra safety margin until 3 month visit. Histogram distribution appropriate for patient and level of activity. No mode switches or high ventricular rates noted. Patient educated about wound care, arm mobility, lifting restrictions. ROV in 3 months with GT.

## 2014-08-10 ENCOUNTER — Ambulatory Visit (HOSPITAL_COMMUNITY)
Admission: RE | Admit: 2014-08-10 | Discharge: 2014-08-10 | Disposition: A | Discharge status: Home / Self Care | Payer: Medicare Other | Source: Ambulatory Visit | Attending: Cardiology | Admitting: Cardiology

## 2014-08-10 ENCOUNTER — Encounter (HOSPITAL_COMMUNITY): Payer: Self-pay

## 2014-08-10 VITALS — BP 112/60 | HR 85 | Resp 18 | Wt 171.8 lb

## 2014-08-10 DIAGNOSIS — Z8249 Family history of ischemic heart disease and other diseases of the circulatory system: Secondary | ICD-10-CM | POA: Diagnosis not present

## 2014-08-10 DIAGNOSIS — Z950 Presence of cardiac pacemaker: Secondary | ICD-10-CM | POA: Diagnosis not present

## 2014-08-10 DIAGNOSIS — Z7902 Long term (current) use of antithrombotics/antiplatelets: Secondary | ICD-10-CM | POA: Insufficient documentation

## 2014-08-10 DIAGNOSIS — R0600 Dyspnea, unspecified: Secondary | ICD-10-CM | POA: Diagnosis not present

## 2014-08-10 DIAGNOSIS — I5022 Chronic systolic (congestive) heart failure: Secondary | ICD-10-CM | POA: Diagnosis not present

## 2014-08-10 DIAGNOSIS — R9431 Abnormal electrocardiogram [ECG] [EKG]: Secondary | ICD-10-CM | POA: Insufficient documentation

## 2014-08-10 DIAGNOSIS — N183 Chronic kidney disease, stage 3 unspecified: Secondary | ICD-10-CM

## 2014-08-10 DIAGNOSIS — I2510 Atherosclerotic heart disease of native coronary artery without angina pectoris: Secondary | ICD-10-CM | POA: Diagnosis not present

## 2014-08-10 DIAGNOSIS — I255 Ischemic cardiomyopathy: Secondary | ICD-10-CM | POA: Insufficient documentation

## 2014-08-10 DIAGNOSIS — Z79899 Other long term (current) drug therapy: Secondary | ICD-10-CM | POA: Diagnosis not present

## 2014-08-10 DIAGNOSIS — I480 Paroxysmal atrial fibrillation: Secondary | ICD-10-CM | POA: Diagnosis not present

## 2014-08-10 DIAGNOSIS — Z951 Presence of aortocoronary bypass graft: Secondary | ICD-10-CM | POA: Diagnosis not present

## 2014-08-10 DIAGNOSIS — Z8546 Personal history of malignant neoplasm of prostate: Secondary | ICD-10-CM | POA: Insufficient documentation

## 2014-08-10 DIAGNOSIS — E785 Hyperlipidemia, unspecified: Secondary | ICD-10-CM | POA: Insufficient documentation

## 2014-08-10 DIAGNOSIS — I495 Sick sinus syndrome: Secondary | ICD-10-CM | POA: Diagnosis not present

## 2014-08-10 DIAGNOSIS — I5023 Acute on chronic systolic (congestive) heart failure: Secondary | ICD-10-CM

## 2014-08-10 DIAGNOSIS — I129 Hypertensive chronic kidney disease with stage 1 through stage 4 chronic kidney disease, or unspecified chronic kidney disease: Secondary | ICD-10-CM | POA: Diagnosis not present

## 2014-08-10 LAB — HEPATIC FUNCTION PANEL
ALBUMIN: 3.6 g/dL (ref 3.5–5.2)
ALT: 10 U/L (ref 0–53)
AST: 18 U/L (ref 0–37)
Alkaline Phosphatase: 40 U/L (ref 39–117)
BILIRUBIN INDIRECT: 0.7 mg/dL (ref 0.3–0.9)
Bilirubin, Direct: 0.1 mg/dL (ref 0.0–0.5)
Total Bilirubin: 0.8 mg/dL (ref 0.3–1.2)
Total Protein: 7.1 g/dL (ref 6.0–8.3)

## 2014-08-10 LAB — BASIC METABOLIC PANEL
Anion gap: 10 (ref 5–15)
BUN: 70 mg/dL — AB (ref 6–23)
CO2: 24 mmol/L (ref 19–32)
Calcium: 9.5 mg/dL (ref 8.4–10.5)
Chloride: 102 mmol/L (ref 96–112)
Creatinine, Ser: 2.78 mg/dL — ABNORMAL HIGH (ref 0.50–1.35)
GFR calc Af Amer: 23 mL/min — ABNORMAL LOW (ref >90)
GFR calc non Af Amer: 20 mL/min — ABNORMAL LOW (ref >90)
GLUCOSE: 102 mg/dL — AB (ref 70–99)
Potassium: 4.9 mmol/L (ref 3.5–5.1)
SODIUM: 136 mmol/L (ref 135–145)

## 2014-08-10 LAB — TSH: TSH: 0.797 u[IU]/mL (ref 0.350–4.500)

## 2014-08-10 LAB — BRAIN NATRIURETIC PEPTIDE: B Natriuretic Peptide: 709.2 pg/mL — ABNORMAL HIGH (ref 0.0–100.0)

## 2014-08-10 MED ORDER — AMIODARONE HCL 200 MG PO TABS: ORAL_TABLET | ORAL | Status: DC

## 2014-08-10 NOTE — Patient Instructions (Signed)
CHANGE Amiodarone Take 1/2 tablet every other day alternating with 1 tablet every other day.  Follow up 1 month.  Do the following things EVERYDAY: 1) Weigh yourself in the morning before breakfast. Write it down and keep it in a log. 2) Take your medicines as prescribed 3) Eat low salt foods-Limit salt (sodium) to 2000 mg per day.  4) Stay as active as you can everyday 5) Limit all fluids for the day to less than 2 liters

## 2014-08-11 NOTE — Progress Notes (Signed)
Patient ID: Marcus [name], male   DOB: 1928-07-21, 81 y.o.   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 Underwent CRT-D upgrade 10/08/13 due to 85% RV pacing.     Admitted 5/19-5/26/15 with volume overload. He was diuresed with milrinone and IV lasix and discharge weight was 153 lbs. He underwent upgrade of his ICD to CRT-D as well.  He was able to wean off milrinone and did much better after CRT upgrade.  He was admitted in 3/16 with pacemaker pocket infection.  CRT-D device was explanted and temporary-permanent pacemaker with placed.  He later had a St Jude CRT-P system re-implanted.  He was noted to be back in atrial fibrillation and had TEE-guided DCCV to NSR.  TEE showed EF 25-30%.    Symptomatically, he feels ok though not as good as he was doing prior to pacemaker replacement.  He has not been very active since getting home but denies exertional dyspnea walking around the house or short distances outside.  He has some lightheadedness occasionally.  This does not seem to be positional.  He was not orthostatic when we checked in the office today.  He just feels "out of balance."  No chest pain.   Labs (9/14). LDL 77 Labs (4/15): K 3.6, creatinine 1.9 Labs (5/15): K 3.8, creatinine 1.86 Labs (6/15): K 5.3, creatinine 1.86 Potassium stopped  Labs (7/15): K 4.4, creatinine 1.88 Labs (10/15): K 4.0, creatinine 2.18 Labs (3/16): K 4.1, creatinine 1.87, HCT 34.4  ECG: A-V sequential pacing, PVCs  PMH:  1. Symptomatic bradycardia: Medtronic CRT-D.  He developed pocket infection in 3/16 and had device extracted.  He had St Jude CRT-P system replaced.  2. HTN 3. Hyperlipidemia: Has refused statins.  4. Atrial fibrillation: History of prior DCCV.  Paroxysmal.  On Eliquis.  Has history of cardioembolism to right leg with right popliteal and tibial  embolectomy (had been off Eliquis). Has had nausea with amiodarone in the past but now tolerating.  Recurrence in 3/16, had TEE-guided DCCV.  5. Prostate cancer 6. CAD: CABG remotely with SVG-OM and LIMA-LAD.  Late 1990s had PCI to RCA.  In 12/09 had LHC with total occlusion of SVG-OM, patent LIMA-LAD, DES to LCx and DES to RCA.   7. Ischemic cardiomyopathy: Echo (3/15) with EF 20-25%, wall motion abnormalities noted, mild to moderately decreased RV systolic function. ICD upgraded to CRT-D 10/08/13.  TEE (3/16) with EF 25-30%, mildly dilated RV with mildly decreased systolic function, mild to moderate MR.  8. CKD  SH: Married, nonsmoker, lives in [place]  FH: CAD  ROS: All systems reviewed and negative except as per HPI.   Current Outpatient Prescriptions  Medication Sig Dispense Refill  . amiodarone (PACERONE) 200 MG tablet Take 1/2 tablet every other day alternating with 1 tablet every other day 30 tablet 3  . apixaban (ELIQUIS) 2.5 MG TABS tablet Take 1 tablet (2.5 mg total) by mouth 2 (two) times daily. 60 tablet 3  . Ascorbic Acid (VITAMIN C) 1000 MG tablet Take 1,000 mg by mouth daily.    Marland Kitchen CALCIUM PO Take 1 tablet by mouth daily.     . carvedilol (COREG) 6.25 MG tablet Take 1 tablet (6.25 mg total) by mouth 2 (two) times daily with a meal. 60 tablet 1  . Cholecalciferol (VITAMIN D PO) Take 1 capsule by mouth daily.     Marland Kitchen  Coenzyme Q10 (CO Q 10) 100 MG CAPS Take 1 capsule by mouth 2 (two) times daily.    . isosorbide mononitrate (IMDUR) 30 MG 24 hr tablet Take 0.5 tablets (15 mg total) by mouth daily. 45 tablet 3  . Multiple Vitamins-Minerals (PRESERVISION AREDS 2) CAPS Take 1 capsule by mouth 2 (two) times daily.     Marland Kitchen spironolactone (ALDACTONE) 25 MG tablet TAKE ONE-HALF TABLET BY MOUTH ONCE DAILY 45 tablet 3  . torsemide (DEMADEX) 20 MG tablet Take 1 tablet (20 mg total) by mouth daily.    . vitamin E 400 UNIT capsule Take 400 Units by mouth daily.     No current  facility-administered medications for this encounter.    Filed Vitals:   08/10/14 1431  BP: 112/60  Pulse: 85  Resp: 18  Weight: 171 lb 12 oz (77.905 kg)  SpO2: 100%   General: NAD, wife present Neck: JVP 7-8 no thyromegaly or thyroid nodule.  Lungs: Clear to auscultation bilaterally with normal respiratory effort. CV: Nondisplaced PMI.  Heart regular S1/S2, 1/6 SEM. Trace ankle edema.  No carotid bruit.  Normal pedal pulses.  Abdomen: Soft, nontender, no hepatosplenomegaly, no distention.  Skin: Intact without lesions or rashes.  Neurologic: Alert and oriented x 3.  Psych: Normal affect. Extremities: No clubbing or cyanosis.   Assessment/Plan:  1. Chronic systolic CHF: Ischemic cardiomyopathy.  EF 25-30% on 3/16 TEE.  Has St Jude CRT-P device (placed after pacemaker pocket infection).  Probably NYHA class III symptoms.  He is not significantly volume overloaded on exam. - Continue torsemide 20 mg daily. - Continue current spironolactone and Coreg.  At next appt, will try to restart hydralazine/nitrates.  Not on ACEI with CKD. - BMET/BNP today.  2. Atrial fibrillation: Paroxysmal. Not in atrial fibrillation today.  He is concerned that his balance is off because of amiodarone.  This is certainly possible.  I will decrease his dose today to 200 mg daily alternating with 100 mg daily.  Check LFTs, TSH. Will need yearly eye exam.  He is on Eliquis 2.5 mg bid. 3. CAD: Stable. No chest pain. Continue medical management. He is not currently on a statin (he has declined).  He is not on ASA given use of Eliquis and stable CAD.  4. Sick sinus syndrome: s/p CRT-P. 5. CKD, stage III: Baseline creatinine 1.8-1.9. Check BMET today.   [name] MD 08/11/2014

## 2014-08-12 ENCOUNTER — Telehealth (HOSPITAL_COMMUNITY): Payer: Self-pay | Admitting: *Deleted

## 2014-08-12 MED ORDER — TORSEMIDE 20 MG PO TABS: 20.0000 mg | ORAL_TABLET | ORAL | Status: DC

## 2014-08-12 NOTE — Telephone Encounter (Signed)
Pt aware, will make changes, repeat labs 3/30

## 2014-08-12 NOTE — Telephone Encounter (Signed)
-----   Message from [name], MD sent at 08/10/2014  7:04 PM EDT ----- Creatinine is up a lot.  Hold torsemide x 2 days then decrease to torsemide 20 mg every other day.  Confirm that he is only taking 20 daily torsemide and is not on an ACEI.  Make sure he is using no NSAIDs.  Repeat BMET 1 week.

## 2014-08-18 ENCOUNTER — Ambulatory Visit (HOSPITAL_COMMUNITY)
Admission: RE | Admit: 2014-08-18 | Discharge: 2014-08-18 | Disposition: A | Discharge status: Home / Self Care | Payer: Medicare Other | Source: Ambulatory Visit | Attending: Internal Medicine | Admitting: Internal Medicine

## 2014-08-18 DIAGNOSIS — I5022 Chronic systolic (congestive) heart failure: Secondary | ICD-10-CM | POA: Diagnosis not present

## 2014-08-18 LAB — BASIC METABOLIC PANEL
Anion gap: 3 — ABNORMAL LOW (ref 5–15)
BUN: 42 mg/dL — AB (ref 6–23)
CALCIUM: 9.2 mg/dL (ref 8.4–10.5)
CO2: 29 mmol/L (ref 19–32)
CREATININE: 1.97 mg/dL — AB (ref 0.50–1.35)
Chloride: 109 mmol/L (ref 96–112)
GFR calc Af Amer: 34 mL/min — ABNORMAL LOW (ref >90)
GFR calc non Af Amer: 30 mL/min — ABNORMAL LOW (ref >90)
Glucose, Bld: 99 mg/dL (ref 70–99)
Potassium: 4.6 mmol/L (ref 3.5–5.1)
SODIUM: 141 mmol/L (ref 135–145)

## 2014-08-19 ENCOUNTER — Encounter: Payer: Self-pay | Admitting: Internal Medicine

## 2014-08-24 ENCOUNTER — Encounter: Payer: Self-pay | Admitting: Vascular Surgery

## 2014-08-25 ENCOUNTER — Ambulatory Visit (HOSPITAL_COMMUNITY)
Admission: RE | Admit: 2014-08-25 | Discharge: 2014-08-25 | Disposition: A | Discharge status: Home / Self Care | Payer: Medicare Other | Source: Ambulatory Visit | Attending: Vascular Surgery | Admitting: Vascular Surgery

## 2014-08-25 ENCOUNTER — Ambulatory Visit: Payer: Medicare Other | Admitting: Vascular Surgery

## 2014-08-25 DIAGNOSIS — Z48812 Encounter for surgical aftercare following surgery on the circulatory system: Secondary | ICD-10-CM

## 2014-08-25 DIAGNOSIS — I739 Peripheral vascular disease, unspecified: Secondary | ICD-10-CM

## 2014-09-03 ENCOUNTER — Telehealth (HOSPITAL_COMMUNITY): Payer: Self-pay | Admitting: *Deleted

## 2014-09-03 MED ORDER — AMOXICILLIN 500 MG PO TABS: 2000.0000 mg | ORAL_TABLET | ORAL | Status: DC | PRN

## 2014-09-03 NOTE — Telephone Encounter (Signed)
Pt's wife called, she states pt is sch for dental work and wants to know if he needs antibiotics, per Dr Aundra Dubin since pt recently had pacemaker replaced secondary to infection go ahead and give antibiotics, pt's wife aware, rx sent in

## 2014-09-08 ENCOUNTER — Ambulatory Visit (HOSPITAL_COMMUNITY)
Admission: RE | Admit: 2014-09-08 | Discharge: 2014-09-08 | Disposition: A | Discharge status: Home / Self Care | Payer: Medicare Other | Source: Ambulatory Visit | Attending: Internal Medicine | Admitting: Internal Medicine

## 2014-09-08 VITALS — BP 102/58 | HR 98 | Wt 171.8 lb

## 2014-09-08 DIAGNOSIS — I5023 Acute on chronic systolic (congestive) heart failure: Secondary | ICD-10-CM

## 2014-09-08 DIAGNOSIS — I129 Hypertensive chronic kidney disease with stage 1 through stage 4 chronic kidney disease, or unspecified chronic kidney disease: Secondary | ICD-10-CM | POA: Insufficient documentation

## 2014-09-08 DIAGNOSIS — I4892 Unspecified atrial flutter: Secondary | ICD-10-CM

## 2014-09-08 DIAGNOSIS — Z7902 Long term (current) use of antithrombotics/antiplatelets: Secondary | ICD-10-CM | POA: Insufficient documentation

## 2014-09-08 DIAGNOSIS — Z950 Presence of cardiac pacemaker: Secondary | ICD-10-CM | POA: Diagnosis not present

## 2014-09-08 DIAGNOSIS — N183 Chronic kidney disease, stage 3 unspecified: Secondary | ICD-10-CM

## 2014-09-08 DIAGNOSIS — I2510 Atherosclerotic heart disease of native coronary artery without angina pectoris: Secondary | ICD-10-CM | POA: Diagnosis not present

## 2014-09-08 DIAGNOSIS — Z951 Presence of aortocoronary bypass graft: Secondary | ICD-10-CM | POA: Insufficient documentation

## 2014-09-08 DIAGNOSIS — Z79899 Other long term (current) drug therapy: Secondary | ICD-10-CM | POA: Insufficient documentation

## 2014-09-08 DIAGNOSIS — I4891 Unspecified atrial fibrillation: Secondary | ICD-10-CM | POA: Diagnosis present

## 2014-09-08 DIAGNOSIS — I5022 Chronic systolic (congestive) heart failure: Secondary | ICD-10-CM | POA: Insufficient documentation

## 2014-09-08 DIAGNOSIS — I480 Paroxysmal atrial fibrillation: Secondary | ICD-10-CM | POA: Insufficient documentation

## 2014-09-08 DIAGNOSIS — Z8546 Personal history of malignant neoplasm of prostate: Secondary | ICD-10-CM | POA: Diagnosis not present

## 2014-09-08 DIAGNOSIS — I495 Sick sinus syndrome: Secondary | ICD-10-CM | POA: Insufficient documentation

## 2014-09-08 DIAGNOSIS — I255 Ischemic cardiomyopathy: Secondary | ICD-10-CM | POA: Diagnosis not present

## 2014-09-08 LAB — BASIC METABOLIC PANEL
ANION GAP: 9 (ref 5–15)
BUN: 51 mg/dL — ABNORMAL HIGH (ref 6–23)
CALCIUM: 9.8 mg/dL (ref 8.4–10.5)
CO2: 26 mmol/L (ref 19–32)
CREATININE: 2.56 mg/dL — AB (ref 0.50–1.35)
Chloride: 105 mmol/L (ref 96–112)
GFR calc Af Amer: 25 mL/min — ABNORMAL LOW (ref >90)
GFR, EST NON AFRICAN AMERICAN: 22 mL/min — AB (ref >90)
Glucose, Bld: 86 mg/dL (ref 70–99)
Potassium: 4.4 mmol/L (ref 3.5–5.1)
SODIUM: 140 mmol/L (ref 135–145)

## 2014-09-08 MED ORDER — CARVEDILOL 6.25 MG PO TABS: 9.3750 mg | ORAL_TABLET | Freq: Two times a day (BID) | ORAL | Status: DC

## 2014-09-08 MED ORDER — AMIODARONE HCL 200 MG PO TABS: 100.0000 mg | ORAL_TABLET | Freq: Every day | ORAL | Status: DC

## 2014-09-08 NOTE — Patient Instructions (Signed)
Change Amiodarone to 100 mg daily  Increase Carvedilol to 9.375 mg (1 and 1/2 tabs) Twice daily   Lab today  We will contact you in 2 months to schedule your next appointment.

## 2014-09-08 NOTE — Progress Notes (Signed)
Patient ID: Marcus [name], male   DOB: 08/02/1928, 81 y.o.   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 Underwent CRT-D upgrade 10/08/13 due to 85% RV pacing.     Admitted 5/19-5/26/15 with volume overload. He was diuresed with milrinone and IV lasix and discharge weight was 153 lbs. He underwent upgrade of his ICD to CRT-D as well.  He was able to wean off milrinone and did much better after CRT upgrade.  He was admitted in 3/16 with pacemaker pocket infection.  CRT-D device was explanted and temporary-permanent pacemaker with placed.  He later had a St Jude CRT-P system re-implanted.  He was noted to be back in atrial fibrillation and had TEE-guided DCCV to NSR.  TEE showed EF 25-30%.    He remains out of atrial fibrillation today.  Weight is stable.  He is taking torsemide every other day.  No dyspnea walking on flat ground.  Mild dyspnea with stairs.  No lightheadedness or syncope. No orthopnea/PND.  On days he takes full 200 mg amiodarone (alternates 200 daily and 100 daily), he is more fatigued/lethargic.   Labs (9/14). LDL 77 Labs (4/15): K 3.6, creatinine 1.9 Labs (5/15): K 3.8, creatinine 1.86 Labs (6/15): K 5.3, creatinine 1.86 Potassium stopped  Labs (7/15): K 4.4, creatinine 1.88 Labs (10/15): K 4.0, creatinine 2.18 Labs (3/16): K 4.1 => 4.6, creatinine 1.87 => 1.97, HCT 34.4, TSH normal, LFTs normal.   ECG: A-V sequential pacing, PVCs  PMH:  1. Symptomatic bradycardia: Medtronic CRT-D.  He developed pocket infection in 3/16 and had device extracted.  He had St Jude CRT-P system replaced.  2. HTN 3. Hyperlipidemia: Has refused statins.  4. Atrial fibrillation: History of prior DCCV.  Paroxysmal.  On Eliquis.  Has history of cardioembolism to right leg with right popliteal and tibial embolectomy (had been off Eliquis). Has had nausea with  amiodarone in the past but now tolerating.  Recurrence in 3/16, had TEE-guided DCCV.  5. Prostate cancer 6. CAD: CABG remotely with SVG-OM and LIMA-LAD.  Late 1990s had PCI to RCA.  In 12/09 had LHC with total occlusion of SVG-OM, patent LIMA-LAD, DES to LCx and DES to RCA.   7. Ischemic cardiomyopathy: Echo (3/15) with EF 20-25%, wall motion abnormalities noted, mild to moderately decreased RV systolic function. ICD upgraded to CRT-D 10/08/13.  TEE (3/16) with EF 25-30%, mildly dilated RV with mildly decreased systolic function, mild to moderate MR.  8. CKD  SH: Married, nonsmoker, lives in [place]  FH: CAD  ROS: All systems reviewed and negative except as per HPI.   Current Outpatient Prescriptions  Medication Sig Dispense Refill  . amiodarone (PACERONE) 200 MG tablet Take 0.5 tablets (100 mg total) by mouth daily. 30 tablet 3  . amoxicillin (AMOXIL) 500 MG tablet Take 4 tablets (2,000 mg total) by mouth as needed (1 hour prior to dental work). 4 tablet 3  . apixaban (ELIQUIS) 2.5 MG TABS tablet Take 1 tablet (2.5 mg total) by mouth 2 (two) times daily. 60 tablet 3  . Ascorbic Acid (VITAMIN C) 1000 MG tablet Take 1,000 mg by mouth daily.    Marland Kitchen CALCIUM PO Take 1 tablet by mouth daily.     . carvedilol (COREG) 6.25 MG tablet Take 1.5 tablets (9.375 mg total) by mouth 2 (two) times daily with a meal. 90 tablet 3  .  Cholecalciferol (VITAMIN D PO) Take 1 capsule by mouth daily.     . Coenzyme Q10 (CO Q 10) 100 MG CAPS Take 1 capsule by mouth 2 (two) times daily.    . isosorbide mononitrate (IMDUR) 30 MG 24 hr tablet Take 0.5 tablets (15 mg total) by mouth daily. 45 tablet 3  . Multiple Vitamins-Minerals (PRESERVISION AREDS 2) CAPS Take 1 capsule by mouth 2 (two) times daily.     Marland Kitchen spironolactone (ALDACTONE) 25 MG tablet TAKE ONE-HALF TABLET BY MOUTH ONCE DAILY 45 tablet 3  . torsemide (DEMADEX) 20 MG tablet Take 1 tablet (20 mg total) by mouth every other day.    . vitamin E 400 UNIT capsule  Take 400 Units by mouth daily.     No current facility-administered medications for this encounter.    Filed Vitals:   09/08/14 1131  BP: 102/58  Pulse: 98  Weight: 171 lb 12 oz (77.905 kg)  SpO2: 99%   General: NAD, wife present Neck: JVP 7 no thyromegaly or thyroid nodule.  Lungs: Clear to auscultation bilaterally with normal respiratory effort. CV: Nondisplaced PMI.  Heart regular S1/S2, 1/6 SEM. No ankle edema.  No carotid bruit.  Normal pedal pulses.  Abdomen: Soft, nontender, no hepatosplenomegaly, no distention.  Skin: Intact without lesions or rashes.  Neurologic: Alert and oriented x 3.  Psych: Normal affect. Extremities: No clubbing or cyanosis.   Assessment/Plan:  1. Chronic systolic CHF: Ischemic cardiomyopathy.  EF 25-30% on 3/16 TEE.  Has St Jude CRT-P device (placed after pacemaker pocket infection).  NYHA class II symptoms.  He is not significantly volume overloaded on exam. - Continue torsemide 20 mg every other day.   - Continue current spironolactone.   - Increase Coreg to 9.375 mg bid.  - At next appt, will try to restart hydralazine/nitrates.  Not on ACEI with CKD. - BMET today.  2. Atrial fibrillation: Paroxysmal. Not in atrial fibrillation today.  He is concerned that his balance is off and that he is fatigued because of amiodarone.  This is certainly possible.  I will decrease his dose to 100 mg every day.  LFTs/TSH recently were normal. Will need yearly eye exam.  He is on Eliquis 2.5 mg bid. 3. CAD: Stable. No chest pain. Continue medical management. He is not currently on a statin (he has declined).  He is not on ASA given use of Eliquis and stable CAD.  4. Sick sinus syndrome: s/p CRT-P. 5. CKD, stage III: Baseline creatinine 1.8-1.9. Check BMET today.   [name] MD 09/08/2014

## 2014-09-09 NOTE — Addendum Note (Signed)
Encounter addended by: Georga Kaufmann, CCT on: 09/09/2014  7:27 AM<BR>     Documentation filed: Charges VN

## 2014-09-13 ENCOUNTER — Telehealth (HOSPITAL_COMMUNITY): Payer: Self-pay

## 2014-09-13 NOTE — Telephone Encounter (Signed)
LVMTCB.  Per Dr. Aundra Dubin, needs to decrease Torsemide to twice weekly and repeat BMET 1-2 weeks after med change.  Renee Pain

## 2014-09-15 ENCOUNTER — Telehealth (HOSPITAL_COMMUNITY): Payer: Self-pay | Admitting: *Deleted

## 2014-09-15 MED ORDER — TORSEMIDE 20 MG PO TABS
20.0000 mg | ORAL_TABLET | ORAL | Status: DC
Start: 2014-09-15 — End: 2014-10-12

## 2014-09-15 NOTE — Telephone Encounter (Signed)
-----   Message from [name], MD sent at 09/09/2014  2:29 PM EDT ----- Creatinine a bit higher, would have him come back in a week for repeat BMET.  He can decrease torsemide to every 3rd day but weigh daily.

## 2014-09-15 NOTE — Telephone Encounter (Signed)
Pt's wife aware, repeat labs 4/29

## 2014-09-17 ENCOUNTER — Ambulatory Visit (HOSPITAL_COMMUNITY)
Admission: RE | Admit: 2014-09-17 | Discharge: 2014-09-17 | Disposition: A | Discharge status: Home / Self Care | Payer: Medicare Other | Source: Ambulatory Visit | Attending: Internal Medicine | Admitting: Internal Medicine

## 2014-09-17 DIAGNOSIS — I5022 Chronic systolic (congestive) heart failure: Secondary | ICD-10-CM | POA: Insufficient documentation

## 2014-09-17 LAB — BASIC METABOLIC PANEL
ANION GAP: 8 (ref 5–15)
BUN: 47 mg/dL — ABNORMAL HIGH (ref 6–23)
CHLORIDE: 108 mmol/L (ref 96–112)
CO2: 27 mmol/L (ref 19–32)
CREATININE: 2.38 mg/dL — AB (ref 0.50–1.35)
Calcium: 9.4 mg/dL (ref 8.4–10.5)
GFR, EST AFRICAN AMERICAN: 27 mL/min — AB (ref >90)
GFR, EST NON AFRICAN AMERICAN: 24 mL/min — AB (ref >90)
GLUCOSE: 103 mg/dL — AB (ref 70–99)
POTASSIUM: 4.3 mmol/L (ref 3.5–5.1)
SODIUM: 143 mmol/L (ref 135–145)

## 2014-10-12 ENCOUNTER — Telehealth (HOSPITAL_COMMUNITY): Payer: Self-pay | Admitting: Cardiology

## 2014-10-12 MED ORDER — TORSEMIDE 20 MG PO TABS: 20.0000 mg | ORAL_TABLET | Freq: Every day | ORAL | Status: DC

## 2014-10-12 NOTE — Telephone Encounter (Signed)
Increase torsemide to 20 mg daily for now, BMET in 1 week and followup soon when he can be fit in.

## 2014-10-12 NOTE — Telephone Encounter (Signed)
Pt's wife aware and agreeable, labs sch for 6/1, pt is sch to see Dr Lovena Le 6/9, if SOB not improving by end of the week she will call us back

## 2014-10-12 NOTE — Telephone Encounter (Signed)
pts wife called to report increased SOB, Pt is really having a difficult time Chest pressure/pain yesterday Pt states his weight on home scale is 172 today, normally he is 168 Denies swelling Currently taking torsemide every 3 days, last taken in Sunday   Please advise

## 2014-10-20 ENCOUNTER — Ambulatory Visit (HOSPITAL_COMMUNITY)
Admission: RE | Admit: 2014-10-20 | Discharge: 2014-10-20 | Disposition: A | Discharge status: Home / Self Care | Payer: Medicare Other | Source: Ambulatory Visit | Attending: Cardiology | Admitting: Cardiology

## 2014-10-20 DIAGNOSIS — I5022 Chronic systolic (congestive) heart failure: Secondary | ICD-10-CM | POA: Diagnosis not present

## 2014-10-20 LAB — BASIC METABOLIC PANEL
ANION GAP: 9 (ref 5–15)
BUN: 44 mg/dL — ABNORMAL HIGH (ref 6–20)
CO2: 28 mmol/L (ref 22–32)
Calcium: 9.4 mg/dL (ref 8.9–10.3)
Chloride: 104 mmol/L (ref 101–111)
Creatinine, Ser: 2.34 mg/dL — ABNORMAL HIGH (ref 0.61–1.24)
GFR calc Af Amer: 28 mL/min — ABNORMAL LOW (ref >60)
GFR calc non Af Amer: 24 mL/min — ABNORMAL LOW (ref >60)
GLUCOSE: 95 mg/dL (ref 65–99)
POTASSIUM: 4.2 mmol/L (ref 3.5–5.1)
Sodium: 141 mmol/L (ref 135–145)

## 2014-10-28 ENCOUNTER — Encounter: Payer: Self-pay | Admitting: Internal Medicine

## 2014-10-28 ENCOUNTER — Ambulatory Visit (INDEPENDENT_AMBULATORY_CARE_PROVIDER_SITE_OTHER): Payer: Medicare Other | Admitting: Internal Medicine

## 2014-10-28 VITALS — BP 130/68 | HR 71 | Ht 69.0 in | Wt 173.0 lb

## 2014-10-28 DIAGNOSIS — I10 Essential (primary) hypertension: Secondary | ICD-10-CM | POA: Diagnosis not present

## 2014-10-28 DIAGNOSIS — I5023 Acute on chronic systolic (congestive) heart failure: Secondary | ICD-10-CM

## 2014-10-28 DIAGNOSIS — I255 Ischemic cardiomyopathy: Secondary | ICD-10-CM | POA: Diagnosis not present

## 2014-10-28 DIAGNOSIS — I5022 Chronic systolic (congestive) heart failure: Secondary | ICD-10-CM | POA: Diagnosis not present

## 2014-10-28 DIAGNOSIS — Z950 Presence of cardiac pacemaker: Secondary | ICD-10-CM

## 2014-10-28 LAB — CUP PACEART INCLINIC DEVICE CHECK
Battery Remaining Longevity: 66 mo
Battery Remaining Longevity: 66 mo
Battery Voltage: 2.96 V
Battery Voltage: 2.96 V
Brady Statistic RV Percent Paced: 89 %
Brady Statistic RV Percent Paced: 89 %
Date Time Interrogation Session: 20160609160516
Date Time Interrogation Session: 20160609141803
Lead Channel Impedance Value: 825 Ohm
Lead Channel Impedance Value: 512.5 Ohm
Lead Channel Impedance Value: 575 Ohm
Lead Channel Pacing Threshold Amplitude: 1.25 V
Lead Channel Pacing Threshold Amplitude: 1.25 V
Lead Channel Pacing Threshold Amplitude: 1 V
Lead Channel Pacing Threshold Amplitude: 1.25 V
Lead Channel Pacing Threshold Amplitude: 1.25 V
Lead Channel Pacing Threshold Pulse Width: 0.5 ms
Lead Channel Pacing Threshold Pulse Width: 0.6 ms
Lead Channel Pacing Threshold Pulse Width: 0.8 ms
Lead Channel Pacing Threshold Pulse Width: 0.6 ms
Lead Channel Pacing Threshold Pulse Width: 0.8 ms
Lead Channel Sensing Intrinsic Amplitude: 12 mV
Lead Channel Sensing Intrinsic Amplitude: 1.2 mV
Lead Channel Sensing Intrinsic Amplitude: 12 mV
Lead Channel Setting Pacing Amplitude: 2.5 V
Lead Channel Setting Pacing Amplitude: 2.5 V
Lead Channel Setting Pacing Amplitude: 2.5 V
Lead Channel Setting Pacing Amplitude: 2.5 V
Lead Channel Setting Pacing Pulse Width: 0.5 ms
Lead Channel Setting Pacing Pulse Width: 0.5 ms
Lead Channel Setting Pacing Pulse Width: 0.6 ms
Lead Channel Setting Sensing Sensitivity: 5 mV
MDC IDC MSMT LEADCHNL LV IMPEDANCE VALUE: 825 Ohm
MDC IDC MSMT LEADCHNL RA IMPEDANCE VALUE: 512.5 Ohm
MDC IDC MSMT LEADCHNL RA SENSING INTR AMPL: 1.2 mV
MDC IDC MSMT LEADCHNL RV IMPEDANCE VALUE: 575 Ohm
MDC IDC MSMT LEADCHNL RV PACING THRESHOLD AMPLITUDE: 1 V
MDC IDC MSMT LEADCHNL RV PACING THRESHOLD PULSEWIDTH: 0.5 ms
MDC IDC PG MODEL: 3242
MDC IDC PG SERIAL: 7725037
MDC IDC SET LEADCHNL LV PACING PULSEWIDTH: 0.6 ms
MDC IDC SET LEADCHNL RA PACING AMPLITUDE: 2.5 V
MDC IDC SET LEADCHNL RA PACING AMPLITUDE: 2.5 V
MDC IDC SET LEADCHNL RV SENSING SENSITIVITY: 5 mV
MDC IDC STAT BRADY RA PERCENT PACED: 98 %
MDC IDC STAT BRADY RA PERCENT PACED: 98 %
Pulse Gen Model: 3242
Pulse Gen Serial Number: 7725037

## 2014-10-28 NOTE — Assessment & Plan Note (Signed)
His blood pressure is well controlled. He will continue his current meds. 

## 2014-10-28 NOTE — Patient Instructions (Addendum)
Medication Instructions:  Your physician recommends that you continue on your current medications as directed. Please refer to the Current Medication list given to you today.   Labwork: None ordered  Testing/Procedures: None ordered  Follow-Up: Your physician wants you to follow-up in: 9 months with Dr Knox Saliva will receive a reminder letter in the mail two months in advance. If you don't receive a letter, please call our office to schedule the follow-up appointment.  Remote monitoring is used to monitor your Pacemaker of ICD from home. This monitoring reduces the number of office visits required to check your device to one time per year. It allows [place] to keep an eye on the functioning of your device to ensure it is working properly. You are scheduled for a device check from home on 01/27/15. You may send your transmission at any time that day. If you have a wireless device, the transmission will be sent automatically. After your physician reviews your transmission, you will receive a postcard with your next transmission date.     Any Other Special Instructions Will Be Listed Below (If Applicable).

## 2014-10-28 NOTE — Assessment & Plan Note (Signed)
His blood pressure is well controlled. No change in medical therapy. He will maintain a low-sodium diet. 

## 2014-10-28 NOTE — Assessment & Plan Note (Signed)
His device is working normally. Will recheck in several months. 

## 2014-10-28 NOTE — Progress Notes (Signed)
HPI Marcus [name] returns today for followup. He is a pleasant 81 yo man with chb, and chronic systolic heart failure who underwent BiV PM insertion years ago and then developed erosion of his pocket and underwent extraction approx. 4 months ago followed by re-insertion of a biv pm on the contralateral side days later. He has done well in the interim with no infectious symptoms. He denies chest pain or sob. He remains active working outside. He exercises regularly. No Known Allergies   Current Outpatient Prescriptions  Medication Sig Dispense Refill  . amiodarone (PACERONE) 200 MG tablet Take 0.5 tablets (100 mg total) by mouth daily. 30 tablet 3  . amoxicillin (AMOXIL) 500 MG tablet Take 4 tablets (2,000 mg total) by mouth as needed (1 hour prior to dental work). 4 tablet 3  . apixaban (ELIQUIS) 2.5 MG TABS tablet Take 1 tablet (2.5 mg total) by mouth 2 (two) times daily. 60 tablet 3  . Ascorbic Acid (VITAMIN C) 1000 MG tablet Take 1,000 mg by mouth daily.    Marland Kitchen CALCIUM PO Take 1 tablet by mouth daily.     . carvedilol (COREG) 6.25 MG tablet Take 1.5 tablets (9.375 mg total) by mouth 2 (two) times daily with a meal. 90 tablet 3  . Cholecalciferol (VITAMIN D PO) Take 1 capsule by mouth daily.     . Coenzyme Q10 (CO Q 10) 100 MG CAPS Take 1 capsule by mouth 2 (two) times daily.    . isosorbide mononitrate (IMDUR) 30 MG 24 hr tablet Take 0.5 tablets (15 mg total) by mouth daily. 45 tablet 3  . Multiple Vitamins-Minerals (PRESERVISION AREDS 2) CAPS Take 1 capsule by mouth 2 (two) times daily.     Marland Kitchen spironolactone (ALDACTONE) 25 MG tablet TAKE ONE-HALF TABLET BY MOUTH ONCE DAILY 45 tablet 3  . torsemide (DEMADEX) 20 MG tablet Take 1 tablet (20 mg total) by mouth daily.    . vitamin E 400 UNIT capsule Take 400 Units by mouth daily.     No current facility-administered medications for this visit.     Past Medical History  Diagnosis Date  . Coronary artery disease     a. s/p remote CABG  x 2(VG->OM, LIMA->LAD;  b. Late 90's s/p PCI of RCA;  c. 12/09 Cath/PCI: LM 10m, 70-80d (3.0x41mm Xience DES), LAD100p, LCX 80-90p (2.25x23mmTaxus Atom DES), RCA 100d (2.5x75mm Xience DES), VG->OM 100, LIMA->LAD nl, EF 30-35%.  . Hyperlipidemia   . Prostate cancer   . PAF (paroxysmal atrial fibrillation)   . Ischemic cardiomyopathy     a. EF 35-40% by echo 05/2012, b. EF 20-25%, akinesis and scarring of inferolateral, inferior and inferoseptal myocardium, mild AI, mod MR, LA mod dilated, RV mildly dialted and sys fx mildly reduced, RA mildly dilated (09/2013)  c. EF 35-40% by 2D in 07/2014, 25-30% by TEE.  . Symptomatic bradycardia     a. 08/2003 s/p MDT Enpulse [id number] Dual chamber PPM ser # [id number] H. b. Device explant due to infection/temp perm insertion/PPM 07/2014.  Marland Kitchen Peripheral vascular disease   . Chronic systolic CHF (congestive heart failure)     a. s/p CRT-D 09/2013. b. Device explant with temp perm then subsequent CRT-P 07/2014.  Marland Kitchen Hard of hearing   . Hypertension   . History of gout   . History of gastric ulcer   . History of colon polyps   . Cataracts, bilateral   . Macular degeneration, dry   . History of shingles   .  CKD (chronic kidney disease), stage III   . Atrial flutter     a. Dx 07/2014, s/p TEE/DCCV.  Marland Kitchen Pacemaker infection     ROS:   All systems reviewed and negative except as noted in the HPI.   Past Surgical History  Procedure Laterality Date  . Doppler echocardiography  06/24/2002    EF 60-65%  . Tee without cardioversion  06/26/2012    Procedure: TRANSESOPHAGEAL ECHOCARDIOGRAM (TEE);  Surgeon: Lelon Perla, MD;  Location: [facility] ENDOSCOPY;  Service: Cardiovascular;  Laterality: [name];  . Cardioversion  06/26/2012    Procedure: CARDIOVERSION;  Surgeon: Lelon Perla, MD;  Location: [place];  Service: Cardiovascular;  Laterality: [name];  . Appendectomy    . Angioplasty      stent placement  . Embolectomy Right 07/28/2013    Procedure: Right Popliteal and Tibial  Embolectomy;  Surgeon: Angelia Mould, MD;  Location: [place];  Service: Vascular;  Laterality: Right;  . Permanent pacemaker generator change [name] 08/27/2012    Procedure: PERMANENT PACEMAKER GENERATOR CHANGE;  Surgeon: [name], MD;  Location: [facility] CATH LAB;  Service: Cardiovascular;  Laterality: [name];  . Bi-ventricular pacemaker upgrade [name] 10/08/2013    Procedure: BI-VENTRICULAR PACEMAKER UPGRADE;  Surgeon: [name], MD;  Location: [facility] CATH LAB;  Service: Cardiovascular;  Laterality: [name];  . Cardiac catheterization  most recent in 2009    several  . Coronary artery bypass graft  1990    left main  . Insert / replace / remove pacemaker      1st pacemaker placed 10+yrs ago  . Colonoscopy    . Coronary angioplasty      couple   . Pacemaker lead removal Right 07/19/2014    Procedure: PACEMAKER LEAD REMOVAL/EXTRACTION;  Surgeon: [name], MD;  Location: [place];  Service: Cardiovascular;  Laterality: Right;  DR. Lucianne Lei TRIGT TO BACK UP CASE  . Cardiac catheterization Right 07/19/2014    Procedure: TEMPORARY PACEMAKER;  Surgeon: [name], MD;  Location: [place];  Service: Cardiovascular;  Laterality: Right;  . Bi-ventricular pacemaker insertion [name] 07/23/2014    Procedure: BI-VENTRICULAR PACEMAKER INSERTION (CRT-P);  Surgeon: Deboraha Sprang, MD;  Location: [facility] CATH LAB;  Service: Cardiovascular;  Laterality: [name];  . Tee without cardioversion [name] 07/27/2014    Procedure: TRANSESOPHAGEAL ECHOCARDIOGRAM (TEE);  Surgeon: [name], MD;  Location: [place];  Service: Cardiovascular;  Laterality: [name];  . Cardioversion [name] 07/27/2014    Procedure: CARDIOVERSION;  Surgeon: [name], MD;  Location: [facility] ENDOSCOPY;  Service: Cardiovascular;  Laterality: [name];     Family History  Problem Relation Age of Onset  . Hypertension Mother   . Colon cancer Mother   . Cancer Mother     Colon  . Alcohol abuse Father      History   Social History  . Marital Status: Married     Spouse Name: [name]  . Number of Children: 0  . Years of Education: [name]   Occupational History  . retired    Social History Main Topics  . Smoking status: Former Smoker    Quit date: 02/10/1971  . Smokeless tobacco: Never Used     Comment: quit smoking 19yrs ago  . Alcohol Use: No     Comment: nothing in 77yrs   . Drug Use: No  . Sexual Activity: No   Other Topics Concern  . Not on file   Social History Narrative   Lives in [place] with wife.  Retired.     BP 130/68 mmHg  Pulse 71  Ht 5\' 9"  (1.753 m)  Wt 173 lb (78.472 kg)  BMI 25.54 kg/m2  SpO2 96%  Physical Exam:  Well appearing 81 yo man, NAD HEENT: Unremarkable Neck:  No JVD, no thyromegally Back:  No CVA tenderness Lungs:  Clear with no wheezes HEART:  Regular rate rhythm, no murmurs, no rubs, no clicks Abd:  soft, positive bowel sounds, no organomegally, no rebound, no guarding Ext:  2 plus pulses, no edema, no cyanosis, no clubbing Skin:  No rashes no nodules Neuro:  CN II through XII intact, motor grossly intact  DEVICE  Normal device function.  See PaceArt for details.   Assess/Plan:

## 2014-10-28 NOTE — Assessment & Plan Note (Signed)
His symptoms are class 2. He will continue his current medical therapy. Will follow.

## 2014-10-29 ENCOUNTER — Telehealth: Payer: Self-pay | Admitting: Cardiology

## 2014-10-29 ENCOUNTER — Other Ambulatory Visit: Payer: Self-pay | Admitting: Cardiology

## 2014-10-29 NOTE — Telephone Encounter (Signed)
LMOVM for pt to return call regarding ICM clinic.

## 2014-11-01 ENCOUNTER — Telehealth: Payer: Self-pay | Admitting: Cardiology

## 2014-11-01 NOTE — Telephone Encounter (Signed)
LMOVM for pt to return call in regards to ICM clinic.  

## 2014-11-03 ENCOUNTER — Telehealth: Payer: Self-pay | Admitting: Cardiology

## 2014-11-03 NOTE — Telephone Encounter (Signed)
Pt enrolled in ICM clinic. 1st transmission scheduled for 12-02-14

## 2014-11-10 ENCOUNTER — Encounter: Payer: Self-pay | Admitting: Gastroenterology

## 2014-12-01 ENCOUNTER — Telehealth: Payer: Self-pay | Admitting: Internal Medicine

## 2014-12-01 NOTE — Telephone Encounter (Signed)
2-3 weeks has not had any energy.  Only has 4-6 hours daily of stamina.  She says she thinks he is due to see Dr Aundra Dubin.  He was due in June.  She is going to call the heart failure office tomorrow and schedule.  Will review his remote and call if problems

## 2014-12-01 NOTE — Telephone Encounter (Signed)
New message     Pt is to have reading tomorrow night.  Pt wife is wanting to know if the monitor can check to see if something else is going on with his heart? Pt is having problem with fatigue and not feeling well.     Please call to discuss.

## 2014-12-02 ENCOUNTER — Ambulatory Visit (INDEPENDENT_AMBULATORY_CARE_PROVIDER_SITE_OTHER): Payer: Medicare Other | Admitting: *Deleted

## 2014-12-02 ENCOUNTER — Telehealth: Payer: Self-pay | Admitting: Cardiology

## 2014-12-02 ENCOUNTER — Encounter: Payer: Self-pay | Admitting: *Deleted

## 2014-12-02 DIAGNOSIS — Z950 Presence of cardiac pacemaker: Secondary | ICD-10-CM | POA: Diagnosis not present

## 2014-12-02 DIAGNOSIS — I5022 Chronic systolic (congestive) heart failure: Secondary | ICD-10-CM | POA: Diagnosis not present

## 2014-12-02 NOTE — Telephone Encounter (Signed)
Spoke with pt and reminded pt of remote transmission that is due today. Pt verbalized understanding.   

## 2014-12-02 NOTE — Progress Notes (Signed)
EPIC Encounter for ICM Monitoring  Patient Name: Marcus [name] is a 81 y.o. male Date: 12/02/2014 Primary Care Physican: No PCP Per Patient Primary Cardiologist: Aundra Dubin ([facility]) Electrophysiologist: Lovena Le Dry Weight: 167 lbs   Bi-V pacing: 89%      In the past month, have you:  1. Gained more than 2 pounds in a day or more than 5 pounds in a week? no  2. Had changes in your medications (with verification of current medications)? no  3. Had more shortness of breath than is usual for you? no  4. Limited your activity because of shortness of breath? no  5. Not been able to sleep because of shortness of breath? no  6. Had increased swelling in your feet or ankles? No. The patient states he has swelling around his mid section, but this is not new for him and is unchanged.   7. Had symptoms of dehydration (dizziness, dry mouth, increased thirst, decreased urine output) no  8. Had changes in sodium restriction? no  9. Been compliant with medication? Yes   ICM trend:   Follow-up plan: ICM clinic phone appointment: 01/03/15. This is my first ICM encounter with the patient. He reports doing well at the present time. No changes made today.  Copy of note sent to patient's primary care physician, primary cardiologist, and device following physician.  Alvis Lemmings, RN, BSN 12/02/2014 4:57 PM

## 2015-01-03 ENCOUNTER — Ambulatory Visit (INDEPENDENT_AMBULATORY_CARE_PROVIDER_SITE_OTHER): Payer: Medicare Other | Admitting: *Deleted

## 2015-01-03 DIAGNOSIS — I5022 Chronic systolic (congestive) heart failure: Secondary | ICD-10-CM

## 2015-01-03 DIAGNOSIS — Z950 Presence of cardiac pacemaker: Secondary | ICD-10-CM | POA: Diagnosis not present

## 2015-01-06 ENCOUNTER — Telehealth: Payer: Self-pay

## 2015-01-06 NOTE — Telephone Encounter (Signed)
ICM transmission received.  Attempted call to patient.  Voice mail message left for return call and phone number.

## 2015-01-06 NOTE — Progress Notes (Signed)
EPIC Encounter for ICM Monitoring  Patient Name: Marcus [name] is a 81 y.o. male Date: 01/06/2015 Primary Care Physican: No PCP Per Patient Primary Cardiologist: [facility] ([facility]) Electrophysiologist: Lovena Le Dry Weight: 167 lbs       In the past month, have you:  1. Gained more than 2 pounds in a day or more than 5 pounds in a week? no  2. Had changes in your medications (with verification of current medications)? no  3. Had more shortness of breath than is usual for you? no  4. Limited your activity because of shortness of breath? no  5. Not been able to sleep because of shortness of breath? no  6. Had increased swelling in your feet or ankles? no  7. Had symptoms of dehydration (dizziness, dry mouth, increased thirst, decreased urine output) no  8. Had changes in sodium restriction? no  9. Been compliant with medication? Yes   ICM trend:   Follow-up plan: ICM clinic phone appointment on 02/08/2015.  CorVue transmission shows slight elevation from baseline and encouraged patient to drink fluids. He reported he drinks fluids throughout the day. Reviewed signs of dehydration to report.  No changes today.  Copy of note sent to patient's primary care physician, primary cardiologist, and device following physician.  Rosalene Billings, RN, CCM 01/06/2015 10:48 AM

## 2015-01-06 NOTE — Telephone Encounter (Signed)
Spoke with patient.

## 2015-01-17 DIAGNOSIS — H3531 Nonexudative age-related macular degeneration: Secondary | ICD-10-CM | POA: Diagnosis not present

## 2015-01-25 ENCOUNTER — Other Ambulatory Visit (HOSPITAL_COMMUNITY): Payer: Self-pay | Admitting: *Deleted

## 2015-01-25 DIAGNOSIS — I480 Paroxysmal atrial fibrillation: Secondary | ICD-10-CM

## 2015-01-25 DIAGNOSIS — I5023 Acute on chronic systolic (congestive) heart failure: Secondary | ICD-10-CM

## 2015-01-25 MED ORDER — SPIRONOLACTONE 25 MG PO TABS: ORAL_TABLET | ORAL | Status: DC

## 2015-01-25 MED ORDER — ISOSORBIDE MONONITRATE ER 30 MG PO TB24: 15.0000 mg | ORAL_TABLET | Freq: Every day | ORAL | Status: DC

## 2015-01-25 MED ORDER — AMIODARONE HCL 200 MG PO TABS: 100.0000 mg | ORAL_TABLET | Freq: Every day | ORAL | Status: DC

## 2015-01-29 ENCOUNTER — Other Ambulatory Visit: Payer: Self-pay | Admitting: Cardiovascular Disease

## 2015-01-31 ENCOUNTER — Telehealth (HOSPITAL_COMMUNITY): Payer: Self-pay | Admitting: Vascular Surgery

## 2015-01-31 MED ORDER — TORSEMIDE 20 MG PO TABS: 20.0000 mg | ORAL_TABLET | Freq: Every day | ORAL | Status: DC

## 2015-01-31 NOTE — Telephone Encounter (Signed)
Refill Torsemide pt will be out on Friday pt may need prescription sent to local pharmacy until he can get  Mail order prescription .Marland Kitchen Please advise

## 2015-01-31 NOTE — Telephone Encounter (Signed)
rx sent to local pharm Detailed message left informing patient

## 2015-02-02 ENCOUNTER — Other Ambulatory Visit: Payer: Self-pay

## 2015-02-08 ENCOUNTER — Telehealth: Payer: Self-pay

## 2015-02-08 ENCOUNTER — Ambulatory Visit (INDEPENDENT_AMBULATORY_CARE_PROVIDER_SITE_OTHER): Payer: Medicare Other | Admitting: *Deleted

## 2015-02-08 DIAGNOSIS — I495 Sick sinus syndrome: Secondary | ICD-10-CM | POA: Diagnosis not present

## 2015-02-08 DIAGNOSIS — I5022 Chronic systolic (congestive) heart failure: Secondary | ICD-10-CM | POA: Diagnosis not present

## 2015-02-08 DIAGNOSIS — Z950 Presence of cardiac pacemaker: Secondary | ICD-10-CM

## 2015-02-08 NOTE — Telephone Encounter (Signed)
ICM transmission received.  Left message for patient to return call.

## 2015-02-08 NOTE — Progress Notes (Signed)
Remote pacemaker transmission.   

## 2015-02-09 NOTE — Progress Notes (Signed)
EPIC Encounter for ICM Monitoring  Patient Name: Marcus [name] is a 81 y.o. male Date: 02/09/2015 Primary Care Physican: No PCP Per Patient Primary Cardiologist: Aundra Dubin ([facility]) Electrophysiologist: Lovena Le Dry Weight: 166 lbs       In the past month, have you:  1. Gained more than 2 pounds in a day or more than 5 pounds in a week? no  2. Had changes in your medications (with verification of current medications)? no  3. Had more shortness of breath than is usual for you? no  4. Limited your activity because of shortness of breath? no  5. Not been able to sleep because of shortness of breath? no  6. Had increased swelling in your feet or ankles? no  7. Had symptoms of dehydration (dizziness, dry mouth, increased thirst, decreased urine output) no  8. Had changes in sodium restriction? no  9. Been compliant with medication? Yes   ICM trend:   Follow-up plan: ICM clinic phone appointment 03/24/2015 and he has appointment with HF clinic on 02/23/2015.  Impedance below baseline ~02/01/2015 to 02/06/2015 and returning back to baseline.  He denied any HF symptoms during that time.  He stated he is doing well.  No changes today.    Copy of note sent to patient's primary care physician, primary cardiologist, and device following physician.  Rosalene Billings, RN, CCM 02/09/2015 10:35 AM

## 2015-02-09 NOTE — Telephone Encounter (Signed)
Spoke with patient.

## 2015-02-14 LAB — CUP PACEART REMOTE DEVICE CHECK
Battery Remaining Longevity: 73 mo
Battery Remaining Percentage: 95.5 %
Brady Statistic AP VP Percent: 90 %
Brady Statistic AS VP Percent: 1 %
Brady Statistic RA Percent Paced: 98 %
Date Time Interrogation Session: 20160920060020
Lead Channel Impedance Value: 490 Ohm
Lead Channel Impedance Value: 550 Ohm
Lead Channel Impedance Value: 850 Ohm
Lead Channel Pacing Threshold Amplitude: 1 V
Lead Channel Pacing Threshold Amplitude: 1.25 V
Lead Channel Pacing Threshold Amplitude: 1.25 V
Lead Channel Sensing Intrinsic Amplitude: 12 mV
Lead Channel Setting Pacing Amplitude: 2.5 V
Lead Channel Setting Pacing Amplitude: 2.5 V
Lead Channel Setting Pacing Amplitude: 2.5 V
Lead Channel Setting Pacing Pulse Width: 0.5 ms
Lead Channel Setting Pacing Pulse Width: 0.6 ms
Lead Channel Setting Sensing Sensitivity: 5 mV
MDC IDC MSMT BATTERY VOLTAGE: 2.99 V
MDC IDC MSMT LEADCHNL LV PACING THRESHOLD PULSEWIDTH: 0.6 ms
MDC IDC MSMT LEADCHNL RA PACING THRESHOLD PULSEWIDTH: 0.8 ms
MDC IDC MSMT LEADCHNL RA SENSING INTR AMPL: 3.4 mV
MDC IDC MSMT LEADCHNL RV PACING THRESHOLD PULSEWIDTH: 0.5 ms
MDC IDC PG MODEL: 3242
MDC IDC PG SERIAL: 7725037
MDC IDC STAT BRADY AP VS PERCENT: 9.3 %
MDC IDC STAT BRADY AS VS PERCENT: 1 %

## 2015-02-23 ENCOUNTER — Ambulatory Visit (HOSPITAL_COMMUNITY)
Admission: RE | Admit: 2015-02-23 | Discharge: 2015-02-23 | Disposition: A | Discharge status: Home / Self Care | Payer: Medicare Other | Source: Ambulatory Visit | Attending: Internal Medicine | Admitting: Internal Medicine

## 2015-02-23 ENCOUNTER — Encounter: Payer: Self-pay | Admitting: Internal Medicine

## 2015-02-23 VITALS — BP 92/62 | HR 76 | Wt 172.8 lb

## 2015-02-23 DIAGNOSIS — Z79899 Other long term (current) drug therapy: Secondary | ICD-10-CM | POA: Insufficient documentation

## 2015-02-23 DIAGNOSIS — N183 Chronic kidney disease, stage 3 (moderate): Secondary | ICD-10-CM | POA: Insufficient documentation

## 2015-02-23 DIAGNOSIS — Z950 Presence of cardiac pacemaker: Secondary | ICD-10-CM | POA: Insufficient documentation

## 2015-02-23 DIAGNOSIS — E785 Hyperlipidemia, unspecified: Secondary | ICD-10-CM | POA: Diagnosis not present

## 2015-02-23 DIAGNOSIS — I493 Ventricular premature depolarization: Secondary | ICD-10-CM | POA: Diagnosis not present

## 2015-02-23 DIAGNOSIS — I480 Paroxysmal atrial fibrillation: Secondary | ICD-10-CM | POA: Diagnosis not present

## 2015-02-23 DIAGNOSIS — I255 Ischemic cardiomyopathy: Secondary | ICD-10-CM | POA: Diagnosis not present

## 2015-02-23 DIAGNOSIS — I495 Sick sinus syndrome: Secondary | ICD-10-CM | POA: Diagnosis not present

## 2015-02-23 DIAGNOSIS — Z8249 Family history of ischemic heart disease and other diseases of the circulatory system: Secondary | ICD-10-CM | POA: Diagnosis not present

## 2015-02-23 DIAGNOSIS — Z8546 Personal history of malignant neoplasm of prostate: Secondary | ICD-10-CM | POA: Insufficient documentation

## 2015-02-23 DIAGNOSIS — I5022 Chronic systolic (congestive) heart failure: Secondary | ICD-10-CM | POA: Diagnosis not present

## 2015-02-23 DIAGNOSIS — I129 Hypertensive chronic kidney disease with stage 1 through stage 4 chronic kidney disease, or unspecified chronic kidney disease: Secondary | ICD-10-CM | POA: Diagnosis not present

## 2015-02-23 DIAGNOSIS — Z951 Presence of aortocoronary bypass graft: Secondary | ICD-10-CM | POA: Diagnosis not present

## 2015-02-23 DIAGNOSIS — Z7902 Long term (current) use of antithrombotics/antiplatelets: Secondary | ICD-10-CM | POA: Insufficient documentation

## 2015-02-23 DIAGNOSIS — I2510 Atherosclerotic heart disease of native coronary artery without angina pectoris: Secondary | ICD-10-CM | POA: Insufficient documentation

## 2015-02-23 LAB — COMPREHENSIVE METABOLIC PANEL
ALK PHOS: 42 U/L (ref 38–126)
ALT: 14 U/L — AB (ref 17–63)
AST: 19 U/L (ref 15–41)
Albumin: 3.7 g/dL (ref 3.5–5.0)
Anion gap: 9 (ref 5–15)
BUN: 49 mg/dL — ABNORMAL HIGH (ref 6–20)
CALCIUM: 9.3 mg/dL (ref 8.9–10.3)
CO2: 29 mmol/L (ref 22–32)
CREATININE: 2.29 mg/dL — AB (ref 0.61–1.24)
Chloride: 99 mmol/L — ABNORMAL LOW (ref 101–111)
GFR calc Af Amer: 29 mL/min — ABNORMAL LOW (ref >60)
GFR calc non Af Amer: 25 mL/min — ABNORMAL LOW (ref >60)
Glucose, Bld: 96 mg/dL (ref 65–99)
Potassium: 4.8 mmol/L (ref 3.5–5.1)
SODIUM: 137 mmol/L (ref 135–145)
Total Bilirubin: 0.9 mg/dL (ref 0.3–1.2)
Total Protein: 7.3 g/dL (ref 6.5–8.1)

## 2015-02-23 LAB — TSH: TSH: 1.21 u[IU]/mL (ref 0.350–4.500)

## 2015-02-23 NOTE — Addendum Note (Signed)
Encounter addended by: Harvie Junior, CMA on: 02/23/2015  3:07 PM<BR>     Documentation filed: Dx Association, Patient Instructions Section, Orders

## 2015-02-23 NOTE — Addendum Note (Signed)
Encounter addended by: Patton Salles, RN on: 02/23/2015  3:25 PM<BR>     Documentation filed: Notes Section

## 2015-02-23 NOTE — Patient Instructions (Addendum)
Routine lab work today. (cmet tsh) Will notify you of abnormal results  If your systolic blood pressure is less than 90 or if you experience any dizziness  decrease Coreg to 6.25mg  twice a day.  FOLLOW UP: 6 months

## 2015-02-23 NOTE — Progress Notes (Signed)
CHEST RULER 11  POSTURE standing  ALIGNMENT align with spine  VEST READING 25  COMMENTS

## 2015-02-23 NOTE — Progress Notes (Signed)
Advanced Heart Failure Clinic Note   Patient ID: Marcus [name], male   DOB: 30-Sep-1928, 81 y.o.   MRN: [id number] EP: Dr Lovena Le  HF: Marcus [name] is an 81 yo with history of pacemaker for bradycardia, paroxysmal atrial fibrillation, CAD s/p CABG and PCIs, and ischemic cardiomyopathy EF 20-25%. Underwent CRT-D upgrade 10/08/13 due to 85% RV pacing.     Admitted 5/19-5/26/15 with volume overload. He was diuresed with milrinone and IV lasix and discharge weight was 153 lbs. He underwent upgrade of his ICD to CRT-D as well.  He was able to wean off milrinone and did much better after CRT upgrade.  He was admitted in 3/16 with pacemaker pocket infection.  CRT-D device was explanted and temporary-permanent pacemaker with placed.  He later had a St Jude CRT-P system re-implanted.  He was noted to be back in atrial fibrillation and had TEE-guided DCCV to NSR.  TEE showed EF 25-30%.    He reports today for regular follow up.  At last visit we increase coreg and decreased amio due to fatigue. Weight is stable. Has felt great since we made those changes to his medicine. Doesn't have any DOE with walking on flat ground.  No problem with stairs either. No lightheadedness or dizziness. He is taking torsemide daily, wants to make sure it's not affecting his kidney. No edema. No lightheadedness or syncope. No orthopnea/PND.  No problems with bleeding on eliquis, but does bruise very easily.  Labs (9/14). LDL 77 Labs (4/15): K 3.6, creatinine 1.9 Labs (5/15): K 3.8, creatinine 1.86 Labs (6/15): K 5.3, creatinine 1.86 Potassium stopped  Labs (7/15): K 4.4, creatinine 1.88 Labs (10/15): K 4.0, creatinine 2.18 Labs (3/16): K 4.1 => 4.6, creatinine 1.87 => 1.97, HCT 34.4, TSH normal, LFTs normal.  Labs (6/16): K 4.2, creatinine 2.34  ECG:   PMH:  1. Symptomatic bradycardia: [employer] CRT-D.  He developed pocket infection in 3/16 and had device extracted.  He had St Jude CRT-P system replaced.  2. HTN 3.  Hyperlipidemia: Has refused statins.  4. Atrial fibrillation: History of prior DCCV.  Paroxysmal.  On Eliquis.  Has history of cardioembolism to right leg with right popliteal and tibial embolectomy (had been off Eliquis). Has had nausea with amiodarone in the past but now tolerating.  Recurrence in 3/16, had TEE-guided DCCV.  5. Prostate cancer 6. CAD: CABG remotely with SVG-OM and LIMA-LAD.  Late 1990s had PCI to RCA.  In 12/09 had LHC with total occlusion of SVG-OM, patent LIMA-LAD, DES to LCx and DES to RCA.   7. Ischemic cardiomyopathy: Echo (3/15) with EF 20-25%, wall motion abnormalities noted, mild to moderately decreased RV systolic function. ICD upgraded to CRT-D 10/08/13.  TEE (3/16) with EF 25-30%, mildly dilated RV with mildly decreased systolic function, mild to moderate MR.  8. CKD  SH: Married, nonsmoker, lives in [place]  FH: CAD  ROS: All systems reviewed and negative except as per HPI.   Current Outpatient Prescriptions  Medication Sig Dispense Refill  . amiodarone (PACERONE) 200 MG tablet Take 0.5 tablets (100 mg total) by mouth daily. 30 tablet 3  . Ascorbic Acid (VITAMIN C) 1000 MG tablet Take 1,000 mg by mouth daily.    . carvedilol (COREG) 6.25 MG tablet Take 1.5 tablets (9.375 mg total) by mouth 2 (two) times daily with a meal. 90 tablet 3  . Cholecalciferol (VITAMIN D PO) Take 1 capsule by mouth daily.     . Coenzyme Q10 (CO Q 10)  100 MG CAPS Take 1 capsule by mouth 2 (two) times daily.    Marland Kitchen ELIQUIS 2.5 MG TABS tablet TAKE 1 TABLET TWICE A DAY 60 tablet 3  . isosorbide mononitrate (IMDUR) 30 MG 24 hr tablet Take 0.5 tablets (15 mg total) by mouth daily. 45 tablet 3  . spironolactone (ALDACTONE) 25 MG tablet TAKE ONE-HALF TABLET BY MOUTH ONCE DAILY 45 tablet 3  . torsemide (DEMADEX) 20 MG tablet Take 1 tablet (20 mg total) by mouth daily. 30 tablet 2  . vitamin E 400 UNIT capsule Take 400 Units by mouth daily.    Marland Kitchen CALCIUM PO Take 1 tablet by mouth daily.      No  current facility-administered medications for this encounter.    Filed Vitals:   02/23/15 1352  BP: 92/62  Pulse: 76  Weight: 172 lb 12.8 oz (78.382 kg)  SpO2: 99%   General: NAD, wife present Neck: JVP 6 no thyromegaly or thyroid nodule.  Lungs: CTA CV: Nondisplaced PMI.  Heart regular S1/S2, occasional ectopy 1/6 SEM.   No carotid bruit.  Normal pedal pulses.  Abdomen: Soft, nontender, no hepatosplenomegaly, no distention.  Skin: Intact without lesions or rashes.  Neurologic: Alert and oriented x 3.  Psych: Normal affect. Extremities: No clubbing or cyanosis. Trace ankle edema.  ECG: AV paced 77 with frequent PVCs   Assessment/Plan:  1. Chronic systolic CHF: Ischemic cardiomyopathy.  EF 25-30% on 3/16 TEE.  Has St Jude CRT-P device (placed after pacemaker pocket infection).  NYHA class II symptoms.  He is not significantly volume overloaded on exam. - Continue torsemide 20 mg every day.   - Continue current spironolactone.   - Continue Coreg 9.375 mg bid.  - Can try and start hydralazine/nitrates in future.  No room today with soft BP. - Not on ACEI with CKD. - BMET today.  2. Atrial fibrillation: Paroxysmal.  - Continue amiodarone 100 mg daily. LFTs/TSH check today. Will need yearly eye exam.  He is on Eliquis 2.5 mg bid. 3. CAD: Stable. No chest pain. Continue medical management.  - He is not currently on a statin (he has declined).   - He is not on ASA given use of Eliquis and stable CAD.  4. Sick sinus syndrome: s/p CRT-P. 5. CKD, stage III: Baseline creatinine 1.8-1.9. .   BMET today. LFTs/TSH check today.   Shirley Friar PA-C 02/23/2015  Patient seen and examined with Oda Kilts, PA-C. We discussed all aspects of the encounter. I agree with the assessment and plan as stated above.   Doing very well. NYHA II-III. Volume status looks very good. BP a bit low. If SBP drops below 90 or is symptomatic would cut carvedilol back to 6.25 bid. Will interrogate  ICD today to get PVC count on lower dose amio.   Tatem Fesler,MD 3:01 PM

## 2015-03-01 ENCOUNTER — Encounter: Payer: Self-pay | Admitting: Cardiology

## 2015-03-10 ENCOUNTER — Encounter: Payer: Self-pay | Admitting: Internal Medicine

## 2015-03-15 ENCOUNTER — Encounter: Payer: Self-pay | Admitting: Cardiology

## 2015-03-24 ENCOUNTER — Telehealth: Payer: Self-pay

## 2015-03-24 ENCOUNTER — Ambulatory Visit (INDEPENDENT_AMBULATORY_CARE_PROVIDER_SITE_OTHER): Payer: Medicare Other

## 2015-03-24 DIAGNOSIS — Z950 Presence of cardiac pacemaker: Secondary | ICD-10-CM

## 2015-03-24 DIAGNOSIS — I5023 Acute on chronic systolic (congestive) heart failure: Secondary | ICD-10-CM | POA: Diagnosis not present

## 2015-03-24 NOTE — Progress Notes (Signed)
EPIC Encounter for ICM Monitoring  Patient Name: Marcus [name] is a 81 y.o. male Date: 03/24/2015 Primary Care Physican: No PCP Per Patient Primary Cardiologist: Aundra Dubin Electrophysiologist: Lovena Le Dry Weight: 167 lb    Bi-V Pacing 90%     In the past month, have you:  1. Gained more than 2 pounds in a day or more than 5 pounds in a week? no  2. Had changes in your medications (with verification of current medications)? no  3. Had more shortness of breath than is usual for you? no  4. Limited your activity because of shortness of breath? no  5. Not been able to sleep because of shortness of breath? no  6. Had increased swelling in your feet or ankles? no  7. Had symptoms of dehydration (dizziness, dry mouth, increased thirst, decreased urine output) no  8. Had changes in sodium restriction? no  9. Been compliant with medication? Yes   ICM trend: 03/24/2015   Follow-up plan: ICM clinic phone appointment on 04/26/2015.  Corvue impedance trending along baseline and patient stated he is feeling good.  Denied any HF symptoms.  No changes today.    Copy of note sent to patient's primary care physician, primary cardiologist, and device following physician.  Rosalene Billings, RN, CCM 03/24/2015 3:52 PM

## 2015-03-24 NOTE — Telephone Encounter (Signed)
ICM transmission received.  Attempted patient call and left message for return call.  

## 2015-03-25 NOTE — Telephone Encounter (Signed)
Spoke with patient.

## 2015-04-26 ENCOUNTER — Telehealth: Payer: Self-pay

## 2015-04-26 ENCOUNTER — Ambulatory Visit (INDEPENDENT_AMBULATORY_CARE_PROVIDER_SITE_OTHER): Payer: Medicare Other

## 2015-04-26 ENCOUNTER — Other Ambulatory Visit (HOSPITAL_COMMUNITY): Payer: Self-pay | Admitting: Cardiology

## 2015-04-26 DIAGNOSIS — I5022 Chronic systolic (congestive) heart failure: Secondary | ICD-10-CM | POA: Diagnosis not present

## 2015-04-26 DIAGNOSIS — Z950 Presence of cardiac pacemaker: Secondary | ICD-10-CM

## 2015-04-26 NOTE — Progress Notes (Signed)
EPIC Encounter for ICM Monitoring  Patient Name: Marcus [name] is a 81 y.o. male Date: 04/26/2015 Primary Care Physican: No PCP Per Patient Primary Cardiologist: [facility] Electrophysiologist: Lovena Le Dry Weight: 170 lb       In the past month, have you:  1. Gained more than 2 pounds in a day or more than 5 pounds in a week? no  2. Had changes in your medications (with verification of current medications)? no  3. Had more shortness of breath than is usual for you? no  4. Limited your activity because of shortness of breath? no  5. Not been able to sleep because of shortness of breath? no  6. Had increased swelling in your feet or ankles? no  7. Had symptoms of dehydration (dizziness, dry mouth, increased thirst, decreased urine output) no  8. Had changes in sodium restriction? no  9. Been compliant with medication? Yes   ICM trend:  04/26/2015  ICM Trend 3 month view     Follow-up plan: ICM clinic phone appointment on 05/31/2015.  Corvue daily impedance trending above baseline 04/09/2015 to 04/18/2015 and 04/23/2015 to 04/25/2015 suggesting dryness.  He denied any dehydration symptoms.  Education recommending 64 oz fluid intake daily.  He reported he thinks he does drink enough.  He stated his concern in the last 6 months has been increase in waist line size from 32 in to 36 in and his weight is fluctuating between 170 lbs to 172 lbs and normally it would stay around 166 to 167 lbs.  He denied any HF symptoms.  He has discussed his concern regarding increase in waist line and weight with Dr Haroldine Laws.  Patient does not have a PCP and does not plan on getting one.  Advised a PCP would be able to treat other medical conditions should they arise and encouraged to obtain PCP.  Advised he may want to have GI MD to evaluate his increase in abdominal size.  No changes today.     Copy of note sent to patient's primary care physician, primary cardiologist, and device following  physician.  Rosalene Billings, RN, CCM 04/26/2015 11:44 AM

## 2015-04-26 NOTE — Telephone Encounter (Signed)
ICM transmission received.  Attempted patient call and left message for return call.  

## 2015-04-26 NOTE — Telephone Encounter (Signed)
Spoke with patient.

## 2015-05-22 DIAGNOSIS — I82409 Acute embolism and thrombosis of unspecified deep veins of unspecified lower extremity: Secondary | ICD-10-CM

## 2015-05-22 HISTORY — DX: Acute embolism and thrombosis of unspecified deep veins of unspecified lower extremity: I82.409

## 2015-05-31 ENCOUNTER — Ambulatory Visit (INDEPENDENT_AMBULATORY_CARE_PROVIDER_SITE_OTHER): Payer: Medicare Other | Admitting: *Deleted

## 2015-05-31 DIAGNOSIS — I5022 Chronic systolic (congestive) heart failure: Secondary | ICD-10-CM | POA: Diagnosis not present

## 2015-05-31 DIAGNOSIS — Z950 Presence of cardiac pacemaker: Secondary | ICD-10-CM | POA: Diagnosis not present

## 2015-05-31 DIAGNOSIS — I495 Sick sinus syndrome: Secondary | ICD-10-CM

## 2015-06-01 NOTE — Progress Notes (Signed)
Remote pacemaker transmission.   

## 2015-06-02 ENCOUNTER — Telehealth: Payer: Self-pay

## 2015-06-02 NOTE — Telephone Encounter (Signed)
Spoke with patient.

## 2015-06-02 NOTE — Telephone Encounter (Signed)
ICM transmission received.  Attempted call to patient and left message for return call. 

## 2015-06-02 NOTE — Addendum Note (Signed)
Addended by: Rosalene Billings on: 06/02/2015 04:07 PM   Modules accepted: Medications

## 2015-06-02 NOTE — Progress Notes (Addendum)
EPIC Encounter for ICM Monitoring  Patient Name: Marcus [name] is a 82 y.o. male Date: 06/02/2015 Primary Care Physican: No PCP Per Patient Primary Cardiologist: [facility] Electrophysiologist: Lovena Le Dry Weight: 169 lbs  Bi-V Pacing 92%       In the past month, have you:  1. Gained more than 2 pounds in a day or more than 5 pounds in a week? no  2. Had changes in your medications (with verification of current medications)? no  3. Had more shortness of breath than is usual for you? no  4. Limited your activity because of shortness of breath? no  5. Not been able to sleep because of shortness of breath? no  6. Had increased swelling in your feet or ankles? no  7. Had symptoms of dehydration (dizziness, dry mouth, increased thirst, decreased urine output) no  8. Had changes in sodium restriction? no  9. Been compliant with medication? No, he is taking Torsemide 20 mg every other day instead of prescribed way of daily.     ICM trend: 3 month view 05/31/2015  ICM trend: 1 year view 05/31/2015   Follow-up plan: ICM clinic phone appointment 07/06/2015.  Corvue daily impedance above reference line 3 episodes in 05/2015 suggesting dryness.  He stated he has been doing well.  He denied any HF symptoms and encouraged to call should he experience any.  No changes today.    Copy of note sent to patient's primary care physician, primary cardiologist, and device following physician.  Rosalene Billings, RN, CCM 06/02/2015 3:59 PM

## 2015-06-15 ENCOUNTER — Other Ambulatory Visit (HOSPITAL_COMMUNITY): Payer: Self-pay | Admitting: *Deleted

## 2015-06-15 MED ORDER — APIXABAN 2.5 MG PO TABS: 2.5000 mg | ORAL_TABLET | Freq: Two times a day (BID) | ORAL | Status: DC

## 2015-06-20 LAB — CUP PACEART REMOTE DEVICE CHECK
Battery Remaining Longevity: 72 mo
Battery Remaining Percentage: 95.5 %
Battery Voltage: 2.99 V
Brady Statistic AS VP Percent: 1 %
Brady Statistic RA Percent Paced: 98 %
Implantable Lead Implant Date: 20160304
Implantable Lead Location: 753858
Implantable Lead Location: 753859
Implantable Lead Location: 753860
Lead Channel Impedance Value: 460 Ohm
Lead Channel Impedance Value: 540 Ohm
Lead Channel Impedance Value: 830 Ohm
Lead Channel Pacing Threshold Amplitude: 1.25 V
Lead Channel Pacing Threshold Pulse Width: 0.5 ms
Lead Channel Pacing Threshold Pulse Width: 0.6 ms
Lead Channel Pacing Threshold Pulse Width: 0.8 ms
Lead Channel Setting Pacing Amplitude: 2.5 V
Lead Channel Setting Pacing Amplitude: 2.5 V
Lead Channel Setting Pacing Amplitude: 2.5 V
Lead Channel Setting Pacing Pulse Width: 0.5 ms
Lead Channel Setting Pacing Pulse Width: 0.6 ms
Lead Channel Setting Sensing Sensitivity: 5 mV
MDC IDC LEAD IMPLANT DT: 20160304
MDC IDC LEAD IMPLANT DT: 20160304
MDC IDC MSMT LEADCHNL RA PACING THRESHOLD AMPLITUDE: 1.25 V
MDC IDC MSMT LEADCHNL RA SENSING INTR AMPL: 2.1 mV
MDC IDC MSMT LEADCHNL RV PACING THRESHOLD AMPLITUDE: 1 V
MDC IDC MSMT LEADCHNL RV SENSING INTR AMPL: 12 mV
MDC IDC PG MODEL: 3242
MDC IDC SESS DTM: 20170110070033
MDC IDC STAT BRADY AP VP PERCENT: 92 %
MDC IDC STAT BRADY AP VS PERCENT: 7.6 %
MDC IDC STAT BRADY AS VS PERCENT: 1 %
Pulse Gen Serial Number: 7725037

## 2015-06-22 ENCOUNTER — Encounter: Payer: Self-pay | Admitting: Cardiology

## 2015-07-06 ENCOUNTER — Ambulatory Visit (INDEPENDENT_AMBULATORY_CARE_PROVIDER_SITE_OTHER): Payer: Medicare Other

## 2015-07-06 DIAGNOSIS — I5022 Chronic systolic (congestive) heart failure: Secondary | ICD-10-CM | POA: Diagnosis not present

## 2015-07-06 DIAGNOSIS — Z950 Presence of cardiac pacemaker: Secondary | ICD-10-CM | POA: Diagnosis not present

## 2015-07-08 NOTE — Progress Notes (Signed)
EPIC Encounter for ICM Monitoring  Patient Name: Marcus [name] is a 82 y.o. male Date: 07/08/2015 Primary Care Physican: No PCP Per Patient Primary Cardiologist: [facility] Electrophysiologist: Lovena Le Dry Weight: 166 lbs       Bi-V Pacing 93%  In the past month, have you:  1. Gained more than 2 pounds in a day or more than 5 pounds in a week? no  2. Had changes in your medications (with verification of current medications)? no  3. Had more shortness of breath than is usual for you? no  4. Limited your activity because of shortness of breath? no  5. Not been able to sleep because of shortness of breath? no  6. Had increased swelling in your feet or ankles? no  7. Had symptoms of dehydration (dizziness, dry mouth, increased thirst, decreased urine output) no  8. Had changes in sodium restriction? no  9. Been compliant with medication? Yes   ICM trend: 3 month view for 07/06/2015   ICM trend: 1 year view for 07/06/2015    Direct Trend Viewer   Follow-up plan: ICM clinic phone appointment on 09/19/2015 and in office visit with Dr Lovena Le on 08/19/2015.  CorVue thoracic impedance slightly below reference line 07/01/2013 to 07/06/2015 suggesting fluid accumulation.  Thoracic impedance above reference line 07/07/2015.   He denied any fluid symptoms and is feeling good.  Confirms med compliance.  Encouraged to call if he has any fluid symptoms.  No changes today   Copy of note sent to patient's primary care physician, primary cardiologist, and device following physician.  Rosalene Billings, RN, CCM 07/08/2015 9:06 AM

## 2015-07-18 DIAGNOSIS — H353132 Nonexudative age-related macular degeneration, bilateral, intermediate dry stage: Secondary | ICD-10-CM | POA: Diagnosis not present

## 2015-07-22 ENCOUNTER — Other Ambulatory Visit (HOSPITAL_COMMUNITY): Payer: Self-pay | Admitting: Internal Medicine

## 2015-08-19 ENCOUNTER — Ambulatory Visit (INDEPENDENT_AMBULATORY_CARE_PROVIDER_SITE_OTHER): Payer: Medicare Other | Admitting: Internal Medicine

## 2015-08-19 ENCOUNTER — Encounter: Payer: Self-pay | Admitting: Internal Medicine

## 2015-08-19 VITALS — BP 110/60 | HR 70 | Ht 68.0 in | Wt 175.0 lb

## 2015-08-19 DIAGNOSIS — I442 Atrioventricular block, complete: Secondary | ICD-10-CM | POA: Diagnosis not present

## 2015-08-19 DIAGNOSIS — I455 Other specified heart block: Secondary | ICD-10-CM

## 2015-08-19 NOTE — Progress Notes (Signed)
HPI [name] returns today for followup. He is a pleasant 82 yo man with chb, and chronic systolic heart failure who underwent BiV PM insertion years ago and then developed erosion of his pocket and underwent extraction approx. 12 months ago followed by re-insertion of a biv pm on the contralateral side. He has done well in the interim with no infectious symptoms. He denies chest pain or sob. He remains active working outside. He exercises regularly. He had no specific complaints. No Known Allergies   Current Outpatient Prescriptions  Medication Sig Dispense Refill  . amiodarone (PACERONE) 200 MG tablet Take 0.5 tablets (100 mg total) by mouth daily. 30 tablet 3  . apixaban (ELIQUIS) 2.5 MG TABS tablet Take 1 tablet (2.5 mg total) by mouth 2 (two) times daily. 60 tablet 3  . Ascorbic Acid (VITAMIN C) 1000 MG tablet Take 1,000 mg by mouth daily.    Marland Kitchen CALCIUM PO Take 1 tablet by mouth daily.     . carvedilol (COREG) 6.25 MG tablet Take 1.5 tablets (9.375 mg total) by mouth 2 (two) times daily with a meal. 90 tablet 3  . carvedilol (COREG) 6.25 MG tablet Take 1.5 tablets (9.375 mg total) by mouth 2 (two) times daily with a meal. 270 tablet 3  . Cholecalciferol (VITAMIN D PO) Take 1 capsule by mouth daily.     . Coenzyme Q10 (CO Q 10) 100 MG CAPS Take 1 capsule by mouth 2 (two) times daily.    . isosorbide mononitrate (IMDUR) 30 MG 24 hr tablet Take 0.5 tablets (15 mg total) by mouth daily. 45 tablet 3  . spironolactone (ALDACTONE) 25 MG tablet TAKE ONE-HALF TABLET BY MOUTH ONCE DAILY 45 tablet 3  . torsemide (DEMADEX) 20 MG tablet Take 20 mg by mouth every other day.    . vitamin E 400 UNIT capsule Take 400 Units by mouth daily.     No current facility-administered medications for this visit.     Past Medical History  Diagnosis Date  . Coronary artery disease     a. s/p remote CABG x 2(VG->OM, LIMA->LAD;  b. Late 90's s/p PCI of RCA;  c. 12/09 Cath/PCI: LM 61m, 70-80d (3.0x75mm  Xience DES), LAD100p, LCX 80-90p (2.25x79mmTaxus Atom DES), RCA 100d (2.5x52mm Xience DES), VG->OM 100, LIMA->LAD nl, EF 30-35%.  . Hyperlipidemia   . Prostate cancer   . PAF (paroxysmal atrial fibrillation)   . Ischemic cardiomyopathy     a. EF 35-40% by echo 05/2012, b. EF 20-25%, akinesis and scarring of inferolateral, inferior and inferoseptal myocardium, mild AI, mod MR, LA mod dilated, RV mildly dialted and sys fx mildly reduced, RA mildly dilated (09/2013)  c. EF 35-40% by 2D in 07/2014, 25-30% by TEE.  . Symptomatic bradycardia     a. 08/2003 s/p MDT Enpulse E2DR01 Dual chamber PPM ser # [id number] H. b. Device explant due to infection/temp perm insertion/PPM 07/2014.  Marland Kitchen Peripheral vascular disease   . Chronic systolic CHF (congestive heart failure)     a. s/p CRT-D 09/2013. b. Device explant with temp perm then subsequent CRT-P 07/2014.  Marland Kitchen Hard of hearing   . Hypertension   . History of gout   . History of gastric ulcer   . History of colon polyps   . Cataracts, bilateral   . Macular degeneration, dry   . History of shingles   . CKD (chronic kidney disease), stage III   . Atrial flutter     a. Dx 07/2014,  s/p TEE/DCCV.  Marland Kitchen Pacemaker infection     ROS:   All systems reviewed and negative except as noted in the HPI.   Past Surgical History  Procedure Laterality Date  . Doppler echocardiography  06/24/2002    EF 60-65%  . Tee without cardioversion  06/26/2012    Procedure: TRANSESOPHAGEAL ECHOCARDIOGRAM (TEE);  Surgeon: Lelon Perla, MD;  Location: [facility] ENDOSCOPY;  Service: Cardiovascular;  Laterality: [name];  . Cardioversion  06/26/2012    Procedure: CARDIOVERSION;  Surgeon: Lelon Perla, MD;  Location: [place];  Service: Cardiovascular;  Laterality: [name];  . Appendectomy    . Angioplasty      stent placement  . Embolectomy Right 07/28/2013    Procedure: Right Popliteal and Tibial Embolectomy;  Surgeon: Angelia Mould, MD;  Location: [place];  Service: Vascular;   Laterality: Right;  . Permanent pacemaker generator change [name] 08/27/2012    Procedure: PERMANENT PACEMAKER GENERATOR CHANGE;  Surgeon: [name], MD;  Location: [facility] CATH LAB;  Service: Cardiovascular;  Laterality: [name];  . Bi-ventricular pacemaker upgrade [name] 10/08/2013    Procedure: BI-VENTRICULAR PACEMAKER UPGRADE;  Surgeon: [name], MD;  Location: [facility] CATH LAB;  Service: Cardiovascular;  Laterality: [name];  . Cardiac catheterization  most recent in 2009    several  . Coronary artery bypass graft  1990    left main  . Insert / replace / remove pacemaker      1st pacemaker placed 10+yrs ago  . Colonoscopy    . Coronary angioplasty      couple   . Pacemaker lead removal Right 07/19/2014    Procedure: PACEMAKER LEAD REMOVAL/EXTRACTION;  Surgeon: [name], MD;  Location: [place];  Service: Cardiovascular;  Laterality: Right;  DR. Lucianne Lei TRIGT TO BACK UP CASE  . Cardiac catheterization Right 07/19/2014    Procedure: TEMPORARY PACEMAKER;  Surgeon: [name], MD;  Location: [place];  Service: Cardiovascular;  Laterality: Right;  . Bi-ventricular pacemaker insertion [name] 07/23/2014    Procedure: BI-VENTRICULAR PACEMAKER INSERTION (CRT-P);  Surgeon: Deboraha Sprang, MD;  Location: [facility] CATH LAB;  Service: Cardiovascular;  Laterality: [name];  . Tee without cardioversion [name] 07/27/2014    Procedure: TRANSESOPHAGEAL ECHOCARDIOGRAM (TEE);  Surgeon: [name], MD;  Location: [place];  Service: Cardiovascular;  Laterality: [name];  . Cardioversion [name] 07/27/2014    Procedure: CARDIOVERSION;  Surgeon: [name], MD;  Location: [facility] ENDOSCOPY;  Service: Cardiovascular;  Laterality: [name];     Family History  Problem Relation Age of Onset  . Hypertension Mother   . Colon cancer Mother   . Cancer Mother     Colon  . Alcohol abuse Father      Social History   Social History  . Marital Status: Married    Spouse Name: [name]  . Number of Children: 0  . Years of Education: [name]    Occupational History  . retired    Social History Main Topics  . Smoking status: Former Smoker    Quit date: 02/10/1971  . Smokeless tobacco: Never Used     Comment: quit smoking 13yrs ago  . Alcohol Use: No     Comment: nothing in 51yrs   . Drug Use: No  . Sexual Activity: No   Other Topics Concern  . Not on file   Social History Narrative   Lives in [place] with wife.  Retired.     BP 110/60 mmHg  Pulse 70  Ht 5\' 8"  (1.727  m)  Wt 175 lb (79.379 kg)  BMI 26.61 kg/m2  Physical Exam:  Well appearing 82 yo man, NAD HEENT: Unremarkable Neck:  6 cm JVD, no thyromegally Back:  No CVA tenderness Lungs:  Clear with no wheezes HEART:  Regular rate rhythm, no murmurs, no rubs, no clicks Abd:  soft, positive bowel sounds, no organomegally, no rebound, no guarding Ext:  2 plus pulses, no edema, no cyanosis, no clubbing Skin:  No rashes no nodules Neuro:  CN II through XII intact, motor grossly intact  ECG - nsr with BiV pacing  DEVICE  Normal device function.  See PaceArt for details.   Assess/Plan: 1. Complete heart block - he is stable, s/p PPM insertion 2. PPM - his medtronic CRT-P Biv PPM is working normally. Will recheck in several months. 3. HTN - His blood pressure is well controlled. Will follow.  Mikle Bosworth.D.

## 2015-08-19 NOTE — Patient Instructions (Addendum)
    Medication Instructions:  Your physician recommends that you continue on your current medications as directed. Please refer to the Current Medication list given to you today.   Labwork: None ordered   Testing/Procedures: None ordered   Follow-Up: Your physician wants you to follow-up in: 12 months with Dr Knox Saliva will receive a reminder letter in the mail two months in advance. If you don't receive a letter, please call our office to schedule the follow-up appointment.  Remote monitoring is used to monitor your ICD from home. This monitoring reduces the number of office visits required to check your device to one time per year. It allows [place] to keep an eye on the functioning of your device to ensure it is working properly. You are scheduled for a device check from home on 11/21/15. You may send your transmission at any time that day. If you have a wireless device, the transmission will be sent automatically. After your physician reviews your transmission, you will receive a postcard with your next transmission date.     Any Other Special Instructions Will Be Listed Below (If Applicable).     If you need a refill on your cardiac medications before your next appointment, please call your pharmacy.

## 2015-09-12 ENCOUNTER — Other Ambulatory Visit (HOSPITAL_COMMUNITY): Payer: Self-pay | Admitting: *Deleted

## 2015-09-12 DIAGNOSIS — I480 Paroxysmal atrial fibrillation: Secondary | ICD-10-CM

## 2015-09-12 DIAGNOSIS — I5023 Acute on chronic systolic (congestive) heart failure: Secondary | ICD-10-CM

## 2015-09-12 MED ORDER — AMIODARONE HCL 200 MG PO TABS: 100.0000 mg | ORAL_TABLET | Freq: Every day | ORAL | Status: DC

## 2015-09-14 ENCOUNTER — Other Ambulatory Visit (HOSPITAL_COMMUNITY): Payer: Self-pay | Admitting: *Deleted

## 2015-09-14 DIAGNOSIS — I5023 Acute on chronic systolic (congestive) heart failure: Secondary | ICD-10-CM

## 2015-09-14 DIAGNOSIS — I480 Paroxysmal atrial fibrillation: Secondary | ICD-10-CM

## 2015-09-14 MED ORDER — AMIODARONE HCL 200 MG PO TABS: 100.0000 mg | ORAL_TABLET | Freq: Every day | ORAL | Status: DC

## 2015-09-19 ENCOUNTER — Telehealth: Payer: Self-pay

## 2015-09-19 ENCOUNTER — Ambulatory Visit (INDEPENDENT_AMBULATORY_CARE_PROVIDER_SITE_OTHER): Payer: Medicare Other

## 2015-09-19 DIAGNOSIS — I5022 Chronic systolic (congestive) heart failure: Secondary | ICD-10-CM | POA: Diagnosis not present

## 2015-09-19 DIAGNOSIS — Z950 Presence of cardiac pacemaker: Secondary | ICD-10-CM

## 2015-09-19 NOTE — Progress Notes (Signed)
EPIC Encounter for ICM Monitoring  Patient Name: Marcus [name] is a 82 y.o. male Date: 09/19/2015 Primary Care Physican: No PCP Per Patient Primary Cardiologist: [place] [facility] Electrophysiologist: Lovena Le Dry Weight: unkown   Bi-V Pacing 94%      In the past month, have you:  1. Gained more than 2 pounds in a day or more than 5 pounds in a week? [name]  2. Had changes in your medications (with verification of current medications)? [name]  3. Had more shortness of breath than is usual for you? [name]  4. Limited your activity because of shortness of breath? [name]  5. Not been able to sleep because of shortness of breath? [name]  6. Had increased swelling in your feet or ankles? [name]  7. Had symptoms of dehydration (dizziness, dry mouth, increased thirst, decreased urine output) [name]  8. Had changes in sodium restriction? [name]  9. Been compliant with medication? [name]  ICM trend: 3 month view for 09/19/2015    ICM trend: 1 year view for 09/19/2015    Follow-up plan: ICM clinic phone appointment 10/20/2015.  Attempted call to patient and unable to reach.  Transmission reviewed.   FLUID LEVELS: [place] thoracic impedance trending along reference line suggesting fluid levels are stable.     No changes, unable to reach.     Rosalene Billings, RN, CCM 09/19/2015 3:16 PM

## 2015-09-19 NOTE — Telephone Encounter (Signed)
Remote ICM transmission received.  Attempted patient call and left message for return call.   

## 2015-10-20 ENCOUNTER — Ambulatory Visit (INDEPENDENT_AMBULATORY_CARE_PROVIDER_SITE_OTHER): Payer: Medicare Other

## 2015-10-20 DIAGNOSIS — I5022 Chronic systolic (congestive) heart failure: Secondary | ICD-10-CM | POA: Diagnosis not present

## 2015-10-20 DIAGNOSIS — Z950 Presence of cardiac pacemaker: Secondary | ICD-10-CM

## 2015-10-21 ENCOUNTER — Telehealth: Payer: Self-pay

## 2015-10-21 NOTE — Progress Notes (Signed)
EPIC Encounter for ICM Monitoring  Patient Name: Marcus [name] is a 82 y.o. male Date: 10/21/2015 Primary Care Physican: No PCP Per Patient Primary Cardiologist: [place] Electrophysiologist: Lovena Le Dry Weight: unknown   Bi-V Pacing 95%      In the past month, have you:  1. Gained more than 2 pounds in a day or more than 5 pounds in a week? [name]  2. Had changes in your medications (with verification of current medications)? [name]  3. Had more shortness of breath than is usual for you? [name]  4. Limited your activity because of shortness of breath? [name]  5. Not been able to sleep because of shortness of breath? [name]  6. Had increased swelling in your feet or ankles? [name]  7. Had symptoms of dehydration (dizziness, dry mouth, increased thirst, decreased urine output) [name]  8. Had changes in sodium restriction? [name]  9. Been compliant with medication? [name]   ICM trend: 3 month view for 10/21/2015   ICM trend: 1 year view for 10/21/2015   Follow-up plan: ICM clinic phone appointment on 11/21/2015.  Attempted call to patient and unable to reach.  Transmission reviewed.  Thoracic impedance below reference line from 09/23/2015 to 10/04/2015 suggesting fluid accumulation and returned to reference line 10/04/2015 suggesting stable fluid levels.     Rosalene Billings, RN, CCM 10/21/2015 12:26 PM

## 2015-10-21 NOTE — Telephone Encounter (Signed)
Remote ICM transmission received.  Attempted patient call and left message for return call.   

## 2015-10-24 ENCOUNTER — Other Ambulatory Visit (HOSPITAL_COMMUNITY): Payer: Self-pay | Admitting: Internal Medicine

## 2015-10-24 NOTE — Telephone Encounter (Signed)
Received voice mail from wife and attempted to return call to patient today.  No answer.

## 2015-10-29 ENCOUNTER — Encounter (HOSPITAL_COMMUNITY): Payer: Self-pay | Admitting: Emergency Medicine

## 2015-10-29 ENCOUNTER — Emergency Department (HOSPITAL_COMMUNITY): Payer: Medicare Other

## 2015-10-29 DIAGNOSIS — I5022 Chronic systolic (congestive) heart failure: Secondary | ICD-10-CM | POA: Insufficient documentation

## 2015-10-29 DIAGNOSIS — Z79899 Other long term (current) drug therapy: Secondary | ICD-10-CM | POA: Diagnosis not present

## 2015-10-29 DIAGNOSIS — Z950 Presence of cardiac pacemaker: Secondary | ICD-10-CM | POA: Insufficient documentation

## 2015-10-29 DIAGNOSIS — Z87891 Personal history of nicotine dependence: Secondary | ICD-10-CM | POA: Diagnosis not present

## 2015-10-29 DIAGNOSIS — I130 Hypertensive heart and chronic kidney disease with heart failure and stage 1 through stage 4 chronic kidney disease, or unspecified chronic kidney disease: Secondary | ICD-10-CM | POA: Diagnosis not present

## 2015-10-29 DIAGNOSIS — I2510 Atherosclerotic heart disease of native coronary artery without angina pectoris: Secondary | ICD-10-CM | POA: Diagnosis not present

## 2015-10-29 DIAGNOSIS — Z7901 Long term (current) use of anticoagulants: Secondary | ICD-10-CM | POA: Insufficient documentation

## 2015-10-29 DIAGNOSIS — N183 Chronic kidney disease, stage 3 (moderate): Secondary | ICD-10-CM | POA: Insufficient documentation

## 2015-10-29 DIAGNOSIS — Z8546 Personal history of malignant neoplasm of prostate: Secondary | ICD-10-CM | POA: Diagnosis not present

## 2015-10-29 DIAGNOSIS — M7989 Other specified soft tissue disorders: Secondary | ICD-10-CM | POA: Insufficient documentation

## 2015-10-29 DIAGNOSIS — M2548 Effusion, other site: Secondary | ICD-10-CM | POA: Diagnosis not present

## 2015-10-29 DIAGNOSIS — Z951 Presence of aortocoronary bypass graft: Secondary | ICD-10-CM | POA: Diagnosis not present

## 2015-10-29 NOTE — ED Notes (Signed)
Pt states he started having pain and bruising to R ring finger. Denies injury. Pt states he takes eliquis for previous blood clots in his legs. Pt denies CP or SOB. Painful only with movement. Caprefill on finger <3 seconds, sensation intact.

## 2015-10-30 ENCOUNTER — Emergency Department (HOSPITAL_COMMUNITY)
Admission: EM | Admit: 2015-10-30 | Discharge: 2015-10-30 | Disposition: A | Discharge status: Home / Self Care | Payer: Medicare Other | Attending: Emergency Medicine | Admitting: Emergency Medicine

## 2015-10-30 DIAGNOSIS — M25441 Effusion, right hand: Secondary | ICD-10-CM

## 2015-10-30 NOTE — ED Provider Notes (Signed)
CSN: [id number]     Arrival date & time 10/29/15  2106 History   First MD Initiated Contact with Patient 10/30/15 0148     Chief Complaint  Patient presents with  . Hand Pain     (Consider location/radiation/quality/duration/timing/severity/associated sxs/prior Treatment) The history is provided by the patient and medical records. No language interpreter was used.     Marcus [name] is a 82 y.o. male  with a hx of Coronary artery disease anticoagulated on Eliquis, A. fib, cardiomyopathy, internal defibrillator presents to the Emergency Department complaining of gradual, persistent, progressively worsening swelling to the PIP of the right fourth finger which began yesterday.  He denies any known trauma or injury. He states he is concerned about a blood clot.  He reports pain with range of motion of the joint but denies significant decrease in range of motion. Patient is right handed. He denies fever, chills, nausea, vomiting. He denies insect bites to the area. No treatments prior to arrival. No aggravating or alleviating factors.   Past Medical History  Diagnosis Date  . Coronary artery disease     a. s/p remote CABG x 2(VG->OM, LIMA->LAD;  b. Late 90's s/p PCI of RCA;  c. 12/09 Cath/PCI: LM 35m, 70-80d (3.0x50mm Xience DES), LAD100p, LCX 80-90p (2.25x74mmTaxus Atom DES), RCA 100d (2.5x70mm Xience DES), VG->OM 100, LIMA->LAD nl, EF 30-35%.  . Hyperlipidemia   . Prostate cancer ([place])   . PAF (paroxysmal atrial fibrillation) ([place])   . Ischemic cardiomyopathy     a. EF 35-40% by echo 05/2012, b. EF 20-25%, akinesis and scarring of inferolateral, inferior and inferoseptal myocardium, mild AI, mod MR, LA mod dilated, RV mildly dialted and sys fx mildly reduced, RA mildly dilated (09/2013)  c. EF 35-40% by 2D in 07/2014, 25-30% by TEE.  . Symptomatic bradycardia     a. 08/2003 s/p MDT Enpulse E2DR01 Dual chamber PPM ser # [id number] H. b. Device explant due to infection/temp perm insertion/PPM  07/2014.  Marland Kitchen Peripheral vascular disease ([place])   . Chronic systolic CHF (congestive heart failure) ([place])     a. s/p CRT-D 09/2013. b. Device explant with temp perm then subsequent CRT-P 07/2014.  Marland Kitchen Hard of hearing   . Hypertension   . History of gout   . History of gastric ulcer   . History of colon polyps   . Cataracts, bilateral   . Macular degeneration, dry   . History of shingles   . CKD (chronic kidney disease), stage III   . Atrial flutter ([place])     a. Dx 07/2014, s/p TEE/DCCV.  Marland Kitchen Pacemaker infection [facility])    Past Surgical History  Procedure Laterality Date  . Doppler echocardiography  06/24/2002    EF 60-65%  . Tee without cardioversion  06/26/2012    Procedure: TRANSESOPHAGEAL ECHOCARDIOGRAM (TEE);  Surgeon: Lelon Perla, MD;  Location: [facility] ENDOSCOPY;  Service: Cardiovascular;  Laterality: [name];  . Cardioversion  06/26/2012    Procedure: CARDIOVERSION;  Surgeon: Lelon Perla, MD;  Location: [place];  Service: Cardiovascular;  Laterality: [name];  . Appendectomy    . Angioplasty      stent placement  . Embolectomy Right 07/28/2013    Procedure: Right Popliteal and Tibial Embolectomy;  Surgeon: Angelia Mould, MD;  Location: [place];  Service: Vascular;  Laterality: Right;  . Permanent pacemaker generator change [name] 08/27/2012    Procedure: PERMANENT PACEMAKER GENERATOR CHANGE;  Surgeon: [name], MD;  Location: [facility] CATH LAB;  Service: Cardiovascular;  Laterality: [name];  . Bi-ventricular pacemaker upgrade [name] 10/08/2013    Procedure: BI-VENTRICULAR PACEMAKER UPGRADE;  Surgeon: [name], MD;  Location: [facility] CATH LAB;  Service: Cardiovascular;  Laterality: [name];  . Cardiac catheterization  most recent in 2009    several  . Coronary artery bypass graft  1990    left main  . Insert / replace / remove pacemaker      1st pacemaker placed 10+yrs ago  . Colonoscopy    . Coronary angioplasty      couple   . Pacemaker lead removal Right 07/19/2014    Procedure: PACEMAKER  LEAD REMOVAL/EXTRACTION;  Surgeon: [name], MD;  Location: [place];  Service: Cardiovascular;  Laterality: Right;  DR. Lucianne Lei TRIGT TO BACK UP CASE  . Cardiac catheterization Right 07/19/2014    Procedure: TEMPORARY PACEMAKER;  Surgeon: [name], MD;  Location: [place];  Service: Cardiovascular;  Laterality: Right;  . Bi-ventricular pacemaker insertion [name] 07/23/2014    Procedure: BI-VENTRICULAR PACEMAKER INSERTION (CRT-P);  Surgeon: Deboraha Sprang, MD;  Location: [facility] CATH LAB;  Service: Cardiovascular;  Laterality: [name];  . Tee without cardioversion [name] 07/27/2014    Procedure: TRANSESOPHAGEAL ECHOCARDIOGRAM (TEE);  Surgeon: [name], MD;  Location: [place];  Service: Cardiovascular;  Laterality: [name];  . Cardioversion [name] 07/27/2014    Procedure: CARDIOVERSION;  Surgeon: [name], MD;  Location: [facility] ENDOSCOPY;  Service: Cardiovascular;  Laterality: [name];   Family History  Problem Relation Age of Onset  . Hypertension Mother   . Colon cancer Mother   . Cancer Mother     Colon  . Alcohol abuse Father    Social History  Substance Use Topics  . Smoking status: Former Smoker    Quit date: 02/10/1971  . Smokeless tobacco: Never Used     Comment: quit smoking 67yrs ago  . Alcohol Use: No     Comment: nothing in 24yrs     Review of Systems  Constitutional: Negative for fever and chills.  Gastrointestinal: Negative for nausea and vomiting.  Musculoskeletal: Positive for joint swelling and arthralgias. Negative for back pain, neck pain and neck stiffness.  Skin: Negative for wound.  Neurological: Negative for numbness.  Hematological: Does not bruise/bleed easily.  Psychiatric/Behavioral: The patient is not nervous/anxious.   All other systems reviewed and are negative.     Allergies  Review of patient's allergies indicates no known allergies.  Home Medications   Prior to Admission medications   Medication Sig Start Date End Date Taking? Authorizing Provider   amiodarone (PACERONE) 200 MG tablet Take 0.5 tablets (100 mg total) by mouth daily. 09/14/15   [name], MD  Ascorbic Acid (VITAMIN C) 1000 MG tablet Take 1,000 mg by mouth daily.    Historical Provider, MD  CALCIUM PO Take 1 tablet by mouth daily.     Historical Provider, MD  carvedilol (COREG) 6.25 MG tablet Take 1.5 tablets (9.375 mg total) by mouth 2 (two) times daily with a meal. 09/08/14   [name], MD  carvedilol (COREG) 6.25 MG tablet Take 1.5 tablets (9.375 mg total) by mouth 2 (two) times daily with a meal. 07/22/15   Jolaine Artist, MD  Cholecalciferol (VITAMIN D PO) Take 1 capsule by mouth daily.     Historical Provider, MD  Coenzyme Q10 (CO Q 10) 100 MG CAPS Take 1 capsule by mouth 2 (two) times daily.    Historical Provider, MD  ELIQUIS 2.5 MG TABS tablet TAKE  1 TABLET TWICE A DAY 10/24/15   Jolaine Artist, MD  isosorbide mononitrate (IMDUR) 30 MG 24 hr tablet Take 0.5 tablets (15 mg total) by mouth daily. 01/25/15   [name], MD  spironolactone (ALDACTONE) 25 MG tablet TAKE ONE-HALF TABLET BY MOUTH ONCE DAILY 01/25/15   [name], MD  torsemide (DEMADEX) 20 MG tablet Take 20 mg by mouth every other day.    Historical Provider, MD  vitamin E 400 UNIT capsule Take 400 Units by mouth daily.    Historical Provider, MD   BP 102/64 mmHg  Pulse 70  Temp(Src) 98.3 F (36.8 C) (Oral)  Resp 18  SpO2 97% Physical Exam  Constitutional: He appears well-developed and well-nourished. No distress.  HENT:  Head: Normocephalic and atraumatic.  Eyes: Conjunctivae are normal.  Neck: Normal range of motion.  Cardiovascular: Normal rate, regular rhythm and intact distal pulses.   Capillary refill < 3 sec  Pulmonary/Chest: Effort normal and breath sounds normal.  Musculoskeletal: He exhibits tenderness. He exhibits no edema.  Right ring finger: Mild swelling, erythema and ecchymosis over the PIP; no increased warmth or induration; no open wound Full range of motion of  all fingers of the right hand including the PIP of the right ring finger though this does cause some pain.  Neurological: He is alert. Coordination normal.  Sensation intact to normal touch in the right hand Strength 5/5 in the right hand including grip strength  Skin: Skin is warm and dry. He is not diaphoretic.  No tenting of the skin  Psychiatric: He has a normal mood and affect.  Nursing note and vitals reviewed.   ED Course  Procedures (including critical care time)  Imaging Review Dg Finger Ring Right  10/29/2015  CLINICAL DATA:  82 year old male with swelling of the Find fourth digit. EXAM: RIGHT RING FINGER 2+V COMPARISON:  None. FINDINGS: There is no acute fracture or dislocation. The bones are osteopenic. There is diffuse soft tissue swelling of fourth digit. No radiopaque foreign object identified. No soft tissue gas. IMPRESSION: No acute osseous pathology. Diffuse soft tissue swelling of the digit. No radiopaque foreign object. Electronically Signed   By: Anner Crete M.D.   On: 10/29/2015 21:50   I have personally reviewed and evaluated these images and lab results as part of my medical decision-making.    MDM   Final diagnoses:  Finger joint swelling, right   OTIE KALLENBACH presents with swelling of the PIP of the right ring finger. No known, however mild erythema and ecchymosis is more consistent with trauma or injury that infection. No increased warmth or evidence of septic joint.  X-rays without evidence of fracture. No instability of the joint however splint was placed for comfort.     Jarrett Soho Jadakiss Barish, PA-C [id number]  Delora Fuel, MD [id number]

## 2015-10-30 NOTE — Discharge Instructions (Signed)
1. Medications: Tylenol for pain control, usual home medications 2. Treatment: rest, ice, elevate and use splint, drink plenty of fluids, gentle stretching 3. Follow Up: Please followup with orthopedics as directed or your PCP in 1 week if no improvement for discussion of your diagnoses and further evaluation after today's visit; if you do not have a primary care doctor use the resource guide provided to find one; Please return to the ER for worsening symptoms or other concerns

## 2015-10-30 NOTE — ED Notes (Signed)
MD at bedside. 

## 2015-11-18 NOTE — Progress Notes (Signed)
Remote transmission rescheduled for 11/24/2015.

## 2015-11-24 ENCOUNTER — Ambulatory Visit (INDEPENDENT_AMBULATORY_CARE_PROVIDER_SITE_OTHER): Payer: Medicare Other | Admitting: *Deleted

## 2015-11-24 DIAGNOSIS — I495 Sick sinus syndrome: Secondary | ICD-10-CM | POA: Diagnosis not present

## 2015-11-24 DIAGNOSIS — I442 Atrioventricular block, complete: Secondary | ICD-10-CM | POA: Diagnosis not present

## 2015-11-24 DIAGNOSIS — Z950 Presence of cardiac pacemaker: Secondary | ICD-10-CM | POA: Diagnosis not present

## 2015-11-24 NOTE — Progress Notes (Signed)
Remote pacemaker transmission.   

## 2015-11-25 ENCOUNTER — Telehealth: Payer: Self-pay

## 2015-11-25 NOTE — Telephone Encounter (Signed)
Remote ICM transmission received.  Attempted patient call and left message for return call.   

## 2015-11-25 NOTE — Telephone Encounter (Signed)
Follow Up:; ° ° °Returning your call. °

## 2015-11-25 NOTE — Progress Notes (Signed)
EPIC Encounter for ICM Monitoring  Patient Name: NECO VIVERITO is a 82 y.o. male Date: 11/25/2015 Primary Care Physican: No PCP Per Patient Primary Cardiologist: [place] Electrophysiologist: Lovena Le Dry Weight: unknown Bi-V Pacing:  95%       Attempted patient call and unable to reach.  Thoracic impedence stable   ICM trend: 11/21/2015     Follow-up plan: ICM clinic phone appointment on 12/26/2015.  Copy of ICM check sent to device physician.   Rosalene Billings, RN 11/25/2015 10:02 AM

## 2015-11-25 NOTE — Telephone Encounter (Signed)
Returned patients call and left message with direct phone number for return call.

## 2015-11-28 ENCOUNTER — Encounter (HOSPITAL_COMMUNITY): Payer: Self-pay

## 2015-11-28 ENCOUNTER — Ambulatory Visit (HOSPITAL_COMMUNITY)
Admission: RE | Admit: 2015-11-28 | Discharge: 2015-11-28 | Disposition: A | Discharge status: Home / Self Care | Payer: Medicare Other | Source: Ambulatory Visit | Attending: Cardiology | Admitting: Cardiology

## 2015-11-28 VITALS — BP 104/54 | HR 73 | Wt 174.2 lb

## 2015-11-28 DIAGNOSIS — I130 Hypertensive heart and chronic kidney disease with heart failure and stage 1 through stage 4 chronic kidney disease, or unspecified chronic kidney disease: Secondary | ICD-10-CM | POA: Insufficient documentation

## 2015-11-28 DIAGNOSIS — N183 Chronic kidney disease, stage 3 unspecified: Secondary | ICD-10-CM

## 2015-11-28 DIAGNOSIS — I255 Ischemic cardiomyopathy: Secondary | ICD-10-CM | POA: Diagnosis not present

## 2015-11-28 DIAGNOSIS — I5022 Chronic systolic (congestive) heart failure: Secondary | ICD-10-CM

## 2015-11-28 DIAGNOSIS — Z79899 Other long term (current) drug therapy: Secondary | ICD-10-CM | POA: Insufficient documentation

## 2015-11-28 DIAGNOSIS — Z8546 Personal history of malignant neoplasm of prostate: Secondary | ICD-10-CM | POA: Diagnosis not present

## 2015-11-28 DIAGNOSIS — Z8249 Family history of ischemic heart disease and other diseases of the circulatory system: Secondary | ICD-10-CM | POA: Insufficient documentation

## 2015-11-28 DIAGNOSIS — Z7901 Long term (current) use of anticoagulants: Secondary | ICD-10-CM | POA: Diagnosis not present

## 2015-11-28 DIAGNOSIS — Z951 Presence of aortocoronary bypass graft: Secondary | ICD-10-CM | POA: Insufficient documentation

## 2015-11-28 DIAGNOSIS — I4892 Unspecified atrial flutter: Secondary | ICD-10-CM | POA: Diagnosis not present

## 2015-11-28 DIAGNOSIS — I495 Sick sinus syndrome: Secondary | ICD-10-CM | POA: Diagnosis not present

## 2015-11-28 DIAGNOSIS — I480 Paroxysmal atrial fibrillation: Secondary | ICD-10-CM | POA: Insufficient documentation

## 2015-11-28 DIAGNOSIS — Z950 Presence of cardiac pacemaker: Secondary | ICD-10-CM | POA: Diagnosis not present

## 2015-11-28 DIAGNOSIS — I5023 Acute on chronic systolic (congestive) heart failure: Secondary | ICD-10-CM

## 2015-11-28 DIAGNOSIS — I2582 Chronic total occlusion of coronary artery: Secondary | ICD-10-CM | POA: Insufficient documentation

## 2015-11-28 DIAGNOSIS — E785 Hyperlipidemia, unspecified: Secondary | ICD-10-CM | POA: Diagnosis not present

## 2015-11-28 DIAGNOSIS — I2510 Atherosclerotic heart disease of native coronary artery without angina pectoris: Secondary | ICD-10-CM | POA: Diagnosis not present

## 2015-11-28 DIAGNOSIS — R001 Bradycardia, unspecified: Secondary | ICD-10-CM | POA: Insufficient documentation

## 2015-11-28 MED ORDER — CARVEDILOL 12.5 MG PO TABS: 12.5000 mg | ORAL_TABLET | Freq: Two times a day (BID) | ORAL | Status: DC

## 2015-11-28 NOTE — Progress Notes (Signed)
Advanced Heart Failure Clinic Note   Patient ID: Marcus [name], male   DOB: 09-02-1928, 82 y.o.   MRN: [id number] EP: Dr Lovena Le  Cardiology: [name] is an 82 yo with history of pacemaker for bradycardia, paroxysmal atrial fibrillation, CAD s/p CABG and PCIs, and ischemic cardiomyopathy EF 20-25%. Underwent CRT-D upgrade 10/08/13 due to 85% RV pacing.     Admitted 5/19-5/26/15 with volume overload. He was diuresed with milrinone and IV lasix and discharge weight was 153 lbs. He underwent upgrade of his ICD to CRT-D as well.  He was able to wean off milrinone and did much better after CRT upgrade.  He was admitted in 3/16 with pacemaker pocket infection.  CRT-D device was explanted and temporary-permanent pacemaker with placed.  He later had a St Jude CRT-P system re-implanted.  He was noted to be back in atrial fibrillation and had TEE-guided DCCV to NSR.  TEE showed EF 25-30%.    He presents today for regular followup. Feels good overall. He has some generalized fatigue.  He states he tinkers out in the yard until about 1 or 2 pm, and then is mostly done for the day. Not weighing at home.  Weight up 2 lbs from last visit ( 8 months). Hasn't had much SOB per patient, though wife states he purses his lips a lot after he has been active.  No lightheadedness or dizziness. Taking torsemide every other day, and sometimes skips a day. No edema. No bleeding on Eliquis but bruises easily.   Corevue reviewed: No evidence for volume overload, no atrial fibrillation, 95% BiV paced.   Labs (3/16): K 4.1 => 4.6, creatinine 1.87 => 1.97, HCT 34.4, TSH normal, LFTs normal.  Labs (6/16): K 4.2, creatinine 2.34 Labs (10/16): K 4.8, creatinine 2.29 Labs (6/17): K 5.1, creatinine 1.89, AST 30, ALT 25, TSH normal, LDL 159, HCT 37  ECG: A-BiV sequential pacing  PMH:  1. Symptomatic bradycardia: Med[employer]onic CRT-D.  He developed pocket infection in 3/16 and had device extracted.  He had St Jude CRT-P system  replaced.  2. HTN 3. Hyperlipidemia: Has refused statins.  4. Atrial fibrillation: History of prior DCCV.  Paroxysmal.  On Eliquis.  Has history of cardioembolism to right leg with right popliteal and tibial embolectomy (had been off Eliquis). Has had nausea with amiodarone in the past but now tolerating.  Recurrence in 3/16, had TEE-guided DCCV.  5. Prostate cancer 6. CAD: CABG remotely with SVG-OM and LIMA-LAD.  Late 1990s had PCI to RCA.  In 12/09 had LHC with total occlusion of SVG-OM, patent LIMA-LAD, DES to LCx and DES to RCA.   7. Ischemic cardiomyopathy: Echo (3/15) with EF 20-25%, wall motion abnormalities noted, mild to moderately decreased RV systolic function. ICD upgraded to CRT-D 10/08/13.  TEE (3/16) with EF 25-30%, mildly dilated RV with mildly decreased systolic function, mild to moderate MR.  8. CKD  SH: Married, nonsmoker, lives in [place]  FH: CAD  ROS: All systems reviewed and negative except as per HPI.   Current Outpatient Prescriptions  Medication Sig Dispense Refill  . amiodarone (PACERONE) 200 MG tablet Take 0.5 tablets (100 mg total) by mouth daily. 30 tablet 3  . Ascorbic Acid (VITAMIN C) 1000 MG tablet Take 1,000 mg by mouth daily.    Marland Kitchen CALCIUM PO Take 1 tablet by mouth daily.     . carvedilol (COREG) 6.25 MG tablet Take 1.5 tablets (9.375 mg total) by mouth 2 (two) times daily with a meal.  90 tablet 3  . Cholecalciferol (VITAMIN D PO) Take 1 capsule by mouth daily.     . Coenzyme Q10 (CO Q 10) 100 MG CAPS Take 1 capsule by mouth 2 (two) times daily.    Marland Kitchen ELIQUIS 2.5 MG TABS tablet TAKE 1 TABLET TWICE A DAY 60 tablet 3  . isosorbide mononitrate (IMDUR) 30 MG 24 hr tablet Take 0.5 tablets (15 mg total) by mouth daily. 45 tablet 3  . spironolactone (ALDACTONE) 25 MG tablet TAKE ONE-HALF TABLET BY MOUTH ONCE DAILY 45 tablet 3  . torsemide (DEMADEX) 20 MG tablet Take 20 mg by mouth every other day.    . vitamin E 400 UNIT capsule Take 400 Units by mouth daily.       No current facility-administered medications for this encounter.    Filed Vitals:   11/28/15 1347  BP: 104/54  Pulse: 73  Weight: 174 lb 4 oz (79.039 kg)  SpO2: 100%   Wt Readings from Last 3 Encounters:  11/28/15 174 lb 4 oz (79.039 kg)  08/19/15 175 lb (79.379 kg)  02/23/15 172 lb 12.8 oz (78.382 kg)    General: NAD, wife present Neck: JVP 6 no thyromegaly or thyroid nodule.  Lungs: CTA CV: Nondisplaced PMI.  Heart regular S1/S2, occasional ectopy 1/6 SEM.   No carotid bruit.  Normal pedal pulses.  Abdomen: Soft, nontender, no hepatosplenomegaly, no distention.  Skin: Intact without lesions or rashes.  Neurologic: Alert and oriented x 3.  Psych: Normal affect. Extremities: No clubbing or cyanosis. Trace ankle edema.  Assessment/Plan:  1. Chronic systolic CHF: Ischemic cardiomyopathy.  EF 25-30% on 3/16 TEE.  Has St Jude CRT-P device.  NYHA class II symptoms.  Volume status stable on exam and by Corevue. - Continue torsemide 20 mg every other day.   - Continue current spironolactone.   - Increase Coreg 12.5 mg bid. Should let us know if fatigue worsens.  - Not on ACEI/ARB with CKD. - BMET today.  - Repeat echo.  2. Atrial fibrillation: Paroxysmal. He is in NSR on amiodarone 100 mg daily. - LFTs/TSH stable on recent check with PCP. Getting yearly eye exams.  Repeat LFTs/TSH at next appt. - He is on Eliquis 2.5 mg bid (age, elevated creatinine), CBC ok recently. 3. CAD: Stable. No chest pain. Continue medical management.  - He is not currently on a statin (he has refused to take a statin, discussed again today and he still does not want to try one).   - He is not on ASA given use of Eliquis and stable CAD.  4. Sick sinus syndrome: s/p CRT-P. 5. CKD, stage III: Baseline creatinine 1.8-1.9.  - Had been elevated closer to 2.2 on checks, but 1.89 via PCP last month.   Increase coreg as above. Follow up in 4 months with Echo.   Satira Mccallum Tillery PA-C 11/28/2015    Patient seen with PA, agree with the above note.  Stable, NYHA class II symptoms.  Some fatigue.  No volume overload by exam or Corevue.  - Increase Coreg to 12.5 mg bid and continue spironolactone 12.5 mg daily. - Echo at followup.  - Continue amiodarone, he is in NSR.  Recent LFTs/TSH normal.  Needs regular eye exam.   Continues to refuse statin.   [name] 11/29/2015

## 2015-11-28 NOTE — Patient Instructions (Signed)
Increase Carvedilol to 12.5 mg Twice daily, we have sent you in a new prescription for 12.5 mg tablets  We will contact you in 4 months to schedule your next appointment and echocardiogram

## 2015-11-29 ENCOUNTER — Other Ambulatory Visit (HOSPITAL_COMMUNITY): Payer: Self-pay | Admitting: *Deleted

## 2015-11-30 ENCOUNTER — Encounter: Payer: Self-pay | Admitting: Cardiology

## 2015-12-01 ENCOUNTER — Other Ambulatory Visit (HOSPITAL_COMMUNITY): Payer: Self-pay | Admitting: *Deleted

## 2015-12-01 LAB — CUP PACEART REMOTE DEVICE CHECK
Battery Remaining Longevity: 69 mo
Battery Remaining Percentage: 95 %
Battery Voltage: 2.98 V
Brady Statistic RA Percent Paced: 98 %
Brady Statistic RV Percent Paced: 95 %
Implantable Lead Implant Date: 20160304
Implantable Lead Implant Date: 20160304
Implantable Lead Implant Date: 20160304
Implantable Lead Location: 753858
Implantable Lead Location: 753860
Lead Channel Impedance Value: 490 Ohm
Lead Channel Impedance Value: 550 Ohm
Lead Channel Impedance Value: 940 Ohm
Lead Channel Pacing Threshold Amplitude: 1 V
Lead Channel Pacing Threshold Amplitude: 1 V
Lead Channel Sensing Intrinsic Amplitude: 1.5 mV
Lead Channel Sensing Intrinsic Amplitude: 12 mV
Lead Channel Setting Pacing Amplitude: 2 V
Lead Channel Setting Pacing Pulse Width: 0.5 ms
Lead Channel Setting Sensing Sensitivity: 5 mV
MDC IDC LEAD LOCATION: 753859
MDC IDC MSMT LEADCHNL LV PACING THRESHOLD PULSEWIDTH: 0.6 ms
MDC IDC MSMT LEADCHNL RV PACING THRESHOLD PULSEWIDTH: 0.5 ms
MDC IDC PG MODEL: 3242
MDC IDC SESS DTM: 20170713140044
MDC IDC SET LEADCHNL LV PACING AMPLITUDE: 2 V
MDC IDC SET LEADCHNL LV PACING PULSEWIDTH: 0.6 ms
MDC IDC SET LEADCHNL RA PACING AMPLITUDE: 2.5 V
Pulse Gen Serial Number: 7725037

## 2015-12-06 ENCOUNTER — Encounter: Payer: Self-pay | Admitting: Internal Medicine

## 2015-12-26 ENCOUNTER — Ambulatory Visit (INDEPENDENT_AMBULATORY_CARE_PROVIDER_SITE_OTHER): Payer: Medicare Other

## 2015-12-26 ENCOUNTER — Telehealth: Payer: Self-pay

## 2015-12-26 DIAGNOSIS — I5022 Chronic systolic (congestive) heart failure: Secondary | ICD-10-CM | POA: Diagnosis not present

## 2015-12-26 DIAGNOSIS — Z950 Presence of cardiac pacemaker: Secondary | ICD-10-CM | POA: Diagnosis not present

## 2015-12-26 NOTE — Telephone Encounter (Signed)
Remote ICM transmission received.  Attempted patient call and left message for return call.   

## 2015-12-26 NOTE — Progress Notes (Signed)
EPIC Encounter for ICM Monitoring  Patient Name: Marcus [name] is a 82 y.o. male Date: 12/26/2015 Primary Care Physican: No PCP Per Patient Primary Cardiologist: [place] Electrophysiologist: Lovena Le Dry Weight: unknown Bi-V Pacing:  95%       Attempted patient call and unable to reach.  Transmission reviewed.   Thoracic impedance abnormal suggesting fluid accumulation 12/04/2015 to 12/12/2015 and 12/23/2015 to 12/26/2015, almost back to baseline on 12/26/2015.        Per Dr Haroldine Laws last office visit note, baseline creatinine 1.8-1.9. - Had been elevated closer to 2.2 on checks, but 1.89 via PCP last month (June).    ICM trend: 12/26/2015     Follow-up plan: ICM clinic phone appointment on 01/26/2016.  Copy of ICM check sent to device physician.   Rosalene Billings, RN 12/26/2015 10:43 AM

## 2016-01-10 NOTE — Progress Notes (Signed)
Received return call from patient.  He reported he is doing well and denied any fluid symptoms.  Reviewed transmission and he denied any fluid symptoms during decreased impedance 12/23/2015 to 12/26/2015.  Weight is stable.  Advised next ICM remote transmission scheduled for 01/26/2016.  Patient gives verbal permission to speak with wife, Marcus [name] and leave detailed message on home phone.

## 2016-01-11 ENCOUNTER — Telehealth (HOSPITAL_COMMUNITY): Payer: Self-pay

## 2016-01-11 NOTE — Telephone Encounter (Signed)
Dr. Landry Corporal [facility] calling to get cardiac clearance on patient from Dr. Aundra Dubin. Will forward to MD to address the following:  1.  Eliquis on hold pre op?  If so, how many days preop?  When should patient start back post op?  2. Preop antibiotics?  Renee Pain, RN

## 2016-01-11 NOTE — Telephone Encounter (Signed)
Given history of pacemaker pocket infection, would pre-treat with amoxicillin 2 g prior to dental work.  Can hold Eliquis 2 days prior to procedure.

## 2016-01-12 ENCOUNTER — Other Ambulatory Visit (HOSPITAL_COMMUNITY): Payer: Self-pay | Admitting: *Deleted

## 2016-01-12 ENCOUNTER — Telehealth (HOSPITAL_COMMUNITY): Payer: Self-pay | Admitting: *Deleted

## 2016-01-12 MED ORDER — ISOSORBIDE MONONITRATE ER 30 MG PO TB24: 15.0000 mg | ORAL_TABLET | Freq: Every day | ORAL | 3 refills | Status: DC

## 2016-01-12 MED ORDER — SPIRONOLACTONE 25 MG PO TABS: ORAL_TABLET | ORAL | 3 refills | Status: DC

## 2016-01-12 NOTE — Telephone Encounter (Signed)
Pt's wife is aware of Dr Claris Gladden recommendations, will contact dentist in AM

## 2016-01-12 NOTE — Telephone Encounter (Signed)
Pt's wife called concerned about pt's foot, she states the left foot is very sore, starts behind big toe and hurts all the way around.  Uncomfortable for shoes to touch it, has to limp when walks.  She reports she is unsure if it is swollen or not, he is able to wear shoe.  seh is concerned about a blood clot, advised that would be an unusual place for blood clot and pt is on Eliquis.  Advised pt f/u with pcp or urgent care for further eval, she is agreeable and will take him to urgent care tomorrow

## 2016-01-19 DIAGNOSIS — H04123 Dry eye syndrome of bilateral lacrimal glands: Secondary | ICD-10-CM | POA: Diagnosis not present

## 2016-01-19 DIAGNOSIS — H353132 Nonexudative age-related macular degeneration, bilateral, intermediate dry stage: Secondary | ICD-10-CM | POA: Diagnosis not present

## 2016-01-26 ENCOUNTER — Ambulatory Visit (INDEPENDENT_AMBULATORY_CARE_PROVIDER_SITE_OTHER): Payer: Medicare Other

## 2016-01-26 ENCOUNTER — Other Ambulatory Visit (HOSPITAL_COMMUNITY): Payer: Self-pay | Admitting: Internal Medicine

## 2016-01-26 ENCOUNTER — Telehealth: Payer: Self-pay

## 2016-01-26 DIAGNOSIS — Z950 Presence of cardiac pacemaker: Secondary | ICD-10-CM | POA: Diagnosis not present

## 2016-01-26 DIAGNOSIS — I5022 Chronic systolic (congestive) heart failure: Secondary | ICD-10-CM

## 2016-01-26 NOTE — Telephone Encounter (Signed)
Remote ICM transmission received.  Attempted patient call and left detailed message, requested  return call.

## 2016-01-26 NOTE — Progress Notes (Signed)
EPIC Encounter for ICM Monitoring  Patient Name: Marcus [name] is a 82 y.o. male Date: 01/26/2016 Primary Care Physican: No PCP Per Patient Primary Cardiologist: [place] Electrophysiologist: Lovena Le Dry Weight: unknown Bi-V Pacing:  96%       Attempted ICM call and unable to reach.  Transmission reviewed.   Thoracic impedance normal.  Follow-up plan: ICM clinic phone appointment on 02/29/2016.  Copy of ICM check sent to device physician.   ICM trend: 01/26/2016       Rosalene Billings, RN 01/26/2016 3:39 PM

## 2016-02-06 ENCOUNTER — Other Ambulatory Visit (HOSPITAL_COMMUNITY): Payer: Self-pay | Admitting: *Deleted

## 2016-02-06 DIAGNOSIS — I480 Paroxysmal atrial fibrillation: Secondary | ICD-10-CM

## 2016-02-06 DIAGNOSIS — I5023 Acute on chronic systolic (congestive) heart failure: Secondary | ICD-10-CM

## 2016-02-06 MED ORDER — AMIODARONE HCL 200 MG PO TABS: 100.0000 mg | ORAL_TABLET | Freq: Every day | ORAL | 3 refills | Status: DC

## 2016-02-20 ENCOUNTER — Telehealth (HOSPITAL_COMMUNITY): Payer: Self-pay | Admitting: *Deleted

## 2016-02-20 NOTE — Telephone Encounter (Signed)
Pt's wife called very concerned about pt, she states since he was here in July he has been going down hill.  She report he is very fatigued all the time and really doesn't have energy to do anything.  She reports wt is stable, no edema, he is more SOB with activity.  She has been checking BP past week or so:  9/24 110/169 9/25 95/59 9/26 108/63, 109/67 9/28 110/69 am, 105/62 HR 87 pm 9/29 99/61, 81 AM, 110/67 91 PM 9/30 101/60 73 AM 10/2 87/59  At last OV we increased Carvedilol to 12.5 mg BID, she states pt did that for a few days and felt so bad she decreased him back down to 9.375 mg BID and he has been on that dose since then but has continued to feel worse.  Added pt onto Dr Claris Gladden schedule for tomorrow

## 2016-02-21 ENCOUNTER — Ambulatory Visit (HOSPITAL_COMMUNITY)
Admission: RE | Admit: 2016-02-21 | Discharge: 2016-02-21 | Disposition: A | Discharge status: Home / Self Care | Payer: Medicare Other | Source: Ambulatory Visit | Attending: Cardiology | Admitting: Cardiology

## 2016-02-21 ENCOUNTER — Encounter (HOSPITAL_COMMUNITY): Payer: Self-pay | Admitting: *Deleted

## 2016-02-21 ENCOUNTER — Other Ambulatory Visit (HOSPITAL_COMMUNITY): Payer: Self-pay | Admitting: *Deleted

## 2016-02-21 VITALS — BP 98/60 | HR 72 | Wt 167.2 lb

## 2016-02-21 DIAGNOSIS — I5022 Chronic systolic (congestive) heart failure: Secondary | ICD-10-CM | POA: Diagnosis not present

## 2016-02-21 DIAGNOSIS — Z8546 Personal history of malignant neoplasm of prostate: Secondary | ICD-10-CM | POA: Diagnosis not present

## 2016-02-21 DIAGNOSIS — Z951 Presence of aortocoronary bypass graft: Secondary | ICD-10-CM | POA: Insufficient documentation

## 2016-02-21 DIAGNOSIS — E785 Hyperlipidemia, unspecified: Secondary | ICD-10-CM | POA: Insufficient documentation

## 2016-02-21 DIAGNOSIS — I130 Hypertensive heart and chronic kidney disease with heart failure and stage 1 through stage 4 chronic kidney disease, or unspecified chronic kidney disease: Secondary | ICD-10-CM | POA: Diagnosis not present

## 2016-02-21 DIAGNOSIS — Z7901 Long term (current) use of anticoagulants: Secondary | ICD-10-CM | POA: Diagnosis not present

## 2016-02-21 DIAGNOSIS — N183 Chronic kidney disease, stage 3 unspecified: Secondary | ICD-10-CM

## 2016-02-21 DIAGNOSIS — I495 Sick sinus syndrome: Secondary | ICD-10-CM | POA: Diagnosis not present

## 2016-02-21 DIAGNOSIS — Z8249 Family history of ischemic heart disease and other diseases of the circulatory system: Secondary | ICD-10-CM | POA: Diagnosis not present

## 2016-02-21 DIAGNOSIS — I4892 Unspecified atrial flutter: Secondary | ICD-10-CM | POA: Diagnosis not present

## 2016-02-21 DIAGNOSIS — I480 Paroxysmal atrial fibrillation: Secondary | ICD-10-CM | POA: Insufficient documentation

## 2016-02-21 DIAGNOSIS — I2510 Atherosclerotic heart disease of native coronary artery without angina pectoris: Secondary | ICD-10-CM | POA: Diagnosis not present

## 2016-02-21 DIAGNOSIS — I255 Ischemic cardiomyopathy: Secondary | ICD-10-CM | POA: Diagnosis not present

## 2016-02-21 LAB — CBC
HEMATOCRIT: 39.1 % (ref 39.0–52.0)
Hemoglobin: 12.7 g/dL — ABNORMAL LOW (ref 13.0–17.0)
MCH: 32.2 pg (ref 26.0–34.0)
MCHC: 32.5 g/dL (ref 30.0–36.0)
MCV: 99 fL (ref 78.0–100.0)
Platelets: 149 10*3/uL — ABNORMAL LOW (ref 150–400)
RBC: 3.95 MIL/uL — ABNORMAL LOW (ref 4.22–5.81)
RDW: 14.5 % (ref 11.5–15.5)
WBC: 4.9 10*3/uL (ref 4.0–10.5)

## 2016-02-21 LAB — COMPREHENSIVE METABOLIC PANEL
ALBUMIN: 3.7 g/dL (ref 3.5–5.0)
ALK PHOS: 33 U/L — AB (ref 38–126)
ALT: 14 U/L — ABNORMAL LOW (ref 17–63)
AST: 17 U/L (ref 15–41)
Anion gap: 8 (ref 5–15)
BILIRUBIN TOTAL: 0.9 mg/dL (ref 0.3–1.2)
BUN: 75 mg/dL — AB (ref 6–20)
CALCIUM: 9.6 mg/dL (ref 8.9–10.3)
CO2: 22 mmol/L (ref 22–32)
Chloride: 108 mmol/L (ref 101–111)
Creatinine, Ser: 2.45 mg/dL — ABNORMAL HIGH (ref 0.61–1.24)
GFR calc Af Amer: 26 mL/min — ABNORMAL LOW (ref >60)
GFR calc non Af Amer: 23 mL/min — ABNORMAL LOW (ref >60)
GLUCOSE: 78 mg/dL (ref 65–99)
Potassium: 5 mmol/L (ref 3.5–5.1)
SODIUM: 138 mmol/L (ref 135–145)
TOTAL PROTEIN: 7.3 g/dL (ref 6.5–8.1)

## 2016-02-21 LAB — TSH: TSH: 1.136 u[IU]/mL (ref 0.350–4.500)

## 2016-02-21 LAB — BRAIN NATRIURETIC PEPTIDE: B NATRIURETIC PEPTIDE 5: 453.4 pg/mL — AB (ref 0.0–100.0)

## 2016-02-21 MED ORDER — CARVEDILOL 6.25 MG PO TABS: 6.2500 mg | ORAL_TABLET | Freq: Two times a day (BID) | ORAL | Status: DC

## 2016-02-21 NOTE — Patient Instructions (Signed)
Decrease Carvedilol to 6.25 mg Twice daily   Labs today  Your physician has requested that you have an echocardiogram. Echocardiography is a painless test that uses sound waves to create images of your heart. It provides your doctor with information about the size and shape of your heart and how well your heart's chambers and valves are working. This procedure takes approximately one hour. There are no restrictions for this procedure.  Right Heart Catheterization Thur 10/12, see instruction sheet  Your physician recommends that you schedule a follow-up appointment in: 1 month

## 2016-02-22 ENCOUNTER — Ambulatory Visit (HOSPITAL_COMMUNITY)
Admission: RE | Admit: 2016-02-22 | Discharge: 2016-02-22 | Disposition: A | Discharge status: Home / Self Care | Payer: Medicare Other | Source: Ambulatory Visit | Attending: Internal Medicine | Admitting: Internal Medicine

## 2016-02-22 ENCOUNTER — Telehealth (HOSPITAL_COMMUNITY): Payer: Self-pay | Admitting: Cardiology

## 2016-02-22 DIAGNOSIS — I5022 Chronic systolic (congestive) heart failure: Secondary | ICD-10-CM

## 2016-02-22 DIAGNOSIS — I351 Nonrheumatic aortic (valve) insufficiency: Secondary | ICD-10-CM | POA: Diagnosis not present

## 2016-02-22 DIAGNOSIS — I517 Cardiomegaly: Secondary | ICD-10-CM | POA: Diagnosis not present

## 2016-02-22 DIAGNOSIS — I340 Nonrheumatic mitral (valve) insufficiency: Secondary | ICD-10-CM | POA: Insufficient documentation

## 2016-02-22 NOTE — Telephone Encounter (Signed)
-----   Message from [name], MD sent at 02/21/2016 11:04 PM EDT ----- Stop torsemide for now.  Would also hold spironolactone for 2 days then restart at 12.5 mg daily.  Low K diet.

## 2016-02-22 NOTE — Progress Notes (Signed)
  Echocardiogram 2D Echocardiogram has been performed.  Marcus [name] 02/22/2016, 3:08 PM

## 2016-02-22 NOTE — Telephone Encounter (Signed)
Patient aware. pts wife aware and voiced understanding, will leave low K diet in the front office

## 2016-02-22 NOTE — Progress Notes (Signed)
Advanced Heart Failure Clinic Note   Patient ID: DEMARCO BACCI, male   DOB: Jul 02, 1928, 82 y.o.   MRN: [id number] EP: Dr Lovena Le  Cardiology: [name] is an 82 yo with history of pacemaker for bradycardia, paroxysmal atrial fibrillation, CAD s/p CABG and PCIs, and ischemic cardiomyopathy EF 20-25%. Underwent CRT-D upgrade 10/08/13 due to 85% RV pacing.     Admitted 5/19-5/26/15 with volume overload. He was diuresed with milrinone and IV lasix and discharge weight was 153 lbs. He underwent upgrade of his ICD to CRT-D as well.  He was able to wean off milrinone and did much better after CRT upgrade.  He was admitted in 3/16 with pacemaker pocket infection.  CRT-D device was explanted and temporary-permanent pacemaker with placed.  He later had a St Jude CRT-P system re-implanted.  He was noted to be back in atrial fibrillation and had TEE-guided DCCV to NSR.  TEE showed EF 25-30%.    He called for appointment today. For several months, he has been fatiguing easily.  For the last 2 months, fatigue has been much worse.  He is not lightheaded, BP has been on the lower side but stable chronically.  No BRBPR or melena. Weight is down 7 lbs. He is not particularly short of breath with exertion, tiredness/fatigue/poor energy are his main complaints.  No orthopnea/PND.  No chest pain. He is not as active as in the past.   Corevue reviewed: No evidence for volume overload, no atrial fibrillation, 96% BiV paced.   Labs (3/16): K 4.1 => 4.6, creatinine 1.87 => 1.97, HCT 34.4, TSH normal, LFTs normal.  Labs (6/16): K 4.2, creatinine 2.34 Labs (10/16): K 4.8, creatinine 2.29 Labs (6/17): K 5.1, creatinine 1.89, AST 30, ALT 25, TSH normal, LDL 159, HCT 37  PMH:  1. Symptomatic bradycardia: Medtronic CRT-D.  He developed pocket infection in 3/16 and had device extracted.  He had St Jude CRT-P system replaced.  2. HTN 3. Hyperlipidemia: Has refused statins.  4. Atrial fibrillation: History of prior  DCCV.  Paroxysmal.  On Eliquis.  Has history of cardioembolism to right leg with right popliteal and tibial embolectomy (had been off Eliquis). Has had nausea with amiodarone in the past but now tolerating.  Recurrence in 3/16, had TEE-guided DCCV.  5. Prostate cancer 6. CAD: CABG remotely with SVG-OM and LIMA-LAD.  Late 1990s had PCI to RCA.  In 12/09 had LHC with total occlusion of SVG-OM, patent LIMA-LAD, DES to LCx and DES to RCA.   7. Ischemic cardiomyopathy: Echo (3/15) with EF 20-25%, wall motion abnormalities noted, mild to moderately decreased RV systolic function. ICD upgraded to [place] CRT-D 10/08/13.  TEE (3/16) with EF 25-30%, mildly dilated RV with mildly decreased systolic function, mild to moderate MR.  8. CKD  SH: Married, nonsmoker, lives in [place]  FH: CAD  ROS: All systems reviewed and negative except as per HPI.   Current Outpatient Prescriptions  Medication Sig Dispense Refill  . amiodarone (PACERONE) 200 MG tablet Take 0.5 tablets (100 mg total) by mouth daily. 30 tablet 3  . Ascorbic Acid (VITAMIN C) 1000 MG tablet Take 1,000 mg by mouth daily.    Marland Kitchen CALCIUM PO Take 1 tablet by mouth daily.     . carvedilol (COREG) 6.25 MG tablet Take 1 tablet (6.25 mg total) by mouth 2 (two) times daily with a meal.    . Cholecalciferol (VITAMIN D PO) Take 1 capsule by mouth daily.     Marland Kitchen  Coenzyme Q10 (CO Q 10) 100 MG CAPS Take 1 capsule by mouth 2 (two) times daily.    Marland Kitchen ELIQUIS 2.5 MG TABS tablet TAKE 1 TABLET TWICE A DAY 60 tablet 3  . isosorbide mononitrate (IMDUR) 30 MG 24 hr tablet Take 0.5 tablets (15 mg total) by mouth daily. 45 tablet 3  . spironolactone (ALDACTONE) 25 MG tablet TAKE ONE-HALF TABLET BY MOUTH ONCE DAILY 45 tablet 3  . torsemide (DEMADEX) 20 MG tablet Take 20 mg by mouth every other day.    . vitamin E 400 UNIT capsule Take 400 Units by mouth daily.     No current facility-administered medications for this encounter.     Vitals:   02/21/16 1438  BP:  98/60  Pulse: 72  SpO2: 99%  Weight: 167 lb 4 oz (75.9 kg)   Wt Readings from Last 3 Encounters:  02/21/16 167 lb 4 oz (75.9 kg)  11/28/15 174 lb 4 oz (79 kg)  08/19/15 175 lb (79.4 kg)    General: NAD, wife present Neck: JVP 6 no thyromegaly or thyroid nodule.  Lungs: CTA CV: Nondisplaced PMI.  Heart regular S1/S2, occasional ectopy 1/6 SEM.   No carotid bruit.  Normal pedal pulses.  Abdomen: Soft, nontender, no hepatosplenomegaly, no distention.  Skin: Intact without lesions or rashes.  Neurologic: Alert and oriented x 3.  Psych: Normal affect. Extremities: No clubbing or cyanosis. Trace ankle edema.  Assessment/Plan:  1. Chronic systolic CHF: Ischemic cardiomyopathy.  EF 25-30% on 3/16 TEE.  Has St Jude CRT-P device.  Volume status stable on exam and by Corevue. His main complaint today is marked fatigue.  Dyspnea is not prominent.  - Check CBC and TSH.  - Continue torsemide 20 mg every other day, BMET today.  - Continue current spironolactone 12.5 daily.   - Decrease Coreg to 6.25 mg bid to see if symptoms improve.   - Not on ACEI/ARB with CKD. - Repeat echo.  - I am concerned that his symptoms could be due to low output heart failure ("cold/dry" profile).  I am going to arrange for RHC for next week. He will hold Eliquis the day of cath and the day before. I discussed risks/benefits of cath with patient and he agrees to proceed.  2. Atrial fibrillation: Paroxysmal. He is in NSR on amiodarone 100 mg daily. - Check LFTs, TSH.  Needs regular eye exam. - He is on Eliquis 2.5 mg bid (age, elevated creatinine), will get CBC today.  3. CAD: Stable. No chest pain. Continue medical management.  - He is not currently on a statin (he has refused to take a statin, discussed again today and he still does not want to try one).   - He is not on ASA given use of Eliquis and stable CAD.  4. Sick sinus syndrome: s/p CRT-P. 5. CKD, stage III: Baseline creatinine 1.8-1.9.  BMET today.    Elise Benne 02/22/2016

## 2016-02-29 ENCOUNTER — Telehealth: Payer: Self-pay

## 2016-02-29 ENCOUNTER — Ambulatory Visit (INDEPENDENT_AMBULATORY_CARE_PROVIDER_SITE_OTHER): Payer: Medicare Other | Admitting: *Deleted

## 2016-02-29 DIAGNOSIS — Z950 Presence of cardiac pacemaker: Secondary | ICD-10-CM

## 2016-02-29 DIAGNOSIS — I5022 Chronic systolic (congestive) heart failure: Secondary | ICD-10-CM

## 2016-02-29 DIAGNOSIS — I495 Sick sinus syndrome: Secondary | ICD-10-CM

## 2016-02-29 NOTE — Progress Notes (Signed)
EPIC Encounter for ICM Monitoring  Patient Name: Marcus [name] is a 82 y.o. male Date: 02/29/2016 Primary Care Physican: No PCP Per Patient Primary Cardiologist:[place] Electrophysiologist: Lovena Le Dry Weight: unknown Bi-V Pacing:  96%             Attempted ICM call and unable to reach.  Transmission reviewed.   Thoracic impedance abnormal suggesting fluid accumulation but trending back to baseline today.  Follow-up plan: ICM clinic phone appointment on 04/26/2016.  Office appointment with HF clinic 03/26/2016.  Cardiac cath scheduled for 03/01/2016.  Per HF note on 02/22/2016 Torsemide was stopped.  Copy of ICM check sent to primary cardiologist and device physician.   ICM trend: 02/29/2016       Rosalene Billings, RN 02/29/2016 1:04 PM

## 2016-02-29 NOTE — Telephone Encounter (Signed)
Remote ICM transmission received.  Attempted patient call and left detailed message regarding transmission and next ICM scheduled for 04/26/2016.  Advised to return call for any fluid symptoms or questions.    

## 2016-02-29 NOTE — Progress Notes (Signed)
Remote pacemaker transmission.   

## 2016-02-29 NOTE — Progress Notes (Signed)
Received call back from wife.  She stated patient is scheduled for cardiac cath tomorrow.  He does not have any fluid symptoms at this time.  She reported he is taking Torsemide 10 mg every other day as directed.  Next ICM remote transmission scheduled for 04/26/2016.

## 2016-03-01 ENCOUNTER — Encounter: Payer: Self-pay | Admitting: Cardiology

## 2016-03-01 ENCOUNTER — Encounter (HOSPITAL_COMMUNITY)
Admission: RE | Disposition: A | Discharge status: Home / Self Care | Payer: Self-pay | Source: Ambulatory Visit | Attending: Cardiology

## 2016-03-01 ENCOUNTER — Encounter (HOSPITAL_COMMUNITY): Payer: Self-pay | Admitting: *Deleted

## 2016-03-01 ENCOUNTER — Ambulatory Visit (HOSPITAL_COMMUNITY)
Admission: RE | Admit: 2016-03-01 | Discharge: 2016-03-01 | Disposition: A | Discharge status: Home / Self Care | Payer: Medicare Other | Source: Ambulatory Visit | Attending: Cardiology | Admitting: Cardiology

## 2016-03-01 DIAGNOSIS — I480 Paroxysmal atrial fibrillation: Secondary | ICD-10-CM | POA: Insufficient documentation

## 2016-03-01 DIAGNOSIS — Z8546 Personal history of malignant neoplasm of prostate: Secondary | ICD-10-CM | POA: Insufficient documentation

## 2016-03-01 DIAGNOSIS — I5022 Chronic systolic (congestive) heart failure: Secondary | ICD-10-CM | POA: Diagnosis not present

## 2016-03-01 DIAGNOSIS — I495 Sick sinus syndrome: Secondary | ICD-10-CM | POA: Insufficient documentation

## 2016-03-01 DIAGNOSIS — Z8249 Family history of ischemic heart disease and other diseases of the circulatory system: Secondary | ICD-10-CM | POA: Diagnosis not present

## 2016-03-01 DIAGNOSIS — I130 Hypertensive heart and chronic kidney disease with heart failure and stage 1 through stage 4 chronic kidney disease, or unspecified chronic kidney disease: Secondary | ICD-10-CM | POA: Insufficient documentation

## 2016-03-01 DIAGNOSIS — Z951 Presence of aortocoronary bypass graft: Secondary | ICD-10-CM | POA: Diagnosis not present

## 2016-03-01 DIAGNOSIS — N183 Chronic kidney disease, stage 3 (moderate): Secondary | ICD-10-CM | POA: Insufficient documentation

## 2016-03-01 DIAGNOSIS — I509 Heart failure, unspecified: Secondary | ICD-10-CM | POA: Diagnosis not present

## 2016-03-01 DIAGNOSIS — Z7901 Long term (current) use of anticoagulants: Secondary | ICD-10-CM | POA: Insufficient documentation

## 2016-03-01 DIAGNOSIS — I255 Ischemic cardiomyopathy: Secondary | ICD-10-CM | POA: Diagnosis not present

## 2016-03-01 DIAGNOSIS — I2582 Chronic total occlusion of coronary artery: Secondary | ICD-10-CM | POA: Insufficient documentation

## 2016-03-01 DIAGNOSIS — E785 Hyperlipidemia, unspecified: Secondary | ICD-10-CM | POA: Diagnosis not present

## 2016-03-01 DIAGNOSIS — Z95810 Presence of automatic (implantable) cardiac defibrillator: Secondary | ICD-10-CM | POA: Insufficient documentation

## 2016-03-01 DIAGNOSIS — I2510 Atherosclerotic heart disease of native coronary artery without angina pectoris: Secondary | ICD-10-CM | POA: Diagnosis not present

## 2016-03-01 DIAGNOSIS — R5383 Other fatigue: Secondary | ICD-10-CM | POA: Diagnosis present

## 2016-03-01 HISTORY — PX: CARDIAC CATHETERIZATION: SHX172

## 2016-03-01 LAB — PROTIME-INR
INR: 1.04
Prothrombin Time: 13.6 seconds (ref 11.4–15.2)

## 2016-03-01 LAB — POCT I-STAT 3, VENOUS BLOOD GAS (G3P V)
ACID-BASE DEFICIT: 8 mmol/L — AB (ref 0.0–2.0)
Acid-base deficit: 8 mmol/L — ABNORMAL HIGH (ref 0.0–2.0)
BICARBONATE: 18.8 mmol/L — AB (ref 20.0–28.0)
BICARBONATE: 19.1 mmol/L — AB (ref 20.0–28.0)
O2 SAT: 58 %
O2 Saturation: 61 %
PCO2 VEN: 43.2 mmHg — AB (ref 44.0–60.0)
PH VEN: 7.274 (ref 7.250–7.430)
PO2 VEN: 35 mmHg (ref 32.0–45.0)
PO2 VEN: 36 mmHg (ref 32.0–45.0)
TCO2: 20 mmol/L (ref 0–100)
TCO2: 20 mmol/L (ref 0–100)
pCO2, Ven: 40.6 mmHg — ABNORMAL LOW (ref 44.0–60.0)
pH, Ven: 7.255 (ref 7.250–7.430)

## 2016-03-01 SURGERY — RIGHT HEART CATH

## 2016-03-01 MED ORDER — FENTANYL CITRATE (PF) 100 MCG/2ML IJ SOLN
INTRAMUSCULAR | Status: AC
  Filled 2016-03-01: qty 2

## 2016-03-01 MED ORDER — SODIUM CHLORIDE 0.9% FLUSH: 3.0000 mL | INTRAVENOUS | Status: DC | PRN

## 2016-03-01 MED ORDER — ACETAMINOPHEN 325 MG PO TABS: 650.0000 mg | ORAL_TABLET | ORAL | Status: DC | PRN

## 2016-03-01 MED ORDER — SODIUM CHLORIDE 0.9 % IV SOLN: 250.0000 mL | INTRAVENOUS | Status: DC | PRN

## 2016-03-01 MED ORDER — ASPIRIN 81 MG PO CHEW
CHEWABLE_TABLET | ORAL | Status: AC
Start: 2016-03-01 — End: 2016-03-01
  Administered 2016-03-01: 81 mg via ORAL
  Filled 2016-03-01: qty 1

## 2016-03-01 MED ORDER — SODIUM CHLORIDE 0.9% FLUSH: 3.0000 mL | Freq: Two times a day (BID) | INTRAVENOUS | Status: DC

## 2016-03-01 MED ORDER — ONDANSETRON HCL 4 MG/2ML IJ SOLN: 4.0000 mg | Freq: Four times a day (QID) | INTRAMUSCULAR | Status: DC | PRN

## 2016-03-01 MED ORDER — MIDAZOLAM HCL 2 MG/2ML IJ SOLN
INTRAMUSCULAR | Status: DC | PRN
  Administered 2016-03-01: 0.5 mg via INTRAVENOUS

## 2016-03-01 MED ORDER — MIDAZOLAM HCL 2 MG/2ML IJ SOLN
INTRAMUSCULAR | Status: AC
  Filled 2016-03-01: qty 2

## 2016-03-01 MED ORDER — SODIUM CHLORIDE 0.9 % IV SOLN
INTRAVENOUS | Status: DC
  Administered 2016-03-01: 12:00:00 via INTRAVENOUS

## 2016-03-01 MED ORDER — ASPIRIN 81 MG PO CHEW
81.0000 mg | CHEWABLE_TABLET | ORAL | Status: AC
  Administered 2016-03-01: 81 mg via ORAL

## 2016-03-01 MED ORDER — LIDOCAINE HCL (PF) 1 % IJ SOLN
INTRAMUSCULAR | Status: DC | PRN
  Administered 2016-03-01: 2 mL via INTRADERMAL

## 2016-03-01 MED ORDER — LIDOCAINE HCL (PF) 1 % IJ SOLN
INTRAMUSCULAR | Status: AC
  Filled 2016-03-01: qty 30

## 2016-03-01 MED ORDER — FENTANYL CITRATE (PF) 100 MCG/2ML IJ SOLN
INTRAMUSCULAR | Status: DC | PRN
  Administered 2016-03-01: 12.5 ug via INTRAVENOUS

## 2016-03-01 MED ORDER — HEPARIN (PORCINE) IN NACL 2-0.9 UNIT/ML-% IJ SOLN
INTRAMUSCULAR | Status: AC
  Filled 2016-03-01: qty 500

## 2016-03-01 MED ORDER — HEPARIN (PORCINE) IN NACL 2-0.9 UNIT/ML-% IJ SOLN
INTRAMUSCULAR | Status: DC | PRN
  Administered 2016-03-01: 500 mL

## 2016-03-01 SURGICAL SUPPLY — 9 items
CATH BALLN WEDGE 5F 110CM (CATHETERS) ×3 IMPLANT
GUIDEWIRE .025 260CM (WIRE) ×3 IMPLANT
PACK CARDIAC CATHETERIZATION (CUSTOM PROCEDURE TRAY) ×3 IMPLANT
SHEATH FAST CATH BRACH 5F 5CM (SHEATH) ×3 IMPLANT
TRANSDUCER W/STOPCOCK (MISCELLANEOUS) ×3 IMPLANT
TUBING ART PRESS 72  MALE/FEM (TUBING) ×2
TUBING ART PRESS 72 MALE/FEM (TUBING) ×1 IMPLANT
TUBING CIL FLEX 10 FLL-RA (TUBING) ×3 IMPLANT
WIRE EMERALD 3MM-J .025X260CM (WIRE) ×3 IMPLANT

## 2016-03-01 NOTE — H&P (View-Only) (Signed)
Advanced Heart Failure Clinic Note   Patient ID: Marcus [name], male   DOB: [birth year], 82 y.o.   MRN: [id number] EP: Dr Lovena Le  Cardiology: [name] is an 82 yo with history of pacemaker for bradycardia, paroxysmal atrial fibrillation, CAD s/p CABG and PCIs, and ischemic cardiomyopathy EF 20-25%. Underwent CRT-D upgrade 10/08/13 due to 85% RV pacing.     Admitted 5/19-5/26/15 with volume overload. He was diuresed with milrinone and IV lasix and discharge weight was 153 lbs. He underwent upgrade of his ICD to CRT-D as well.  He was able to wean off milrinone and did much better after CRT upgrade.  He was admitted in 3/16 with pacemaker pocket infection.  CRT-D device was explanted and temporary-permanent pacemaker with placed.  He later had a St Jude CRT-P system re-implanted.  He was noted to be back in atrial fibrillation and had TEE-guided DCCV to NSR.  TEE showed EF 25-30%.    He called for appointment today. For several months, he has been fatiguing easily.  For the last 2 months, fatigue has been much worse.  He is not lightheaded, BP has been on the lower side but stable chronically.  No BRBPR or melena. Weight is down 7 lbs. He is not particularly short of breath with exertion, tiredness/fatigue/poor energy are his main complaints.  No orthopnea/PND.  No chest pain. He is not as active as in the past.   Corevue reviewed: No evidence for volume overload, no atrial fibrillation, 96% BiV paced.   Labs (3/16): K 4.1 => 4.6, creatinine 1.87 => 1.97, HCT 34.4, TSH normal, LFTs normal.  Labs (6/16): K 4.2, creatinine 2.34 Labs (10/16): K 4.8, creatinine 2.29 Labs (6/17): K 5.1, creatinine 1.89, AST 30, ALT 25, TSH normal, LDL 159, HCT 37  PMH:  1. Symptomatic bradycardia: Medtronic CRT-D.  He developed pocket infection in 3/16 and had device extracted.  He had St Jude CRT-P system replaced.  2. HTN 3. Hyperlipidemia: Has refused statins.  4. Atrial fibrillation: History of prior  DCCV.  Paroxysmal.  On Eliquis.  Has history of cardioembolism to right leg with right popliteal and tibial embolectomy (had been off Eliquis). Has had nausea with amiodarone in the past but now tolerating.  Recurrence in 3/16, had TEE-guided DCCV.  5. Prostate cancer 6. CAD: CABG remotely with SVG-OM and LIMA-LAD.  Late 1990s had PCI to RCA.  In 12/09 had LHC with total occlusion of SVG-OM, patent LIMA-LAD, DES to LCx and DES to RCA.   7. Ischemic cardiomyopathy: Echo (3/15) with EF 20-25%, wall motion abnormalities noted, mild to moderately decreased RV systolic function. ICD upgraded to [place] CRT-D 10/08/13.  TEE (3/16) with EF 25-30%, mildly dilated RV with mildly decreased systolic function, mild to moderate MR.  8. CKD  SH: Married, nonsmoker, lives in [place]  FH: CAD  ROS: All systems reviewed and negative except as per HPI.   Current Outpatient Prescriptions  Medication Sig Dispense Refill  . amiodarone (PACERONE) 200 MG tablet Take 0.5 tablets (100 mg total) by mouth daily. 30 tablet 3  . Ascorbic Acid (VITAMIN C) 1000 MG tablet Take 1,000 mg by mouth daily.    Marland Kitchen CALCIUM PO Take 1 tablet by mouth daily.     . carvedilol (COREG) 6.25 MG tablet Take 1 tablet (6.25 mg total) by mouth 2 (two) times daily with a meal.    . Cholecalciferol (VITAMIN D PO) Take 1 capsule by mouth daily.     Marland Kitchen  Coenzyme Q10 (CO Q 10) 100 MG CAPS Take 1 capsule by mouth 2 (two) times daily.    Marland Kitchen ELIQUIS 2.5 MG TABS tablet TAKE 1 TABLET TWICE A DAY 60 tablet 3  . isosorbide mononitrate (IMDUR) 30 MG 24 hr tablet Take 0.5 tablets (15 mg total) by mouth daily. 45 tablet 3  . spironolactone (ALDACTONE) 25 MG tablet TAKE ONE-HALF TABLET BY MOUTH ONCE DAILY 45 tablet 3  . torsemide (DEMADEX) 20 MG tablet Take 20 mg by mouth every other day.    . vitamin E 400 UNIT capsule Take 400 Units by mouth daily.     No current facility-administered medications for this encounter.     Vitals:   02/21/16 1438  BP:  98/60  Pulse: 72  SpO2: 99%  Weight: 167 lb 4 oz (75.9 kg)   Wt Readings from Last 3 Encounters:  02/21/16 167 lb 4 oz (75.9 kg)  11/28/15 174 lb 4 oz (79 kg)  08/19/15 175 lb (79.4 kg)    General: NAD, wife present Neck: JVP 6 no thyromegaly or thyroid nodule.  Lungs: CTA CV: Nondisplaced PMI.  Heart regular S1/S2, occasional ectopy 1/6 SEM.   No carotid bruit.  Normal pedal pulses.  Abdomen: Soft, nontender, no hepatosplenomegaly, no distention.  Skin: Intact without lesions or rashes.  Neurologic: Alert and oriented x 3.  Psych: Normal affect. Extremities: No clubbing or cyanosis. Trace ankle edema.  Assessment/Plan:  1. Chronic systolic CHF: Ischemic cardiomyopathy.  EF 25-30% on 3/16 TEE.  Has St Jude CRT-P device.  Volume status stable on exam and by Corevue. His main complaint today is marked fatigue.  Dyspnea is not prominent.  - Check CBC and TSH.  - Continue torsemide 20 mg every other day, BMET today.  - Continue current spironolactone 12.5 daily.   - Decrease Coreg to 6.25 mg bid to see if symptoms improve.   - Not on ACEI/ARB with CKD. - Repeat echo.  - I am concerned that his symptoms could be due to low output heart failure ("cold/dry" profile).  I am going to arrange for RHC for next week. He will hold Eliquis the day of cath and the day before. I discussed risks/benefits of cath with patient and he agrees to proceed.  2. Atrial fibrillation: Paroxysmal. He is in NSR on amiodarone 100 mg daily. - Check LFTs, TSH.  Needs regular eye exam. - He is on Eliquis 2.5 mg bid (age, elevated creatinine), will get CBC today.  3. CAD: Stable. No chest pain. Continue medical management.  - He is not currently on a statin (he has refused to take a statin, discussed again today and he still does not want to try one).   - He is not on ASA given use of Eliquis and stable CAD.  4. Sick sinus syndrome: s/p CRT-P. 5. CKD, stage III: Baseline creatinine 1.8-1.9.  BMET today.    Elise Benne 02/22/2016

## 2016-03-01 NOTE — Discharge Instructions (Signed)
Venogram, Care After °Refer to this sheet in the next few weeks. These instructions provide you with information on caring for yourself after your procedure. Your health care provider may also give you more specific instructions. Your treatment has been planned according to current medical practices, but problems sometimes occur. Call your health care provider if you have any problems or questions after your procedure. °WHAT TO EXPECT AFTER THE PROCEDURE °After your procedure, it is typical to have the following sensations: °· Mild discomfort at the catheter insertion site. °HOME CARE INSTRUCTIONS  °· Take all medicines exactly as directed. °· Follow any prescribed diet. °· Follow instructions regarding both rest and physical activity. °· Drink more fluids for the first several days after the procedure in order to help flush dye from your kidneys. °SEEK MEDICAL CARE IF: °· You develop a rash. °· You have fever not controlled by medicine. °SEEK IMMEDIATE MEDICAL CARE IF: °· There is pain, drainage, bleeding, redness, swelling, warmth or a red streak at the site of the IV tube. °· The extremity where your IV tube was placed becomes discolored, numb, or cool. °· You have difficulty breathing or shortness of breath. °· You develop chest pain. °· You have excessive dizziness or fainting. °  °This information is not intended to replace advice given to you by your health care provider. Make sure you discuss any questions you have with your health care provider. °  °Document Released: 02/25/2013 Document Revised: 05/12/2013 Document Reviewed: 02/25/2013 °Elsevier Interactive Patient Education ©2016 [employer] ° °

## 2016-03-01 NOTE — Interval H&P Note (Signed)
History and Physical Interval Note:  03/01/2016 1:27 PM  Marcus [name]  has presented today for surgery, with the diagnosis of hf  The various methods of treatment have been discussed with the patient and family. After consideration of risks, benefits and other options for treatment, the patient has consented to  Procedure(s): Right Heart Cath ([name]) as a surgical intervention .  The patient's history has been reviewed, patient examined, no change in status, stable for surgery.  I have reviewed the patient's chart and labs.  Questions were answered to the patient's satisfaction.     D[place] [employer]

## 2016-03-02 ENCOUNTER — Encounter: Payer: Self-pay | Admitting: Internal Medicine

## 2016-03-02 ENCOUNTER — Encounter (HOSPITAL_COMMUNITY): Payer: Self-pay | Admitting: Cardiology

## 2016-03-05 ENCOUNTER — Telehealth (HOSPITAL_COMMUNITY): Payer: Self-pay | Admitting: Vascular Surgery

## 2016-03-05 DIAGNOSIS — I5022 Chronic systolic (congestive) heart failure: Secondary | ICD-10-CM

## 2016-03-05 NOTE — Telephone Encounter (Signed)
Ok to order cardiac rehab per Dr Aundra Dubin, referral placed, left VM for pt's wife this was done and they will contact pt to sch, may take 1-2 weeks

## 2016-03-05 NOTE — Telephone Encounter (Signed)
Pt would like to be set up  For cardiac rehab, pt had cath last week [facility] mentioned rehab to pt and he wants to get set up ASAP.Marland Kitchen PLEASE ADVISE

## 2016-03-15 ENCOUNTER — Encounter: Payer: Self-pay | Admitting: Cardiology

## 2016-03-26 ENCOUNTER — Ambulatory Visit (HOSPITAL_COMMUNITY)
Admission: RE | Admit: 2016-03-26 | Discharge: 2016-03-26 | Disposition: A | Discharge status: Home / Self Care | Payer: Medicare Other | Source: Ambulatory Visit | Attending: Cardiology | Admitting: Cardiology

## 2016-03-26 ENCOUNTER — Encounter (HOSPITAL_COMMUNITY): Payer: Self-pay

## 2016-03-26 VITALS — BP 108/64 | HR 77 | Wt 174.8 lb

## 2016-03-26 DIAGNOSIS — Z8249 Family history of ischemic heart disease and other diseases of the circulatory system: Secondary | ICD-10-CM | POA: Insufficient documentation

## 2016-03-26 DIAGNOSIS — I495 Sick sinus syndrome: Secondary | ICD-10-CM

## 2016-03-26 DIAGNOSIS — N183 Chronic kidney disease, stage 3 unspecified: Secondary | ICD-10-CM

## 2016-03-26 DIAGNOSIS — I480 Paroxysmal atrial fibrillation: Secondary | ICD-10-CM | POA: Insufficient documentation

## 2016-03-26 DIAGNOSIS — Z951 Presence of aortocoronary bypass graft: Secondary | ICD-10-CM | POA: Insufficient documentation

## 2016-03-26 DIAGNOSIS — Z79899 Other long term (current) drug therapy: Secondary | ICD-10-CM | POA: Diagnosis not present

## 2016-03-26 DIAGNOSIS — I255 Ischemic cardiomyopathy: Secondary | ICD-10-CM | POA: Insufficient documentation

## 2016-03-26 DIAGNOSIS — I2510 Atherosclerotic heart disease of native coronary artery without angina pectoris: Secondary | ICD-10-CM | POA: Diagnosis not present

## 2016-03-26 DIAGNOSIS — Z7901 Long term (current) use of anticoagulants: Secondary | ICD-10-CM | POA: Insufficient documentation

## 2016-03-26 DIAGNOSIS — Z8546 Personal history of malignant neoplasm of prostate: Secondary | ICD-10-CM | POA: Insufficient documentation

## 2016-03-26 DIAGNOSIS — I5022 Chronic systolic (congestive) heart failure: Secondary | ICD-10-CM | POA: Diagnosis not present

## 2016-03-26 DIAGNOSIS — I130 Hypertensive heart and chronic kidney disease with heart failure and stage 1 through stage 4 chronic kidney disease, or unspecified chronic kidney disease: Secondary | ICD-10-CM | POA: Insufficient documentation

## 2016-03-26 DIAGNOSIS — E785 Hyperlipidemia, unspecified: Secondary | ICD-10-CM | POA: Insufficient documentation

## 2016-03-26 DIAGNOSIS — I4892 Unspecified atrial flutter: Secondary | ICD-10-CM | POA: Diagnosis not present

## 2016-03-26 LAB — COMPREHENSIVE METABOLIC PANEL
ALBUMIN: 3.5 g/dL (ref 3.5–5.0)
ALK PHOS: 34 U/L — AB (ref 38–126)
ALT: 18 U/L (ref 17–63)
ANION GAP: 7 (ref 5–15)
AST: 25 U/L (ref 15–41)
BILIRUBIN TOTAL: 0.9 mg/dL (ref 0.3–1.2)
BUN: 26 mg/dL — AB (ref 6–20)
CALCIUM: 9.2 mg/dL (ref 8.9–10.3)
CO2: 26 mmol/L (ref 22–32)
Chloride: 107 mmol/L (ref 101–111)
Creatinine, Ser: 1.77 mg/dL — ABNORMAL HIGH (ref 0.61–1.24)
GFR calc Af Amer: 39 mL/min — ABNORMAL LOW (ref >60)
GFR, EST NON AFRICAN AMERICAN: 33 mL/min — AB (ref >60)
GLUCOSE: 87 mg/dL (ref 65–99)
Potassium: 4.3 mmol/L (ref 3.5–5.1)
Sodium: 140 mmol/L (ref 135–145)
TOTAL PROTEIN: 6.8 g/dL (ref 6.5–8.1)

## 2016-03-26 NOTE — Progress Notes (Signed)
Advanced Heart Failure Clinic Note   Patient ID: DAYRON ODLAND, male   DOB: 16-Aug-1928, 82 y.o.   MRN: [id number] EP: Dr Lovena Le  Cardiology: [name] is an 82 yo with history of pacemaker for bradycardia, paroxysmal atrial fibrillation, CAD s/p CABG and PCIs, and ischemic cardiomyopathy EF 20-25%. Underwent CRT-D upgrade 10/08/13 due to 80% RV pacing.     Admitted 5/19-5/26/15 with volume overload. He was diuresed with milrinone and IV lasix and discharge weight was 153 lbs. He underwent upgrade of his ICD to CRT-D as well.  He was able to wean off milrinone and did much better after CRT upgrade.  He was admitted in 3/16 with pacemaker pocket infection.  CRT-D device was explanted and temporary-permanent pacemaker with placed.  He later had a St Jude CRT-P system re-implanted.  He was noted to be back in atrial fibrillation and had TEE-guided DCCV to NSR.  TEE showed EF 25-30%.    At last appointment, he reported increased fatigue and lethargy. RHC in 10/17 showed near-normal filling pressures and low but not markedly low cardiac output.  Coreg was cut back to 6.25 mg bid.  Echo in 10/17 showed improvement in LV function, EF 35-40%.  Today, he feels more energy, better overall.  BP stable. No exertional dyspnea though not very active.  No orthopnea/PND.  No chest pain.  No BRBPR or melena.   Corevue reviewed: thoracic impedance is down suggesting volume overload.   Labs (3/16): K 4.1 => 4.6, creatinine 1.87 => 1.97, HCT 34.4, TSH normal, LFTs normal.  Labs (6/16): K 4.2, creatinine 2.34 Labs (10/16): K 4.8, creatinine 2.29 Labs (6/17): K 5.1, creatinine 1.89, AST 30, ALT 25, TSH normal, LDL 159, HCT 37 Labs (10/17): K 5, creatinine 2.45, TSH normal, hgb 12.7, BNP 453  PMH:  1. Symptomatic bradycardia: Medtronic CRT-D.  He developed pocket infection in 3/16 and had device extracted.  He had St Jude CRT-P system replaced.  2. HTN 3. Hyperlipidemia: Has refused statins.  4. Atrial  fibrillation: History of prior DCCV.  Paroxysmal.  On Eliquis.  Has history of cardioembolism to right leg with right popliteal and tibial embolectomy (had been off Eliquis). Has had nausea with amiodarone in the past but now tolerating.  Recurrence in 3/16, had TEE-guided DCCV.  5. Prostate cancer 6. CAD: CABG remotely with SVG-OM and LIMA-LAD.  Late 1990s had PCI to RCA.  In 12/09 had LHC with total occlusion of SVG-OM, patent LIMA-LAD, DES to LCx and DES to RCA.   7. Ischemic cardiomyopathy: Echo (3/15) with EF 20-25%, wall motion abnormalities noted, mild to moderately decreased RV systolic function. ICD upgraded to [place] CRT-D 10/08/13.  TEE (3/16) with EF 25-30%, mildly dilated RV with mildly decreased systolic function, mild to moderate MR.  - RHC (10/17): mean RA 4, PA 44/21 mean 31, unable to wedge, CI 2.0. - Echo (10/17): EF 35-40%, moderate dilation, inferior/inferolateral AK, moderate MR, RV mildly dilated with mildly decreased systolic function.  8. CKD  SH: Married, nonsmoker, lives in [place]  [place]: CAD  ROS: All systems reviewed and negative except as per HPI.   Current Outpatient Prescriptions  Medication Sig Dispense Refill  . amiodarone (PACERONE) 200 MG tablet Take 0.5 tablets (100 mg total) by mouth daily. 30 tablet 3  . Ascorbic Acid (VITAMIN C) 1000 MG tablet Take 1,000 mg by mouth daily.    . carvedilol (COREG) 6.25 MG tablet Take 1 tablet (6.25 mg total) by mouth 2 (two)  times daily with a meal.    . Cholecalciferol (VITAMIN D PO) Take 1 capsule by mouth daily.     . Coenzyme Q10 (CO Q 10) 100 MG CAPS Take 1 capsule by mouth 2 (two) times daily.    Marland Kitchen ELIQUIS 2.5 MG TABS tablet TAKE 1 TABLET TWICE A DAY 60 tablet 3  . isosorbide mononitrate (IMDUR) 30 MG 24 hr tablet Take 0.5 tablets (15 mg total) by mouth daily. 45 tablet 3  . Multiple Vitamins-Minerals (PRESERVISION AREDS 2 PO) Take 1 capsule by mouth 2 (two) times daily.    Marland Kitchen spironolactone (ALDACTONE) 25 MG  tablet TAKE ONE-HALF TABLET BY MOUTH ONCE DAILY 45 tablet 3  . torsemide (DEMADEX) 20 MG tablet Take 20 mg by mouth every other day.    . vitamin E 400 UNIT capsule Take 400 Units by mouth daily.     No current facility-administered medications for this encounter.     Vitals:   03/26/16 0953  BP: 108/64  Pulse: 77  SpO2: 100%  Weight: 174 lb 12 oz (79.3 kg)   Wt Readings from Last 3 Encounters:  03/26/16 174 lb 12 oz (79.3 kg)  03/01/16 165 lb (74.8 kg)  02/21/16 167 lb 4 oz (75.9 kg)    General: NAD, wife present Neck: JVP 6 no thyromegaly or thyroid nodule.  Lungs: CTA CV: Nondisplaced PMI.  Heart regular S1/S2, occasional ectopy 1/6 SEM.   No carotid bruit.  Normal pedal pulses.  Abdomen: Soft, nontender, no hepatosplenomegaly, no distention.  Skin: Intact without lesions or rashes.  Neurologic: Alert and oriented x 3.  Psych: Normal affect. Extremities: No clubbing or cyanosis. Trace ankle edema.  Assessment/Plan:  1. Chronic systolic CHF: Ischemic cardiomyopathy.  EF 25-30% on 3/16 TEE, improved to 35-40% on most recent echo in 10/17.  Has St Jude CRT-P device.  He is feeling better on lower dose of Coreg, BP is higher. Volume looks ok by exam, but Corevue suggests volume overload.   - Take torsemide daily x 4 days then back to every other day.  BMET today.  - Continue current spironolactone 12.5 daily.   - Continue lower dose of Coreg.    - Not on ACEI/ARB with CKD. 2. Atrial fibrillation: Paroxysmal. He is in NSR on amiodarone 100 mg daily. - Check LFTs today, recent TSH normal.  Needs regular eye exam. - He is on Eliquis 2.5 mg bid (age, elevated creatinine).  3. CAD: Stable. No chest pain. Continue medical management.  - He is not currently on a statin (he has refused to take a statin).   - He is not on ASA given use of Eliquis and stable CAD.  4. Sick sinus syndrome: s/p CRT-P. 5. CKD, stage III: Baseline creatinine 1.8-1.9.  Higher recently, recheck today.    Followup in 3 months.    Elise Benne 03/26/2016

## 2016-03-26 NOTE — Patient Instructions (Signed)
Lab today  We will contact you in 3 months to schedule your next appointment.  

## 2016-03-27 ENCOUNTER — Other Ambulatory Visit (HOSPITAL_COMMUNITY): Payer: Self-pay | Admitting: Cardiology

## 2016-03-30 LAB — CUP PACEART REMOTE DEVICE CHECK
Brady Statistic RA Percent Paced: 98 %
Brady Statistic RV Percent Paced: 96 %
Implantable Lead Implant Date: 20160304
Lead Channel Impedance Value: 540 Ohm
Lead Channel Impedance Value: 950 Ohm
Lead Channel Pacing Threshold Amplitude: 1.25 V
Lead Channel Pacing Threshold Pulse Width: 1 ms
Lead Channel Sensing Intrinsic Amplitude: 12 mV
MDC IDC LEAD IMPLANT DT: 20160304
MDC IDC LEAD IMPLANT DT: 20160304
MDC IDC LEAD LOCATION: 753858
MDC IDC LEAD LOCATION: 753859
MDC IDC LEAD LOCATION: 753860
MDC IDC MSMT BATTERY REMAINING LONGEVITY: 68 mo
MDC IDC MSMT LEADCHNL LV PACING THRESHOLD AMPLITUDE: 1.125 V
MDC IDC MSMT LEADCHNL LV PACING THRESHOLD PULSEWIDTH: 0.6 ms
MDC IDC MSMT LEADCHNL RA IMPEDANCE VALUE: 480 Ohm
MDC IDC MSMT LEADCHNL RA SENSING INTR AMPL: 1.1 mV
MDC IDC MSMT LEADCHNL RV PACING THRESHOLD AMPLITUDE: 1 V
MDC IDC MSMT LEADCHNL RV PACING THRESHOLD PULSEWIDTH: 0.5 ms
MDC IDC PG IMPLANT DT: 20160304
MDC IDC PG MODEL: 3242
MDC IDC PG SERIAL: 7725037
MDC IDC SESS DTM: 20171110140251

## 2016-04-04 ENCOUNTER — Encounter: Payer: Self-pay | Admitting: Internal Medicine

## 2016-04-26 ENCOUNTER — Ambulatory Visit (INDEPENDENT_AMBULATORY_CARE_PROVIDER_SITE_OTHER): Payer: Medicare Other

## 2016-04-26 ENCOUNTER — Telehealth: Payer: Self-pay

## 2016-04-26 DIAGNOSIS — I5022 Chronic systolic (congestive) heart failure: Secondary | ICD-10-CM | POA: Diagnosis not present

## 2016-04-26 DIAGNOSIS — Z950 Presence of cardiac pacemaker: Secondary | ICD-10-CM

## 2016-04-26 NOTE — Progress Notes (Signed)
EPIC Encounter for ICM Monitoring  Patient Name: Marcus [name] is a 82 y.o. male Date: 04/26/2016 Primary Care Physican: No PCP Per Patient Primary Cardiologist:Bensimhon/[facility] Electrophysiologist: Lovena Le Dry Weight:unknown Bi-V Pacing: 96%      Attempted ICM call and unable to reach. Left message to return call. Transmission reviewed.   Thoracic impedance normal since 04/20/2016.  Was abnormal suggesting fluid accumulation from 11/18 to 12/1.   Abnormal impedance is progressively become more frequent with > 2 weeks at a time since 12/2015.    Labs: 03/26/2016 Creatinine 1.77, BUN 26, Potassium 4.3, Sodium 140, EGFR 33-39 02/21/2016 Creatinine 2.45, BUN 75, Potassium 5.0, Sodium 138, EGFR 23-26  02/23/2015 Creatinine 2.29, BUN 49, Potassium 4.8, Sodium 137, EGFR 25-29   Recommendations: NONE - Unable to reach patient   Follow-up plan: ICM clinic phone appointment on 05/04/2016 to recheck fluid levels.  Copy of ICM check sent to primary cardiologist and device physician.   ICM trend: 04/26/2016       Rosalene Billings, RN 04/26/2016 5:09 PM

## 2016-04-26 NOTE — Telephone Encounter (Signed)
Remote ICM transmission received.  Attempted patient and wife call and left message to return call

## 2016-04-29 NOTE — Progress Notes (Signed)
Change torsemide to 20 mg daily rather than every other day.  BMET in 10 days.

## 2016-04-30 NOTE — Progress Notes (Signed)
Attempted call to patient and wife to provide Dr Claris Gladden recommendations.  Left detailed voice message to call back regarding transmission and recommendations.  Provided direct ICM number.

## 2016-05-01 MED ORDER — TORSEMIDE 20 MG PO TABS: 20.0000 mg | ORAL_TABLET | Freq: Every day | ORAL | 3 refills | Status: DC

## 2016-05-01 NOTE — Addendum Note (Signed)
Addended by: Rosalene Billings on: 05/01/2016 09:39 AM   Modules accepted: Orders

## 2016-05-01 NOTE — Progress Notes (Signed)
Heather: Please arrange appt with PA/NP this week or next.

## 2016-05-01 NOTE — Progress Notes (Signed)
Call to wife and left detailed message that Dr Aundra Dubin recommended patient be seen at HF clinic within next 10 days and will call her to make the appointment.  Also advised the labs may be drawn at same time as the appointment and if not he will need the labs to drawn by 12/21.  Provided ICM number for call back

## 2016-05-01 NOTE — Progress Notes (Addendum)
Wife returned call.  Reviewed transmission and explained it shows patient is retaining fluid more often since September and for longer lengths of time.    SYMPTOMATIC: Patient has been feeling bad but does not provide specific symptoms.  Wife has noticed he woke up coughing in middle of night this past weekend and has had other episodes as well in the last couple of months.  Also she has noticed he has more shortness of breath when walking, sleeping more and less active.   She stated typically runs <100/70 and the last few days it is been higher than normal,123/80.  She understands normally that would be considered a good blood pressure but is higher for him.   DIET: Discussed salt intake and patient has been eating out breakfast lately as well as they do not review food labels at home.  Advised to read food labels for salt amount and he may not be aware of how much salt he is getting per day.    Advised Dr Aundra Dubin has recommended patient take Torsemide 20 mg daily and have BMET drawn in 10 days.   Wife asked if patient should be seen because she is worried about his symptoms.    Advised I would inform Dr Aundra Dubin that patient has not been feeling well in the last couple of months and having symptoms.   Patient does not have any HF clinic appointments scheduled at this time.

## 2016-05-04 ENCOUNTER — Ambulatory Visit (INDEPENDENT_AMBULATORY_CARE_PROVIDER_SITE_OTHER): Payer: Medicare Other

## 2016-05-04 DIAGNOSIS — Z950 Presence of cardiac pacemaker: Secondary | ICD-10-CM

## 2016-05-04 DIAGNOSIS — I5022 Chronic systolic (congestive) heart failure: Secondary | ICD-10-CM

## 2016-05-04 NOTE — Progress Notes (Signed)
EPIC Encounter for ICM Monitoring  Patient Name: Marcus [name] is a 82 y.o. male Date: 05/04/2016 Primary Care Physican: No PCP Per Patient Primary Cardiologist:Bensimhon/[facility] Electrophysiologist: Lovena Le Dry Weight:unknown Bi-V Pacing: 96%      Spoke with wife.  She stated coughing at night has subsided, he still is lethargic with shortness of breath when walking or climbing stairs.   Thoracic impedance returned to normal after increase in Torsemide to 20 mg daily.  Recommendations:  Advised message was sent to HF clinic to call her to make an appointment for this coming week and he will need labs drawn either at that appointment or cannot get an appointment next week then come to [address] office to have them drawn on 12/21.  She stated she would try to call HF clinic today for appointment.     Follow-up plan: ICM clinic phone appointment on 05/30/2016.  Copy of ICM check sent to primary cardiologist and device physician.   ICM trend: 05/04/2016       Rosalene Billings, RN 05/04/2016 3:24 PM

## 2016-05-08 ENCOUNTER — Ambulatory Visit (HOSPITAL_COMMUNITY)
Admission: RE | Admit: 2016-05-08 | Discharge: 2016-05-08 | Disposition: A | Discharge status: Home / Self Care | Payer: Medicare Other | Source: Ambulatory Visit | Attending: Cardiology | Admitting: Cardiology

## 2016-05-08 VITALS — BP 112/70 | HR 72 | Wt 177.6 lb

## 2016-05-08 DIAGNOSIS — N183 Chronic kidney disease, stage 3 unspecified: Secondary | ICD-10-CM

## 2016-05-08 DIAGNOSIS — I4892 Unspecified atrial flutter: Secondary | ICD-10-CM | POA: Diagnosis not present

## 2016-05-08 DIAGNOSIS — I480 Paroxysmal atrial fibrillation: Secondary | ICD-10-CM | POA: Insufficient documentation

## 2016-05-08 DIAGNOSIS — Z7901 Long term (current) use of anticoagulants: Secondary | ICD-10-CM | POA: Insufficient documentation

## 2016-05-08 DIAGNOSIS — I130 Hypertensive heart and chronic kidney disease with heart failure and stage 1 through stage 4 chronic kidney disease, or unspecified chronic kidney disease: Secondary | ICD-10-CM | POA: Diagnosis not present

## 2016-05-08 DIAGNOSIS — Z79899 Other long term (current) drug therapy: Secondary | ICD-10-CM | POA: Diagnosis not present

## 2016-05-08 DIAGNOSIS — I2510 Atherosclerotic heart disease of native coronary artery without angina pectoris: Secondary | ICD-10-CM | POA: Insufficient documentation

## 2016-05-08 DIAGNOSIS — Z951 Presence of aortocoronary bypass graft: Secondary | ICD-10-CM | POA: Insufficient documentation

## 2016-05-08 DIAGNOSIS — I5022 Chronic systolic (congestive) heart failure: Secondary | ICD-10-CM | POA: Diagnosis present

## 2016-05-08 DIAGNOSIS — R5383 Other fatigue: Secondary | ICD-10-CM

## 2016-05-08 DIAGNOSIS — I495 Sick sinus syndrome: Secondary | ICD-10-CM | POA: Insufficient documentation

## 2016-05-08 DIAGNOSIS — I255 Ischemic cardiomyopathy: Secondary | ICD-10-CM | POA: Diagnosis not present

## 2016-05-08 LAB — CBC
HEMATOCRIT: 37.2 % — AB (ref 39.0–52.0)
HEMOGLOBIN: 11.7 g/dL — AB (ref 13.0–17.0)
MCH: 31.1 pg (ref 26.0–34.0)
MCHC: 31.5 g/dL (ref 30.0–36.0)
MCV: 98.9 fL (ref 78.0–100.0)
PLATELETS: 147 10*3/uL — AB (ref 150–400)
RBC: 3.76 MIL/uL — AB (ref 4.22–5.81)
RDW: 13.9 % (ref 11.5–15.5)
WBC: 4.8 10*3/uL (ref 4.0–10.5)

## 2016-05-08 LAB — BASIC METABOLIC PANEL
ANION GAP: 7 (ref 5–15)
BUN: 40 mg/dL — ABNORMAL HIGH (ref 6–20)
CALCIUM: 9.9 mg/dL (ref 8.9–10.3)
CO2: 26 mmol/L (ref 22–32)
Chloride: 110 mmol/L (ref 101–111)
Creatinine, Ser: 2.16 mg/dL — ABNORMAL HIGH (ref 0.61–1.24)
GFR calc Af Amer: 30 mL/min — ABNORMAL LOW (ref >60)
GFR calc non Af Amer: 26 mL/min — ABNORMAL LOW (ref >60)
GLUCOSE: 120 mg/dL — AB (ref 65–99)
Potassium: 4.5 mmol/L (ref 3.5–5.1)
Sodium: 143 mmol/L (ref 135–145)

## 2016-05-08 LAB — BRAIN NATRIURETIC PEPTIDE: B Natriuretic Peptide: 771.4 pg/mL — ABNORMAL HIGH (ref 0.0–100.0)

## 2016-05-08 MED ORDER — TORSEMIDE 20 MG PO TABS: 20.0000 mg | ORAL_TABLET | Freq: Every day | ORAL | 3 refills | Status: DC

## 2016-05-08 NOTE — Patient Instructions (Signed)
Routine lab work today. Will notify you of abnormal results, otherwise no news is good news!  Torsemide refilled.  For the next TWO DAYS, take torsemide 40 mg (2 tabs) once daily, then reduce dose to 20 mg 91 tab) once daily.  Follow up 2 weeks with Dr. Aundra Dubin.  Do the following things EVERYDAY: 1) Weigh yourself in the morning before breakfast. Write it down and keep it in a log. 2) Take your medicines as prescribed 3) Eat low salt foods-Limit salt (sodium) to 2000 mg per day.  4) Stay as active as you can everyday 5) Limit all fluids for the day to less than 2 liters

## 2016-05-08 NOTE — Progress Notes (Signed)
Advanced Heart Failure Clinic Note   Patient ID: Marcus [name], male   DOB: 10-Jun-1928, 82 y.o.   MRN: [id number] EP: Dr Lovena Le  Cardiology: Marcus [name] is an 82 yo with history of pacemaker for bradycardia, paroxysmal atrial fibrillation, CAD s/p CABG and PCIs, and ischemic cardiomyopathy EF 20-25%. Underwent CRT-D upgrade 10/08/13 due to 85% RV pacing.     Admitted 5/19-5/26/15 with volume overload. He was diuresed with milrinone and IV lasix and discharge weight was 153 lbs. He underwent upgrade of his ICD to CRT-D as well.  He was able to wean off milrinone and did much better after CRT upgrade.  He was admitted in 3/16 with pacemaker pocket infection.  CRT-D device was explanted and temporary-permanent pacemaker with placed.  He later had a St Jude CRT-P system re-implanted.  He was noted to be back in atrial fibrillation and had TEE-guided DCCV to NSR.  TEE showed EF 25-30%.    Today he returns for HF follow up with his wife. Frustrated because he has been short of breath. Overall feeiling ok. Day time fatigue. SOB with exertion. Bendopnea. Complaining of fatigue. Denies PND/Orthopnea. Weight at home 166-170 pounds. No chest pain.  Appetitie ok. No BRBPR or melena.   Labs (3/16): K 4.1 => 4.6, creatinine 1.87 => 1.97, HCT 34.4, TSH normal, LFTs normal.  Labs (6/16): K 4.2, creatinine 2.34 Labs (10/16): K 4.8, creatinine 2.29 Labs (6/17): K 5.1, creatinine 1.89, AST 30, ALT 25, TSH normal, LDL 159, HCT 37 Labs (10/17): K 5, creatinine 2.45, TSH normal, hgb 12.7, BNP 453  PMH:  1. Symptomatic bradycardia: Medtronic CRT-D.  He developed pocket infection in 3/16 and had device extracted.  He had St Jude CRT-P system replaced.  2. HTN 3. Hyperlipidemia: Has refused statins.  4. Atrial fibrillation: History of prior DCCV.  Paroxysmal.  On Eliquis.  Has history of cardioembolism to right leg with right popliteal and tibial embolectomy (had been off Eliquis). Has had nausea with amiodarone  in the past but now tolerating.  Recurrence in 3/16, had TEE-guided DCCV.  5. Prostate cancer 6. CAD: CABG remotely with SVG-OM and LIMA-LAD.  Late 1990s had PCI to RCA.  In 12/09 had LHC with total occlusion of SVG-OM, patent LIMA-LAD, DES to LCx and DES to RCA.   7. Ischemic cardiomyopathy: Echo (3/15) with EF 20-25%, wall motion abnormalities noted, mild to moderately decreased RV systolic function. ICD upgraded to [place] CRT-D 10/08/13.  TEE (3/16) with EF 25-30%, mildly dilated RV with mildly decreased systolic function, mild to moderate MR.  - RHC (10/17): mean RA 4, PA 44/21 mean 31, unable to wedge, CI 2.0. - Echo (10/17): EF 35-40%, moderate dilation, inferior/inferolateral AK, moderate MR, RV mildly dilated with mildly decreased systolic function.  8. CKD  SH: Married, nonsmoker, lives in [place]  [place]: CAD  ROS: All systems reviewed and negative except as per HPI.   Current Outpatient Prescriptions  Medication Sig Dispense Refill  . amiodarone (PACERONE) 200 MG tablet Take 0.5 tablets (100 mg total) by mouth daily. 30 tablet 3  . Ascorbic Acid (VITAMIN C) 1000 MG tablet Take 1,000 mg by mouth daily.    . carvedilol (COREG) 6.25 MG tablet Take 1 tablet (6.25 mg total) by mouth 2 (two) times daily with a meal.    . Cholecalciferol (VITAMIN D PO) Take 1 capsule by mouth daily.     . Coenzyme Q10 (CO Q 10) 100 MG CAPS Take 1 capsule by mouth  2 (two) times daily.    Marland Kitchen ELIQUIS 2.5 MG TABS tablet TAKE 1 TABLET TWICE A DAY 60 tablet 3  . isosorbide mononitrate (IMDUR) 30 MG 24 hr tablet Take 0.5 tablets (15 mg total) by mouth daily. 45 tablet 3  . Multiple Vitamins-Minerals (PRESERVISION AREDS 2 PO) Take 1 capsule by mouth 2 (two) times daily.    Marland Kitchen spironolactone (ALDACTONE) 25 MG tablet TAKE ONE-HALF TABLET BY MOUTH ONCE DAILY 45 tablet 3  . torsemide (DEMADEX) 20 MG tablet Take 1 tablet (20 mg total) by mouth daily. 180 tablet 3  . vitamin E 400 UNIT capsule Take 400 Units by mouth  daily.     No current facility-administered medications for this encounter.     Vitals:   05/08/16 1018  BP: 112/70  Pulse: 72  SpO2: 100%  Weight: 177 lb 9.6 oz (80.6 kg)   Wt Readings from Last 3 Encounters:  05/08/16 177 lb 9.6 oz (80.6 kg)  03/26/16 174 lb 12 oz (79.3 kg)  03/01/16 165 lb (74.8 kg)    General: NAD, wife present Neck: JVP 10-11 no thyromegaly or thyroid nodule.  Lungs: CTA CV: Nondisplaced PMI.  Heart regular S1/S2, occasional ectopy 1/6 SEM.   No carotid bruit.  Normal pedal pulses.  Abdomen: Soft, nontender, no hepatosplenomegaly, + distention.  Skin: Intact without lesions or rashes.  Neurologic: Alert and oriented x 3.  Psych: Normal affect. Extremities: No clubbing or cyanosis. Trace ankle edema.  Assessment/Plan:  1. Chronic systolic CHF: Ischemic cardiomyopathy.  EF 25-30% on 3/16 TEE, improved to 35-40% on most recent echo in 10/17.  Has St Jude CRT-P device.  Recent RHC ok with adequate CO/CI.  NYHA III. Volume status elevated. Instructed to increase torsemide to 40 mg x2 days then go back to 20 mg daily.  Continue current dose of coreg and spiro,  - Not on ACEI/ARB with CKD. -Reinforced daily weights.  2. Atrial fibrillation: Paroxysmal. Regular pulse. Continue  amiodarone 100 mg daily. - Check LFTs today, recent TSH normal.  Needs regular eye exam. - He is on Eliquis 2.5 mg bid (age, elevated creatinine). Check CBC today.  3. CAD: Stable. No chest pain. Continue medical management.  - He is not currently on a statin (he has refused to take a statin).   - He is not on ASA given use of Eliquis and stable CAD.  4. Sick sinus syndrome: s/p CRT-P. 5. CKD, stage III: Baseline creatinine 1.8-1.9.  Check BMET today.  6. Day Time Fatigue- Offered sleep study however he was adamant he did not want to pursue.    Check BMET and CBC today.  Follow up with Dr Aundra Dubin in 2 weeks.   Darnell Level NP-C  05/08/2016

## 2016-05-08 NOTE — Progress Notes (Signed)
Advanced Heart Failure Medication Review by a Pharmacist  Does the patient  feel that his/her medications are working for him/her?  yes  Has the patient been experiencing any side effects to the medications prescribed?  no  Does the patient measure his/her own blood pressure or blood glucose at home?  no   Does the patient have any problems obtaining medications due to transportation or finances?   no  Understanding of regimen: good Understanding of indications: good Potential of compliance: good Patient understands to avoid NSAIDs. Patient understands to avoid decongestants.  Issues to address at subsequent visits: none   Pharmacist comments:  Marcus [name] is a pleasant 82 yo M presenting with his wife and without a medication list. He reports good compliance with his regimen and did not have any specific medication-related questions or concerns for me at this time.   [name]. Velva Harman, PharmD, BCPS, CPP Clinical Pharmacist Pager: [phone] Phone: [phone] 05/08/2016 10:20 AM      Time with patient: 10 minutes Preparation and documentation time: 2 minutes Total time: 12 minutes

## 2016-05-10 ENCOUNTER — Other Ambulatory Visit: Payer: Medicare Other

## 2016-05-11 ENCOUNTER — Other Ambulatory Visit: Payer: Self-pay | Admitting: Internal Medicine

## 2016-05-22 ENCOUNTER — Encounter (HOSPITAL_COMMUNITY): Payer: Self-pay

## 2016-05-22 ENCOUNTER — Ambulatory Visit (HOSPITAL_COMMUNITY)
Admission: RE | Admit: 2016-05-22 | Discharge: 2016-05-22 | Disposition: A | Discharge status: Home / Self Care | Payer: Medicare Other | Source: Ambulatory Visit | Attending: Internal Medicine | Admitting: Internal Medicine

## 2016-05-22 ENCOUNTER — Encounter: Payer: Self-pay | Admitting: Internal Medicine

## 2016-05-22 VITALS — BP 112/70 | HR 80 | Wt 173.5 lb

## 2016-05-22 DIAGNOSIS — Z8546 Personal history of malignant neoplasm of prostate: Secondary | ICD-10-CM | POA: Insufficient documentation

## 2016-05-22 DIAGNOSIS — Z7901 Long term (current) use of anticoagulants: Secondary | ICD-10-CM | POA: Diagnosis not present

## 2016-05-22 DIAGNOSIS — E785 Hyperlipidemia, unspecified: Secondary | ICD-10-CM | POA: Diagnosis not present

## 2016-05-22 DIAGNOSIS — I255 Ischemic cardiomyopathy: Secondary | ICD-10-CM | POA: Insufficient documentation

## 2016-05-22 DIAGNOSIS — I495 Sick sinus syndrome: Secondary | ICD-10-CM | POA: Diagnosis not present

## 2016-05-22 DIAGNOSIS — N183 Chronic kidney disease, stage 3 unspecified: Secondary | ICD-10-CM

## 2016-05-22 DIAGNOSIS — I2510 Atherosclerotic heart disease of native coronary artery without angina pectoris: Secondary | ICD-10-CM | POA: Diagnosis not present

## 2016-05-22 DIAGNOSIS — I480 Paroxysmal atrial fibrillation: Secondary | ICD-10-CM | POA: Insufficient documentation

## 2016-05-22 DIAGNOSIS — I5022 Chronic systolic (congestive) heart failure: Secondary | ICD-10-CM | POA: Diagnosis not present

## 2016-05-22 DIAGNOSIS — Z8249 Family history of ischemic heart disease and other diseases of the circulatory system: Secondary | ICD-10-CM | POA: Diagnosis not present

## 2016-05-22 DIAGNOSIS — I130 Hypertensive heart and chronic kidney disease with heart failure and stage 1 through stage 4 chronic kidney disease, or unspecified chronic kidney disease: Secondary | ICD-10-CM | POA: Diagnosis not present

## 2016-05-22 DIAGNOSIS — I25810 Atherosclerosis of coronary artery bypass graft(s) without angina pectoris: Secondary | ICD-10-CM | POA: Diagnosis not present

## 2016-05-22 DIAGNOSIS — I2582 Chronic total occlusion of coronary artery: Secondary | ICD-10-CM | POA: Diagnosis not present

## 2016-05-22 LAB — BRAIN NATRIURETIC PEPTIDE: B Natriuretic Peptide: 606.2 pg/mL — ABNORMAL HIGH (ref 0.0–100.0)

## 2016-05-22 LAB — COMPREHENSIVE METABOLIC PANEL
ALK PHOS: 38 U/L (ref 38–126)
ALT: 13 U/L — ABNORMAL LOW (ref 17–63)
AST: 18 U/L (ref 15–41)
Albumin: 3.7 g/dL (ref 3.5–5.0)
Anion gap: 8 (ref 5–15)
BUN: 60 mg/dL — AB (ref 6–20)
CALCIUM: 10.1 mg/dL (ref 8.9–10.3)
CHLORIDE: 102 mmol/L (ref 101–111)
CO2: 26 mmol/L (ref 22–32)
Creatinine, Ser: 2.38 mg/dL — ABNORMAL HIGH (ref 0.61–1.24)
GFR calc Af Amer: 27 mL/min — ABNORMAL LOW (ref >60)
GFR calc non Af Amer: 23 mL/min — ABNORMAL LOW (ref >60)
GLUCOSE: 97 mg/dL (ref 65–99)
Potassium: 4.7 mmol/L (ref 3.5–5.1)
SODIUM: 136 mmol/L (ref 135–145)
Total Bilirubin: 0.9 mg/dL (ref 0.3–1.2)
Total Protein: 7.9 g/dL (ref 6.5–8.1)

## 2016-05-22 NOTE — Patient Instructions (Signed)
Routine lab work today. Will notify you of abnormal results  Follow up with Dr.McLean in 3 months.  

## 2016-05-22 NOTE — Progress Notes (Signed)
Advanced Heart Failure Clinic Note   Patient ID: Marcus [name], male   DOB: 06-Dec-1928, 83 y.o.   MRN: [id number] EP: Dr Marcus [name]  Cardiology: Marcus [name] is an 83 yo with history of pacemaker for bradycardia, paroxysmal atrial fibrillation, CAD s/p CABG and PCIs, and ischemic cardiomyopathy EF 20-25%. Underwent CRT-D upgrade 10/08/13 due to 85% RV pacing.     Admitted 5/19-5/26/15 with volume overload. He was diuresed with milrinone and IV lasix and discharge weight was 153 lbs. He underwent upgrade of his ICD to CRT-D as well.  He was able to wean off milrinone and did much better after CRT upgrade.  He was admitted in 3/16 with pacemaker pocket infection.  CRT-D device was explanted and temporary-permanent pacemaker with placed.  He later had a St Jude CRT-P system re-implanted.  He was noted to be back in atrial fibrillation and had TEE-guided DCCV to NSR.  TEE showed EF 25-30%.    At a prior appt, he reported increased fatigue and lethargy. RHC in 10/17 showed near-normal filling pressures and low but not markedly low cardiac output.  Coreg was cut back to 6.25 mg bid.  Echo in 10/17 showed improvement in LV function, EF 35-40%.  He is now taking torsemide 20 mg daily.  Weight is down 4 lbs.  He feels better, less fatigue.  No dyspnea walking on flat ground and generally able to do what he wants.  No tachypalpitations.  No lightheadedness, no orthopnea/PND.   Corevue reviewed: Stable thoracic impedance, 96% BiV pacing   Labs (3/16): K 4.1 => 4.6, creatinine 1.87 => 1.97, HCT 34.4, TSH normal, LFTs normal.  Labs (6/16): K 4.2, creatinine 2.34 Labs (10/16): K 4.8, creatinine 2.29 Labs (6/17): K 5.1, creatinine 1.89, AST 30, ALT 25, TSH normal, LDL 159, HCT 37 Labs (10/17): K 5, creatinine 2.45, TSH normal, hgb 12.7, BNP 453 Labs (11/17): LFTs normal Labs (12/17): hgb 11.7, creatinine 2.16  PMH:  1. Symptomatic bradycardia: Medtronic CRT-D.  He developed pocket infection in 3/16 and had  device extracted.  He had St Jude CRT-P system replaced.  2. HTN 3. Hyperlipidemia: Has refused statins.  4. Atrial fibrillation: History of prior DCCV.  Paroxysmal.  On Eliquis.  Has history of cardioembolism to right leg with right popliteal and tibial embolectomy (had been off Eliquis). Has had nausea with amiodarone in the past but now tolerating.  Recurrence in 3/16, had TEE-guided DCCV.  5. Prostate cancer 6. CAD: CABG remotely with SVG-OM and LIMA-LAD.  Late 1990s had PCI to RCA.  In 12/09 had LHC with total occlusion of SVG-OM, patent LIMA-LAD, DES to LCx and DES to RCA.   7. Ischemic cardiomyopathy: Echo (3/15) with EF 20-25%, wall motion abnormalities noted, mild to moderately decreased RV systolic function. ICD upgraded to [place] CRT-D 10/08/13.  TEE (3/16) with EF 25-30%, mildly dilated RV with mildly decreased systolic function, mild to moderate MR.  - RHC (10/17): mean RA 4, PA 44/21 mean 31, unable to wedge, CI 2.0. - Echo (10/17): EF 35-40%, moderate dilation, inferior/inferolateral AK, moderate MR, RV mildly dilated with mildly decreased systolic function.  8. CKD  SH: Married, nonsmoker, lives in [place]  [place]: CAD  ROS: All systems reviewed and negative except as per HPI.   Current Outpatient Prescriptions  Medication Sig Dispense Refill  . amiodarone (PACERONE) 200 MG tablet Take 0.5 tablets (100 mg total) by mouth daily. 30 tablet 3  . Ascorbic Acid (VITAMIN C) 1000 MG tablet Take  1,000 mg by mouth daily.    . carvedilol (COREG) 6.25 MG tablet Take 1 tablet (6.25 mg total) by mouth 2 (two) times daily with a meal.    . Cholecalciferol (VITAMIN D PO) Take 1 capsule by mouth daily.     . Coenzyme Q10 (CO Q 10) 100 MG CAPS Take 1 capsule by mouth 2 (two) times daily.    Marland Kitchen ELIQUIS 2.5 MG TABS tablet TAKE 1 TABLET TWICE A DAY 60 tablet 3  . isosorbide mononitrate (IMDUR) 30 MG 24 hr tablet Take 0.5 tablets (15 mg total) by mouth daily. 45 tablet 3  . Multiple  Vitamins-Minerals (PRESERVISION AREDS 2 PO) Take 1 capsule by mouth 2 (two) times daily.    Marland Kitchen spironolactone (ALDACTONE) 25 MG tablet TAKE ONE-HALF TABLET BY MOUTH ONCE DAILY 45 tablet 3  . torsemide (DEMADEX) 20 MG tablet Take 1 tablet (20 mg total) by mouth daily. 180 tablet 3  . vitamin E 400 UNIT capsule Take 400 Units by mouth daily.     No current facility-administered medications for this encounter.     Vitals:   05/22/16 1133  BP: 112/70  Pulse: 80  SpO2: 100%  Weight: 173 lb 8 oz (78.7 kg)   Wt Readings from Last 3 Encounters:  05/22/16 173 lb 8 oz (78.7 kg)  05/08/16 177 lb 9.6 oz (80.6 kg)  03/26/16 174 lb 12 oz (79.3 kg)    General: NAD, wife present Neck: JVP 6 no thyromegaly or thyroid nodule.  Lungs: CTA CV: Nondisplaced PMI.  Heart regular S1/S2, occasional ectopy 1/6 SEM.   No carotid bruit.  Normal pedal pulses.  Abdomen: Soft, nontender, no hepatosplenomegaly, no distention.  Skin: Intact without lesions or rashes.  Neurologic: Alert and oriented x 3.  Psych: Normal affect. Extremities: No clubbing or cyanosis. Trace ankle edema.  Assessment/Plan:  1. Chronic systolic CHF: Ischemic cardiomyopathy.  EF 25-30% on 3/16 TEE, improved to 35-40% on most recent echo in 10/17.  Has St Jude CRT-P device.  He is feeling better on lower dose of Coreg, BP is higher.  NYHA class II.  Volume looks ok by exam and by Corevue.  Most recently, creatinine was higher.   - Contineu torsemide 20 mg daily.  - Continue current spironolactone 12.5 daily.   - Continue lower dose of Coreg.    - Not on ACEI/ARB with CKD. - BMET today.  2. Atrial fibrillation: Paroxysmal. He is in NSR on amiodarone 100 mg daily. - Check LFTs today.  Needs regular eye exam. - He is on Eliquis 2.5 mg bid (age, elevated creatinine).  3. CAD: Stable. No chest pain. Continue medical management.  - He is not currently on a statin (he has refused to take a statin).   - He is not on ASA given use of Eliquis  and stable CAD.  4. Sick sinus syndrome: s/p CRT-P. 5. CKD, stage III: Baseline creatinine 1.8-1.9.  Higher recently, recheck today.   Followup in 3 months.    Marcus [name] 05/22/2016

## 2016-05-24 ENCOUNTER — Telehealth (HOSPITAL_COMMUNITY): Payer: Self-pay | Admitting: *Deleted

## 2016-05-24 DIAGNOSIS — I5022 Chronic systolic (congestive) heart failure: Secondary | ICD-10-CM

## 2016-05-24 NOTE — Telephone Encounter (Signed)
Notes Recorded by Scarlette Calico, RN on 05/24/2016 at 2:39 PM EST Pt's wife aware, repeat labs sch for 1/12 ------  Notes Recorded by Harvie Junior, [place] on 05/23/2016 at 4:22 PM EST No answer left vm will try patient again tomorrow ------  Notes Recorded by Harvie Junior, [place] on 05/22/2016 at 3:36 PM EST No answer will try again tomorrow ------  Notes Recorded by [name], MD on 05/22/2016 at 2:01 PM EST Creatinine up a bit. Would repeat BMET in 10 days to make sure stable. Can drink a little extra fluid.

## 2016-05-30 ENCOUNTER — Ambulatory Visit (INDEPENDENT_AMBULATORY_CARE_PROVIDER_SITE_OTHER): Payer: Medicare Other | Admitting: *Deleted

## 2016-05-30 DIAGNOSIS — I495 Sick sinus syndrome: Secondary | ICD-10-CM

## 2016-05-30 DIAGNOSIS — I5022 Chronic systolic (congestive) heart failure: Secondary | ICD-10-CM | POA: Diagnosis not present

## 2016-05-30 DIAGNOSIS — Z950 Presence of cardiac pacemaker: Secondary | ICD-10-CM | POA: Diagnosis not present

## 2016-05-30 NOTE — Progress Notes (Signed)
Remote pacemaker transmission.   

## 2016-05-31 ENCOUNTER — Encounter: Payer: Self-pay | Admitting: Cardiology

## 2016-06-01 ENCOUNTER — Ambulatory Visit (HOSPITAL_COMMUNITY)
Admission: RE | Admit: 2016-06-01 | Discharge: 2016-06-01 | Disposition: A | Discharge status: Home / Self Care | Payer: Medicare Other | Source: Ambulatory Visit | Attending: Internal Medicine | Admitting: Internal Medicine

## 2016-06-01 DIAGNOSIS — I5022 Chronic systolic (congestive) heart failure: Secondary | ICD-10-CM | POA: Insufficient documentation

## 2016-06-01 LAB — BASIC METABOLIC PANEL
Anion gap: 12 (ref 5–15)
BUN: 78 mg/dL — AB (ref 6–20)
CHLORIDE: 100 mmol/L — AB (ref 101–111)
CO2: 24 mmol/L (ref 22–32)
Calcium: 9.5 mg/dL (ref 8.9–10.3)
Creatinine, Ser: 2.78 mg/dL — ABNORMAL HIGH (ref 0.61–1.24)
GFR calc Af Amer: 22 mL/min — ABNORMAL LOW (ref >60)
GFR calc non Af Amer: 19 mL/min — ABNORMAL LOW (ref >60)
GLUCOSE: 108 mg/dL — AB (ref 65–99)
POTASSIUM: 4.5 mmol/L (ref 3.5–5.1)
Sodium: 136 mmol/L (ref 135–145)

## 2016-06-04 ENCOUNTER — Telehealth (HOSPITAL_COMMUNITY): Payer: Self-pay | Admitting: *Deleted

## 2016-06-04 DIAGNOSIS — I5022 Chronic systolic (congestive) heart failure: Secondary | ICD-10-CM

## 2016-06-04 MED ORDER — TORSEMIDE 20 MG PO TABS: ORAL_TABLET | ORAL | 3 refills | Status: DC

## 2016-06-04 NOTE — Telephone Encounter (Signed)
Notes Recorded by Kennieth Rad, RN on 06/04/2016 at 9:21 AM EST Called and spoke with patient's wife and she is agreeable with plan and will discuss with patient. I have added her to lab schedule and placed orders in epic. ------  Notes Recorded by [name], MD on 06/02/2016 at 2:32 PM EST Creatinine higher, hold torsemide x 4 days then decrease to 20 mg every other day. BMET in 10 days.

## 2016-06-04 NOTE — Progress Notes (Signed)
EPIC Encounter for ICM Monitoring  Patient Name: Marcus [name] is a 83 y.o. male Date: 06/04/2016 Primary Care Physican: No PCP Per Patient Primary Cardiologist:Bensimhon/McLean Electrophysiologist: Lovena Le Dry Weight:unknown Bi-V Pacing: 96%      Spoke with wife.  Heart Failure questions reviewed, pt asymptomatic.   Thoracic impedance normal.    Labs: Next Labs 06/14/2016 06/01/2016 Creatinine 2.78, BUN 78, Potassium 4.5, Sodium 136, EGFR 19-22 Torsemide held x 4 days then decrease to 20 mg every other day per lab note due to increase Creatinine. 05/22/2016 Creatinine 2.38, BUN 60, Potassium 4.7, Sodium 136, EGFR 23-27 05/08/2016 Creatinine 2.16, BUN 40, Potassium 4.5, Sodium 143, EGFR 26-30 03/26/2016 Creatinine 1.77, BUN 26, Potassium 4.3, Sodium 140, EGFR 33-39 02/21/2016 Creatinine 2.45, BUN 75, Potassium 5.0, Sodium 138, EGFR 23-26  02/23/2015 Creatinine 2.29, BUN 49, Potassium 4.8, Sodium 137, EGFR 25-29   Recommendations: No changes.   Follow-up plan: ICM clinic phone appointment on 06/12/2016 before next BMET on 06/14/2016.   Copy of ICM check sent to primary cardiologist and device physician.   3 month ICM trend: 06/04/2016   1 Year ICM trend:      Rosalene Billings, RN 06/04/2016 1:53 PM

## 2016-06-12 ENCOUNTER — Ambulatory Visit (INDEPENDENT_AMBULATORY_CARE_PROVIDER_SITE_OTHER): Payer: Medicare Other

## 2016-06-12 DIAGNOSIS — I5022 Chronic systolic (congestive) heart failure: Secondary | ICD-10-CM

## 2016-06-12 DIAGNOSIS — Z950 Presence of cardiac pacemaker: Secondary | ICD-10-CM

## 2016-06-14 ENCOUNTER — Ambulatory Visit (HOSPITAL_COMMUNITY)
Admission: RE | Admit: 2016-06-14 | Discharge: 2016-06-14 | Disposition: A | Discharge status: Home / Self Care | Payer: Medicare Other | Source: Ambulatory Visit | Attending: Cardiology | Admitting: Cardiology

## 2016-06-14 DIAGNOSIS — I5022 Chronic systolic (congestive) heart failure: Secondary | ICD-10-CM | POA: Diagnosis not present

## 2016-06-14 LAB — BASIC METABOLIC PANEL
Anion gap: 11 (ref 5–15)
BUN: 59 mg/dL — AB (ref 6–20)
CALCIUM: 9.8 mg/dL (ref 8.9–10.3)
CO2: 23 mmol/L (ref 22–32)
CREATININE: 2.54 mg/dL — AB (ref 0.61–1.24)
Chloride: 103 mmol/L (ref 101–111)
GFR calc non Af Amer: 22 mL/min — ABNORMAL LOW (ref >60)
GFR, EST AFRICAN AMERICAN: 25 mL/min — AB (ref >60)
Glucose, Bld: 95 mg/dL (ref 65–99)
Potassium: 4.9 mmol/L (ref 3.5–5.1)
SODIUM: 137 mmol/L (ref 135–145)

## 2016-06-14 NOTE — Progress Notes (Signed)
EPIC Encounter for ICM Monitoring  Patient Name: Marcus [name] is a 83 y.o. male Date: 06/14/2016 Primary Care Physican: No PCP Per Patient Primary Cardiologist:Bensimhon/McLean Electrophysiologist: Lovena Le Dry Weight:unknown Bi-V Pacing: 96%          Spoke with wife. Heart Failure questions reviewed, pt is having a little more energy.  Thoracic impedance returned to normal and slightly above baseline.    Taking Torsemide 20 mg every other day as instructed.   Labs:  06/14/2016 Creatinine 2.54. BUN 59, Potassium 4.9, Sodium 137, EGFR 22-25 06/01/2016 Creatinine 2.78, BUN 78, Potassium 4.5, Sodium 136, EGFR 19-22 Torsemide held x 4 days then decrease to 20 mg every other day per lab note due to increase Creatinine. 05/22/2016 Creatinine 2.38, BUN 60, Potassium 4.7, Sodium 136, EGFR 23-27 05/08/2016 Creatinine 2.16, BUN 40, Potassium 4.5, Sodium 143, EGFR 26-30 03/26/2016 Creatinine 1.77, BUN 26, Potassium 4.3, Sodium 140, EGFR 33-39 02/21/2016 Creatinine 2.45, BUN 75, Potassium 5.0, Sodium 138, EGFR 23-26  10/05/2016Creatinine 2.29, BUN 49, Potassium 4.8, Sodium 137, EGFR 25-29   Recommendations: No changes. Discussed sodium amounts in foods.  He has been decreasing amount of salt and sweets.   Encouraged to call for fluid symptoms.  Follow-up plan: ICM clinic phone appointment on 06/26/2016.  Copy of ICM check sent to primary cardiologist and device physician.   3 month ICM trend: 06/12/2016   1 Year ICM trend:      Rosalene Billings, RN 06/14/2016 2:51 PM

## 2016-06-18 LAB — CUP PACEART REMOTE DEVICE CHECK
Brady Statistic RA Percent Paced: 98 %
Date Time Interrogation Session: 20180129162456
Implantable Lead Implant Date: 20160304
Implantable Lead Implant Date: 20160304
Implantable Lead Implant Date: 20160304
Implantable Lead Location: 753858
Implantable Lead Location: 753859
Implantable Lead Location: 753860
Implantable Pulse Generator Implant Date: 20160304
Lead Channel Impedance Value: 530 Ohm
Lead Channel Pacing Threshold Amplitude: 1 V
Lead Channel Pacing Threshold Amplitude: 1 V
Lead Channel Pacing Threshold Pulse Width: 0.5 ms
Lead Channel Pacing Threshold Pulse Width: 0.6 ms
Lead Channel Sensing Intrinsic Amplitude: 12 mV
MDC IDC MSMT LEADCHNL LV IMPEDANCE VALUE: 930 Ohm
MDC IDC MSMT LEADCHNL RA IMPEDANCE VALUE: 490 Ohm
MDC IDC MSMT LEADCHNL RA SENSING INTR AMPL: 1.5 mV
MDC IDC STAT BRADY RV PERCENT PACED: 96 %
Pulse Gen Serial Number: 7725037

## 2016-06-26 ENCOUNTER — Ambulatory Visit (INDEPENDENT_AMBULATORY_CARE_PROVIDER_SITE_OTHER): Payer: Medicare Other

## 2016-06-26 DIAGNOSIS — I5022 Chronic systolic (congestive) heart failure: Secondary | ICD-10-CM

## 2016-06-26 DIAGNOSIS — Z950 Presence of cardiac pacemaker: Secondary | ICD-10-CM

## 2016-06-28 NOTE — Progress Notes (Signed)
EPIC Encounter for ICM Monitoring  Patient Name: Marcus [name] is a 83 y.o. male Date: 06/28/2016 Primary Care Physican: No PCP Per Patient Primary Cardiologist:Bensimhon/McLean Electrophysiologist: Lovena Le Dry Weight:160 lb Bi-V Pacing: 96%      Spoke with wife.  Heart Failure questions reviewed, pt asymptomatic   Thoracic impedance normal   Labs:  06/14/2016 Creatinine 2.54. BUN 59, Potassium 4.9, Sodium 137, EGFR 22-25 06/01/2016 Creatinine 2.78, BUN 78, Potassium 4.5, Sodium 136, EGFR 19-22 Torsemide held x 4 days then decrease to 20 mg every other day per lab note due to increase Creatinine. 05/22/2016 Creatinine 2.38, BUN 60, Potassium 4.7, Sodium 136, EGFR 23-27 05/08/2016 Creatinine 2.16, BUN 40, Potassium 4.5, Sodium 143, EGFR 26-30 03/26/2016 Creatinine 1.77, BUN 26, Potassium 4.3, Sodium 140, EGFR 33-39 02/21/2016 Creatinine 2.45, BUN 75, Potassium 5.0, Sodium 138, EGFR 23-26  10/05/2016Creatinine 2.29, BUN 49, Potassium 4.8, Sodium 137, EGFR 25-29   Recommendations: No changes. Reminded to limit dietary salt intake to 2000 mg/day and fluid intake to < 2 liters/day. Encouraged to call for fluid symptoms.  Follow-up plan: ICM clinic phone appointment on 07/31/2016.  Copy of ICM check sent to device physician.   3 month ICM trend: 06/26/2016   1 Year ICM trend:      Rosalene Billings, RN 06/28/2016 1:20 PM

## 2016-07-01 ENCOUNTER — Other Ambulatory Visit (HOSPITAL_COMMUNITY): Payer: Self-pay | Admitting: Internal Medicine

## 2016-07-02 NOTE — Telephone Encounter (Signed)
Refill request for Eliquis 2.5mg  sent to pharmacy; per dosing criteria pt is 83 years old, Cre-2.54 on 06/14/16, wt-79.4kg, last saw [facility] on 05/22/16 & will follow up in March.

## 2016-07-19 DIAGNOSIS — H2513 Age-related nuclear cataract, bilateral: Secondary | ICD-10-CM | POA: Diagnosis not present

## 2016-07-19 DIAGNOSIS — H353133 Nonexudative age-related macular degeneration, bilateral, advanced atrophic without subfoveal involvement: Secondary | ICD-10-CM | POA: Diagnosis not present

## 2016-07-31 ENCOUNTER — Ambulatory Visit (INDEPENDENT_AMBULATORY_CARE_PROVIDER_SITE_OTHER): Payer: Medicare Other

## 2016-07-31 DIAGNOSIS — I5022 Chronic systolic (congestive) heart failure: Secondary | ICD-10-CM

## 2016-07-31 DIAGNOSIS — Z950 Presence of cardiac pacemaker: Secondary | ICD-10-CM

## 2016-07-31 NOTE — Progress Notes (Signed)
EPIC Encounter for ICM Monitoring  Patient Name: Marcus [name] is a 83 y.o. male Date: 07/31/2016 Primary Care Physican: No PCP Per Patient Primary Cardiologist:Bensimhon/McLean Electrophysiologist: Lovena Le Dry Weight: unknown Bi-V Pacing: 96%               Transmission reviewed.    Thoracic impedance normal   Labs:  06/14/2016 Creatinine 2.54. BUN 59, Potassium 4.9, Sodium 137, EGFR 22-25 06/01/2016 Creatinine 2.78, BUN 78, Potassium 4.5, Sodium 136, EGFR 19-22 Torsemide held x 4 days then decrease to 20 mg every other day per lab note due to increase Creatinine. 05/22/2016 Creatinine 2.38, BUN 60, Potassium 4.7, Sodium 136, EGFR 23-27 05/08/2016 Creatinine 2.16, BUN 40, Potassium 4.5, Sodium 143, EGFR 26-30 03/26/2016 Creatinine 1.77, BUN 26, Potassium 4.3, Sodium 140, EGFR 33-39 02/21/2016 Creatinine 2.45, BUN 75, Potassium 5.0, Sodium 138, EGFR 23-26  10/05/2016Creatinine 2.29, BUN 49, Potassium 4.8, Sodium 137, EGFR 25-29   Recommendations: None  Follow-up plan: ICM clinic phone appointment on 09/03/2016.  Copy of ICM check sent to device physician.   3 month ICM trend: 07/31/2016   1 Year ICM trend:      Rosalene Billings, RN 07/31/2016 6:35 PM

## 2016-09-03 ENCOUNTER — Telehealth: Payer: Self-pay

## 2016-09-03 ENCOUNTER — Ambulatory Visit (INDEPENDENT_AMBULATORY_CARE_PROVIDER_SITE_OTHER): Payer: Medicare Other

## 2016-09-03 DIAGNOSIS — Z950 Presence of cardiac pacemaker: Secondary | ICD-10-CM | POA: Diagnosis not present

## 2016-09-03 DIAGNOSIS — I5022 Chronic systolic (congestive) heart failure: Secondary | ICD-10-CM | POA: Diagnosis not present

## 2016-09-03 NOTE — Telephone Encounter (Signed)
Remote ICM transmission received.  Attempted patient call and left message to return call.   

## 2016-09-03 NOTE — Progress Notes (Signed)
Wife returned call.  Reviewed transmission and advised patient to limit salt and fluid intake.  She thinks he may be eating foods with salt when she is not at work.  She stated he generally feels very tired but does not complain of any specific symptoms.    Advised he is due to have yearly office check with Dr Lovena Le and she will call to make an appointment.  Encouraged to call for any fluid symptoms.

## 2016-09-03 NOTE — Progress Notes (Addendum)
EPIC Encounter for ICM Monitoring  Patient Name: Marcus [name] is a 83 y.o. male Date: 09/03/2016 Primary Care Physican: No PCP Per Patient Primary Cardiologist:McLean Electrophysiologist: Lovena Le Dry Weight: unknown Bi-V Pacing: 96%            Attempted call to patient and unable to reach.  Left message to return call.  Transmission reviewed.    Thoracic impedance normal but was abnormal suggesting fluid accumulation from 08/26/2016 to 09/01/2016.  Prescribed dosage: Torsemide 20 mg 1 tablet every other day.  Labs:  06/14/2016 Creatinine 2.54. BUN 59, Potassium 4.9, Sodium 137, EGFR 22-25 06/01/2016 Creatinine 2.78, BUN 78, Potassium 4.5, Sodium 136, EGFR 19-22 Torsemide held x 4 days then decrease to 20 mg every other day per lab note due to increase Creatinine. 05/22/2016 Creatinine 2.38, BUN 60, Potassium 4.7, Sodium 136, EGFR 23-27 05/08/2016 Creatinine 2.16, BUN 40, Potassium 4.5, Sodium 143, EGFR 26-30 03/26/2016 Creatinine 1.77, BUN 26, Potassium 4.3, Sodium 140, EGFR 33-39 02/21/2016 Creatinine 2.45, BUN 75, Potassium 5.0, Sodium 138, EGFR 23-26  10/05/2016Creatinine 2.29, BUN 49, Potassium 4.8, Sodium 137, EGFR 25-29   Recommendations: NONE - Unable to reach patient   Follow-up plan: ICM clinic phone appointment on 10/04/2016.  Due to make an appointment with Dr Lovena Le for March.  Copy of ICM check sent to device physician.   3 month ICM trend: 09/03/2016   1 Year ICM trend:      Rosalene Billings, RN 09/03/2016 10:12 AM

## 2016-09-09 ENCOUNTER — Other Ambulatory Visit (HOSPITAL_COMMUNITY): Payer: Self-pay | Admitting: Cardiology

## 2016-09-10 ENCOUNTER — Other Ambulatory Visit (HOSPITAL_COMMUNITY): Payer: Self-pay | Admitting: *Deleted

## 2016-09-10 MED ORDER — CARVEDILOL 6.25 MG PO TABS: 6.2500 mg | ORAL_TABLET | Freq: Two times a day (BID) | ORAL | 2 refills | Status: DC

## 2016-09-11 DIAGNOSIS — H2513 Age-related nuclear cataract, bilateral: Secondary | ICD-10-CM | POA: Diagnosis not present

## 2016-09-12 ENCOUNTER — Other Ambulatory Visit (HOSPITAL_COMMUNITY): Payer: Self-pay | Admitting: *Deleted

## 2016-09-17 ENCOUNTER — Encounter: Payer: Self-pay | Admitting: *Deleted

## 2016-09-18 ENCOUNTER — Encounter: Payer: Self-pay | Admitting: *Deleted

## 2016-09-19 IMAGING — CR DG CHEST 1V PORT
1 series · 1 of 1 positions shown · non-contrast
Comparison: 03/24/2014

CLINICAL DATA: Pacemaker lead removal/extraction, central line
placement

EXAM:
PORTABLE CHEST - 1 VIEW

[AP]
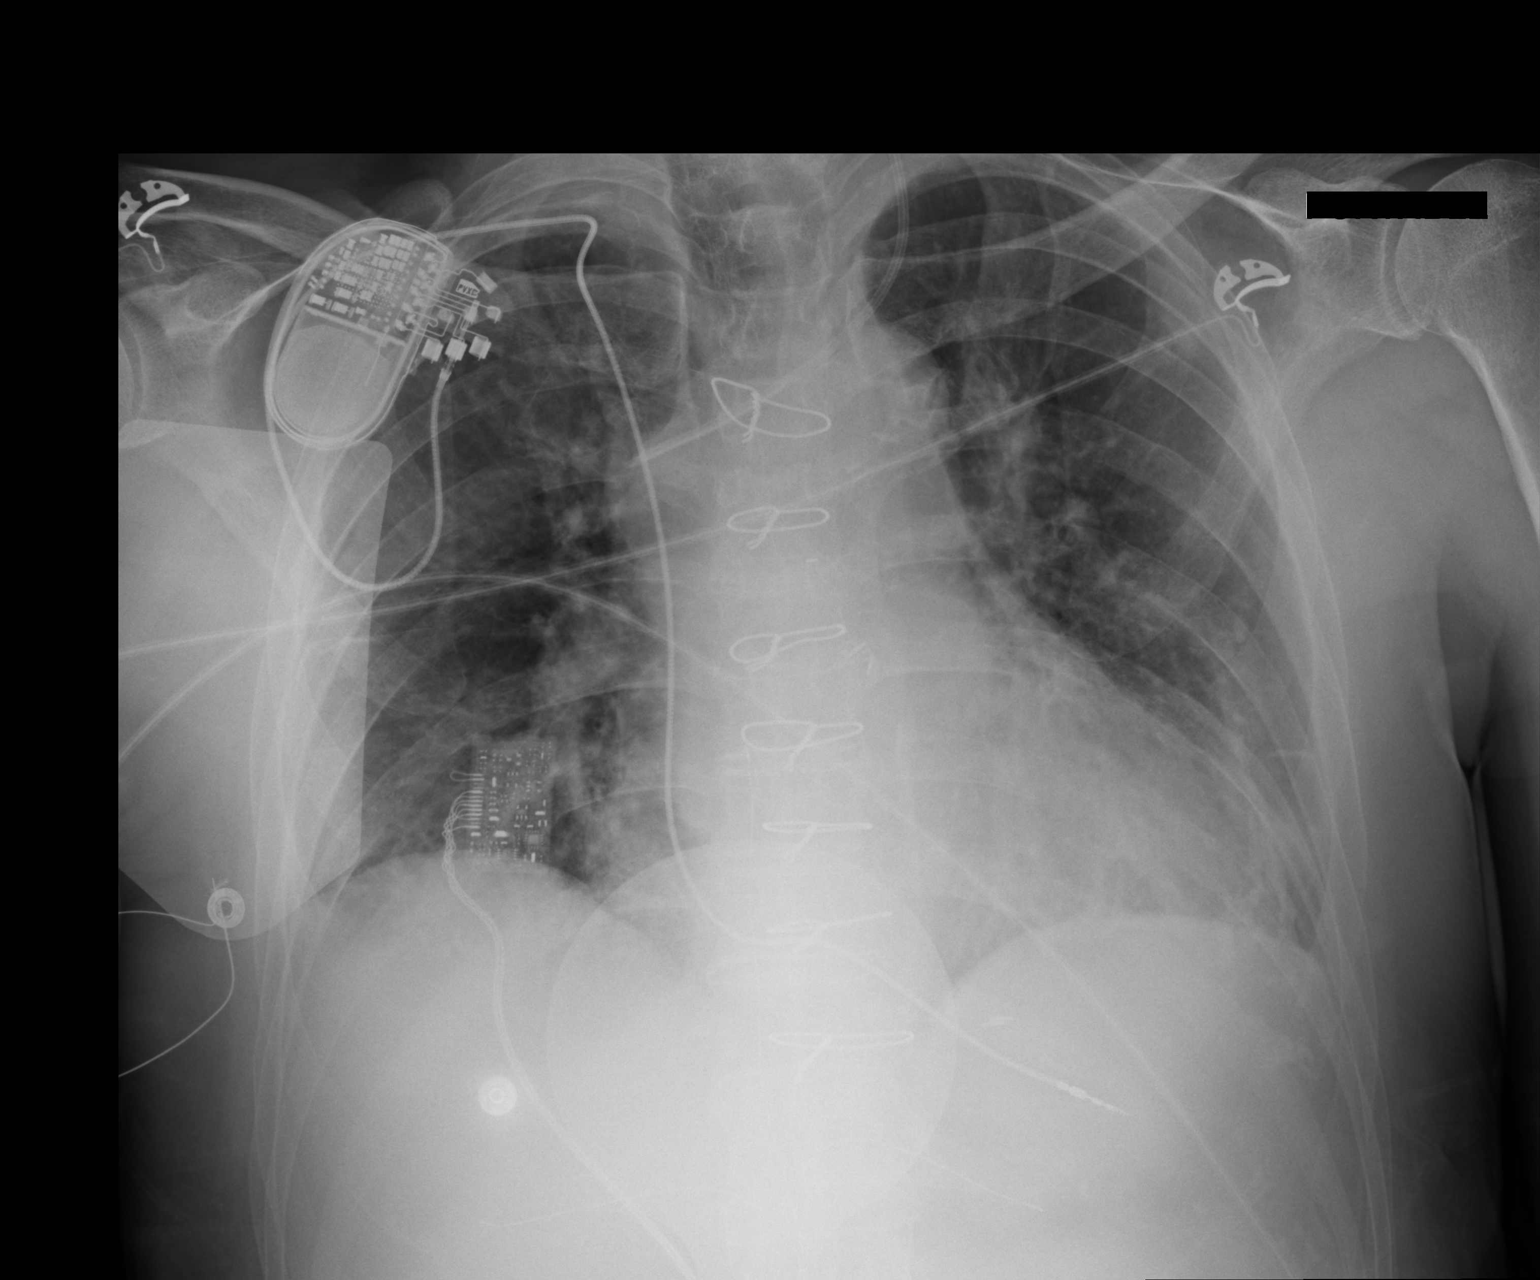

[1 of 1 positions shown; findings below may reference images not displayed]

FINDINGS: Right-sided pacer in place with single lead terminating over the
expected location of the right ventricle. Left IJ central venous
catheter tip terminates at the brachiocephalic/SVC junction.
Evidence of median sternotomy. Heart size at upper limits of normal.
Lungs are hypoaerated with crowding of the bronchovascular markings
and curvilinear bibasilar atelectasis. Trace bilateral pleural
effusions are present. No acute osseous abnormality. No
pneumothorax.
IMPRESSION: Low volumes with probable bibasilar atelectasis. Consider PA and
lateral chest radiographs obtained at full inspiration when the
patient is clinically able.

Left IJ central line tip terminating at the brachiocephalic/ SVC
junction.

## 2016-09-20 IMAGING — DX DG CHEST 2V
2 series · 2 of 2 positions shown · non-contrast
Comparison: Portable chest x-ray July 19, 2014 and March 24, 2014.

CLINICAL DATA: Status post pacemaker placement

EXAM:
CHEST  2 VIEW

[chest lat]
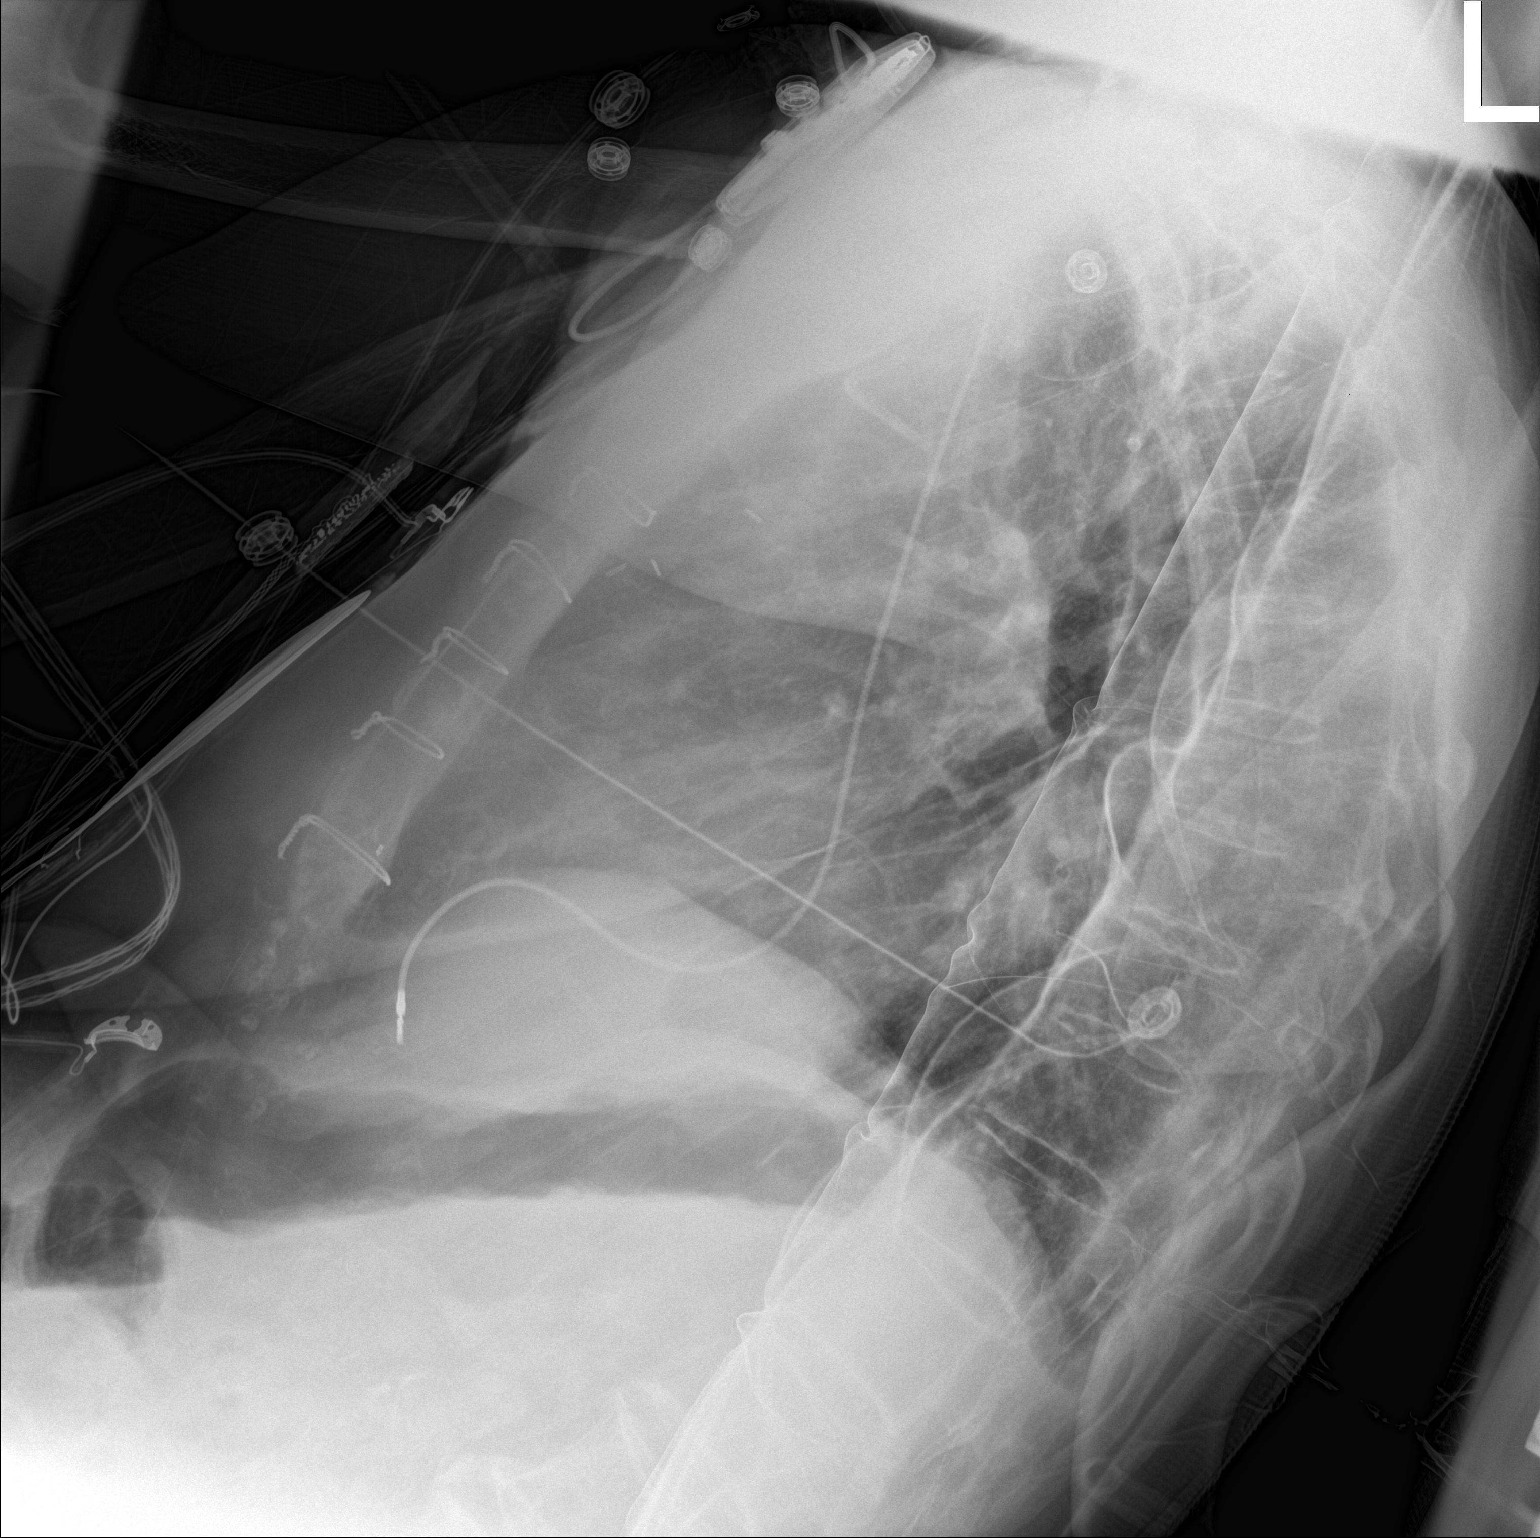

[chest ap]
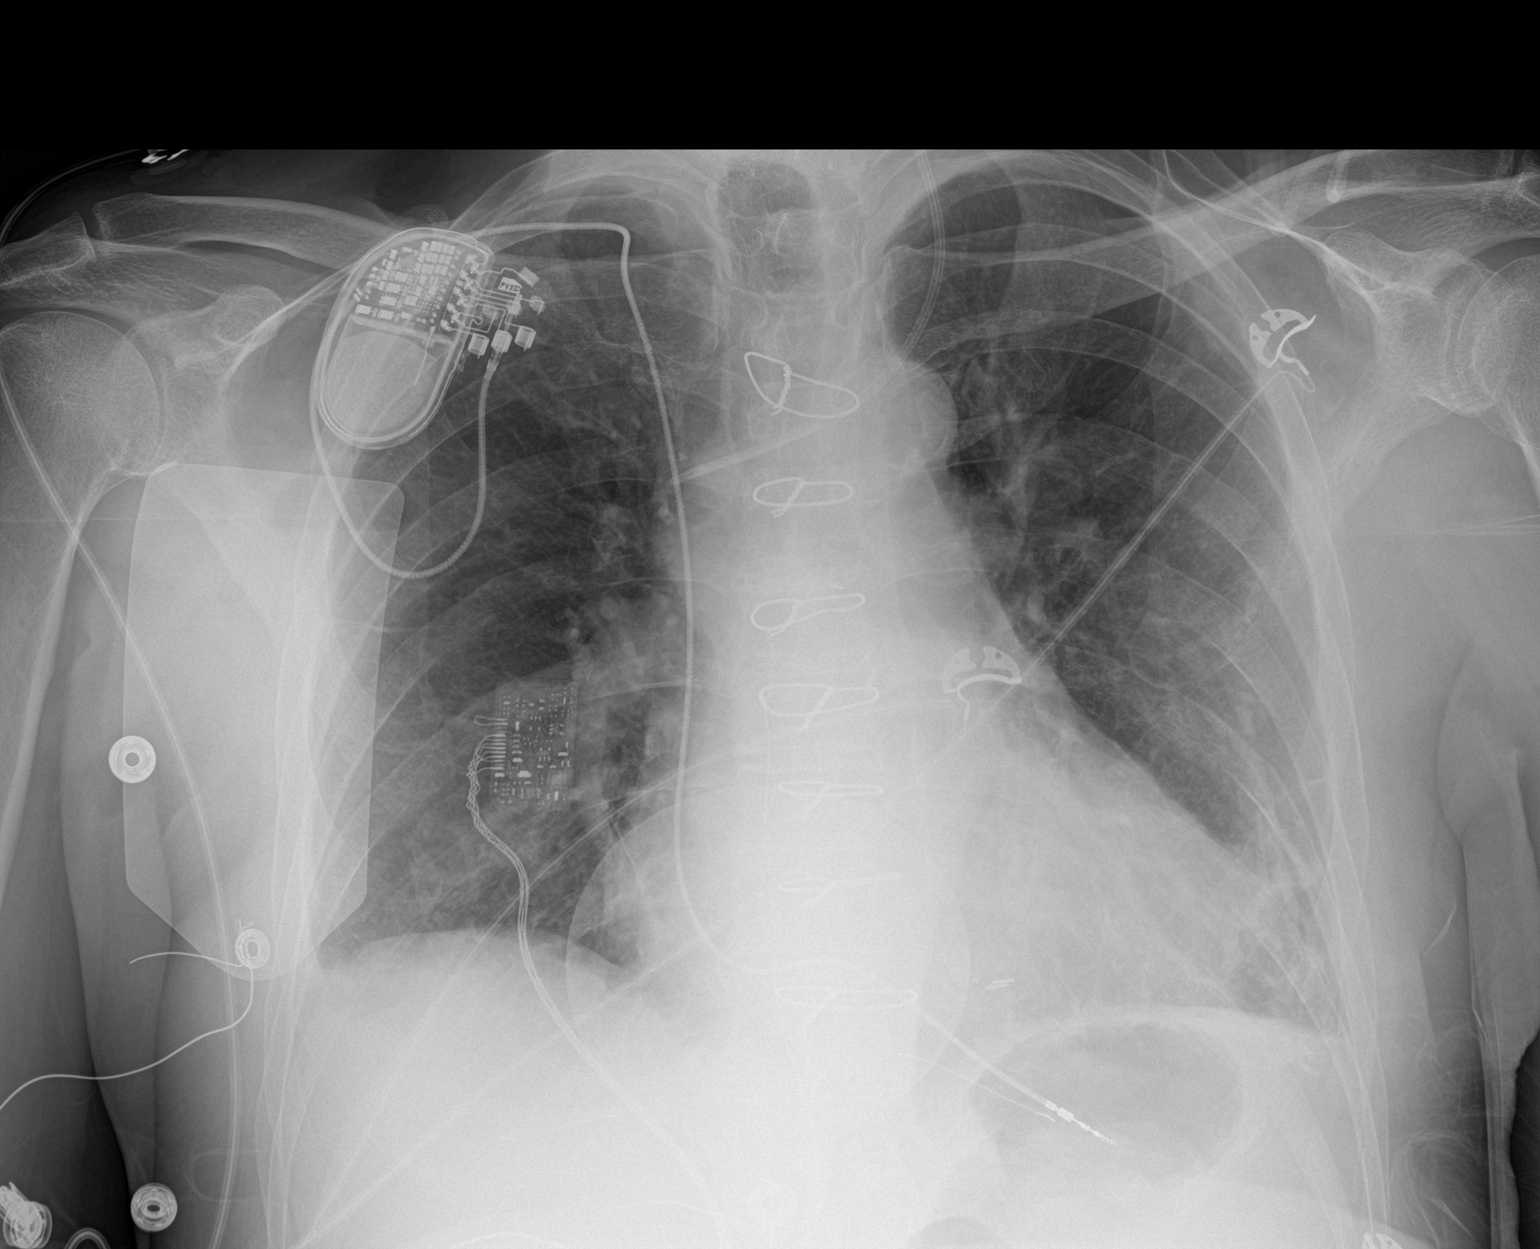

[2 of 2 positions shown; findings below may reference images not displayed]

FINDINGS: The lungs are well-expanded. There is no right-sided pneumothorax or
hemothorax. There is chronic scarring of the pleura at the left lung
base laterally and there is chronic blunting of the right lateral
costophrenic angle. The cardiac silhouette is mildly enlarged. The
pulmonary vascularity is normal. The permanent pacemaker electrodes
are in reasonable position radiographically. The left internal
jugular venous catheter tip projects over the proximal SVC. There
are 7 intact sternal wires from previous median sternotomy. The bony
thorax exhibits no acute abnormality.
IMPRESSION: There is no postprocedure complication following permanent pacemaker
placement. The support tubes and lines are in appropriate position.

## 2016-09-20 NOTE — Discharge Instructions (Signed)
Cataract Surgery, Care After °Refer to this sheet in the next few weeks. These instructions provide you with information about caring for yourself after your procedure. Your health care provider may also give you more specific instructions. Your treatment has been planned according to current medical practices, but problems sometimes occur. Call your health care provider if you have any problems or questions after your procedure. °What can I expect after the procedure? °After the procedure, it is common to have: °· Itching. °· Discomfort. °· Fluid discharge. °· Sensitivity to light and to touch. °· Bruising. °Follow these instructions at home: °Eye Care  °· Check your eye every day for signs of infection. Watch for: °¨ Redness, swelling, or pain. °¨ Fluid, blood, or pus. °¨ Warmth. °¨ Bad smell. °Activity  °· Avoid strenuous activities, such as playing contact sports, for as long as told by your health care provider. °· Do not drive or operate heavy machinery until your health care provider approves. °· Do not bend or lift heavy objects . Bending increases pressure in the eye. You can walk, climb stairs, and do light household chores. °· Ask your health care provider when you can return to work. If you work in a dusty environment, you may be advised to wear protective eyewear for a period of time. °General instructions  °· Take or apply over-the-counter and prescription medicines only as told by your health care provider. This includes eye drops. °· Do not touch or rub your eyes. °· If you were given a protective shield, wear it as told by your health care provider. If you were not given a protective shield, wear sunglasses as told by your health care provider to protect your eyes. °· Keep the area around your eye clean and dry. Avoid swimming or allowing water to hit you directly in the face while showering until told by your health care provider. Keep soap and shampoo out of your eyes. °· Do not put a contact lens  into the affected eye or eyes until your health care provider approves. °· Keep all follow-up visits as told by your health care provider. This is important. °Contact a health care provider if: ° °· You have increased bruising around your eye. °· You have pain that is not helped with medicine. °· You have a fever. °· You have redness, swelling, or pain in your eye. °· You have fluid, blood, or pus coming from your incision. °· Your vision gets worse. °Get help right away if: °· You have sudden vision loss. °This information is not intended to replace advice given to you by your health care provider. Make sure you discuss any questions you have with your health care provider. °Document Released: 11/24/2004 Document Revised: 09/15/2015 Document Reviewed: 03/17/2015 °Elsevier Interactive Patient Education © 2017 [employer] ° ° ° ° °General Anesthesia, Adult, Care After °These instructions provide you with information about caring for yourself after your procedure. Your health care provider may also give you more specific instructions. Your treatment has been planned according to current medical practices, but problems sometimes occur. Call your health care provider if you have any problems or questions after your procedure. °What can I expect after the procedure? °After the procedure, it is common to have: °· Vomiting. °· A sore throat. °· Mental slowness. °It is common to feel: °· Nauseous. °· Cold or shivery. °· Sleepy. °· Tired. °· Sore or achy, even in parts of your body where you did not have surgery. °Follow these instructions at   home: °For at least 24 hours after the procedure:  °· Do not: °¨ Participate in activities where you could fall or become injured. °¨ Drive. °¨ Use heavy machinery. °¨ Drink alcohol. °¨ Take sleeping pills or medicines that cause drowsiness. °¨ Make important decisions or sign legal documents. °¨ Take care of children on your own. °· Rest. °Eating and drinking  °· If you vomit, drink  water, juice, or soup when you can drink without vomiting. °· Drink enough fluid to keep your urine clear or pale yellow. °· Make sure you have little or no nausea before eating solid foods. °· Follow the diet recommended by your health care provider. °General instructions  °· Have a responsible adult stay with you until you are awake and alert. °· Return to your normal activities as told by your health care provider. Ask your health care provider what activities are safe for you. °· Take over-the-counter and prescription medicines only as told by your health care provider. °· If you smoke, do not smoke without supervision. °· Keep all follow-up visits as told by your health care provider. This is important. °Contact a health care provider if: °· You continue to have nausea or vomiting at home, and medicines are not helpful. °· You cannot drink fluids or start eating again. °· You cannot urinate after 8-12 hours. °· You develop a skin rash. °· You have fever. °· You have increasing redness at the site of your procedure. °Get help right away if: °· You have difficulty breathing. °· You have chest pain. °· You have unexpected bleeding. °· You feel that you are having a life-threatening or urgent problem. °This information is not intended to replace advice given to you by your health care provider. Make sure you discuss any questions you have with your health care provider. °Document Released: 08/13/2000 Document Revised: 10/10/2015 Document Reviewed: 04/21/2015 °Elsevier Interactive Patient Education © 2017 [employer] ° °

## 2016-09-24 ENCOUNTER — Encounter
Admission: RE | Disposition: A | Discharge status: Home / Self Care | Payer: Self-pay | Source: Ambulatory Visit | Attending: Ophthalmology

## 2016-09-24 ENCOUNTER — Ambulatory Visit: Payer: Medicare Other | Admitting: Anesthesiology

## 2016-09-24 ENCOUNTER — Other Ambulatory Visit (HOSPITAL_COMMUNITY): Payer: Self-pay | Admitting: Cardiology

## 2016-09-24 ENCOUNTER — Ambulatory Visit
Admission: RE | Admit: 2016-09-24 | Discharge: 2016-09-24 | Disposition: A | Discharge status: Home / Self Care | Payer: Medicare Other | Source: Ambulatory Visit | Attending: Ophthalmology | Admitting: Ophthalmology

## 2016-09-24 DIAGNOSIS — I110 Hypertensive heart disease with heart failure: Secondary | ICD-10-CM | POA: Insufficient documentation

## 2016-09-24 DIAGNOSIS — Z87891 Personal history of nicotine dependence: Secondary | ICD-10-CM | POA: Diagnosis not present

## 2016-09-24 DIAGNOSIS — I739 Peripheral vascular disease, unspecified: Secondary | ICD-10-CM | POA: Insufficient documentation

## 2016-09-24 DIAGNOSIS — Z950 Presence of cardiac pacemaker: Secondary | ICD-10-CM | POA: Diagnosis not present

## 2016-09-24 DIAGNOSIS — I480 Paroxysmal atrial fibrillation: Secondary | ICD-10-CM

## 2016-09-24 DIAGNOSIS — N289 Disorder of kidney and ureter, unspecified: Secondary | ICD-10-CM | POA: Diagnosis not present

## 2016-09-24 DIAGNOSIS — H2512 Age-related nuclear cataract, left eye: Secondary | ICD-10-CM | POA: Diagnosis not present

## 2016-09-24 DIAGNOSIS — I509 Heart failure, unspecified: Secondary | ICD-10-CM | POA: Diagnosis not present

## 2016-09-24 DIAGNOSIS — I2510 Atherosclerotic heart disease of native coronary artery without angina pectoris: Secondary | ICD-10-CM | POA: Insufficient documentation

## 2016-09-24 DIAGNOSIS — I5023 Acute on chronic systolic (congestive) heart failure: Secondary | ICD-10-CM

## 2016-09-24 DIAGNOSIS — H2513 Age-related nuclear cataract, bilateral: Secondary | ICD-10-CM | POA: Diagnosis not present

## 2016-09-24 HISTORY — PX: CATARACT EXTRACTION W/PHACO: SHX586

## 2016-09-24 HISTORY — DX: Presence of cardiac pacemaker: Z95.0

## 2016-09-24 HISTORY — DX: Acute embolism and thrombosis of unspecified deep veins of unspecified lower extremity: I82.409

## 2016-09-24 SURGERY — PHACOEMULSIFICATION, CATARACT, WITH IOL INSERTION
Anesthesia: Monitor Anesthesia Care | Site: Eye | Laterality: Left | Wound class: Clean

## 2016-09-24 MED ORDER — MOXIFLOXACIN HCL 0.5 % OP SOLN
1.0000 [drp] | OPHTHALMIC | Status: DC | PRN
  Administered 2016-09-24 (×3): 1 [drp] via OPHTHALMIC

## 2016-09-24 MED ORDER — BRIMONIDINE TARTRATE-TIMOLOL 0.2-0.5 % OP SOLN
OPHTHALMIC | Status: DC | PRN
  Administered 2016-09-24: 1 [drp] via OPHTHALMIC

## 2016-09-24 MED ORDER — FENTANYL CITRATE (PF) 100 MCG/2ML IJ SOLN
INTRAMUSCULAR | Status: DC | PRN
  Administered 2016-09-24: 50 ug via INTRAVENOUS

## 2016-09-24 MED ORDER — CEFUROXIME OPHTHALMIC INJECTION 1 MG/0.1 ML
INJECTION | OPHTHALMIC | Status: DC | PRN
  Administered 2016-09-24: .3 mL via OPHTHALMIC

## 2016-09-24 MED ORDER — LIDOCAINE HCL (PF) 2 % IJ SOLN
INTRAOCULAR | Status: DC | PRN
  Administered 2016-09-24: 1 mL via INTRAOCULAR

## 2016-09-24 MED ORDER — NA HYALUR & NA CHOND-NA HYALUR 0.4-0.35 ML IO KIT
PACK | INTRAOCULAR | Status: DC | PRN
  Administered 2016-09-24: 1 mL via INTRAOCULAR

## 2016-09-24 MED ORDER — MIDAZOLAM HCL 2 MG/2ML IJ SOLN
INTRAMUSCULAR | Status: DC | PRN
Start: 2016-09-24 — End: 2016-09-24
  Administered 2016-09-24: 1 mg via INTRAVENOUS

## 2016-09-24 MED ORDER — ARMC OPHTHALMIC DILATING DROPS
1.0000 "application " | OPHTHALMIC | Status: DC | PRN
  Administered 2016-09-24 (×3): 1 via OPHTHALMIC

## 2016-09-24 MED ORDER — AMIODARONE HCL 200 MG PO TABS: 100.0000 mg | ORAL_TABLET | Freq: Every day | ORAL | 3 refills | Status: DC

## 2016-09-24 MED ORDER — LACTATED RINGERS IV SOLN: INTRAVENOUS | Status: DC

## 2016-09-24 MED ORDER — BSS IO SOLN
INTRAOCULAR | Status: DC | PRN
  Administered 2016-09-24: 60 mL via OPHTHALMIC

## 2016-09-24 SURGICAL SUPPLY — 26 items
CANNULA ANT/CHMB 27GA (MISCELLANEOUS) ×3 IMPLANT
CARTRIDGE ABBOTT (MISCELLANEOUS) IMPLANT
GLOVE SURG LX 7.5 STRW (GLOVE) ×2
GLOVE SURG LX STRL 7.5 STRW (GLOVE) ×1 IMPLANT
GLOVE SURG TRIUMPH 8.0 PF LTX (GLOVE) ×3 IMPLANT
GOWN STRL REUS W/ TWL LRG LVL3 (GOWN DISPOSABLE) ×2 IMPLANT
GOWN STRL REUS W/TWL LRG LVL3 (GOWN DISPOSABLE) ×4
LENS IOL ACRSF IQ ULTRA 24.5 (Intraocular Lens) ×1 IMPLANT
LENS IOL ACRYSOF IQ 24.5 (Intraocular Lens) ×3 IMPLANT
MARKER SKIN DUAL TIP RULER LAB (MISCELLANEOUS) ×3 IMPLANT
NDL RETROBULBAR .5 NSTRL (NEEDLE) IMPLANT
NEEDLE FILTER BLUNT 18X 1/2SAF (NEEDLE) ×2
NEEDLE FILTER BLUNT 18X1 1/2 (NEEDLE) ×1 IMPLANT
PACK CATARACT BRASINGTON (MISCELLANEOUS) ×3 IMPLANT
PACK EYE AFTER SURG (MISCELLANEOUS) ×3 IMPLANT
PACK OPTHALMIC (MISCELLANEOUS) ×3 IMPLANT
RING MALYGIN 7.0 (MISCELLANEOUS) IMPLANT
SUT ETHILON 10-0 CS-B-6CS-B-6 (SUTURE)
SUT VICRYL  9 0 (SUTURE)
SUT VICRYL 9 0 (SUTURE) IMPLANT
SUTURE EHLN 10-0 CS-B-6CS-B-6 (SUTURE) IMPLANT
SYR 3ML LL SCALE MARK (SYRINGE) ×3 IMPLANT
SYR 5ML LL (SYRINGE) ×3 IMPLANT
SYR TB 1ML LUER SLIP (SYRINGE) ×3 IMPLANT
WATER STERILE IRR 250ML POUR (IV SOLUTION) ×3 IMPLANT
WIPE NON LINTING 3.25X3.25 (MISCELLANEOUS) ×3 IMPLANT

## 2016-09-24 NOTE — Anesthesia Procedure Notes (Signed)
Procedure Name: MAC Date/Time: 09/24/2016 9:56 AM Performed by: Londell Moh Pre-anesthesia Checklist: Patient identified, Emergency Drugs available, Suction available, Timeout performed and Patient being monitored Patient Re-evaluated:Patient Re-evaluated prior to inductionOxygen Delivery Method: Nasal cannula Placement Confirmation: positive ETCO2

## 2016-09-24 NOTE — Anesthesia Postprocedure Evaluation (Signed)
Anesthesia Post Note  Patient: Marcus [name]  Procedure(s) Performed: Procedure(s) (LRB): CATARACT EXTRACTION PHACO AND INTRAOCULAR LENS PLACEMENT (IOC)  Left (Left)  Patient location during evaluation: PACU Anesthesia Type: MAC Level of consciousness: awake and alert and oriented Pain management: satisfactory to patient Vital Signs Assessment: post-procedure vital signs reviewed and stable Respiratory status: spontaneous breathing, nonlabored ventilation and respiratory function stable Cardiovascular status: blood pressure returned to baseline and stable Postop Assessment: Adequate PO intake and No signs of nausea or vomiting Anesthetic complications: no    Raliegh Ip

## 2016-09-24 NOTE — H&P (Signed)
The History and Physical notes are on paper, have been signed, and are to be scanned. The patient remains stable and unchanged from the H&P.   Previous H&P reviewed, patient examined, and there are no changes.  Lynnex Fulp 09/24/2016 9:24 AM

## 2016-09-24 NOTE — Op Note (Signed)
OPERATIVE NOTE  Marcus [name] [id number] 09/24/2016   PREOPERATIVE DIAGNOSIS:  Nuclear sclerotic cataract left eye. H25.12   POSTOPERATIVE DIAGNOSIS:    Nuclear sclerotic cataract left eye.     PROCEDURE:  Phacoemusification with posterior chamber intraocular lens placement of the left eye   LENS:   Implant Name Type Inv. Item Serial No. Manufacturer Lot No. LRB No. Used  LENS IOL ACRYSOF IQ 24.5 - [id number] Intraocular Lens LENS IOL ACRYSOF IQ 24.5 [id number] ALCON   Left 1        ULTRASOUND TIME: 14  % of 1 minutes 8 seconds, CDE 9.5  SURGEON:  Wyonia Hough, MD   ANESTHESIA:  Topical with tetracaine drops and 2% Xylocaine jelly, augmented with 1% preservative-free intracameral lidocaine.    COMPLICATIONS:  None.   DESCRIPTION OF PROCEDURE:  The patient was identified in the holding room and transported to the operating room and placed in the supine position under the operating microscope.  The left eye was identified as the operative eye and it was prepped and draped in the usual sterile ophthalmic fashion.   A 1 millimeter clear-corneal paracentesis was made at the 1:30 position.  0.5 ml of preservative-free 1% lidocaine was injected into the anterior chamber.  The anterior chamber was filled with Viscoat viscoelastic.  A 2.4 millimeter keratome was used to make a near-clear corneal incision at the 10:30 position.  .  A curvilinear capsulorrhexis was made with a cystotome and capsulorrhexis forceps.  Balanced salt solution was used to hydrodissect and hydrodelineate the nucleus.   Phacoemulsification was then used in stop and chop fashion to remove the lens nucleus and epinucleus.  The remaining cortex was then removed using the irrigation and aspiration handpiece. Provisc was then placed into the capsular bag to distend it for lens placement.  A lens was then injected into the capsular bag.  The remaining viscoelastic was aspirated.   Wounds were hydrated with  balanced salt solution.  The anterior chamber was inflated to a physiologic pressure with balanced salt solution.  No wound leaks were noted. Cefuroxime 0.1 ml of a 10mg /ml solution was injected into the anterior chamber for a dose of 1 mg of intracameral antibiotic at the completion of the case.   Timolol and Brimonidine drops were applied to the eye.  The patient was taken to the recovery room in stable condition without complications of anesthesia or surgery.  Marcus [name] 09/24/2016, 10:11 AM

## 2016-09-24 NOTE — Anesthesia Preprocedure Evaluation (Signed)
Anesthesia Evaluation  Patient identified by MRN, date of birth, ID band Patient awake    Reviewed: Allergy & Precautions, H&P , NPO status , Patient's Chart, lab work & pertinent test results  Airway Mallampati: II  TM Distance: >3 FB Neck ROM: full    Dental no notable dental hx.    Pulmonary former smoker,    Pulmonary exam normal        Cardiovascular hypertension, + CAD, + Peripheral Vascular Disease and +CHF  Normal cardiovascular exam+ pacemaker      Neuro/Psych    GI/Hepatic   Endo/Other    Renal/GU Renal disease     Musculoskeletal   Abdominal   Peds  Hematology   Anesthesia Other Findings   Reproductive/Obstetrics                             Anesthesia Physical Anesthesia Plan  ASA: III  Anesthesia Plan: MAC   Post-op Pain Management:    Induction:   Airway Management Planned:   Additional Equipment:   Intra-op Plan:   Post-operative Plan:   Informed Consent: I have reviewed the patients History and Physical, chart, labs and discussed the procedure including the risks, benefits and alternatives for the proposed anesthesia with the patient or authorized representative who has indicated his/her understanding and acceptance.     Plan Discussed with:   Anesthesia Plan Comments:         Anesthesia Quick Evaluation

## 2016-09-24 NOTE — Transfer of Care (Signed)
Immediate Anesthesia Transfer of Care Note  Patient: Marcus [name]  Procedure(s) Performed: Procedure(s): CATARACT EXTRACTION PHACO AND INTRAOCULAR LENS PLACEMENT (IOC)  Left (Left)  Patient Location: PACU  Anesthesia Type: MAC  Level of Consciousness: awake, alert  and patient cooperative  Airway and Oxygen Therapy: Patient Spontanous Breathing and Patient connected to supplemental oxygen  Post-op Assessment: Post-op Vital signs reviewed, Patient's Cardiovascular Status Stable, Respiratory Function Stable, Patent Airway and No signs of Nausea or vomiting  Post-op Vital Signs: Reviewed and stable  Complications: No apparent anesthesia complications

## 2016-09-25 ENCOUNTER — Encounter: Payer: Self-pay | Admitting: Ophthalmology

## 2016-09-28 ENCOUNTER — Other Ambulatory Visit (HOSPITAL_COMMUNITY): Payer: Self-pay | Admitting: Cardiology

## 2016-09-28 DIAGNOSIS — I5023 Acute on chronic systolic (congestive) heart failure: Secondary | ICD-10-CM

## 2016-09-28 DIAGNOSIS — I480 Paroxysmal atrial fibrillation: Secondary | ICD-10-CM

## 2016-09-28 MED ORDER — AMIODARONE HCL 200 MG PO TABS: 100.0000 mg | ORAL_TABLET | Freq: Every day | ORAL | 3 refills | Status: DC

## 2016-10-04 ENCOUNTER — Telehealth: Payer: Self-pay

## 2016-10-04 ENCOUNTER — Ambulatory Visit (INDEPENDENT_AMBULATORY_CARE_PROVIDER_SITE_OTHER): Payer: Medicare Other

## 2016-10-04 DIAGNOSIS — I5022 Chronic systolic (congestive) heart failure: Secondary | ICD-10-CM

## 2016-10-04 DIAGNOSIS — Z950 Presence of cardiac pacemaker: Secondary | ICD-10-CM | POA: Diagnosis not present

## 2016-10-04 NOTE — Telephone Encounter (Signed)
Remote ICM transmission received.  Attempted patient call and left detailed message regarding transmission and next ICM scheduled for 11/06/2016.  Advised to return call for any fluid symptoms or questions.

## 2016-10-04 NOTE — Progress Notes (Signed)
EPIC Encounter for ICM Monitoring  Patient Name: Marcus [name] is a 83 y.o. male Date: 10/04/2016 Primary Care Physican: Patient, No Pcp Per Primary Cardiologist:[facility] Electrophysiologist: Lovena Le Dry Weight: unknown Bi-V Pacing: 96%         Attempted call to patient and unable to reach.  Left detailed message regarding transmission.  Transmission reviewed.    Thoracic impedance normal but was abnormal 09/16/2016 to 09/24/2016 suggesting fluid accumulation.  Prescribed dosage: Torsemide 20 mg 1 tablet every other day.  Labs:  06/14/2016 Creatinine 2.54. BUN 59, Potassium 4.9, Sodium 137, EGFR 22-25 06/01/2016 Creatinine 2.78, BUN 78, Potassium 4.5, Sodium 136, EGFR 19-22 Torsemide held x 4 days then decrease to 20 mg every other day per lab note due to increase Creatinine. 05/22/2016 Creatinine 2.38, BUN 60, Potassium 4.7, Sodium 136, EGFR 23-27 05/08/2016 Creatinine 2.16, BUN 40, Potassium 4.5, Sodium 143, EGFR 26-30 03/26/2016 Creatinine 1.77, BUN 26, Potassium 4.3, Sodium 140, EGFR 33-39 02/21/2016 Creatinine 2.45, BUN 75, Potassium 5.0, Sodium 138, EGFR 23-26  10/05/2016Creatinine 2.29, BUN 49, Potassium 4.8, Sodium 137, EGFR 25-29   Recommendations: Left voice mail with ICM number and encouraged to call for fluid symptoms.  Follow-up plan: ICM clinic phone appointment on 11/06/2016.  Office appointment scheduled on 11/27/2016 with Dr Lovena Le.  Copy of ICM check sent to device physician.   3 month ICM trend: 10/04/2016   1 Year ICM trend:      Rosalene Billings, RN 10/04/2016 8:21 AM

## 2016-10-05 IMAGING — DX DG CHEST 2V
2 series · 2 of 2 positions shown · non-contrast
Comparison: Prior radiograph from 07/23/2014

CLINICAL DATA: Initial evaluation for acute chest pain at surgical
site status post biventricular cardiac pacemaker insertion.

EXAM:
CHEST  2 VIEW

[chest pa]
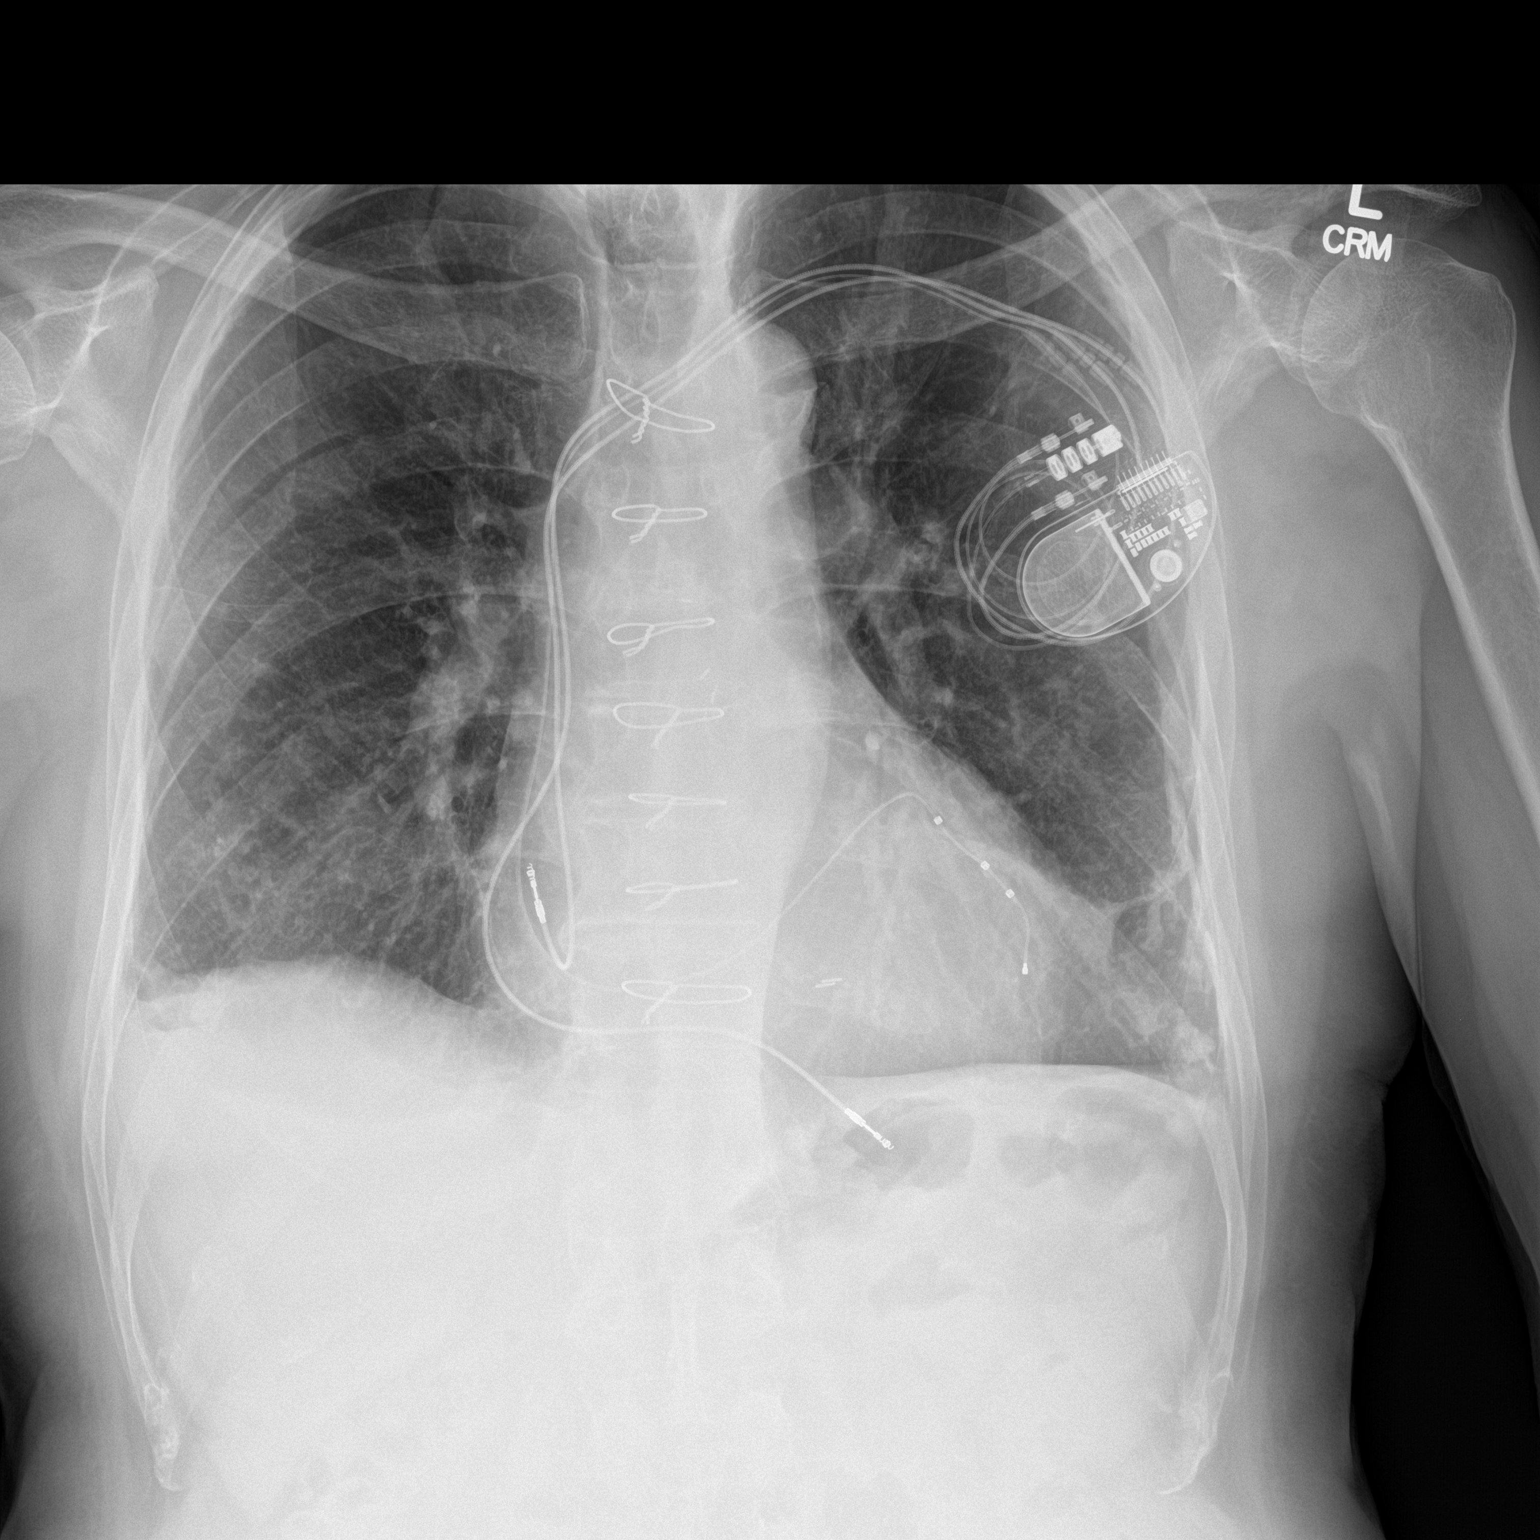

[chest lat]
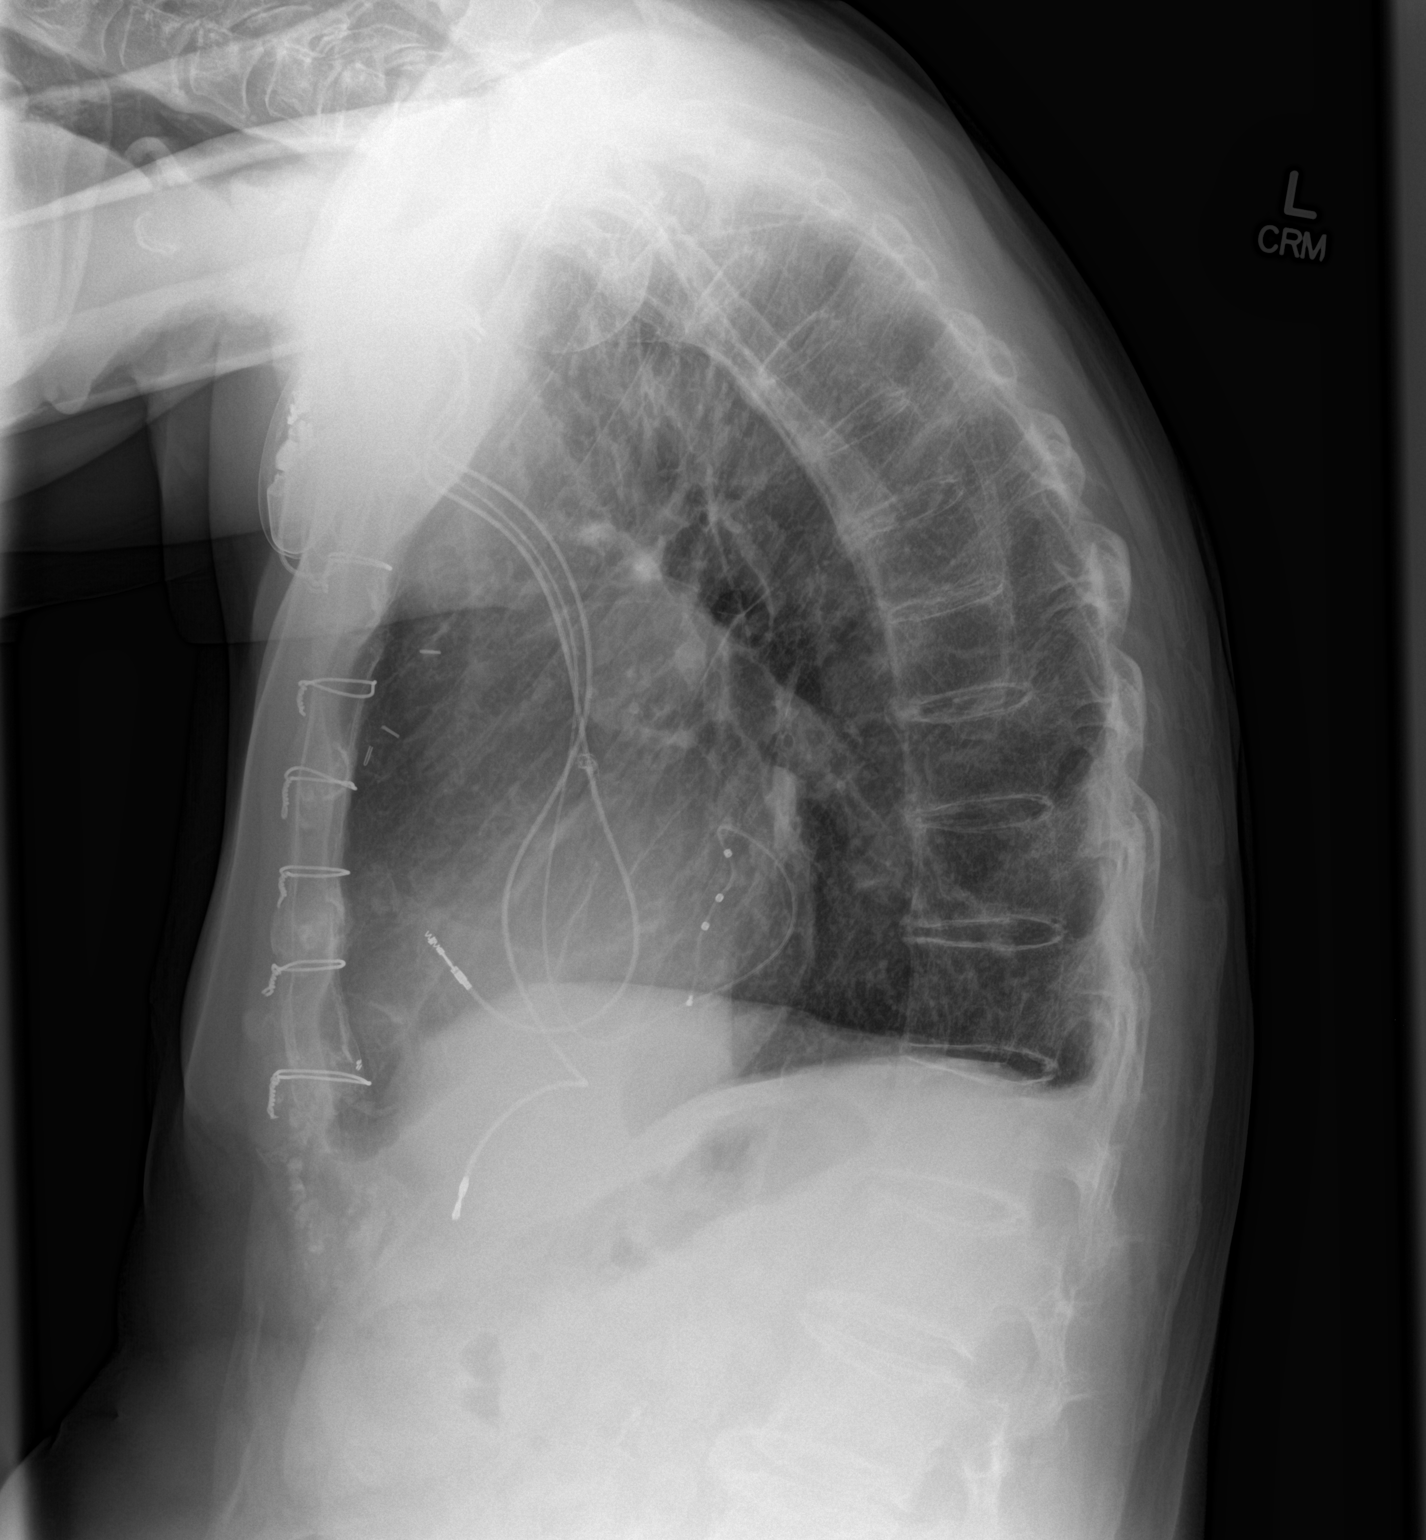

[2 of 2 positions shown; findings below may reference images not displayed]

FINDINGS: Triple lead left-sided transvenous pacemaker/ AICD is in place.
Position is stable and leads are intact. No abnormality seen about
the generator itself.

Cardiomegaly stable. Median sternotomy wires underlying surgical
clips noted.

Lungs are normally inflated. Parenchymal scarring at the left lung
base is stable. No focal infiltrate or pulmonary edema. No
pneumothorax. There is blunting of the right costophrenic angle,
likely a small right pleural effusion.

No acute osseus abnormality.  Diffuse osteopenia noted.
IMPRESSION: 1. Stable appearance of left-sided transvenous pacemaker/AICD
without complication.
2. Small right pleural effusion.

## 2016-10-08 DIAGNOSIS — H2511 Age-related nuclear cataract, right eye: Secondary | ICD-10-CM | POA: Diagnosis not present

## 2016-10-16 MED ORDER — ACETAMINOPHEN 325 MG PO TABS: 325.0000 mg | ORAL_TABLET | ORAL | Status: DC | PRN

## 2016-10-16 MED ORDER — ACETAMINOPHEN 160 MG/5ML PO SOLN: 325.0000 mg | ORAL | Status: DC | PRN

## 2016-10-16 NOTE — Discharge Instructions (Signed)
Cataract Surgery, Care After °Refer to this sheet in the next few weeks. These instructions provide you with information about caring for yourself after your procedure. Your health care provider may also give you more specific instructions. Your treatment has been planned according to current medical practices, but problems sometimes occur. Call your health care provider if you have any problems or questions after your procedure. °What can I expect after the procedure? °After the procedure, it is common to have: °· Itching. °· Discomfort. °· Fluid discharge. °· Sensitivity to light and to touch. °· Bruising. °Follow these instructions at home: °Eye Care  °· Check your eye every day for signs of infection. Watch for: °¨ Redness, swelling, or pain. °¨ Fluid, blood, or pus. °¨ Warmth. °¨ Bad smell. °Activity  °· Avoid strenuous activities, such as playing contact sports, for as long as told by your health care provider. °· Do not drive or operate heavy machinery until your health care provider approves. °· Do not bend or lift heavy objects . Bending increases pressure in the eye. You can walk, climb stairs, and do light household chores. °· Ask your health care provider when you can return to work. If you work in a dusty environment, you may be advised to wear protective eyewear for a period of time. °General instructions  °· Take or apply over-the-counter and prescription medicines only as told by your health care provider. This includes eye drops. °· Do not touch or rub your eyes. °· If you were given a protective shield, wear it as told by your health care provider. If you were not given a protective shield, wear sunglasses as told by your health care provider to protect your eyes. °· Keep the area around your eye clean and dry. Avoid swimming or allowing water to hit you directly in the face while showering until told by your health care provider. Keep soap and shampoo out of your eyes. °· Do not put a contact lens  into the affected eye or eyes until your health care provider approves. °· Keep all follow-up visits as told by your health care provider. This is important. °Contact a health care provider if: ° °· You have increased bruising around your eye. °· You have pain that is not helped with medicine. °· You have a fever. °· You have redness, swelling, or pain in your eye. °· You have fluid, blood, or pus coming from your incision. °· Your vision gets worse. °Get help right away if: °· You have sudden vision loss. °This information is not intended to replace advice given to you by your health care provider. Make sure you discuss any questions you have with your health care provider. °Document Released: 11/24/2004 Document Revised: 09/15/2015 Document Reviewed: 03/17/2015 °Elsevier Interactive Patient Education © 2017 [employer] ° ° ° ° °General Anesthesia, Adult, Care After °These instructions provide you with information about caring for yourself after your procedure. Your health care provider may also give you more specific instructions. Your treatment has been planned according to current medical practices, but problems sometimes occur. Call your health care provider if you have any problems or questions after your procedure. °What can I expect after the procedure? °After the procedure, it is common to have: °· Vomiting. °· A sore throat. °· Mental slowness. °It is common to feel: °· Nauseous. °· Cold or shivery. °· Sleepy. °· Tired. °· Sore or achy, even in parts of your body where you did not have surgery. °Follow these instructions at   home: °For at least 24 hours after the procedure:  °· Do not: °¨ Participate in activities where you could fall or become injured. °¨ Drive. °¨ Use heavy machinery. °¨ Drink alcohol. °¨ Take sleeping pills or medicines that cause drowsiness. °¨ Make important decisions or sign legal documents. °¨ Take care of children on your own. °· Rest. °Eating and drinking  °· If you vomit, drink  water, juice, or soup when you can drink without vomiting. °· Drink enough fluid to keep your urine clear or pale yellow. °· Make sure you have little or no nausea before eating solid foods. °· Follow the diet recommended by your health care provider. °General instructions  °· Have a responsible adult stay with you until you are awake and alert. °· Return to your normal activities as told by your health care provider. Ask your health care provider what activities are safe for you. °· Take over-the-counter and prescription medicines only as told by your health care provider. °· If you smoke, do not smoke without supervision. °· Keep all follow-up visits as told by your health care provider. This is important. °Contact a health care provider if: °· You continue to have nausea or vomiting at home, and medicines are not helpful. °· You cannot drink fluids or start eating again. °· You cannot urinate after 8-12 hours. °· You develop a skin rash. °· You have fever. °· You have increasing redness at the site of your procedure. °Get help right away if: °· You have difficulty breathing. °· You have chest pain. °· You have unexpected bleeding. °· You feel that you are having a life-threatening or urgent problem. °This information is not intended to replace advice given to you by your health care provider. Make sure you discuss any questions you have with your health care provider. °Document Released: 08/13/2000 Document Revised: 10/10/2015 Document Reviewed: 04/21/2015 °Elsevier Interactive Patient Education © 2017 [employer] ° °

## 2016-10-17 ENCOUNTER — Ambulatory Visit: Payer: Medicare Other | Admitting: Anesthesiology

## 2016-10-17 ENCOUNTER — Ambulatory Visit
Admission: RE | Admit: 2016-10-17 | Discharge: 2016-10-17 | Disposition: A | Discharge status: Home / Self Care | Payer: Medicare Other | Source: Ambulatory Visit | Attending: Ophthalmology | Admitting: Ophthalmology

## 2016-10-17 ENCOUNTER — Encounter
Admission: RE | Disposition: A | Discharge status: Home / Self Care | Payer: Self-pay | Source: Ambulatory Visit | Attending: Ophthalmology

## 2016-10-17 DIAGNOSIS — Z87891 Personal history of nicotine dependence: Secondary | ICD-10-CM | POA: Diagnosis not present

## 2016-10-17 DIAGNOSIS — I2510 Atherosclerotic heart disease of native coronary artery without angina pectoris: Secondary | ICD-10-CM | POA: Insufficient documentation

## 2016-10-17 DIAGNOSIS — Z79899 Other long term (current) drug therapy: Secondary | ICD-10-CM | POA: Diagnosis not present

## 2016-10-17 DIAGNOSIS — I110 Hypertensive heart disease with heart failure: Secondary | ICD-10-CM | POA: Insufficient documentation

## 2016-10-17 DIAGNOSIS — Z7901 Long term (current) use of anticoagulants: Secondary | ICD-10-CM | POA: Diagnosis not present

## 2016-10-17 DIAGNOSIS — I509 Heart failure, unspecified: Secondary | ICD-10-CM | POA: Diagnosis not present

## 2016-10-17 DIAGNOSIS — H2511 Age-related nuclear cataract, right eye: Secondary | ICD-10-CM | POA: Insufficient documentation

## 2016-10-17 HISTORY — PX: CATARACT EXTRACTION W/PHACO: SHX586

## 2016-10-17 SURGERY — PHACOEMULSIFICATION, CATARACT, WITH IOL INSERTION
Anesthesia: Monitor Anesthesia Care | Site: Eye | Laterality: Right

## 2016-10-17 MED ORDER — ARMC OPHTHALMIC DILATING DROPS
1.0000 | OPHTHALMIC | Status: DC | PRN
Start: 2016-10-17 — End: 2016-10-17
  Administered 2016-10-17 (×3): 1 via OPHTHALMIC

## 2016-10-17 MED ORDER — MOXIFLOXACIN HCL 0.5 % OP SOLN
1.0000 [drp] | OPHTHALMIC | Status: DC | PRN
  Administered 2016-10-17 (×3): 1 [drp] via OPHTHALMIC

## 2016-10-17 MED ORDER — BRIMONIDINE TARTRATE-TIMOLOL 0.2-0.5 % OP SOLN
OPHTHALMIC | Status: DC | PRN
  Administered 2016-10-17: 1 [drp] via OPHTHALMIC

## 2016-10-17 MED ORDER — NA HYALUR & NA CHOND-NA HYALUR 0.4-0.35 ML IO KIT
PACK | INTRAOCULAR | Status: DC | PRN
  Administered 2016-10-17: 1 mL via INTRAOCULAR

## 2016-10-17 MED ORDER — MIDAZOLAM HCL 2 MG/2ML IJ SOLN
INTRAMUSCULAR | Status: DC | PRN
Start: 2016-10-17 — End: 2016-10-17
  Administered 2016-10-17: 1 mg via INTRAVENOUS

## 2016-10-17 MED ORDER — LIDOCAINE HCL (PF) 2 % IJ SOLN
INTRAMUSCULAR | Status: DC | PRN
  Administered 2016-10-17: 1 mL via INTRAOCULAR

## 2016-10-17 MED ORDER — LACTATED RINGERS IV SOLN: INTRAVENOUS | Status: DC

## 2016-10-17 MED ORDER — EPINEPHRINE PF 1 MG/ML IJ SOLN
INTRAOCULAR | Status: DC | PRN
  Administered 2016-10-17: 52 mL via OPHTHALMIC

## 2016-10-17 MED ORDER — ACETAMINOPHEN 325 MG PO TABS
325.0000 mg | ORAL_TABLET | ORAL | Status: DC | PRN
Start: 2016-10-17 — End: 2016-10-17

## 2016-10-17 MED ORDER — ACETAMINOPHEN 160 MG/5ML PO SOLN: 325.0000 mg | ORAL | Status: DC | PRN

## 2016-10-17 MED ORDER — FENTANYL CITRATE (PF) 100 MCG/2ML IJ SOLN
INTRAMUSCULAR | Status: DC | PRN
  Administered 2016-10-17: 50 ug via INTRAVENOUS

## 2016-10-17 MED ORDER — CEFUROXIME OPHTHALMIC INJECTION 1 MG/0.1 ML
INJECTION | OPHTHALMIC | Status: DC | PRN
  Administered 2016-10-17: .3 mL via OPHTHALMIC

## 2016-10-17 SURGICAL SUPPLY — 26 items
CANNULA ANT/CHMB 27GA (MISCELLANEOUS) ×3 IMPLANT
CARTRIDGE ABBOTT (MISCELLANEOUS) IMPLANT
GLOVE SURG LX 7.5 STRW (GLOVE) ×2
GLOVE SURG LX STRL 7.5 STRW (GLOVE) ×1 IMPLANT
GLOVE SURG TRIUMPH 8.0 PF LTX (GLOVE) ×3 IMPLANT
GOWN STRL REUS W/ TWL LRG LVL3 (GOWN DISPOSABLE) ×2 IMPLANT
GOWN STRL REUS W/TWL LRG LVL3 (GOWN DISPOSABLE) ×4
LENS IOL ACRSF IQ ULTRA 25.5 (Intraocular Lens) ×1 IMPLANT
LENS IOL ACRYSOF IQ 25.5 (Intraocular Lens) ×3 IMPLANT
MARKER SKIN DUAL TIP RULER LAB (MISCELLANEOUS) ×3 IMPLANT
NDL RETROBULBAR .5 NSTRL (NEEDLE) IMPLANT
NEEDLE FILTER BLUNT 18X 1/2SAF (NEEDLE) ×2
NEEDLE FILTER BLUNT 18X1 1/2 (NEEDLE) ×1 IMPLANT
PACK CATARACT BRASINGTON (MISCELLANEOUS) ×3 IMPLANT
PACK EYE AFTER SURG (MISCELLANEOUS) ×3 IMPLANT
PACK OPTHALMIC (MISCELLANEOUS) ×3 IMPLANT
RING MALYGIN 7.0 (MISCELLANEOUS) IMPLANT
SUT ETHILON 10-0 CS-B-6CS-B-6 (SUTURE)
SUT VICRYL  9 0 (SUTURE)
SUT VICRYL 9 0 (SUTURE) IMPLANT
SUTURE EHLN 10-0 CS-B-6CS-B-6 (SUTURE) IMPLANT
SYR 3ML LL SCALE MARK (SYRINGE) ×3 IMPLANT
SYR 5ML LL (SYRINGE) ×3 IMPLANT
SYR TB 1ML LUER SLIP (SYRINGE) ×3 IMPLANT
WATER STERILE IRR 250ML POUR (IV SOLUTION) ×3 IMPLANT
WIPE NON LINTING 3.25X3.25 (MISCELLANEOUS) ×3 IMPLANT

## 2016-10-17 NOTE — H&P (Signed)
The History and Physical notes are on paper, have been signed, and are to be scanned. The patient remains stable and unchanged from the H&P.   Previous H&P reviewed, patient examined, and there are no changes.  Marcus [name] 10/17/2016 12:17 PM

## 2016-10-17 NOTE — Anesthesia Preprocedure Evaluation (Signed)
Anesthesia Evaluation  Patient identified by MRN, date of birth, ID band Patient awake    Reviewed: Allergy & Precautions, H&P , NPO status , Patient's Chart, lab work & pertinent test results  Airway Mallampati: II  TM Distance: >3 FB Neck ROM: full    Dental no notable dental hx.    Pulmonary former smoker,    Pulmonary exam normal        Cardiovascular hypertension, + CAD, + Peripheral Vascular Disease and +CHF  Normal cardiovascular exam+ pacemaker      Neuro/Psych    GI/Hepatic   Endo/Other    Renal/GU Renal disease     Musculoskeletal   Abdominal   Peds  Hematology   Anesthesia Other Findings   Reproductive/Obstetrics                             Anesthesia Physical  Anesthesia Plan  ASA: III  Anesthesia Plan: MAC   Post-op Pain Management:    Induction:   Airway Management Planned:   Additional Equipment:   Intra-op Plan:   Post-operative Plan:   Informed Consent: I have reviewed the patients History and Physical, chart, labs and discussed the procedure including the risks, benefits and alternatives for the proposed anesthesia with the patient or authorized representative who has indicated his/her understanding and acceptance.     Plan Discussed with:   Anesthesia Plan Comments:         Anesthesia Quick Evaluation

## 2016-10-17 NOTE — Anesthesia Procedure Notes (Signed)
Procedure Name: MAC Date/Time: 10/17/2016 12:43 PM Performed by: Janna Arch Pre-anesthesia Checklist: Patient identified, Emergency Drugs available, Suction available and Patient being monitored Patient Re-evaluated:Patient Re-evaluated prior to inductionOxygen Delivery Method: Nasal cannula

## 2016-10-17 NOTE — Anesthesia Postprocedure Evaluation (Signed)
Anesthesia Post Note  Patient: [name]  Procedure(s) Performed: Procedure(s) (LRB): CATARACT EXTRACTION PHACO AND INTRAOCULAR LENS PLACEMENT (IOC)  Right (Right)  Patient location during evaluation: PACU Anesthesia Type: MAC Level of consciousness: awake and alert and oriented Pain management: satisfactory to patient Vital Signs Assessment: post-procedure vital signs reviewed and stable Respiratory status: spontaneous breathing, nonlabored ventilation and respiratory function stable Cardiovascular status: blood pressure returned to baseline and stable Postop Assessment: Adequate PO intake and No signs of nausea or vomiting Anesthetic complications: no    Raliegh Ip

## 2016-10-17 NOTE — Transfer of Care (Signed)
Immediate Anesthesia Transfer of Care Note  Patient: Marcus [name]  Procedure(s) Performed: Procedure(s) with comments: CATARACT EXTRACTION PHACO AND INTRAOCULAR LENS PLACEMENT (IOC)  Right (Right) - prefers mid to late morning arrival  Patient Location: PACU  Anesthesia Type: MAC  Level of Consciousness: awake, alert  and patient cooperative  Airway and Oxygen Therapy: Patient Spontanous Breathing and Patient connected to supplemental oxygen  Post-op Assessment: Post-op Vital signs reviewed, Patient's Cardiovascular Status Stable, Respiratory Function Stable, Patent Airway and No signs of Nausea or vomiting  Post-op Vital Signs: Reviewed and stable  Complications: No apparent anesthesia complications

## 2016-10-17 NOTE — Op Note (Signed)
LOCATION:  [place]   PREOPERATIVE DIAGNOSIS:    Nuclear sclerotic cataract right eye. H25.11   POSTOPERATIVE DIAGNOSIS:  Nuclear sclerotic cataract right eye.     PROCEDURE:  Phacoemusification with posterior chamber intraocular lens placement of the right eye   LENS:   Implant Name Type Inv. Item Serial No. Manufacturer Lot No. LRB No. Used  LENS IOL ACRYSOF IQ 25.5 - [id number] Intraocular Lens LENS IOL ACRYSOF IQ 25.5 [id number] [employer]   Right 1        ULTRASOUND TIME: 17 % of 0 minutes, 51 seconds.  CDE 8.5   SURGEON:  Wyonia Hough, MD   ANESTHESIA:  Topical with tetracaine drops and 2% Xylocaine jelly, augmented with 1% preservative-free intracameral lidocaine.    COMPLICATIONS:  None.   DESCRIPTION OF PROCEDURE:  The patient was identified in the holding room and transported to the operating room and placed in the supine position under the operating microscope.  The right eye was identified as the operative eye and it was prepped and draped in the usual sterile ophthalmic fashion.   A 1 millimeter clear-corneal paracentesis was made at the 12:00 position.  0.5 ml of preservative-free 1% lidocaine was injected into the anterior chamber. The anterior chamber was filled with Viscoat viscoelastic.  A 2.4 millimeter keratome was used to make a near-clear corneal incision at the 9:00 position.  A curvilinear capsulorrhexis was made with a cystotome and capsulorrhexis forceps.  Balanced salt solution was used to hydrodissect and hydrodelineate the nucleus.   Phacoemulsification was then used in stop and chop fashion to remove the lens nucleus and epinucleus.  The remaining cortex was then removed using the irrigation and aspiration handpiece. Provisc was then placed into the capsular bag to distend it for lens placement.  A lens was then injected into the capsular bag.  The remaining viscoelastic was aspirated.   Wounds were hydrated with balanced salt solution.   The anterior chamber was inflated to a physiologic pressure with balanced salt solution.  No wound leaks were noted. Cefuroxime 0.1 ml of a 10mg /ml solution was injected into the anterior chamber for a dose of 1 mg of intracameral antibiotic at the completion of the case.   Timolol and Brimonidine drops were applied to the eye.  The patient was taken to the recovery room in stable condition without complications of anesthesia or surgery.   Lessie Funderburke 10/17/2016, 1:02 PM

## 2016-10-18 ENCOUNTER — Encounter: Payer: Self-pay | Admitting: Ophthalmology

## 2016-11-06 ENCOUNTER — Ambulatory Visit (INDEPENDENT_AMBULATORY_CARE_PROVIDER_SITE_OTHER): Payer: Medicare Other

## 2016-11-06 DIAGNOSIS — I5022 Chronic systolic (congestive) heart failure: Secondary | ICD-10-CM | POA: Diagnosis not present

## 2016-11-06 DIAGNOSIS — Z950 Presence of cardiac pacemaker: Secondary | ICD-10-CM

## 2016-11-06 NOTE — Progress Notes (Signed)
EPIC Encounter for ICM Monitoring  Patient Name: Marcus [name] is a 83 y.o. male Date: 11/06/2016 Primary Care Physican: Patient, No Pcp Per Primary Cardiologist:[facility] Electrophysiologist: Lovena Le Dry Weight: unknown Bi-V Pacing: 96%      Spoke with wife. Heart Failure questions reviewed, pt is having a lot of fatigue and tiredness.  She is unsure how much salt is in the diet but will recheck food labels to try and make decrease salt if needed.    Thoracic impedance abnormal suggesting fluid accumulation since 10/22/2016.  Impedance has been decreased 43 days since 08/19/2016.  Prescribed dosage: Torsemide 20 mg 1 tablet every other day.  Labs:  06/14/2016 Creatinine 2.54. BUN 59, Potassium 4.9, Sodium 137, EGFR 22-25 06/01/2016 Creatinine 2.78, BUN 78, Potassium 4.5, Sodium 136, EGFR 19-22 Torsemide held x 4 days then decrease to 20 mg every other day per lab note due to increase Creatinine. 05/22/2016 Creatinine 2.38, BUN 60, Potassium 4.7, Sodium 136, EGFR 23-27 05/08/2016 Creatinine 2.16, BUN 40, Potassium 4.5, Sodium 143, EGFR 26-30 03/26/2016 Creatinine 1.77, BUN 26, Potassium 4.3, Sodium 140, EGFR 33-39 02/21/2016 Creatinine 2.45, BUN 75, Potassium 5.0, Sodium 138, EGFR 23-26  10/05/2016Creatinine 2.29, BUN 49, Potassium 4.8, Sodium 137, EGFR 25-29   Recommendations: Copy of ICM check sent to Dr Aundra Dubin and Dr Lovena Le for review and if any recommendations will call back.     Follow-up plan: ICM clinic phone appointment on 11/15/2016 to recheck fluid levels.  Office appointment scheduled on 11/27/2016 with Dr. Lovena Le.  Advised to schedule HF appointment since last visit was 05/2016.     3 month ICM trend: 11/06/2016   1 Year ICM trend:      Rosalene Billings, RN 11/06/2016 8:22 AM

## 2016-11-06 NOTE — Progress Notes (Signed)
Take torsemide 40 mg one day, then increase torsemide to 20 mg po daily.  Needs followup in office.

## 2016-11-08 ENCOUNTER — Encounter: Payer: Self-pay | Admitting: Internal Medicine

## 2016-11-08 MED ORDER — TORSEMIDE 20 MG PO TABS: 20.0000 mg | ORAL_TABLET | Freq: Every day | ORAL | 3 refills | Status: DC

## 2016-11-08 NOTE — Progress Notes (Signed)
Call to wife and advised Dr Aundra Dubin ordered Torsemide 40 mg one day, then increase torsemide to 20 mg po daily. Advised HF clinic will contact her to make a follow up appointment.   She said she thinks patient has enough Torsemide on hand for now.  Advised will send script for new order of Torsemide 20 mg daily and to call pharmacy when she needs it filled.  [facility] is pharmacy of choice.  She verbalized understanding of orders.

## 2016-11-15 ENCOUNTER — Ambulatory Visit (INDEPENDENT_AMBULATORY_CARE_PROVIDER_SITE_OTHER): Payer: Medicare Other

## 2016-11-15 DIAGNOSIS — I5022 Chronic systolic (congestive) heart failure: Secondary | ICD-10-CM

## 2016-11-15 DIAGNOSIS — Z950 Presence of cardiac pacemaker: Secondary | ICD-10-CM

## 2016-11-15 NOTE — Progress Notes (Signed)
EPIC Encounter for ICM Monitoring  Patient Name: Marcus [name] is a 83 y.o. male Date: 11/15/2016 Primary Care Physican: Patient, No Pcp Per Primary Cardiologist:[place] Electrophysiologist: Lovena Le Dry Weight: unknown Bi-V Pacing: 96%      Spoke with wife. Heart Failure questions reviewed, pt asymptomatic.  Denied any dizziness or lightheadedness.    Thoracic impedance above baseline suggesting dryness since 6/21.  Prescribed dosage: Torsemide 20 mg daily (increased from every other day on 11/06/2016).  Confirmed patient is taking Torsemide 20 mg daily as instructed.   Labs:  06/14/2016 Creatinine 2.54. BUN 59, Potassium 4.9, Sodium 137, EGFR 22-25 06/01/2016 Creatinine 2.78, BUN 78, Potassium 4.5, Sodium 136, EGFR 19-22  05/22/2016 Creatinine 2.38, BUN 60, Potassium 4.7, Sodium 136, EGFR 23-27 05/08/2016 Creatinine 2.16, BUN 40, Potassium 4.5, Sodium 143, EGFR 26-30 03/26/2016 Creatinine 1.77, BUN 26, Potassium 4.3, Sodium 140, EGFR 33-39 02/21/2016 Creatinine 2.45, BUN 75, Potassium 5.0, Sodium 138, EGFR 23-26  10/05/2016Creatinine 2.29, BUN 49, Potassium 4.8, Sodium 137, EGFR 25-29   Recommendations: No changes.  Advised to have patient drink enough fluids and to limit at 2 liters/day.  Encouraged to call for fluid symptoms.  Follow-up plan: ICM clinic phone appointment on 12/10/2016.  Office appointment scheduled on 11/22/2016 with HF clinic and 11/27/2016 with Dr. Lovena Le.  Copy of ICM check sent to Dr Aundra Dubin and Dr Lovena Le for review.   3 month ICM trend: 11/15/2016   1 Year ICM trend:      Rosalene Billings, RN 11/15/2016 9:45 AM

## 2016-11-22 ENCOUNTER — Ambulatory Visit (HOSPITAL_COMMUNITY)
Admission: RE | Admit: 2016-11-22 | Discharge: 2016-11-22 | Disposition: A | Discharge status: Home / Self Care | Payer: Medicare Other | Source: Ambulatory Visit | Attending: Internal Medicine | Admitting: Internal Medicine

## 2016-11-22 ENCOUNTER — Encounter: Payer: Self-pay | Admitting: Cardiology

## 2016-11-22 VITALS — BP 92/58 | HR 108 | Wt 167.8 lb

## 2016-11-22 DIAGNOSIS — Z951 Presence of aortocoronary bypass graft: Secondary | ICD-10-CM | POA: Insufficient documentation

## 2016-11-22 DIAGNOSIS — E785 Hyperlipidemia, unspecified: Secondary | ICD-10-CM | POA: Insufficient documentation

## 2016-11-22 DIAGNOSIS — Z8546 Personal history of malignant neoplasm of prostate: Secondary | ICD-10-CM | POA: Diagnosis not present

## 2016-11-22 DIAGNOSIS — I495 Sick sinus syndrome: Secondary | ICD-10-CM | POA: Insufficient documentation

## 2016-11-22 DIAGNOSIS — Z7901 Long term (current) use of anticoagulants: Secondary | ICD-10-CM | POA: Diagnosis not present

## 2016-11-22 DIAGNOSIS — Z8249 Family history of ischemic heart disease and other diseases of the circulatory system: Secondary | ICD-10-CM | POA: Insufficient documentation

## 2016-11-22 DIAGNOSIS — N183 Chronic kidney disease, stage 3 unspecified: Secondary | ICD-10-CM

## 2016-11-22 DIAGNOSIS — I4892 Unspecified atrial flutter: Secondary | ICD-10-CM | POA: Diagnosis not present

## 2016-11-22 DIAGNOSIS — I130 Hypertensive heart and chronic kidney disease with heart failure and stage 1 through stage 4 chronic kidney disease, or unspecified chronic kidney disease: Secondary | ICD-10-CM | POA: Diagnosis not present

## 2016-11-22 DIAGNOSIS — I5022 Chronic systolic (congestive) heart failure: Secondary | ICD-10-CM | POA: Diagnosis not present

## 2016-11-22 DIAGNOSIS — I255 Ischemic cardiomyopathy: Secondary | ICD-10-CM | POA: Diagnosis not present

## 2016-11-22 DIAGNOSIS — I480 Paroxysmal atrial fibrillation: Secondary | ICD-10-CM

## 2016-11-22 DIAGNOSIS — I2510 Atherosclerotic heart disease of native coronary artery without angina pectoris: Secondary | ICD-10-CM | POA: Insufficient documentation

## 2016-11-22 DIAGNOSIS — Z950 Presence of cardiac pacemaker: Secondary | ICD-10-CM | POA: Diagnosis not present

## 2016-11-22 LAB — BASIC METABOLIC PANEL
Anion gap: 13 (ref 5–15)
BUN: 61 mg/dL — AB (ref 6–20)
CALCIUM: 9.7 mg/dL (ref 8.9–10.3)
CO2: 22 mmol/L (ref 22–32)
Chloride: 104 mmol/L (ref 101–111)
Creatinine, Ser: 2.57 mg/dL — ABNORMAL HIGH (ref 0.61–1.24)
GFR calc Af Amer: 25 mL/min — ABNORMAL LOW (ref >60)
GFR, EST NON AFRICAN AMERICAN: 21 mL/min — AB (ref >60)
GLUCOSE: 83 mg/dL (ref 65–99)
Potassium: 4.3 mmol/L (ref 3.5–5.1)
Sodium: 139 mmol/L (ref 135–145)

## 2016-11-22 LAB — CBC
HCT: 39.3 % (ref 39.0–52.0)
Hemoglobin: 12.5 g/dL — ABNORMAL LOW (ref 13.0–17.0)
MCH: 31.6 pg (ref 26.0–34.0)
MCHC: 31.8 g/dL (ref 30.0–36.0)
MCV: 99.5 fL (ref 78.0–100.0)
PLATELETS: 118 10*3/uL — AB (ref 150–400)
RBC: 3.95 MIL/uL — ABNORMAL LOW (ref 4.22–5.81)
RDW: 14.3 % (ref 11.5–15.5)
WBC: 3.7 10*3/uL — ABNORMAL LOW (ref 4.0–10.5)

## 2016-11-22 MED ORDER — TORSEMIDE 20 MG PO TABS: ORAL_TABLET | ORAL | 3 refills | Status: DC

## 2016-11-22 NOTE — Patient Instructions (Signed)
INCREASE Torsemide to 20 mg one tab daily with 10 mg one half tab daily  Labs today We will only contact you if something comes back abnormal or we need to make some changes. Otherwise no news is good news!  Labs needed in 7-10 days   You have been referred to [facility] cardiac rehab, they will call you to arrange orientation  Your physician recommends that you schedule a follow-up appointment in: 6 weeks   Do the following things EVERYDAY: 1) Weigh yourself in the morning before breakfast. Write it down and keep it in a log. 2) Take your medicines as prescribed 3) Eat low salt foods-Limit salt (sodium) to 2000 mg per day.  4) Stay as active as you can everyday 5) Limit all fluids for the day to less than 2 liters

## 2016-11-23 NOTE — Progress Notes (Signed)
Advanced Heart Failure Clinic Note   Patient ID: Marcus [name], male   DOB: [birth year], 83 y.o.   MRN: [id number] EP: Dr Lovena Le  Cardiology: Marcus [name] is an 83 yo with history of pacemaker for bradycardia, paroxysmal atrial fibrillation, CAD s/p CABG and PCIs, and ischemic cardiomyopathy EF 20-25%. Underwent CRT-D upgrade 10/08/13 due to 85% RV pacing.     Admitted 5/19-5/26/15 with volume overload. He was diuresed with milrinone and IV lasix and discharge weight was 153 lbs. He underwent upgrade of his ICD to CRT-D as well.  He was able to wean off milrinone and did much better after CRT upgrade.  He was admitted in 3/16 with pacemaker pocket infection.  CRT-D device was explanted and temporary-permanent pacemaker with placed.  He later had a St Jude CRT-P system re-implanted.  He was noted to be back in atrial fibrillation and had TEE-guided DCCV to NSR.  TEE showed EF 25-30%.    RHC in 10/17 showed near-normal filling pressures and low but not markedly low cardiac output.  Coreg was cut back to 6.25 mg bid.  Echo in 10/17 showed improvement in LV function, EF 35-40%.   Currently, he is taking torsemide 20 mg every other day.  Recently developed volume overload and took torsemide 20 mg daily x 1 week, now back to every other day.  Weight is down 6 lbs.  He is short of breath walking fast or walking uphill. He is short of breath after walking up stairs.  Generally ok walking on flat ground.  No chest pain.  Poor stamina.  No BRBPR or melena.    Corevue reviewed today: Stable thoracic impedance currently (volume overload 2-3 weeks ago), 96% BiV pacing, no atrial fibrillation.    ECG (personally reviewed): a-paced, BiV-paced  Labs (3/16): K 4.1 => 4.6, creatinine 1.87 => 1.97, HCT 34.4, TSH normal, LFTs normal.  Labs (6/16): K 4.2, creatinine 2.34 Labs (10/16): K 4.8, creatinine 2.29 Labs (6/17): K 5.1, creatinine 1.89, AST 30, ALT 25, TSH normal, LDL 159, HCT 37 Labs (10/17): K 5,  creatinine 2.45, TSH normal, hgb 12.7, BNP 453 Labs (11/17): LFTs normal Labs (12/17): hgb 11.7, creatinine 2.16 Labs (1/18): K 4.9, creatinine 2.54  PMH:  1. Symptomatic bradycardia: Medtronic CRT-D.  He developed pocket infection in 3/16 and had device extracted.  He had St Jude CRT-P system replaced.  2. HTN 3. Hyperlipidemia: Has refused statins.  4. Atrial fibrillation: History of prior DCCV.  Paroxysmal.  On Eliquis.  Has history of cardioembolism to right leg with right popliteal and tibial embolectomy (had been off Eliquis). Has had nausea with amiodarone in the past but now tolerating.  Recurrence in 3/16, had TEE-guided DCCV.  5. Prostate cancer 6. CAD: CABG remotely with SVG-OM and LIMA-LAD.  Late 1990s had PCI to RCA.  In 12/09 had LHC with total occlusion of SVG-OM, patent LIMA-LAD, DES to LCx and DES to RCA.   7. Ischemic cardiomyopathy: Echo (3/15) with EF 20-25%, wall motion abnormalities noted, mild to moderately decreased RV systolic function. ICD upgraded to [place] CRT-D 10/08/13.  TEE (3/16) with EF 25-30%, mildly dilated RV with mildly decreased systolic function, mild to moderate MR.  - RHC (10/17): mean RA 4, PA 44/21 mean 31, unable to wedge, CI 2.0. - Echo (10/17): EF 35-40%, moderate dilation, inferior/inferolateral AK, moderate MR, RV mildly dilated with mildly decreased systolic function.  8. CKD  SH: Married, nonsmoker, lives in [place]  FH: CAD  ROS: All  systems reviewed and negative except as per HPI.   Current Outpatient Prescriptions  Medication Sig Dispense Refill  . amiodarone (PACERONE) 200 MG tablet Take 0.5 tablets (100 mg total) by mouth daily. 45 tablet 3  . Ascorbic Acid (VITAMIN C) 1000 MG tablet Take 1,000 mg by mouth daily.    . ASTAXANTHIN PO Take by mouth daily.    Marland Kitchen b complex vitamins tablet Take 1 tablet by mouth 2 (two) times daily.    Marcus Loa Grape-Goldenseal (BERBERINE COMPLEX PO) Take by mouth 3 (three) times daily.    .  carvedilol (COREG) 6.25 MG tablet Take 1 tablet (6.25 mg total) by mouth 2 (two) times daily with a meal. 180 tablet 2  . Cholecalciferol (VITAMIN D PO) Take 1 capsule by mouth daily.     . Coenzyme Q10 (CO Q 10) 100 MG CAPS Take 1 capsule by mouth 2 (two) times daily.    Marland Kitchen ELIQUIS 2.5 MG TABS tablet TAKE 1 TABLET TWICE A DAY 180 tablet 1  . GLUTATHIONE PO Take by mouth daily.    . isosorbide mononitrate (IMDUR) 30 MG 24 hr tablet Take 0.5 tablets (15 mg total) by mouth daily. 45 tablet 3  . Misc Natural Products (GNP RESVERATROL RED WINE EXT PO) Take by mouth daily.    . Multiple Vitamins-Minerals (MULTIVITAMINS/MINERALS ADULT PO) Take by mouth daily. MacuGuard    . Multiple Vitamins-Minerals (PRESERVISION AREDS 2 PO) Take 1 capsule by mouth 2 (two) times daily.    Marland Kitchen Specialty Vitamins Products (LONGEVITY PLUS PO) Take by mouth daily.    Marland Kitchen spironolactone (ALDACTONE) 25 MG tablet TAKE ONE-HALF TABLET BY MOUTH ONCE DAILY 45 tablet 3  . torsemide (DEMADEX) 20 MG tablet Take one tab daily alternating with one half tab daily 45 tablet 3  . Turmeric, Curcuma Longa, (CURCUMIN) POWD Take by mouth daily.    . Vitamin D-Vitamin K (VITAMIN K2-VITAMIN D3 PO) Take by mouth daily. Bone Restore    . vitamin E 400 UNIT capsule Take 400 Units by mouth daily.     No current facility-administered medications for this encounter.     Vitals:   11/22/16 1105  BP: (!) 92/58  Pulse: (!) 108  SpO2: 99%  Weight: 167 lb 12.8 oz (76.1 kg)   Wt Readings from Last 3 Encounters:  11/22/16 167 lb 12.8 oz (76.1 kg)  10/17/16 168 lb (76.2 kg)  09/24/16 164 lb (74.4 kg)    General: NAD Neck: JVP 8-9 cm, no thyromegaly or thyroid nodule.  Lungs: CTAB CV: Nondisplaced PMI.  Heart regular S1/S2, occasional ectopy 1/6 SEM.   No carotid bruit.  Normal pedal pulses.  Abdomen: Soft, nontender, no hepatosplenomegaly, no distention.  Skin: Intact without lesions or rashes.  Neurologic: Alert and oriented x 3.  Psych:  Normal affect. Extremities: No clubbing or cyanosis. 1+ bilateral ankle edema.  Assessment/Plan:  1. Chronic systolic CHF: Ischemic cardiomyopathy.  EF 25-30% on 3/16 TEE, improved to 35-40% on most recent echo in 10/17.  Has St Jude CRT-P device.  On exam today, he is volume overloaded (though Corevue suggests improving volume).  NYHA class III symptoms.  BP is soft, no room to titrate up HF meds.   - Increase torsemide to 20 mg daily alternating with 10 mg daily.  BMET today and repeat BMET in 2 wks.   - Continue current spironolactone 12.5 daily and Coreg 6.25 mg bid.   - Not on ACEI/ARB with CKD. - I will refer him to  cardiac rehab.  2. Atrial fibrillation: Paroxysmal. He is in NSR on amiodarone 100 mg daily. - Check LFTs.  Needs regular eye exam. - He is on Eliquis 2.5 mg bid (age, elevated creatinine).  CBC today.  3. CAD: Stable. No chest pain. Continue medical management.  - He is not currently on a statin (he has refused to take a statin).   - He is not on ASA given use of Eliquis and stable CAD.  4. Sick sinus syndrome: s/p CRT-P. 5. CKD, stage III: Creatinine has trended higher recently.  BMET today and again in 2 wks.    Followup in 6 wks.    Marcus [name] 11/23/2016

## 2016-11-27 ENCOUNTER — Encounter: Payer: Self-pay | Admitting: Internal Medicine

## 2016-11-27 ENCOUNTER — Ambulatory Visit (INDEPENDENT_AMBULATORY_CARE_PROVIDER_SITE_OTHER): Payer: Medicare Other | Admitting: Internal Medicine

## 2016-11-27 VITALS — BP 108/66 | HR 70 | Ht 68.5 in | Wt 168.0 lb

## 2016-11-27 DIAGNOSIS — I442 Atrioventricular block, complete: Secondary | ICD-10-CM | POA: Diagnosis not present

## 2016-11-27 DIAGNOSIS — Z950 Presence of cardiac pacemaker: Secondary | ICD-10-CM | POA: Diagnosis not present

## 2016-11-27 NOTE — Patient Instructions (Signed)
Medication Instructions:  Your physician recommends that you continue on your current medications as directed. Please refer to the Current Medication list given to you today.   Labwork: None ordered.   Testing/Procedures: None ordered.   Follow-Up: Your physician wants you to follow-up in: one year with Dr. Lovena Le.   You will receive a reminder letter in the mail two months in advance. If you don't receive a letter, please call our office to schedule the follow-up appointment.  Remote monitoring is used to monitor your Pacemaker from home. This monitoring reduces the number of office visits required to check your device to one time per year. It allows [place] to keep an eye on the functioning of your device to ensure it is working properly. You are scheduled for a device check from home on 02/26/2017. You may send your transmission at any time that day. If you have a wireless device, the transmission will be sent automatically. After your physician reviews your transmission, you will receive a postcard with your next transmission date.    Any Other Special Instructions Will Be Listed Below (If Applicable).     If you need a refill on your cardiac medications before your next appointment, please call your pharmacy.

## 2016-11-27 NOTE — Progress Notes (Signed)
HPI Mr. Westberg returns today for followup. He is a pleasant 83 yo man with CHB, and chronic systolic heart failure who underwent BiV PM insertion years ago and then developed erosion of his pocket and underwent extraction approx. 12 months ago followed by re-insertion of a biv pm on the contralateral side. He has done well in the interim with no infectious symptoms. He denies chest pain or sob. He remains active working outside. He exercises regularly. He had no specific complaints. He admits to some dietary indiscretion. No Known Allergies   Current Outpatient Prescriptions  Medication Sig Dispense Refill  . amiodarone (PACERONE) 200 MG tablet Take 0.5 tablets (100 mg total) by mouth daily. 45 tablet 3  . carvedilol (COREG) 6.25 MG tablet Take 1 tablet (6.25 mg total) by mouth 2 (two) times daily with a meal. 180 tablet 2  . Coenzyme Q10 (CO Q 10) 100 MG CAPS Take 1 capsule by mouth 2 (two) times daily.    Marland Kitchen ELIQUIS 2.5 MG TABS tablet TAKE 1 TABLET TWICE A DAY 180 tablet 1  . GLUTATHIONE PO Take by mouth daily. Take as directed    . isosorbide mononitrate (IMDUR) 30 MG 24 hr tablet Take 0.5 tablets (15 mg total) by mouth daily. 45 tablet 3  . Multiple Vitamins-Minerals (MULTIVITAMINS/MINERALS ADULT PO) Take by mouth daily. MacuGuard    . Multiple Vitamins-Minerals (PRESERVISION AREDS 2 PO) Take 1 capsule by mouth 2 (two) times daily.    Marland Kitchen spironolactone (ALDACTONE) 25 MG tablet TAKE ONE-HALF TABLET BY MOUTH ONCE DAILY 45 tablet 3  . torsemide (DEMADEX) 20 MG tablet Take one tab daily alternating with one half tab daily 45 tablet 3   No current facility-administered medications for this visit.      Past Medical History:  Diagnosis Date  . Atrial flutter ([place])    a. Dx 07/2014, s/p TEE/DCCV.  . Cataracts, bilateral   . Chronic systolic CHF (congestive heart failure) ([place] )    a. s/p CRT-D 09/2013. b. Device explant with temp perm then subsequent CRT-P 07/2014.  Marland Kitchen CKD (chronic  kidney disease), stage III   . Coronary artery disease    a. s/p remote CABG x 2(VG->OM, LIMA->LAD;  b. Late 90's s/p PCI of RCA;  c. 12/09 Cath/PCI: LM 46m, 70-80d (3.0x23mm Xience DES), LAD100p, LCX 80-90p (2.25x53mmTaxus Atom DES), RCA 100d (2.5x73mm Xience DES), VG->OM 100, LIMA->LAD nl, EF 30-35%.  Marland Kitchen DVT (deep venous thrombosis) ([place]) 2017   Right knee  . Hard of hearing   . History of colon polyps   . History of gastric ulcer   . History of gout   . History of shingles   . Hyperlipidemia   . Hypertension   . Ischemic cardiomyopathy    a. EF 35-40% by echo 05/2012, b. EF 20-25%, akinesis and scarring of inferolateral, inferior and inferoseptal myocardium, mild AI, mod MR, LA mod dilated, RV mildly dialted and sys fx mildly reduced, RA mildly dilated (09/2013)  c. EF 35-40% by 2D in 07/2014, 25-30% by TEE.  . Macular degeneration, dry   . Pacemaker infection ([place])   . PAF (paroxysmal atrial fibrillation) ([place])   . Peripheral vascular disease ([place])   . Presence of permanent cardiac pacemaker 07/23/2014   [facility] [id number], Serial [id number]  . Prostate cancer ([place])   . Symptomatic bradycardia    a. 08/2003 s/p MDT Enpulse [id number] Dual chamber PPM ser # [id number] H. b. Device explant due to infection/temp perm insertion/PPM  07/2014.    ROS:   All systems reviewed and negative except as noted in the HPI.   Past Surgical History:  Procedure Laterality Date  . ANGIOPLASTY     stent placement  . APPENDECTOMY    . BI-VENTRICULAR PACEMAKER INSERTION [name] 07/23/2014   Procedure: BI-VENTRICULAR PACEMAKER INSERTION (CRT-P);  Surgeon: Deboraha Sprang, MD;  Location[facility] CATH LAB;  Service: Cardiovascular;  Laterality: [name];  . BI-VENTRICULAR PACEMAKER UPGRADE [name] 10/08/2013   Procedure: BI-VENTRICULAR PACEMAKER UPGRADE;  Surgeon: [name], MD;  Location: [facility] CATH LAB;  Service: Cardiovascular;  Laterality: [name];  . CARDIAC CATHETERIZATION  most recent in 2009   several  . CARDIAC  CATHETERIZATION Right 07/19/2014   Procedure: TEMPORARY PACEMAKER;  Surgeon: [name], MD;  Location: [place];  Service: Cardiovascular;  Laterality: Right;  . CARDIAC CATHETERIZATION [name] 03/01/2016   Procedure: Right Heart Cath;  Surgeon: [name], MD;  Location: [place] CV LAB;  Service: Cardiovascular;  Laterality: [name];  . CARDIOVERSION  06/26/2012   Procedure: CARDIOVERSION;  Surgeon: Lelon Perla, MD;  [facility] ENDOSCOPY;  Service: Cardiovascular;  Laterality: [name];  . CARDIOVERSION [name] 07/27/2014   Procedure: CARDIOVERSION;  Surgeon: [name], MD;  Location: [place];  Service: Cardiovascular;  Laterality: [name];  . CATARACT EXTRACTION W/PHACO Left 09/24/2016   Procedure: CATARACT EXTRACTION PHACO AND INTRAOCULAR LENS PLACEMENT ([place])  Left;  Surgeon: Leandrew Koyanagi, MD;  Location: [place];  Service: Ophthalmology;  Laterality: Left;  . CATARACT EXTRACTION W/PHACO Right 10/17/2016   Procedure: CATARACT EXTRACTION PHACO AND INTRAOCULAR LENS PLACEMENT ([place])  Right;  Surgeon: Leandrew Koyanagi, MD;  Location: [place];  Service: Ophthalmology;  Laterality: Right;  prefers mid to late morning arrival  . COLONOSCOPY    . CORONARY ANGIOPLASTY     couple   . CORONARY ARTERY BYPASS GRAFT  1990   left main  . DOPPLER ECHOCARDIOGRAPHY  06/24/2002   EF 60-65%  . EMBOLECTOMY Right 07/28/2013   Procedure: Right Popliteal and Tibial Embolectomy;  Surgeon: Angelia Mould, MD;  Location: [place];  Service: Vascular;  Laterality: Right;  . INSERT / REPLACE / REMOVE PACEMAKER     1st pacemaker placed 10+yrs ago  . PACEMAKER LEAD REMOVAL Right 07/19/2014   Procedure: PACEMAKER LEAD REMOVAL/EXTRACTION;  Surgeon: [name], MD;  Location: [place];  Service: Cardiovascular;  Laterality: Right;  DR. Lucianne Lei TRIGT TO BACK UP CASE  . PERMANENT PACEMAKER GENERATOR CHANGE [name] 08/27/2012   Procedure: PERMANENT PACEMAKER GENERATOR CHANGE;  Surgeon: [name], MD;  Location: [facility] CATH LAB;  Service: Cardiovascular;  Laterality: [name];  . TEE WITHOUT CARDIOVERSION  06/26/2012   Procedure: TRANSESOPHAGEAL ECHOCARDIOGRAM (TEE);  Surgeon: Lelon Perla, MD;  Location: [facility] ENDOSCOPY;  Service: Cardiovascular;  Laterality: [name];  . TEE WITHOUT CARDIOVERSION [name] 07/27/2014   Procedure: TRANSESOPHAGEAL ECHOCARDIOGRAM (TEE);  Surgeon: [name], MD;  Location: [facility] ENDOSCOPY;  Service: Cardiovascular;  Laterality: [name];     Family History  Problem Relation Age of Onset  . Hypertension Mother   . Colon cancer Mother   . Cancer Mother        Colon  . Alcohol abuse Father      Social History   Social History  . Marital status: Married    Spouse name: [name]  . Number of children: 0  . Years of education: [name]   Occupational History  . retired    Social History Main Topics  .  Smoking status: Former Smoker    Quit date: 02/10/1971  . Smokeless tobacco: Never Used     Comment: quit smoking 61yrs ago  . Alcohol use No     Comment: nothing in 33yrs   . Drug use: No  . Sexual activity: No   Other Topics Concern  . Not on file   Social History Narrative   Lives in [place] with wife.  Retired.     BP 108/66   Pulse 70   Ht 5' 8.5" (1.74 m)   Wt 168 lb (76.2 kg)   SpO2 98%   BMI 25.17 kg/m   Physical Exam:  Well appearing 83 yo man, NAD HEENT: Unremarkable Neck:  6 cm JVD, no thyromegally Back:  No CVA tenderness Lungs:  Clear with no wheezes HEART:  Regular rate rhythm, no murmurs, no rubs, no clicks Abd:  soft, positive bowel sounds, no organomegally, no rebound, no guarding Ext:  2 plus pulses, no edema, no cyanosis, no clubbing Skin:  No rashes no nodules Neuro:  CN II through XII intact, motor grossly intact  ECG - nsr with BiV pacing  DEVICE  Normal device function.  See PaceArt for details.   Assess/Plan: 1. Complete heart block - he is stable, s/p PPM insertion 2. PPM - his medtronic CRT-P Biv PPM is working  normally. Will recheck in several months. 3. HTN - His blood pressure is well controlled. Will follow. 4. Chronic systolic heart failure - his symptoms are class 2. He will continue his current meds.  Mikle Bosworth.D.

## 2016-11-30 NOTE — Addendum Note (Signed)
Encounter addended by: Scarlette Calico, RN on: 11/30/2016 11:10 AM<BR>    Actions taken: Visit diagnoses modified, Order list changed, Diagnosis association updated

## 2016-12-05 ENCOUNTER — Ambulatory Visit (HOSPITAL_COMMUNITY)
Admission: RE | Admit: 2016-12-05 | Discharge: 2016-12-05 | Disposition: A | Discharge status: Home / Self Care | Payer: Medicare Other | Source: Ambulatory Visit | Attending: Cardiology | Admitting: Cardiology

## 2016-12-05 DIAGNOSIS — I5022 Chronic systolic (congestive) heart failure: Secondary | ICD-10-CM

## 2016-12-05 DIAGNOSIS — I480 Paroxysmal atrial fibrillation: Secondary | ICD-10-CM | POA: Diagnosis not present

## 2016-12-05 LAB — BASIC METABOLIC PANEL
Anion gap: 11 (ref 5–15)
BUN: 71 mg/dL — ABNORMAL HIGH (ref 6–20)
CHLORIDE: 105 mmol/L (ref 101–111)
CO2: 23 mmol/L (ref 22–32)
CREATININE: 2.87 mg/dL — AB (ref 0.61–1.24)
Calcium: 9.6 mg/dL (ref 8.9–10.3)
GFR calc non Af Amer: 19 mL/min — ABNORMAL LOW (ref >60)
GFR, EST AFRICAN AMERICAN: 22 mL/min — AB (ref >60)
Glucose, Bld: 121 mg/dL — ABNORMAL HIGH (ref 65–99)
Potassium: 4.3 mmol/L (ref 3.5–5.1)
SODIUM: 139 mmol/L (ref 135–145)

## 2016-12-07 LAB — CUP PACEART INCLINIC DEVICE CHECK
Battery Remaining Longevity: 68 mo
Battery Voltage: 2.98 V
Date Time Interrogation Session: 20180710203117
Implantable Lead Implant Date: 20160304
Implantable Lead Implant Date: 20160304
Implantable Lead Location: 753860
Lead Channel Impedance Value: 512.5 Ohm
Lead Channel Pacing Threshold Amplitude: 1 V
Lead Channel Pacing Threshold Amplitude: 1.25 V
Lead Channel Pacing Threshold Pulse Width: 0.5 ms
Lead Channel Pacing Threshold Pulse Width: 0.6 ms
Lead Channel Setting Pacing Amplitude: 2 V
Lead Channel Setting Pacing Amplitude: 2 V
MDC IDC LEAD IMPLANT DT: 20160304
MDC IDC LEAD LOCATION: 753858
MDC IDC LEAD LOCATION: 753859
MDC IDC MSMT LEADCHNL LV IMPEDANCE VALUE: 912.5 Ohm
MDC IDC MSMT LEADCHNL LV PACING THRESHOLD AMPLITUDE: 1 V
MDC IDC MSMT LEADCHNL LV PACING THRESHOLD AMPLITUDE: 1 V
MDC IDC MSMT LEADCHNL LV PACING THRESHOLD PULSEWIDTH: 0.6 ms
MDC IDC MSMT LEADCHNL RA IMPEDANCE VALUE: 462.5 Ohm
MDC IDC MSMT LEADCHNL RA PACING THRESHOLD AMPLITUDE: 1.25 V
MDC IDC MSMT LEADCHNL RA PACING THRESHOLD PULSEWIDTH: 1 ms
MDC IDC MSMT LEADCHNL RA PACING THRESHOLD PULSEWIDTH: 1 ms
MDC IDC MSMT LEADCHNL RA SENSING INTR AMPL: 1.6 mV
MDC IDC MSMT LEADCHNL RV PACING THRESHOLD AMPLITUDE: 1 V
MDC IDC MSMT LEADCHNL RV PACING THRESHOLD PULSEWIDTH: 0.5 ms
MDC IDC MSMT LEADCHNL RV SENSING INTR AMPL: 5.9 mV
MDC IDC PG IMPLANT DT: 20160304
MDC IDC SET LEADCHNL LV PACING PULSEWIDTH: 0.6 ms
MDC IDC SET LEADCHNL RA PACING AMPLITUDE: 2.5 V
MDC IDC SET LEADCHNL RV PACING PULSEWIDTH: 0.5 ms
MDC IDC SET LEADCHNL RV SENSING SENSITIVITY: 5 mV
MDC IDC STAT BRADY RA PERCENT PACED: 98 %
MDC IDC STAT BRADY RV PERCENT PACED: 96 %
Pulse Gen Model: 3242
Pulse Gen Serial Number: 7725037

## 2016-12-10 ENCOUNTER — Telehealth: Payer: Self-pay

## 2016-12-10 ENCOUNTER — Ambulatory Visit (INDEPENDENT_AMBULATORY_CARE_PROVIDER_SITE_OTHER): Payer: Self-pay

## 2016-12-10 DIAGNOSIS — Z950 Presence of cardiac pacemaker: Secondary | ICD-10-CM

## 2016-12-10 DIAGNOSIS — I5022 Chronic systolic (congestive) heart failure: Secondary | ICD-10-CM

## 2016-12-10 NOTE — Progress Notes (Signed)
EPIC Encounter for ICM Monitoring  Patient Name: YASHAS CAMILLI is a 83 y.o. male Date: 12/10/2016 Primary Care Physican: Patient, No Pcp Per Primary Cardiologist:[place] Electrophysiologist: Lovena Le Dry Weight: unknown Bi-V Pacing: >99%           Attempted call to wife and left detailed message regarding transmission.  Transmission reviewed.    Thoracic impedance normal.  Prescribed dosage: Torsemide 20 mg daily 1 tablet daily alternating with 0.5 tablet daily.   Labs: 12/05/2016 Creatinine 2.87, BUN 71, Potassium 4.3, Sodium 139, EGFR 19-22 11/22/2016 Creatinine 2.57, BUN 61, Potassium 4.3, Sodium 139, EGFR 21-25  06/14/2016 Creatinine 2.54. BUN 59, Potassium 4.9, Sodium 137, EGFR 22-25 06/01/2016 Creatinine 2.78, BUN 78, Potassium 4.5, Sodium 136, EGFR 19-22  05/22/2016 Creatinine 2.38, BUN 60, Potassium 4.7, Sodium 136, EGFR 23-27 05/08/2016 Creatinine 2.16, BUN 40, Potassium 4.5, Sodium 143, EGFR 26-30 03/26/2016 Creatinine 1.77, BUN 26, Potassium 4.3, Sodium 140, EGFR 33-39 02/21/2016 Creatinine 2.45, BUN 75, Potassium 5.0, Sodium 138, EGFR 23-26  10/05/2016Creatinine 2.29, BUN 49, Potassium 4.8, Sodium 137, EGFR 25-29  Recommendations: Left voice mail with ICM number and encouraged to call for fluid symptoms.  Follow-up plan: ICM clinic phone appointment on 12/31/2016.  Office appointment scheduled 01/09/2017 with Dr. Aundra Dubin.  Copy of ICM check sent to device physician.   3 month ICM trend: 12/10/2016   1 Year ICM trend:      Rosalene Billings, RN 12/10/2016 9:05 AM

## 2016-12-10 NOTE — Telephone Encounter (Signed)
Remote ICM transmission received.  Attempted call to wife and left detailed message regarding transmission and next ICM scheduled for 12/31/2016.  Advised to return call for any fluid symptoms or questions.

## 2016-12-14 ENCOUNTER — Other Ambulatory Visit (HOSPITAL_COMMUNITY): Payer: Self-pay | Admitting: Cardiology

## 2016-12-14 MED ORDER — APIXABAN 2.5 MG PO TABS: 2.5000 mg | ORAL_TABLET | Freq: Two times a day (BID) | ORAL | 3 refills | Status: DC

## 2016-12-27 ENCOUNTER — Other Ambulatory Visit (HOSPITAL_COMMUNITY): Payer: Self-pay | Admitting: Cardiology

## 2016-12-27 MED ORDER — ISOSORBIDE MONONITRATE ER 30 MG PO TB24: 15.0000 mg | ORAL_TABLET | Freq: Every day | ORAL | 3 refills | Status: DC

## 2016-12-27 MED ORDER — SPIRONOLACTONE 25 MG PO TABS: ORAL_TABLET | ORAL | 3 refills | Status: DC

## 2016-12-31 ENCOUNTER — Ambulatory Visit (INDEPENDENT_AMBULATORY_CARE_PROVIDER_SITE_OTHER): Payer: Medicare Other

## 2016-12-31 ENCOUNTER — Telehealth: Payer: Self-pay

## 2016-12-31 ENCOUNTER — Ambulatory Visit (INDEPENDENT_AMBULATORY_CARE_PROVIDER_SITE_OTHER): Payer: Medicare Other | Admitting: *Deleted

## 2016-12-31 DIAGNOSIS — Z950 Presence of cardiac pacemaker: Secondary | ICD-10-CM

## 2016-12-31 DIAGNOSIS — I442 Atrioventricular block, complete: Secondary | ICD-10-CM

## 2016-12-31 DIAGNOSIS — I5022 Chronic systolic (congestive) heart failure: Secondary | ICD-10-CM

## 2016-12-31 DIAGNOSIS — I480 Paroxysmal atrial fibrillation: Secondary | ICD-10-CM

## 2016-12-31 NOTE — Progress Notes (Signed)
Remote ICD Transmission.

## 2016-12-31 NOTE — Telephone Encounter (Signed)
Remote ICM transmission received.  Attempted patient call and left message to return call.   

## 2016-12-31 NOTE — Progress Notes (Signed)
EPIC Encounter for ICM Monitoring  Patient Name: Marcus [name] is a 83 y.o. male Date: 12/31/2016 Primary Care Physican: Patient, No Pcp Per Primary Cardiologist:[place] Electrophysiologist: Lovena Le Dry Weight: unknown Bi-V Pacing: >99%      Attempted call to wife and unable to reach.  Left message to return call.  Transmission reviewed.    Thoracic impedance abnormal suggesting fluid accumulation since 12/29/2016.  Impedance also abnormal 7/23 to 8/1.  Prescribed dosage: Torsemide 20 mg daily 1 tablet daily alternating with 0.5 tablet daily.   Labs: 12/05/2016 Creatinine 2.87, BUN 71, Potassium 4.3, Sodium 139, EGFR 19-22 11/22/2016 Creatinine 2.57, BUN 61, Potassium 4.3, Sodium 139, EGFR 21-25  06/14/2016 Creatinine 2.54. BUN 59, Potassium 4.9, Sodium 137, EGFR 22-25 06/01/2016 Creatinine 2.78, BUN 78, Potassium 4.5, Sodium 136, EGFR 19-22  05/22/2016 Creatinine 2.38, BUN 60, Potassium 4.7, Sodium 136, EGFR 23-27 05/08/2016 Creatinine 2.16, BUN 40, Potassium 4.5, Sodium 143, EGFR 26-30 03/26/2016 Creatinine 1.77, BUN 26, Potassium 4.3, Sodium 140, EGFR 33-39 02/21/2016 Creatinine 2.45, BUN 75, Potassium 5.0, Sodium 138, EGFR 23-26  10/05/2016Creatinine 2.29, BUN 49, Potassium 4.8, Sodium 137, EGFR 25-29  Recommendations: NONE - Unable to reach    Follow-up plan: ICM clinic phone appointment on 01/31/2017 since patient has office appointment scheduled 01/09/2017 with Dr. Aundra Dubin.  Copy of ICM check sent to Dr Aundra Dubin and Dr Lovena Le for review.    3 month ICM trend: 12/31/2016   1 Year ICM trend:      Marcus Billings, RN 12/31/2016 12:13 PM

## 2017-01-01 LAB — CUP PACEART REMOTE DEVICE CHECK
Battery Remaining Longevity: 67 mo
Battery Remaining Percentage: 95.5 %
Battery Voltage: 2.96 V
Brady Statistic RA Percent Paced: 92 %
Implantable Lead Implant Date: 20160304
Implantable Lead Location: 753859
Implantable Lead Location: 753860
Implantable Pulse Generator Implant Date: 20160304
Lead Channel Impedance Value: 440 Ohm
Lead Channel Pacing Threshold Amplitude: 1.25 V
Lead Channel Pacing Threshold Pulse Width: 1 ms
Lead Channel Sensing Intrinsic Amplitude: 1.4 mV
Lead Channel Setting Pacing Amplitude: 2 V
Lead Channel Setting Pacing Amplitude: 2.125
Lead Channel Setting Pacing Pulse Width: 0.5 ms
Lead Channel Setting Pacing Pulse Width: 0.6 ms
MDC IDC LEAD IMPLANT DT: 20160304
MDC IDC LEAD IMPLANT DT: 20160304
MDC IDC LEAD LOCATION: 753858
MDC IDC MSMT LEADCHNL LV IMPEDANCE VALUE: 840 Ohm
MDC IDC MSMT LEADCHNL LV PACING THRESHOLD AMPLITUDE: 1 V
MDC IDC MSMT LEADCHNL LV PACING THRESHOLD PULSEWIDTH: 0.6 ms
MDC IDC MSMT LEADCHNL RV IMPEDANCE VALUE: 480 Ohm
MDC IDC MSMT LEADCHNL RV PACING THRESHOLD AMPLITUDE: 1.125 V
MDC IDC MSMT LEADCHNL RV PACING THRESHOLD PULSEWIDTH: 0.5 ms
MDC IDC MSMT LEADCHNL RV SENSING INTR AMPL: 12 mV
MDC IDC SESS DTM: 20180813060016
MDC IDC SET LEADCHNL RA PACING AMPLITUDE: 2.5 V
MDC IDC SET LEADCHNL RV SENSING SENSITIVITY: 5 mV
MDC IDC STAT BRADY AP VP PERCENT: 93 %
MDC IDC STAT BRADY AP VS PERCENT: 1 %
MDC IDC STAT BRADY AS VP PERCENT: 6.9 %
MDC IDC STAT BRADY AS VS PERCENT: 1 %
Pulse Gen Model: 3242
Pulse Gen Serial Number: 7725037

## 2017-01-09 ENCOUNTER — Encounter (HOSPITAL_COMMUNITY): Payer: Self-pay | Admitting: Cardiology

## 2017-01-09 ENCOUNTER — Ambulatory Visit (HOSPITAL_COMMUNITY)
Admission: RE | Admit: 2017-01-09 | Discharge: 2017-01-09 | Disposition: A | Discharge status: Home / Self Care | Payer: Medicare Other | Source: Ambulatory Visit | Attending: Cardiology | Admitting: Cardiology

## 2017-01-09 VITALS — BP 121/68 | HR 71 | Wt 171.0 lb

## 2017-01-09 DIAGNOSIS — N183 Chronic kidney disease, stage 3 unspecified: Secondary | ICD-10-CM

## 2017-01-09 DIAGNOSIS — I25810 Atherosclerosis of coronary artery bypass graft(s) without angina pectoris: Secondary | ICD-10-CM | POA: Insufficient documentation

## 2017-01-09 DIAGNOSIS — I255 Ischemic cardiomyopathy: Secondary | ICD-10-CM | POA: Insufficient documentation

## 2017-01-09 DIAGNOSIS — I2510 Atherosclerotic heart disease of native coronary artery without angina pectoris: Secondary | ICD-10-CM | POA: Insufficient documentation

## 2017-01-09 DIAGNOSIS — Z8546 Personal history of malignant neoplasm of prostate: Secondary | ICD-10-CM | POA: Insufficient documentation

## 2017-01-09 DIAGNOSIS — Z7901 Long term (current) use of anticoagulants: Secondary | ICD-10-CM | POA: Insufficient documentation

## 2017-01-09 DIAGNOSIS — I495 Sick sinus syndrome: Secondary | ICD-10-CM | POA: Insufficient documentation

## 2017-01-09 DIAGNOSIS — I130 Hypertensive heart and chronic kidney disease with heart failure and stage 1 through stage 4 chronic kidney disease, or unspecified chronic kidney disease: Secondary | ICD-10-CM | POA: Diagnosis not present

## 2017-01-09 DIAGNOSIS — I480 Paroxysmal atrial fibrillation: Secondary | ICD-10-CM | POA: Diagnosis not present

## 2017-01-09 DIAGNOSIS — I5022 Chronic systolic (congestive) heart failure: Secondary | ICD-10-CM | POA: Diagnosis not present

## 2017-01-09 DIAGNOSIS — Z8249 Family history of ischemic heart disease and other diseases of the circulatory system: Secondary | ICD-10-CM | POA: Diagnosis not present

## 2017-01-09 DIAGNOSIS — E785 Hyperlipidemia, unspecified: Secondary | ICD-10-CM | POA: Diagnosis not present

## 2017-01-09 LAB — BRAIN NATRIURETIC PEPTIDE: B NATRIURETIC PEPTIDE 5: 816.2 pg/mL — AB (ref 0.0–100.0)

## 2017-01-09 LAB — COMPREHENSIVE METABOLIC PANEL
ALBUMIN: 3.5 g/dL (ref 3.5–5.0)
ALT: 13 U/L — ABNORMAL LOW (ref 17–63)
ANION GAP: 7 (ref 5–15)
AST: 25 U/L (ref 15–41)
Alkaline Phosphatase: 34 U/L — ABNORMAL LOW (ref 38–126)
BILIRUBIN TOTAL: 0.8 mg/dL (ref 0.3–1.2)
BUN: 49 mg/dL — ABNORMAL HIGH (ref 6–20)
CO2: 26 mmol/L (ref 22–32)
Calcium: 9.4 mg/dL (ref 8.9–10.3)
Chloride: 108 mmol/L (ref 101–111)
Creatinine, Ser: 2.31 mg/dL — ABNORMAL HIGH (ref 0.61–1.24)
GFR calc Af Amer: 28 mL/min — ABNORMAL LOW (ref >60)
GFR calc non Af Amer: 24 mL/min — ABNORMAL LOW (ref >60)
GLUCOSE: 105 mg/dL — AB (ref 65–99)
POTASSIUM: 4.1 mmol/L (ref 3.5–5.1)
SODIUM: 141 mmol/L (ref 135–145)
TOTAL PROTEIN: 6.9 g/dL (ref 6.5–8.1)

## 2017-01-09 LAB — TSH: TSH: 1.301 u[IU]/mL (ref 0.350–4.500)

## 2017-01-09 NOTE — Patient Instructions (Signed)
Labs today (will call for abnormal results, otherwise no news is good news)  Please call Cardiac Rehab to schedule your appointments [phone])  Follow up appointment is scheduled for Monday November 19th @ 11:00 AM

## 2017-01-10 ENCOUNTER — Encounter: Payer: Self-pay | Admitting: Cardiology

## 2017-01-11 NOTE — Progress Notes (Signed)
Advanced Heart Failure Clinic Note   Patient ID: Marcus [name], male   DOB: [birth year], 83 y.o.   MRN: [id number] EP: Dr Lovena Le  Cardiology: Marcus [name] is an 83 yo with history of pacemaker for bradycardia, paroxysmal atrial fibrillation, CAD s/p CABG and PCIs, and ischemic cardiomyopathy EF 20-25%. Underwent CRT-D upgrade 10/08/13 due to 85% RV pacing.     Admitted 5/19-5/26/15 with volume overload. He was diuresed with milrinone and IV lasix and discharge weight was 153 lbs. He underwent upgrade of his ICD to CRT-D as well.  He was able to wean off milrinone and did much better after CRT upgrade.  He was admitted in 3/16 with pacemaker pocket infection.  CRT-D device was explanted and temporary-permanent pacemaker with placed.  He later had a St Jude CRT-P system re-implanted.  He was noted to be back in atrial fibrillation and had TEE-guided DCCV to NSR.  TEE showed EF 25-30%.    RHC in 10/17 showed near-normal filling pressures and low but not markedly low cardiac output.  Coreg was cut back to 6.25 mg bid.  Echo in 10/17 showed improvement in LV function, EF 35-40%.   Currently, he is taking torsemide 20 mg daily alternating with 10 mg daily.  Symptomatically stable. No dyspnea walking on flat ground.  He is short of breath walking up stairs.  No orthopnea/PND.  No lightheadedness/syncope.  No chest pain.    Corevue reviewed today: Stable thoracic impedance currently (volume up earlier this month), >99% BiV pacing, <1% atrial fibrillation.    Labs (3/16): K 4.1 => 4.6, creatinine 1.87 => 1.97, HCT 34.4, TSH normal, LFTs normal.  Labs (6/16): K 4.2, creatinine 2.34 Labs (10/16): K 4.8, creatinine 2.29 Labs (6/17): K 5.1, creatinine 1.89, AST 30, ALT 25, TSH normal, LDL 159, HCT 37 Labs (10/17): K 5, creatinine 2.45, TSH normal, hgb 12.7, BNP 453 Labs (11/17): LFTs normal Labs (12/17): hgb 11.7, creatinine 2.16 Labs (1/18): K 4.9, creatinine 2.54 Labs (7/18): K 4.3, creatinine 2.87,  hgb 12.5  PMH:  1. Symptomatic bradycardia: Medtronic CRT-D.  He developed pocket infection in 3/16 and had device extracted.  He had St Jude CRT-P system replaced.  2. HTN 3. Hyperlipidemia: Has refused statins.  4. Atrial fibrillation: History of prior DCCV.  Paroxysmal.  On Eliquis.  Has history of cardioembolism to right leg with right popliteal and tibial embolectomy (had been off Eliquis). Has had nausea with amiodarone in the past but now tolerating.  Recurrence in 3/16, had TEE-guided DCCV.  5. Prostate cancer 6. CAD: CABG remotely with SVG-OM and LIMA-LAD.  Late 1990s had PCI to RCA.  In 12/09 had LHC with total occlusion of SVG-OM, patent LIMA-LAD, DES to LCx and DES to RCA.   7. Ischemic cardiomyopathy: Echo (3/15) with EF 20-25%, wall motion abnormalities noted, mild to moderately decreased RV systolic function. ICD upgraded to [place] CRT-D 10/08/13.  TEE (3/16) with EF 25-30%, mildly dilated RV with mildly decreased systolic function, mild to moderate MR.  - RHC (10/17): mean RA 4, PA 44/21 mean 31, unable to wedge, CI 2.0. - Echo (10/17): EF 35-40%, moderate dilation, inferior/inferolateral AK, moderate MR, RV mildly dilated with mildly decreased systolic function.  8. CKD  SH: Married, nonsmoker, lives in [place]  [place]: CAD  ROS: All systems reviewed and negative except as per HPI.   Current Outpatient Prescriptions  Medication Sig Dispense Refill  . amiodarone (PACERONE) 200 MG tablet Take 0.5 tablets (100 mg total) by  mouth daily. 45 tablet 3  . apixaban (ELIQUIS) 2.5 MG TABS tablet Take 1 tablet (2.5 mg total) by mouth 2 (two) times daily. 180 tablet 3  . carvedilol (COREG) 6.25 MG tablet Take 1 tablet (6.25 mg total) by mouth 2 (two) times daily with a meal. 180 tablet 2  . Coenzyme Q10 (CO Q 10) 100 MG CAPS Take 1 capsule by mouth 2 (two) times daily.    Marland Kitchen GLUTATHIONE PO Take by mouth daily. Take as directed    . isosorbide mononitrate (IMDUR) 30 MG 24 hr tablet Take  0.5 tablets (15 mg total) by mouth daily. 45 tablet 3  . Multiple Vitamins-Minerals (MULTIVITAMINS/MINERALS ADULT PO) Take by mouth daily. MacuGuard    . Multiple Vitamins-Minerals (PRESERVISION AREDS 2 PO) Take 1 capsule by mouth 2 (two) times daily.    Marland Kitchen spironolactone (ALDACTONE) 25 MG tablet TAKE ONE-HALF TABLET BY MOUTH ONCE DAILY 45 tablet 3  . torsemide (DEMADEX) 20 MG tablet Take one tab daily alternating with one half tab daily 45 tablet 3   No current facility-administered medications for this encounter.     Vitals:   01/09/17 1141  BP: 121/68  Pulse: 71  SpO2: 100%  Weight: 171 lb (77.6 kg)   Wt Readings from Last 3 Encounters:  01/09/17 171 lb (77.6 kg)  11/27/16 168 lb (76.2 kg)  11/22/16 167 lb 12.8 oz (76.1 kg)    General: NAD Neck: No JVD, no thyromegaly or thyroid nodule.  Lungs: Clear to auscultation bilaterally with normal respiratory effort. CV: Lateral PMI.  Heart regular S1/S2, no S3/S4, 1/6 SEM RUSB.  Trace ankle edema.  No carotid bruit.  Normal pedal pulses.  Abdomen: Soft, nontender, no hepatosplenomegaly, no distention.  Skin: Intact without lesions or rashes.  Neurologic: Alert and oriented x 3.  Psych: Normal affect. Extremities: No clubbing or cyanosis.  HEENT: Normal.    Assessment/Plan:  1. Chronic systolic CHF: Ischemic cardiomyopathy.  EF 25-30% on 3/16 TEE, improved to 35-40% on most recent echo in 10/17.  Has St Jude CRT-P device.  On exam today and by Corevue, volume looks ok.  NYHA class III symptoms chronically.  He does not tolerate uptitration of meds well.   - Continue torsemide 20 mg daily alternating with 10 mg daily.  BMET today.   - Continue current spironolactone 12.5 daily and Coreg 6.25 mg bid.   - Not on ACEI/ARB with CKD. - I will refer him again to cardiac rehab, has not heard from them yet.  2. Atrial fibrillation: Paroxysmal. He is in NSR on amiodarone 100 mg daily. - Check LFT and TSH today.  Needs regular eye exam. - He  is on Eliquis 2.5 mg bid (age, elevated creatinine).  3. CAD: Stable. No chest pain. Continue medical management.  - He is not currently on a statin (he has refused to take a statin).   - He is not on ASA given use of Eliquis and stable CAD.  4. Sick sinus syndrome: s/p CRT-P. 5. CKD, stage III: Creatinine has trended higher recently.  BMET today.    Followup in 3 months.    [name] 01/11/2017

## 2017-01-17 ENCOUNTER — Other Ambulatory Visit: Payer: Self-pay | Admitting: Internal Medicine

## 2017-01-19 ENCOUNTER — Telehealth: Payer: Self-pay | Admitting: Cardiology

## 2017-01-19 NOTE — Telephone Encounter (Signed)
Pt's wife called- he is having increasing LE edema. I suggested he increase his Torsemide to 20 mg daily. They asked if he could be seen earlier if follow up and I will send the schedulers a message.   Kerin Ransom PA-C 01/19/2017 10:12 AM

## 2017-01-22 ENCOUNTER — Other Ambulatory Visit (HOSPITAL_COMMUNITY): Payer: Self-pay | Admitting: Cardiology

## 2017-01-25 ENCOUNTER — Encounter: Payer: Self-pay | Admitting: Cardiology

## 2017-01-29 ENCOUNTER — Encounter (HOSPITAL_COMMUNITY): Payer: Medicare Other

## 2017-01-31 ENCOUNTER — Ambulatory Visit (INDEPENDENT_AMBULATORY_CARE_PROVIDER_SITE_OTHER): Payer: Medicare Other

## 2017-01-31 DIAGNOSIS — Z950 Presence of cardiac pacemaker: Secondary | ICD-10-CM

## 2017-01-31 DIAGNOSIS — I5022 Chronic systolic (congestive) heart failure: Secondary | ICD-10-CM | POA: Diagnosis not present

## 2017-01-31 NOTE — Progress Notes (Signed)
EPIC Encounter for ICM Monitoring  Patient Name: Marcus [name] is a 83 y.o. male Date: 01/31/2017 Primary Care Physican: Patient, No Pcp Per Primary Cardiologist:[facility] Electrophysiologist: Lovena Le Dry Weight: unknown Bi-V Pacing: >99%          Spoke with wife.  Heart Failure questions reviewed, pt feeling tired and no other symptoms at this time.  She stated feet and legs were swollen on 9/1 and called the office on call physician and Torsemide was increased.     Thoracic impedance abnormal suggesting fluid accumulation with a pattern multiple peaks and valleys but appears it may be at baseline today.  Prescribed dosage: Torsemide 20 mg daily 1 tablet daily (was increased 9/1 by Kerin Ransom, PA on Heart Failure call).   Labs: 01/09/2017 Creatinine 2.31, BUN 49, Potassium 4.1, Sodium 141, EGFR 24-28 12/05/2016 Creatinine 2.87, BUN 71, Potassium 4.3, Sodium 139, EGFR 19-22 11/22/2016 Creatinine 2.57, BUN 61, Potassium 4.3, Sodium 139, EGFR 21-25  06/14/2016 Creatinine 2.54. BUN 59, Potassium 4.9, Sodium 137, EGFR 22-25 06/01/2016 Creatinine 2.78, BUN 78, Potassium 4.5, Sodium 136, EGFR 19-22  05/22/2016 Creatinine 2.38, BUN 60, Potassium 4.7, Sodium 136, EGFR 23-27 05/08/2016 Creatinine 2.16, BUN 40, Potassium 4.5, Sodium 143, EGFR 26-30 03/26/2016 Creatinine 1.77, BUN 26, Potassium 4.3, Sodium 140, EGFR 33-39 02/21/2016 Creatinine 2.45, BUN 75, Potassium 5.0, Sodium 138, EGFR 23-26  10/05/2016Creatinine 2.29, BUN 49, Potassium 4.8, Sodium 137, EGFR 25-29  Recommendations:  Wife stated patient is eating out more often and does not think he is following low salt diet.  Encouraged to review food labels to determine the amount of sodium intake and should be <2000 mg daily.    Follow-up plan: ICM clinic phone appointment on 02/08/2017 to recheck fluid levels.  Office appointment scheduled 02/01/2017 with Dr. Aundra Dubin.  Copy of ICM check sent to Dr. Aundra Dubin and Dr. Lovena Le.   3 month  ICM trend: 01/31/2017   1 Year ICM trend:      Rosalene Billings, RN 01/31/2017 9:06 AM

## 2017-02-01 ENCOUNTER — Encounter (HOSPITAL_COMMUNITY): Payer: Self-pay

## 2017-02-01 ENCOUNTER — Encounter (HOSPITAL_COMMUNITY)
Admission: RE | Admit: 2017-02-01 | Discharge: 2017-02-01 | Disposition: A | Discharge status: Home / Self Care | Payer: Medicare Other | Source: Ambulatory Visit | Attending: Cardiology | Admitting: Cardiology

## 2017-02-01 ENCOUNTER — Ambulatory Visit (HOSPITAL_COMMUNITY)
Admission: RE | Admit: 2017-02-01 | Discharge: 2017-02-01 | Disposition: A | Discharge status: Home / Self Care | Payer: Medicare Other | Source: Ambulatory Visit | Attending: Internal Medicine | Admitting: Internal Medicine

## 2017-02-01 VITALS — BP 115/59 | HR 77 | Wt 172.6 lb

## 2017-02-01 VITALS — BP 116/81 | HR 73 | Resp 18 | Ht 67.0 in | Wt 172.4 lb

## 2017-02-01 DIAGNOSIS — I739 Peripheral vascular disease, unspecified: Secondary | ICD-10-CM | POA: Diagnosis not present

## 2017-02-01 DIAGNOSIS — Z87891 Personal history of nicotine dependence: Secondary | ICD-10-CM | POA: Insufficient documentation

## 2017-02-01 DIAGNOSIS — I110 Hypertensive heart disease with heart failure: Secondary | ICD-10-CM | POA: Insufficient documentation

## 2017-02-01 DIAGNOSIS — M545 Low back pain, unspecified: Secondary | ICD-10-CM

## 2017-02-01 DIAGNOSIS — I2510 Atherosclerotic heart disease of native coronary artery without angina pectoris: Secondary | ICD-10-CM | POA: Diagnosis not present

## 2017-02-01 DIAGNOSIS — I255 Ischemic cardiomyopathy: Secondary | ICD-10-CM | POA: Insufficient documentation

## 2017-02-01 DIAGNOSIS — Z950 Presence of cardiac pacemaker: Secondary | ICD-10-CM | POA: Insufficient documentation

## 2017-02-01 DIAGNOSIS — I495 Sick sinus syndrome: Secondary | ICD-10-CM | POA: Insufficient documentation

## 2017-02-01 DIAGNOSIS — I1310 Hypertensive heart and chronic kidney disease without heart failure, with stage 1 through stage 4 chronic kidney disease, or unspecified chronic kidney disease: Secondary | ICD-10-CM | POA: Insufficient documentation

## 2017-02-01 DIAGNOSIS — E785 Hyperlipidemia, unspecified: Secondary | ICD-10-CM | POA: Insufficient documentation

## 2017-02-01 DIAGNOSIS — Z79899 Other long term (current) drug therapy: Secondary | ICD-10-CM | POA: Insufficient documentation

## 2017-02-01 DIAGNOSIS — I4892 Unspecified atrial flutter: Secondary | ICD-10-CM | POA: Diagnosis not present

## 2017-02-01 DIAGNOSIS — Z7901 Long term (current) use of anticoagulants: Secondary | ICD-10-CM | POA: Insufficient documentation

## 2017-02-01 DIAGNOSIS — I5022 Chronic systolic (congestive) heart failure: Secondary | ICD-10-CM | POA: Insufficient documentation

## 2017-02-01 DIAGNOSIS — Z8546 Personal history of malignant neoplasm of prostate: Secondary | ICD-10-CM | POA: Insufficient documentation

## 2017-02-01 DIAGNOSIS — Z951 Presence of aortocoronary bypass graft: Secondary | ICD-10-CM | POA: Insufficient documentation

## 2017-02-01 DIAGNOSIS — N183 Chronic kidney disease, stage 3 (moderate): Secondary | ICD-10-CM | POA: Diagnosis not present

## 2017-02-01 DIAGNOSIS — I480 Paroxysmal atrial fibrillation: Secondary | ICD-10-CM | POA: Insufficient documentation

## 2017-02-01 DIAGNOSIS — I130 Hypertensive heart and chronic kidney disease with heart failure and stage 1 through stage 4 chronic kidney disease, or unspecified chronic kidney disease: Secondary | ICD-10-CM | POA: Diagnosis not present

## 2017-02-01 MED ORDER — TORSEMIDE 20 MG PO TABS: ORAL_TABLET | ORAL | 6 refills | Status: DC

## 2017-02-01 NOTE — Patient Instructions (Signed)
Increase torsemide to 40 mg (2 tabs) daily for 3 days, then take 40 mg (2 tabs) daily alternating with 20 mg (1 tab) daily.  Follow up next week with Dr. Aundra Dubin.  __________________________________________________________________________  __________________________________________________________________________  Take all medication as prescribed the day of your appointment. Bring all medications with you to your appointment.  Do the following things EVERYDAY: 1) Weigh yourself in the morning before breakfast. Write it down and keep it in a log. 2) Take your medicines as prescribed 3) Eat low salt foods-Limit salt (sodium) to 2000 mg per day.  4) Stay as active as you can everyday 5) Limit all fluids for the day to less than 2 liters

## 2017-02-01 NOTE — Progress Notes (Signed)
Increase torsemide to 40 mg daily x 3 days, then take 40 mg daily alternating with 20 mg daily.  BMET next week.

## 2017-02-01 NOTE — Progress Notes (Signed)
Pt was adivsed at his OV 02/01/17

## 2017-02-01 NOTE — Progress Notes (Signed)
Advanced Heart Failure Clinic Note   Patient ID: Marcus [name], male   DOB: 09-11-1928, 83 y.o.   MRN: [id number] EP: Dr Marcus [name]  Cardiology: Marcus [name] PCP: none   Marcus [name] is an 83 yo with history of pacemaker for bradycardia, paroxysmal atrial fibrillation, CAD s/p CABG and PCIs, and ischemic cardiomyopathy EF 20-25%. Underwent CRT-D upgrade 10/08/13 due to 85% RV pacing.     Admitted 5/19-5/26/15 with volume overload. He was diuresed with milrinone and IV lasix and discharge weight was 153 lbs. He underwent upgrade of his ICD to CRT-D as well.  He was able to wean off milrinone and did much better after CRT upgrade.  He was admitted in 3/16 with pacemaker pocket infection.  CRT-D device was explanted and temporary-permanent pacemaker with placed.  He later had a St Jude CRT-P system re-implanted.  He was noted to be back in atrial fibrillation and had TEE-guided DCCV to NSR.  TEE showed EF 25-30%.    RHC in 10/17 showed near-normal filling pressures and low but not markedly low cardiac output.  Coreg was cut back to 6.25 mg bid.  Echo in 10/17 showed improvement in LV function, EF 35-40%.   Today he returns for HF follow up with hs wife.  Yesterday ICM device interrogation showed low impedance so  diuretics increased today per Dr Marcus [name]. Overall feeling ok. Complaining of back pain and fatigue. No chest pain. Denies SOB. No bleeding problems. Weight at home 167 pounds. Taking all medications.   Labs (3/16): K 4.1 => 4.6, creatinine 1.87 => 1.97, HCT 34.4, TSH normal, LFTs normal.  Labs (6/16): K 4.2, creatinine 2.34 Labs (10/16): K 4.8, creatinine 2.29 Labs (6/17): K 5.1, creatinine 1.89, AST 30, ALT 25, TSH normal, LDL 159, HCT 37 Labs (10/17): K 5, creatinine 2.45, TSH normal, hgb 12.7, BNP 453 Labs (11/17): LFTs normal Labs (12/17): hgb 11.7, creatinine 2.16 Labs (1/18): K 4.9, creatinine 2.54 Labs (7/18): K 4.3, creatinine 2.87, hgb 12.5 Labs (01/09/2017): K 4.1 Creatinine 2.31    PMH:    1. Symptomatic bradycardia: Medtronic CRT-D.  He developed pocket infection in 3/16 and had device extracted.  He had St Jude CRT-P system replaced.  2. HTN 3. Hyperlipidemia: Has refused statins.  4. Atrial fibrillation: History of prior DCCV.  Paroxysmal.  On Eliquis.  Has history of cardioembolism to right leg with right popliteal and tibial embolectomy (had been off Eliquis). Has had nausea with amiodarone in the past but now tolerating.  Recurrence in 3/16, had TEE-guided DCCV.  5. Prostate cancer 6. CAD: CABG remotely with SVG-OM and LIMA-LAD.  Late 1990s had PCI to RCA.  In 12/09 had LHC with total occlusion of SVG-OM, patent LIMA-LAD, DES to LCx and DES to RCA.   7. Ischemic cardiomyopathy: Echo (3/15) with EF 20-25%, wall motion abnormalities noted, mild to moderately decreased RV systolic function. ICD upgraded to Marcus [name] CRT-D 10/08/13.  TEE (3/16) with EF 25-30%, mildly dilated RV with mildly decreased systolic function, mild to moderate MR.  - RHC (10/17): mean RA 4, PA 44/21 mean 31, unable to wedge, CI 2.0. - Echo (10/17): EF 35-40%, moderate dilation, inferior/inferolateral AK, moderate MR, RV mildly dilated with mildly decreased systolic function.  8. CKD  SH: Married, nonsmoker, lives in [place]  [place]: CAD  ROS: All systems reviewed and negative except as per HPI.   Current Outpatient Prescriptions  Medication Sig Dispense Refill  . amiodarone (PACERONE) 200 MG tablet Take 0.5 tablets (100 mg total) by  mouth daily. 45 tablet 3  . apixaban (ELIQUIS) 2.5 MG TABS tablet Take 1 tablet (2.5 mg total) by mouth 2 (two) times daily. 180 tablet 3  . carvedilol (COREG) 6.25 MG tablet Take 1 tablet (6.25 mg total) by mouth 2 (two) times daily with a meal. 180 tablet 2  . Coenzyme Q10 (CO Q 10) 100 MG CAPS Take 1 capsule by mouth 2 (two) times daily.    . isosorbide mononitrate (IMDUR) 30 MG 24 hr tablet Take 0.5 tablets (15 mg total) by mouth daily. 45 tablet 3  . Multiple  Vitamins-Minerals (MULTIVITAMINS/MINERALS ADULT PO) Take by mouth daily. MacuGuard    . Multiple Vitamins-Minerals (PRESERVISION AREDS 2 PO) Take 1 capsule by mouth 2 (two) times daily.    Marland Kitchen spironolactone (ALDACTONE) 25 MG tablet TAKE ONE-HALF TABLET BY MOUTH ONCE DAILY 45 tablet 3  . torsemide (DEMADEX) 20 MG tablet Take one tab daily alternating with one half tab daily 45 tablet 3  . GLUTATHIONE PO Take by mouth daily. Take as directed     No current facility-administered medications for this encounter.     There were no vitals filed for this visit. Wt Readings from Last 3 Encounters:  01/09/17 171 lb (77.6 kg)  11/27/16 168 lb (76.2 kg)  11/22/16 167 lb 12.8 oz (76.1 kg)    General:  Elderly. Walked in the clinic.  No resp difficulty HEENT: normal Neck: supple. JVP ~10. Carotids 2+ bilat; no bruits. No lymphadenopathy or thryomegaly appreciated. Cor: PMI nondisplaced. Regular rate & rhythm. No rubs, gallops or murmurs. Lungs: clear Abdomen: soft, nontender, nondistended. No hepatosplenomegaly. No bruits or masses. Good bowel sounds. Extremities: no cyanosis, clubbing, rash, R and LLE 1+  edema Neuro: alert & orientedx3, cranial nerves grossly intact. moves all 4 extremities w/o difficulty. Affect pleasant  Assessment/Plan:  1. Chronic systolic CHF: Ischemic cardiomyopathy.  EF 25-30% on 3/16 TEE, improved to 35-40% on most recent echo in 10/17.  Has St Jude CRT-P device.   NYHA II-III. Volume status mildly elevated.  Per Dr Marcus [name]. Torsemide increased to 40 mg daily started on 9/13 for 3 days then he was will alternate with 20 mg daily/ 20mg  daily though I am not sure he will take that way.  - Continue current spironolactone 12.5 daily and Coreg 6.25 mg bid.   - Not on ACEI/ARB with CKD. BMEt next week.  2. Atrial fibrillation: Paroxysmal. He is in NSR on amiodarone 100 mg daily. -Regular pulse.  Needs regular eye exam. - He is on Eliquis 2.5 mg bid (age, elevated creatinine).    3. CAD: No chest pain.  Continue medical management.  - He is not currently on a statin (he has refused to take a statin).   - He is not on ASA given use of Eliquis and stable CAD.  4. Sick sinus syndrome: s/p CRT-P. 5. CKD, stage III: BMET next week. 6. Back Pain:   ? Musculoskeletal.   He does not have PCP and I have offered to refer however he declines.   Follow up next week with Dr Marcus [name].     Amy Clegg NP-C  02/01/2017

## 2017-02-01 NOTE — Progress Notes (Signed)
Marcus [name] 83 y.o. male Pulmonary Rehab Orientation Note Patient arrived today in Cardiac and Pulmonary Rehab for orientation to Pulmonary Rehab. He was transported from [facility] via wheel chair. He has not been prescribed oxygen for home or portable use. Color good, skin warm and dry. Patient is oriented to time and place. Patient's medical history, psychosocial health, and medications reviewed. Psychosocial assessment reveals pt lives with their spouse. Pt is currently retired. Pt hobbies include reading, working in the yard, and learning to speak french. Pt reports his stress level is low. Areas of stress/anxiety include Health.  Pt does not exhibit signs of depression. PHQ2/9 score 0/0. Pt shows good  coping skills with positive outlook. He is offered emotional support and reassurance. Will continue to monitor and evaluate progress toward psychosocial goal(s) of remaining positive about his physical health. Physical assessment reveals heart rate is normal, breath sounds clear to auscultation, no wheezes, rales, or rhonchi. Grip strength equal, strong. Distal pulses palpable. Pitting edema noted to ankles bilat Patient reports he does take medications as prescribed. Patient states he follows a Low Sodium diet. The patient reports no specific efforts to gain or lose weight.. Patient's weight will be monitored closely. Demonstration and practice of PLB using pulse oximeter. Patient able to return demonstration satisfactorily. Safety and hand hygiene in the exercise area reviewed with patient. Patient voices understanding of the information reviewed. Department expectations discussed with patient and achievable goals were set. The patient shows enthusiasm about attending the program and we look forward to working with this nice gentleman. The patient is scheduled for a 6 min walk test on 02/07/17 and to begin exercise on 02/14/17 at 1030.   45 minutes was spent on a variety of activities such as  assessment of the patient, obtaining baseline data including height, weight, BMI, and grip strength, verifying medical history, allergies, and current medications, and teaching patient strategies for performing tasks with less respiratory effort with emphasis on pursed lip breathing.

## 2017-02-04 NOTE — Progress Notes (Signed)
Marcus [name] 83 y.o. male  DOB: 11/06/1928 MRN: [id number]           Nutrition Brief Note 1. Heart failure, chronic systolic (HCC)    Past Medical History:  Diagnosis Date  . Atrial flutter ([place])    a. Dx 07/2014, s/p TEE/DCCV.  . Cataracts, bilateral   . Chronic systolic CHF (congestive heart failure) ([place])    a. s/p CRT-D 09/2013. b. Device explant with temp perm then subsequent CRT-P 07/2014.  Marland Kitchen CKD (chronic kidney disease), stage III   . Coronary artery disease    a. s/p remote CABG x 2(VG->OM, LIMA->LAD;  b. Late 90's s/p PCI of RCA;  c. 12/09 Cath/PCI: LM 23m, 70-80d (3.0x58mm Xience DES), LAD100p, LCX 80-90p (2.25x90mmTaxus Atom DES), RCA 100d (2.5x26mm Xience DES), VG->OM 100, LIMA->LAD nl, EF 30-35%.  Marland Kitchen DVT (deep venous thrombosis) ([place]) 2017   Right knee  . Hard of hearing   . History of colon polyps   . History of gastric ulcer   . History of gout   . History of shingles   . Hyperlipidemia   . Hypertension   . Ischemic cardiomyopathy    a. EF 35-40% by echo 05/2012, b. EF 20-25%, akinesis and scarring of inferolateral, inferior and inferoseptal myocardium, mild AI, mod MR, LA mod dilated, RV mildly dialted and sys fx mildly reduced, RA mildly dilated (09/2013)  c. EF 35-40% by 2D in 07/2014, 25-30% by TEE.  . Macular degeneration, dry   . Pacemaker infection ([place])   . PAF (paroxysmal atrial fibrillation) ([place])   . Peripheral vascular disease ([place])   . Presence of permanent cardiac pacemaker 07/23/2014   [facility] [id number], Serial [id number]  . Prostate cancer ([place])   . Symptomatic bradycardia    a. 08/2003 s/p MDT Enpulse [id number] Dual chamber PPM ser # [id number] H. b. Device explant due to infection/temp perm insertion/PPM 07/2014.   Meds reviewed.   Ht: Ht Readings from Last 1 Encounters:  02/01/17 5\' 7"  (1.702 m)    Wt:  Wt Readings from Last 3 Encounters:  02/01/17 172 lb 6.4 oz (78.2 kg)  02/01/17 172 lb 9.6 oz (78.3 kg)  01/09/17 171 lb (77.6 kg)     BMI: 27.0    Current tobacco use? No  Labs:  No results found for: HGBA1C  Nutrition Diagnosis ? Food-and nutrition-related knowledge deficit related to lack of exposure to information as related to diagnosis of pulmonary disease ? Overweight related to excessive energy intake as evidenced by a BMI of 27  Goal(s) 1. The pt will recognize symptoms that can interfere with adequate oral intake, such as shortness of breath, N/V, early satiety, fatigue, ability to secure and prepare food, taste and smell changes, chewing/swallowing difficulties, and/ or pain when eating. 2. Identify food quantities necessary to achieve wt loss of  -2# per week to a goal wt loss of 6-10 lb at graduation from pulmonary rehab. Plan:  Pt to attend Pulmonary Nutrition class Will provide client-centered nutrition education as part of interdisciplinary care.   Monitor and evaluate progress toward nutrition goal with team.  Monitor and Evaluate progress toward nutrition goal with team.   Derek Mound, M.Ed, RD, LDN, CDE 02/04/2017 12:00 PM

## 2017-02-07 ENCOUNTER — Encounter (HOSPITAL_COMMUNITY)
Admission: RE | Admit: 2017-02-07 | Discharge: 2017-02-07 | Disposition: A | Discharge status: Home / Self Care | Payer: Medicare Other | Source: Ambulatory Visit | Attending: Cardiology | Admitting: Cardiology

## 2017-02-07 DIAGNOSIS — I255 Ischemic cardiomyopathy: Secondary | ICD-10-CM | POA: Diagnosis not present

## 2017-02-07 DIAGNOSIS — I739 Peripheral vascular disease, unspecified: Secondary | ICD-10-CM | POA: Diagnosis not present

## 2017-02-07 DIAGNOSIS — I5022 Chronic systolic (congestive) heart failure: Secondary | ICD-10-CM | POA: Diagnosis not present

## 2017-02-07 DIAGNOSIS — N183 Chronic kidney disease, stage 3 (moderate): Secondary | ICD-10-CM | POA: Diagnosis not present

## 2017-02-07 DIAGNOSIS — E785 Hyperlipidemia, unspecified: Secondary | ICD-10-CM | POA: Diagnosis not present

## 2017-02-07 DIAGNOSIS — I130 Hypertensive heart and chronic kidney disease with heart failure and stage 1 through stage 4 chronic kidney disease, or unspecified chronic kidney disease: Secondary | ICD-10-CM | POA: Diagnosis not present

## 2017-02-08 ENCOUNTER — Ambulatory Visit (INDEPENDENT_AMBULATORY_CARE_PROVIDER_SITE_OTHER): Payer: Self-pay

## 2017-02-08 ENCOUNTER — Encounter (HOSPITAL_COMMUNITY): Payer: Self-pay | Admitting: Cardiology

## 2017-02-08 ENCOUNTER — Ambulatory Visit (HOSPITAL_COMMUNITY)
Admission: RE | Admit: 2017-02-08 | Discharge: 2017-02-08 | Disposition: A | Discharge status: Home / Self Care | Payer: Medicare Other | Source: Ambulatory Visit | Attending: Cardiology | Admitting: Cardiology

## 2017-02-08 VITALS — BP 99/63 | HR 71 | Wt 167.2 lb

## 2017-02-08 DIAGNOSIS — Z951 Presence of aortocoronary bypass graft: Secondary | ICD-10-CM | POA: Diagnosis not present

## 2017-02-08 DIAGNOSIS — I5022 Chronic systolic (congestive) heart failure: Secondary | ICD-10-CM

## 2017-02-08 DIAGNOSIS — I25810 Atherosclerosis of coronary artery bypass graft(s) without angina pectoris: Secondary | ICD-10-CM | POA: Insufficient documentation

## 2017-02-08 DIAGNOSIS — Z8249 Family history of ischemic heart disease and other diseases of the circulatory system: Secondary | ICD-10-CM | POA: Diagnosis not present

## 2017-02-08 DIAGNOSIS — N183 Chronic kidney disease, stage 3 unspecified: Secondary | ICD-10-CM

## 2017-02-08 DIAGNOSIS — Z950 Presence of cardiac pacemaker: Secondary | ICD-10-CM | POA: Diagnosis not present

## 2017-02-08 DIAGNOSIS — I130 Hypertensive heart and chronic kidney disease with heart failure and stage 1 through stage 4 chronic kidney disease, or unspecified chronic kidney disease: Secondary | ICD-10-CM | POA: Diagnosis not present

## 2017-02-08 DIAGNOSIS — I480 Paroxysmal atrial fibrillation: Secondary | ICD-10-CM

## 2017-02-08 DIAGNOSIS — Z7901 Long term (current) use of anticoagulants: Secondary | ICD-10-CM | POA: Insufficient documentation

## 2017-02-08 DIAGNOSIS — Z8546 Personal history of malignant neoplasm of prostate: Secondary | ICD-10-CM | POA: Insufficient documentation

## 2017-02-08 DIAGNOSIS — I255 Ischemic cardiomyopathy: Secondary | ICD-10-CM | POA: Diagnosis not present

## 2017-02-08 DIAGNOSIS — Z79899 Other long term (current) drug therapy: Secondary | ICD-10-CM | POA: Insufficient documentation

## 2017-02-08 DIAGNOSIS — I2510 Atherosclerotic heart disease of native coronary artery without angina pectoris: Secondary | ICD-10-CM | POA: Diagnosis not present

## 2017-02-08 DIAGNOSIS — E785 Hyperlipidemia, unspecified: Secondary | ICD-10-CM | POA: Diagnosis not present

## 2017-02-08 DIAGNOSIS — I495 Sick sinus syndrome: Secondary | ICD-10-CM | POA: Diagnosis not present

## 2017-02-08 LAB — BASIC METABOLIC PANEL
ANION GAP: 8 (ref 5–15)
BUN: 57 mg/dL — ABNORMAL HIGH (ref 6–20)
CO2: 25 mmol/L (ref 22–32)
Calcium: 9.7 mg/dL (ref 8.9–10.3)
Chloride: 107 mmol/L (ref 101–111)
Creatinine, Ser: 2.41 mg/dL — ABNORMAL HIGH (ref 0.61–1.24)
GFR, EST AFRICAN AMERICAN: 27 mL/min — AB (ref >60)
GFR, EST NON AFRICAN AMERICAN: 23 mL/min — AB (ref >60)
Glucose, Bld: 100 mg/dL — ABNORMAL HIGH (ref 65–99)
POTASSIUM: 4.4 mmol/L (ref 3.5–5.1)
SODIUM: 140 mmol/L (ref 135–145)

## 2017-02-08 LAB — CBC
HCT: 39.4 % (ref 39.0–52.0)
Hemoglobin: 12.8 g/dL — ABNORMAL LOW (ref 13.0–17.0)
MCH: 32.2 pg (ref 26.0–34.0)
MCHC: 32.5 g/dL (ref 30.0–36.0)
MCV: 99 fL (ref 78.0–100.0)
PLATELETS: 165 10*3/uL (ref 150–400)
RBC: 3.98 MIL/uL — AB (ref 4.22–5.81)
RDW: 14.9 % (ref 11.5–15.5)
WBC: 4.5 10*3/uL (ref 4.0–10.5)

## 2017-02-08 LAB — BRAIN NATRIURETIC PEPTIDE: B NATRIURETIC PEPTIDE 5: 761.5 pg/mL — AB (ref 0.0–100.0)

## 2017-02-08 MED ORDER — TORSEMIDE 20 MG PO TABS: ORAL_TABLET | ORAL | 6 refills | Status: DC

## 2017-02-08 NOTE — Patient Instructions (Signed)
Start Torsemide 20 mg (1 tab) daily  Labs drawn today (if we do not call you, then your lab work was stable)   Your physician recommends that you schedule a follow-up appointment in: 3 months

## 2017-02-08 NOTE — Progress Notes (Signed)
EPIC Encounter for ICM Monitoring  Patient Name: Marcus [name] is a 83 y.o. male Date: 02/08/2017 Primary Care Physican: Patient, No Pcp Per Primary Cardiologist:[facility] Electrophysiologist: Lovena Le Dry Weight: unknown Bi-V Pacing: >99%       Patient being seen by HF clinic today.  No call to patient   Thoracic impedance abnormal suggesting dryness starting 9/13 which is the day HF clinic advised patient to increase Torsemide to 40 mg daily started x  3 days and then alternate with 65m  Prescribed dosage: Torsemide 40 mg every other day alternating with 20 mg every other day.    Labs: 01/09/2017 Creatinine 2.31, BUN 49, Potassium 4.1, Sodium 141, EGFR 24-28 12/05/2016 Creatinine 2.87, BUN 71, Potassium 4.3, Sodium 139, EGFR 19-22 11/22/2016 Creatinine 2.57, BUN 61, Potassium 4.3, Sodium 139, EGFR 21-25  06/14/2016 Creatinine 2.54. BUN 59, Potassium 4.9, Sodium 137, EGFR 22-25 06/01/2016 Creatinine 2.78, BUN 78, Potassium 4.5, Sodium 136, EGFR 19-22  05/22/2016 Creatinine 2.38, BUN 60, Potassium 4.7, Sodium 136, EGFR 23-27 05/08/2016 Creatinine 2.16, BUN 40, Potassium 4.5, Sodium 143, EGFR 26-30 03/26/2016 Creatinine 1.77, BUN 26, Potassium 4.3, Sodium 140, EGFR 33-39 02/21/2016 Creatinine 2.45, BUN 75, Potassium 5.0, Sodium 138, EGFR 23-26  10/05/2016Creatinine 2.29, BUN 49, Potassium 4.8, Sodium 137, EGFR 25-29  Recommendations: None  Follow-up plan: ICM clinic phone appointment on 03/11/2017.    Copy of ICM check sent to Dr. MAundra Dubinand Dr. TLovena Le   3 month ICM trend: 02/08/2017   1 Year ICM trend:      LRosalene Billings RN 02/08/2017 8:52 AM

## 2017-02-08 NOTE — Progress Notes (Signed)
Advanced Heart Failure Clinic Note   Patient ID: Marcus [name], male   DOB: 1929/01/05, 83 y.o.   MRN: [id number] EP: Dr Lovena Le  Cardiology: [name] is an 83 yo with history of pacemaker for bradycardia, paroxysmal atrial fibrillation, CAD s/p CABG and PCIs, and ischemic cardiomyopathy EF 20-25%. Underwent CRT-D upgrade 10/08/13 due to 85% RV pacing.     Admitted 5/19-5/26/15 with volume overload. He was diuresed with milrinone and IV lasix and discharge weight was 153 lbs. He underwent upgrade of his ICD to CRT-D as well.  He was able to wean off milrinone and did much better after CRT upgrade.  He was admitted in 3/16 with pacemaker pocket infection.  CRT-D device was explanted and temporary-permanent pacemaker with placed.  He later had a [employer] Jude CRT-P system re-implanted.  He was noted to be back in atrial fibrillation and had TEE-guided DCCV to NSR.  TEE showed EF 25-30%.    RHC in 10/17 showed near-normal filling pressures and low but not markedly low cardiac output.  Coreg was cut back to 6.25 mg bid.  Echo in 10/17 showed improvement in LV function, EF 35-40%.   He returns today for followup of CHF.  He saw the NP in our office a week or so ago with volume overload and torsemide was increased to 40 mg daily x 3 days.  He says he is doing ok.  No dyspnea walking on flat ground.  Some shortness of breath walking up stairs.  No lightheadedness.  No chest pain.  No BRBPR/melena.  Weight is down 5 lbs.  He is doing pulmonary rehab now.   Corevue reviewed today: Thoracic impedance back up again, >99% BiV pacing, <1% atrial fibrillation.    Labs (3/16): K 4.1 => 4.6, creatinine 1.87 => 1.97, HCT 34.4, TSH normal, LFTs normal.  Labs (6/16): K 4.2, creatinine 2.34 Labs (10/16): K 4.8, creatinine 2.29 Labs (6/17): K 5.1, creatinine 1.89, AST 30, ALT 25, TSH normal, LDL 159, HCT 37 Labs (10/17): K 5, creatinine 2.45, TSH normal, hgb 12.7, BNP 453 Labs (11/17): LFTs normal Labs (12/17):  hgb 11.7, creatinine 2.16 Labs (1/18): K 4.9, creatinine 2.54 Labs (7/18): K 4.3, creatinine 2.87, hgb 12.5 Labs (8/18): K 4.1, creatinine 2.31, LFTs normal, BNP 816, TSH normal  PMH:  1. Symptomatic bradycardia: [employer] CRT-D.  He developed pocket infection in 3/16 and had device extracted.  He had St Jude CRT-P system replaced.  2. HTN 3. Hyperlipidemia: Has refused statins.  4. Atrial fibrillation: History of prior DCCV.  Paroxysmal.  On Eliquis.  Has history of cardioembolism to right leg with right popliteal and tibial embolectomy (had been off Eliquis). Has had nausea with amiodarone in the past but now tolerating.  Recurrence in 3/16, had TEE-guided DCCV.  5. Prostate cancer 6. CAD: CABG remotely with SVG-OM and LIMA-LAD.  Late 1990s had PCI to RCA.  In 12/09 had LHC with total occlusion of SVG-OM, patent LIMA-LAD, DES to LCx and DES to RCA.   7. Ischemic cardiomyopathy: Echo (3/15) with EF 20-25%, wall motion abnormalities noted, mild to moderately decreased RV systolic function. ICD upgraded to [place] CRT-D 10/08/13.  TEE (3/16) with EF 25-30%, mildly dilated RV with mildly decreased systolic function, mild to moderate MR.  - RHC (10/17): mean RA 4, PA 44/21 mean 31, unable to wedge, CI 2.0. - Echo (10/17): EF 35-40%, moderate dilation, inferior/inferolateral AK, moderate MR, RV mildly dilated with mildly decreased systolic function.  8. CKD  SH: Married, nonsmoker, lives in [place]  FH: CAD  ROS: All systems reviewed and negative except as per HPI.   Current Outpatient Prescriptions  Medication Sig Dispense Refill  . amiodarone (PACERONE) 200 MG tablet Take 0.5 tablets (100 mg total) by mouth daily. 45 tablet 3  . apixaban (ELIQUIS) 2.5 MG TABS tablet Take 1 tablet (2.5 mg total) by mouth 2 (two) times daily. 180 tablet 3  . carvedilol (COREG) 6.25 MG tablet Take 1 tablet (6.25 mg total) by mouth 2 (two) times daily with a meal. 180 tablet 2  . Coenzyme Q10 (CO Q 10) 100 MG  CAPS Take 1 capsule by mouth 2 (two) times daily.    Marland Kitchen GLUTATHIONE PO Take by mouth daily. Take as directed    . isosorbide mononitrate (IMDUR) 30 MG 24 hr tablet Take 0.5 tablets (15 mg total) by mouth daily. 45 tablet 3  . Multiple Vitamins-Minerals (MULTIVITAMINS/MINERALS ADULT PO) Take by mouth daily. MacuGuard    . Multiple Vitamins-Minerals (PRESERVISION AREDS 2 PO) Take 1 capsule by mouth 2 (two) times daily.    Marland Kitchen spironolactone (ALDACTONE) 25 MG tablet TAKE ONE-HALF TABLET BY MOUTH ONCE DAILY 45 tablet 3  . torsemide (DEMADEX) 20 MG tablet Take 40 mg (2 tabs) one day alternating with 20 mg (1 tab) the next day continuously. 45 tablet 6   No current facility-administered medications for this encounter.     Vitals:   02/08/17 0921  BP: 99/63  Pulse: 71  SpO2: 99%  Weight: 167 lb 4 oz (75.9 kg)   Wt Readings from Last 3 Encounters:  02/08/17 167 lb 4 oz (75.9 kg)  02/01/17 172 lb 6.4 oz (78.2 kg)  02/01/17 172 lb 9.6 oz (78.3 kg)    General: NAD Neck: No JVD, no thyromegaly or thyroid nodule.  Lungs: Clear to auscultation bilaterally with normal respiratory effort. CV: Nondisplaced PMI.  Heart regular S1/S2, no S3/S4, no murmur.  No peripheral edema.  No carotid bruit.  Normal pedal pulses.  Abdomen: Soft, nontender, no hepatosplenomegaly, no distention.  Skin: Intact without lesions or rashes.  Neurologic: Alert and oriented x 3.  Psych: Normal affect. Extremities: No clubbing or cyanosis.  HEENT: Normal.   Assessment/Plan:  1. Chronic systolic CHF: Ischemic cardiomyopathy.  EF 25-30% on 3/16 TEE, improved to 35-40% on most recent echo in 10/17.  Has St Jude CRT-P device.  Volume looks better today, Corevue back to baseline.  NYHA class II-III.  He does not tolerate uptitration of meds well.   - Change torsemide to 20 mg daily, was on 20 daily alternating with 10 daily. BMET/BNP today.    - Continue current spironolactone 12.5 daily and Coreg 6.25 mg bid.   - Not on  ACEI/ARB with CKD. 2. Atrial fibrillation: Paroxysmal. He is in NSR on amiodarone 100 mg daily. - Recent LFTs and TSH normal.  Needs regular eye exam. - He is on Eliquis 2.5 mg bid (age, elevated creatinine).  3. CAD: Stable. No chest pain. Continue medical management.  - He is not currently on a statin (he has refused to take a statin).   - He is not on ASA given use of Eliquis and stable CAD.  4. Sick sinus syndrome: s/p CRT-P. 5. CKD, stage III: BMET today.     Followup in 3 months.    [name] 02/08/2017

## 2017-02-12 ENCOUNTER — Encounter (HOSPITAL_COMMUNITY)
Admission: RE | Admit: 2017-02-12 | Discharge: 2017-02-12 | Disposition: A | Discharge status: Home / Self Care | Payer: Medicare Other | Source: Ambulatory Visit | Attending: Cardiology | Admitting: Cardiology

## 2017-02-12 VITALS — Wt 168.4 lb

## 2017-02-12 DIAGNOSIS — I255 Ischemic cardiomyopathy: Secondary | ICD-10-CM | POA: Diagnosis not present

## 2017-02-12 DIAGNOSIS — I739 Peripheral vascular disease, unspecified: Secondary | ICD-10-CM | POA: Diagnosis not present

## 2017-02-12 DIAGNOSIS — I130 Hypertensive heart and chronic kidney disease with heart failure and stage 1 through stage 4 chronic kidney disease, or unspecified chronic kidney disease: Secondary | ICD-10-CM | POA: Diagnosis not present

## 2017-02-12 DIAGNOSIS — E785 Hyperlipidemia, unspecified: Secondary | ICD-10-CM | POA: Diagnosis not present

## 2017-02-12 DIAGNOSIS — N183 Chronic kidney disease, stage 3 (moderate): Secondary | ICD-10-CM | POA: Diagnosis not present

## 2017-02-12 DIAGNOSIS — I5022 Chronic systolic (congestive) heart failure: Secondary | ICD-10-CM | POA: Diagnosis not present

## 2017-02-12 NOTE — Progress Notes (Signed)
Daily Session Note  Patient Details  Name: Marcus [name] MRN: [id number] Date of Birth: 05-30-1928 Referring Provider:     Pulmonary Rehab Walk Test from 02/07/2017 in [facility]  Referring Provider  Aundra Dubin      Encounter Date: 02/12/2017  Check In:     Session Check In - 02/12/17 1030      Check-In   Location [facility]   Staff Present Trish Fountain, RN, BSN;Parish Dubose, MS, ACSM RCEP, Exercise Physiologist;Lisa Ysidro Evert, RN   Supervising physician immediately available to respond to emergencies [facility] immediately available   Physician(s) Dr. Wynelle Cleveland   Medication changes reported     No   Fall or balance concerns reported    No   Tobacco Cessation No Change   Warm-up and Cool-down Performed as group-led instruction   Resistance Training Performed Yes   VAD Patient? No     Pain Assessment   Currently in Pain? No/denies   Multiple Pain Sites No      Capillary Blood Glucose: No results found for this or any previous visit (from the past 24 hour(s)).      Exercise Prescription Changes - 02/12/17 1200      Response to Exercise   Blood Pressure (Admit) 100/62   Blood Pressure (Exercise) 138/74   Blood Pressure (Exit) 102/64   Heart Rate (Admit) 86 bpm   Heart Rate (Exercise) 102 bpm   Heart Rate (Exit) 80 bpm   Oxygen Saturation (Admit) 100 %   Oxygen Saturation (Exercise) 98 %   Oxygen Saturation (Exit) 100 %   Rating of Perceived Exertion (Exercise) 11   Perceived Dyspnea (Exercise) 1   Duration Continue with 45 min of aerobic exercise without signs/symptoms of physical distress.   Intensity THRR unchanged     Resistance Training   Training Prescription Yes   Weight blue bands   Reps 10-15     Bike   Level 2  scitfit   Minutes 17     NuStep   Level 2   Minutes 17   METs 2     Track   Laps 11   Minutes 17      History  Smoking Status  . Former Smoker  . Quit date: 02/10/1971   Smokeless Tobacco  . Never Used    Comment: quit smoking 1yr ago    Goals Met:  Exercise tolerated well No report of cardiac concerns or symptoms Strength training completed today  Goals Unmet:  Not Applicable  Comments: Service time is from 10:30a to 12:00p    Dr. WRush Farmeris Medical Director for Pul[facility] Re[facility] at [facility]

## 2017-02-12 NOTE — Progress Notes (Signed)
Pulmonary Individual Treatment Plan  Patient Details  Name: Marcus [name] MRN: [id number] Date of Birth: February 16, 1929 Referring Provider:     Pulmonary Rehab Walk Test from 02/07/2017 in [place]  Referring Provider  Aundra Dubin      Initial Encounter Date:    Pulmonary Rehab Walk Test from 02/07/2017 in [place]  Date  02/12/17  Referring Provider  Aundra Dubin      Visit Diagnosis: Heart failure, chronic systolic ([place])  Patient's Home Medications on Admission:   Current Outpatient Prescriptions:  .  amiodarone (PACERONE) 200 MG tablet, Take 0.5 tablets (100 mg total) by mouth daily., Disp: 45 tablet, Rfl: 3 .  apixaban (ELIQUIS) 2.5 MG TABS tablet, Take 1 tablet (2.5 mg total) by mouth 2 (two) times daily., Disp: 180 tablet, Rfl: 3 .  carvedilol (COREG) 6.25 MG tablet, Take 1 tablet (6.25 mg total) by mouth 2 (two) times daily with a meal., Disp: 180 tablet, Rfl: 2 .  Coenzyme Q10 (CO Q 10) 100 MG CAPS, Take 1 capsule by mouth 2 (two) times daily., Disp: , Rfl:  .  GLUTATHIONE PO, Take by mouth daily. Take as directed, Disp: , Rfl:  .  isosorbide mononitrate (IMDUR) 30 MG 24 hr tablet, Take 0.5 tablets (15 mg total) by mouth daily., Disp: 45 tablet, Rfl: 3 .  Multiple Vitamins-Minerals (MULTIVITAMINS/MINERALS ADULT PO), Take by mouth daily. MacuGuard, Disp: , Rfl:  .  Multiple Vitamins-Minerals (PRESERVISION AREDS 2 PO), Take 1 capsule by mouth 2 (two) times daily., Disp: , Rfl:  .  spironolactone (ALDACTONE) 25 MG tablet, TAKE ONE-HALF TABLET BY MOUTH ONCE DAILY, Disp: 45 tablet, Rfl: 3 .  torsemide (DEMADEX) 20 MG tablet, Take 40 mg (2 tabs) one day alternating with 20 mg (1 tab) the next day continuously., Disp: 45 tablet, Rfl: 6  Past Medical History: Past Medical History:  Diagnosis Date  . Atrial flutter ([place])    a. Dx 07/2014, s/p TEE/DCCV.  . Cataracts, bilateral   . Chronic systolic CHF (congestive heart  failure) ([place])    a. s/p CRT-D 09/2013. b. Device explant with temp perm then subsequent CRT-P 07/2014.  Marland Kitchen CKD (chronic kidney disease), stage III   . Coronary artery disease    a. s/p remote CABG x 2(VG->OM, LIMA->LAD;  b. Late 90's s/p PCI of RCA;  c. 12/09 Cath/PCI: LM 40m, 70-80d (3.0x71mm Xience DES), LAD100p, LCX 80-90p (2.25x8mmTaxus Atom DES), RCA 100d (2.5x18mm Xience DES), VG->OM 100, LIMA->LAD nl, EF 30-35%.  Marland Kitchen DVT (deep venous thrombosis) ([place]) 2017   Right knee  . Hard of hearing   . History of colon polyps   . History of gastric ulcer   . History of gout   . History of shingles   . Hyperlipidemia   . Hypertension   . Ischemic cardiomyopathy    a. EF 35-40% by echo 05/2012, b. EF 20-25%, akinesis and scarring of inferolateral, inferior and inferoseptal myocardium, mild AI, mod MR, LA mod dilated, RV mildly dialted and sys fx mildly reduced, RA mildly dilated (09/2013)  c. EF 35-40% by 2D in 07/2014, 25-30% by TEE.  . Macular degeneration, dry   . Pacemaker infection ([place])   . PAF (paroxysmal atrial fibrillation) ([place])   . Peripheral vascular disease ([place])   . Presence of permanent cardiac pacemaker 07/23/2014   [facility] [id number], Serial [id number]  . Prostate cancer ([place])   . Symptomatic bradycardia    a. 08/2003 s/p  MDT Enpulse [id number] Dual chamber PPM ser # [id number] H. b. Device explant due to infection/temp perm insertion/PPM 07/2014.    Tobacco Use: History  Smoking Status  . Former Smoker  . Quit date: 02/10/1971  Smokeless Tobacco  . Never Used    Comment: quit smoking 19yrs ago    Labs: Recent Review Flowsheet Data    Labs for ITP Cardiac and Pulmonary Rehab Latest Ref Rng & Units 07/20/2014 07/21/2014 07/22/2014 03/01/2016 03/01/2016   Cholestrol 0 - 200 mg/dL - - - - -   LDLCALC 0 - 99 mg/dL - - - - -   HDL >39.00 mg/dL - - - - -   Trlycerides 0.0 - 149.0 mg/dL - - - - -   HCO3 20.0 - 28.0 mmol/L - - - 19.1(L) 18.8(L)   TCO2 0 - 100 mmol/L - - - 20 20    ACIDBASEDEF 0.0 - 2.0 mmol/L - - - 8.0(H) 8.0(H)   O2SAT % 68.7 82.3 68.4 58.0 61.0      Capillary Blood Glucose: Lab Results  Component Value Date   GLUCAP 107 (H) 07/24/2014     Pulmonary Assessment Scores:   Pulmonary Function Assessment:     Pulmonary Function Assessment - 02/01/17 1312      Breath   Bilateral Breath Sounds Clear   Shortness of Breath Yes;Limiting activity      Exercise Target Goals: Date: 02/12/17  Exercise Program Goal: Individual exercise prescription set with THRR, safety & activity barriers. Participant demonstrates ability to understand and report RPE using BORG scale, to self-measure pulse accurately, and to acknowledge the importance of the exercise prescription.  Exercise Prescription Goal: Starting with aerobic activity 30 plus minutes a day, 3 days per week for initial exercise prescription. Provide home exercise prescription and guidelines that participant acknowledges understanding prior to discharge.  Activity Barriers & Risk Stratification:     Activity Barriers & Cardiac Risk Stratification - 02/01/17 1255      Activity Barriers & Cardiac Risk Stratification   Activity Barriers Decreased Ventricular Function;Balance Concerns      6 Minute Walk:     6 Minute Walk    Row Name 02/12/17 0719         6 Minute Walk   Phase Initial     Distance 1220 feet     Walk Time 6 minutes     # of Rest Breaks 0     MPH 2.31     METS 2.76     RPE 12     Perceived Dyspnea  1     Symptoms No     Resting HR 74 bpm     Resting BP 126/76     Resting Oxygen Saturation  95 %     Exercise Oxygen Saturation  during 6 min walk 100 %     Max Ex. HR 97 bpm     Max Ex. BP 133/77       Interval HR   1 Minute HR 78     2 Minute HR 91     3 Minute HR 75     4 Minute HR 96     5 Minute HR 97     6 Minute HR 95     2 Minute Post HR 81     Interval Heart Rate? Yes       Interval Oxygen   Interval Oxygen? Yes     Baseline Oxygen  Saturation % 95 %  1 Minute Oxygen Saturation % 100 %     1 Minute Liters of Oxygen 0 L     2 Minute Oxygen Saturation % 100 %     2 Minute Liters of Oxygen 0 L     3 Minute Oxygen Saturation % 100 %     3 Minute Liters of Oxygen 0 L     4 Minute Oxygen Saturation % 100 %     4 Minute Liters of Oxygen 0 L     5 Minute Oxygen Saturation % 100 %     5 Minute Liters of Oxygen 0 L     6 Minute Oxygen Saturation % 100 %     6 Minute Liters of Oxygen 0 L     2 Minute Post Oxygen Saturation % 100 %     2 Minute Post Liters of Oxygen 0 L        Oxygen Initial Assessment:     Oxygen Initial Assessment - 02/01/17 1253      Home Oxygen   Home Oxygen Device None   Sleep Oxygen Prescription None   Home Exercise Oxygen Prescription None   Home at Rest Exercise Oxygen Prescription None      Oxygen Re-Evaluation:   Oxygen Discharge (Final Oxygen Re-Evaluation):   Initial Exercise Prescription:     Initial Exercise Prescription - 02/12/17 0700      Date of Initial Exercise RX and Referring Provider   Date 02/12/17   Referring Provider Barbette Merino   Level 2  scitfit   Minutes 17     NuStep   Level 2   Minutes 17   METs 1.5     Track   Laps 10   Minutes 17     Prescription Details   Frequency (times per week) 2   Duration Progress to 45 minutes of aerobic exercise without signs/symptoms of physical distress     Intensity   THRR 40-80% of Max Heartrate 54-108   Ratings of Perceived Exertion 11-13   Perceived Dyspnea 0-4     Progression   Progression Continue progressive overload as per policy without signs/symptoms or physical distress.     Resistance Training   Training Prescription Yes   Weight blue bands   Reps 10-15      Perform Capillary Blood Glucose checks as needed.  Exercise Prescription Changes:   Exercise Comments:   Exercise Goals and Review:     Exercise Goals    Row Name 02/01/17 1256             Exercise Goals    Increase Physical Activity Yes       Intervention Provide advice, education, support and counseling about physical activity/exercise needs.;Develop an individualized exercise prescription for aerobic and resistive training based on initial evaluation findings, risk stratification, comorbidities and participant's personal goals.       Expected Outcomes Achievement of increased cardiorespiratory fitness and enhanced flexibility, muscular endurance and strength shown through measurements of functional capacity and personal statement of participant.       Increase Strength and Stamina Yes       Intervention Provide advice, education, support and counseling about physical activity/exercise needs.;Develop an individualized exercise prescription for aerobic and resistive training based on initial evaluation findings, risk stratification, comorbidities and participant's personal goals.       Expected Outcomes Achievement of increased cardiorespiratory fitness and enhanced flexibility, muscular endurance and strength shown through measurements of functional capacity and  personal statement of participant.       Able to understand and use rate of perceived exertion (RPE) scale Yes       Intervention Provide education and explanation on how to use RPE scale       Expected Outcomes Short Term: Able to use RPE daily in rehab to express subjective intensity level;Long Term:  Able to use RPE to guide intensity level when exercising independently       Able to understand and use Dyspnea scale Yes       Intervention Provide education and explanation on how to use Dyspnea scale       Expected Outcomes Short Term: Able to use Dyspnea scale daily in rehab to express subjective sense of shortness of breath during exertion;Long Term: Able to use Dyspnea scale to guide intensity level when exercising independently       Knowledge and understanding of Target Heart Rate Range (THRR) Yes       Intervention Provide education and  explanation of THRR including how the numbers were predicted and where they are located for reference       Expected Outcomes Short Term: Able to state/look up THRR;Long Term: Able to use THRR to govern intensity when exercising independently;Short Term: Able to use daily as guideline for intensity in rehab       Understanding of Exercise Prescription Yes       Intervention Provide education, explanation, and written materials on patient's individual exercise prescription       Expected Outcomes Short Term: Able to explain program exercise prescription;Long Term: Able to explain home exercise prescription to exercise independently          Exercise Goals Re-Evaluation :   Discharge Exercise Prescription (Final Exercise Prescription Changes):   Nutrition:  Target Goals: Understanding of nutrition guidelines, daily intake of sodium 1500mg , cholesterol 200mg , calories 30% from fat and 7% or less from saturated fats, daily to have 5 or more servings of fruits and vegetables.  Biometrics:     Pre Biometrics - 02/01/17 1257      Pre Biometrics   Grip Strength 36 kg       Nutrition Therapy Plan and Nutrition Goals:     Nutrition Therapy & Goals - 02/04/17 1206      Nutrition Therapy   Diet Low Sodium     Personal Nutrition Goals   Nutrition Goal The pt will recognize symptoms that can interfere with adequate oral intake, such as shortness of breath, N/V, early satiety, fatigue, ability to secure and prepare food, taste and smell changes, chewing/swallowing difficulties, and/ or pain when eating.   Personal Goal #2 Identify food quantities necessary to achieve wt loss of  -2# per week to a goal wt loss of 6-10 lb at graduation from pulmonary rehab.     Intervention Plan   Intervention Prescribe, educate and counsel regarding individualized specific dietary modifications aiming towards targeted core components such as weight, hypertension, lipid management, diabetes, heart failure  and other comorbidities.   Expected Outcomes Short Term Goal: Understand basic principles of dietary content, such as calories, fat, sodium, cholesterol and nutrients.;Long Term Goal: Adherence to prescribed nutrition plan.      Nutrition Discharge: Rate Your Plate Scores:     Nutrition Assessments - 02/04/17 1206      Rate Your Plate Scores   Pre Score 49      Nutrition Goals Re-Evaluation:   Nutrition Goals Discharge (Final Nutrition Goals Re-Evaluation):   Psychosocial: Target  Goals: Acknowledge presence or absence of significant depression and/or stress, maximize coping skills, provide positive support system. Participant is able to verbalize types and ability to use techniques and skills needed for reducing stress and depression.  Initial Review & Psychosocial Screening:     Initial Psych Review & Screening - 02/01/17 1313      Initial Review   Current issues with None Identified     Family Dynamics   Good Support System? Yes     Barriers   Psychosocial barriers to participate in program There are no identifiable barriers or psychosocial needs.     Screening Interventions   Interventions Encouraged to exercise      Quality of Life Scores:   PHQ-9: Recent Review Flowsheet Data    Depression screen [facility] 2/9 02/01/2017   Decreased Interest 0   Down, Depressed, Hopeless 0   PHQ - 2 Score 0     Interpretation of Total Score  Total Score Depression Severity:  1-4 = Minimal depression, 5-9 = Mild depression, 10-14 = Moderate depression, 15-19 = Moderately severe depression, 20-27 = Severe depression   Psychosocial Evaluation and Intervention:     Psychosocial Evaluation - 02/01/17 1314      Psychosocial Evaluation & Interventions   Interventions Encouraged to exercise with the program and follow exercise prescription   Expected Outcomes patient will remain free from psychosocial barriers to participation in pulmonary rehab.   Continue Psychosocial  Services  No Follow up required      Psychosocial Re-Evaluation:   Psychosocial Discharge (Final Psychosocial Re-Evaluation):   Education: Education Goals: Education classes will be provided on a weekly basis, covering required topics. Participant will state understanding/return demonstration of topics presented.  Learning Barriers/Preferences:     Learning Barriers/Preferences - 02/01/17 1311      Learning Barriers/Preferences   Learning Barriers None   Learning Preferences Written Material;Group Instruction;Verbal Instruction;Skilled Demonstration      Education Topics: Risk Factor Reduction:  -Group instruction that is supported by a PowerPoint presentation. Instructor discusses the definition of a risk factor, different risk factors for pulmonary disease, and how the heart and lungs work together.     Nutrition for Pulmonary Patient:  -Group instruction provided by PowerPoint slides, verbal discussion, and written materials to support subject matter. The instructor gives an explanation and review of healthy diet recommendations, which includes a discussion on weight management, recommendations for fruit and vegetable consumption, as well as protein, fluid, caffeine, fiber, sodium, sugar, and alcohol. Tips for eating when patients are short of breath are discussed.   Pursed Lip Breathing:  -Group instruction that is supported by demonstration and informational handouts. Instructor discusses the benefits of pursed lip and diaphragmatic breathing and detailed demonstration on how to preform both.     Oxygen Safety:  -Group instruction provided by PowerPoint, verbal discussion, and written material to support subject matter. There is an overview of "What is Oxygen" and "Why do we need it".  Instructor also reviews how to create a safe environment for oxygen use, the importance of using oxygen as prescribed, and the risks of noncompliance. There is a brief discussion on traveling  with oxygen and resources the patient may utilize.   Oxygen Equipment:  -Group instruction provided by [facility] Staff utilizing handouts, written materials, and equipment demonstrations.   Signs and Symptoms:  -Group instruction provided by written material and verbal discussion to support subject matter. Warning signs and symptoms of infection, stroke, and heart attack are reviewed and when  to call the physician/911 reinforced. Tips for preventing the spread of infection discussed.   Advanced Directives:  -Group instruction provided by verbal instruction and written material to support subject matter. Instructor reviews Advanced Directive laws and proper instruction for filling out document.   Pulmonary Video:  -Group video education that reviews the importance of medication and oxygen compliance, exercise, good nutrition, pulmonary hygiene, and pursed lip and diaphragmatic breathing for the pulmonary patient.   Exercise for the Pulmonary Patient:  -Group instruction that is supported by a PowerPoint presentation. Instructor discusses benefits of exercise, core components of exercise, frequency, duration, and intensity of an exercise routine, importance of utilizing pulse oximetry during exercise, safety while exercising, and options of places to exercise outside of rehab.     Pulmonary Medications:  -Verbally interactive group education provided by instructor with focus on inhaled medications and proper administration.   Anatomy and Physiology of the Respiratory System and Intimacy:  -Group instruction provided by PowerPoint, verbal discussion, and written material to support subject matter. Instructor reviews respiratory cycle and anatomical components of the respiratory system and their functions. Instructor also reviews differences in obstructive and restrictive respiratory diseases with examples of each. Intimacy, Sex, and Sexuality differences are reviewed with a discussion on  how relationships can change when diagnosed with pulmonary disease. Common sexual concerns are reviewed.   MD DAY -A group question and answer session with a medical doctor that allows participants to ask questions that relate to their pulmonary disease state.   OTHER EDUCATION -Group or individual verbal, written, or video instructions that support the educational goals of the pulmonary rehab program.   Knowledge Questionnaire Score:   Core Components/Risk Factors/Patient Goals at Admission:     Personal Goals and Risk Factors at Admission - 02/01/17 1312      Core Components/Risk Factors/Patient Goals on Admission   Improve shortness of breath with ADL's Yes   Intervention Provide education, individualized exercise plan and daily activity instruction to help decrease symptoms of SOB with activities of daily living.   Expected Outcomes Short Term: Achieves a reduction of symptoms when performing activities of daily living.   Develop more efficient breathing techniques such as purse lipped breathing and diaphragmatic breathing; and practicing self-pacing with activity Yes   Intervention Provide education, demonstration and support about specific breathing techniuqes utilized for more efficient breathing. Include techniques such as pursed lipped breathing, diaphragmatic breathing and self-pacing activity.   Expected Outcomes Short Term: Participant will be able to demonstrate and use breathing techniques as needed throughout daily activities.   Heart Failure Yes   Intervention Provide a combined exercise and nutrition program that is supplemented with education, support and counseling about heart failure. Directed toward relieving symptoms such as shortness of breath, decreased exercise tolerance, and extremity edema.   Expected Outcomes Improve functional capacity of life;Short term: Attendance in program 2-3 days a week with increased exercise capacity. Reported lower sodium intake.  Reported increased fruit and vegetable intake. Reports medication compliance.;Short term: Daily weights obtained and reported for increase. Utilizing diuretic protocols set by physician.;Long term: Adoption of self-care skills and reduction of barriers for early signs and symptoms recognition and intervention leading to self-care maintenance.      Core Components/Risk Factors/Patient Goals Review:    Core Components/Risk Factors/Patient Goals at Discharge (Final Review):    ITP Comments:   Comments:

## 2017-02-14 ENCOUNTER — Encounter (HOSPITAL_COMMUNITY)
Admission: RE | Admit: 2017-02-14 | Discharge: 2017-02-14 | Disposition: A | Discharge status: Home / Self Care | Payer: Medicare Other | Source: Ambulatory Visit | Attending: Internal Medicine | Admitting: Internal Medicine

## 2017-02-14 VITALS — Wt 170.4 lb

## 2017-02-14 DIAGNOSIS — I255 Ischemic cardiomyopathy: Secondary | ICD-10-CM | POA: Diagnosis not present

## 2017-02-14 DIAGNOSIS — I739 Peripheral vascular disease, unspecified: Secondary | ICD-10-CM | POA: Diagnosis not present

## 2017-02-14 DIAGNOSIS — I130 Hypertensive heart and chronic kidney disease with heart failure and stage 1 through stage 4 chronic kidney disease, or unspecified chronic kidney disease: Secondary | ICD-10-CM | POA: Diagnosis not present

## 2017-02-14 DIAGNOSIS — I5022 Chronic systolic (congestive) heart failure: Secondary | ICD-10-CM

## 2017-02-14 DIAGNOSIS — E785 Hyperlipidemia, unspecified: Secondary | ICD-10-CM | POA: Diagnosis not present

## 2017-02-14 DIAGNOSIS — N183 Chronic kidney disease, stage 3 (moderate): Secondary | ICD-10-CM | POA: Diagnosis not present

## 2017-02-14 NOTE — Progress Notes (Signed)
Daily Session Note  Patient Details  Name: Marcus [name] MRN: [id number] Date of Birth: Dec 02, 1928 Referring Provider:     Pulmonary Rehab Walk Test from 02/07/2017 in [place]  Referring Provider  Aundra Dubin      Encounter Date: 02/14/2017  Check In:     Session Check In - 02/14/17 1058      Check-In   Location [facility]   Staff Present Su Hilt, MS, ACSM RCEP, Exercise Physiologist;Lisa Ysidro Evert, RN;Portia Rollene Rotunda, RN, BSN   Supervising physician immediately available to respond to emergencies [facility] immediately available   Physician(s) Dr. Wendee Beavers   Medication changes reported     No   Fall or balance concerns reported    No   Tobacco Cessation No Change   Warm-up and Cool-down Performed as group-led instruction   Resistance Training Performed Yes   VAD Patient? No     Pain Assessment   Currently in Pain? No/denies   Multiple Pain Sites No      Capillary Blood Glucose: No results found for this or any previous visit (from the past 24 hour(s)).      Exercise Prescription Changes - 02/14/17 1200      Response to Exercise   Blood Pressure (Admit) 108/66   Blood Pressure (Exercise) 128/66   Blood Pressure (Exit) 104/72   Heart Rate (Admit) 95 bpm   Heart Rate (Exercise) 87 bpm   Heart Rate (Exit) 70 bpm   Oxygen Saturation (Admit) 100 %   Oxygen Saturation (Exercise) 100 %   Oxygen Saturation (Exit) 99 %   Rating of Perceived Exertion (Exercise) 12   Perceived Dyspnea (Exercise) 1   Duration Continue with 45 min of aerobic exercise without signs/symptoms of physical distress.   Intensity THRR unchanged     Resistance Training   Training Prescription Yes   Weight blue bands   Reps 10-15     NuStep   Level 2   Minutes 17   METs 2.2     Track   Laps 10   Minutes 17      History  Smoking Status  . Former Smoker  . Quit date: 02/10/1971  Smokeless Tobacco  . Never Used    Comment:  quit smoking 54yr ago    Goals Met:  Exercise tolerated well No report of cardiac concerns or symptoms Strength training completed today  Goals Unmet:  Not Applicable  Comments: Service time is from 10:30A to 12:10P    Dr. WRush Farmeris Medical Director [facility]

## 2017-02-19 ENCOUNTER — Encounter (HOSPITAL_COMMUNITY)
Admission: RE | Admit: 2017-02-19 | Discharge: 2017-02-19 | Disposition: A | Discharge status: Home / Self Care | Payer: Medicare Other | Source: Ambulatory Visit | Attending: Cardiology | Admitting: Cardiology

## 2017-02-19 VITALS — Wt 168.9 lb

## 2017-02-19 DIAGNOSIS — I739 Peripheral vascular disease, unspecified: Secondary | ICD-10-CM | POA: Diagnosis not present

## 2017-02-19 DIAGNOSIS — Z951 Presence of aortocoronary bypass graft: Secondary | ICD-10-CM | POA: Insufficient documentation

## 2017-02-19 DIAGNOSIS — I4892 Unspecified atrial flutter: Secondary | ICD-10-CM | POA: Diagnosis not present

## 2017-02-19 DIAGNOSIS — Z7901 Long term (current) use of anticoagulants: Secondary | ICD-10-CM | POA: Insufficient documentation

## 2017-02-19 DIAGNOSIS — N183 Chronic kidney disease, stage 3 (moderate): Secondary | ICD-10-CM | POA: Insufficient documentation

## 2017-02-19 DIAGNOSIS — Z950 Presence of cardiac pacemaker: Secondary | ICD-10-CM | POA: Diagnosis not present

## 2017-02-19 DIAGNOSIS — Z87891 Personal history of nicotine dependence: Secondary | ICD-10-CM | POA: Insufficient documentation

## 2017-02-19 DIAGNOSIS — E785 Hyperlipidemia, unspecified: Secondary | ICD-10-CM | POA: Diagnosis not present

## 2017-02-19 DIAGNOSIS — I255 Ischemic cardiomyopathy: Secondary | ICD-10-CM | POA: Insufficient documentation

## 2017-02-19 DIAGNOSIS — I130 Hypertensive heart and chronic kidney disease with heart failure and stage 1 through stage 4 chronic kidney disease, or unspecified chronic kidney disease: Secondary | ICD-10-CM | POA: Diagnosis not present

## 2017-02-19 DIAGNOSIS — I2510 Atherosclerotic heart disease of native coronary artery without angina pectoris: Secondary | ICD-10-CM | POA: Diagnosis not present

## 2017-02-19 DIAGNOSIS — I5022 Chronic systolic (congestive) heart failure: Secondary | ICD-10-CM | POA: Diagnosis not present

## 2017-02-19 DIAGNOSIS — Z79899 Other long term (current) drug therapy: Secondary | ICD-10-CM | POA: Diagnosis not present

## 2017-02-19 NOTE — Progress Notes (Signed)
Daily Session Note  Patient Details  Name: KEYONTAE HUCKEBY MRN: [id number] Date of Birth: January 02, 1929 Referring Provider:     Pulmonary Rehab Walk Test from 02/07/2017 in [place]  Referring Provider  Aundra Dubin      Encounter Date: 02/19/2017  Check In:     Session Check In - 02/19/17 1230      Check-In   Location [facility]   Staff Present Su Hilt, MS, ACSM RCEP, Exercise Physiologist;Lisa Ysidro Evert, RN;Other   Supervising physician immediately available to respond to emergencies [facility] immediately available   Physician(s) Dr. Wendee Beavers   Medication changes reported     No   Tobacco Cessation No Change   Warm-up and Cool-down Performed as group-led instruction   Resistance Training Performed Yes   VAD Patient? No     Pain Assessment   Currently in Pain? No/denies   Multiple Pain Sites No      Capillary Blood Glucose: No results found for this or any previous visit (from the past 24 hour(s)).      Exercise Prescription Changes - 02/19/17 1200      Response to Exercise   Blood Pressure (Admit) 104/60   Blood Pressure (Exercise) 122/60   Blood Pressure (Exit) 124/76   Heart Rate (Admit) 88 bpm   Heart Rate (Exercise) 94 bpm   Heart Rate (Exit) 74 bpm   Oxygen Saturation (Admit) 100 %   Oxygen Saturation (Exercise) 100 %   Oxygen Saturation (Exit) 100 %   Rating of Perceived Exertion (Exercise) 13   Perceived Dyspnea (Exercise) 2   Duration Continue with 45 min of aerobic exercise without signs/symptoms of physical distress.   Intensity THRR unchanged     Resistance Training   Training Prescription Yes   Weight blue bands   Reps 10-15     Bike   Level 2   Minutes 17     NuStep   Level 2   Minutes 17   METs 2.2     Track   Laps 10   Minutes 17      History  Smoking Status  . Former Smoker  . Quit date: 02/10/1971  Smokeless Tobacco  . Never Used    Comment: quit smoking 55yr ago     Goals Met:  Exercise tolerated well No report of cardiac concerns or symptoms Strength training completed today  Goals Unmet:  Not Applicable  Comments: Service time is from 10:30a to 12:10p    Dr. WRush Farmeris Medical Director for Pulmonary Rehab at [facility]

## 2017-02-21 ENCOUNTER — Encounter (HOSPITAL_COMMUNITY)
Admission: RE | Admit: 2017-02-21 | Discharge: 2017-02-21 | Disposition: A | Discharge status: Home / Self Care | Payer: Medicare Other | Source: Ambulatory Visit | Attending: Cardiology | Admitting: Cardiology

## 2017-02-21 VITALS — Wt 170.9 lb

## 2017-02-21 DIAGNOSIS — I5022 Chronic systolic (congestive) heart failure: Secondary | ICD-10-CM | POA: Diagnosis not present

## 2017-02-21 DIAGNOSIS — N183 Chronic kidney disease, stage 3 (moderate): Secondary | ICD-10-CM | POA: Diagnosis not present

## 2017-02-21 DIAGNOSIS — E785 Hyperlipidemia, unspecified: Secondary | ICD-10-CM | POA: Diagnosis not present

## 2017-02-21 DIAGNOSIS — I130 Hypertensive heart and chronic kidney disease with heart failure and stage 1 through stage 4 chronic kidney disease, or unspecified chronic kidney disease: Secondary | ICD-10-CM | POA: Diagnosis not present

## 2017-02-21 DIAGNOSIS — I739 Peripheral vascular disease, unspecified: Secondary | ICD-10-CM | POA: Diagnosis not present

## 2017-02-21 DIAGNOSIS — I255 Ischemic cardiomyopathy: Secondary | ICD-10-CM | POA: Diagnosis not present

## 2017-02-21 NOTE — Progress Notes (Signed)
Daily Session Note  Patient Details  Name: Marcus [name] MRN: [id number] Date of Birth: Apr 10, 1929 Referring Provider:     Pulmonary Rehab Walk Test from 02/07/2017 in [place]  Referring Provider  Aundra Dubin      Encounter Date: 02/21/2017  Check In:     Session Check In - 02/21/17 1104      Check-In   [facility]   Staff Present Su Hilt, MS, ACSM RCEP, Exercise Physiologist;Lisa Ysidro Evert, RN   Supervising physician immediately available to respond to emergencies [facility] immediately available   Physician(s) Dr. Zigmund Daniel   Medication changes reported     No   Tobacco Cessation No Change   Warm-up and Cool-down Performed as group-led instruction   Resistance Training Performed Yes   VAD Patient? No     Pain Assessment   Currently in Pain? No/denies   Multiple Pain Sites No      Capillary Blood Glucose: No results found for this or any previous visit (from the past 24 hour(s)).      Exercise Prescription Changes - 02/21/17 1200      Response to Exercise   Blood Pressure (Admit) 118/68   Blood Pressure (Exercise) 120/76   Blood Pressure (Exit) 120/60   Heart Rate (Admit) 81 bpm   Heart Rate (Exercise) 94 bpm   Heart Rate (Exit) 69 bpm   Oxygen Saturation (Admit) 100 %   Oxygen Saturation (Exercise) 97 %   Oxygen Saturation (Exit) 97 %   Rating of Perceived Exertion (Exercise) 12   Perceived Dyspnea (Exercise) 1   Duration Continue with 45 min of aerobic exercise without signs/symptoms of physical distress.   Intensity THRR unchanged     Resistance Training   Training Prescription Yes   Weight blue bands   Reps 10-15     Bike   Level 3   Minutes 17     NuStep   Level 3   Minutes 17   METs 2      History  Smoking Status  . Former Smoker  . Quit date: 02/10/1971  Smokeless Tobacco  . Never Used    Comment: quit smoking 21yr ago    Goals Met:  Exercise tolerated  well No report of cardiac concerns or symptoms Strength training completed today  Goals Unmet:  Not Applicable  Comments: Service time is from 10:30a to 12:23p    Dr. WRush Farmeris [facility]

## 2017-02-22 ENCOUNTER — Other Ambulatory Visit: Payer: Self-pay | Admitting: Internal Medicine

## 2017-02-26 ENCOUNTER — Encounter (HOSPITAL_COMMUNITY)
Admission: RE | Admit: 2017-02-26 | Discharge: 2017-02-26 | Disposition: A | Discharge status: Home / Self Care | Payer: Medicare Other | Source: Ambulatory Visit | Attending: Cardiology | Admitting: Cardiology

## 2017-02-26 VITALS — Wt 170.0 lb

## 2017-02-26 DIAGNOSIS — I739 Peripheral vascular disease, unspecified: Secondary | ICD-10-CM | POA: Diagnosis not present

## 2017-02-26 DIAGNOSIS — E785 Hyperlipidemia, unspecified: Secondary | ICD-10-CM | POA: Diagnosis not present

## 2017-02-26 DIAGNOSIS — I5022 Chronic systolic (congestive) heart failure: Secondary | ICD-10-CM | POA: Diagnosis not present

## 2017-02-26 DIAGNOSIS — I130 Hypertensive heart and chronic kidney disease with heart failure and stage 1 through stage 4 chronic kidney disease, or unspecified chronic kidney disease: Secondary | ICD-10-CM | POA: Diagnosis not present

## 2017-02-26 DIAGNOSIS — I255 Ischemic cardiomyopathy: Secondary | ICD-10-CM | POA: Diagnosis not present

## 2017-02-26 DIAGNOSIS — N183 Chronic kidney disease, stage 3 (moderate): Secondary | ICD-10-CM | POA: Diagnosis not present

## 2017-02-26 NOTE — Progress Notes (Signed)
Daily Session Note  Patient Details  Name: Marcus [name] MRN: [id number] Date of Birth: Mar 25, 1929 Referring Provider:     Pulmonary Rehab Walk Test from 02/07/2017 in [place]  Referring Provider  Aundra Dubin      Encounter Date: 02/26/2017  Check In:     Session Check In - 02/26/17 1026      Check-In   Location [facility]   Staff Present Trish Fountain, RN, BSN;Bernice Mullin Ysidro Evert, RN;Molly diVincenzo, MS, ACSM RCEP, Exercise Physiologist   Supervising physician immediately available to respond to emergencies [facility] immediately available   Physician(s) Dr. Zigmund Daniel   Medication changes reported     No   Fall or balance concerns reported    No   Tobacco Cessation No Change   Warm-up and Cool-down Performed as group-led instruction   Resistance Training Performed Yes   VAD Patient? No     Pain Assessment   Currently in Pain? No/denies   Multiple Pain Sites No      Capillary Blood Glucose: No results found for this or any previous visit (from the past 24 hour(s)).      Exercise Prescription Changes - 02/26/17 1300      Response to Exercise   Blood Pressure (Admit) 110/60   Blood Pressure (Exercise) 128/78   Blood Pressure (Exit) 118/70   Heart Rate (Admit) 92 bpm   Heart Rate (Exercise) 95 bpm   Heart Rate (Exit) 75 bpm   Oxygen Saturation (Admit) 100 %   Oxygen Saturation (Exercise) 100 %   Oxygen Saturation (Exit) 100 %   Rating of Perceived Exertion (Exercise) 11   Perceived Dyspnea (Exercise) 2   Duration Continue with 45 min of aerobic exercise without signs/symptoms of physical distress.   Intensity THRR unchanged     Resistance Training   Training Prescription Yes   Weight blue bands   Reps 10-15   Time 10 Minutes     Bike   Level 3   Minutes 17     NuStep   Level 3   Minutes 17   METs 2      History  Smoking Status  . Former Smoker  . Quit date: 02/10/1971  Smokeless Tobacco  . Never  Used    Comment: quit smoking 54yr ago    Goals Met:  Exercise tolerated well No report of cardiac concerns or symptoms Strength training completed today  Goals Unmet:  Not Applicable  Comments: Service time is from 1030 to 1230    Dr. WRush Farmeris [facility]

## 2017-02-28 ENCOUNTER — Encounter (HOSPITAL_COMMUNITY)
Admission: RE | Admit: 2017-02-28 | Discharge: 2017-02-28 | Disposition: A | Discharge status: Home / Self Care | Payer: Medicare Other | Source: Ambulatory Visit | Attending: Cardiology | Admitting: Cardiology

## 2017-02-28 VITALS — Wt 172.2 lb

## 2017-02-28 DIAGNOSIS — I255 Ischemic cardiomyopathy: Secondary | ICD-10-CM | POA: Diagnosis not present

## 2017-02-28 DIAGNOSIS — I739 Peripheral vascular disease, unspecified: Secondary | ICD-10-CM | POA: Diagnosis not present

## 2017-02-28 DIAGNOSIS — N183 Chronic kidney disease, stage 3 (moderate): Secondary | ICD-10-CM | POA: Diagnosis not present

## 2017-02-28 DIAGNOSIS — I130 Hypertensive heart and chronic kidney disease with heart failure and stage 1 through stage 4 chronic kidney disease, or unspecified chronic kidney disease: Secondary | ICD-10-CM | POA: Diagnosis not present

## 2017-02-28 DIAGNOSIS — I5022 Chronic systolic (congestive) heart failure: Secondary | ICD-10-CM

## 2017-02-28 DIAGNOSIS — E785 Hyperlipidemia, unspecified: Secondary | ICD-10-CM | POA: Diagnosis not present

## 2017-02-28 NOTE — Progress Notes (Signed)
Daily Session Note  Patient Details  Name: Marcus [name] MRN: [id number] Date of Birth: [birth year] Referring Provider:     Pulmonary Rehab Walk Test from 02/07/2017 in [place]  Referring Provider  Aundra Dubin      Encounter Date: 02/28/2017  Check In:     Session Check In - 02/28/17 0959      Check-In   [facility]   Staff Present Trish Fountain, RN, BSN;Keenon Leitzel Ysidro Evert, RN;Molly diVincenzo, MS, ACSM RCEP, Exercise Physiologist   Supervising physician immediately available to respond to emergencies [facility] immediately available   Physician(s) Dr. Verlon Au   Medication changes reported     No   Fall or balance concerns reported    No   Tobacco Cessation No Change   Warm-up and Cool-down Performed as group-led instruction   Resistance Training Performed Yes   VAD Patient? No     Pain Assessment   Currently in Pain? No/denies   Multiple Pain Sites No      Capillary Blood Glucose: No results found for this or any previous visit (from the past 24 hour(s)).      Exercise Prescription Changes - 02/28/17 1200      Response to Exercise   Blood Pressure (Admit) 100/56   Blood Pressure (Exercise) 100/62   Blood Pressure (Exit) 110/60   Heart Rate (Admit) 76 bpm   Heart Rate (Exercise) 80 bpm   Heart Rate (Exit) 70 bpm   Oxygen Saturation (Admit) 98 %   Oxygen Saturation (Exercise) 100 %   Oxygen Saturation (Exit) 100 %   Rating of Perceived Exertion (Exercise) 13   Perceived Dyspnea (Exercise) 1   Duration Continue with 45 min of aerobic exercise without signs/symptoms of physical distress.   Intensity THRR unchanged     Resistance Training   Training Prescription Yes   Weight blue bands   Reps 10-15   Time 10 Minutes     Bike   Level 2   Minutes 17     NuStep   Level 3   Minutes 17   METs 1.5     Track   Laps 10   Minutes 17      History  Smoking Status  . Former Smoker  . Quit date:  02/10/1971  Smokeless Tobacco  . Never Used    Comment: quit smoking 92yr ago    Goals Met:  No report of cardiac concerns or symptoms Strength training completed today  Goals Unmet:  Not Applicable  Comments: Service time is from 1030 to 1210    Dr. WRush Farmeris [facility]

## 2017-02-28 NOTE — Progress Notes (Signed)
Marcus [name] 83 y.o. male  DOB: December 25, 1928 MRN: [id number]           Nutrition Note 1. Heart failure, chronic systolic (HCC)   Note Spoke with pt. Pt is overweight and wants to lose wt "around the middle." Wt loss discussed. There are some ways the pt can make his eating habits healthier. Pt's Rate Your Plate results reviewed with pt. Pt does not avoid salty food "except I don't add salt to food"; uses canned/ convenience food.  The role of sodium in lung disease reviewed with pt. Age-appropriate nutrition recommendations discussed. Pt expressed understanding of the information reviewed via feedback method.    Nutrition Diagnosis ? Food-and nutrition-related knowledge deficit related to lack of exposure to information as related to diagnosis of pulmonary disease ? Overweight related to excessive energy intake as evidenced by a BMI of 27  Nutrition Intervention ? Pt's individual nutrition plan and goals reviewed with pt. ? Benefits of adopting healthy eating habits discussed when pt's Rate Your Plate reviewed.  Goal(s) 1. The pt will recognize symptoms that can interfere with adequate oral intake, such as shortness of breath, N/V, early satiety, fatigue, ability to secure and prepare food, taste and smell changes, chewing/swallowing difficulties, and/ or pain when eating. 2. Identify food quantities necessary to achieve wt loss of  -2# per week to a goal wt loss of 6-10 lb at graduation from pulmonary rehab. Plan:  Pt to attend Pulmonary Nutrition class Will provide client-centered nutrition education as part of interdisciplinary care.   Monitor and evaluate progress toward nutrition goal with team.  Monitor and Evaluate progress toward nutrition goal with team.   Derek Mound, M.Ed, RD, LDN, CDE 02/28/2017 12:05 PM

## 2017-03-05 ENCOUNTER — Encounter (HOSPITAL_COMMUNITY)
Admission: RE | Admit: 2017-03-05 | Discharge: 2017-03-05 | Disposition: A | Discharge status: Home / Self Care | Payer: Medicare Other | Source: Ambulatory Visit | Attending: Cardiology | Admitting: Cardiology

## 2017-03-05 VITALS — Wt 170.9 lb

## 2017-03-05 DIAGNOSIS — I5022 Chronic systolic (congestive) heart failure: Secondary | ICD-10-CM

## 2017-03-05 DIAGNOSIS — N183 Chronic kidney disease, stage 3 (moderate): Secondary | ICD-10-CM | POA: Diagnosis not present

## 2017-03-05 DIAGNOSIS — I130 Hypertensive heart and chronic kidney disease with heart failure and stage 1 through stage 4 chronic kidney disease, or unspecified chronic kidney disease: Secondary | ICD-10-CM | POA: Diagnosis not present

## 2017-03-05 DIAGNOSIS — I739 Peripheral vascular disease, unspecified: Secondary | ICD-10-CM | POA: Diagnosis not present

## 2017-03-05 DIAGNOSIS — E785 Hyperlipidemia, unspecified: Secondary | ICD-10-CM | POA: Diagnosis not present

## 2017-03-05 DIAGNOSIS — I255 Ischemic cardiomyopathy: Secondary | ICD-10-CM | POA: Diagnosis not present

## 2017-03-05 NOTE — Progress Notes (Signed)
Daily Session Note  Patient Details  Name: Marcus [name] MRN: [id number] Date of Birth: 24-Mar-1929 Referring Provider:     Pulmonary Rehab Walk Test from 02/07/2017 in [place]  Referring Provider  Aundra Dubin      Encounter Date: 03/05/2017  Check In:     Session Check In - 03/05/17 1017      Check-In   Location [facility]   Staff Present Su Hilt, MS, ACSM RCEP, Exercise Physiologist;Lisa Ysidro Evert, RN;Portia Rollene Rotunda, RN, BSN   Supervising physician immediately available to respond to emergencies [facility] immediately available   Physician(s) Dr. Verlon Au   Medication changes reported     No   Fall or balance concerns reported    No   Tobacco Cessation No Change   Warm-up and Cool-down Performed as group-led instruction   Resistance Training Performed Yes   VAD Patient? No     Pain Assessment   Currently in Pain? No/denies   Multiple Pain Sites No      Capillary Blood Glucose: No results found for this or any previous visit (from the past 24 hour(s)).      Exercise Prescription Changes - 03/05/17 1200      Response to Exercise   Blood Pressure (Admit) 106/66   Blood Pressure (Exercise) 118/70   Blood Pressure (Exit) 102/70   Heart Rate (Admit) 76 bpm   Heart Rate (Exercise) 88 bpm   Heart Rate (Exit) 70 bpm   Oxygen Saturation (Admit) 99 %   Oxygen Saturation (Exercise) 97 %   Oxygen Saturation (Exit) 100 %   Rating of Perceived Exertion (Exercise) 13   Perceived Dyspnea (Exercise) 2   Duration Continue with 45 min of aerobic exercise without signs/symptoms of physical distress.   Intensity THRR unchanged     Resistance Training   Training Prescription Yes   Weight blue bands   Reps 10-15   Time 10 Minutes     Bike   Level 4   Minutes 17     NuStep   Level 4   Minutes 17   METs 2.7     Track   Laps 9   Minutes 17      History  Smoking Status  . Former Smoker  . Quit date:  02/10/1971  Smokeless Tobacco  . Never Used    Comment: quit smoking 55yr ago    Goals Met:  Exercise tolerated well No report of cardiac concerns or symptoms Strength training completed today  Goals Unmet:  Not Applicable  Comments: Service time is from 10:30a to 12:10p    Dr. WRush Farmeris [facility]

## 2017-03-07 ENCOUNTER — Encounter (HOSPITAL_COMMUNITY)
Admission: RE | Admit: 2017-03-07 | Discharge: 2017-03-07 | Disposition: A | Discharge status: Home / Self Care | Payer: Medicare Other | Source: Ambulatory Visit | Attending: Cardiology | Admitting: Cardiology

## 2017-03-07 VITALS — Wt 172.6 lb

## 2017-03-07 DIAGNOSIS — E785 Hyperlipidemia, unspecified: Secondary | ICD-10-CM | POA: Diagnosis not present

## 2017-03-07 DIAGNOSIS — I5022 Chronic systolic (congestive) heart failure: Secondary | ICD-10-CM

## 2017-03-07 DIAGNOSIS — I130 Hypertensive heart and chronic kidney disease with heart failure and stage 1 through stage 4 chronic kidney disease, or unspecified chronic kidney disease: Secondary | ICD-10-CM | POA: Diagnosis not present

## 2017-03-07 DIAGNOSIS — I739 Peripheral vascular disease, unspecified: Secondary | ICD-10-CM | POA: Diagnosis not present

## 2017-03-07 DIAGNOSIS — N183 Chronic kidney disease, stage 3 (moderate): Secondary | ICD-10-CM | POA: Diagnosis not present

## 2017-03-07 DIAGNOSIS — I255 Ischemic cardiomyopathy: Secondary | ICD-10-CM | POA: Diagnosis not present

## 2017-03-07 NOTE — Progress Notes (Signed)
Daily Session Note  Patient Details  Name: Marcus [name] MRN: [id number] Date of Birth: 03/31/1929 Referring Provider:     Pulmonary Rehab Walk Test from 02/07/2017 in [place]  Referring Provider  Aundra Dubin      Encounter Date: 03/07/2017  Check In:     Session Check In - 03/07/17 1010      Check-In   Location [facility]   Staff Present Su Hilt, MS, ACSM RCEP, Exercise Physiologist;Lisa Ysidro Evert, RN;Portia Rollene Rotunda, RN, BSN   Supervising physician immediately available to respond to emergencies [facility] immediately available   Physician(s) Dr. Horris Latino   Medication changes reported     No   Fall or balance concerns reported    No   Tobacco Cessation No Change   Warm-up and Cool-down Performed as group-led instruction   Resistance Training Performed Yes   VAD Patient? No     Pain Assessment   Currently in Pain? No/denies   Multiple Pain Sites No      Capillary Blood Glucose: No results found for this or any previous visit (from the past 24 hour(s)).      Exercise Prescription Changes - 03/07/17 1200      Response to Exercise   Blood Pressure (Admit) 114/64   Blood Pressure (Exercise) 118/70   Blood Pressure (Exit) 114/64   Heart Rate (Admit) 78 bpm   Heart Rate (Exercise) 86 bpm   Heart Rate (Exit) 73 bpm   Oxygen Saturation (Admit) 100 %   Oxygen Saturation (Exercise) 100 %   Oxygen Saturation (Exit) 100 %   Rating of Perceived Exertion (Exercise) 13   Perceived Dyspnea (Exercise) 2   Duration Continue with 45 min of aerobic exercise without signs/symptoms of physical distress.   Intensity THRR unchanged     Resistance Training   Training Prescription Yes   Weight blue bands   Reps 10-15   Time 10 Minutes     Bike   Level 4   Minutes 17     Track   Laps 9   Minutes 17      History  Smoking Status  . Former Smoker  . Quit date: 02/10/1971  Smokeless Tobacco  . Never Used   Comment: quit smoking 71yr ago    Goals Met:  Exercise tolerated well No report of cardiac concerns or symptoms Strength training completed today  Goals Unmet:  Not Applicable  Comments: Service time is from 10:30a to 12:35p    Dr. WRush Farmeris [facility]

## 2017-03-11 ENCOUNTER — Ambulatory Visit (INDEPENDENT_AMBULATORY_CARE_PROVIDER_SITE_OTHER): Payer: Medicare Other

## 2017-03-11 DIAGNOSIS — I5022 Chronic systolic (congestive) heart failure: Secondary | ICD-10-CM

## 2017-03-11 DIAGNOSIS — Z950 Presence of cardiac pacemaker: Secondary | ICD-10-CM | POA: Diagnosis not present

## 2017-03-11 NOTE — Progress Notes (Signed)
Pulmonary Individual Treatment Plan  Patient Details  Name: Marcus [name] MRN: [id number] Date of Birth: 02-Jul-1928 Referring Provider:     Pulmonary Rehab Walk Test from 02/07/2017 in [place]  Referring Provider  Aundra Dubin      Initial Encounter Date:    Pulmonary Rehab Walk Test from 02/07/2017 in [place]  Date  02/12/17  Referring Provider  Aundra Dubin      Visit Diagnosis: Heart failure, chronic systolic ([place])  Patient's Home Medications on Admission:   Current Outpatient Prescriptions:  .  amiodarone (PACERONE) 200 MG tablet, Take 0.5 tablets (100 mg total) by mouth daily., Disp: 45 tablet, Rfl: 3 .  apixaban (ELIQUIS) 2.5 MG TABS tablet, Take 1 tablet (2.5 mg total) by mouth 2 (two) times daily., Disp: 180 tablet, Rfl: 3 .  carvedilol (COREG) 6.25 MG tablet, Take 1 tablet (6.25 mg total) by mouth 2 (two) times daily with a meal., Disp: 180 tablet, Rfl: 2 .  Coenzyme Q10 (CO Q 10) 100 MG CAPS, Take 1 capsule by mouth 2 (two) times daily., Disp: , Rfl:  .  GLUTATHIONE PO, Take by mouth daily. Take as directed, Disp: , Rfl:  .  isosorbide mononitrate (IMDUR) 30 MG 24 hr tablet, Take 0.5 tablets (15 mg total) by mouth daily., Disp: 45 tablet, Rfl: 3 .  Multiple Vitamins-Minerals (MULTIVITAMINS/MINERALS ADULT PO), Take by mouth daily. MacuGuard, Disp: , Rfl:  .  Multiple Vitamins-Minerals (PRESERVISION AREDS 2 PO), Take 1 capsule by mouth 2 (two) times daily., Disp: , Rfl:  .  spironolactone (ALDACTONE) 25 MG tablet, TAKE ONE-HALF TABLET BY MOUTH ONCE DAILY, Disp: 45 tablet, Rfl: 3 .  torsemide (DEMADEX) 20 MG tablet, Take 40 mg (2 tabs) one day alternating with 20 mg (1 tab) the next day continuously. (Patient taking differently: Take 40 mg (2 tabs) one day alternating with 20 mg (1 tab) the next day continuously.  Wife reported script was changed to 20 mg daily.), Disp: 45 tablet, Rfl: 6  Past Medical History: Past  Medical History:  Diagnosis Date  . Atrial flutter ([place])    a. Dx 07/2014, s/p TEE/DCCV.  . Cataracts, bilateral   . Chronic systolic CHF (congestive heart failure) ([place])    a. s/p CRT-D 09/2013. b. Device explant with temp perm then subsequent CRT-P 07/2014.  Marland Kitchen CKD (chronic kidney disease), stage III   . Coronary artery disease    a. s/p remote CABG x 2(VG->OM, LIMA->LAD;  b. Late 90's s/p PCI of RCA;  c. 12/09 Cath/PCI: LM 80m, 70-80d (3.0x73mm Xience DES), LAD100p, LCX 80-90p (2.25x33mmTaxus Atom DES), RCA 100d (2.5x28mm Xience DES), VG->OM 100, LIMA->LAD nl, EF 30-35%.  Marland Kitchen DVT (deep venous thrombosis) ([place]) 2017   Right knee  . Hard of hearing   . History of colon polyps   . History of gastric ulcer   . History of gout   . History of shingles   . Hyperlipidemia   . Hypertension   . Ischemic cardiomyopathy    a. EF 35-40% by echo 05/2012, b. EF 20-25%, akinesis and scarring of inferolateral, inferior and inferoseptal myocardium, mild AI, mod MR, LA mod dilated, RV mildly dialted and sys fx mildly reduced, RA mildly dilated (09/2013)  c. EF 35-40% by 2D in 07/2014, 25-30% by TEE.  . Macular degeneration, dry   . Pacemaker infection ([place])   . PAF (paroxysmal atrial fibrillation) ([place])   . Peripheral vascular disease ([place])   . Presence  of permanent cardiac pacemaker 07/23/2014   [facility] [phone], Serial [id number]  . Prostate cancer ([place])   . Symptomatic bradycardia    a. 08/2003 s/p MDT Enpulse [id number] Dual chamber PPM ser # [id number] H. b. Device explant due to infection/temp perm insertion/PPM 07/2014.    Tobacco Use: History  Smoking Status  . Former Smoker  . Quit date: 02/10/1971  Smokeless Tobacco  . Never Used    Comment: quit smoking 63yrs ago    Labs: Recent Review Flowsheet Data    Labs for ITP Cardiac and Pulmonary Rehab Latest Ref Rng & Units 07/20/2014 07/21/2014 07/22/2014 03/01/2016 03/01/2016   Cholestrol 0 - 200 mg/dL - - - - -   LDLCALC 0 - 99 mg/dL - - - - -    HDL >39.00 mg/dL - - - - -   Trlycerides 0.0 - 149.0 mg/dL - - - - -   HCO3 20.0 - 28.0 mmol/L - - - 19.1(L) 18.8(L)   TCO2 0 - 100 mmol/L - - - 20 20   ACIDBASEDEF 0.0 - 2.0 mmol/L - - - 8.0(H) 8.0(H)   O2SAT % 68.7 82.3 68.4 58.0 61.0      Capillary Blood Glucose: Lab Results  Component Value Date   GLUCAP 107 (H) 07/24/2014     Pulmonary Assessment Scores:     Pulmonary Assessment Scores    Row Name 02/12/17 0723 02/13/17 1407       ADL UCSD   ADL Phase Entry Entry    SOB Score total  - 30      CAT Score   CAT Score  - 13  Entry      mMRC Score   mMRC Score 1  -       Pulmonary Function Assessment:     Pulmonary Function Assessment - 02/01/17 1312      Breath   Bilateral Breath Sounds Clear   Shortness of Breath Yes;Limiting activity      Exercise Target Goals:    Exercise Program Goal: Individual exercise prescription set with THRR, safety & activity barriers. Participant demonstrates ability to understand and report RPE using BORG scale, to self-measure pulse accurately, and to acknowledge the importance of the exercise prescription.  Exercise Prescription Goal: Starting with aerobic activity 30 plus minutes a day, 3 days per week for initial exercise prescription. Provide home exercise prescription and guidelines that participant acknowledges understanding prior to discharge.  Activity Barriers & Risk Stratification:     Activity Barriers & Cardiac Risk Stratification - 02/01/17 1255      Activity Barriers & Cardiac Risk Stratification   Activity Barriers Decreased Ventricular Function;Balance Concerns      6 Minute Walk:     6 Minute Walk    Row Name 02/12/17 0719         6 Minute Walk   Phase Initial     Distance 1220 feet     Walk Time 6 minutes     # of Rest Breaks 0     MPH 2.31     METS 2.76     RPE 12     Perceived Dyspnea  1     Symptoms No     Resting HR 74 bpm     Resting BP 126/76     Resting Oxygen Saturation   95 %     Exercise Oxygen Saturation  during 6 min walk 100 %     Max Ex. HR 97 bpm  Max Ex. BP 133/77       Interval HR   1 Minute HR 78     2 Minute HR 91     3 Minute HR 75     4 Minute HR 96     5 Minute HR 97     6 Minute HR 95     2 Minute Post HR 81     Interval Heart Rate? Yes       Interval Oxygen   Interval Oxygen? Yes     Baseline Oxygen Saturation % 95 %     1 Minute Oxygen Saturation % 100 %     1 Minute Liters of Oxygen 0 L     2 Minute Oxygen Saturation % 100 %     2 Minute Liters of Oxygen 0 L     3 Minute Oxygen Saturation % 100 %     3 Minute Liters of Oxygen 0 L     4 Minute Oxygen Saturation % 100 %     4 Minute Liters of Oxygen 0 L     5 Minute Oxygen Saturation % 100 %     5 Minute Liters of Oxygen 0 L     6 Minute Oxygen Saturation % 100 %     6 Minute Liters of Oxygen 0 L     2 Minute Post Oxygen Saturation % 100 %     2 Minute Post Liters of Oxygen 0 L        Oxygen Initial Assessment:     Oxygen Initial Assessment - 02/12/17 0723      Initial 6 min Walk   Oxygen Used None     Program Oxygen Prescription   Program Oxygen Prescription None      Oxygen Re-Evaluation:     Oxygen Re-Evaluation    Row Name 03/10/17 2055             Program Oxygen Prescription   Program Oxygen Prescription None         Home Oxygen   Home Oxygen Device None       Sleep Oxygen Prescription None       Home Exercise Oxygen Prescription None       Home at Rest Exercise Oxygen Prescription None          Oxygen Discharge (Final Oxygen Re-Evaluation):     Oxygen Re-Evaluation - 03/10/17 2055      Program Oxygen Prescription   Program Oxygen Prescription None     Home Oxygen   Home Oxygen Device None   Sleep Oxygen Prescription None   Home Exercise Oxygen Prescription None   Home at Rest Exercise Oxygen Prescription None      Initial Exercise Prescription:     Initial Exercise Prescription - 02/12/17 0700      Date of Initial  Exercise RX and Referring Provider   Date 02/12/17   Referring Provider Aundra Dubin     Bike   Level 2  scitfit   Minutes 17     NuStep   Level 2   Minutes 17   METs 1.5     Track   Laps 10   Minutes 17     Prescription Details   Frequency (times per week) 2   Duration Progress to 45 minutes of aerobic exercise without signs/symptoms of physical distress     Intensity   THRR 40-80% of Max Heartrate 54-108   Ratings of Perceived Exertion 11-13   Perceived  Dyspnea 0-4     Progression   Progression Continue progressive overload as per policy without signs/symptoms or physical distress.     Resistance Training   Training Prescription Yes   Weight blue bands   Reps 10-15      Perform Capillary Blood Glucose checks as needed.  Exercise Prescription Changes:     Exercise Prescription Changes    Row Name 02/12/17 1200 02/14/17 1200 02/19/17 1200 02/21/17 1200 02/26/17 1300     Response to Exercise   Blood Pressure (Admit) 100/62 108/66 104/60 118/68 110/60   Blood Pressure (Exercise) 138/74 128/66 122/60 120/76 128/78   Blood Pressure (Exit) 102/64 104/72 124/76 120/60 118/70   Heart Rate (Admit) 86 bpm 95 bpm 88 bpm 81 bpm 92 bpm   Heart Rate (Exercise) 102 bpm 87 bpm 94 bpm 94 bpm 95 bpm   Heart Rate (Exit) 80 bpm 70 bpm 74 bpm 69 bpm 75 bpm   Oxygen Saturation (Admit) 100 % 100 % 100 % 100 % 100 %   Oxygen Saturation (Exercise) 98 % 100 % 100 % 97 % 100 %   Oxygen Saturation (Exit) 100 % 99 % 100 % 97 % 100 %   Rating of Perceived Exertion (Exercise) 11 12 13 12 11    Perceived Dyspnea (Exercise) 1 1 2 1 2    Duration Continue with 45 min of aerobic exercise without signs/symptoms of physical distress. Continue with 45 min of aerobic exercise without signs/symptoms of physical distress. Continue with 45 min of aerobic exercise without signs/symptoms of physical distress. Continue with 45 min of aerobic exercise without signs/symptoms of physical distress. Continue with 45  min of aerobic exercise without signs/symptoms of physical distress.   Intensity THRR unchanged THRR unchanged THRR unchanged THRR unchanged THRR unchanged     Resistance Training   Training Prescription Yes Yes Yes Yes Yes   Weight blue bands blue bands blue bands blue bands blue bands   Reps 10-15 10-15 10-15 10-15 10-15   Time  -  -  -  - 10 Minutes     Bike   Level 2  scitfit  - 2 3 3    Minutes 17  - 17 17 17      NuStep   Level 2 2 2 3 3    Minutes 17 17 17 17 17    METs 2 2.2 2.2 2 2      Track   Laps 11 10 10   -  -   Minutes 17 17 17   -  -   Row Name 02/28/17 1200 03/05/17 1200 03/07/17 1200         Response to Exercise   Blood Pressure (Admit) 100/56 106/66 114/64     Blood Pressure (Exercise) 100/62 118/70 118/70     Blood Pressure (Exit) 110/60 102/70 114/64     Heart Rate (Admit) 76 bpm 76 bpm 78 bpm     Heart Rate (Exercise) 80 bpm 88 bpm 86 bpm     Heart Rate (Exit) 70 bpm 70 bpm 73 bpm     Oxygen Saturation (Admit) 98 % 99 % 100 %     Oxygen Saturation (Exercise) 100 % 97 % 100 %     Oxygen Saturation (Exit) 100 % 100 % 100 %     Rating of Perceived Exertion (Exercise) 13 13 13      Perceived Dyspnea (Exercise) 1 2 2      Duration Continue with 45 min of aerobic exercise without signs/symptoms of physical distress. Continue  with 45 min of aerobic exercise without signs/symptoms of physical distress. Continue with 45 min of aerobic exercise without signs/symptoms of physical distress.     Intensity THRR unchanged THRR unchanged THRR unchanged       Resistance Training   Training Prescription Yes Yes Yes     Weight blue bands blue bands blue bands     Reps 10-15 10-15 10-15     Time 10 Minutes 10 Minutes 10 Minutes       Bike   Level 2 4 4      Minutes 17 17 17        NuStep   Level 3 4  -     Minutes 17 17  -     METs 1.5 2.7  -       Track   Laps 10 9 9      Minutes 17 17 17         Exercise Comments:   Exercise Goals and Review:     Exercise  Goals    Row Name 02/01/17 1256             Exercise Goals   Increase Physical Activity Yes       Intervention Provide advice, education, support and counseling about physical activity/exercise needs.;Develop an individualized exercise prescription for aerobic and resistive training based on initial evaluation findings, risk stratification, comorbidities and participant's personal goals.       Expected Outcomes Achievement of increased cardiorespiratory fitness and enhanced flexibility, muscular endurance and strength shown through measurements of functional capacity and personal statement of participant.       Increase Strength and Stamina Yes       Intervention Provide advice, education, support and counseling about physical activity/exercise needs.;Develop an individualized exercise prescription for aerobic and resistive training based on initial evaluation findings, risk stratification, comorbidities and participant's personal goals.       Expected Outcomes Achievement of increased cardiorespiratory fitness and enhanced flexibility, muscular endurance and strength shown through measurements of functional capacity and personal statement of participant.       Able to understand and use rate of perceived exertion (RPE) scale Yes       Intervention Provide education and explanation on how to use RPE scale       Expected Outcomes Short Term: Able to use RPE daily in rehab to express subjective intensity level;Long Term:  Able to use RPE to guide intensity level when exercising independently       Able to understand and use Dyspnea scale Yes       Intervention Provide education and explanation on how to use Dyspnea scale       Expected Outcomes Short Term: Able to use Dyspnea scale daily in rehab to express subjective sense of shortness of breath during exertion;Long Term: Able to use Dyspnea scale to guide intensity level when exercising independently       Knowledge and understanding of Target  Heart Rate Range (THRR) Yes       Intervention Provide education and explanation of THRR including how the numbers were predicted and where they are located for reference       Expected Outcomes Short Term: Able to state/look up THRR;Long Term: Able to use THRR to govern intensity when exercising independently;Short Term: Able to use daily as guideline for intensity in rehab       Understanding of Exercise Prescription Yes       Intervention Provide education, explanation, and written materials on patient's individual  exercise prescription       Expected Outcomes Short Term: Able to explain program exercise prescription;Long Term: Able to explain home exercise prescription to exercise independently          Exercise Goals Re-Evaluation :     Exercise Goals Re-Evaluation    Row Name 03/11/17 1008             Exercise Goal Re-Evaluation   Exercise Goals Review Increase Physical Activity;Increase Strength and Stamina;Able to understand and use Dyspnea scale;Able to understand and use rate of perceived exertion (RPE) scale;Knowledge and understanding of Target Heart Rate Range (THRR);Understanding of Exercise Prescription       Comments Patient is progressing well in program. Has a great attitude. Willing to work at his higher threshold. Will cont. to monitor and progress as able.        Expected Outcomes Through exercise at rehab and at home, patient will increase strength and stamina and decrease fatigue.          Discharge Exercise Prescription (Final Exercise Prescription Changes):     Exercise Prescription Changes - 03/07/17 1200      Response to Exercise   Blood Pressure (Admit) 114/64   Blood Pressure (Exercise) 118/70   Blood Pressure (Exit) 114/64   Heart Rate (Admit) 78 bpm   Heart Rate (Exercise) 86 bpm   Heart Rate (Exit) 73 bpm   Oxygen Saturation (Admit) 100 %   Oxygen Saturation (Exercise) 100 %   Oxygen Saturation (Exit) 100 %   Rating of Perceived Exertion  (Exercise) 13   Perceived Dyspnea (Exercise) 2   Duration Continue with 45 min of aerobic exercise without signs/symptoms of physical distress.   Intensity THRR unchanged     Resistance Training   Training Prescription Yes   Weight blue bands   Reps 10-15   Time 10 Minutes     Bike   Level 4   Minutes 17     Track   Laps 9   Minutes 17      Nutrition:  Target Goals: Understanding of nutrition guidelines, daily intake of sodium 1500mg , cholesterol 200mg , calories 30% from fat and 7% or less from saturated fats, daily to have 5 or more servings of fruits and vegetables.  Biometrics:     Pre Biometrics - 02/01/17 1257      Pre Biometrics   Grip Strength 36 kg       Nutrition Therapy Plan and Nutrition Goals:     Nutrition Therapy & Goals - 02/04/17 1206      Nutrition Therapy   Diet Low Sodium     Personal Nutrition Goals   Nutrition Goal The pt will recognize symptoms that can interfere with adequate oral intake, such as shortness of breath, N/V, early satiety, fatigue, ability to secure and prepare food, taste and smell changes, chewing/swallowing difficulties, and/ or pain when eating.   Personal Goal #2 Identify food quantities necessary to achieve wt loss of  -2# per week to a goal wt loss of 6-10 lb at graduation from pulmonary rehab.     Intervention Plan   Intervention Prescribe, educate and counsel regarding individualized specific dietary modifications aiming towards targeted core components such as weight, hypertension, lipid management, diabetes, heart failure and other comorbidities.   Expected Outcomes Short Term Goal: Understand basic principles of dietary content, such as calories, fat, sodium, cholesterol and nutrients.;Long Term Goal: Adherence to prescribed nutrition plan.      Nutrition Discharge: Rate Your Plate Scores:  Nutrition Assessments - 02/04/17 1206      Rate Your Plate Scores   Pre Score 49      Nutrition Goals  Re-Evaluation:   Nutrition Goals Discharge (Final Nutrition Goals Re-Evaluation):   Psychosocial: Target Goals: Acknowledge presence or absence of significant depression and/or stress, maximize coping skills, provide positive support system. Participant is able to verbalize types and ability to use techniques and skills needed for reducing stress and depression.  Initial Review & Psychosocial Screening:     Initial Psych Review & Screening - 02/01/17 1313      Initial Review   Current issues with None Identified     Family Dynamics   Good Support System? Yes     Barriers   Psychosocial barriers to participate in program There are no identifiable barriers or psychosocial needs.     Screening Interventions   Interventions Encouraged to exercise      Quality of Life Scores:   PHQ-9: Recent Review Flowsheet Data    Depression screen [facility] 2/9 02/01/2017   Decreased Interest 0   Down, Depressed, Hopeless 0   PHQ - 2 Score 0     Interpretation of Total Score  Total Score Depression Severity:  1-4 = Minimal depression, 5-9 = Mild depression, 10-14 = Moderate depression, 15-19 = Moderately severe depression, 20-27 = Severe depression   Psychosocial Evaluation and Intervention:     Psychosocial Evaluation - 02/01/17 1314      Psychosocial Evaluation & Interventions   Interventions Encouraged to exercise with the program and follow exercise prescription   Expected Outcomes patient will remain free from psychosocial barriers to participation in pulmonary rehab.   Continue Psychosocial Services  No Follow up required      Psychosocial Re-Evaluation:     Psychosocial Re-Evaluation    [place] Name 03/10/17 2059             Psychosocial Re-Evaluation   Current issues with None Identified       Expected Outcomes patient will remain free from psychosocial barriers to participation in pulmonary rehab       Interventions Encouraged to attend Pulmonary Rehabilitation for the  exercise       Continue Psychosocial Services  No Follow up required          Psychosocial Discharge (Final Psychosocial Re-Evaluation):     Psychosocial Re-Evaluation - 03/10/17 2059      Psychosocial Re-Evaluation   Current issues with None Identified   Expected Outcomes patient will remain free from psychosocial barriers to participation in pulmonary rehab   Interventions Encouraged to attend Pulmonary Rehabilitation for the exercise   Continue Psychosocial Services  No Follow up required      Education: Education Goals: Education classes will be provided on a weekly basis, covering required topics. Participant will state understanding/return demonstration of topics presented.  Learning Barriers/Preferences:     Learning Barriers/Preferences - 02/01/17 1311      Learning Barriers/Preferences   Learning Barriers None   Learning Preferences Written Material;Group Instruction;Verbal Instruction;Skilled Demonstration      Education Topics: Risk Factor Reduction:  -Group instruction that is supported by a PowerPoint presentation. Instructor discusses the definition of a risk factor, different risk factors for pulmonary disease, and how the heart and lungs work together.     Nutrition for Pulmonary Patient:  -Group instruction provided by PowerPoint slides, verbal discussion, and written materials to support subject matter. The instructor gives an explanation and review of healthy diet recommendations, which includes a  discussion on weight management, recommendations for fruit and vegetable consumption, as well as protein, fluid, caffeine, fiber, sodium, sugar, and alcohol. Tips for eating when patients are short of breath are discussed.   PULMONARY REHAB OTHER RESPIRATORY from 03/07/2017 in [place]  Date  03/07/17  Educator  RD  Instruction Review Code  2- meets goals/outcomes      Pursed Lip Breathing:  -Group instruction that is  supported by demonstration and informational handouts. Instructor discusses the benefits of pursed lip and diaphragmatic breathing and detailed demonstration on how to preform both.     Oxygen Safety:  -Group instruction provided by PowerPoint, verbal discussion, and written material to support subject matter. There is an overview of "What is Oxygen" and "Why do we need it".  Instructor also reviews how to create a safe environment for oxygen use, the importance of using oxygen as prescribed, and the risks of noncompliance. There is a brief discussion on traveling with oxygen and resources the patient may utilize.   Oxygen Equipment:  -Group instruction provided by [facility] utilizing handouts, written materials, and equipment demonstrations.   Signs and Symptoms:  -Group instruction provided by written material and verbal discussion to support subject matter. Warning signs and symptoms of infection, stroke, and heart attack are reviewed and when to call the physician/911 reinforced. Tips for preventing the spread of infection discussed.   Advanced Directives:  -Group instruction provided by verbal instruction and written material to support subject matter. Instructor reviews Advanced Directive laws and proper instruction for filling out document.   Pulmonary Video:  -Group video education that reviews the importance of medication and oxygen compliance, exercise, good nutrition, pulmonary hygiene, and pursed lip and diaphragmatic breathing for the pulmonary patient.   PULMONARY REHAB OTHER RESPIRATORY from 03/07/2017 in [place]  Date  02/14/17  Instruction Review Code  2- meets goals/outcomes      Exercise for the Pulmonary Patient:  -Group instruction that is supported by a PowerPoint presentation. Instructor discusses benefits of exercise, core components of exercise, frequency, duration, and intensity of an exercise routine, importance of  utilizing pulse oximetry during exercise, safety while exercising, and options of places to exercise outside of rehab.     Pulmonary Medications:  -Verbally interactive group education provided by instructor with focus on inhaled medications and proper administration.   Anatomy and Physiology of the Respiratory System and Intimacy:  -Group instruction provided by PowerPoint, verbal discussion, and written material to support subject matter. Instructor reviews respiratory cycle and anatomical components of the respiratory system and their functions. Instructor also reviews differences in obstructive and restrictive respiratory diseases with examples of each. Intimacy, Sex, and Sexuality differences are reviewed with a discussion on how relationships can change when diagnosed with pulmonary disease. Common sexual concerns are reviewed.   MD DAY -A group question and answer session with a medical doctor that allows participants to ask questions that relate to their pulmonary disease state.   PULMONARY REHAB OTHER RESPIRATORY from 03/07/2017 in [place]  Date  02/26/17  Educator  [email or web address]  Instruction Review Code  2- meets goals/outcomes      OTHER EDUCATION -Group or individual verbal, written, or video instructions that support the educational goals of the pulmonary rehab program.   Knowledge Questionnaire Score:     Knowledge Questionnaire Score - 02/13/17 1406      Knowledge Questionnaire Score   Pre Score 12/13  Core Components/Risk Factors/Patient Goals at Admission:     Personal Goals and Risk Factors at Admission - 02/01/17 1312      Core Components/Risk Factors/Patient Goals on Admission   Improve shortness of breath with ADL's Yes   Intervention Provide education, individualized exercise plan and daily activity instruction to help decrease symptoms of SOB with activities of daily living.   Expected Outcomes Short Term: Achieves a  reduction of symptoms when performing activities of daily living.   Develop more efficient breathing techniques such as purse lipped breathing and diaphragmatic breathing; and practicing self-pacing with activity Yes   Intervention Provide education, demonstration and support about specific breathing techniuqes utilized for more efficient breathing. Include techniques such as pursed lipped breathing, diaphragmatic breathing and self-pacing activity.   Expected Outcomes Short Term: Participant will be able to demonstrate and use breathing techniques as needed throughout daily activities.   Heart Failure Yes   Intervention Provide a combined exercise and nutrition program that is supplemented with education, support and counseling about heart failure. Directed toward relieving symptoms such as shortness of breath, decreased exercise tolerance, and extremity edema.   Expected Outcomes Improve functional capacity of life;Short term: Attendance in program 2-3 days a week with increased exercise capacity. Reported lower sodium intake. Reported increased fruit and vegetable intake. Reports medication compliance.;Short term: Daily weights obtained and reported for increase. Utilizing diuretic protocols set by physician.;Long term: Adoption of self-care skills and reduction of barriers for early signs and symptoms recognition and intervention leading to self-care maintenance.      Core Components/Risk Factors/Patient Goals Review:      Goals and Risk Factor Review    Row Name 03/10/17 2055             Core Components/Risk Factors/Patient Goals Review   Personal Goals Review Improve shortness of breath with ADL's;Develop more efficient breathing techniques such as purse lipped breathing and diaphragmatic breathing and practicing self-pacing with activity.;Heart Failure       Review patient is doing well in pulmonary rehab. he is tolerating increases in workloads and has been observed using PLB as  recommended. he continues to verbalize compliance with daily weights at home and his weight does not show a 3 lb wieght gain from one pulmonary session to another. He verbalizes some improvement in his shortness of breath.        Expected Outcomes see admission expected outcomes          Core Components/Risk Factors/Patient Goals at Discharge (Final Review):      Goals and Risk Factor Review - 03/10/17 2055      Core Components/Risk Factors/Patient Goals Review   Personal Goals Review Improve shortness of breath with ADL's;Develop more efficient breathing techniques such as purse lipped breathing and diaphragmatic breathing and practicing self-pacing with activity.;Heart Failure   Review patient is doing well in pulmonary rehab. he is tolerating increases in workloads and has been observed using PLB as recommended. he continues to verbalize compliance with daily weights at home and his weight does not show a 3 lb wieght gain from one pulmonary session to another. He verbalizes some improvement in his shortness of breath.    Expected Outcomes see admission expected outcomes      ITP Comments:   Comments: ITP REVIEW Pt is making expected progress toward pulmonary rehab goals after completing 8 sessions. Recommend continued exercise, life style modification, education, and utilization of breathing techniques to increase stamina and strength and decrease shortness of breath with exertion.

## 2017-03-11 NOTE — Progress Notes (Signed)
EPIC Encounter for ICM Monitoring  Patient Name: Marcus [name] is a 83 y.o. male Date: 03/11/2017 Primary Care Physican: Patient, No Pcp Per Primary Cardiologist:[place] Electrophysiologist: Lovena Le Dry Weight: unknown Bi-V Pacing: >99%      Spoke with wife.   Heart Failure questions reviewed, pt asymptomatic.   Thoracic impedance normal but was abnormal suggesting fluid accumulation 10/3 to 10/14.   Impedance pattern since April has shows majority of days in each month suggest fluid accumulation.   Prescribed dosage: Torsemide 20 mg daily.Is not taking Torsemide as prescribed, he skips doses on days when he is at cardiac rehab.   Labs: 01/09/2017 Creatinine 2.31, BUN 49, Potassium 4.1, Sodium 141, EGFR 24-28 12/05/2016 Creatinine 2.87, BUN 71, Potassium 4.3, Sodium 139, EGFR 19-22 11/22/2016 Creatinine 2.57, BUN 61, Potassium 4.3, Sodium 139, EGFR 21-25  06/14/2016 Creatinine 2.54. BUN 59, Potassium 4.9, Sodium 137, EGFR 22-25 06/01/2016 Creatinine 2.78, BUN 78, Potassium 4.5, Sodium 136, EGFR 19-22  05/22/2016 Creatinine 2.38, BUN 60, Potassium 4.7, Sodium 136, EGFR 23-27 05/08/2016 Creatinine 2.16, BUN 40, Potassium 4.5, Sodium 143, EGFR 26-30 03/26/2016 Creatinine 1.77, BUN 26, Potassium 4.3, Sodium 140, EGFR 33-39 02/21/2016 Creatinine 2.45, BUN 75, Potassium 5.0, Sodium 138, EGFR 23-26  10/05/2016Creatinine 2.29, BUN 49, Potassium 4.8, Sodium 137, EGFR 25-29  Recommendations: No changes.  Encouraged to call for fluid symptoms.  Wife stated he does not always follow low salt diet and he is not taking Torsemide on days of cardiac rehab.  Encouraged patient to take after he arrives home from rehab so he dos not miss doses.  Discussed low sodium diet.   Follow-up plan: ICM clinic phone appointment on 04/15/2017.    Copy of ICM check sent to Dr. Lovena Le.   3 month ICM trend: 03/11/2017   1 Year ICM trend:      Rosalene Billings, RN 03/11/2017 9:01 AM

## 2017-03-12 ENCOUNTER — Encounter (HOSPITAL_COMMUNITY)
Admission: RE | Admit: 2017-03-12 | Discharge: 2017-03-12 | Disposition: A | Discharge status: Home / Self Care | Payer: Medicare Other | Source: Ambulatory Visit | Attending: Cardiology | Admitting: Cardiology

## 2017-03-12 VITALS — Wt 171.7 lb

## 2017-03-12 DIAGNOSIS — E785 Hyperlipidemia, unspecified: Secondary | ICD-10-CM | POA: Diagnosis not present

## 2017-03-12 DIAGNOSIS — I255 Ischemic cardiomyopathy: Secondary | ICD-10-CM | POA: Diagnosis not present

## 2017-03-12 DIAGNOSIS — I5022 Chronic systolic (congestive) heart failure: Secondary | ICD-10-CM

## 2017-03-12 DIAGNOSIS — I130 Hypertensive heart and chronic kidney disease with heart failure and stage 1 through stage 4 chronic kidney disease, or unspecified chronic kidney disease: Secondary | ICD-10-CM | POA: Diagnosis not present

## 2017-03-12 DIAGNOSIS — N183 Chronic kidney disease, stage 3 (moderate): Secondary | ICD-10-CM | POA: Diagnosis not present

## 2017-03-12 DIAGNOSIS — I739 Peripheral vascular disease, unspecified: Secondary | ICD-10-CM | POA: Diagnosis not present

## 2017-03-12 NOTE — Progress Notes (Signed)
Daily Session Note  Patient Details  Name: Marcus [name] MRN: [id number] Date of Birth: 09/06/1928 Referring Provider:     Pulmonary Rehab Walk Test from 02/07/2017 in [place]  Referring Provider  Aundra Dubin      Encounter Date: 03/12/2017  Check In:     Session Check In - 03/12/17 1030      Check-In   Location [facility]   Staff Present Rosebud Poles, RN, BSN;Molly diVincenzo, MS, ACSM RCEP, Exercise Physiologist;Emmer Lillibridge Rollene Rotunda, RN, Roque Cash, RN   Supervising physician immediately available to respond to emergencies [facility] immediately available   Physician(s) Dr. Horris Latino   Medication changes reported     No   Fall or balance concerns reported    No   Tobacco Cessation No Change   Warm-up and Cool-down Performed as group-led instruction   Resistance Training Performed Yes   VAD Patient? No     Pain Assessment   Currently in Pain? No/denies   Multiple Pain Sites No      Capillary Blood Glucose: No results found for this or any previous visit (from the past 24 hour(s)).      Exercise Prescription Changes - 03/12/17 1212      Response to Exercise   Blood Pressure (Admit) 110/64   Blood Pressure (Exercise) 124/76   Blood Pressure (Exit) 108/60   Heart Rate (Admit) 91 bpm   Heart Rate (Exercise) 97 bpm   Heart Rate (Exit) 69 bpm   Oxygen Saturation (Admit) 98 %   Oxygen Saturation (Exercise) 93 %   Oxygen Saturation (Exit) 99 %   Rating of Perceived Exertion (Exercise) 13   Perceived Dyspnea (Exercise) 1   Duration Continue with 45 min of aerobic exercise without signs/symptoms of physical distress.   Intensity THRR unchanged     Resistance Training   Training Prescription Yes   Weight blue bands   Reps 10-15   Time 10 Minutes     Bike   Level 4.5   Minutes 17     NuStep   Level 4   Minutes 17   METs 2.1     Track   Laps 10   Minutes 17      History  Smoking Status  . Former  Smoker  . Quit date: 02/10/1971  Smokeless Tobacco  . Never Used    Comment: quit smoking 38yr ago    Goals Met:  Independence with exercise equipment Improved SOB with ADL's Using PLB without cueing & demonstrates good technique Exercise tolerated well No report of cardiac concerns or symptoms Strength training completed today  Goals Unmet:  Not Applicable  Comments: Service time is from 1030 to 1200   Dr. WRush Farmeris [facility]

## 2017-03-14 ENCOUNTER — Encounter (HOSPITAL_COMMUNITY)
Admission: RE | Admit: 2017-03-14 | Discharge: 2017-03-14 | Disposition: A | Discharge status: Home / Self Care | Payer: Medicare Other | Source: Ambulatory Visit | Attending: Cardiology | Admitting: Cardiology

## 2017-03-14 VITALS — Wt 175.0 lb

## 2017-03-14 DIAGNOSIS — I130 Hypertensive heart and chronic kidney disease with heart failure and stage 1 through stage 4 chronic kidney disease, or unspecified chronic kidney disease: Secondary | ICD-10-CM | POA: Diagnosis not present

## 2017-03-14 DIAGNOSIS — I5022 Chronic systolic (congestive) heart failure: Secondary | ICD-10-CM

## 2017-03-14 DIAGNOSIS — I255 Ischemic cardiomyopathy: Secondary | ICD-10-CM | POA: Diagnosis not present

## 2017-03-14 DIAGNOSIS — N183 Chronic kidney disease, stage 3 (moderate): Secondary | ICD-10-CM | POA: Diagnosis not present

## 2017-03-14 DIAGNOSIS — E785 Hyperlipidemia, unspecified: Secondary | ICD-10-CM | POA: Diagnosis not present

## 2017-03-14 DIAGNOSIS — I739 Peripheral vascular disease, unspecified: Secondary | ICD-10-CM | POA: Diagnosis not present

## 2017-03-14 NOTE — Progress Notes (Signed)
Daily Session Note  Patient Details  Name: CHIN WACHTER MRN: [id number] Date of Birth: [birth year] Referring Provider:     Pulmonary Rehab Walk Test from 02/07/2017 in [place]  Referring Provider  Aundra Dubin      Encounter Date: 03/14/2017  Check In:     Session Check In - 03/14/17 1056      Check-In   [facility]   Staff Present Su Hilt, MS, ACSM RCEP, Exercise Physiologist;Joan Leonia Reeves, RN, BSN;Lisa Hughes, RN;Portia Rollene Rotunda, RN, BSN   Supervising physician immediately available to respond to emergencies [facility] immediately available   Physician(s) Dr. Wendee Beavers   Medication changes reported     No   Fall or balance concerns reported    No   Tobacco Cessation No Change   Warm-up and Cool-down Performed as group-led instruction   Resistance Training Performed Yes   VAD Patient? No     Pain Assessment   Currently in Pain? No/denies   Multiple Pain Sites No      Capillary Blood Glucose: No results found for this or any previous visit (from the past 24 hour(s)).      Exercise Prescription Changes - 03/14/17 1200      Response to Exercise   Blood Pressure (Admit) 94/64   Blood Pressure (Exercise) 120/66   Blood Pressure (Exit) 104/62   Heart Rate (Admit) 89 bpm   Heart Rate (Exercise) 74 bpm   Heart Rate (Exit) 75 bpm   Oxygen Saturation (Admit) 100 %   Oxygen Saturation (Exercise) 98 %   Oxygen Saturation (Exit) 100 %   Rating of Perceived Exertion (Exercise) 11   Perceived Dyspnea (Exercise) 1   Duration Continue with 45 min of aerobic exercise without signs/symptoms of physical distress.   Intensity THRR unchanged     Resistance Training   Training Prescription Yes   Weight blue bands   Reps 10-15   Time 10 Minutes     NuStep   Level 4   Minutes 17   METs 2.5     Track   Laps 8   Minutes 17     Home Exercise Plan   Plans to continue exercise at --  [place]       History  Smoking Status  . Former Smoker  . Quit date: 02/10/1971  Smokeless Tobacco  . Never Used    Comment: quit smoking 15yr ago    Goals Met:  Exercise tolerated well No report of cardiac concerns or symptoms Strength training completed today  Goals Unmet:  Not Applicable  Comments: Service time is from 10:30a to 12:30p    Dr. WRush Farmeris [facility]

## 2017-03-14 NOTE — Progress Notes (Signed)
I have reviewed a Home Exercise Prescription with [name] . Marcus [name] is not currently exercising at home.  Marcus [name] was advised to walk 2-3 days a week for 30-45 minute at [employer].  Osric and I discussed how to progress their exercise prescription.  Marcus [name] stated that their goals were to increase walking endurance, better balance, and increase years of life.  Marcus [name] stated that they understand Marcus exercise prescription.  We reviewed exercise guidelines, target heart rate during exercise, oxygen use, weather, home pulse oximeter, endpoints for exercise, and goals.  [name] is encouraged to come to me with any questions. I will continue to follow up with Marcus [name] to assist them with progression and safety.

## 2017-03-19 ENCOUNTER — Encounter (HOSPITAL_COMMUNITY)
Admission: RE | Admit: 2017-03-19 | Discharge: 2017-03-19 | Disposition: A | Discharge status: Home / Self Care | Payer: Medicare Other | Source: Ambulatory Visit | Attending: Cardiology | Admitting: Cardiology

## 2017-03-19 VITALS — Wt 171.3 lb

## 2017-03-19 DIAGNOSIS — E785 Hyperlipidemia, unspecified: Secondary | ICD-10-CM | POA: Diagnosis not present

## 2017-03-19 DIAGNOSIS — I130 Hypertensive heart and chronic kidney disease with heart failure and stage 1 through stage 4 chronic kidney disease, or unspecified chronic kidney disease: Secondary | ICD-10-CM | POA: Diagnosis not present

## 2017-03-19 DIAGNOSIS — I255 Ischemic cardiomyopathy: Secondary | ICD-10-CM | POA: Diagnosis not present

## 2017-03-19 DIAGNOSIS — N183 Chronic kidney disease, stage 3 (moderate): Secondary | ICD-10-CM | POA: Diagnosis not present

## 2017-03-19 DIAGNOSIS — I5022 Chronic systolic (congestive) heart failure: Secondary | ICD-10-CM

## 2017-03-19 DIAGNOSIS — I739 Peripheral vascular disease, unspecified: Secondary | ICD-10-CM | POA: Diagnosis not present

## 2017-03-19 NOTE — Progress Notes (Signed)
Daily Session Note  Patient Details  Name: Marcus [name] MRN: [id number] Date of Birth: 1929/03/05 Referring Provider:     Pulmonary Rehab Walk Test from 02/07/2017 in [place]  Referring Provider  Aundra Dubin      Encounter Date: 03/19/2017  Check In:     Session Check In - 03/19/17 1008      Check-In   [facility]   Staff Present Rosebud Poles, RN, BSN;Molly diVincenzo, MS, ACSM RCEP, Exercise Physiologist;Yovana Scogin Rollene Rotunda, RN, Roque Cash, RN   Supervising physician immediately available to respond to emergencies [facility] immediately available   Physician(s) Dr. Wendee Beavers   Medication changes reported     No   Fall or balance concerns reported    No   Tobacco Cessation No Change   Warm-up and Cool-down Performed as group-led instruction   Resistance Training Performed Yes   VAD Patient? No     Pain Assessment   Currently in Pain? No/denies   Multiple Pain Sites No      Capillary Blood Glucose: No results found for this or any previous visit (from the past 24 hour(s)).      Exercise Prescription Changes - 03/19/17 1200      Response to Exercise   Blood Pressure (Admit) 100/56   Blood Pressure (Exercise) 140/70   Blood Pressure (Exit) 102/64   Heart Rate (Admit) 93 bpm   Heart Rate (Exercise) 91 bpm   Heart Rate (Exit) 79 bpm   Oxygen Saturation (Admit) 98 %   Oxygen Saturation (Exercise) 97 %   Oxygen Saturation (Exit) 100 %   Rating of Perceived Exertion (Exercise) 14   Perceived Dyspnea (Exercise) 2   Duration Continue with 45 min of aerobic exercise without signs/symptoms of physical distress.   Intensity THRR unchanged     Resistance Training   Training Prescription Yes   Weight blue bands   Reps 10-15   Time 10 Minutes     Bike   Level 4.5   Minutes 17     NuStep   Level 4   Minutes 17   METs 2.4     Track   Laps 7   Minutes 17      History  Smoking Status  . Former  Smoker  . Quit date: 02/10/1971  Smokeless Tobacco  . Never Used    Comment: quit smoking 67yr ago    Goals Met:  Independence with exercise equipment Improved SOB with ADL's Using PLB without cueing & demonstrates good technique Exercise tolerated well No report of cardiac concerns or symptoms Strength training completed today  Goals Unmet:  Not Applicable  Comments: Service time is from 1030 to 1200   Dr. WRush Farmeris Medical Director [facility]

## 2017-03-21 ENCOUNTER — Encounter (HOSPITAL_COMMUNITY)
Admission: RE | Admit: 2017-03-21 | Discharge: 2017-03-21 | Disposition: A | Discharge status: Home / Self Care | Payer: Medicare Other | Source: Ambulatory Visit | Attending: Cardiology | Admitting: Cardiology

## 2017-03-21 VITALS — Wt 174.2 lb

## 2017-03-21 DIAGNOSIS — I5022 Chronic systolic (congestive) heart failure: Secondary | ICD-10-CM | POA: Diagnosis not present

## 2017-03-21 DIAGNOSIS — I2510 Atherosclerotic heart disease of native coronary artery without angina pectoris: Secondary | ICD-10-CM | POA: Diagnosis not present

## 2017-03-21 DIAGNOSIS — I130 Hypertensive heart and chronic kidney disease with heart failure and stage 1 through stage 4 chronic kidney disease, or unspecified chronic kidney disease: Secondary | ICD-10-CM | POA: Insufficient documentation

## 2017-03-21 DIAGNOSIS — I4892 Unspecified atrial flutter: Secondary | ICD-10-CM | POA: Diagnosis not present

## 2017-03-21 DIAGNOSIS — Z87891 Personal history of nicotine dependence: Secondary | ICD-10-CM | POA: Insufficient documentation

## 2017-03-21 DIAGNOSIS — N183 Chronic kidney disease, stage 3 (moderate): Secondary | ICD-10-CM | POA: Diagnosis not present

## 2017-03-21 DIAGNOSIS — Z79899 Other long term (current) drug therapy: Secondary | ICD-10-CM | POA: Diagnosis not present

## 2017-03-21 DIAGNOSIS — Z7901 Long term (current) use of anticoagulants: Secondary | ICD-10-CM | POA: Diagnosis not present

## 2017-03-21 DIAGNOSIS — I739 Peripheral vascular disease, unspecified: Secondary | ICD-10-CM | POA: Insufficient documentation

## 2017-03-21 DIAGNOSIS — I255 Ischemic cardiomyopathy: Secondary | ICD-10-CM | POA: Diagnosis not present

## 2017-03-21 DIAGNOSIS — E785 Hyperlipidemia, unspecified: Secondary | ICD-10-CM | POA: Diagnosis not present

## 2017-03-21 DIAGNOSIS — Z951 Presence of aortocoronary bypass graft: Secondary | ICD-10-CM | POA: Insufficient documentation

## 2017-03-21 DIAGNOSIS — Z950 Presence of cardiac pacemaker: Secondary | ICD-10-CM | POA: Diagnosis not present

## 2017-03-21 NOTE — Progress Notes (Signed)
Daily Session Note  Patient Details  Name: Marcus [name] MRN: [id number] Date of Birth: 1928/08/09 Referring Provider:     Pulmonary Rehab Walk Test from 02/07/2017 in [place]  Referring Provider  Aundra Dubin      Encounter Date: 03/21/2017  Check In:     Session Check In - 03/21/17 1006      Check-In   [facility]   Staff Present Rosebud Poles, RN, BSN;Molly diVincenzo, MS, ACSM RCEP, Exercise Physiologist;Portia Rollene Rotunda, RN, Roque Cash, RN   Supervising physician immediately available to respond to emergencies [facility] immediately available   Physician(s) Dr. Broadus John   Medication changes reported     No   Fall or balance concerns reported    No   Tobacco Cessation No Change   Warm-up and Cool-down Performed as group-led instruction   Resistance Training Performed Yes   VAD Patient? No     Pain Assessment   Currently in Pain? No/denies   Multiple Pain Sites No      Capillary Blood Glucose: No results found for this or any previous visit (from the past 24 hour(s)).      Exercise Prescription Changes - 03/21/17 1200      Response to Exercise   Blood Pressure (Admit) 104/60   Blood Pressure (Exercise) 128/72   Blood Pressure (Exit) 104/64   Heart Rate (Admit) 82 bpm   Heart Rate (Exercise) 89 bpm   Heart Rate (Exit) 70 bpm   Oxygen Saturation (Admit) 99 %   Oxygen Saturation (Exercise) 99 %   Oxygen Saturation (Exit) 99 %   Rating of Perceived Exertion (Exercise) 15   Perceived Dyspnea (Exercise) 1   Duration Continue with 45 min of aerobic exercise without signs/symptoms of physical distress.   Intensity THRR unchanged     Resistance Training   Training Prescription Yes   Weight blue bands   Reps 10-15   Time 10 Minutes     Bike   Level 4.5   Minutes 17     Track   Laps 11   Minutes 17      History  Smoking Status  . Former Smoker  . Quit date: 02/10/1971  Smokeless Tobacco   . Never Used    Comment: quit smoking 19yr ago    Goals Met:  Proper associated with RPD/PD & O2 Sat Strength training completed today  Goals Unmet:  Not Applicable  Comments: Service time is from 1030 to 149   Dr. WRush Farmeris [facility]

## 2017-03-26 ENCOUNTER — Encounter (HOSPITAL_COMMUNITY)
Admission: RE | Admit: 2017-03-26 | Discharge: 2017-03-26 | Disposition: A | Discharge status: Home / Self Care | Payer: Medicare Other | Source: Ambulatory Visit | Attending: Internal Medicine | Admitting: Internal Medicine

## 2017-03-26 VITALS — Wt 172.2 lb

## 2017-03-26 DIAGNOSIS — I5022 Chronic systolic (congestive) heart failure: Secondary | ICD-10-CM

## 2017-03-26 DIAGNOSIS — I255 Ischemic cardiomyopathy: Secondary | ICD-10-CM | POA: Diagnosis not present

## 2017-03-26 DIAGNOSIS — I130 Hypertensive heart and chronic kidney disease with heart failure and stage 1 through stage 4 chronic kidney disease, or unspecified chronic kidney disease: Secondary | ICD-10-CM | POA: Diagnosis not present

## 2017-03-26 DIAGNOSIS — E785 Hyperlipidemia, unspecified: Secondary | ICD-10-CM | POA: Diagnosis not present

## 2017-03-26 DIAGNOSIS — I739 Peripheral vascular disease, unspecified: Secondary | ICD-10-CM | POA: Diagnosis not present

## 2017-03-26 DIAGNOSIS — N183 Chronic kidney disease, stage 3 (moderate): Secondary | ICD-10-CM | POA: Diagnosis not present

## 2017-03-26 NOTE — Progress Notes (Signed)
Daily Session Note  Patient Details  Name: Marcus [name] MRN: [id number] Date of Birth: 11-17-1928 Referring Provider:     Pulmonary Rehab Walk Test from 02/07/2017 in [place]  Referring Provider  Aundra Dubin      Encounter Date: 03/26/2017  Check In: Session Check In - 03/26/17 1017      Check-In   Location  [facility]    Staff Present  Su Hilt, MS, ACSM RCEP, Exercise Physiologist;Lisa Ysidro Evert, RN;Portia Rincon, RN, Maxcine Ham, RN, BSN    Supervising physician immediately available to respond to emergencies  [facility] immediately available    Physician(s)  Dr. Broadus John    Medication changes reported      No    Fall or balance concerns reported     No    Tobacco Cessation  No Change    Warm-up and Cool-down  Performed as group-led instruction    Resistance Training Performed  Yes    VAD Patient?  No      Pain Assessment   Currently in Pain?  No/denies    Multiple Pain Sites  No       Capillary Blood Glucose: No results found for this or any previous visit (from the past 24 hour(s)).  Exercise Prescription Changes - 03/26/17 1200      Response to Exercise   Blood Pressure (Admit)  104/60    Blood Pressure (Exercise)  118/63    Blood Pressure (Exit)  102/60    Heart Rate (Admit)  83 bpm    Heart Rate (Exercise)  90 bpm    Heart Rate (Exit)  70 bpm    Oxygen Saturation (Admit)  99 %    Oxygen Saturation (Exercise)  98 %    Oxygen Saturation (Exit)  99 %    Rating of Perceived Exertion (Exercise)  12    Perceived Dyspnea (Exercise)  1    Duration  Continue with 45 min of aerobic exercise without signs/symptoms of physical distress.    Intensity  THRR unchanged      Resistance Training   Training Prescription  Yes    Weight  blue bands    Reps  10-15    Time  10 Minutes      Bike   Level  4.7    Minutes  17      NuStep   Level  4    Minutes  17    METs  2.8      Track   Laps  13    Minutes  17       Social History   Tobacco Use  Smoking Status Former Smoker  . Last attempt to quit: 02/10/1971  . Years since quitting: 46.1  Smokeless Tobacco Never Used  Tobacco Comment   quit smoking 5yr ago    Goals Met:  Exercise tolerated well Strength training completed today  Goals Unmet:  Not Applicable  Comments: Service time is from 1030 to 1200    Dr. WRush Farmeris [facility]

## 2017-03-28 ENCOUNTER — Encounter (HOSPITAL_COMMUNITY)
Admission: RE | Admit: 2017-03-28 | Discharge: 2017-03-28 | Disposition: A | Discharge status: Home / Self Care | Payer: Medicare Other | Source: Ambulatory Visit | Attending: Cardiology | Admitting: Cardiology

## 2017-03-28 VITALS — Wt 173.1 lb

## 2017-03-28 DIAGNOSIS — N183 Chronic kidney disease, stage 3 (moderate): Secondary | ICD-10-CM | POA: Diagnosis not present

## 2017-03-28 DIAGNOSIS — I5022 Chronic systolic (congestive) heart failure: Secondary | ICD-10-CM | POA: Diagnosis not present

## 2017-03-28 DIAGNOSIS — I255 Ischemic cardiomyopathy: Secondary | ICD-10-CM | POA: Diagnosis not present

## 2017-03-28 DIAGNOSIS — I739 Peripheral vascular disease, unspecified: Secondary | ICD-10-CM | POA: Diagnosis not present

## 2017-03-28 DIAGNOSIS — E785 Hyperlipidemia, unspecified: Secondary | ICD-10-CM | POA: Diagnosis not present

## 2017-03-28 DIAGNOSIS — I130 Hypertensive heart and chronic kidney disease with heart failure and stage 1 through stage 4 chronic kidney disease, or unspecified chronic kidney disease: Secondary | ICD-10-CM | POA: Diagnosis not present

## 2017-03-28 NOTE — Progress Notes (Signed)
Daily Session Note  Patient Details  Name: Marcus [name] MRN: [id number] Date of Birth: 04/19/1929 Referring Provider:     Pulmonary Rehab Walk Test from 02/07/2017 in [place]  Referring Provider  Aundra Dubin      Encounter Date: 03/28/2017  Check In: Session Check In - 03/28/17 1312      Check-In   Location  [facility]       Capillary Blood Glucose: No results found for this or any previous visit (from the past 24 hour(s)).  Exercise Prescription Changes - 03/28/17 1300      Response to Exercise   Blood Pressure (Admit)  126/70    Blood Pressure (Exercise)  130/70    Blood Pressure (Exit)  100/60    Heart Rate (Admit)  89 bpm    Heart Rate (Exercise)  85 bpm    Heart Rate (Exit)  65 bpm    Oxygen Saturation (Admit)  96 %    Oxygen Saturation (Exercise)  93 %    Oxygen Saturation (Exit)  99 %    Rating of Perceived Exertion (Exercise)  11    Perceived Dyspnea (Exercise)  0    Duration  Continue with 45 min of aerobic exercise without signs/symptoms of physical distress.    Intensity  THRR unchanged      Resistance Training   Training Prescription  Yes    Weight  blue bands    Reps  10-15    Time  10 Minutes      NuStep   Level  4    Minutes  17    METs  2.8      Track   Laps  10    Minutes  17       Social History   Tobacco Use  Smoking Status Former Smoker  . Last attempt to quit: 02/10/1971  . Years since quitting: 46.1  Smokeless Tobacco Never Used  Tobacco Comment   quit smoking 43yr ago    Goals Met:  Exercise tolerated well No report of cardiac concerns or symptoms Strength training completed today  Goals Unmet:  Not Applicable  Comments: Service time is from 10:30a to 12:30p    Dr. WRush Farmeris [facility]

## 2017-04-02 ENCOUNTER — Encounter (HOSPITAL_COMMUNITY)
Admission: RE | Admit: 2017-04-02 | Discharge: 2017-04-02 | Disposition: A | Discharge status: Home / Self Care | Payer: Medicare Other | Source: Ambulatory Visit | Attending: Cardiology | Admitting: Cardiology

## 2017-04-02 VITALS — Wt 172.8 lb

## 2017-04-02 DIAGNOSIS — N183 Chronic kidney disease, stage 3 (moderate): Secondary | ICD-10-CM | POA: Diagnosis not present

## 2017-04-02 DIAGNOSIS — E785 Hyperlipidemia, unspecified: Secondary | ICD-10-CM | POA: Diagnosis not present

## 2017-04-02 DIAGNOSIS — I739 Peripheral vascular disease, unspecified: Secondary | ICD-10-CM | POA: Diagnosis not present

## 2017-04-02 DIAGNOSIS — I130 Hypertensive heart and chronic kidney disease with heart failure and stage 1 through stage 4 chronic kidney disease, or unspecified chronic kidney disease: Secondary | ICD-10-CM | POA: Diagnosis not present

## 2017-04-02 DIAGNOSIS — I5022 Chronic systolic (congestive) heart failure: Secondary | ICD-10-CM

## 2017-04-02 DIAGNOSIS — I255 Ischemic cardiomyopathy: Secondary | ICD-10-CM | POA: Diagnosis not present

## 2017-04-02 NOTE — Progress Notes (Signed)
Daily Session Note  Patient Details  Name: Marcus [name] MRN: [id number] Date of Birth: 01-27-1929 Referring Provider:     Pulmonary Rehab Walk Test from 02/07/2017 in [place]  Referring Provider  Aundra Dubin      Encounter Date: 04/02/2017  Check In: Session Check In - 04/02/17 1236      Check-In   Location  [facility]    Staff Present  Trish Fountain, RN, BSN;Lisa Ysidro Evert, RN;Molly diVincenzo, MS, ACSM RCEP, Exercise Physiologist;Lekeshia Kram Leonia Reeves, RN, BSN    Supervising physician immediately available to respond to emergencies  [facility] immediately available    Physician(s)  Dr. Maylene Roes    Medication changes reported      No    Fall or balance concerns reported     No    Tobacco Cessation  No Change    Warm-up and Cool-down  Performed as group-led instruction    Resistance Training Performed  Yes    VAD Patient?  No      Pain Assessment   Currently in Pain?  No/denies    Multiple Pain Sites  No       Capillary Blood Glucose: No results found for this or any previous visit (from the past 24 hour(s)).  Exercise Prescription Changes - 04/02/17 1200      Response to Exercise   Blood Pressure (Admit)  100/58    Blood Pressure (Exercise)  124/64    Blood Pressure (Exit)  98/60    Heart Rate (Admit)  74 bpm    Heart Rate (Exercise)  79 bpm    Heart Rate (Exit)  71 bpm    Oxygen Saturation (Admit)  99 %    Oxygen Saturation (Exercise)  100 %    Oxygen Saturation (Exit)  99 %    Rating of Perceived Exertion (Exercise)  11    Perceived Dyspnea (Exercise)  0    Duration  Continue with 45 min of aerobic exercise without signs/symptoms of physical distress.    Intensity  THRR unchanged      Resistance Training   Training Prescription  Yes    Weight  blue bands    Reps  10-15    Time  10 Minutes      Bike   Level  4.7    Minutes  17      NuStep   Level  4    Minutes  17    METs  2.2      Track   Laps  16    Minutes  17       Social History   Tobacco Use  Smoking Status Former Smoker  . Last attempt to quit: 02/10/1971  . Years since quitting: 46.1  Smokeless Tobacco Never Used  Tobacco Comment   quit smoking 76yr ago    Goals Met:  Exercise tolerated well Strength training completed today  Goals Unmet:  Not Applicable  Comments: Service time is from 1030 to 1215    Dr. WRush Farmeris [facility]

## 2017-04-04 ENCOUNTER — Encounter (HOSPITAL_COMMUNITY)
Admission: RE | Admit: 2017-04-04 | Discharge: 2017-04-04 | Disposition: A | Discharge status: Home / Self Care | Payer: Medicare Other | Source: Ambulatory Visit | Attending: Cardiology | Admitting: Cardiology

## 2017-04-04 DIAGNOSIS — E785 Hyperlipidemia, unspecified: Secondary | ICD-10-CM | POA: Diagnosis not present

## 2017-04-04 DIAGNOSIS — I255 Ischemic cardiomyopathy: Secondary | ICD-10-CM | POA: Diagnosis not present

## 2017-04-04 DIAGNOSIS — N183 Chronic kidney disease, stage 3 (moderate): Secondary | ICD-10-CM | POA: Diagnosis not present

## 2017-04-04 DIAGNOSIS — I739 Peripheral vascular disease, unspecified: Secondary | ICD-10-CM | POA: Diagnosis not present

## 2017-04-04 DIAGNOSIS — I5022 Chronic systolic (congestive) heart failure: Secondary | ICD-10-CM

## 2017-04-04 DIAGNOSIS — I130 Hypertensive heart and chronic kidney disease with heart failure and stage 1 through stage 4 chronic kidney disease, or unspecified chronic kidney disease: Secondary | ICD-10-CM | POA: Diagnosis not present

## 2017-04-04 NOTE — Progress Notes (Signed)
Daily Session Note  Patient Details  Name: Marcus [name] MRN: [id number] Date of Birth: 04-26-1929 Referring Provider:     Pulmonary Rehab Walk Test from 02/07/2017 in [facility]  Referring Provider  Aundra Dubin      Encounter Date: 04/04/2017  Check In: Session Check In - 04/04/17 1030      Check-In   Location  [facility]    Staff Present  Rosebud Poles, RN, BSN;Inari Shin, MS, ACSM RCEP, Exercise Physiologist;Lisa Ysidro Evert, RN;Portia Rollene Rotunda, RN, BSN    Supervising physician immediately available to respond to emergencies  [facility] immediately available    Physician(s)  Dr. Broadus John    Medication changes reported      No    Fall or balance concerns reported     No    Tobacco Cessation  No Change    Warm-up and Cool-down  Performed as group-led instruction    Resistance Training Performed  Yes    VAD Patient?  No      Pain Assessment   Currently in Pain?  No/denies    Multiple Pain Sites  No       Capillary Blood Glucose: No results found for this or any previous visit (from the past 24 hour(s)).  Exercise Prescription Changes - 04/04/17 1200      Response to Exercise   Blood Pressure (Admit)  98/50    Blood Pressure (Exercise)  112/64    Blood Pressure (Exit)  104/60    Heart Rate (Admit)  74 bpm    Heart Rate (Exercise)  92 bpm    Heart Rate (Exit)  71 bpm    Oxygen Saturation (Admit)  98 %    Oxygen Saturation (Exercise)  99 %    Oxygen Saturation (Exit)  99 %    Rating of Perceived Exertion (Exercise)  10    Perceived Dyspnea (Exercise)  0    Duration  Continue with 45 min of aerobic exercise without signs/symptoms of physical distress.    Intensity  THRR unchanged      Resistance Training   Training Prescription  Yes    Weight  blue bands    Reps  10-15    Time  10 Minutes      Bike   Level  4.7    Minutes  17      NuStep   Level  4    Minutes  17    METs  1.9       Social History    Tobacco Use  Smoking Status Former Smoker  . Last attempt to quit: 02/10/1971  . Years since quitting: 46.1  Smokeless Tobacco Never Used  Tobacco Comment   quit smoking 74yr ago    Goals Met:  Exercise tolerated well No report of cardiac concerns or symptoms Strength training completed today  Goals Unmet:  Not Applicable  Comments: Service time is from 10:30a to 12:30p    Dr. WRush Farmeris Medical Director [facility]

## 2017-04-04 NOTE — Progress Notes (Signed)
Pulmonary Individual Treatment Plan  Patient Details  Name: Marcus [name] MRN: [id number] Date of Birth: [birth year] Referring Provider:     Pulmonary Rehab Walk Test from 02/07/2017 in [place]  Referring Provider  Aundra Dubin      Initial Encounter Date:    Pulmonary Rehab Walk Test from 02/07/2017 in [place]  Date  02/12/17  Referring Provider  Aundra Dubin      Visit Diagnosis: No diagnosis found.  Patient's Home Medications on Admission:   Current Outpatient Medications:  .  amiodarone (PACERONE) 200 MG tablet, Take 0.5 tablets (100 mg total) by mouth daily., Disp: 45 tablet, Rfl: 3 .  apixaban (ELIQUIS) 2.5 MG TABS tablet, Take 1 tablet (2.5 mg total) by mouth 2 (two) times daily., Disp: 180 tablet, Rfl: 3 .  carvedilol (COREG) 6.25 MG tablet, Take 1 tablet (6.25 mg total) by mouth 2 (two) times daily with a meal., Disp: 180 tablet, Rfl: 2 .  Coenzyme Q10 (CO Q 10) 100 MG CAPS, Take 1 capsule by mouth 2 (two) times daily., Disp: , Rfl:  .  GLUTATHIONE PO, Take by mouth daily. Take as directed, Disp: , Rfl:  .  isosorbide mononitrate (IMDUR) 30 MG 24 hr tablet, Take 0.5 tablets (15 mg total) by mouth daily., Disp: 45 tablet, Rfl: 3 .  Multiple Vitamins-Minerals (MULTIVITAMINS/MINERALS ADULT PO), Take by mouth daily. MacuGuard, Disp: , Rfl:  .  Multiple Vitamins-Minerals (PRESERVISION AREDS 2 PO), Take 1 capsule by mouth 2 (two) times daily., Disp: , Rfl:  .  spironolactone (ALDACTONE) 25 MG tablet, TAKE ONE-HALF TABLET BY MOUTH ONCE DAILY, Disp: 45 tablet, Rfl: 3 .  torsemide (DEMADEX) 20 MG tablet, Take 40 mg (2 tabs) one day alternating with 20 mg (1 tab) the next day continuously. (Patient taking differently: Take 40 mg (2 tabs) one day alternating with 20 mg (1 tab) the next day continuously.  Wife reported script was changed to 20 mg daily.), Disp: 45 tablet, Rfl: 6  Past Medical History: Past Medical History:   Diagnosis Date  . Atrial flutter ([place])    a. Dx 07/2014, s/p TEE/DCCV.  . Cataracts, bilateral   . Chronic systolic CHF (congestive heart failure) ([place])    a. s/p CRT-D 09/2013. b. Device explant with temp perm then subsequent CRT-P 07/2014.  [place] Kitchen CKD (chronic kidney disease), stage III   . Coronary artery disease    a. s/p remote CABG x 2(VG->OM, LIMA->LAD;  b. Late 90's s/p PCI of RCA;  c. 12/09 Cath/PCI: LM 5m, 70-80d (3.0x57mm Xience DES), LAD100p, LCX 80-90p (2.25x23mmTaxus Atom DES), RCA 100d (2.5x21mm Xience DES), VG->OM 100, LIMA->LAD nl, EF 30-35%.  Marland Kitchen DVT (deep venous thrombosis) ([place]) 2017   Right knee  . Hard of hearing   . History of colon polyps   . History of gastric ulcer   . History of gout   . History of shingles   . Hyperlipidemia   . Hypertension   . Ischemic cardiomyopathy    a. EF 35-40% by echo 05/2012, b. EF 20-25%, akinesis and scarring of inferolateral, inferior and inferoseptal myocardium, mild AI, mod MR, LA mod dilated, RV mildly dialted and sys fx mildly reduced, RA mildly dilated (09/2013)  c. EF 35-40% by 2D in 07/2014, 25-30% by TEE.  . Macular degeneration, dry   . Pacemaker infection ([place])   . PAF (paroxysmal atrial fibrillation) ([place])   . Peripheral vascular disease ([place])   . Presence of permanent  cardiac pacemaker 07/23/2014   [facility] [phone], Serial [id number]  . Prostate cancer ([place])   . Symptomatic bradycardia    a. 08/2003 s/p MDT Enpulse [id number] Dual chamber PPM ser # [id number] H. b. Device explant due to infection/temp perm insertion/PPM 07/2014.    Tobacco Use: Social History   Tobacco Use  Smoking Status Former Smoker  . Last attempt to quit: 02/10/1971  . Years since quitting: 46.1  Smokeless Tobacco Never Used  Tobacco Comment   quit smoking 20yrs ago    Labs: Recent Review Flowsheet Data    Labs for ITP Cardiac and Pulmonary Rehab Latest Ref Rng & Units 07/20/2014 07/21/2014 07/22/2014 03/01/2016 03/01/2016   Cholestrol 0 - 200  mg/dL - - - - -   LDLCALC 0 - 99 mg/dL - - - - -   HDL >39.00 mg/dL - - - - -   Trlycerides 0.0 - 149.0 mg/dL - - - - -   HCO3 20.0 - 28.0 mmol/L - - - 19.1(L) 18.8(L)   TCO2 0 - 100 mmol/L - - - 20 20   ACIDBASEDEF 0.0 - 2.0 mmol/L - - - 8.0(H) 8.0(H)   O2SAT % 68.7 82.3 68.4 58.0 61.0      Capillary Blood Glucose: Lab Results  Component Value Date   GLUCAP 107 (H) 07/24/2014     Pulmonary Assessment Scores: Pulmonary Assessment Scores    Row Name 02/12/17 0723 02/13/17 1407       ADL UCSD   ADL Phase  Entry  Entry    SOB Score total  -  30      CAT Score   CAT Score  -  13 Entry      mMRC Score   mMRC Score  1  -       Pulmonary Function Assessment: Pulmonary Function Assessment - 02/01/17 1312      Breath   Bilateral Breath Sounds  Clear    Shortness of Breath  Yes;Limiting activity       Exercise Target Goals:    Exercise Program Goal: Individual exercise prescription set with THRR, safety & activity barriers. Participant demonstrates ability to understand and report RPE using BORG scale, to self-measure pulse accurately, and to acknowledge the importance of the exercise prescription.  Exercise Prescription Goal: Starting with aerobic activity 30 plus minutes a day, 3 days per week for initial exercise prescription. Provide home exercise prescription and guidelines that participant acknowledges understanding prior to discharge.  Activity Barriers & Risk Stratification: Activity Barriers & Cardiac Risk Stratification - 02/01/17 1255      Activity Barriers & Cardiac Risk Stratification   Activity Barriers  Decreased Ventricular Function;Balance Concerns       6 Minute Walk: 6 Minute Walk    Row Name 02/12/17 0719         6 Minute Walk   Phase  Initial     Distance  1220 feet     Walk Time  6 minutes     # of Rest Breaks  0     MPH  2.31     METS  2.76     RPE  12     Perceived Dyspnea   1     Symptoms  No     Resting HR  74 bpm      Resting BP  126/76     Resting Oxygen Saturation   95 %     Exercise Oxygen Saturation  during 6 min walk  100 %     Max Ex. HR  97 bpm     Max Ex. BP  133/77       Interval HR   1 Minute HR  78     2 Minute HR  91     3 Minute HR  75     4 Minute HR  96     5 Minute HR  97     6 Minute HR  95     2 Minute Post HR  81     Interval Heart Rate?  Yes       Interval Oxygen   Interval Oxygen?  Yes     Baseline Oxygen Saturation %  95 %     1 Minute Oxygen Saturation %  100 %     1 Minute Liters of Oxygen  0 L     2 Minute Oxygen Saturation %  100 %     2 Minute Liters of Oxygen  0 L     3 Minute Oxygen Saturation %  100 %     3 Minute Liters of Oxygen  0 L     4 Minute Oxygen Saturation %  100 %     4 Minute Liters of Oxygen  0 L     5 Minute Oxygen Saturation %  100 %     5 Minute Liters of Oxygen  0 L     6 Minute Oxygen Saturation %  100 %     6 Minute Liters of Oxygen  0 L     2 Minute Post Oxygen Saturation %  100 %     2 Minute Post Liters of Oxygen  0 L        Oxygen Initial Assessment: Oxygen Initial Assessment - 02/12/17 0723      Initial 6 min Walk   Oxygen Used  None      Program Oxygen Prescription   Program Oxygen Prescription  None       Oxygen Re-Evaluation: Oxygen Re-Evaluation    Row Name 03/10/17 2055 04/01/17 1330           Program Oxygen Prescription   Program Oxygen Prescription  None  None        Home Oxygen   Home Oxygen Device  None  None      Sleep Oxygen Prescription  None  None      Home Exercise Oxygen Prescription  None  None      Home at Rest Exercise Oxygen Prescription  None  None         Oxygen Discharge (Final Oxygen Re-Evaluation): Oxygen Re-Evaluation - 04/01/17 1330      Program Oxygen Prescription   Program Oxygen Prescription  None      Home Oxygen   Home Oxygen Device  None    Sleep Oxygen Prescription  None    Home Exercise Oxygen Prescription  None    Home at Rest Exercise Oxygen Prescription  None        Initial Exercise Prescription: Initial Exercise Prescription - 02/12/17 0700      Date of Initial Exercise RX and Referring Provider   Date  02/12/17    Referring Provider  Aundra Dubin      Bike   Level  2 scitfit    Minutes  17      NuStep   Level  2    Minutes  17    METs  1.5  Track   Laps  10    Minutes  17      Prescription Details   Frequency (times per week)  2    Duration  Progress to 45 minutes of aerobic exercise without signs/symptoms of physical distress      Intensity   THRR 40-80% of Max Heartrate  54-108    Ratings of Perceived Exertion  11-13    Perceived Dyspnea  0-4      Progression   Progression  Continue progressive overload as per policy without signs/symptoms or physical distress.      Resistance Training   Training Prescription  Yes    Weight  blue bands    Reps  10-15       Perform Capillary Blood Glucose checks as needed.  Exercise Prescription Changes: Exercise Prescription Changes    Row Name 02/12/17 1200 02/14/17 1200 02/19/17 1200 02/21/17 1200 02/26/17 1300     Response to Exercise   Blood Pressure (Admit)  100/62  108/66  104/60  118/68  110/60   Blood Pressure (Exercise)  138/74  128/66  122/60  120/76  128/78   Blood Pressure (Exit)  102/64  104/72  124/76  120/60  118/70   Heart Rate (Admit)  86 bpm  95 bpm  88 bpm  81 bpm  92 bpm   Heart Rate (Exercise)  102 bpm  87 bpm  94 bpm  94 bpm  95 bpm   Heart Rate (Exit)  80 bpm  70 bpm  74 bpm  69 bpm  75 bpm   Oxygen Saturation (Admit)  100 %  100 %  100 %  100 %  100 %   Oxygen Saturation (Exercise)  98 %  100 %  100 %  97 %  100 %   Oxygen Saturation (Exit)  100 %  99 %  100 %  97 %  100 %   Rating of Perceived Exertion (Exercise)  11  12  13  12  11    Perceived Dyspnea (Exercise)  1  1  2  1  2    Duration  Continue with 45 min of aerobic exercise without signs/symptoms of physical distress.  Continue with 45 min of aerobic exercise without signs/symptoms of physical  distress.  Continue with 45 min of aerobic exercise without signs/symptoms of physical distress.  Continue with 45 min of aerobic exercise without signs/symptoms of physical distress.  Continue with 45 min of aerobic exercise without signs/symptoms of physical distress.   Intensity  THRR unchanged  THRR unchanged  THRR unchanged  THRR unchanged  THRR unchanged     Resistance Training   Training Prescription  Yes  Yes  Yes  Yes  Yes   Weight  blue bands  blue bands  blue bands  blue bands  blue bands   Reps  10-15  10-15  10-15  10-15  10-15   Time  -  -  -  -  10 Minutes     Bike   Level  2 scitfit  -  2  3  3    Minutes  17  -  17  17  17      NuStep   Level  2  2  2  3  3    Minutes  17  17  17  17  17    METs  2  2.2  2.2  2  2      Track   Laps  11  10  10  -  -   Minutes  17  17  17   -  -   Row Name 02/28/17 1200 03/05/17 1200 03/07/17 1200 03/12/17 1212 03/14/17 1200     Response to Exercise   Blood Pressure (Admit)  100/56  106/66  114/64  110/64  94/64   Blood Pressure (Exercise)  100/62  118/70  118/70  124/76  120/66   Blood Pressure (Exit)  110/60  102/70  114/64  108/60  104/62   Heart Rate (Admit)  76 bpm  76 bpm  78 bpm  91 bpm  89 bpm   Heart Rate (Exercise)  80 bpm  88 bpm  86 bpm  97 bpm  74 bpm   Heart Rate (Exit)  70 bpm  70 bpm  73 bpm  69 bpm  75 bpm   Oxygen Saturation (Admit)  98 %  99 %  100 %  98 %  100 %   Oxygen Saturation (Exercise)  100 %  97 %  100 %  93 %  98 %   Oxygen Saturation (Exit)  100 %  100 %  100 %  99 %  100 %   Rating of Perceived Exertion (Exercise)  13  13  13  13  11    Perceived Dyspnea (Exercise)  1  2  2  1  1    Duration  Continue with 45 min of aerobic exercise without signs/symptoms of physical distress.  Continue with 45 min of aerobic exercise without signs/symptoms of physical distress.  Continue with 45 min of aerobic exercise without signs/symptoms of physical distress.  Continue with 45 min of aerobic exercise without  signs/symptoms of physical distress.  Continue with 45 min of aerobic exercise without signs/symptoms of physical distress.   Intensity  THRR unchanged  THRR unchanged  THRR unchanged  THRR unchanged  THRR unchanged     Resistance Training   Training Prescription  Yes  Yes  Yes  Yes  Yes   Weight  blue bands  blue bands  blue bands  blue bands  blue bands   Reps  10-15  10-15  10-15  10-15  10-15   Time  10 Minutes  10 Minutes  10 Minutes  10 Minutes  10 Minutes     Bike   Level  2  4  4   4.5  -   Minutes  17  17  17  17   -     NuStep   Level  3  4  -  4  4   Minutes  17  17  -  17  17   METs  1.5  2.7  -  2.1  2.5     Track   Laps  10  9  9  10  8    Minutes  17  17  17  17  17      Home Exercise Plan   Plans to continue exercise at  -  -  -  [employer] (comment) [place]   Frequency  -  -  -  Add 2 additional days to program exercise sessions.  -   Row Name 03/19/17 1200 03/21/17 1200 03/26/17 1200 03/28/17 1300 04/02/17 1200     Response to Exercise   Blood Pressure (Admit)  100/56  104/60  104/60  126/70  100/58   Blood Pressure (Exercise)  140/70  128/72  118/63  130/70  124/64   Blood Pressure (Exit)  102/64  104/64  102/60  100/60  98/60   Heart Rate (Admit)  93 bpm  82 bpm  83 bpm  89 bpm  74 bpm   Heart Rate (Exercise)  91 bpm  89 bpm  90 bpm  85 bpm  79 bpm   Heart Rate (Exit)  79 bpm  70 bpm  70 bpm  65 bpm  71 bpm   Oxygen Saturation (Admit)  98 %  99 %  99 %  96 %  99 %   Oxygen Saturation (Exercise)  97 %  99 %  98 %  93 %  100 %   Oxygen Saturation (Exit)  100 %  99 %  99 %  99 %  99 %   Rating of Perceived Exertion (Exercise)  14  15  12  11  11    Perceived Dyspnea (Exercise)  2  1  1   0  0   Duration  Continue with 45 min of aerobic exercise without signs/symptoms of physical distress.  Continue with 45 min of aerobic exercise without signs/symptoms of physical distress.  Continue with 45 min of aerobic exercise without  signs/symptoms of physical distress.  Continue with 45 min of aerobic exercise without signs/symptoms of physical distress.  Continue with 45 min of aerobic exercise without signs/symptoms of physical distress.   Intensity  THRR unchanged  THRR unchanged  THRR unchanged  THRR unchanged  THRR unchanged     Resistance Training   Training Prescription  Yes  Yes  Yes  Yes  Yes   Weight  blue bands  blue bands  blue bands  blue bands  blue bands   Reps  10-15  10-15  10-15  10-15  10-15   Time  10 Minutes  10 Minutes  10 Minutes  10 Minutes  10 Minutes     Bike   Level  4.5  4.5  4.7  -  4.7   Minutes  17  17  17   -  17     NuStep   Level  4  -  4  4  4    Minutes  17  -  17  17  17    METs  2.4  -  2.8  2.8  2.2     Track   Laps  7  11  13  10  16    Minutes  17  17  17  17  17       Exercise Comments: Exercise Comments    Row Name 03/14/17 0720           Exercise Comments  Home exercise completed          Exercise Goals and Review: Exercise Goals    Row Name 02/01/17 1256             Exercise Goals   Increase Physical Activity  Yes       Intervention  Provide advice, education, support and counseling about physical activity/exercise needs.;Develop an individualized exercise prescription for aerobic and resistive training based on initial evaluation findings, risk stratification, comorbidities and participant's personal goals.       Expected Outcomes  Achievement of increased cardiorespiratory fitness and enhanced flexibility, muscular endurance and strength shown through measurements of functional capacity and personal statement of participant.       Increase Strength and Stamina  Yes       Intervention  Provide advice, education, support  and counseling about physical activity/exercise needs.;Develop an individualized exercise prescription for aerobic and resistive training based on initial evaluation findings, risk stratification, comorbidities and participant's personal  goals.       Expected Outcomes  Achievement of increased cardiorespiratory fitness and enhanced flexibility, muscular endurance and strength shown through measurements of functional capacity and personal statement of participant.       Able to understand and use rate of perceived exertion (RPE) scale  Yes       Intervention  Provide education and explanation on how to use RPE scale       Expected Outcomes  Short Term: Able to use RPE daily in rehab to express subjective intensity level;Long Term:  Able to use RPE to guide intensity level when exercising independently       Able to understand and use Dyspnea scale  Yes       Intervention  Provide education and explanation on how to use Dyspnea scale       Expected Outcomes  Short Term: Able to use Dyspnea scale daily in rehab to express subjective sense of shortness of breath during exertion;Long Term: Able to use Dyspnea scale to guide intensity level when exercising independently       Knowledge and understanding of Target Heart Rate Range (THRR)  Yes       Intervention  Provide education and explanation of THRR including how the numbers were predicted and where they are located for reference       Expected Outcomes  Short Term: Able to state/look up THRR;Long Term: Able to use THRR to govern intensity when exercising independently;Short Term: Able to use daily as guideline for intensity in rehab       Understanding of Exercise Prescription  Yes       Intervention  Provide education, explanation, and written materials on patient's individual exercise prescription       Expected Outcomes  Short Term: Able to explain program exercise prescription;Long Term: Able to explain home exercise prescription to exercise independently          Exercise Goals Re-Evaluation : Exercise Goals Re-Evaluation    [place] Name 03/11/17 1008 04/04/17 0945           Exercise Goal Re-Evaluation   Exercise Goals Review  Increase Physical Activity;Increase Strength and  Stamina;Able to understand and use Dyspnea scale;Able to understand and use rate of perceived exertion (RPE) scale;Knowledge and understanding of Target Heart Rate Range (THRR);Understanding of Exercise Prescription  Increase Strength and Stamina;Increase Physical Activity;Able to understand and use rate of perceived exertion (RPE) scale;Able to understand and use Dyspnea scale;Knowledge and understanding of Target Heart Rate Range (THRR);Understanding of Exercise Prescription      Comments  Patient is progressing well in program. Has a great attitude. Willing to work at his higher threshold. Will cont. to monitor and progress as able.   Patient is progressing well in program. Has a great attitude. Willing to work at his higher threshold. Will cont. to monitor and progress as able.       Expected Outcomes  Through exercise at rehab and at home, patient will increase strength and stamina and decrease fatigue.  Through exercise at rehab and at home, patient will increase strength and stamina and also be able to perform ADL's easier.         Discharge Exercise Prescription (Final Exercise Prescription Changes): Exercise Prescription Changes - 04/02/17 1200      Response to Exercise   Blood Pressure (Admit)  100/58    Blood Pressure (Exercise)  124/64    Blood Pressure (Exit)  98/60    Heart Rate (Admit)  74 bpm    Heart Rate (Exercise)  79 bpm    Heart Rate (Exit)  71 bpm    Oxygen Saturation (Admit)  99 %    Oxygen Saturation (Exercise)  100 %    Oxygen Saturation (Exit)  99 %    Rating of Perceived Exertion (Exercise)  11    Perceived Dyspnea (Exercise)  0    Duration  Continue with 45 min of aerobic exercise without signs/symptoms of physical distress.    Intensity  THRR unchanged      Resistance Training   Training Prescription  Yes    Weight  blue bands    Reps  10-15    Time  10 Minutes      Bike   Level  4.7    Minutes  17      NuStep   Level  4    Minutes  17    METs  2.2       Track   Laps  16    Minutes  17       Nutrition:  Target Goals: Understanding of nutrition guidelines, daily intake of sodium 1500mg , cholesterol 200mg , calories 30% from fat and 7% or less from saturated fats, daily to have 5 or more servings of fruits and vegetables.  Biometrics: Pre Biometrics - 02/01/17 1257      Pre Biometrics   Grip Strength  36 kg        Nutrition Therapy Plan and Nutrition Goals: Nutrition Therapy & Goals - 02/04/17 1206      Nutrition Therapy   Diet  Low Sodium      Personal Nutrition Goals   Nutrition Goal  The pt will recognize symptoms that can interfere with adequate oral intake, such as shortness of breath, N/V, early satiety, fatigue, ability to secure and prepare food, taste and smell changes, chewing/swallowing difficulties, and/ or pain when eating.    Personal Goal #2  Identify food quantities necessary to achieve wt loss of  -2# per week to a goal wt loss of 6-10 lb at graduation from pulmonary rehab.      Intervention Plan   Intervention  Prescribe, educate and counsel regarding individualized specific dietary modifications aiming towards targeted core components such as weight, hypertension, lipid management, diabetes, heart failure and other comorbidities.    Expected Outcomes  Short Term Goal: Understand basic principles of dietary content, such as calories, fat, sodium, cholesterol and nutrients.;Long Term Goal: Adherence to prescribed nutrition plan.       Nutrition Discharge: Rate Your Plate Scores: Nutrition Assessments - 02/04/17 1206      Rate Your Plate Scores   Pre Score  49       Nutrition Goals Re-Evaluation:   Nutrition Goals Discharge (Final Nutrition Goals Re-Evaluation):   Psychosocial: Target Goals: Acknowledge presence or absence of significant depression and/or stress, maximize coping skills, provide positive support system. Participant is able to verbalize types and ability to use techniques and  skills needed for reducing stress and depression.  Initial Review & Psychosocial Screening: Initial Psych Review & Screening - 02/01/17 1313      Initial Review   Current issues with  None Identified      Family Dynamics   Good Support System?  Yes      Barriers   Psychosocial barriers to participate in program  There are no identifiable barriers or psychosocial needs.      Screening Interventions   Interventions  Encouraged to exercise       Quality of Life Scores:   PHQ-9: Recent Review Flowsheet Data    Depression screen [facility] 2/9 02/01/2017   Decreased Interest 0   Down, Depressed, Hopeless 0   PHQ - 2 Score 0     Interpretation of Total Score  Total Score Depression Severity:  1-4 = Minimal depression, 5-9 = Mild depression, 10-14 = Moderate depression, 15-19 = Moderately severe depression, 20-27 = Severe depression   Psychosocial Evaluation and Intervention: Psychosocial Evaluation - 02/01/17 1314      Psychosocial Evaluation & Interventions   Interventions  Encouraged to exercise with the program and follow exercise prescription    Expected Outcomes  patient will remain free from psychosocial barriers to participation in pulmonary rehab.    Continue Psychosocial Services   No Follow up required       Psychosocial Re-Evaluation: Psychosocial Re-Evaluation    [place] Name 03/10/17 2059 04/01/17 1333           Psychosocial Re-Evaluation   Current issues with  None Identified  None Identified      Expected Outcomes  patient will remain free from psychosocial barriers to participation in pulmonary rehab  patient will remain free from psychosocial barriers to participation in pulmonary rehab      Interventions  Encouraged to attend Pulmonary Rehabilitation for the exercise  Encouraged to attend Pulmonary Rehabilitation for the exercise      Continue Psychosocial Services   No Follow up required  No Follow up required         Psychosocial Discharge (Final  Psychosocial Re-Evaluation): Psychosocial Re-Evaluation - 04/01/17 1333      Psychosocial Re-Evaluation   Current issues with  None Identified    Expected Outcomes  patient will remain free from psychosocial barriers to participation in pulmonary rehab    Interventions  Encouraged to attend Pulmonary Rehabilitation for the exercise    Continue Psychosocial Services   No Follow up required       Education: Education Goals: Education classes will be provided on a weekly basis, covering required topics. Participant will state understanding/return demonstration of topics presented.  Learning Barriers/Preferences: Learning Barriers/Preferences - 02/01/17 1311      Learning Barriers/Preferences   Learning Barriers  None    Learning Preferences  Written Material;Group Instruction;Verbal Instruction;Skilled Demonstration       Education Topics: Risk Factor Reduction:  -Group instruction that is supported by a PowerPoint presentation. Instructor discusses the definition of a risk factor, different risk factors for pulmonary disease, and how the heart and lungs work together.     Nutrition for Pulmonary Patient:  -Group instruction provided by PowerPoint slides, verbal discussion, and written materials to support subject matter. The instructor gives an explanation and review of healthy diet recommendations, which includes a discussion on weight management, recommendations for fruit and vegetable consumption, as well as protein, fluid, caffeine, fiber, sodium, sugar, and alcohol. Tips for eating when patients are short of breath are discussed.   PULMONARY REHAB OTHER RESPIRATORY from 03/28/2017 in [place]  Date  03/07/17  Educator  RD  Instruction Review Code  2- meets goals/outcomes      Pursed Lip Breathing:  -Group instruction that is supported by demonstration and informational handouts. Instructor discusses the benefits of pursed lip and diaphragmatic  breathing and detailed demonstration on how to  preform both.     Oxygen Safety:  -Group instruction provided by PowerPoint, verbal discussion, and written material to support subject matter. There is an overview of "What is Oxygen" and "Why do we need it".  Instructor also reviews how to create a safe environment for oxygen use, the importance of using oxygen as prescribed, and the risks of noncompliance. There is a brief discussion on traveling with oxygen and resources the patient may utilize.   PULMONARY REHAB OTHER RESPIRATORY from 03/28/2017 in [place]  Date  03/14/17  Educator  Chrishawn Boley  Instruction Review Code  2- meets goals/outcomes      Oxygen Equipment:  -Group instruction provided by [employer] Staff utilizing handouts, written materials, and equipment demonstrations.   Signs and Symptoms:  -Group instruction provided by written material and verbal discussion to support subject matter. Warning signs and symptoms of infection, stroke, and heart attack are reviewed and when to call the physician/911 reinforced. Tips for preventing the spread of infection discussed.   Advanced Directives:  -Group instruction provided by verbal instruction and written material to support subject matter. Instructor reviews Advanced Directive laws and proper instruction for filling out document.   Pulmonary Video:  -Group video education that reviews the importance of medication and oxygen compliance, exercise, good nutrition, pulmonary hygiene, and pursed lip and diaphragmatic breathing for the pulmonary patient.   PULMONARY REHAB OTHER RESPIRATORY from 03/28/2017 in [place]  Date  02/14/17  Instruction Review Code  2- meets goals/outcomes      Exercise for the Pulmonary Patient:  -Group instruction that is supported by a PowerPoint presentation. Instructor discusses benefits of exercise, core components of exercise, frequency,  duration, and intensity of an exercise routine, importance of utilizing pulse oximetry during exercise, safety while exercising, and options of places to exercise outside of rehab.     Pulmonary Medications:  -Verbally interactive group education provided by instructor with focus on inhaled medications and proper administration.   PULMONARY REHAB OTHER RESPIRATORY from 03/28/2017 in [place]  Date  03/21/17  Educator  Pharm D  Instruction Review Code  2- meets goals/outcomes      Anatomy and Physiology of the Respiratory System and Intimacy:  -Group instruction provided by PowerPoint, verbal discussion, and written material to support subject matter. Instructor reviews respiratory cycle and anatomical components of the respiratory system and their functions. Instructor also reviews differences in obstructive and restrictive respiratory diseases with examples of each. Intimacy, Sex, and Sexuality differences are reviewed with a discussion on how relationships can change when diagnosed with pulmonary disease. Common sexual concerns are reviewed.   MD DAY -A group question and answer session with a medical doctor that allows participants to ask questions that relate to their pulmonary disease state.   PULMONARY REHAB OTHER RESPIRATORY from 03/28/2017 in [place]  Date  02/26/17  Educator  yacoub  Instruction Review Code  2- meets goals/outcomes      OTHER EDUCATION -Group or individual verbal, written, or video instructions that support the educational goals of the pulmonary rehab program.   PULMONARY REHAB OTHER RESPIRATORY from 03/28/2017 in [place]  Date  03/28/17 [MIWOEHO Eating]  Educator  RD  Instruction Review Code  1- Verbalizes Understanding      Knowledge Questionnaire Score: Knowledge Questionnaire Score - 02/13/17 1406      Knowledge Questionnaire Score   Pre Score  12/13        Core Components/Risk Factors/Patient Goals at Admission: Personal Goals and Risk Factors at Admission - 02/01/17 1312      Core Components/Risk Factors/Patient Goals on Admission   Improve shortness of breath with ADL's  Yes    Intervention  Provide education, individualized exercise plan and daily activity instruction to help decrease symptoms of SOB with activities of daily living.    Expected Outcomes  Short Term: Achieves a reduction of symptoms when performing activities of daily living.    Develop more efficient breathing techniques such as purse lipped breathing and diaphragmatic breathing; and practicing self-pacing with activity  Yes    Intervention  Provide education, demonstration and support about specific breathing techniuqes utilized for more efficient breathing. Include techniques such as pursed lipped breathing, diaphragmatic breathing and self-pacing activity.    Expected Outcomes  Short Term: Participant will be able to demonstrate and use breathing techniques as needed throughout daily activities.    Heart Failure  Yes    Intervention  Provide a combined exercise and nutrition program that is supplemented with education, support and counseling about heart failure. Directed toward relieving symptoms such as shortness of breath, decreased exercise tolerance, and extremity edema.    Expected Outcomes  Improve functional capacity of life;Short term: Attendance in program 2-3 days a week with increased exercise capacity. Reported lower sodium intake. Reported increased fruit and vegetable intake. Reports medication compliance.;Short term: Daily weights obtained and reported for increase. Utilizing diuretic protocols set by physician.;Long term: Adoption of self-care skills and reduction of barriers for early signs and symptoms recognition and intervention leading to self-care maintenance.       Core Components/Risk Factors/Patient Goals Review:  Goals and Risk Factor Review     Row Name 03/10/17 2055 04/01/17 1330           Core Components/Risk Factors/Patient Goals Review   Personal Goals Review  Improve shortness of breath with ADL's;Develop more efficient breathing techniques such as purse lipped breathing and diaphragmatic breathing and practicing self-pacing with activity.;Heart Failure  Improve shortness of breath with ADL's;Develop more efficient breathing techniques such as purse lipped breathing and diaphragmatic breathing and practicing self-pacing with activity.;Heart Failure      Review  patient is doing well in pulmonary rehab. he is tolerating increases in workloads and has been observed using PLB as recommended. he continues to verbalize compliance with daily weights at home and his weight does not show a 3 lb wieght gain from one pulmonary session to another. He verbalizes some improvement in his shortness of breath.   patient continues to do well in pulmonary rehab. he continues to tolerate workload increases and continues to utilize PLB. his weight remains stable and does not show any signs of fluid overload. he verbalizes compliance with heart failure recomendations at home such as daily weights and diet compliance. he states he has gotten physically stronger during his stay in pulmonary rehab.      Expected Outcomes  see admission expected outcomes  see admission expected outcomes         Core Components/Risk Factors/Patient Goals at Discharge (Final Review):  Goals and Risk Factor Review - 04/01/17 1330      Core Components/Risk Factors/Patient Goals Review   Personal Goals Review  Improve shortness of breath with ADL's;Develop more efficient breathing techniques such as purse lipped breathing and diaphragmatic breathing and practicing self-pacing with activity.;Heart Failure    Review  patient continues to do well in pulmonary  rehab. he continues to tolerate workload increases and continues to utilize PLB. his weight remains stable and does not  show any signs of fluid overload. he verbalizes compliance with heart failure recomendations at home such as daily weights and diet compliance. he states he has gotten physically stronger during his stay in pulmonary rehab.    Expected Outcomes  see admission expected outcomes       ITP Comments:   Comments: ITP REVIEW Pt is making expected progress toward pulmonary rehab goals after completing 16 sessions. Recommend continued exercise, life style modification, education, and utilization of breathing techniques to increase stamina and strength and decrease shortness of breath with exertion.

## 2017-04-05 ENCOUNTER — Ambulatory Visit (HOSPITAL_COMMUNITY)
Admission: RE | Admit: 2017-04-05 | Discharge: 2017-04-05 | Disposition: A | Discharge status: Home / Self Care | Payer: Medicare Other | Source: Ambulatory Visit | Attending: Cardiology | Admitting: Cardiology

## 2017-04-05 ENCOUNTER — Encounter (HOSPITAL_COMMUNITY): Payer: Self-pay | Admitting: Cardiology

## 2017-04-05 VITALS — BP 122/78 | HR 54 | Wt 175.0 lb

## 2017-04-05 DIAGNOSIS — I5022 Chronic systolic (congestive) heart failure: Secondary | ICD-10-CM | POA: Diagnosis not present

## 2017-04-05 DIAGNOSIS — I130 Hypertensive heart and chronic kidney disease with heart failure and stage 1 through stage 4 chronic kidney disease, or unspecified chronic kidney disease: Secondary | ICD-10-CM | POA: Insufficient documentation

## 2017-04-05 DIAGNOSIS — Z7901 Long term (current) use of anticoagulants: Secondary | ICD-10-CM | POA: Insufficient documentation

## 2017-04-05 DIAGNOSIS — I495 Sick sinus syndrome: Secondary | ICD-10-CM | POA: Insufficient documentation

## 2017-04-05 DIAGNOSIS — I480 Paroxysmal atrial fibrillation: Secondary | ICD-10-CM

## 2017-04-05 DIAGNOSIS — Z8249 Family history of ischemic heart disease and other diseases of the circulatory system: Secondary | ICD-10-CM | POA: Insufficient documentation

## 2017-04-05 DIAGNOSIS — Z79899 Other long term (current) drug therapy: Secondary | ICD-10-CM | POA: Insufficient documentation

## 2017-04-05 DIAGNOSIS — N183 Chronic kidney disease, stage 3 unspecified: Secondary | ICD-10-CM

## 2017-04-05 DIAGNOSIS — I25810 Atherosclerosis of coronary artery bypass graft(s) without angina pectoris: Secondary | ICD-10-CM | POA: Diagnosis not present

## 2017-04-05 DIAGNOSIS — I255 Ischemic cardiomyopathy: Secondary | ICD-10-CM | POA: Insufficient documentation

## 2017-04-05 DIAGNOSIS — E785 Hyperlipidemia, unspecified: Secondary | ICD-10-CM | POA: Diagnosis not present

## 2017-04-05 DIAGNOSIS — Z8546 Personal history of malignant neoplasm of prostate: Secondary | ICD-10-CM | POA: Diagnosis not present

## 2017-04-05 LAB — COMPREHENSIVE METABOLIC PANEL
ALK PHOS: 45 U/L (ref 38–126)
ALT: 15 U/L — AB (ref 17–63)
AST: 24 U/L (ref 15–41)
Albumin: 3.8 g/dL (ref 3.5–5.0)
Anion gap: 9 (ref 5–15)
BILIRUBIN TOTAL: 0.7 mg/dL (ref 0.3–1.2)
BUN: 66 mg/dL — AB (ref 6–20)
CALCIUM: 9.3 mg/dL (ref 8.9–10.3)
CHLORIDE: 103 mmol/L (ref 101–111)
CO2: 26 mmol/L (ref 22–32)
CREATININE: 2.83 mg/dL — AB (ref 0.61–1.24)
GFR, EST AFRICAN AMERICAN: 22 mL/min — AB (ref >60)
GFR, EST NON AFRICAN AMERICAN: 19 mL/min — AB (ref >60)
Glucose, Bld: 98 mg/dL (ref 65–99)
Potassium: 4.1 mmol/L (ref 3.5–5.1)
Sodium: 138 mmol/L (ref 135–145)
TOTAL PROTEIN: 7.3 g/dL (ref 6.5–8.1)

## 2017-04-05 LAB — TSH: TSH: 1.407 u[IU]/mL (ref 0.350–4.500)

## 2017-04-05 MED ORDER — TORSEMIDE 20 MG PO TABS: ORAL_TABLET | ORAL | 6 refills | Status: DC

## 2017-04-05 NOTE — Patient Instructions (Signed)
Increase TorsemideTake 40 mg (2 tabs) one day alternating with 20 mg (1 tab) the next day continuously.  Labs drawn today (if we do not call you, then your lab work was stable)   Your physician recommends that you return for lab work in: 10 days  Your physician has requested that you have an echocardiogram. Echocardiography is a painless test that uses sound waves to create images of your heart. It provides your doctor with information about the size and shape of your heart and how well your heart's chambers and valves are working. This procedure takes approximately one hour. There are no restrictions for this procedure.  Your physician recommends that you schedule a follow-up appointment in: 6 weeks with Dr. Aundra Dubin  With an echocardiogram

## 2017-04-07 NOTE — Progress Notes (Signed)
Advanced Heart Failure Clinic Note   Patient ID: Marcus [name], male   DOB: 10-19-1928, 83 y.o.   MRN: [id number] EP: Dr Lovena Le  Cardiology: Aundra Dubin  83 y.o. with history of pacemaker for bradycardia, paroxysmal atrial fibrillation, CAD s/p CABG and PCIs, and ischemic cardiomyopathy EF 20-25%. Underwent CRT-D upgrade 10/08/13 due to 85% RV pacing.     Admitted 5/19-5/26/15 with volume overload. He was diuresed with milrinone and IV lasix and discharge weight was 153 lbs. He underwent upgrade of his ICD to CRT-D as well.  He was able to wean off milrinone and did much better after CRT upgrade.  He was admitted in 3/16 with pacemaker pocket infection.  CRT-D device was explanted and temporary-permanent pacemaker with placed.  He later had a St Jude CRT-P system re-implanted.  He was noted to be back in atrial fibrillation and had TEE-guided DCCV to NSR.  TEE showed EF 25-30%.    RHC in 10/17 showed near-normal filling pressures and low but not markedly low cardiac output.  Coreg was cut back to 6.25 mg bid.  Echo in 10/17 showed improvement in LV function, EF 35-40%.   He returns today for followup of CHF.  He is doing pulmonary rehab.  Weight is up 8 lbs. No chest pain.  No dyspnea walking on flat ground, can walk up a flight of stairs without problems.  No BRBPR/melena.  No palpitations.  No orthopnea/PND.  He is torsemide 20 mg daily.     Labs (3/16): K 4.1 => 4.6, creatinine 1.87 => 1.97, HCT 34.4, TSH normal, LFTs normal.  Labs (6/16): K 4.2, creatinine 2.34 Labs (10/16): K 4.8, creatinine 2.29 Labs (6/17): K 5.1, creatinine 1.89, AST 30, ALT 25, TSH normal, LDL 159, HCT 37 Labs (10/17): K 5, creatinine 2.45, TSH normal, hgb 12.7, BNP 453 Labs (11/17): LFTs normal Labs (12/17): hgb 11.7, creatinine 2.16 Labs (1/18): K 4.9, creatinine 2.54 Labs (7/18): K 4.3, creatinine 2.87, hgb 12.5 Labs (8/18): K 4.1, creatinine 2.31, LFTs normal, BNP 816, TSH normal Labs (9/18): K 4.4, creatinine  2.41, BNP 761, hgb 12.8  PMH:  1. Symptomatic bradycardia: [employer] CRT-D.  He developed pocket infection in 3/16 and had device extracted.  He had St Jude CRT-P system replaced.  2. HTN 3. Hyperlipidemia: Has refused statins.  4. Atrial fibrillation: History of prior DCCV.  Paroxysmal.  On Eliquis.  Has history of cardioembolism to right leg with right popliteal and tibial embolectomy (had been off Eliquis). Has had nausea with amiodarone in the past but now tolerating.  Recurrence in 3/16, had TEE-guided DCCV.  5. Prostate cancer 6. CAD: CABG remotely with SVG-OM and LIMA-LAD.  Late 1990s had PCI to RCA.  In 12/09 had LHC with total occlusion of SVG-OM, patent LIMA-LAD, DES to LCx and DES to RCA.   7. Ischemic cardiomyopathy: Echo (3/15) with EF 20-25%, wall motion abnormalities noted, mild to moderately decreased RV systolic function. ICD upgraded to [place] CRT-D 10/08/13.  TEE (3/16) with EF 25-30%, mildly dilated RV with mildly decreased systolic function, mild to moderate MR.  - RHC (10/17): mean RA 4, PA 44/21 mean 31, unable to wedge, CI 2.0. - Echo (10/17): EF 35-40%, moderate dilation, inferior/inferolateral AK, moderate MR, RV mildly dilated with mildly decreased systolic function.  8. CKD  SH: Married, nonsmoker, lives in [place]  [place]: CAD  ROS: All systems reviewed and negative except as per HPI.   Current Outpatient Medications  Medication Sig Dispense Refill  .  amiodarone (PACERONE) 200 MG tablet Take 0.5 tablets (100 mg total) by mouth daily. 45 tablet 3  . apixaban (ELIQUIS) 2.5 MG TABS tablet Take 1 tablet (2.5 mg total) by mouth 2 (two) times daily. 180 tablet 3  . carvedilol (COREG) 6.25 MG tablet Take 1 tablet (6.25 mg total) by mouth 2 (two) times daily with a meal. 180 tablet 2  . Coenzyme Q10 (CO Q 10) 100 MG CAPS Take 1 capsule by mouth 2 (two) times daily.    Marland Kitchen FLUZONE HIGH-DOSE 0.5 ML injection     . GLUTATHIONE PO Take by mouth daily. Take as directed    .  isosorbide mononitrate (IMDUR) 30 MG 24 hr tablet Take 0.5 tablets (15 mg total) by mouth daily. 45 tablet 3  . Multiple Vitamins-Minerals (MULTIVITAMINS/MINERALS ADULT PO) Take by mouth daily. MacuGuard    . Multiple Vitamins-Minerals (PRESERVISION AREDS 2 PO) Take 1 capsule by mouth 2 (two) times daily.    Marland Kitchen PREVNAR 13 SUSP injection     . spironolactone (ALDACTONE) 25 MG tablet TAKE ONE-HALF TABLET BY MOUTH ONCE DAILY 45 tablet 3  . torsemide (DEMADEX) 20 MG tablet Take 40 mg (2 tabs) one day alternating with 20 mg (1 tab) the next day continuously. 45 tablet 6   No current facility-administered medications for this encounter.     Vitals:   04/05/17 1428  BP: 122/78  Pulse: (!) 54  SpO2: 98%  Weight: 175 lb (79.4 kg)   Wt Readings from Last 3 Encounters:  04/05/17 175 lb (79.4 kg)  04/02/17 172 lb 13.5 oz (78.4 kg)  03/28/17 173 lb 1 oz (78.5 kg)    General: NAD Neck: JVP 8-9 cm with HJR, no thyromegaly or thyroid nodule.  Lungs: Slight crackles at bases bilaterally.  CV: Nondisplaced PMI.  Heart regular S1/S2, no S3/S4, no murmur.  Trace ankle edema.  No carotid bruit.  Normal pedal pulses.  Abdomen: Soft, nontender, no hepatosplenomegaly, no distention.  Skin: Intact without lesions or rashes.  Neurologic: Alert and oriented x 3.  Psych: Normal affect. Extremities: No clubbing or cyanosis.  HEENT: Normal.   Assessment/Plan:  1. Chronic systolic CHF: Ischemic cardiomyopathy.  EF 25-30% on 3/16 TEE, improved to 35-40% on most recent echo in 10/17.  Has St Jude CRT-P device.  NYHA class II symptoms, stable. However, he is volume overloaded on exam with weight up.   - Increase torsemide to 40 mg daily alternating with 20 mg daily.  BMET today and in 10 days.     - Continue current spironolactone 12.5 daily and Coreg 6.25 mg bid.   - Not on ACEI/ARB with CKD. - I will arrange for repeat echo.  2. Atrial fibrillation: Paroxysmal. He is in NSR on amiodarone 100 mg daily. -  Check LFTs and TSH today.  Needs regular eye exam. - He is on Eliquis 2.5 mg bid (age, elevated creatinine).  3. CAD: Stable. No chest pain. Continue medical management.  - He is not currently on a statin (he has refused to take a statin).   - He is not on ASA given use of Eliquis and stable CAD.  4. Sick sinus syndrome: s/p CRT-P. 5. CKD, stage III: BMET today.     Followup in 6 wks.   [name] 04/07/2017

## 2017-04-08 ENCOUNTER — Encounter (HOSPITAL_COMMUNITY): Payer: Medicare Other | Admitting: Cardiology

## 2017-04-09 ENCOUNTER — Encounter (HOSPITAL_COMMUNITY)
Admission: RE | Admit: 2017-04-09 | Discharge: 2017-04-09 | Disposition: A | Discharge status: Home / Self Care | Payer: Medicare Other | Source: Ambulatory Visit | Attending: Cardiology | Admitting: Cardiology

## 2017-04-09 VITALS — Wt 174.4 lb

## 2017-04-09 DIAGNOSIS — E785 Hyperlipidemia, unspecified: Secondary | ICD-10-CM | POA: Diagnosis not present

## 2017-04-09 DIAGNOSIS — I130 Hypertensive heart and chronic kidney disease with heart failure and stage 1 through stage 4 chronic kidney disease, or unspecified chronic kidney disease: Secondary | ICD-10-CM | POA: Diagnosis not present

## 2017-04-09 DIAGNOSIS — I255 Ischemic cardiomyopathy: Secondary | ICD-10-CM | POA: Diagnosis not present

## 2017-04-09 DIAGNOSIS — I5022 Chronic systolic (congestive) heart failure: Secondary | ICD-10-CM | POA: Diagnosis not present

## 2017-04-09 DIAGNOSIS — I739 Peripheral vascular disease, unspecified: Secondary | ICD-10-CM | POA: Diagnosis not present

## 2017-04-09 DIAGNOSIS — N183 Chronic kidney disease, stage 3 (moderate): Secondary | ICD-10-CM | POA: Diagnosis not present

## 2017-04-09 NOTE — Progress Notes (Signed)
Daily Session Note  Patient Details  Name: Marcus [name] MRN: [id number] Date of Birth: [birth year] Referring Provider:     Pulmonary Rehab Walk Test from 02/07/2017 in [place]  Referring Provider  Aundra Dubin      Encounter Date: 04/09/2017  Check In: Session Check In - 04/09/17 1016      Check-In   Location  [facility]    Staff Present  Su Hilt, MS, ACSM RCEP, Exercise Physiologist;Joan Leonia Reeves, RN, Luisa Hart, RN, Roque Cash, RN    Supervising physician immediately available to respond to emergencies  [facility] immediately available    Physician(s)  Dr. Maylene Roes    Medication changes reported      No    Fall or balance concerns reported     No    Tobacco Cessation  No Change    Warm-up and Cool-down  Performed as group-led instruction    Resistance Training Performed  Yes    VAD Patient?  No      Pain Assessment   Currently in Pain?  No/denies    Multiple Pain Sites  No       Capillary Blood Glucose: No results found for this or any previous visit (from the past 24 hour(s)).  Exercise Prescription Changes - 04/09/17 1200      Response to Exercise   Blood Pressure (Admit)  112/34    Blood Pressure (Exercise)  120/64    Blood Pressure (Exit)  116/58    Heart Rate (Admit)  77 bpm    Heart Rate (Exercise)  86 bpm    Heart Rate (Exit)  69 bpm    Oxygen Saturation (Admit)  100 %    Oxygen Saturation (Exercise)  99 %    Oxygen Saturation (Exit)  100 %    Rating of Perceived Exertion (Exercise)  11    Perceived Dyspnea (Exercise)  2    Duration  Continue with 45 min of aerobic exercise without signs/symptoms of physical distress.    Intensity  THRR unchanged      Progression   Progression  Continue to progress workloads to maintain intensity without signs/symptoms of physical distress.      Resistance Training   Training Prescription  Yes    Weight  blue bands    Reps  10-15    Time  10  Minutes      Bike   Level  4.7    Minutes  17      NuStep   Level  4    Minutes  17    METs  2.2      Track   Laps  16    Minutes  17       Social History   Tobacco Use  Smoking Status Former Smoker  . Last attempt to quit: 02/10/1971  . Years since quitting: 46.1  Smokeless Tobacco Never Used  Tobacco Comment   quit smoking 68yr ago    Goals Met:  Exercise tolerated well No report of cardiac concerns or symptoms Strength training completed today  Goals Unmet:  Not Applicable  Comments: Service time is from 1030 to 1210    Dr. WRush Farmeris Medical Director [facility]

## 2017-04-15 ENCOUNTER — Ambulatory Visit (INDEPENDENT_AMBULATORY_CARE_PROVIDER_SITE_OTHER): Payer: Medicare Other | Admitting: *Deleted

## 2017-04-15 DIAGNOSIS — Z950 Presence of cardiac pacemaker: Secondary | ICD-10-CM | POA: Diagnosis not present

## 2017-04-15 DIAGNOSIS — I5022 Chronic systolic (congestive) heart failure: Secondary | ICD-10-CM | POA: Diagnosis not present

## 2017-04-15 DIAGNOSIS — I442 Atrioventricular block, complete: Secondary | ICD-10-CM

## 2017-04-15 NOTE — Progress Notes (Signed)
Remote ICD transmission.   

## 2017-04-15 NOTE — Progress Notes (Signed)
EPIC Encounter for ICM Monitoring  Patient Name: Marcus [name] is a 83 y.o. male Date: 04/15/2017 Primary Care Physican: Patient, No Pcp Per Primary Cardiologist:[place] Electrophysiologist: Lovena Le Dry Weight: 168 lb on home scale Bi-V Pacing: >99%          Spoke with wife.  Pt stopped taking Torsemide 40 mg daily alternating with 20 mg on 11/23 (dosage was increased 11/16).  He decreased dosage to 20 mg daily because he started having severe debilitating lower back pain on 04/07/2017 and thought the increased dosage had effected his kidneys.  Once he stopped taking the increased dosage the pain has subsided.  Pt canceled BMET scheduled for 04/17/2017.   Wife stated patient has now began to understand he has been eating too much salt daily and changing his diet.   Thoracic impedance normal since 04/07/2017  Prescribed dosage: Torsemide 20 mg take 40 mg (2 tabs) one day alternating with 20 mg (1 tab) the next day continuously. (increased on 11/16)  Labs: 04/05/2017 Creatinine 2.83, BUN 66, Potassium 4.1, Sodium 138, EGFR 19-22 02/08/2017 Creatinine 2.41, BUN 57, Potassium 4.4, Sodium 140, EGFR 23-27 01/09/2017 Creatinine 2.31, BUN 49, Potassium 4.1, Sodium 141, EGFR 24-28 12/05/2016 Creatinine 2.87, BUN 71, Potassium 4.3, Sodium 139, EGFR 19-22 11/22/2016 Creatinine 2.57, BUN 61, Potassium 4.3, Sodium 139, EGFR 21-25  06/14/2016 Creatinine 2.54. BUN 59, Potassium 4.9, Sodium 137, EGFR 22-25 06/01/2016 Creatinine 2.78, BUN 78, Potassium 4.5, Sodium 136, EGFR 19-22  05/22/2016 Creatinine 2.38, BUN 60, Potassium 4.7, Sodium 136, EGFR 23-27 05/08/2016 Creatinine 2.16, BUN 40, Potassium 4.5, Sodium 143, EGFR 26-30 03/26/2016 Creatinine 1.77, BUN 26, Potassium 4.3, Sodium 140, EGFR 33-39 02/21/2016 Creatinine 2.45, BUN 75, Potassium 5.0, Sodium 138, EGFR 23-26  10/05/2016Creatinine 2.29, BUN 49, Potassium 4.8, Sodium 137, EGFR 25-29   Explained it is not safe to adjust a medication  without consulting with the physician's if he develops side effects.        Recommendations:   Copy sent to Dr Lovena Le and Dr Aundra Dubin for review and recommendation regarding Torsemide dosage.   Advised wife to have BMET drawn 11/28       as ordered by Dr Aundra Dubin.   Follow-up plan: ICM clinic phone appointment on 04/23/2017.    3 month ICM trend: 04/15/2017    1 Year ICM trend:       Rosalene Billings, RN 04/15/2017 2:49 PM

## 2017-04-16 ENCOUNTER — Encounter (HOSPITAL_COMMUNITY)
Admission: RE | Admit: 2017-04-16 | Discharge: 2017-04-16 | Disposition: A | Discharge status: Home / Self Care | Payer: Medicare Other | Source: Ambulatory Visit | Attending: Cardiology | Admitting: Cardiology

## 2017-04-16 VITALS — Wt 174.4 lb

## 2017-04-16 DIAGNOSIS — I739 Peripheral vascular disease, unspecified: Secondary | ICD-10-CM | POA: Diagnosis not present

## 2017-04-16 DIAGNOSIS — I5022 Chronic systolic (congestive) heart failure: Secondary | ICD-10-CM

## 2017-04-16 DIAGNOSIS — E785 Hyperlipidemia, unspecified: Secondary | ICD-10-CM | POA: Diagnosis not present

## 2017-04-16 DIAGNOSIS — N183 Chronic kidney disease, stage 3 (moderate): Secondary | ICD-10-CM | POA: Diagnosis not present

## 2017-04-16 DIAGNOSIS — I130 Hypertensive heart and chronic kidney disease with heart failure and stage 1 through stage 4 chronic kidney disease, or unspecified chronic kidney disease: Secondary | ICD-10-CM | POA: Diagnosis not present

## 2017-04-16 DIAGNOSIS — I255 Ischemic cardiomyopathy: Secondary | ICD-10-CM | POA: Diagnosis not present

## 2017-04-16 NOTE — Progress Notes (Signed)
Daily Session Note  Patient Details  Name: Marcus [name] MRN: [id number] Date of Birth: 1928/06/16 Referring Provider:     Pulmonary Rehab Walk Test from 02/07/2017 in [place]  Referring Provider  Aundra Dubin      Encounter Date: 04/16/2017  Check In:   Capillary Blood Glucose: No results found for this or any previous visit (from the past 24 hour(s)).  Exercise Prescription Changes - 04/16/17 1200      Response to Exercise   Blood Pressure (Admit)  104/54    Blood Pressure (Exercise)  124/74    Blood Pressure (Exit)  92/60 drank H2O recheck BP 100/60    Heart Rate (Admit)  84 bpm    Heart Rate (Exercise)  95 bpm    Heart Rate (Exit)  78 bpm    Oxygen Saturation (Admit)  98 %    Oxygen Saturation (Exercise)  99 %    Oxygen Saturation (Exit)  100 %    Rating of Perceived Exertion (Exercise)  11    Perceived Dyspnea (Exercise)  2    Duration  Continue with 45 min of aerobic exercise without signs/symptoms of physical distress.    Intensity  THRR unchanged      Progression   Progression  Continue to progress workloads to maintain intensity without signs/symptoms of physical distress.      Resistance Training   Training Prescription  Yes    Weight  blue bands    Reps  10-15    Time  10 Minutes      Bike   Level  4.7    Minutes  17      NuStep   Level  5    Minutes  17    METs  1.9      Track   Laps  15    Minutes  17       Social History   Tobacco Use  Smoking Status Former Smoker  . Last attempt to quit: 02/10/1971  . Years since quitting: 46.2  Smokeless Tobacco Never Used  Tobacco Comment   quit smoking 30yr ago    Goals Met:  Exercise tolerated well No report of cardiac concerns or symptoms Strength training completed today  Goals Unmet:  Not Applicable  Comments: Service time is from 1030 to 1215    Dr. WRush Farmeris Medical Director [facility] at [facility]

## 2017-04-16 NOTE — Progress Notes (Signed)
Call back to wife.  Asked if patient's back pain is better and she said yes, he is feeling better.  Patient's last dose of Torsemide was Saturday because he felt the increase in Torsemide on 04/05/2017 caused his severe back pain.  Weight today at home is 167 lbs.  Advised to have patient call Dr Claris Gladden office tomorrow to inform Dr Aundra Dubin that he was unable to tolerate the increased Torsemide dosage.  Advised the concern that stopping Torsemide will cause him to have fluid overload.  She is unsure if patient will have blood drawn tomorrow as ordered by Dr Aundra Dubin.  Explained if I receive any recommendations from Dr Aundra Dubin this evening I will call her back and if not, she should call his office tomorrow.

## 2017-04-17 ENCOUNTER — Ambulatory Visit (HOSPITAL_COMMUNITY)
Admission: RE | Admit: 2017-04-17 | Discharge: 2017-04-17 | Disposition: A | Discharge status: Home / Self Care | Payer: Medicare Other | Source: Ambulatory Visit | Attending: Internal Medicine | Admitting: Internal Medicine

## 2017-04-17 ENCOUNTER — Other Ambulatory Visit (HOSPITAL_COMMUNITY): Payer: Medicare Other

## 2017-04-17 DIAGNOSIS — I5022 Chronic systolic (congestive) heart failure: Secondary | ICD-10-CM | POA: Insufficient documentation

## 2017-04-17 LAB — BASIC METABOLIC PANEL
Anion gap: 6 (ref 5–15)
BUN: 60 mg/dL — AB (ref 6–20)
CHLORIDE: 102 mmol/L (ref 101–111)
CO2: 28 mmol/L (ref 22–32)
Calcium: 9.3 mg/dL (ref 8.9–10.3)
Creatinine, Ser: 2.56 mg/dL — ABNORMAL HIGH (ref 0.61–1.24)
GFR calc Af Amer: 25 mL/min — ABNORMAL LOW (ref >60)
GFR calc non Af Amer: 21 mL/min — ABNORMAL LOW (ref >60)
GLUCOSE: 103 mg/dL — AB (ref 65–99)
POTASSIUM: 4.6 mmol/L (ref 3.5–5.1)
Sodium: 136 mmol/L (ref 135–145)

## 2017-04-18 ENCOUNTER — Encounter (HOSPITAL_COMMUNITY)
Admission: RE | Admit: 2017-04-18 | Discharge: 2017-04-18 | Disposition: A | Discharge status: Home / Self Care | Payer: Medicare Other | Source: Ambulatory Visit | Attending: Cardiology | Admitting: Cardiology

## 2017-04-18 VITALS — Wt 175.0 lb

## 2017-04-18 DIAGNOSIS — I5022 Chronic systolic (congestive) heart failure: Secondary | ICD-10-CM

## 2017-04-18 DIAGNOSIS — I255 Ischemic cardiomyopathy: Secondary | ICD-10-CM | POA: Diagnosis not present

## 2017-04-18 DIAGNOSIS — I739 Peripheral vascular disease, unspecified: Secondary | ICD-10-CM | POA: Diagnosis not present

## 2017-04-18 DIAGNOSIS — N183 Chronic kidney disease, stage 3 (moderate): Secondary | ICD-10-CM | POA: Diagnosis not present

## 2017-04-18 DIAGNOSIS — I130 Hypertensive heart and chronic kidney disease with heart failure and stage 1 through stage 4 chronic kidney disease, or unspecified chronic kidney disease: Secondary | ICD-10-CM | POA: Diagnosis not present

## 2017-04-18 DIAGNOSIS — E785 Hyperlipidemia, unspecified: Secondary | ICD-10-CM | POA: Diagnosis not present

## 2017-04-18 NOTE — Progress Notes (Signed)
Daily Session Note  Patient Details  Name: Marcus [name] MRN: [id number] Date of Birth: 1929-04-04 Referring Provider:     Pulmonary Rehab Walk Test from 02/07/2017 in [place]  Referring Provider  Aundra Dubin      Encounter Date: 04/18/2017  Check In: Session Check In - 04/18/17 1031      Check-In   Location  [facility]    Staff Present  Rosebud Poles, RN, BSN;Molly diVincenzo, MS, ACSM RCEP, Exercise Physiologist;Lamir Racca Rollene Rotunda, RN, Roque Cash, RN    Supervising physician immediately available to respond to emergencies  [facility] immediately available    Physician(s)  Dr. Nevada Crane    Medication changes reported      No    Fall or balance concerns reported     No    Tobacco Cessation  No Change    Warm-up and Cool-down  Performed as group-led instruction    Resistance Training Performed  Yes    VAD Patient?  No      Pain Assessment   Currently in Pain?  No/denies    Multiple Pain Sites  No       Capillary Blood Glucose: Results for orders placed or performed during the hospital encounter of 04/17/17 (from the past 24 hour(s))  Basic metabolic panel     Status: Abnormal   Collection Time: 04/17/17  1:57 PM  Result Value Ref Range   Sodium 136 135 - 145 mmol/L   Potassium 4.6 3.5 - 5.1 mmol/L   Chloride 102 101 - 111 mmol/L   CO2 28 22 - 32 mmol/L   Glucose, Bld 103 (H) 65 - 99 mg/dL   BUN 60 (H) 6 - 20 mg/dL   Creatinine, Ser 2.56 (H) 0.61 - 1.24 mg/dL   Calcium 9.3 8.9 - 10.3 mg/dL   GFR calc non Af Amer 21 (L) >60 mL/min   GFR calc Af Amer 25 (L) >60 mL/min   Anion gap 6 5 - 15    Exercise Prescription Changes - 04/18/17 1332      Response to Exercise   Blood Pressure (Admit)  100/54    Blood Pressure (Exercise)  132/70    Blood Pressure (Exit)  102/64    Heart Rate (Admit)  74 bpm    Heart Rate (Exercise)  90 bpm    Heart Rate (Exit)  70 bpm    Oxygen Saturation (Admit)  99 %    Oxygen Saturation  (Exercise)  100 %    Oxygen Saturation (Exit)  99 %    Rating of Perceived Exertion (Exercise)  11    Perceived Dyspnea (Exercise)  1    Duration  Continue with 45 min of aerobic exercise without signs/symptoms of physical distress.    Intensity  THRR unchanged      Progression   Progression  Continue to progress workloads to maintain intensity without signs/symptoms of physical distress.      Resistance Training   Training Prescription  Yes    Weight  blue bands    Reps  10-15    Time  10 Minutes      Bike   Level  4.7    Minutes  17      Track   Laps  16    Minutes  17       Social History   Tobacco Use  Smoking Status Former Smoker  . Last attempt to quit: 02/10/1971  . Years since quitting: 57.2  Smokeless Tobacco Never Used  Tobacco Comment   quit smoking 76yr ago    Goals Met:  Independence with exercise equipment Improved SOB with ADL's Using PLB without cueing & demonstrates good technique Exercise tolerated well No report of cardiac concerns or symptoms Strength training completed today  Goals Unmet:  Not Applicable  Comments: Service time is from 1030 to 1235   Dr. WRush Farmeris [facility]

## 2017-04-19 ENCOUNTER — Encounter: Payer: Self-pay | Admitting: Cardiology

## 2017-04-19 DIAGNOSIS — H353133 Nonexudative age-related macular degeneration, bilateral, advanced atrophic without subfoveal involvement: Secondary | ICD-10-CM | POA: Diagnosis not present

## 2017-04-23 ENCOUNTER — Telehealth: Payer: Self-pay

## 2017-04-23 ENCOUNTER — Encounter (HOSPITAL_COMMUNITY)
Admission: RE | Admit: 2017-04-23 | Discharge: 2017-04-23 | Disposition: A | Discharge status: Home / Self Care | Payer: Medicare Other | Source: Ambulatory Visit | Attending: Cardiology | Admitting: Cardiology

## 2017-04-23 ENCOUNTER — Other Ambulatory Visit: Payer: Self-pay

## 2017-04-23 ENCOUNTER — Ambulatory Visit (INDEPENDENT_AMBULATORY_CARE_PROVIDER_SITE_OTHER): Payer: Self-pay

## 2017-04-23 VITALS — Wt 172.8 lb

## 2017-04-23 DIAGNOSIS — Z950 Presence of cardiac pacemaker: Secondary | ICD-10-CM | POA: Insufficient documentation

## 2017-04-23 DIAGNOSIS — I739 Peripheral vascular disease, unspecified: Secondary | ICD-10-CM | POA: Insufficient documentation

## 2017-04-23 DIAGNOSIS — I255 Ischemic cardiomyopathy: Secondary | ICD-10-CM | POA: Diagnosis not present

## 2017-04-23 DIAGNOSIS — I2510 Atherosclerotic heart disease of native coronary artery without angina pectoris: Secondary | ICD-10-CM | POA: Insufficient documentation

## 2017-04-23 DIAGNOSIS — I4892 Unspecified atrial flutter: Secondary | ICD-10-CM | POA: Diagnosis not present

## 2017-04-23 DIAGNOSIS — Z951 Presence of aortocoronary bypass graft: Secondary | ICD-10-CM | POA: Insufficient documentation

## 2017-04-23 DIAGNOSIS — E785 Hyperlipidemia, unspecified: Secondary | ICD-10-CM | POA: Insufficient documentation

## 2017-04-23 DIAGNOSIS — Z87891 Personal history of nicotine dependence: Secondary | ICD-10-CM | POA: Diagnosis not present

## 2017-04-23 DIAGNOSIS — N183 Chronic kidney disease, stage 3 (moderate): Secondary | ICD-10-CM | POA: Insufficient documentation

## 2017-04-23 DIAGNOSIS — Z7901 Long term (current) use of anticoagulants: Secondary | ICD-10-CM | POA: Insufficient documentation

## 2017-04-23 DIAGNOSIS — I130 Hypertensive heart and chronic kidney disease with heart failure and stage 1 through stage 4 chronic kidney disease, or unspecified chronic kidney disease: Secondary | ICD-10-CM | POA: Insufficient documentation

## 2017-04-23 DIAGNOSIS — I5022 Chronic systolic (congestive) heart failure: Secondary | ICD-10-CM | POA: Insufficient documentation

## 2017-04-23 DIAGNOSIS — Z79899 Other long term (current) drug therapy: Secondary | ICD-10-CM | POA: Diagnosis not present

## 2017-04-23 NOTE — Progress Notes (Signed)
EPIC Encounter for ICM Monitoring  Patient Name: Marcus [name] is a 83 y.o. male Date: 04/23/2017 Primary Care Physican: Patient, No Pcp Per Primary Cardiologist:[place] Electrophysiologist: Lovena Le Dry Weight: Previous weight 167 lb  Bi-V Pacing: >99%      Spoke with wife.  Patient is takingTorsemide 1 tablet as needed instead of as prescribed because he is still it is causing his back pain but refuses to see a PCP.  He has told his wife he may not go back to any doctors because he thinks all the medication is making him feel poorly.  Wife tries to get patient to be compliant and see a physician.  She asked what physician should patient see for the back pain.  Advised he really should get PCP to be evaluated and then referred to a specialist if needed.  Advise he can also go to urgent care or ER if needed.    Thoracic impedance continues to be abnormal suggesting fluid accumulation since 04/17/2017.  Prescribed dosage: Torsemide 20 mg take 40 mg (2 tabs) one day alternating with 20 mg (1 tab) the next day continuously. (increased on 11/16)  Labs: 04/17/2017 Creatinine 2.56, BUN 60, Potassium 4.6, Sodium 136, EGFR 21-25 04/05/2017 Creatinine 2.83, BUN 66, Potassium 4.1, Sodium 138, EGFR 19-22 02/08/2017 Creatinine 2.41, BUN 57, Potassium 4.4, Sodium 140, EGFR 23-27 01/09/2017 Creatinine 2.31, BUN 49, Potassium 4.1, Sodium 141, EGFR 24-28 12/05/2016 Creatinine 2.87, BUN 71, Potassium 4.3, Sodium 139, EGFR 19-22 11/22/2016 Creatinine 2.57, BUN 61, Potassium 4.3, Sodium 139, EGFR 21-25  06/14/2016 Creatinine 2.54. BUN 59, Potassium 4.9, Sodium 137, EGFR 22-25 06/01/2016 Creatinine 2.78, BUN 78, Potassium 4.5, Sodium 136, EGFR 19-22  05/22/2016 Creatinine 2.38, BUN 60, Potassium 4.7, Sodium 136, EGFR 23-27 05/08/2016 Creatinine 2.16, BUN 40, Potassium 4.5, Sodium 143, EGFR 26-30 03/26/2016 Creatinine 1.77, BUN 26, Potassium 4.3, Sodium 140, EGFR 33-39 02/21/2016 Creatinine 2.45, BUN  75, Potassium 5.0, Sodium 138, EGFR 23-26  10/05/2016Creatinine 2.29, BUN 49, Potassium 4.8, Sodium 137, EGFR 25-29  Recommendations: Patient is non compliant with Torsemide and refuses to see physician regarding severe back pain.  She reported urine output is good.  Advised to seek assistance in ER if needed.    Follow-up plan: ICM clinic phone appointment on 05/23/2017.  Office appointment scheduled 05/08/2017 with Dr. Aundra Dubin.  Copy of ICM check sent to Dr. Aundra Dubin and Dr. Lovena Le for review and recommendations if needed.   3 month ICM trend: 04/23/2017    1 Year ICM trend:       Rosalene Billings, RN 04/23/2017 8:08 AM

## 2017-04-23 NOTE — Progress Notes (Signed)
Daily Session Note  Patient Details  Name: Marcus [name] MRN: [id number] Date of Birth: 01/12/1929 Referring Provider:     Pulmonary Rehab Walk Test from 02/07/2017 in [place]  Referring Provider  Aundra Dubin      Encounter Date: 04/23/2017  Check In: Session Check In - 04/23/17 1030      Check-In   Location  [facility]    Staff Present  Rosebud Poles, RN, BSN;Molly diVincenzo, MS, ACSM RCEP, Exercise Physiologist;Lisa Ysidro Evert, RN;Tracker Mance Rollene Rotunda, RN, BSN    Supervising physician immediately available to respond to emergencies  [facility] immediately available    Physician(s)  Dr. [place] Crane    Medication changes reported      No    Fall or balance concerns reported     No    Tobacco Cessation  No Change    Warm-up and Cool-down  Performed as group-led instruction    Resistance Training Performed  Yes    VAD Patient?  No      Pain Assessment   Currently in Pain?  No/denies    Multiple Pain Sites  No       Capillary Blood Glucose: No results found for this or any previous visit (from the past 24 hour(s)).  Exercise Prescription Changes - 04/23/17 1231      Response to Exercise   Blood Pressure (Admit)  110/60    Blood Pressure (Exercise)  110/60    Blood Pressure (Exit)  100/56    Heart Rate (Admit)  93 bpm    Heart Rate (Exercise)  87 bpm    Heart Rate (Exit)  73 bpm    Oxygen Saturation (Admit)  99 %    Oxygen Saturation (Exercise)  99 %    Oxygen Saturation (Exit)  98 %    Rating of Perceived Exertion (Exercise)  13    Perceived Dyspnea (Exercise)  2    Duration  Continue with 45 min of aerobic exercise without signs/symptoms of physical distress.    Intensity  THRR unchanged      Progression   Progression  Continue to progress workloads to maintain intensity without signs/symptoms of physical distress.      Resistance Training   Training Prescription  Yes    Weight  blue bands    Reps  10-15    Time  10  Minutes      Bike   Level  5    Minutes  17      NuStep   Level  6    Minutes  17    METs  1.8      Track   Laps  15    Minutes  17       Social History   Tobacco Use  Smoking Status Former Smoker  . Last attempt to quit: 02/10/1971  . Years since quitting: 46.2  Smokeless Tobacco Never Used  Tobacco Comment   quit smoking 30yr ago    Goals Met:  Independence with exercise equipment Improved SOB with ADL's Using PLB without cueing & demonstrates good technique Exercise tolerated well No report of cardiac concerns or symptoms Strength training completed today  Goals Unmet:  Not Applicable  Comments: Service time is from 1030 to 1205   Dr. WRush Farmeris [facility] Director [facility]

## 2017-04-23 NOTE — Telephone Encounter (Signed)
Remote ICM transmission received.  Attempted call to wife and left message per DPR to return call.

## 2017-04-25 ENCOUNTER — Encounter (HOSPITAL_COMMUNITY)
Admission: RE | Admit: 2017-04-25 | Discharge: 2017-04-25 | Disposition: A | Discharge status: Home / Self Care | Payer: Medicare Other | Source: Ambulatory Visit | Attending: Cardiology | Admitting: Cardiology

## 2017-04-25 VITALS — Wt 174.2 lb

## 2017-04-25 DIAGNOSIS — I5022 Chronic systolic (congestive) heart failure: Secondary | ICD-10-CM | POA: Diagnosis not present

## 2017-04-25 DIAGNOSIS — I130 Hypertensive heart and chronic kidney disease with heart failure and stage 1 through stage 4 chronic kidney disease, or unspecified chronic kidney disease: Secondary | ICD-10-CM | POA: Diagnosis not present

## 2017-04-25 DIAGNOSIS — E785 Hyperlipidemia, unspecified: Secondary | ICD-10-CM | POA: Diagnosis not present

## 2017-04-25 DIAGNOSIS — I255 Ischemic cardiomyopathy: Secondary | ICD-10-CM | POA: Diagnosis not present

## 2017-04-25 DIAGNOSIS — N183 Chronic kidney disease, stage 3 (moderate): Secondary | ICD-10-CM | POA: Diagnosis not present

## 2017-04-25 DIAGNOSIS — I739 Peripheral vascular disease, unspecified: Secondary | ICD-10-CM | POA: Diagnosis not present

## 2017-04-25 NOTE — Progress Notes (Signed)
Pulmonary Individual Treatment Plan  Patient Details  Name: Marcus [name] MRN: [id number] Date of Birth: 05-May-1929 Referring Provider:     Pulmonary Rehab Walk Test from 02/07/2017 in [facility]  Referring Provider  Aundra Dubin      Initial Encounter Date:    Pulmonary Rehab Walk Test from 02/07/2017 in [facility]  Date  02/12/17  Referring Provider  Aundra Dubin      Visit Diagnosis: Heart failure, chronic systolic ([place])  Patient's Home Medications on Admission:   Current Outpatient Medications:  .  amiodarone (PACERONE) 200 MG tablet, Take 0.5 tablets (100 mg total) by mouth daily., Disp: 45 tablet, Rfl: 3 .  apixaban (ELIQUIS) 2.5 MG TABS tablet, Take 1 tablet (2.5 mg total) by mouth 2 (two) times daily., Disp: 180 tablet, Rfl: 3 .  carvedilol (COREG) 6.25 MG tablet, Take 1 tablet (6.25 mg total) by mouth 2 (two) times daily with a meal., Disp: 180 tablet, Rfl: 2 .  Coenzyme Q10 (CO Q 10) 100 MG CAPS, Take 1 capsule by mouth 2 (two) times daily., Disp: , Rfl:  .  FLUZONE HIGH-DOSE 0.5 ML injection, , Disp: , Rfl:  .  GLUTATHIONE PO, Take by mouth daily. Take as directed, Disp: , Rfl:  .  isosorbide mononitrate (IMDUR) 30 MG 24 hr tablet, Take 0.5 tablets (15 mg total) by mouth daily., Disp: 45 tablet, Rfl: 3 .  Multiple Vitamins-Minerals (MULTIVITAMINS/MINERALS ADULT PO), Take by mouth daily. MacuGuard, Disp: , Rfl:  .  Multiple Vitamins-Minerals (PRESERVISION AREDS 2 PO), Take 1 capsule by mouth 2 (two) times daily., Disp: , Rfl:  .  PREVNAR 13 SUSP injection, , Disp: , Rfl:  .  spironolactone (ALDACTONE) 25 MG tablet, TAKE ONE-HALF TABLET BY MOUTH ONCE DAILY, Disp: 45 tablet, Rfl: 3 .  torsemide (DEMADEX) 20 MG tablet, Take 40 mg (2 tabs) one day alternating with 20 mg (1 tab) the next day continuously., Disp: 45 tablet, Rfl: 6  Past Medical History: Past Medical History:  Diagnosis Date  . Atrial flutter ([place])    a.  Dx 07/2014, s/p TEE/DCCV.  . Cataracts, bilateral   . Chronic systolic CHF (congestive heart failure) ([place])    a. s/p CRT-D 09/2013. b. Device explant with temp perm then subsequent CRT-P 07/2014.  Marland Kitchen CKD (chronic kidney disease), stage III ([place])   . Coronary artery disease    a. s/p remote CABG x 2(VG->OM, LIMA->LAD;  b. Late 90's s/p PCI of RCA;  c. 12/09 Cath/PCI: LM 43m, 70-80d (3.0x52mm Xience DES), LAD100p, LCX 80-90p (2.25x96mmTaxus Atom DES), RCA 100d (2.5x66mm Xience DES), VG->OM 100, LIMA->LAD nl, EF 30-35%.  Marland Kitchen DVT (deep venous thrombosis) ([place]) 2017   Right knee  . Hard of hearing   . History of colon polyps   . History of gastric ulcer   . History of gout   . History of shingles   . Hyperlipidemia   . Hypertension   . Ischemic cardiomyopathy    a. EF 35-40% by echo 05/2012, b. EF 20-25%, akinesis and scarring of inferolateral, inferior and inferoseptal myocardium, mild AI, mod MR, LA mod dilated, RV mildly dialted and sys fx mildly reduced, RA mildly dilated (09/2013)  c. EF 35-40% by 2D in 07/2014, 25-30% by TEE.  . Macular degeneration, dry   . Pacemaker infection ([place])   . PAF (paroxysmal atrial fibrillation) ([place])   . Peripheral vascular disease ([place])   . Presence of permanent cardiac pacemaker 07/23/2014  Ojus [id number], Serial [id number]  . Prostate cancer ([place])   . Symptomatic bradycardia    a. 08/2003 s/p MDT Enpulse [id number] Dual chamber PPM ser # [id number] H. b. Device explant due to infection/temp perm insertion/PPM 07/2014.    Tobacco Use: Social History   Tobacco Use  Smoking Status Former Smoker  . Last attempt to quit: 02/10/1971  . Years since quitting: 46.2  Smokeless Tobacco Never Used  Tobacco Comment   quit smoking 50yrs ago    Labs: Recent Review Flowsheet Data    Labs for ITP Cardiac and Pulmonary Rehab Latest Ref Rng & Units 07/20/2014 07/21/2014 07/22/2014 03/01/2016 03/01/2016   Cholestrol 0 - 200 mg/dL - - - - -   LDLCALC 0 - 99 mg/dL - -  - - -   HDL >39.00 mg/dL - - - - -   Trlycerides 0.0 - 149.0 mg/dL - - - - -   HCO3 20.0 - 28.0 mmol/L - - - 19.1(L) 18.8(L)   TCO2 0 - 100 mmol/L - - - 20 20   ACIDBASEDEF 0.0 - 2.0 mmol/L - - - 8.0(H) 8.0(H)   O2SAT % 68.7 82.3 68.4 58.0 61.0      Capillary Blood Glucose: Lab Results  Component Value Date   GLUCAP 107 (H) 07/24/2014     Pulmonary Assessment Scores: Pulmonary Assessment Scores    Row Name 02/12/17 0723 02/13/17 1407       ADL UCSD   ADL Phase  Entry  Entry    SOB Score total  -  30      CAT Score   CAT Score  -  13 Entry      mMRC Score   mMRC Score  1  -       Pulmonary Function Assessment: Pulmonary Function Assessment - 02/01/17 1312      Breath   Bilateral Breath Sounds  Clear    Shortness of Breath  Yes;Limiting activity       Exercise Target Goals:    Exercise Program Goal: Individual exercise prescription set with THRR, safety & activity barriers. Participant demonstrates ability to understand and report RPE using BORG scale, to self-measure pulse accurately, and to acknowledge the importance of the exercise prescription.  Exercise Prescription Goal: Starting with aerobic activity 30 plus minutes a day, 3 days per week for initial exercise prescription. Provide home exercise prescription and guidelines that participant acknowledges understanding prior to discharge.  Activity Barriers & Risk Stratification: Activity Barriers & Cardiac Risk Stratification - 02/01/17 1255      Activity Barriers & Cardiac Risk Stratification   Activity Barriers  Decreased Ventricular Function;Balance Concerns       6 Minute Walk: 6 Minute Walk    Row Name 02/12/17 0719         6 Minute Walk   Phase  Initial     Distance  1220 feet     Walk Time  6 minutes     # of Rest Breaks  0     MPH  2.31     METS  2.76     RPE  12     Perceived Dyspnea   1     Symptoms  No     Resting HR  74 bpm     Resting BP  126/76     Resting Oxygen Saturation    95 %     Exercise Oxygen Saturation  during 6 min walk  100 %  Max Ex. HR  97 bpm     Max Ex. BP  133/77       Interval HR   1 Minute HR  78     2 Minute HR  91     3 Minute HR  75     4 Minute HR  96     5 Minute HR  97     6 Minute HR  95     2 Minute Post HR  81     Interval Heart Rate?  Yes       Interval Oxygen   Interval Oxygen?  Yes     Baseline Oxygen Saturation %  95 %     1 Minute Oxygen Saturation %  100 %     1 Minute Liters of Oxygen  0 L     2 Minute Oxygen Saturation %  100 %     2 Minute Liters of Oxygen  0 L     3 Minute Oxygen Saturation %  100 %     3 Minute Liters of Oxygen  0 L     4 Minute Oxygen Saturation %  100 %     4 Minute Liters of Oxygen  0 L     5 Minute Oxygen Saturation %  100 %     5 Minute Liters of Oxygen  0 L     6 Minute Oxygen Saturation %  100 %     6 Minute Liters of Oxygen  0 L     2 Minute Post Oxygen Saturation %  100 %     2 Minute Post Liters of Oxygen  0 L        Oxygen Initial Assessment: Oxygen Initial Assessment - 02/12/17 0723      Initial 6 min Walk   Oxygen Used  None      Program Oxygen Prescription   Program Oxygen Prescription  None       Oxygen Re-Evaluation: Oxygen Re-Evaluation    Row Name 03/10/17 2055 04/01/17 1330 04/23/17 1257         Program Oxygen Prescription   Program Oxygen Prescription  None  None  None       Home Oxygen   Home Oxygen Device  None  None  None     Sleep Oxygen Prescription  None  None  None     Home Exercise Oxygen Prescription  None  None  None     Home at Rest Exercise Oxygen Prescription  None  None  None        Oxygen Discharge (Final Oxygen Re-Evaluation): Oxygen Re-Evaluation - 04/23/17 1257      Program Oxygen Prescription   Program Oxygen Prescription  None      Home Oxygen   Home Oxygen Device  None    Sleep Oxygen Prescription  None    Home Exercise Oxygen Prescription  None    Home at Rest Exercise Oxygen Prescription  None       Initial  Exercise Prescription: Initial Exercise Prescription - 02/12/17 0700      Date of Initial Exercise RX and Referring Provider   Date  02/12/17    Referring Provider  Aundra Dubin      Bike   Level  2 scitfit    Minutes  17      NuStep   Level  2    Minutes  17    METs  1.5  Track   Laps  10    Minutes  17      Prescription Details   Frequency (times per week)  2    Duration  Progress to 45 minutes of aerobic exercise without signs/symptoms of physical distress      Intensity   THRR 40-80% of Max Heartrate  54-108    Ratings of Perceived Exertion  11-13    Perceived Dyspnea  0-4      Progression   Progression  Continue progressive overload as per policy without signs/symptoms or physical distress.      Resistance Training   Training Prescription  Yes    Weight  blue bands    Reps  10-15       Perform Capillary Blood Glucose checks as needed.  Exercise Prescription Changes: Exercise Prescription Changes    Row Name 02/12/17 1200 02/14/17 1200 02/19/17 1200 02/21/17 1200 02/26/17 1300     Response to Exercise   Blood Pressure (Admit)  100/62  108/66  104/60  118/68  110/60   Blood Pressure (Exercise)  138/74  128/66  122/60  120/76  128/78   Blood Pressure (Exit)  102/64  104/72  124/76  120/60  118/70   Heart Rate (Admit)  86 bpm  95 bpm  88 bpm  81 bpm  92 bpm   Heart Rate (Exercise)  102 bpm  87 bpm  94 bpm  94 bpm  95 bpm   Heart Rate (Exit)  80 bpm  70 bpm  74 bpm  69 bpm  75 bpm   Oxygen Saturation (Admit)  100 %  100 %  100 %  100 %  100 %   Oxygen Saturation (Exercise)  98 %  100 %  100 %  97 %  100 %   Oxygen Saturation (Exit)  100 %  99 %  100 %  97 %  100 %   Rating of Perceived Exertion (Exercise)  11  12  13  12  11    Perceived Dyspnea (Exercise)  1  1  2  1  2    Duration  Continue with 45 min of aerobic exercise without signs/symptoms of physical distress.  Continue with 45 min of aerobic exercise without signs/symptoms of physical distress.  Continue  with 45 min of aerobic exercise without signs/symptoms of physical distress.  Continue with 45 min of aerobic exercise without signs/symptoms of physical distress.  Continue with 45 min of aerobic exercise without signs/symptoms of physical distress.   Intensity  THRR unchanged  THRR unchanged  THRR unchanged  THRR unchanged  THRR unchanged     Resistance Training   Training Prescription  Yes  Yes  Yes  Yes  Yes   Weight  blue bands  blue bands  blue bands  blue bands  blue bands   Reps  10-15  10-15  10-15  10-15  10-15   Time  -  -  -  -  10 Minutes     Bike   Level  2 scitfit  -  2  3  3    Minutes  17  -  17  17  17      NuStep   Level  2  2  2  3  3    Minutes  17  17  17  17  17    METs  2  2.2  2.2  2  2      Track   Laps  11  10  10  -  -   Minutes  17  17  17   -  -   Row Name 02/28/17 1200 03/05/17 1200 03/07/17 1200 03/12/17 1212 03/14/17 1200     Response to Exercise   Blood Pressure (Admit)  100/56  106/66  114/64  110/64  94/64   Blood Pressure (Exercise)  100/62  118/70  118/70  124/76  120/66   Blood Pressure (Exit)  110/60  102/70  114/64  108/60  104/62   Heart Rate (Admit)  76 bpm  76 bpm  78 bpm  91 bpm  89 bpm   Heart Rate (Exercise)  80 bpm  88 bpm  86 bpm  97 bpm  74 bpm   Heart Rate (Exit)  70 bpm  70 bpm  73 bpm  69 bpm  75 bpm   Oxygen Saturation (Admit)  98 %  99 %  100 %  98 %  100 %   Oxygen Saturation (Exercise)  100 %  97 %  100 %  93 %  98 %   Oxygen Saturation (Exit)  100 %  100 %  100 %  99 %  100 %   Rating of Perceived Exertion (Exercise)  13  13  13  13  11    Perceived Dyspnea (Exercise)  1  2  2  1  1    Duration  Continue with 45 min of aerobic exercise without signs/symptoms of physical distress.  Continue with 45 min of aerobic exercise without signs/symptoms of physical distress.  Continue with 45 min of aerobic exercise without signs/symptoms of physical distress.  Continue with 45 min of aerobic exercise without signs/symptoms of physical  distress.  Continue with 45 min of aerobic exercise without signs/symptoms of physical distress.   Intensity  THRR unchanged  THRR unchanged  THRR unchanged  THRR unchanged  THRR unchanged     Resistance Training   Training Prescription  Yes  Yes  Yes  Yes  Yes   Weight  blue bands  blue bands  blue bands  blue bands  blue bands   Reps  10-15  10-15  10-15  10-15  10-15   Time  10 Minutes  10 Minutes  10 Minutes  10 Minutes  10 Minutes     Bike   Level  2  4  4   4.5  -   Minutes  17  17  17  17   -     NuStep   Level  3  4  -  4  4   Minutes  17  17  -  17  17   METs  1.5  2.7  -  2.1  2.5     Track   Laps  10  9  9  10  8    Minutes  17  17  17  17  17      Home Exercise Plan   Plans to continue exercise at  -  -  -  [employer] (comment) [place]   Frequency  -  -  -  Add 2 additional days to program exercise sessions.  -   Row Name 03/19/17 1200 03/21/17 1200 03/26/17 1200 03/28/17 1300 04/02/17 1200     Response to Exercise   Blood Pressure (Admit)  100/56  104/60  104/60  126/70  100/58   Blood Pressure (Exercise)  140/70  128/72  118/63  130/70  124/64   Blood Pressure (Exit)  102/64  104/64  102/60  100/60  98/60   Heart Rate (Admit)  93 bpm  82 bpm  83 bpm  89 bpm  74 bpm   Heart Rate (Exercise)  91 bpm  89 bpm  90 bpm  85 bpm  79 bpm   Heart Rate (Exit)  79 bpm  70 bpm  70 bpm  65 bpm  71 bpm   Oxygen Saturation (Admit)  98 %  99 %  99 %  96 %  99 %   Oxygen Saturation (Exercise)  97 %  99 %  98 %  93 %  100 %   Oxygen Saturation (Exit)  100 %  99 %  99 %  99 %  99 %   Rating of Perceived Exertion (Exercise)  14  15  12  11  11    Perceived Dyspnea (Exercise)  2  1  1   0  0   Duration  Continue with 45 min of aerobic exercise without signs/symptoms of physical distress.  Continue with 45 min of aerobic exercise without signs/symptoms of physical distress.  Continue with 45 min of aerobic exercise without signs/symptoms of physical distress.   Continue with 45 min of aerobic exercise without signs/symptoms of physical distress.  Continue with 45 min of aerobic exercise without signs/symptoms of physical distress.   Intensity  THRR unchanged  THRR unchanged  THRR unchanged  THRR unchanged  THRR unchanged     Resistance Training   Training Prescription  Yes  Yes  Yes  Yes  Yes   Weight  blue bands  blue bands  blue bands  blue bands  blue bands   Reps  10-15  10-15  10-15  10-15  10-15   Time  10 Minutes  10 Minutes  10 Minutes  10 Minutes  10 Minutes     Bike   Level  4.5  4.5  4.7  -  4.7   Minutes  17  17  17   -  17     NuStep   Level  4  -  4  4  4    Minutes  17  -  17  17  17    METs  2.4  -  2.8  2.8  2.2     Track   Laps  7  11  13  10  16    Minutes  17  17  17  17  17    Row Name 04/04/17 1200 04/09/17 1200 04/16/17 1200 04/18/17 1332 04/23/17 1231     Response to Exercise   Blood Pressure (Admit)  98/50  112/34  104/54  100/54  110/60   Blood Pressure (Exercise)  112/64  120/64  124/74  132/70  110/60   Blood Pressure (Exit)  104/60  116/58  92/60 drank H2O recheck BP 100/60  102/64  100/56   Heart Rate (Admit)  74 bpm  77 bpm  84 bpm  74 bpm  93 bpm   Heart Rate (Exercise)  92 bpm  86 bpm  95 bpm  90 bpm  87 bpm   Heart Rate (Exit)  71 bpm  69 bpm  78 bpm  70 bpm  73 bpm   Oxygen Saturation (Admit)  98 %  100 %  98 %  99 %  99 %   Oxygen Saturation (Exercise)  99 %  99 %  99 %  100 %  99 %  Oxygen Saturation (Exit)  99 %  100 %  100 %  99 %  98 %   Rating of Perceived Exertion (Exercise)  10  11  11  11  13    Perceived Dyspnea (Exercise)  0  2  2  1  2    Duration  Continue with 45 min of aerobic exercise without signs/symptoms of physical distress.  Continue with 45 min of aerobic exercise without signs/symptoms of physical distress.  Continue with 45 min of aerobic exercise without signs/symptoms of physical distress.  Continue with 45 min of aerobic exercise without signs/symptoms of physical distress.  Continue  with 45 min of aerobic exercise without signs/symptoms of physical distress.   Intensity  THRR unchanged  THRR unchanged  THRR unchanged  THRR unchanged  THRR unchanged     Progression   Progression  -  Continue to progress workloads to maintain intensity without signs/symptoms of physical distress.  Continue to progress workloads to maintain intensity without signs/symptoms of physical distress.  Continue to progress workloads to maintain intensity without signs/symptoms of physical distress.  Continue to progress workloads to maintain intensity without signs/symptoms of physical distress.     Resistance Training   Training Prescription  Yes  Yes  Yes  Yes  Yes   Weight  blue bands  blue bands  blue bands  blue bands  blue bands   Reps  10-15  10-15  10-15  10-15  10-15   Time  10 Minutes  10 Minutes  10 Minutes  10 Minutes  10 Minutes     Bike   Level  4.7  4.7  4.7  4.7  5   Minutes  17  17  17  17  17      NuStep   Level  4  4  5   -  6   Minutes  17  17  17   -  17   METs  1.9  2.2  1.9  -  1.8     Track   Laps  -  16  15  16  15    Minutes  -  17  17  17  17       Exercise Comments: Exercise Comments    Row Name 03/14/17 0720           Exercise Comments  Home exercise completed          Exercise Goals and Review: Exercise Goals    Row Name 02/01/17 1256             Exercise Goals   Increase Physical Activity  Yes       Intervention  Provide advice, education, support and counseling about physical activity/exercise needs.;Develop an individualized exercise prescription for aerobic and resistive training based on initial evaluation findings, risk stratification, comorbidities and participant's personal goals.       Expected Outcomes  Achievement of increased cardiorespiratory fitness and enhanced flexibility, muscular endurance and strength shown through measurements of functional capacity and personal statement of participant.       Increase Strength and Stamina  Yes        Intervention  Provide advice, education, support and counseling about physical activity/exercise needs.;Develop an individualized exercise prescription for aerobic and resistive training based on initial evaluation findings, risk stratification, comorbidities and participant's personal goals.       Expected Outcomes  Achievement of increased cardiorespiratory fitness and enhanced flexibility, muscular endurance and strength shown through measurements of functional capacity and  personal statement of participant.       Able to understand and use rate of perceived exertion (RPE) scale  Yes       Intervention  Provide education and explanation on how to use RPE scale       Expected Outcomes  Short Term: Able to use RPE daily in rehab to express subjective intensity level;Long Term:  Able to use RPE to guide intensity level when exercising independently       Able to understand and use Dyspnea scale  Yes       Intervention  Provide education and explanation on how to use Dyspnea scale       Expected Outcomes  Short Term: Able to use Dyspnea scale daily in rehab to express subjective sense of shortness of breath during exertion;Long Term: Able to use Dyspnea scale to guide intensity level when exercising independently       Knowledge and understanding of Target Heart Rate Range (THRR)  Yes       Intervention  Provide education and explanation of THRR including how the numbers were predicted and where they are located for reference       Expected Outcomes  Short Term: Able to state/look up THRR;Long Term: Able to use THRR to govern intensity when exercising independently;Short Term: Able to use daily as guideline for intensity in rehab       Understanding of Exercise Prescription  Yes       Intervention  Provide education, explanation, and written materials on patient's individual exercise prescription       Expected Outcomes  Short Term: Able to explain program exercise prescription;Long Term: Able to  explain home exercise prescription to exercise independently          Exercise Goals Re-Evaluation : Exercise Goals Re-Evaluation    [place] Name 03/11/17 1008 04/04/17 0945 04/23/17 0800         Exercise Goal Re-Evaluation   Exercise Goals Review  Increase Physical Activity;Increase Strength and Stamina;Able to understand and use Dyspnea scale;Able to understand and use rate of perceived exertion (RPE) scale;Knowledge and understanding of Target Heart Rate Range (THRR);Understanding of Exercise Prescription  Increase Strength and Stamina;Increase Physical Activity;Able to understand and use rate of perceived exertion (RPE) scale;Able to understand and use Dyspnea scale;Knowledge and understanding of Target Heart Rate Range (THRR);Understanding of Exercise Prescription  Increase Strength and Stamina;Increase Physical Activity;Able to understand and use rate of perceived exertion (RPE) scale;Able to understand and use Dyspnea scale;Knowledge and understanding of Target Heart Rate Range (THRR);Understanding of Exercise Prescription     Comments  Patient is progressing well in program. Has a great attitude. Willing to work at his higher threshold. Will cont. to monitor and progress as able.   Patient is progressing well in program. Has a great attitude. Willing to work at his higher threshold. Will cont. to monitor and progress as able.   Patient is progressing well in program. Has a great attitude. Willing to work at his higher threshold. C/O of chronic pain in hip with walking that affects his balance--started using a rollator while walking on the track. This makes him feel more comfortable. Will cont. to monitor and progress as able.      Expected Outcomes  Through exercise at rehab and at home, patient will increase strength and stamina and decrease fatigue.  Through exercise at rehab and at home, patient will increase strength and stamina and also be able to perform ADL's easier.  Through exercise at  rehab and at home, patient will increase strength and stamina and understand how to safely exercise at home.         Discharge Exercise Prescription (Final Exercise Prescription Changes): Exercise Prescription Changes - 04/23/17 1231      Response to Exercise   Blood Pressure (Admit)  110/60    Blood Pressure (Exercise)  110/60    Blood Pressure (Exit)  100/56    Heart Rate (Admit)  93 bpm    Heart Rate (Exercise)  87 bpm    Heart Rate (Exit)  73 bpm    Oxygen Saturation (Admit)  99 %    Oxygen Saturation (Exercise)  99 %    Oxygen Saturation (Exit)  98 %    Rating of Perceived Exertion (Exercise)  13    Perceived Dyspnea (Exercise)  2    Duration  Continue with 45 min of aerobic exercise without signs/symptoms of physical distress.    Intensity  THRR unchanged      Progression   Progression  Continue to progress workloads to maintain intensity without signs/symptoms of physical distress.      Resistance Training   Training Prescription  Yes    Weight  blue bands    Reps  10-15    Time  10 Minutes      Bike   Level  5    Minutes  17      NuStep   Level  6    Minutes  17    METs  1.8      Track   Laps  15    Minutes  17       Nutrition:  Target Goals: Understanding of nutrition guidelines, daily intake of sodium 1500mg , cholesterol 200mg , calories 30% from fat and 7% or less from saturated fats, daily to have 5 or more servings of fruits and vegetables.  Biometrics: Pre Biometrics - 02/01/17 1257      Pre Biometrics   Grip Strength  36 kg        Nutrition Therapy Plan and Nutrition Goals: Nutrition Therapy & Goals - 02/04/17 1206      Nutrition Therapy   Diet  Low Sodium      Personal Nutrition Goals   Nutrition Goal  The pt will recognize symptoms that can interfere with adequate oral intake, such as shortness of breath, N/V, early satiety, fatigue, ability to secure and prepare food, taste and smell changes, chewing/swallowing difficulties, and/ or  pain when eating.    Personal Goal #2  Identify food quantities necessary to achieve wt loss of  -2# per week to a goal wt loss of 6-10 lb at graduation from pulmonary rehab.      Intervention Plan   Intervention  Prescribe, educate and counsel regarding individualized specific dietary modifications aiming towards targeted core components such as weight, hypertension, lipid management, diabetes, heart failure and other comorbidities.    Expected Outcomes  Short Term Goal: Understand basic principles of dietary content, such as calories, fat, sodium, cholesterol and nutrients.;Long Term Goal: Adherence to prescribed nutrition plan.       Nutrition Discharge: Rate Your Plate Scores: Nutrition Assessments - 02/04/17 1206      Rate Your Plate Scores   Pre Score  49       Nutrition Goals Re-Evaluation:   Nutrition Goals Discharge (Final Nutrition Goals Re-Evaluation):   Psychosocial: Target Goals: Acknowledge presence or absence of significant depression and/or stress, maximize coping skills, provide positive support system. Participant is  able to verbalize types and ability to use techniques and skills needed for reducing stress and depression.  Initial Review & Psychosocial Screening: Initial Psych Review & Screening - 02/01/17 1313      Initial Review   Current issues with  None Identified      Family Dynamics   Good Support System?  Yes      Barriers   Psychosocial barriers to participate in program  There are no identifiable barriers or psychosocial needs.      Screening Interventions   Interventions  Encouraged to exercise       Quality of Life Scores:   PHQ-9: Recent Review Flowsheet Data    Depression screen [facility] 2/9 02/01/2017   Decreased Interest 0   Down, Depressed, Hopeless 0   PHQ - 2 Score 0     Interpretation of Total Score  Total Score Depression Severity:  1-4 = Minimal depression, 5-9 = Mild depression, 10-14 = Moderate depression, 15-19 =  Moderately severe depression, 20-27 = Severe depression   Psychosocial Evaluation and Intervention: Psychosocial Evaluation - 02/01/17 1314      Psychosocial Evaluation & Interventions   Interventions  Encouraged to exercise with the program and follow exercise prescription    Expected Outcomes  patient will remain free from psychosocial barriers to participation in pulmonary rehab.    Continue Psychosocial Services   No Follow up required       Psychosocial Re-Evaluation: Psychosocial Re-Evaluation    Murray Name 03/10/17 2059 04/01/17 1333 04/23/17 1259         Psychosocial Re-Evaluation   Current issues with  None Identified  None Identified  None Identified     Expected Outcomes  patient will remain free from psychosocial barriers to participation in pulmonary rehab  patient will remain free from psychosocial barriers to participation in pulmonary rehab  patient will remain free from psychosocial barriers to participation in pulmonary rehab     Interventions  Encouraged to attend Pulmonary Rehabilitation for the exercise  Encouraged to attend Pulmonary Rehabilitation for the exercise  Encouraged to attend Pulmonary Rehabilitation for the exercise     Continue Psychosocial Services   No Follow up required  No Follow up required  No Follow up required        Psychosocial Discharge (Final Psychosocial Re-Evaluation): Psychosocial Re-Evaluation - 04/23/17 1259      Psychosocial Re-Evaluation   Current issues with  None Identified    Expected Outcomes  patient will remain free from psychosocial barriers to participation in pulmonary rehab    Interventions  Encouraged to attend Pulmonary Rehabilitation for the exercise    Continue Psychosocial Services   No Follow up required       Education: Education Goals: Education classes will be provided on a weekly basis, covering required topics. Participant will state understanding/return demonstration of topics presented.  Learning  Barriers/Preferences: Learning Barriers/Preferences - 02/01/17 1311      Learning Barriers/Preferences   Learning Barriers  None    Learning Preferences  Written Material;Group Instruction;Verbal Instruction;Skilled Demonstration       Education Topics: Risk Factor Reduction:  -Group instruction that is supported by a PowerPoint presentation. Instructor discusses the definition of a risk factor, different risk factors for pulmonary disease, and how the heart and lungs work together.     Nutrition for Pulmonary Patient:  -Group instruction provided by PowerPoint slides, verbal discussion, and written materials to support subject matter. The instructor gives an explanation and review of healthy diet recommendations,  which includes a discussion on weight management, recommendations for fruit and vegetable consumption, as well as protein, fluid, caffeine, fiber, sodium, sugar, and alcohol. Tips for eating when patients are short of breath are discussed.   PULMONARY REHAB OTHER RESPIRATORY from 04/18/2017 in [place]  Date  03/07/17  Educator  RD  Instruction Review Code  2- meets goals/outcomes      Pursed Lip Breathing:  -Group instruction that is supported by demonstration and informational handouts. Instructor discusses the benefits of pursed lip and diaphragmatic breathing and detailed demonstration on how to preform both.     Oxygen Safety:  -Group instruction provided by PowerPoint, verbal discussion, and written material to support subject matter. There is an overview of "What is Oxygen" and "Why do we need it".  Instructor also reviews how to create a safe environment for oxygen use, the importance of using oxygen as prescribed, and the risks of noncompliance. There is a brief discussion on traveling with oxygen and resources the patient may utilize.   PULMONARY REHAB OTHER RESPIRATORY from 04/18/2017 in [place]   Date  03/14/17  Educator  Tekila Caillouet  Instruction Review Code  2- meets goals/outcomes      Oxygen Equipment:  -Group instruction provided by [employer] Staff utilizing handouts, written materials, and equipment demonstrations.   PULMONARY REHAB OTHER RESPIRATORY from 04/18/2017 in [place]  Date  04/18/17  Educator  [facility]  Instruction Review Code  2- meets goals/outcomes      Signs and Symptoms:  -Group instruction provided by written material and verbal discussion to support subject matter. Warning signs and symptoms of infection, stroke, and heart attack are reviewed and when to call the physician/911 reinforced. Tips for preventing the spread of infection discussed.   Advanced Directives:  -Group instruction provided by verbal instruction and written material to support subject matter. Instructor reviews Advanced Directive laws and proper instruction for filling out document.   Pulmonary Video:  -Group video education that reviews the importance of medication and oxygen compliance, exercise, good nutrition, pulmonary hygiene, and pursed lip and diaphragmatic breathing for the pulmonary patient.   PULMONARY REHAB OTHER RESPIRATORY from 04/18/2017 in [place]  Date  02/14/17  Instruction Review Code  2- meets goals/outcomes      Exercise for the Pulmonary Patient:  -Group instruction that is supported by a PowerPoint presentation. Instructor discusses benefits of exercise, core components of exercise, frequency, duration, and intensity of an exercise routine, importance of utilizing pulse oximetry during exercise, safety while exercising, and options of places to exercise outside of rehab.     PULMONARY REHAB OTHER RESPIRATORY from 04/18/2017 in [place]  Date  04/04/17  Educator  Cloyde Reams  Instruction Review Code  2- meets goals/outcomes      Pulmonary Medications:   -Verbally interactive group education provided by instructor with focus on inhaled medications and proper administration.   PULMONARY REHAB OTHER RESPIRATORY from 04/18/2017 in [place]  Date  03/21/17  Educator  Pharm D  Instruction Review Code  2- meets goals/outcomes      Anatomy and Physiology of the Respiratory System and Intimacy:  -Group instruction provided by PowerPoint, verbal discussion, and written material to support subject matter. Instructor reviews respiratory cycle and anatomical components of the respiratory system and their functions. Instructor also reviews differences in obstructive and restrictive respiratory diseases with  examples of each. Intimacy, Sex, and Sexuality differences are reviewed with a discussion on how relationships can change when diagnosed with pulmonary disease. Common sexual concerns are reviewed.   MD DAY -A group question and answer session with a medical doctor that allows participants to ask questions that relate to their pulmonary disease state.   PULMONARY REHAB OTHER RESPIRATORY from 04/18/2017 in [place]  Date  02/26/17  Educator  yacoub  Instruction Review Code  2- meets goals/outcomes      OTHER EDUCATION -Group or individual verbal, written, or video instructions that support the educational goals of the pulmonary rehab program.   PULMONARY REHAB OTHER RESPIRATORY from 04/18/2017 in [place]  Date  03/28/17 [DGUYQIH Eating]  Educator  RD  Instruction Review Code  1- Verbalizes Understanding      Knowledge Questionnaire Score: Knowledge Questionnaire Score - 02/13/17 1406      Knowledge Questionnaire Score   Pre Score  12/13       Core Components/Risk Factors/Patient Goals at Admission: Personal Goals and Risk Factors at Admission - 02/01/17 1312      Core Components/Risk Factors/Patient Goals on Admission   Improve  shortness of breath with ADL's  Yes    Intervention  Provide education, individualized exercise plan and daily activity instruction to help decrease symptoms of SOB with activities of daily living.    Expected Outcomes  Short Term: Achieves a reduction of symptoms when performing activities of daily living.    Develop more efficient breathing techniques such as purse lipped breathing and diaphragmatic breathing; and practicing self-pacing with activity  Yes    Intervention  Provide education, demonstration and support about specific breathing techniuqes utilized for more efficient breathing. Include techniques such as pursed lipped breathing, diaphragmatic breathing and self-pacing activity.    Expected Outcomes  Short Term: Participant will be able to demonstrate and use breathing techniques as needed throughout daily activities.    Heart Failure  Yes    Intervention  Provide a combined exercise and nutrition program that is supplemented with education, support and counseling about heart failure. Directed toward relieving symptoms such as shortness of breath, decreased exercise tolerance, and extremity edema.    Expected Outcomes  Improve functional capacity of life;Short term: Attendance in program 2-3 days a week with increased exercise capacity. Reported lower sodium intake. Reported increased fruit and vegetable intake. Reports medication compliance.;Short term: Daily weights obtained and reported for increase. Utilizing diuretic protocols set by physician.;Long term: Adoption of self-care skills and reduction of barriers for early signs and symptoms recognition and intervention leading to self-care maintenance.       Core Components/Risk Factors/Patient Goals Review:  Goals and Risk Factor Review    Row Name 03/10/17 2055 04/01/17 1330 04/23/17 1257         Core Components/Risk Factors/Patient Goals Review   Personal Goals Review  Improve shortness of breath with ADL's;Develop more efficient  breathing techniques such as purse lipped breathing and diaphragmatic breathing and practicing self-pacing with activity.;Heart Failure  Improve shortness of breath with ADL's;Develop more efficient breathing techniques such as purse lipped breathing and diaphragmatic breathing and practicing self-pacing with activity.;Heart Failure  Improve shortness of breath with ADL's;Develop more efficient breathing techniques such as purse lipped breathing and diaphragmatic breathing and practicing self-pacing with activity.;Heart Failure     Review  patient is doing well in pulmonary rehab. he is tolerating increases in workloads and has been observed using PLB  as recommended. he continues to verbalize compliance with daily weights at home and his weight does not show a 3 lb wieght gain from one pulmonary session to another. He verbalizes some improvement in his shortness of breath.   patient continues to do well in pulmonary rehab. he continues to tolerate workload increases and continues to utilize PLB. his weight remains stable and does not show any signs of fluid overload. he verbalizes compliance with heart failure recomendations at home such as daily weights and diet compliance. he states he has gotten physically stronger during his stay in pulmonary rehab.  patient continues to tolerate workloads increases at each exercise session. He works really hard to keep his RPE between 45 and 13. His weight remains stable and he states he definitely sees an improvement in his shortness of breath with ADLs. He is scheduled to graduate over the next 30 days.     Expected Outcomes  see admission expected outcomes  see admission expected outcomes  see admission expected outcomes        Core Components/Risk Factors/Patient Goals at Discharge (Final Review):  Goals and Risk Factor Review - 04/23/17 1257      Core Components/Risk Factors/Patient Goals Review   Personal Goals Review  Improve shortness of breath with  ADL's;Develop more efficient breathing techniques such as purse lipped breathing and diaphragmatic breathing and practicing self-pacing with activity.;Heart Failure    Review  patient continues to tolerate workloads increases at each exercise session. He works really hard to keep his RPE between 35 and 13. His weight remains stable and he states he definitely sees an improvement in his shortness of breath with ADLs. He is scheduled to graduate over the next 30 days.    Expected Outcomes  see admission expected outcomes       ITP Comments:   Comments: patient has attended 20 sessions since admission

## 2017-04-25 NOTE — Progress Notes (Signed)
Daily Session Note  Patient Details  Name: Marcus [name] MRN: [id number] Date of Birth: Jan 30, 1929 Referring Provider:     Pulmonary Rehab Walk Test from 02/07/2017 in [place]  Referring Provider  Aundra Dubin      Encounter Date: 04/25/2017  Check In: Session Check In - 04/25/17 1030      Check-In   Location  [facility]    Staff Present  Rosebud Poles, RN, BSN;Molly diVincenzo, MS, ACSM RCEP, Exercise Physiologist;Lisa Ysidro Evert, RN;Portia Rollene Rotunda, RN, BSN    Supervising physician immediately available to respond to emergencies  [facility] immediately available    Physician(s)  Dr. Starla Link    Medication changes reported      No    Fall or balance concerns reported     No    Tobacco Cessation  No Change    Warm-up and Cool-down  Performed as group-led instruction    Resistance Training Performed  Yes    VAD Patient?  No      Pain Assessment   Currently in Pain?  No/denies    Multiple Pain Sites  No       Capillary Blood Glucose: No results found for this or any previous visit (from the past 24 hour(s)).  Exercise Prescription Changes - 04/25/17 1200      Response to Exercise   Blood Pressure (Admit)  126/82    Blood Pressure (Exercise)  110/60    Blood Pressure (Exit)  106/56    Heart Rate (Admit)  91 bpm    Heart Rate (Exercise)  101 bpm    Heart Rate (Exit)  73 bpm    Oxygen Saturation (Admit)  100 %    Oxygen Saturation (Exercise)  95 %    Oxygen Saturation (Exit)  100 %    Rating of Perceived Exertion (Exercise)  13    Perceived Dyspnea (Exercise)  1    Duration  Continue with 45 min of aerobic exercise without signs/symptoms of physical distress.    Intensity  THRR unchanged      Progression   Progression  Continue to progress workloads to maintain intensity without signs/symptoms of physical distress.      Resistance Training   Training Prescription  Yes    Weight  blue bands    Reps  10-15    Time  10  Minutes      Bike   Level  5    Minutes  17      Track   Laps  14    Minutes  17       Social History   Tobacco Use  Smoking Status Former Smoker  . Last attempt to quit: 02/10/1971  . Years since quitting: 46.2  Smokeless Tobacco Never Used  Tobacco Comment   quit smoking 36yr ago    Goals Met:  Exercise tolerated well Strength training completed today  Goals Unmet:  Not Applicable  Comments: Service time is from 1030 to 1230    Dr. WRush Farmeris Medical Director [facility] Pul[facility] Re[facility] at [facility]

## 2017-04-26 NOTE — Progress Notes (Signed)
He should take torsemide as prescribed.  As he has no PCP, may be fastest to go to urgent care regarding back pain, can get plain films.

## 2017-04-28 LAB — CUP PACEART REMOTE DEVICE CHECK
Battery Remaining Percentage: 95.5 %
Battery Voltage: 2.96 V
Brady Statistic AP VP Percent: 93 %
Brady Statistic AS VP Percent: 6.9 %
Implantable Lead Implant Date: 20160304
Implantable Lead Implant Date: 20160304
Lead Channel Impedance Value: 530 Ohm
Lead Channel Pacing Threshold Amplitude: 1.25 V
Lead Channel Sensing Intrinsic Amplitude: 1.2 mV
Lead Channel Setting Pacing Amplitude: 2 V
Lead Channel Setting Pacing Amplitude: 2.125
Lead Channel Setting Pacing Pulse Width: 0.6 ms
MDC IDC LEAD IMPLANT DT: 20160304
MDC IDC LEAD LOCATION: 753858
MDC IDC LEAD LOCATION: 753859
MDC IDC LEAD LOCATION: 753860
MDC IDC MSMT BATTERY REMAINING LONGEVITY: 69 mo
MDC IDC MSMT LEADCHNL LV IMPEDANCE VALUE: 930 Ohm
MDC IDC MSMT LEADCHNL LV PACING THRESHOLD AMPLITUDE: 1 V
MDC IDC MSMT LEADCHNL LV PACING THRESHOLD PULSEWIDTH: 0.6 ms
MDC IDC MSMT LEADCHNL RA IMPEDANCE VALUE: 480 Ohm
MDC IDC MSMT LEADCHNL RA PACING THRESHOLD PULSEWIDTH: 1 ms
MDC IDC MSMT LEADCHNL RV PACING THRESHOLD AMPLITUDE: 1.125 V
MDC IDC MSMT LEADCHNL RV PACING THRESHOLD PULSEWIDTH: 0.5 ms
MDC IDC MSMT LEADCHNL RV SENSING INTR AMPL: 12 mV
MDC IDC PG IMPLANT DT: 20160304
MDC IDC PG SERIAL: 7725037
MDC IDC SESS DTM: 20181126070012
MDC IDC SET LEADCHNL RA PACING AMPLITUDE: 2.5 V
MDC IDC SET LEADCHNL RV PACING PULSEWIDTH: 0.5 ms
MDC IDC SET LEADCHNL RV SENSING SENSITIVITY: 5 mV
MDC IDC STAT BRADY AP VS PERCENT: 1 %
MDC IDC STAT BRADY AS VS PERCENT: 1 %
MDC IDC STAT BRADY RA PERCENT PACED: 92 %

## 2017-04-30 ENCOUNTER — Encounter (HOSPITAL_COMMUNITY): Payer: Medicare Other

## 2017-05-02 ENCOUNTER — Encounter (HOSPITAL_COMMUNITY): Payer: Medicare Other

## 2017-05-07 ENCOUNTER — Encounter (HOSPITAL_COMMUNITY)
Admission: RE | Admit: 2017-05-07 | Discharge: 2017-05-07 | Disposition: A | Discharge status: Home / Self Care | Payer: Medicare Other | Source: Ambulatory Visit | Attending: Cardiology | Admitting: Cardiology

## 2017-05-07 DIAGNOSIS — I739 Peripheral vascular disease, unspecified: Secondary | ICD-10-CM | POA: Diagnosis not present

## 2017-05-07 DIAGNOSIS — N183 Chronic kidney disease, stage 3 (moderate): Secondary | ICD-10-CM | POA: Diagnosis not present

## 2017-05-07 DIAGNOSIS — E785 Hyperlipidemia, unspecified: Secondary | ICD-10-CM | POA: Diagnosis not present

## 2017-05-07 DIAGNOSIS — I255 Ischemic cardiomyopathy: Secondary | ICD-10-CM | POA: Diagnosis not present

## 2017-05-07 DIAGNOSIS — I5022 Chronic systolic (congestive) heart failure: Secondary | ICD-10-CM | POA: Diagnosis not present

## 2017-05-07 DIAGNOSIS — I130 Hypertensive heart and chronic kidney disease with heart failure and stage 1 through stage 4 chronic kidney disease, or unspecified chronic kidney disease: Secondary | ICD-10-CM | POA: Diagnosis not present

## 2017-05-08 ENCOUNTER — Encounter (HOSPITAL_COMMUNITY): Payer: Medicare Other | Admitting: Cardiology

## 2017-05-09 ENCOUNTER — Encounter (HOSPITAL_COMMUNITY): Payer: Medicare Other

## 2017-05-16 ENCOUNTER — Encounter (HOSPITAL_COMMUNITY): Payer: Medicare Other

## 2017-05-23 ENCOUNTER — Encounter (HOSPITAL_COMMUNITY): Payer: Self-pay | Admitting: *Deleted

## 2017-05-23 ENCOUNTER — Encounter (HOSPITAL_COMMUNITY): Payer: Medicare Other

## 2017-05-23 ENCOUNTER — Telehealth: Payer: Self-pay | Admitting: Cardiology

## 2017-05-23 NOTE — Telephone Encounter (Signed)
LMOVM reminding pt to send remote transmission.   

## 2017-05-28 ENCOUNTER — Encounter (HOSPITAL_COMMUNITY): Payer: Medicare Other

## 2017-05-30 ENCOUNTER — Encounter (HOSPITAL_COMMUNITY): Payer: Medicare Other

## 2017-05-31 ENCOUNTER — Other Ambulatory Visit: Payer: Self-pay

## 2017-05-31 ENCOUNTER — Encounter (HOSPITAL_COMMUNITY): Payer: Self-pay | Admitting: Cardiology

## 2017-05-31 ENCOUNTER — Ambulatory Visit (HOSPITAL_BASED_OUTPATIENT_CLINIC_OR_DEPARTMENT_OTHER)
Admission: RE | Admit: 2017-05-31 | Discharge: 2017-05-31 | Disposition: A | Discharge status: Home / Self Care | Payer: Medicare Other | Source: Ambulatory Visit | Attending: Cardiology | Admitting: Cardiology

## 2017-05-31 ENCOUNTER — Ambulatory Visit (HOSPITAL_COMMUNITY)
Admission: RE | Admit: 2017-05-31 | Discharge: 2017-05-31 | Disposition: A | Discharge status: Home / Self Care | Payer: Medicare Other | Source: Ambulatory Visit | Attending: Cardiology | Admitting: Cardiology

## 2017-05-31 VITALS — BP 117/76 | HR 75 | Wt 173.8 lb

## 2017-05-31 DIAGNOSIS — I495 Sick sinus syndrome: Secondary | ICD-10-CM | POA: Insufficient documentation

## 2017-05-31 DIAGNOSIS — Z95810 Presence of automatic (implantable) cardiac defibrillator: Secondary | ICD-10-CM | POA: Insufficient documentation

## 2017-05-31 DIAGNOSIS — I051 Rheumatic mitral insufficiency: Secondary | ICD-10-CM | POA: Diagnosis not present

## 2017-05-31 DIAGNOSIS — I255 Ischemic cardiomyopathy: Secondary | ICD-10-CM | POA: Diagnosis not present

## 2017-05-31 DIAGNOSIS — I130 Hypertensive heart and chronic kidney disease with heart failure and stage 1 through stage 4 chronic kidney disease, or unspecified chronic kidney disease: Secondary | ICD-10-CM | POA: Diagnosis not present

## 2017-05-31 DIAGNOSIS — E785 Hyperlipidemia, unspecified: Secondary | ICD-10-CM | POA: Insufficient documentation

## 2017-05-31 DIAGNOSIS — Z79899 Other long term (current) drug therapy: Secondary | ICD-10-CM | POA: Diagnosis not present

## 2017-05-31 DIAGNOSIS — I5022 Chronic systolic (congestive) heart failure: Secondary | ICD-10-CM

## 2017-05-31 DIAGNOSIS — Z951 Presence of aortocoronary bypass graft: Secondary | ICD-10-CM | POA: Insufficient documentation

## 2017-05-31 DIAGNOSIS — I2582 Chronic total occlusion of coronary artery: Secondary | ICD-10-CM | POA: Insufficient documentation

## 2017-05-31 DIAGNOSIS — I420 Dilated cardiomyopathy: Secondary | ICD-10-CM | POA: Diagnosis not present

## 2017-05-31 DIAGNOSIS — I25810 Atherosclerosis of coronary artery bypass graft(s) without angina pectoris: Secondary | ICD-10-CM | POA: Diagnosis not present

## 2017-05-31 DIAGNOSIS — I480 Paroxysmal atrial fibrillation: Secondary | ICD-10-CM

## 2017-05-31 DIAGNOSIS — Z8249 Family history of ischemic heart disease and other diseases of the circulatory system: Secondary | ICD-10-CM | POA: Insufficient documentation

## 2017-05-31 DIAGNOSIS — N183 Chronic kidney disease, stage 3 (moderate): Secondary | ICD-10-CM | POA: Insufficient documentation

## 2017-05-31 DIAGNOSIS — Z7902 Long term (current) use of antithrombotics/antiplatelets: Secondary | ICD-10-CM | POA: Insufficient documentation

## 2017-05-31 DIAGNOSIS — Z8546 Personal history of malignant neoplasm of prostate: Secondary | ICD-10-CM | POA: Insufficient documentation

## 2017-05-31 DIAGNOSIS — Z9889 Other specified postprocedural states: Secondary | ICD-10-CM | POA: Insufficient documentation

## 2017-05-31 LAB — COMPREHENSIVE METABOLIC PANEL
ALBUMIN: 3.7 g/dL (ref 3.5–5.0)
ALT: 13 U/L — ABNORMAL LOW (ref 17–63)
ANION GAP: 8 (ref 5–15)
AST: 20 U/L (ref 15–41)
Alkaline Phosphatase: 36 U/L — ABNORMAL LOW (ref 38–126)
BUN: 66 mg/dL — ABNORMAL HIGH (ref 6–20)
CHLORIDE: 103 mmol/L (ref 101–111)
CO2: 27 mmol/L (ref 22–32)
Calcium: 10.4 mg/dL — ABNORMAL HIGH (ref 8.9–10.3)
Creatinine, Ser: 2.74 mg/dL — ABNORMAL HIGH (ref 0.61–1.24)
GFR calc Af Amer: 23 mL/min — ABNORMAL LOW (ref >60)
GFR calc non Af Amer: 19 mL/min — ABNORMAL LOW (ref >60)
Glucose, Bld: 113 mg/dL — ABNORMAL HIGH (ref 65–99)
POTASSIUM: 4.7 mmol/L (ref 3.5–5.1)
SODIUM: 138 mmol/L (ref 135–145)
Total Bilirubin: 1.3 mg/dL — ABNORMAL HIGH (ref 0.3–1.2)
Total Protein: 7.3 g/dL (ref 6.5–8.1)

## 2017-05-31 LAB — CBC
HEMATOCRIT: 40.1 % (ref 39.0–52.0)
HEMOGLOBIN: 13 g/dL (ref 13.0–17.0)
MCH: 32.3 pg (ref 26.0–34.0)
MCHC: 32.4 g/dL (ref 30.0–36.0)
MCV: 99.8 fL (ref 78.0–100.0)
Platelets: 164 10*3/uL (ref 150–400)
RBC: 4.02 MIL/uL — ABNORMAL LOW (ref 4.22–5.81)
RDW: 13.8 % (ref 11.5–15.5)
WBC: 4.8 10*3/uL (ref 4.0–10.5)

## 2017-05-31 LAB — ECHOCARDIOGRAM COMPLETE: Weight: 2780 oz

## 2017-05-31 NOTE — Patient Instructions (Signed)
Routine lab work today. Will notify you of abnormal results  Follow up with Dr.McLean in 4 months **Please call our office at [phone] in April to schedule you May appointment**

## 2017-05-31 NOTE — Progress Notes (Signed)
  Echocardiogram 2D Echocardiogram has been performed.  Marcus [name] 05/31/2017, 4:02 PM

## 2017-06-02 NOTE — Progress Notes (Signed)
Advanced Heart Failure Clinic Note   Patient ID: Marcus [name], male   DOB: Dec 02, 1928, 84 y.o.   MRN: [id number] EP: Dr Lovena Le  Cardiology: Aundra Dubin  84 y.o. with history of pacemaker for bradycardia, paroxysmal atrial fibrillation, CAD s/p CABG and PCIs, and ischemic cardiomyopathy EF 20-25%. Underwent CRT-D upgrade 10/08/13 due to 85% RV pacing.     Admitted 5/19-5/26/15 with volume overload. He was diuresed with milrinone and IV lasix and discharge weight was 153 lbs. He underwent upgrade of his ICD to CRT-D as well.  He was able to wean off milrinone and did much better after CRT upgrade.  He was admitted in 3/16 with pacemaker pocket infection.  CRT-D device was explanted and temporary-permanent pacemaker with placed.  He later had a St Jude CRT-P system re-implanted.  He was noted to be back in atrial fibrillation and had TEE-guided DCCV to NSR.  TEE showed EF 25-30%.    RHC in 10/17 showed near-normal filling pressures and low but not markedly low cardiac output.  Coreg was cut back to 6.25 mg bid.  Echo in 10/17 showed improvement in LV function, EF 35-40%.   He returns today for followup of CHF.  Weight is down 3 lbs.  He is taking torsemide 20 mg every other day now and is feeling good.  No peripheral edema.  No dyspnea walking on flat ground.  No chest pain, no orthopnea/PND.   Echo today was stable with EF 35-40%, inferior/inferolateral akinesis.   Labs (3/16): K 4.1 => 4.6, creatinine 1.87 => 1.97, HCT 34.4, TSH normal, LFTs normal.  Labs (6/16): K 4.2, creatinine 2.34 Labs (10/16): K 4.8, creatinine 2.29 Labs (6/17): K 5.1, creatinine 1.89, AST 30, ALT 25, TSH normal, LDL 159, HCT 37 Labs (10/17): K 5, creatinine 2.45, TSH normal, hgb 12.7, BNP 453 Labs (11/17): LFTs normal Labs (12/17): hgb 11.7, creatinine 2.16 Labs (1/18): K 4.9, creatinine 2.54 Labs (7/18): K 4.3, creatinine 2.87, hgb 12.5 Labs (8/18): K 4.1, creatinine 2.31, LFTs normal, BNP 816, TSH normal Labs  (9/18): K 4.4, creatinine 2.41, BNP 761, hgb 12.8 Labs (11/18): K 4.6, creatinine 2.56  PMH:  1. Symptomatic bradycardia: [employer] CRT-D.  He developed pocket infection in 3/16 and had device extracted.  He had St Jude CRT-P system replaced.  2. HTN 3. Hyperlipidemia: Has refused statins.  4. Atrial fibrillation: History of prior DCCV.  Paroxysmal.  On Eliquis.  Has history of cardioembolism to right leg with right popliteal and tibial embolectomy (had been off Eliquis). Has had nausea with amiodarone in the past but now tolerating.  Recurrence in 3/16, had TEE-guided DCCV.  5. Prostate cancer 6. CAD: CABG remotely with SVG-OM and LIMA-LAD.  Late 1990s had PCI to RCA.  In 12/09 had LHC with total occlusion of SVG-OM, patent LIMA-LAD, DES to LCx and DES to RCA.   7. Ischemic cardiomyopathy: Echo (3/15) with EF 20-25%, wall motion abnormalities noted, mild to moderately decreased RV systolic function. ICD upgraded to [place] CRT-D 10/08/13.  TEE (3/16) with EF 25-30%, mildly dilated RV with mildly decreased systolic function, mild to moderate MR.  - RHC (10/17): mean RA 4, PA 44/21 mean 31, unable to wedge, CI 2.0. - Echo (10/17): EF 35-40%, moderate dilation, inferior/inferolateral AK, moderate MR, RV mildly dilated with mildly decreased systolic function.  8. CKD  SH: Married, nonsmoker, lives in [place]  [place]: CAD  ROS: All systems reviewed and negative except as per HPI.   Current Outpatient Medications  Medication Sig Dispense Refill  . amiodarone (PACERONE) 200 MG tablet Take 0.5 tablets (100 mg total) by mouth daily. 45 tablet 3  . apixaban (ELIQUIS) 2.5 MG TABS tablet Take 1 tablet (2.5 mg total) by mouth 2 (two) times daily. 180 tablet 3  . carvedilol (COREG) 6.25 MG tablet Take 1 tablet (6.25 mg total) by mouth 2 (two) times daily with a meal. 180 tablet 2  . Coenzyme Q10 (CO Q 10) 100 MG CAPS Take 1 capsule by mouth 2 (two) times daily.    Marland Kitchen FLUZONE HIGH-DOSE 0.5 ML injection       . GLUTATHIONE PO Take by mouth daily. Take as directed    . isosorbide mononitrate (IMDUR) 30 MG 24 hr tablet Take 0.5 tablets (15 mg total) by mouth daily. 45 tablet 3  . Multiple Vitamins-Minerals (MULTIVITAMINS/MINERALS ADULT PO) Take by mouth daily. MacuGuard    . Multiple Vitamins-Minerals (PRESERVISION AREDS 2 PO) Take 1 capsule by mouth 2 (two) times daily.    Marland Kitchen PREVNAR 13 SUSP injection     . spironolactone (ALDACTONE) 25 MG tablet TAKE ONE-HALF TABLET BY MOUTH ONCE DAILY 45 tablet 3  . torsemide (DEMADEX) 20 MG tablet Take 20 mg by mouth every other day.     No current facility-administered medications for this encounter.     Vitals:   05/31/17 1502  BP: 117/76  Pulse: 75  SpO2: 100%  Weight: 173 lb 12 oz (78.8 kg)   Wt Readings from Last 3 Encounters:  05/31/17 173 lb 12 oz (78.8 kg)  04/25/17 174 lb 2.6 oz (79 kg)  04/23/17 172 lb 13.5 oz (78.4 kg)    General: NAD Neck: No JVD, no thyromegaly or thyroid nodule.  Lungs: Clear to auscultation bilaterally with normal respiratory effort. CV: Nondisplaced PMI.  Heart regular S1/S2, no S3/S4, no murmur.  No peripheral edema.  No carotid bruit.  Normal pedal pulses.  Abdomen: Soft, nontender, no hepatosplenomegaly, no distention.  Skin: Intact without lesions or rashes.  Neurologic: Alert and oriented x 3.  Psych: Normal affect. Extremities: No clubbing or cyanosis.  HEENT: Normal.   Assessment/Plan:  1. Chronic systolic CHF: Ischemic cardiomyopathy.  I reviewed today's echo, EF is 35-40% (stable from most recent prior).  Has St Jude CRT-P device.  NYHA class II symptoms, stable. He does not look volume overloaded on exam.  - Continue torsemide 20 every other day.  If he gains weight/develops edema, can increase to 20 mg daily for a few days.  BMET today.  - Continue current spironolactone 12.5 daily and Coreg 6.25 mg bid.   - Not on ACEI/ARB with CKD. 2. Atrial fibrillation: Paroxysmal. He is in NSR on amiodarone 100 mg  daily. - LFTs today, TSH was normal in 11/18.  Needs regular eye exam. - He is on Eliquis 2.5 mg bid (age, elevated creatinine). CBC today.  3. CAD: Stable. No chest pain. Continue medical management.  - He is not currently on a statin (he has refused to take a statin).   - He is not on ASA given use of Eliquis and stable CAD.  4. Sick sinus syndrome: s/p CRT-P. 5. CKD, stage III: BMET today.     Followup in 4 months.   [name] 06/02/2017

## 2017-06-04 ENCOUNTER — Encounter (HOSPITAL_COMMUNITY): Payer: Medicare Other

## 2017-06-04 NOTE — Progress Notes (Signed)
No ICM remote transmission received for 05/23/2017 and next ICM transmission scheduled for 06/13/2017.    

## 2017-06-05 ENCOUNTER — Other Ambulatory Visit (HOSPITAL_COMMUNITY): Payer: Self-pay | Admitting: Cardiology

## 2017-06-07 DIAGNOSIS — H9222 Otorrhagia, left ear: Secondary | ICD-10-CM | POA: Diagnosis not present

## 2017-06-07 DIAGNOSIS — H93293 Other abnormal auditory perceptions, bilateral: Secondary | ICD-10-CM | POA: Diagnosis not present

## 2017-06-07 DIAGNOSIS — H9193 Unspecified hearing loss, bilateral: Secondary | ICD-10-CM | POA: Diagnosis not present

## 2017-06-13 ENCOUNTER — Telehealth: Payer: Self-pay

## 2017-06-13 ENCOUNTER — Ambulatory Visit (INDEPENDENT_AMBULATORY_CARE_PROVIDER_SITE_OTHER): Payer: Medicare Other

## 2017-06-13 DIAGNOSIS — Z950 Presence of cardiac pacemaker: Secondary | ICD-10-CM

## 2017-06-13 DIAGNOSIS — I5022 Chronic systolic (congestive) heart failure: Secondary | ICD-10-CM

## 2017-06-13 NOTE — Progress Notes (Signed)
EPIC Encounter for ICM Monitoring  Patient Name: Marcus [name] is a 84 y.o. male Date: 06/13/2017 Primary Care Physican: Patient, No Pcp Per Primary [place] Electrophysiologist: Lovena Le Dry Weight: Previous weight 167lb  Bi-V Pacing: >99%                                                Attempted call to wife.  Transmission reviewed.    Thoracic impedance normal.  Prescribed dosage: Torsemide20 mg1 tablet every other day.  If he gains weight/develops edema, can increase to 20 mg daily for a few days.  Labs: 05/31/2017 Creatinine 2.74, BUN 66, Potassium 4.7, Sodium 138, EGFR 19-23 04/17/2017 Creatinine 2.56, BUN 60, Potassium 4.6, Sodium 136, EGFR 21-25 04/05/2017 Creatinine 2.83, BUN 66, Potassium4.1,Sodium 138, EGFR 19-22 02/08/2017 Creatinine 2.41, BUN 57, Potassium 4.4, Sodium 140, EGFR 23-27 01/09/2017 Creatinine 2.31, BUN 49, Potassium 4.1, Sodium 141, EGFR 24-28 12/05/2016 Creatinine 2.87, BUN 71, Potassium 4.3, Sodium 139, EGFR 19-22 11/22/2016 Creatinine 2.57, BUN 61, Potassium 4.3, Sodium 139, EGFR 21-25  06/14/2016 Creatinine 2.54. BUN 59, Potassium 4.9, Sodium 137, EGFR 22-25 06/01/2016 Creatinine 2.78, BUN 78, Potassium 4.5, Sodium 136, EGFR 19-22  05/22/2016 Creatinine 2.38, BUN 60, Potassium 4.7, Sodium 136, EGFR 23-27 05/08/2016 Creatinine 2.16, BUN 40, Potassium 4.5, Sodium 143, EGFR 26-30 03/26/2016 Creatinine 1.77, BUN 26, Potassium 4.3, Sodium 140, EGFR 33-39 02/21/2016 Creatinine 2.45, BUN 75, Potassium 5.0, Sodium 138, EGFR 23-26  10/05/2016Creatinine 2.29, BUN 49, Potassium 4.8, Sodium 137, EGFR 25-29  Recommendations: NONE - Unable to reach.  Follow-up plan: ICM clinic phone appointment on 07/15/2017.    Copy of ICM check sent to Dr. Lovena Le.   3 month ICM trend: 06/13/2017    1 Year ICM trend:       Rosalene Billings, RN 06/13/2017 1:10 PM

## 2017-06-13 NOTE — Telephone Encounter (Signed)
Remote ICM transmission received.  Attempted call to wife patient and no answer or answering machine

## 2017-06-14 ENCOUNTER — Encounter (HOSPITAL_COMMUNITY): Payer: Self-pay

## 2017-06-14 DIAGNOSIS — H9222 Otorrhagia, left ear: Secondary | ICD-10-CM | POA: Diagnosis not present

## 2017-06-14 DIAGNOSIS — H903 Sensorineural hearing loss, bilateral: Secondary | ICD-10-CM | POA: Diagnosis not present

## 2017-06-14 DIAGNOSIS — I5022 Chronic systolic (congestive) heart failure: Secondary | ICD-10-CM

## 2017-06-14 NOTE — Addendum Note (Signed)
Encounter addended by: Jewel Baize, RD on: 06/14/2017 11:30 AM  Actions taken: Flowsheet data copied forward, Visit Navigator Flowsheet section accepted

## 2017-06-14 NOTE — Progress Notes (Signed)
Discharge Progress Report  Patient Details  Name: Marcus [name] MRN: [id number] Date of Birth: 05/17/1929 Referring Provider:     Pulmonary Rehab Walk Test from 02/07/2017 in [place]  Referring Provider  Aundra Dubin       Number of Visits: 21  Reason for Discharge:  Patient reached a stable level of exercise. Patient independent in their exercise. Patient has met program and personal goals.  Smoking History:  Social History   Tobacco Use  Smoking Status Former Smoker  . Last attempt to quit: 02/10/1971  . Years since quitting: 46.3  Smokeless Tobacco Never Used  Tobacco Comment   quit smoking 68yr ago    Diagnosis:  Heart failure, chronic systolic (HCC)  ADL UCSD: Pulmonary Assessment Scores    Row Name 05/07/17 1610 05/23/17 1127       ADL UCSD   ADL Phase  Exit  Exit    SOB Score total  -  6      CAT Score   CAT Score  -  5 Exit      mMRC Score   mMRC Score  0  -       Initial Exercise Prescription:   Discharge Exercise Prescription (Final Exercise Prescription Changes): Exercise Prescription Changes - 04/25/17 1200      Response to Exercise   Blood Pressure (Admit)  126/82    Blood Pressure (Exercise)  110/60    Blood Pressure (Exit)  106/56    Heart Rate (Admit)  91 bpm    Heart Rate (Exercise)  101 bpm    Heart Rate (Exit)  73 bpm    Oxygen Saturation (Admit)  100 %    Oxygen Saturation (Exercise)  95 %    Oxygen Saturation (Exit)  100 %    Rating of Perceived Exertion (Exercise)  13    Perceived Dyspnea (Exercise)  1    Duration  Continue with 45 min of aerobic exercise without signs/symptoms of physical distress.    Intensity  THRR unchanged      Progression   Progression  Continue to progress workloads to maintain intensity without signs/symptoms of physical distress.      Resistance Training   Training Prescription  Yes    Weight  blue bands    Reps  10-15    Time  10 Minutes      Bike   Level   5    Minutes  17      Track   Laps  14    Minutes  17       Functional Capacity: 6 Minute Walk    Row Name 05/07/17 1608         6 Minute Walk   Phase  Discharge     Distance  1450 feet     Distance Feet Change  230 ft     Walk Time  6 minutes     # of Rest Breaks  0     MPH  2.74     METS  3.07     RPE  9     Perceived Dyspnea   1     Symptoms  Yes (comment)     Comments  used rolator     Resting HR  74 bpm     Resting BP  96/60     Resting Oxygen Saturation   95 %     Exercise Oxygen Saturation  during 6 min walk  - pulse  ox could not read     Max Ex. BP  120/64       Interval Oxygen   6 Minute Liters of Oxygen  99 L     2 Minute Post Oxygen Saturation %  100 %        Psychological, QOL, Others - Outcomes: PHQ 2/9: Depression screen [facility] 2/9 05/07/2017 02/01/2017  Decreased Interest 0 0  Down, Depressed, Hopeless 0 0  PHQ - 2 Score 0 0  Some recent data might be hidden    Quality of Life:   Personal Goals: Goals established at orientation with interventions provided to work toward goal.    Personal Goals Discharge: Goals and Risk Factor Review    Row Name 04/23/17 1257 06/14/17 1502           Core Components/Risk Factors/Patient Goals Review   Personal Goals Review  Improve shortness of breath with ADL's;Develop more efficient breathing techniques such as purse lipped breathing and diaphragmatic breathing and practicing self-pacing with activity.;Heart Failure  Improve shortness of breath with ADL's;Develop more efficient breathing techniques such as purse lipped breathing and diaphragmatic breathing and practicing self-pacing with activity.;Heart Failure      Review  patient continues to tolerate workloads increases at each exercise session. He works really hard to keep his RPE between 37 and 13. His weight remains stable and he states he definitely sees an improvement in his shortness of breath with ADLs. He is scheduled to graduate over the next 30  days.  program goals met at discharge      Expected Outcomes  see admission expected outcomes  -         Exercise Goals and Review:   Nutrition & Weight - Outcomes:  Post Biometrics - 05/07/17 1611       Post  Biometrics   Grip Strength  38 kg       Nutrition:   Nutrition Discharge: Nutrition Assessments - 05/30/17 1402      Rate Your Plate Scores   Pre Score  49    Post Score  60       Education Questionnaire Score: Knowledge Questionnaire Score - 05/23/17 1127      Knowledge Questionnaire Score   Post Score  12/13

## 2017-07-15 ENCOUNTER — Telehealth: Payer: Self-pay

## 2017-07-15 ENCOUNTER — Ambulatory Visit (INDEPENDENT_AMBULATORY_CARE_PROVIDER_SITE_OTHER): Payer: Medicare Other | Admitting: *Deleted

## 2017-07-15 DIAGNOSIS — I5022 Chronic systolic (congestive) heart failure: Secondary | ICD-10-CM | POA: Diagnosis not present

## 2017-07-15 DIAGNOSIS — Z950 Presence of cardiac pacemaker: Secondary | ICD-10-CM

## 2017-07-15 DIAGNOSIS — I442 Atrioventricular block, complete: Secondary | ICD-10-CM | POA: Diagnosis not present

## 2017-07-15 NOTE — Progress Notes (Signed)
Remote pacemaker transmission.   

## 2017-07-15 NOTE — Progress Notes (Signed)
EPIC Encounter for ICM Monitoring  Patient Name: Marcus [name] is a 84 y.o. male Date: 07/15/2017 Primary Care Physican: Patient, No Pcp Per Primary Cardiologist:[place] Electrophysiologist: Lovena Le Dry Weight: Previous weight167lb  Bi-V Pacing: >99%      Attempted call to patient and unable to reach.  Left message to return call.  Transmission reviewed.    Thoracic impedance appears to be close to baseline.  Prescribed dosage: Torsemide20 mg1 tablet every other day.  If he gains weight/develops edema, can increase to 20 mg daily for a few days per Dr Claris Gladden office note 05/31/2017.  Labs: 05/31/2017 Creatinine 2.74, BUN 66, Potassium 4.7, Sodium 138, EGFR 19-23 04/17/2017 Creatinine 2.56, BUN 60, Potassium 4.6, Sodium 136, EGFR 21-25 04/05/2017 Creatinine 2.83, BUN 66, Potassium4.1,Sodium 138, EGFR 19-22 02/08/2017 Creatinine 2.41, BUN 57, Potassium 4.4, Sodium 140, EGFR 23-27 01/09/2017 Creatinine 2.31, BUN 49, Potassium 4.1, Sodium 141, EGFR 24-28 12/05/2016 Creatinine 2.87, BUN 71, Potassium 4.3, Sodium 139, EGFR 19-22 11/22/2016 Creatinine 2.57, BUN 61, Potassium 4.3, Sodium 139, EGFR 21-25  06/14/2016 Creatinine 2.54. BUN 59, Potassium 4.9, Sodium 137, EGFR 22-25 06/01/2016 Creatinine 2.78, BUN 78, Potassium 4.5, Sodium 136, EGFR 19-22  05/22/2016 Creatinine 2.38, BUN 60, Potassium 4.7, Sodium 136, EGFR 23-27 05/08/2016 Creatinine 2.16, BUN 40, Potassium 4.5, Sodium 143, EGFR 26-30 03/26/2016 Creatinine 1.77, BUN 26, Potassium 4.3, Sodium 140, EGFR 33-39 02/21/2016 Creatinine 2.45, BUN 75, Potassium 5.0, Sodium 138, EGFR 23-26  10/05/2016Creatinine 2.29, BUN 49, Potassium 4.8, Sodium 137, EGFR 25-29  Recommendations: NONE - Unable to reach.  Follow-up plan: ICM clinic phone appointment on 07/23/2017.    Copy of ICM check sent to Dr. Lovena Le and Dr. Aundra Dubin.   3 month ICM trend: 07/15/2017     Direct Trend Viewer    1 Year ICM trend:        Rosalene Billings, RN 07/15/2017 2:52 PM

## 2017-07-15 NOTE — Telephone Encounter (Signed)
Remote ICM transmission received.  Attempted call to wife and left message to return call.

## 2017-07-18 ENCOUNTER — Encounter: Payer: Self-pay | Admitting: Cardiology

## 2017-07-19 NOTE — Progress Notes (Signed)
Attempted call to wife and left message to return call.

## 2017-07-19 NOTE — Progress Notes (Signed)
With recent trend, would increase torsemide to 20 mg daily for 5 days then back to every other day.  Cut back on sodium intake.

## 2017-07-22 NOTE — Progress Notes (Addendum)
Call to wife.  Advised Dr Aundra Dubin instructed patient to take Torsemide 20 mg 1 tablet daily x 5 days and then return to every other day.  She stated she will have him start today. She discussed with patient over the weekend about limiting salt in diet.   Rescheduled ICM remote transmission for 07/26/2017.

## 2017-07-26 ENCOUNTER — Ambulatory Visit (INDEPENDENT_AMBULATORY_CARE_PROVIDER_SITE_OTHER): Payer: Self-pay

## 2017-07-26 DIAGNOSIS — I5022 Chronic systolic (congestive) heart failure: Secondary | ICD-10-CM

## 2017-07-26 DIAGNOSIS — Z950 Presence of cardiac pacemaker: Secondary | ICD-10-CM

## 2017-07-26 NOTE — Progress Notes (Signed)
EPIC Encounter for ICM Monitoring  Patient Name: Marcus [name] is a 84 y.o. male Date: 07/26/2017 Primary Care Physican: Patient, No Pcp Per Primary Cardiologist:[place] Electrophysiologist: Lovena Le Dry Weight: 166lb  Bi-V Pacing: >99%  AT/AF Burden: <1%      Spoke with wife.  Heart Failure questions reviewed, pt feeling really tired today but thinks the fluid has resolved.   He is back to taking Torsemide every other day as prescribed.    Thoracic impedance returned to normal after taking Torsemide 20 mg 1 tablet daily x 5 days.     Prescribed dosage: Torsemide20 mg1 tablet every other day.If he gains weight/develops edema, can increase to 20 mg daily for a few days per Dr Claris Gladden office note 05/31/2017.  Labs: 05/31/2017 Creatinine 2.74, BUN 66, Potassium 4.7, Sodium 138, EGFR 19-23 04/17/2017 Creatinine 2.56, BUN 60, Potassium 4.6, Sodium 136, EGFR 21-25 04/05/2017 Creatinine 2.83, BUN 66, Potassium4.1,Sodium 138, EGFR 19-22 02/08/2017 Creatinine 2.41, BUN 57, Potassium 4.4, Sodium 140, EGFR 23-27 01/09/2017 Creatinine 2.31, BUN 49, Potassium 4.1, Sodium 141, EGFR 24-28 12/05/2016 Creatinine 2.87, BUN 71, Potassium 4.3, Sodium 139, EGFR 19-22 11/22/2016 Creatinine 2.57, BUN 61, Potassium 4.3, Sodium 139, EGFR 21-25  06/14/2016 Creatinine 2.54. BUN 59, Potassium 4.9, Sodium 137, EGFR 22-25 06/01/2016 Creatinine 2.78, BUN 78, Potassium 4.5, Sodium 136, EGFR 19-22  05/22/2016 Creatinine 2.38, BUN 60, Potassium 4.7, Sodium 136, EGFR 23-27 05/08/2016 Creatinine 2.16, BUN 40, Potassium 4.5, Sodium 143, EGFR 26-30 03/26/2016 Creatinine 1.77, BUN 26, Potassium 4.3, Sodium 140, EGFR 33-39 02/21/2016 Creatinine 2.45, BUN 75, Potassium 5.0, Sodium 138, EGFR 23-26  10/05/2016Creatinine 2.29, BUN 49, Potassium 4.8, Sodium 137, EGFR 25-29  Recommendations: No changes.  Encouraged to call for fluid symptoms.  Follow-up plan: ICM clinic phone appointment on 08/15/2017.  Recall  appointment for Dr Aundra Dubin in April.   Copy of ICM check sent to Dr. Lovena Le.   3 month ICM trend: 07/26/2017    AT/AF   1 Year ICM trend:       Rosalene Billings, RN 07/26/2017 11:21 AM

## 2017-08-01 LAB — CUP PACEART REMOTE DEVICE CHECK
Battery Remaining Percentage: 95.5 %
Brady Statistic AP VP Percent: 93 %
Brady Statistic AP VS Percent: 1 %
Brady Statistic AS VP Percent: 6.8 %
Brady Statistic AS VS Percent: 1 %
Brady Statistic RA Percent Paced: 93 %
Date Time Interrogation Session: 20190225073036
Implantable Lead Implant Date: 20160304
Implantable Lead Implant Date: 20160304
Implantable Lead Location: 753858
Implantable Lead Location: 753859
Implantable Lead Location: 753860
Lead Channel Impedance Value: 450 Ohm
Lead Channel Impedance Value: 480 Ohm
Lead Channel Impedance Value: 800 Ohm
Lead Channel Pacing Threshold Amplitude: 1 V
Lead Channel Pacing Threshold Amplitude: 1 V
Lead Channel Pacing Threshold Pulse Width: 0.5 ms
Lead Channel Pacing Threshold Pulse Width: 0.6 ms
Lead Channel Pacing Threshold Pulse Width: 1 ms
Lead Channel Sensing Intrinsic Amplitude: 12 mV
Lead Channel Setting Pacing Amplitude: 2 V
Lead Channel Setting Pacing Pulse Width: 0.5 ms
Lead Channel Setting Sensing Sensitivity: 5 mV
MDC IDC LEAD IMPLANT DT: 20160304
MDC IDC MSMT BATTERY REMAINING LONGEVITY: 67 mo
MDC IDC MSMT BATTERY VOLTAGE: 2.96 V
MDC IDC MSMT LEADCHNL RA PACING THRESHOLD AMPLITUDE: 1.25 V
MDC IDC MSMT LEADCHNL RA SENSING INTR AMPL: 0.6 mV
MDC IDC PG IMPLANT DT: 20160304
MDC IDC SET LEADCHNL LV PACING AMPLITUDE: 2 V
MDC IDC SET LEADCHNL LV PACING PULSEWIDTH: 0.6 ms
MDC IDC SET LEADCHNL RA PACING AMPLITUDE: 2.5 V
Pulse Gen Model: 3242
Pulse Gen Serial Number: 7725037

## 2017-08-15 ENCOUNTER — Ambulatory Visit (INDEPENDENT_AMBULATORY_CARE_PROVIDER_SITE_OTHER): Payer: Medicare Other

## 2017-08-15 DIAGNOSIS — Z950 Presence of cardiac pacemaker: Secondary | ICD-10-CM

## 2017-08-15 DIAGNOSIS — I5022 Chronic systolic (congestive) heart failure: Secondary | ICD-10-CM | POA: Diagnosis not present

## 2017-08-15 NOTE — Progress Notes (Signed)
EPIC Encounter for ICM Monitoring  Patient Name: Marcus [name] is a 84 y.o. male Date: 08/15/2017 Primary Care Physican: Patient, No Pcp Per Primary Cardiologist:[place] Electrophysiologist: Lovena Le Dry Weight: Previous 166lb  Bi-V Pacing: >99%  AT/AF Burden: <1%      Attempted call to wife and unable to reach.  Left detailed message regarding transmission.  Transmission reviewed.    Thoracic impedance normal but was abnormal suggesting fluid accumulation from 07/30/2017 to 08/07/2017.  Prescribed dosage: Torsemide20 mg1 tablet every other day.If he gains weight/develops edema, can increase to 20 mg daily for a few daysper Dr Claris Gladden office note 05/31/2017.  Labs: 05/31/2017 Creatinine 2.74, BUN 66, Potassium 4.7, Sodium 138, EGFR 19-23 04/17/2017 Creatinine 2.56, BUN 60, Potassium 4.6, Sodium 136, EGFR 21-25 04/05/2017 Creatinine 2.83, BUN 66, Potassium4.1,Sodium 138, EGFR 19-22 02/08/2017 Creatinine 2.41, BUN 57, Potassium 4.4, Sodium 140, EGFR 23-27 01/09/2017 Creatinine 2.31, BUN 49, Potassium 4.1, Sodium 141, EGFR 24-28 12/05/2016 Creatinine 2.87, BUN 71, Potassium 4.3, Sodium 139, EGFR 19-22 11/22/2016 Creatinine 2.57, BUN 61, Potassium 4.3, Sodium 139, EGFR 21-25  06/14/2016 Creatinine 2.54. BUN 59, Potassium 4.9, Sodium 137, EGFR 22-25 06/01/2016 Creatinine 2.78, BUN 78, Potassium 4.5, Sodium 136, EGFR 19-22  05/22/2016 Creatinine 2.38, BUN 60, Potassium 4.7, Sodium 136, EGFR 23-27 05/08/2016 Creatinine 2.16, BUN 40, Potassium 4.5, Sodium 143, EGFR 26-30 03/26/2016 Creatinine 1.77, BUN 26, Potassium 4.3, Sodium 140, EGFR 33-39 02/21/2016 Creatinine 2.45, BUN 75, Potassium 5.0, Sodium 138, EGFR 23-26  10/05/2016Creatinine 2.29, BUN 49, Potassium 4.8, Sodium 137, EGFR 25-29  Recommendations: Left voice mail with ICM number and encouraged to call if experiencing any fluid symptoms.  Follow-up plan: ICM clinic phone appointment on 09/16/2017.   Copy of ICM  check sent to Dr. Lovena Le.   3 month ICM trend: 08/15/2017    AT/AF     1 Year ICM trend:       Rosalene Billings, RN 08/15/2017 8:09 AM

## 2017-08-16 ENCOUNTER — Telehealth: Payer: Self-pay

## 2017-08-16 NOTE — Telephone Encounter (Signed)
Remote ICM transmission received.  Attempted call to wife and left detailed message per DPR regarding transmission and next ICM scheduled for 09/16/2017.  Advised to return call for any fluid symptoms or questions.

## 2017-09-12 ENCOUNTER — Other Ambulatory Visit (HOSPITAL_COMMUNITY): Payer: Self-pay | Admitting: Internal Medicine

## 2017-09-12 DIAGNOSIS — I480 Paroxysmal atrial fibrillation: Secondary | ICD-10-CM

## 2017-09-12 DIAGNOSIS — I5023 Acute on chronic systolic (congestive) heart failure: Secondary | ICD-10-CM

## 2017-09-16 ENCOUNTER — Telehealth: Payer: Self-pay

## 2017-09-16 NOTE — Telephone Encounter (Signed)
Spoke with wife and reminded of remote transmission that is due today. Pt verbalized understanding.

## 2017-09-20 NOTE — Progress Notes (Signed)
No ICM remote transmission received for 09/16/2017 and next ICM transmission scheduled for 10/15/2017.

## 2017-10-10 ENCOUNTER — Encounter: Payer: Self-pay | Admitting: Internal Medicine

## 2017-10-10 ENCOUNTER — Ambulatory Visit (INDEPENDENT_AMBULATORY_CARE_PROVIDER_SITE_OTHER): Payer: Medicare Other | Admitting: Internal Medicine

## 2017-10-10 VITALS — BP 100/62 | HR 70 | Ht 69.0 in | Wt 176.4 lb

## 2017-10-10 DIAGNOSIS — I442 Atrioventricular block, complete: Secondary | ICD-10-CM | POA: Diagnosis not present

## 2017-10-10 DIAGNOSIS — Z950 Presence of cardiac pacemaker: Secondary | ICD-10-CM

## 2017-10-10 DIAGNOSIS — I5022 Chronic systolic (congestive) heart failure: Secondary | ICD-10-CM

## 2017-10-10 NOTE — Progress Notes (Addendum)
HPI [name] returns today for followup. He is a pleasant 84 yo man with CHB, and chronic systolic heart failure who underwent BiV PM insertion years ago and then developed erosion of his pocket and underwent extraction approx. 12 months ago followed by re-insertion of a biv pm on the contralateral side. He has done well in the interim with no infectious symptoms. He denies chest pain or sob. He remains active working outside. He exercises regularly. He had no specific complaints. He admits to some dietary indiscretion.  No Known Allergies   Current Outpatient Medications  Medication Sig Dispense Refill  . amiodarone (PACERONE) 200 MG tablet TAKE 1/2 TABLET (100MG      TOTAL) DAILY 45 tablet 3  . apixaban (ELIQUIS) 2.5 MG TABS tablet Take 1 tablet (2.5 mg total) by mouth 2 (two) times daily. 180 tablet 3  . carvedilol (COREG) 6.25 MG tablet TAKE 1 TABLET BY MOUTH 2 TIMES A DAY WITH MEALS 180 tablet 1  . Coenzyme Q10 (CO Q 10) 100 MG CAPS Take 1 capsule by mouth 2 (two) times daily.    . isosorbide mononitrate (IMDUR) 30 MG 24 hr tablet Take 0.5 tablets (15 mg total) by mouth daily. 45 tablet 3  . Multiple Vitamins-Minerals (MULTIVITAMINS/MINERALS ADULT PO) Take by mouth daily. MacuGuard    . Multiple Vitamins-Minerals (PRESERVISION AREDS 2 PO) Take 1 capsule by mouth 2 (two) times daily.    Marland Kitchen spironolactone (ALDACTONE) 25 MG tablet TAKE ONE-HALF TABLET BY MOUTH ONCE DAILY 45 tablet 3  . torsemide (DEMADEX) 20 MG tablet Take 20 mg by mouth every other day.     No current facility-administered medications for this visit.      Past Medical History:  Diagnosis Date  . Atrial flutter ([place])    a. Dx 07/2014, s/p TEE/DCCV.  . Cataracts, bilateral   . Chronic systolic CHF (congestive heart failure) ([place])    a. s/p CRT-D 09/2013. b. Device explant with temp perm then subsequent CRT-P 07/2014.  Marland Kitchen CKD (chronic kidney disease), stage III ([place])   . Coronary artery disease    a. s/p remote  CABG x 2(VG->OM, LIMA->LAD;  b. Late 90's s/p PCI of RCA;  c. 12/09 Cath/PCI: LM 60m, 70-80d (3.0x14mm Xience DES), LAD100p, LCX 80-90p (2.25x74mmTaxus Atom DES), RCA 100d (2.5x59mm Xience DES), VG->OM 100, LIMA->LAD nl, EF 30-35%.  Marland Kitchen DVT (deep venous thrombosis) ([place]) 2017   Right knee  . Hard of hearing   . History of colon polyps   . History of gastric ulcer   . History of gout   . History of shingles   . Hyperlipidemia   . Hypertension   . Ischemic cardiomyopathy    a. EF 35-40% by echo 05/2012, b. EF 20-25%, akinesis and scarring of inferolateral, inferior and inferoseptal myocardium, mild AI, mod MR, LA mod dilated, RV mildly dialted and sys fx mildly reduced, RA mildly dilated (09/2013)  c. EF 35-40% by 2D in 07/2014, 25-30% by TEE.  . Macular degeneration, dry   . Pacemaker infection ([place])   . PAF (paroxysmal atrial fibrillation) ([place])   . Peripheral vascular disease ([place])   . Presence of permanent cardiac pacemaker 07/23/2014   [facility] [id number], Serial [id number]  . Prostate cancer ([place])   . Symptomatic bradycardia    a. 08/2003 s/p MDT Enpulse [id number] Dual chamber PPM ser # [id number] H. b. Device explant due to infection/temp perm insertion/PPM 07/2014.    ROS:   All systems reviewed  and negative except as noted in the HPI.   Past Surgical History:  Procedure Laterality Date  . ANGIOPLASTY     stent placement  . APPENDECTOMY    . BI-VENTRICULAR PACEMAKER INSERTION [name] 07/23/2014   Procedure: BI-VENTRICULAR PACEMAKER INSERTION (CRT-P);  Surgeon: Deboraha Sprang, MD;  Location: [facility] CATH LAB;  Service: Cardiovascular;  Laterality: [name];  . BI-VENTRICULAR PACEMAKER UPGRADE [name] 10/08/2013   Procedure: BI-VENTRICULAR PACEMAKER UPGRADE;  Surgeon: [name], MD;  Location[facility] CATH LAB;  Service: Cardiovascular;  Laterality: [name];  . CARDIAC CATHETERIZATION  most recent in 2009   several  . CARDIAC CATHETERIZATION Right 07/19/2014   Procedure: TEMPORARY PACEMAKER;  Surgeon:  [name], MD;  Location: [place];  Service: Cardiovascular;  Laterality: Right;  . CARDIAC CATHETERIZATION [name] 03/01/2016   Procedure: Right Heart Cath;  Surgeon: [name], MD;  Location: [place] CV LAB;  Service: Cardiovascular;  Laterality: [name];  . CARDIOVERSION  06/26/2012   Procedure: CARDIOVERSION;  Surgeon: Lelon Perla, MD;  Location: [facility] ENDOSCOPY;  Service: Cardiovascular;  Laterality: [name];  . CARDIOVERSION [name] 07/27/2014   Procedure: CARDIOVERSION;  Surgeon: [name], MD;  Location: [place];  Service: Cardiovascular;  Laterality: [name];  . CATARACT EXTRACTION W/PHACO Left 09/24/2016   Procedure: CATARACT EXTRACTION PHACO AND INTRAOCULAR LENS PLACEMENT ([place])  Left;  Surgeon: Leandrew Koyanagi, MD;  Location: [place];  Service: Ophthalmology;  Laterality: Left;  . CATARACT EXTRACTION W/PHACO Right 10/17/2016   Procedure: CATARACT EXTRACTION PHACO AND INTRAOCULAR LENS PLACEMENT ([place])  Right;  Surgeon: Leandrew Koyanagi, MD;  Location: [place];  Service: Ophthalmology;  Laterality: Right;  prefers mid to late morning arrival  . COLONOSCOPY    . CORONARY ANGIOPLASTY     couple   . CORONARY ARTERY BYPASS GRAFT  1990   left main  . DOPPLER ECHOCARDIOGRAPHY  06/24/2002   EF 60-65%  . EMBOLECTOMY Right 07/28/2013   Procedure: Right Popliteal and Tibial Embolectomy;  Surgeon: Angelia Mould, MD;  Location: [place];  Service: Vascular;  Laterality: Right;  . INSERT / REPLACE / REMOVE PACEMAKER     1st pacemaker placed 10+yrs ago  . PACEMAKER LEAD REMOVAL Right 07/19/2014   Procedure: PACEMAKER LEAD REMOVAL/EXTRACTION;  Surgeon: [name], MD;  Location: [place];  Service: Cardiovascular;  Laterality: Right;  DR. Lucianne Lei TRIGT TO BACK UP CASE  . PERMANENT PACEMAKER GENERATOR CHANGE [name] 08/27/2012   Procedure: PERMANENT PACEMAKER GENERATOR CHANGE;  Surgeon: [name], MD;  Location: [facility] CATH LAB;  Service: Cardiovascular;  Laterality: [name];    . TEE WITHOUT CARDIOVERSION  06/26/2012   Procedure: TRANSESOPHAGEAL ECHOCARDIOGRAM (TEE);  Surgeon: Lelon Perla, MD;  Location: [facility] ENDOSCOPY;  Service: Cardiovascular;  Laterality: [name];  . TEE WITHOUT CARDIOVERSION [name] 07/27/2014   Procedure: TRANSESOPHAGEAL ECHOCARDIOGRAM (TEE);  Surgeon: [name], MD;  Location: [facility] ENDOSCOPY;  Service: Cardiovascular;  Laterality: [name];     Family History  Problem Relation Age of Onset  . Hypertension Mother   . Colon cancer Mother   . Cancer Mother        Colon  . Alcohol abuse Father      Social History   Socioeconomic History  . Marital status: Married    Spouse name: Not on file  . Number of children: 0  . Years of education: Not on file  . Highest education level: Not on file  Occupational History  . Occupation: retired  Scientific laboratory technician  .  Financial resource strain: Not on file  . Food insecurity:    Worry: Not on file    Inability: Not on file  . Transportation needs:    Medical: Not on file    Non-medical: Not on file  Tobacco Use  . Smoking status: Former Smoker    Last attempt to quit: 02/10/1971    Years since quitting: 46.6  . Smokeless tobacco: Never Used  . Tobacco comment: quit smoking 77yrs ago  Substance and Sexual Activity  . Alcohol use: No    Comment: nothing in 25yrs   . Drug use: No  . Sexual activity: Never  Lifestyle  . Physical activity:    Days per week: Not on file    Minutes per session: Not on file  . Stress: Not on file  Relationships  . Social connections:    Talks on phone: Not on file    Gets together: Not on file    Attends religious service: Not on file    Active member of club or organization: Not on file    Attends meetings of clubs or organizations: Not on file    Relationship status: Not on file  . Intimate partner violence:    Fear of current or ex partner: Not on file    Emotionally abused: Not on file    Physically abused: Not on file    Forced sexual activity: Not on file   Other Topics Concern  . Not on file  Social History Narrative   Lives in [place] with wife.  Retired.     BP 100/62   Pulse 70   Ht 5\' 9"  (1.753 m)   Wt 176 lb 6.4 oz (80 kg)   SpO2 97%   BMI 26.05 kg/m   Physical Exam:  Well appearing elderly man, NAD HEENT: Unremarkable Neck:  6 cm JVD, no thyromegally Lymphatics:  No adenopathy Back:  No CVA tenderness Lungs:  Clear with no wheezes HEART:  Regular rate rhythm, no murmurs, no rubs, no clicks Abd:  soft, positive bowel sounds, no organomegally, no rebound, no guarding Ext:  2 plus pulses, no edema, no cyanosis, no clubbing Skin:  No rashes no nodules Neuro:  CN II through XII intact, motor grossly intact  DEVICE  Normal device function.  See PaceArt for details.   Assess/Plan: 1. CHB - he is stable, s/p PPM insertion 2.PPM - his biv PPM is working normally. Will recheck in several months. 3. Chronic systolic heart failure - he has class 2 symptoms, and is encouraged to increase his physical activity.   Marcus [name].D.

## 2017-10-10 NOTE — Patient Instructions (Signed)
Medication Instructions:  Your physician recommends that you continue on your current medications as directed. Please refer to the Current Medication list given to you today.  Labwork: None ordered.  Testing/Procedures: None ordered.  Follow-Up: Your physician wants you to follow-up in: one year with Dr. Lovena Le.   You will receive a reminder letter in the mail two months in advance. If you don't receive a letter, please call our office to schedule the follow-up appointment.  Remote monitoring is used to monitor your Pacemaker from home. This monitoring reduces the number of office visits required to check your device to one time per year. It allows [place] to keep an eye on the functioning of your device to ensure it is working properly. You are scheduled for a device check from home on 10/15/2017. You may send your transmission at any time that day. If you have a wireless device, the transmission will be sent automatically. After your physician reviews your transmission, you will receive a postcard with your next transmission date.  Any Other Special Instructions Will Be Listed Below (If Applicable).  If you need a refill on your cardiac medications before your next appointment, please call your pharmacy.

## 2017-10-11 LAB — CUP PACEART INCLINIC DEVICE CHECK
Date Time Interrogation Session: 20190524085539
Implantable Lead Implant Date: 20160304
Implantable Lead Implant Date: 20160304
Implantable Lead Location: 753858
Implantable Pulse Generator Implant Date: 20160304
MDC IDC LEAD IMPLANT DT: 20160304
MDC IDC LEAD LOCATION: 753859
MDC IDC LEAD LOCATION: 753860
Pulse Gen Model: 3242
Pulse Gen Serial Number: 7725037

## 2017-10-15 ENCOUNTER — Telehealth (HOSPITAL_COMMUNITY): Payer: Self-pay

## 2017-10-15 ENCOUNTER — Ambulatory Visit (INDEPENDENT_AMBULATORY_CARE_PROVIDER_SITE_OTHER): Payer: Medicare Other | Admitting: *Deleted

## 2017-10-15 DIAGNOSIS — Z950 Presence of cardiac pacemaker: Secondary | ICD-10-CM

## 2017-10-15 DIAGNOSIS — I495 Sick sinus syndrome: Secondary | ICD-10-CM | POA: Diagnosis not present

## 2017-10-15 DIAGNOSIS — I5022 Chronic systolic (congestive) heart failure: Secondary | ICD-10-CM

## 2017-10-15 NOTE — Addendum Note (Signed)
Addended by: Rosalene Billings on: 10/15/2017 06:06 PM   Modules accepted: Level of Service

## 2017-10-15 NOTE — Progress Notes (Signed)
Remote pacemaker transmission.   

## 2017-10-15 NOTE — Progress Notes (Addendum)
EPIC Encounter for ICM Monitoring  Patient Name: Marcus [name] is a 84 y.o. male Date: 10/15/2017 Primary Care Physican: Patient, No Pcp Per Primary Cardiologist:[place] Electrophysiologist: Lovena Le Dry Weight: Previous 166lb  Bi-V Pacing: >99%      Heart Failure questions reviewed, pt feeling tired and weak.  She will talk Dr Aundra Dubin regarding his low energy and how much he should be doing.     Thoracic impedance normal but was abnormal suggesting fluid accumulation from 09/24/2017 - 10/01/2017.  Prescribed dosage: Torsemide20 mg1 tablet every other day.If he gains weight/develops edema, can increase to 20 mg daily for a few daysper Dr Claris Gladden office note 05/31/2017.  Labs: 05/31/2017 Creatinine 2.74, BUN 66, Potassium 4.7, Sodium 138, EGFR 19-23 04/17/2017 Creatinine 2.56, BUN 60, Potassium 4.6, Sodium 136, EGFR 21-25 04/05/2017 Creatinine 2.83, BUN 66, Potassium4.1,Sodium 138, EGFR 19-22 02/08/2017 Creatinine 2.41, BUN 57, Potassium 4.4, Sodium 140, EGFR 23-27 01/09/2017 Creatinine 2.31, BUN 49, Potassium 4.1, Sodium 141, EGFR 24-28 12/05/2016 Creatinine 2.87, BUN 71, Potassium 4.3, Sodium 139, EGFR 19-22 11/22/2016 Creatinine 2.57, BUN 61, Potassium 4.3, Sodium 139, EGFR 21-25  06/14/2016 Creatinine 2.54. BUN 59, Potassium 4.9, Sodium 137, EGFR 22-25 06/01/2016 Creatinine 2.78, BUN 78, Potassium 4.5, Sodium 136, EGFR 19-22  05/22/2016 Creatinine 2.38, BUN 60, Potassium 4.7, Sodium 136, EGFR 23-27 05/08/2016 Creatinine 2.16, BUN 40, Potassium 4.5, Sodium 143, EGFR 26-30 03/26/2016 Creatinine 1.77, BUN 26, Potassium 4.3, Sodium 140, EGFR 33-39 02/21/2016 Creatinine 2.45, BUN 75, Potassium 5.0, Sodium 138, EGFR 23-26  10/05/2016Creatinine 2.29, BUN 49, Potassium 4.8, Sodium 137, EGFR 25-29  Recommendations: No changes.   Encouraged to call for fluid symptoms.    Follow-up plan: ICM clinic phone appointment on 11/18/2017.  Office appointment scheduled 10/17/2017 Dr.  Aundra Dubin.  Copy of ICM check sent to Dr. Aundra Dubin.   3 month ICM trend: 10/15/2017    1 Year ICM trend:       Rosalene Billings, RN 10/15/2017 5:54 PM

## 2017-10-15 NOTE — Telephone Encounter (Signed)
Patient's wife calling concerned about patient's general status this past week. States he is increasingly weak, fatigued, tired, sleeping more, and unsteady on his feet. States "I cant pinpoint anything in particular but hes just not acting right". Vitals and exam from last weeks appt with Dr. Lovena Le stable. Patient added on to Dr. Claris Gladden clinic this week on available slot. Advised wife to go to ED if s/s worsen before appt. Aware and agreeable.  Renee Pain, RN

## 2017-10-17 ENCOUNTER — Encounter (HOSPITAL_COMMUNITY): Payer: Self-pay | Admitting: Cardiology

## 2017-10-17 ENCOUNTER — Ambulatory Visit (HOSPITAL_COMMUNITY)
Admission: RE | Admit: 2017-10-17 | Discharge: 2017-10-17 | Disposition: A | Discharge status: Home / Self Care | Payer: Medicare Other | Source: Ambulatory Visit | Attending: Cardiology | Admitting: Cardiology

## 2017-10-17 VITALS — BP 118/76 | HR 73 | Wt 176.0 lb

## 2017-10-17 DIAGNOSIS — I255 Ischemic cardiomyopathy: Secondary | ICD-10-CM | POA: Insufficient documentation

## 2017-10-17 DIAGNOSIS — N183 Chronic kidney disease, stage 3 unspecified: Secondary | ICD-10-CM

## 2017-10-17 DIAGNOSIS — I480 Paroxysmal atrial fibrillation: Secondary | ICD-10-CM | POA: Insufficient documentation

## 2017-10-17 DIAGNOSIS — I130 Hypertensive heart and chronic kidney disease with heart failure and stage 1 through stage 4 chronic kidney disease, or unspecified chronic kidney disease: Secondary | ICD-10-CM | POA: Diagnosis not present

## 2017-10-17 DIAGNOSIS — Z79899 Other long term (current) drug therapy: Secondary | ICD-10-CM | POA: Insufficient documentation

## 2017-10-17 DIAGNOSIS — I5022 Chronic systolic (congestive) heart failure: Secondary | ICD-10-CM | POA: Insufficient documentation

## 2017-10-17 DIAGNOSIS — Z8249 Family history of ischemic heart disease and other diseases of the circulatory system: Secondary | ICD-10-CM | POA: Diagnosis not present

## 2017-10-17 DIAGNOSIS — Z8546 Personal history of malignant neoplasm of prostate: Secondary | ICD-10-CM | POA: Diagnosis not present

## 2017-10-17 DIAGNOSIS — Z7901 Long term (current) use of anticoagulants: Secondary | ICD-10-CM | POA: Insufficient documentation

## 2017-10-17 DIAGNOSIS — I495 Sick sinus syndrome: Secondary | ICD-10-CM | POA: Insufficient documentation

## 2017-10-17 DIAGNOSIS — E785 Hyperlipidemia, unspecified: Secondary | ICD-10-CM | POA: Diagnosis not present

## 2017-10-17 DIAGNOSIS — Z951 Presence of aortocoronary bypass graft: Secondary | ICD-10-CM | POA: Insufficient documentation

## 2017-10-17 DIAGNOSIS — I442 Atrioventricular block, complete: Secondary | ICD-10-CM | POA: Diagnosis not present

## 2017-10-17 DIAGNOSIS — Z950 Presence of cardiac pacemaker: Secondary | ICD-10-CM | POA: Diagnosis not present

## 2017-10-17 DIAGNOSIS — I2510 Atherosclerotic heart disease of native coronary artery without angina pectoris: Secondary | ICD-10-CM | POA: Diagnosis not present

## 2017-10-17 LAB — CBC
HEMATOCRIT: 38.1 % — AB (ref 39.0–52.0)
HEMOGLOBIN: 11.9 g/dL — AB (ref 13.0–17.0)
MCH: 31.2 pg (ref 26.0–34.0)
MCHC: 31.2 g/dL (ref 30.0–36.0)
MCV: 100 fL (ref 78.0–100.0)
Platelets: 197 10*3/uL (ref 150–400)
RBC: 3.81 MIL/uL — AB (ref 4.22–5.81)
RDW: 14.5 % (ref 11.5–15.5)
WBC: 5.1 10*3/uL (ref 4.0–10.5)

## 2017-10-17 LAB — COMPREHENSIVE METABOLIC PANEL
ALT: 11 U/L — AB (ref 17–63)
ANION GAP: 8 (ref 5–15)
AST: 15 U/L (ref 15–41)
Albumin: 3.6 g/dL (ref 3.5–5.0)
Alkaline Phosphatase: 37 U/L — ABNORMAL LOW (ref 38–126)
BUN: 64 mg/dL — ABNORMAL HIGH (ref 6–20)
CHLORIDE: 107 mmol/L (ref 101–111)
CO2: 22 mmol/L (ref 22–32)
CREATININE: 2.96 mg/dL — AB (ref 0.61–1.24)
Calcium: 9.9 mg/dL (ref 8.9–10.3)
GFR calc Af Amer: 21 mL/min — ABNORMAL LOW (ref >60)
GFR calc non Af Amer: 18 mL/min — ABNORMAL LOW (ref >60)
Glucose, Bld: 94 mg/dL (ref 65–99)
POTASSIUM: 4.8 mmol/L (ref 3.5–5.1)
SODIUM: 137 mmol/L (ref 135–145)
Total Bilirubin: 0.7 mg/dL (ref 0.3–1.2)
Total Protein: 8 g/dL (ref 6.5–8.1)

## 2017-10-17 LAB — TSH: TSH: 1.245 u[IU]/mL (ref 0.350–4.500)

## 2017-10-17 MED ORDER — TORSEMIDE 20 MG PO TABS: 20.0000 mg | ORAL_TABLET | Freq: Every day | ORAL | 3 refills | Status: DC

## 2017-10-17 NOTE — Progress Notes (Signed)
Advanced Heart Failure Clinic Note   Patient ID: Marcus [name], male   DOB: 05/14/1929, 84 y.o.   MRN: [id number] EP: Dr Lovena Le  Cardiology: Aundra Dubin  84 y.o. with history of pacemaker for bradycardia, paroxysmal atrial fibrillation, CAD s/p CABG and PCIs, and ischemic cardiomyopathy EF 20-25%. Underwent CRT-D upgrade 10/08/13 due to 85% RV pacing.     Admitted 5/19-5/26/15 with volume overload. He was diuresed with milrinone and IV lasix and discharge weight was 153 lbs. He underwent upgrade of his ICD to CRT-D as well.  He was able to wean off milrinone and did much better after CRT upgrade.  He was admitted in 3/16 with pacemaker pocket infection.  CRT-D device was explanted and temporary-permanent pacemaker with placed.  He later had a St Jude CRT-P system re-implanted.  He was noted to be back in atrial fibrillation and had TEE-guided DCCV to NSR.  TEE showed EF 25-30%.    RHC in 10/17 showed near-normal filling pressures and low but not markedly low cardiac output.  Coreg was cut back to 6.25 mg bid.  Echo in 10/17 showed improvement in LV function, EF 35-40%.  Echo in 1/19 showed EF 35-40%, inferior/inferolateral akinesis.   He returns today for followup of CHF.  Weight is up 3 lbs.  He is short of breath with inclines and longer walks.  He is short of breath pulling his garbage cans to the street.  No orthopnea/PND. No chest pain.  He has some trouble with balance but will not use a cane.  No falls.  Poor energy level/fatigue.    St Jude device interrogated: thoracic impedance with generally a decreasing trend, evidence for recent volume overload.   Labs (3/16): K 4.1 => 4.6, creatinine 1.87 => 1.97, HCT 34.4, TSH normal, LFTs normal.  Labs (6/16): K 4.2, creatinine 2.34 Labs (10/16): K 4.8, creatinine 2.29 Labs (6/17): K 5.1, creatinine 1.89, AST 30, ALT 25, TSH normal, LDL 159, HCT 37 Labs (10/17): K 5, creatinine 2.45, TSH normal, hgb 12.7, BNP 453 Labs (11/17): LFTs normal Labs  (12/17): hgb 11.7, creatinine 2.16 Labs (1/18): K 4.9, creatinine 2.54 Labs (7/18): K 4.3, creatinine 2.87, hgb 12.5 Labs (8/18): K 4.1, creatinine 2.31, LFTs normal, BNP 816, TSH normal Labs (9/18): K 4.4, creatinine 2.41, BNP 761, hgb 12.8 Labs (11/18): K 4.6, creatinine 2.56 Labs (1/19): hgb 13, K 4.7, creatinine 2.74  ECG: A-BiV paced (personally reviewed)  PMH:  1. Symptomatic bradycardia: Medtronic CRT-D.  He developed pocket infection in 3/16 and had device extracted.  He had St Jude CRT-P system replaced.  2. HTN 3. Hyperlipidemia: Has refused statins.  4. Atrial fibrillation: History of prior DCCV.  Paroxysmal.  On Eliquis.  Has history of cardioembolism to right leg with right popliteal and tibial embolectomy (had been off Eliquis). Has had nausea with amiodarone in the past but now tolerating.  Recurrence in 3/16, had TEE-guided DCCV.  5. Prostate cancer 6. CAD: CABG remotely with SVG-OM and LIMA-LAD.  Late 1990s had PCI to RCA.  In 12/09 had LHC with total occlusion of SVG-OM, patent LIMA-LAD, DES to LCx and DES to RCA.   7. Ischemic cardiomyopathy: Echo (3/15) with EF 20-25%, wall motion abnormalities noted, mild to moderately decreased RV systolic function. ICD upgraded to [place] CRT-D 10/08/13.  TEE (3/16) with EF 25-30%, mildly dilated RV with mildly decreased systolic function, mild to moderate MR.  - RHC (10/17): mean RA 4, PA 44/21 mean 31, unable to wedge, CI 2.0. -  Echo (10/17): EF 35-40%, moderate dilation, inferior/inferolateral AK, moderate MR, RV mildly dilated with mildly decreased systolic function.  8. CKD  SH: Married, nonsmoker, lives in [place]  [place]: CAD  ROS: All systems reviewed and negative except as per HPI.   Current Outpatient Medications  Medication Sig Dispense Refill  . amiodarone (PACERONE) 200 MG tablet TAKE 1/2 TABLET (100MG      TOTAL) DAILY 45 tablet 3  . apixaban (ELIQUIS) 2.5 MG TABS tablet Take 1 tablet (2.5 mg total) by mouth 2 (two)  times daily. 180 tablet 3  . carvedilol (COREG) 6.25 MG tablet TAKE 1 TABLET BY MOUTH 2 TIMES A DAY WITH MEALS 180 tablet 1  . Coenzyme Q10 (CO Q 10) 100 MG CAPS Take 1 capsule by mouth 2 (two) times daily.    . Multiple Vitamins-Minerals (MULTIVITAMINS/MINERALS ADULT PO) Take by mouth daily. MacuGuard    . Multiple Vitamins-Minerals (PRESERVISION AREDS 2 PO) Take 1 capsule by mouth 2 (two) times daily.    Marland Kitchen spironolactone (ALDACTONE) 25 MG tablet TAKE ONE-HALF TABLET BY MOUTH ONCE DAILY 45 tablet 3  . torsemide (DEMADEX) 20 MG tablet Take 1 tablet (20 mg total) by mouth daily. 90 tablet 3   No current facility-administered medications for this encounter.     Vitals:   10/17/17 1136  BP: 118/76  Pulse: 73  SpO2: 97%  Weight: 176 lb (79.8 kg)   Wt Readings from Last 3 Encounters:  10/17/17 176 lb (79.8 kg)  10/10/17 176 lb 6.4 oz (80 kg)  05/31/17 173 lb 12 oz (78.8 kg)    General: NAD Neck: JVP 8-9 cm with HJR, no thyromegaly or thyroid nodule.  Lungs: Clear to auscultation bilaterally with normal respiratory effort. CV: Nondisplaced PMI.  Heart regular S1/S2, no S3/S4, no murmur.  1+ ankle edema.  No carotid bruit.  Normal pedal pulses.  Abdomen: Soft, nontender, no hepatosplenomegaly, no distention.  Skin: Intact without lesions or rashes.  Neurologic: Alert and oriented x 3.  Psych: Normal affect. Extremities: No clubbing or cyanosis.  HEENT: Normal.   Assessment/Plan:  1. Chronic systolic CHF: Ischemic cardiomyopathy.  Echo in 1/19 showed EF 35-40% (stable from most recent prior).  Has St Jude CRT-P device.  NYHA class II-III symptoms, stable. He is volume overloaded by exam and Corevue.  - Torsemide 40 mg daily x 2 days then 20 mg daily after that.  BMET today and again in 10 days.   - Continue current spironolactone 12.5 daily and Coreg 6.25 mg bid.   - Not on ACEI/ARB/ARNI with CKD. - I think he can stop Imdur.  2. Atrial fibrillation: Paroxysmal. He is in NSR on  amiodarone 100 mg daily. - Check LFTs and TSH today.  Needs regular eye exam. - He is on Eliquis 2.5 mg bid (age, elevated creatinine). CBC today.  3. CAD: Stable. No chest pain. Continue medical management.  - He is not currently on a statin (he has refused to take a statin).   - He is not on ASA given use of Eliquis and stable CAD.  4. Sick sinus syndrome: s/p CRT-P. 5. CKD, stage III: BMET today.     Followup in 2 months.   [name] 10/17/2017

## 2017-10-17 NOTE — Patient Instructions (Signed)
Labs today (will call for abnormal results, otherwise no news is good news)  STOP taking Imdur  INCREASE Torsemide to 40 mg (2 Tablets) Once Daily for TWO DAYS ONLY.  Then start taking torsemide 20 mg (1 Tablet) Once Daily.  Labs in 10 days (bmet)  Follow up in 2 Months

## 2017-10-18 ENCOUNTER — Encounter: Payer: Self-pay | Admitting: Cardiology

## 2017-10-25 ENCOUNTER — Ambulatory Visit (HOSPITAL_COMMUNITY)
Admission: RE | Admit: 2017-10-25 | Discharge: 2017-10-25 | Disposition: A | Discharge status: Home / Self Care | Payer: Medicare Other | Source: Ambulatory Visit | Attending: Cardiology | Admitting: Cardiology

## 2017-10-25 DIAGNOSIS — I5022 Chronic systolic (congestive) heart failure: Secondary | ICD-10-CM | POA: Diagnosis not present

## 2017-10-25 LAB — BASIC METABOLIC PANEL
Anion gap: 11 (ref 5–15)
BUN: 94 mg/dL — AB (ref 6–20)
CO2: 26 mmol/L (ref 22–32)
Calcium: 10 mg/dL (ref 8.9–10.3)
Chloride: 102 mmol/L (ref 101–111)
Creatinine, Ser: 3.28 mg/dL — ABNORMAL HIGH (ref 0.61–1.24)
GFR calc Af Amer: 18 mL/min — ABNORMAL LOW (ref >60)
GFR, EST NON AFRICAN AMERICAN: 16 mL/min — AB (ref >60)
GLUCOSE: 118 mg/dL — AB (ref 65–99)
POTASSIUM: 4.6 mmol/L (ref 3.5–5.1)
Sodium: 139 mmol/L (ref 135–145)

## 2017-10-31 LAB — CUP PACEART REMOTE DEVICE CHECK
Battery Remaining Longevity: 46 mo
Battery Remaining Percentage: 95.5 %
Battery Voltage: 2.96 V
Brady Statistic AP VS Percent: 1 %
Brady Statistic RA Percent Paced: 99 %
Implantable Lead Implant Date: 20160304
Implantable Lead Implant Date: 20160304
Implantable Lead Location: 753859
Implantable Lead Location: 753860
Implantable Pulse Generator Implant Date: 20160304
Lead Channel Impedance Value: 450 Ohm
Lead Channel Impedance Value: 860 Ohm
Lead Channel Pacing Threshold Amplitude: 1.75 V
Lead Channel Pacing Threshold Pulse Width: 1 ms
Lead Channel Sensing Intrinsic Amplitude: 1.5 mV
Lead Channel Sensing Intrinsic Amplitude: 12 mV
Lead Channel Setting Pacing Amplitude: 2 V
Lead Channel Setting Pacing Amplitude: 2 V
Lead Channel Setting Pacing Amplitude: 3.25 V
Lead Channel Setting Pacing Pulse Width: 0.5 ms
Lead Channel Setting Pacing Pulse Width: 0.6 ms
MDC IDC LEAD IMPLANT DT: 20160304
MDC IDC LEAD LOCATION: 753858
MDC IDC MSMT LEADCHNL LV PACING THRESHOLD AMPLITUDE: 0.875 V
MDC IDC MSMT LEADCHNL LV PACING THRESHOLD PULSEWIDTH: 0.6 ms
MDC IDC MSMT LEADCHNL RV IMPEDANCE VALUE: 460 Ohm
MDC IDC MSMT LEADCHNL RV PACING THRESHOLD AMPLITUDE: 1 V
MDC IDC MSMT LEADCHNL RV PACING THRESHOLD PULSEWIDTH: 0.5 ms
MDC IDC SESS DTM: 20190528060015
MDC IDC SET LEADCHNL RV SENSING SENSITIVITY: 5 mV
MDC IDC STAT BRADY AP VP PERCENT: 99 %
MDC IDC STAT BRADY AS VP PERCENT: 1 %
MDC IDC STAT BRADY AS VS PERCENT: 0 %
Pulse Gen Model: 3242
Pulse Gen Serial Number: 7725037

## 2017-11-05 ENCOUNTER — Encounter (HOSPITAL_COMMUNITY): Payer: Self-pay

## 2017-11-05 ENCOUNTER — Telehealth (HOSPITAL_COMMUNITY): Payer: Self-pay

## 2017-11-05 NOTE — Telephone Encounter (Signed)
Notes recorded by Shirley Muscat, RN on 11/05/2017 at 10:44 AM EDT I have been unable to reach this patient by phone. A letter is being sent.  ------  Notes recorded by Harvie Junior, CMA on 10/31/2017 at 2:56 PM EDT Left VM for pt to call for lab results. ------  Notes recorded by Shirley Muscat, RN on 10/28/2017 at 3:24 PM EDT Left message to call back   ------  Notes recorded by [name], MD on 10/28/2017 at 12:11 AM EDT Creatinine higher. Go back to every other day torsemide 20 mg. However, if weight rises by 2-3 lbs in 24 hrs or 3 lbs in a week, he can take torsemide 20 mg daily x 4 days.

## 2017-11-15 NOTE — Addendum Note (Signed)
Encounter addended by: [name], MD on: 11/15/2017 6:24 PM  Actions taken: LOS modified

## 2017-11-26 ENCOUNTER — Telehealth: Payer: Self-pay | Admitting: Cardiology

## 2017-11-26 ENCOUNTER — Ambulatory Visit (INDEPENDENT_AMBULATORY_CARE_PROVIDER_SITE_OTHER): Payer: Medicare Other

## 2017-11-26 DIAGNOSIS — Z950 Presence of cardiac pacemaker: Secondary | ICD-10-CM | POA: Diagnosis not present

## 2017-11-26 DIAGNOSIS — I5022 Chronic systolic (congestive) heart failure: Secondary | ICD-10-CM

## 2017-11-26 NOTE — Telephone Encounter (Signed)
Confirmed remote transmission w/ pt wife.   

## 2017-11-28 ENCOUNTER — Telehealth: Payer: Self-pay

## 2017-11-28 NOTE — Telephone Encounter (Signed)
Remote ICM transmission received.  Attempted call to patient and left message to return call. 

## 2017-11-28 NOTE — Progress Notes (Addendum)
EPIC Encounter for ICM Monitoring  Patient Name: Marcus [name] is a 84 y.o. male Date: 11/28/2017 Primary Care Physican: Patient, No Pcp Per Primary [place] Electrophysiologist: Lovena Le Dry Weight: 176lb office weight Bi-V Pacing: >99%      Attempted call to patient and unable to reach.  Left message to return call.  Transmission reviewed.    Thoracic impedance normal but was abnormal suggesting fluid accumulation from 10/31/2017 - 11/22/2017.  Prescribed dosage: Torsemide20 mg1 tablet every other day.If weight rises by 2-3 lbs in 24 hrs or 3 lbs in a week, he can take torsemide 20 mg daily x 4 days per lab note on 10/25/2017 by Dr Aundra Dubin.  Labs: 10/25/2017 Creatinine 3.28, BUN 94, Potassium 4.6, Sodium 139, EGFR 16-18 10/17/2017 Creatinine 2.96, BUN 64, Potassium 4.8, Sodium 137, EGFR 18-21 05/31/2017 Creatinine 2.74, BUN 66, Potassium 4.7, Sodium 138, EGFR 19-23 04/17/2017 Creatinine 2.56, BUN 60, Potassium 4.6, Sodium 136, EGFR 21-25 04/05/2017 Creatinine 2.83, BUN 66, Potassium4.1,Sodium 138, EGFR 19-22 02/08/2017 Creatinine 2.41, BUN 57, Potassium 4.4, Sodium 140, EGFR 23-27 01/09/2017 Creatinine 2.31, BUN 49, Potassium 4.1, Sodium 141, EGFR 24-28 12/05/2016 Creatinine 2.87, BUN 71, Potassium 4.3, Sodium 139, EGFR 19-22 11/22/2016 Creatinine 2.57, BUN 61, Potassium 4.3, Sodium 139, EGFR 21-25  Recommendations:  NONE - Unable to reach.  Follow-up plan: ICM clinic phone appointment on 12/30/2017.   Office appointment scheduled 12/18/2017 with Dr Aundra Dubin.    Copy of ICM check sent to Dr. Lovena Le.   3 month ICM trend: 11/28/2017    1 Year ICM trend:       Rosalene Billings, RN 11/28/2017 9:35 AM

## 2017-12-03 ENCOUNTER — Other Ambulatory Visit (HOSPITAL_COMMUNITY): Payer: Self-pay

## 2017-12-03 MED ORDER — APIXABAN 2.5 MG PO TABS: 2.5000 mg | ORAL_TABLET | Freq: Two times a day (BID) | ORAL | 2 refills | Status: DC

## 2017-12-05 NOTE — Progress Notes (Signed)
Spoke with wife. He has good days and days that he does not feel as well.  Overall he is about the same.  Transmission reviewed.  Does not currently have any fluid symptoms.  He has an appt with Dr Aundra Dubin on 12/18/2017.  Next ICM remote transmission is scheduled for 12/30/2017

## 2017-12-11 ENCOUNTER — Other Ambulatory Visit (HOSPITAL_COMMUNITY): Payer: Self-pay | Admitting: Internal Medicine

## 2017-12-18 ENCOUNTER — Ambulatory Visit (HOSPITAL_COMMUNITY)
Admission: RE | Admit: 2017-12-18 | Discharge: 2017-12-18 | Disposition: A | Discharge status: Home / Self Care | Payer: Medicare Other | Source: Ambulatory Visit | Attending: Cardiology | Admitting: Cardiology

## 2017-12-18 ENCOUNTER — Telehealth (HOSPITAL_COMMUNITY): Payer: Self-pay | Admitting: *Deleted

## 2017-12-18 VITALS — BP 104/71 | HR 74 | Wt 173.6 lb

## 2017-12-18 DIAGNOSIS — N184 Chronic kidney disease, stage 4 (severe): Secondary | ICD-10-CM

## 2017-12-18 DIAGNOSIS — I480 Paroxysmal atrial fibrillation: Secondary | ICD-10-CM

## 2017-12-18 DIAGNOSIS — I5022 Chronic systolic (congestive) heart failure: Secondary | ICD-10-CM | POA: Insufficient documentation

## 2017-12-18 LAB — COMPREHENSIVE METABOLIC PANEL
ALBUMIN: 4 g/dL (ref 3.5–5.0)
ALK PHOS: 39 U/L (ref 38–126)
ALT: 10 U/L (ref 0–44)
ANION GAP: 9 (ref 5–15)
AST: 15 U/L (ref 15–41)
BILIRUBIN TOTAL: 1 mg/dL (ref 0.3–1.2)
BUN: 65 mg/dL — ABNORMAL HIGH (ref 8–23)
CALCIUM: 9.6 mg/dL (ref 8.9–10.3)
CO2: 27 mmol/L (ref 22–32)
CREATININE: 3.11 mg/dL — AB (ref 0.61–1.24)
Chloride: 102 mmol/L (ref 98–111)
GFR calc Af Amer: 19 mL/min — ABNORMAL LOW (ref >60)
GFR calc non Af Amer: 17 mL/min — ABNORMAL LOW (ref >60)
GLUCOSE: 104 mg/dL — AB (ref 70–99)
Potassium: 5.7 mmol/L — ABNORMAL HIGH (ref 3.5–5.1)
SODIUM: 138 mmol/L (ref 135–145)
TOTAL PROTEIN: 8 g/dL (ref 6.5–8.1)

## 2017-12-18 LAB — CBC
HCT: 40.4 % (ref 39.0–52.0)
Hemoglobin: 12.5 g/dL — ABNORMAL LOW (ref 13.0–17.0)
MCH: 30.9 pg (ref 26.0–34.0)
MCHC: 30.9 g/dL (ref 30.0–36.0)
MCV: 100 fL (ref 78.0–100.0)
PLATELETS: 152 10*3/uL (ref 150–400)
RBC: 4.04 MIL/uL — ABNORMAL LOW (ref 4.22–5.81)
RDW: 14.2 % (ref 11.5–15.5)
WBC: 5.2 10*3/uL (ref 4.0–10.5)

## 2017-12-18 LAB — TSH: TSH: 1.66 u[IU]/mL (ref 0.350–4.500)

## 2017-12-18 NOTE — Telephone Encounter (Signed)
Result Notes for TSH   Notes recorded by Darron Doom, RN on 12/18/2017 at 4:17 PM EDT Called and spoke with patient's wife and she will have him stop spiro and lab appt scheduled for Friday. bmet ordered. ------  Notes recorded by [name], MD on 12/18/2017 at 4:01 PM EDT K is high. Stop spironolactone. Repeat BMET Friday.

## 2017-12-18 NOTE — Patient Instructions (Signed)
Labs today (will call for abnormal results, otherwise no news is good news)  Follow up in 3 months.

## 2017-12-18 NOTE — Progress Notes (Signed)
Advanced Heart Failure Clinic Note   Patient ID: Marcus [name], male   DOB: 1928/08/01, 84 y.o.   MRN: [id number] EP: Dr Lovena Le  Cardiology: Aundra Dubin  84 y.o. with history of pacemaker for bradycardia, paroxysmal atrial fibrillation, CAD s/p CABG and PCIs, and ischemic cardiomyopathy EF 20-25%. Underwent CRT-D upgrade 10/08/13 due to 85% RV pacing.     Admitted 5/19-5/26/15 with volume overload. He was diuresed with milrinone and IV lasix and discharge weight was 153 lbs. He underwent upgrade of his ICD to CRT-D as well.  He was able to wean off milrinone and did much better after CRT upgrade.  He was admitted in 3/16 with pacemaker pocket infection.  CRT-D device was explanted and temporary-permanent pacemaker with placed.  He later had a St Jude CRT-P system re-implanted.  He was noted to be back in atrial fibrillation and had TEE-guided DCCV to NSR.  TEE showed EF 25-30%.    RHC in 10/17 showed near-normal filling pressures and low but not markedly low cardiac output.  Coreg was cut back to 6.25 mg bid.  Echo in 10/17 showed improvement in LV function, EF 35-40%.  Echo in 1/19 showed EF 35-40%, inferior/inferolateral akinesis.   He returns today for followup of CHF.  Weight is down 3 lbs. Lightheadedness resolved after stopping Imdur.  Breathing ok when walking on flat ground.  He is short of breath walking up 1 flight stairs or up a hill. No orthopnea/PND. No BRBPR/melena.  With recent rise in creatinine, torsemide was cut back to 20 mg daily.  He feels like this is keeping his volume stable.   St Jude device interrogated: Stable thoracic impedance, > 99% BiV pacing, < 1% atrial fibrillation.   Labs (3/16): K 4.1 => 4.6, creatinine 1.87 => 1.97, HCT 34.4, TSH normal, LFTs normal.  Labs (6/16): K 4.2, creatinine 2.34 Labs (10/16): K 4.8, creatinine 2.29 Labs (6/17): K 5.1, creatinine 1.89, AST 30, ALT 25, TSH normal, LDL 159, HCT 37 Labs (10/17): K 5, creatinine 2.45, TSH normal, hgb 12.7,  BNP 453 Labs (11/17): LFTs normal Labs (12/17): hgb 11.7, creatinine 2.16 Labs (1/18): K 4.9, creatinine 2.54 Labs (7/18): K 4.3, creatinine 2.87, hgb 12.5 Labs (8/18): K 4.1, creatinine 2.31, LFTs normal, BNP 816, TSH normal Labs (9/18): K 4.4, creatinine 2.41, BNP 761, hgb 12.8 Labs (11/18): K 4.6, creatinine 2.56 Labs (1/19): hgb 13, K 4.7, creatinine 2.74 Labs (5/19): TSH normal, LFTs normal Labs (6/19): K 4.6, creatinine 3.28  PMH:  1. Symptomatic bradycardia: Medtronic CRT-D.  He developed pocket infection in 3/16 and had device extracted.  He had St Jude CRT-P system replaced.  2. HTN 3. Hyperlipidemia: Has refused statins.  4. Atrial fibrillation: History of prior DCCV.  Paroxysmal.  On Eliquis.  Has history of cardioembolism to right leg with right popliteal and tibial embolectomy (had been off Eliquis). Has had nausea with amiodarone in the past but now tolerating.  Recurrence in 3/16, had TEE-guided DCCV.  5. Prostate cancer 6. CAD: CABG remotely with SVG-OM and LIMA-LAD.  Late 1990s had PCI to RCA.  In 12/09 had LHC with total occlusion of SVG-OM, patent LIMA-LAD, DES to LCx and DES to RCA.   7. Ischemic cardiomyopathy: Echo (3/15) with EF 20-25%, wall motion abnormalities noted, mild to moderately decreased RV systolic function. ICD upgraded to [place] CRT-D 10/08/13.  TEE (3/16) with EF 25-30%, mildly dilated RV with mildly decreased systolic function, mild to moderate MR.  - RHC (10/17): mean RA  4, PA 44/21 mean 31, unable to wedge, CI 2.0. - Echo (10/17): EF 35-40%, moderate dilation, inferior/inferolateral AK, moderate MR, RV mildly dilated with mildly decreased systolic function.  - Echo (1/19): EF 35-40%, inferior/inferolateral akinesis. 8. CKD stage IV  SH: Married, nonsmoker, lives in [place]  FH: CAD  ROS: All systems reviewed and negative except as per HPI.   Current Outpatient Medications  Medication Sig Dispense Refill  . amiodarone (PACERONE) 200 MG tablet  TAKE 1/2 TABLET (100MG      TOTAL) DAILY 45 tablet 3  . apixaban (ELIQUIS) 2.5 MG TABS tablet Take 1 tablet (2.5 mg total) by mouth 2 (two) times daily. 180 tablet 2  . carvedilol (COREG) 6.25 MG tablet TAKE 1 TABLET BY MOUTH 2 TIMES A DAY WITH MEALS 180 tablet 1  . Coenzyme Q10 (CO Q 10) 100 MG CAPS Take 1 capsule by mouth 2 (two) times daily.    . Multiple Vitamins-Minerals (MULTIVITAMINS/MINERALS ADULT PO) Take by mouth daily. MacuGuard    . Multiple Vitamins-Minerals (PRESERVISION AREDS 2 PO) Take 1 capsule by mouth 2 (two) times daily.    Marland Kitchen torsemide (DEMADEX) 20 MG tablet Take 1 tablet (20 mg total) by mouth daily. 90 tablet 3   No current facility-administered medications for this encounter.     Vitals:   12/18/17 1056  BP: 104/71  Pulse: 74  SpO2: 100%  Weight: 173 lb 9.6 oz (78.7 kg)   Wt Readings from Last 3 Encounters:  12/18/17 173 lb 9.6 oz (78.7 kg)  10/17/17 176 lb (79.8 kg)  10/10/17 176 lb 6.4 oz (80 kg)    General: NAD Neck: No JVD, no thyromegaly or thyroid nodule.  Lungs: Clear to auscultation bilaterally with normal respiratory effort. CV: Nondisplaced PMI.  Heart regular S1/S2, no S3/S4, no murmur.  No peripheral edema.  No carotid bruit.  Normal pedal pulses.  Abdomen: Soft, nontender, no hepatosplenomegaly, no distention.  Skin: Intact without lesions or rashes.  Neurologic: Alert and oriented x 3.  Psych: Normal affect. Extremities: No clubbing or cyanosis.  HEENT: Normal.   Assessment/Plan:  1. Chronic systolic CHF: Ischemic cardiomyopathy.  Echo in 1/19 showed EF 35-40% (stable from most recent prior).  Has St Jude CRT-P device.  NYHA class II-III symptoms, stable. He is not volume overloaded by exam or Corevue, and weight is down 3 lbs.  - Continue torsemide 20 mg daily.    - Continue current spironolactone 12.5 daily and Coreg 6.25 mg bid.  Will get BMET today.  If creatinine remains > 3 or K is high, will likely need to stop spironolactone.  - Not  on ACEI/ARB/ARNI with CKD. 2. Atrial fibrillation: Paroxysmal. He is in NSR on amiodarone 100 mg daily.  Minimal atrial fibrillation on device interrogation.  - Check LFTs and TSH today.  Needs regular eye exam. - He is on Eliquis 2.5 mg bid (age, elevated creatinine). CBC today. 3. CAD: Stable. No chest pain. Continue medical management.  - He is not currently on a statin (he has refused to take a statin).   - He is not on ASA given use of Eliquis and stable CAD.  4. Sick sinus syndrome: s/p CRT-P. 5. CKD, stage IV: BMET today.     Followup in 3 months.   [name] 12/18/2017

## 2017-12-20 ENCOUNTER — Ambulatory Visit (HOSPITAL_COMMUNITY)
Admission: RE | Admit: 2017-12-20 | Discharge: 2017-12-20 | Disposition: A | Discharge status: Home / Self Care | Payer: Medicare Other | Source: Ambulatory Visit | Attending: Internal Medicine | Admitting: Internal Medicine

## 2017-12-20 DIAGNOSIS — I5022 Chronic systolic (congestive) heart failure: Secondary | ICD-10-CM | POA: Insufficient documentation

## 2017-12-20 LAB — BASIC METABOLIC PANEL
Anion gap: 10 (ref 5–15)
BUN: 70 mg/dL — ABNORMAL HIGH (ref 8–23)
CO2: 27 mmol/L (ref 22–32)
Calcium: 10 mg/dL (ref 8.9–10.3)
Chloride: 103 mmol/L (ref 98–111)
Creatinine, Ser: 3.28 mg/dL — ABNORMAL HIGH (ref 0.61–1.24)
GFR calc Af Amer: 18 mL/min — ABNORMAL LOW (ref >60)
GFR calc non Af Amer: 16 mL/min — ABNORMAL LOW (ref >60)
GLUCOSE: 95 mg/dL (ref 70–99)
Potassium: 5.2 mmol/L — ABNORMAL HIGH (ref 3.5–5.1)
Sodium: 140 mmol/L (ref 135–145)

## 2017-12-23 ENCOUNTER — Telehealth (HOSPITAL_COMMUNITY): Payer: Self-pay | Admitting: *Deleted

## 2017-12-23 DIAGNOSIS — I5022 Chronic systolic (congestive) heart failure: Secondary | ICD-10-CM

## 2017-12-23 NOTE — Telephone Encounter (Signed)
Result Notes for Basic metabolic panel   Notes recorded by Darron Doom, RN on 12/23/2017 at 9:33 AM EDT Patient's wife called back and she's aware. Appt scheduled and bmet ordered. ------  Notes recorded by Darron Doom, RN on 12/23/2017 at 9:24 AM EDT Called and left message on both lines asking for them to return our phone call. ------  Notes recorded by [name], MD on 12/22/2017 at 12:59 AM EDT K better, repeat BMET in 5 days. Low K diet.

## 2017-12-26 ENCOUNTER — Other Ambulatory Visit (HOSPITAL_COMMUNITY): Payer: Medicare Other

## 2017-12-27 ENCOUNTER — Ambulatory Visit (HOSPITAL_COMMUNITY)
Admission: RE | Admit: 2017-12-27 | Discharge: 2017-12-27 | Disposition: A | Discharge status: Home / Self Care | Payer: Medicare Other | Source: Ambulatory Visit | Attending: Cardiology | Admitting: Cardiology

## 2017-12-27 DIAGNOSIS — I5022 Chronic systolic (congestive) heart failure: Secondary | ICD-10-CM | POA: Diagnosis not present

## 2017-12-27 LAB — BASIC METABOLIC PANEL
ANION GAP: 11 (ref 5–15)
BUN: 65 mg/dL — AB (ref 8–23)
CHLORIDE: 104 mmol/L (ref 98–111)
CO2: 25 mmol/L (ref 22–32)
Calcium: 9.3 mg/dL (ref 8.9–10.3)
Creatinine, Ser: 3.16 mg/dL — ABNORMAL HIGH (ref 0.61–1.24)
GFR calc Af Amer: 19 mL/min — ABNORMAL LOW (ref >60)
GFR calc non Af Amer: 16 mL/min — ABNORMAL LOW (ref >60)
GLUCOSE: 118 mg/dL — AB (ref 70–99)
POTASSIUM: 4.5 mmol/L (ref 3.5–5.1)
SODIUM: 140 mmol/L (ref 135–145)

## 2017-12-29 ENCOUNTER — Other Ambulatory Visit (HOSPITAL_COMMUNITY): Payer: Self-pay | Admitting: Internal Medicine

## 2017-12-30 ENCOUNTER — Ambulatory Visit (INDEPENDENT_AMBULATORY_CARE_PROVIDER_SITE_OTHER): Payer: Medicare Other

## 2017-12-30 DIAGNOSIS — I5022 Chronic systolic (congestive) heart failure: Secondary | ICD-10-CM | POA: Diagnosis not present

## 2017-12-30 DIAGNOSIS — Z950 Presence of cardiac pacemaker: Secondary | ICD-10-CM | POA: Diagnosis not present

## 2017-12-30 NOTE — Progress Notes (Signed)
EPIC Encounter for ICM Monitoring  Patient Name: Marcus [name] is a 84 y.o. male Date: 12/30/2017 Primary Care Physican: Patient, No Pcp Per Primary [place] Electrophysiologist: Lovena Le Dry Weight: 176lb office weight Bi-V Pacing: >99%       Attempted call to patient and unable to reach.   Transmission reviewed.     Thoracic impedance abnormal suggesting fluid accumulation starting 12/27/2017.  Prescribed dosage: Torsemide20 mg1 tablet daily.  Labs: 12/27/2017 Creatinine 3.16, BUN 65, Potassium 4.5, Sodium 140, EGFR 16-19 12/20/2017 Creatinine 3.28, BUN 70, Potassium 5.2, Sodium 140, EGFR 16-18  12/18/2017 Creatinine 3.11, BUN 65, Potassium 5.7, Sodium 138, EGFR 17-19  10/25/2017 Creatinine 3.28, BUN 94, Potassium 4.6, Sodium 139, EGFR 16-18 10/17/2017 Creatinine 2.96, BUN 64, Potassium 4.8, Sodium 137, EGFR 18-21 05/31/2017 Creatinine 2.74, BUN 66, Potassium 4.7, Sodium 138, EGFR 19-23 04/17/2017 Creatinine 2.56, BUN 60, Potassium 4.6, Sodium 136, EGFR 21-25 04/05/2017 Creatinine 2.83, BUN 66, Potassium4.1,Sodium 138, EGFR 19-22 02/08/2017 Creatinine 2.41, BUN 57, Potassium 4.4, Sodium 140, EGFR 23-27 01/09/2017 Creatinine 2.31, BUN 49, Potassium 4.1, Sodium 141, EGFR 24-28 12/05/2016 Creatinine 2.87, BUN 71, Potassium 4.3, Sodium 139, EGFR 19-22 11/22/2016 Creatinine 2.57, BUN 61, Potassium 4.3, Sodium 139, EGFR 21-25  Recommendations: NONE - Unable to reach.  Follow-up plan: ICM clinic phone appointment on 01/07/2018 to recheck fluid levels.   Office appointment scheduled 03/21/2018 with fDr. Aundra Dubin.    Copy of ICM check sent to Dr. Lovena Le and Dr Aundra Dubin for review and recommendation if needed.   3 month ICM trend: 12/30/2017    1 Year ICM trend:      Rosalene Billings, RN 12/30/2017 2:28 PM

## 2017-12-31 ENCOUNTER — Telehealth: Payer: Self-pay

## 2017-12-31 NOTE — Telephone Encounter (Signed)
Remote ICM transmission received.  Attempted call to wife and no answer or voice mail.

## 2018-01-02 NOTE — Progress Notes (Signed)
Take torsemide 40 daily x 2 days then back to 20 daily.

## 2018-01-02 NOTE — Progress Notes (Signed)
Spoke with wife.  Advised latest transmission suggesting increase in fluid accumulation and Dr Aundra Dubin instructed to take Torsemide 40 mg x 2 days and then back to 20 mg daily.  She said she will tell patient but he may not be compliant with the order because when he takes extra Torsemide it causes him a lot of back pain. He has difficulty walking when the back becomes severe.  Advised to encourage him to take as prescribed.  Will repeat transmission on 8/20

## 2018-01-03 ENCOUNTER — Other Ambulatory Visit (HOSPITAL_COMMUNITY): Payer: Self-pay

## 2018-01-03 MED ORDER — CARVEDILOL 6.25 MG PO TABS: ORAL_TABLET | ORAL | 1 refills | Status: DC

## 2018-01-07 ENCOUNTER — Ambulatory Visit (INDEPENDENT_AMBULATORY_CARE_PROVIDER_SITE_OTHER): Payer: Medicare Other

## 2018-01-07 DIAGNOSIS — I5022 Chronic systolic (congestive) heart failure: Secondary | ICD-10-CM

## 2018-01-07 DIAGNOSIS — Z950 Presence of cardiac pacemaker: Secondary | ICD-10-CM

## 2018-01-07 NOTE — Progress Notes (Signed)
EPIC Encounter for ICM Monitoring  Patient Name: Marcus [name] is a 84 y.o. male Date: 01/07/2018 Primary Care Physican: Patient, No Pcp Per Primary [place] Electrophysiologist: Lovena Le Dry Weight: 176lb office weight Bi-V Pacing: >99%              Spoke with wife. Heart Failure questions reviewed, pt asymptomatic.   Thoracic impedance returned close to normal by adjust sodium intake.  Patient declined to take extra Torsemide as prescribed by Dr Aundra Dubin because it causes severe back pain.   Prescribed dosage: Torsemide20 mg1 tablet daily.  Labs: 12/27/2017 Creatinine 3.16, BUN 65, Potassium 4.5, Sodium 140, EGFR 16-19 12/20/2017 Creatinine 3.28, BUN 70, Potassium 5.2, Sodium 140, EGFR 16-18  12/18/2017 Creatinine 3.11, BUN 65, Potassium 5.7, Sodium 138, EGFR 17-19  10/25/2017 Creatinine 3.28, BUN 94, Potassium 4.6, Sodium 139, EGFR 16-18 10/17/2017 Creatinine 2.96, BUN 64, Potassium 4.8, Sodium 137, EGFR 18-21 05/31/2017 Creatinine 2.74, BUN 66, Potassium 4.7, Sodium 138, EGFR 19-23 04/17/2017 Creatinine 2.56, BUN 60, Potassium 4.6, Sodium 136, EGFR 21-25 04/05/2017 Creatinine 2.83, BUN 66, Potassium4.1,Sodium 138, EGFR 19-22 02/08/2017 Creatinine 2.41, BUN 57, Potassium 4.4, Sodium 140, EGFR 23-27 01/09/2017 Creatinine 2.31, BUN 49, Potassium 4.1, Sodium 141, EGFR 24-28 12/05/2016 Creatinine 2.87, BUN 71, Potassium 4.3, Sodium 139, EGFR 19-22 11/22/2016 Creatinine 2.57, BUN 61, Potassium 4.3, Sodium 139, EGFR 21-25  Recommendations: No changes.   Encouraged to call for fluid symptoms.  Follow-up plan: ICM clinic phone appointment on 01/30/2018.   Office appointment scheduled 03/21/2018 with Dr. Aundra Dubin.    Copy of ICM check sent to Dr. Lovena Le.   3 month ICM trend: 01/07/2018     Direct Trend Viewer   1 Year ICM trend:       Rosalene Billings, RN 01/07/2018 7:32 AM

## 2018-01-09 ENCOUNTER — Other Ambulatory Visit: Payer: Self-pay | Admitting: Internal Medicine

## 2018-01-30 ENCOUNTER — Ambulatory Visit (INDEPENDENT_AMBULATORY_CARE_PROVIDER_SITE_OTHER): Payer: Medicare Other | Admitting: *Deleted

## 2018-01-30 ENCOUNTER — Ambulatory Visit (INDEPENDENT_AMBULATORY_CARE_PROVIDER_SITE_OTHER): Payer: Medicare Other

## 2018-01-30 DIAGNOSIS — Z950 Presence of cardiac pacemaker: Secondary | ICD-10-CM

## 2018-01-30 DIAGNOSIS — I495 Sick sinus syndrome: Secondary | ICD-10-CM

## 2018-01-30 DIAGNOSIS — I5022 Chronic systolic (congestive) heart failure: Secondary | ICD-10-CM | POA: Diagnosis not present

## 2018-01-30 NOTE — Progress Notes (Signed)
Remote pacemaker transmission.   

## 2018-01-31 NOTE — Progress Notes (Signed)
EPIC Encounter for ICM Monitoring  Patient Name: Marcus [name] is a 84 y.o. male Date: 01/31/2018 Primary Care Physican: Patient, No Pcp Per Primary Cardiologist:[place] Electrophysiologist: Lovena Le Dry Weight:176lb office weight Bi-V Pacing: >99%   AT/AF Burden: >1%                                                     Attempted call to wife and unable to reach.  Left detailed message, per DPR, regarding transmission.  Transmission reviewed.    Thoracic impedance normal.   Prescribed: Torsemide20 mg1 tabletdaily.  Labs: 12/27/2017 Creatinine3.16, BUN65, Potassium4.5, Sodium140, [id number] 12/20/2017 Creatinine3.28, BUN70, Potassium5.2, Sodium140, [id number]  12/18/2017 Creatinine3.11, BUN65, Potassium5.7, [id number], [id number] 10/25/2017 Creatinine 3.28, BUN 94, Potassium 4.6, Sodium 139, EGFR 16-18 10/17/2017 Creatinine 2.96, BUN 64, Potassium 4.8, Sodium 137, EGFR 18-21 05/31/2017 Creatinine 2.74, BUN 66, Potassium 4.7, Sodium 138, EGFR 19-23 04/17/2017 Creatinine 2.56, BUN 60, Potassium 4.6, Sodium 136, EGFR 21-25 04/05/2017 Creatinine 2.83, BUN 66, Potassium4.1,Sodium 138, EGFR 19-22 02/08/2017 Creatinine 2.41, BUN 57, Potassium 4.4, Sodium 140, EGFR 23-27 01/09/2017 Creatinine 2.31, BUN 49, Potassium 4.1, Sodium 141, EGFR 24-28 12/05/2016 Creatinine 2.87, BUN 71, Potassium 4.3, Sodium 139, EGFR 19-22 11/22/2016 Creatinine 2.57, BUN 61, Potassium 4.3, Sodium 139, EGFR 21-25  Recommendations: Left voice mail with ICM number and encouraged to call if experiencing any fluid symptoms.  Follow-up plan: ICM clinic phone appointment on 03/03/2018.   Office appointment scheduled 03/21/2018 with Dr. Aundra Dubin.    Copy of ICM check sent to Dr. Lovena Le.   3 month ICM trend: 01/30/2018    1 Year ICM trend:       Rosalene Billings, RN 01/31/2018 11:54 AM

## 2018-02-14 DIAGNOSIS — H353113 Nonexudative age-related macular degeneration, right eye, advanced atrophic without subfoveal involvement: Secondary | ICD-10-CM | POA: Diagnosis not present

## 2018-02-19 LAB — CUP PACEART REMOTE DEVICE CHECK
Battery Remaining Longevity: 37 mo
Battery Remaining Percentage: 80 %
Battery Voltage: 2.93 V
Brady Statistic AP VP Percent: 96 %
Brady Statistic AS VS Percent: 1 %
Date Time Interrogation Session: 20190912085944
Implantable Lead Implant Date: 20160304
Implantable Lead Implant Date: 20160304
Implantable Lead Implant Date: 20160304
Implantable Lead Location: 753858
Implantable Lead Location: 753859
Implantable Pulse Generator Implant Date: 20160304
Lead Channel Impedance Value: 440 Ohm
Lead Channel Impedance Value: 850 Ohm
Lead Channel Pacing Threshold Amplitude: 0.875 V
Lead Channel Pacing Threshold Amplitude: 1.125 V
Lead Channel Pacing Threshold Pulse Width: 0.5 ms
Lead Channel Pacing Threshold Pulse Width: 0.6 ms
Lead Channel Sensing Intrinsic Amplitude: 1.1 mV
Lead Channel Setting Pacing Amplitude: 2.125
Lead Channel Setting Pacing Amplitude: 3.125
Lead Channel Setting Pacing Pulse Width: 0.5 ms
Lead Channel Setting Sensing Sensitivity: 5 mV
MDC IDC LEAD LOCATION: 753860
MDC IDC MSMT LEADCHNL RA PACING THRESHOLD AMPLITUDE: 1.625 V
MDC IDC MSMT LEADCHNL RA PACING THRESHOLD PULSEWIDTH: 1 ms
MDC IDC MSMT LEADCHNL RV IMPEDANCE VALUE: 450 Ohm
MDC IDC MSMT LEADCHNL RV SENSING INTR AMPL: 11.3 mV
MDC IDC SET LEADCHNL LV PACING AMPLITUDE: 2 V
MDC IDC SET LEADCHNL LV PACING PULSEWIDTH: 0.6 ms
MDC IDC STAT BRADY AP VS PERCENT: 1 %
MDC IDC STAT BRADY AS VP PERCENT: 4 %
MDC IDC STAT BRADY RA PERCENT PACED: 96 %
Pulse Gen Model: 3242
Pulse Gen Serial Number: 7725037

## 2018-03-03 ENCOUNTER — Ambulatory Visit (INDEPENDENT_AMBULATORY_CARE_PROVIDER_SITE_OTHER): Payer: Medicare Other

## 2018-03-03 DIAGNOSIS — Z950 Presence of cardiac pacemaker: Secondary | ICD-10-CM

## 2018-03-03 DIAGNOSIS — I5022 Chronic systolic (congestive) heart failure: Secondary | ICD-10-CM | POA: Diagnosis not present

## 2018-03-03 NOTE — Progress Notes (Signed)
EPIC Encounter for ICM Monitoring  Patient Name: Marcus [name] is a 84 y.o. male Date: 03/03/2018 Primary Care Physican: Patient, No Pcp Per Primary [place] Electrophysiologist: Lovena Le Dry Weight:176lb  Bi-V Pacing: >99%      Spoke with wife.  Heart Failure questions reviewed, pt asymptomatic.   Thoracic impedance normal.   Prescribed: Torsemide20 mg1 tabletdaily.  Labs: 12/27/2017 Creatinine3.16, BUN65, Potassium4.5, Sodium140, [id number] 12/20/2017 Creatinine3.28, BUN70, Potassium5.2, Sodium140, [id number]  12/18/2017 Creatinine3.11, BUN65, Potassium5.7, [id number], [id number] 10/25/2017 Creatinine 3.28, BUN 94, Potassium 4.6, Sodium 139, EGFR 16-18 10/17/2017 Creatinine 2.96, BUN 64, Potassium 4.8, Sodium 137, EGFR 18-21 05/31/2017 Creatinine 2.74, BUN 66, Potassium 4.7, Sodium 138, EGFR 19-23  Recommendations: No changes.  He has been more restrictive with salt intake.  Encouraged to call for fluid symptoms.  Follow-up plan: ICM clinic phone appointment on 04/03/2018.   Office appointment scheduled 03/21/2018 with Dr. Aundra Dubin.    Copy of ICM check sent to Dr. Lovena Le.   3 month ICM trend: 03/03/2018    1 Year ICM trend:       Marcus Billings, RN 03/03/2018 3:53 PM

## 2018-03-21 ENCOUNTER — Encounter (HOSPITAL_COMMUNITY): Payer: Medicare Other | Admitting: Cardiology

## 2018-03-29 ENCOUNTER — Encounter (HOSPITAL_COMMUNITY): Payer: Self-pay | Admitting: Emergency Medicine

## 2018-03-29 ENCOUNTER — Telehealth: Payer: Self-pay | Admitting: Physician Assistant

## 2018-03-29 ENCOUNTER — Emergency Department (HOSPITAL_COMMUNITY)
Admission: EM | Admit: 2018-03-29 | Discharge: 2018-03-29 | Disposition: A | Discharge status: Home / Self Care | Payer: Medicare Other | Attending: Emergency Medicine | Admitting: Emergency Medicine

## 2018-03-29 ENCOUNTER — Emergency Department (HOSPITAL_COMMUNITY): Payer: Medicare Other

## 2018-03-29 DIAGNOSIS — L03113 Cellulitis of right upper limb: Secondary | ICD-10-CM | POA: Diagnosis not present

## 2018-03-29 DIAGNOSIS — Z87891 Personal history of nicotine dependence: Secondary | ICD-10-CM | POA: Insufficient documentation

## 2018-03-29 DIAGNOSIS — I130 Hypertensive heart and chronic kidney disease with heart failure and stage 1 through stage 4 chronic kidney disease, or unspecified chronic kidney disease: Secondary | ICD-10-CM | POA: Insufficient documentation

## 2018-03-29 DIAGNOSIS — N183 Chronic kidney disease, stage 3 (moderate): Secondary | ICD-10-CM | POA: Diagnosis not present

## 2018-03-29 DIAGNOSIS — I5022 Chronic systolic (congestive) heart failure: Secondary | ICD-10-CM | POA: Insufficient documentation

## 2018-03-29 DIAGNOSIS — Z23 Encounter for immunization: Secondary | ICD-10-CM | POA: Diagnosis not present

## 2018-03-29 DIAGNOSIS — M25521 Pain in right elbow: Secondary | ICD-10-CM | POA: Diagnosis present

## 2018-03-29 DIAGNOSIS — Z79899 Other long term (current) drug therapy: Secondary | ICD-10-CM | POA: Insufficient documentation

## 2018-03-29 DIAGNOSIS — M25421 Effusion, right elbow: Secondary | ICD-10-CM | POA: Diagnosis not present

## 2018-03-29 MED ORDER — ACETAMINOPHEN 325 MG PO TABS
650.0000 mg | ORAL_TABLET | Freq: Once | ORAL | Status: AC
  Administered 2018-03-29: 650 mg via ORAL
  Filled 2018-03-29: qty 2

## 2018-03-29 MED ORDER — DOXYCYCLINE HYCLATE 100 MG PO TABS
100.0000 mg | ORAL_TABLET | Freq: Once | ORAL | Status: AC
  Administered 2018-03-29: 100 mg via ORAL
  Filled 2018-03-29: qty 1

## 2018-03-29 MED ORDER — DOXYCYCLINE HYCLATE 100 MG PO CAPS: 100.0000 mg | ORAL_CAPSULE | Freq: Two times a day (BID) | ORAL | 0 refills | Status: AC

## 2018-03-29 MED ORDER — TETANUS-DIPHTH-ACELL PERTUSSIS 5-2.5-18.5 LF-MCG/0.5 IM SUSP
0.5000 mL | Freq: Once | INTRAMUSCULAR | Status: AC
Start: 2018-03-29 — End: 2018-03-29
  Administered 2018-03-29: 0.5 mL via INTRAMUSCULAR
  Filled 2018-03-29: qty 0.5

## 2018-03-29 NOTE — ED Notes (Signed)
Pt in xray

## 2018-03-29 NOTE — Telephone Encounter (Signed)
84 y.o. male with a hx of atrial fibrillation, ICM, systolic congestive heart failure, s/p CRT-P, CKD.  His wife called the answering service today to see if he can take Ibuprofen.  His elbow is extremely tender and "hot" to the touch.  She does not feel that it is red and he does not have any fever.   PLAN:  1. I have advised his wife Fraser Din) that he should be seen today for evaluation to rule out septic arthritis (urgent care or ED). 2. I have advised her to not give him Ibuprofen given his CHF, CKD. 3. She may give him Tylenol for pain relief. Richardson Dopp, PA-C    03/29/2018 11:33 AM

## 2018-03-29 NOTE — Discharge Instructions (Addendum)
Please see the information and instructions below regarding your visit.  Your diagnoses today include:  1. Cellulitis of right elbow     Tests performed today include: See side panel of your discharge paperwork for testing performed today. Vital signs are listed at the bottom of these instructions.   Ultrasound shows no fluid in the joint.  X-ray shows no erosions of the bone.  Medications prescribed:    Take any prescribed medications only as prescribed, and any over the counter medications only as directed on the packaging.  Doxycycline is an antibiotic that fights infection in the skin. This medication can make your skin sensitive to the sun, so please ensure that you wear sunscreen, hats, or other coverage over your skin while taking this. Things to know about this medication:   1) Please take this medication 2 hours before or 4 hours after your multivitamin. 2) Do not take with milk.   You may continue Tylenol.  Please do not exceed 4000 mg in 1 day.  Home care instructions:  Please follow any educational materials contained in this packet.   Please keep the area of redness warm and dry.  Please elevate the arm above the level of your heart whenever possible.  Follow-up instructions: Please follow-up with your primary care provider early next week for further evaluation of your symptoms if they are not completely improved.   In the first 24 hours, there may be slight increase in redness due to the antibiotic working on the infection.  Beyond that, the redness should start to go down.  If the elbow substantially improved by Tuesday, you may not need to be reassessed, however if there is redness left, I would like this rechecked.  You may get this rechecked to urgent care across the street, which I listed the address for any paperwork.  Alternatively, you can have this rechecked here.  Return instructions:  Please return to the Emergency Department if you experience worsening  symptoms.  Please return the emergency department immediately for any increase in the redness or swelling, decreased range of motion of your elbow, or fevers. Please return if you have any other emergent concerns.  Additional Information:   Your vital signs today were: BP 114/72 (BP Location: Left Arm)    Pulse 73    Temp 97.6 F (36.4 C) (Oral)    Resp 16    SpO2 99%  If your blood pressure (BP) was elevated on multiple readings during this visit above 130 for the top number or above 80 for the bottom number, please have this repeated by your primary care provider within one month. --------------  Thank you for allowing [place] to participate in your care today.

## 2018-03-29 NOTE — ED Provider Notes (Signed)
[facility] EMERGENCY DEPARTMENT Provider Note   CSN: [id number] Arrival date & time: 03/29/18  1305     History   Chief Complaint Chief Complaint  Patient presents with  . Elbow Pain    HPI Marcus [name] is a 84 y.o. male.  HPI   Patient is an 74-year male with a history of atrial flutter (on Eliquis), chronic systolic CHF, CKD stage III, CAD, hypertension presenting for right elbow pain.  He reports that the pain began approximately 3 days ago, and is increased in erythema and pain of the right lateral elbow.  Reports it is hot to touch.  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  Denies any fever or chills.  Denies any numbness, paresthesias, or weakness distal to the erythema.  No recent trauma.  Past Medical History:  Diagnosis Date  . Atrial flutter ([place])    a. Dx 07/2014, s/p TEE/DCCV.  . Cataracts, bilateral   . Chronic systolic CHF (congestive heart failure) ([place])    a. s/p CRT-D 09/2013. b. Device explant with temp perm then subsequent CRT-P 07/2014.  Marland Kitchen CKD (chronic kidney disease), stage III ([place])   . Coronary artery disease    a. s/p remote CABG x 2(VG->OM, LIMA->LAD;  b. Late 90's s/p PCI of RCA;  c. 12/09 Cath/PCI: LM 19m, 70-80d (3.0x60mm Xience DES), LAD100p, LCX 80-90p (2.25x31mmTaxus Atom DES), RCA 100d (2.5x57mm Xience DES), VG->OM 100, LIMA->LAD nl, EF 30-35%.  Marland Kitchen DVT (deep venous thrombosis) ([place]) 2017   Right knee  . Hard of hearing   . History of colon polyps   . History of gastric ulcer   . History of gout   . History of shingles   . Hyperlipidemia   . Hypertension   . Ischemic cardiomyopathy    a. EF 35-40% by echo 05/2012, b. EF 20-25%, akinesis and scarring of inferolateral, inferior and inferoseptal myocardium, mild AI, mod MR, LA mod dilated, RV mildly dialted and sys fx mildly reduced, RA mildly dilated (09/2013)  c. EF 35-40% by 2D in 07/2014, 25-30% by  TEE.  . Macular degeneration, dry   . Pacemaker infection ([place])   . PAF (paroxysmal atrial fibrillation) ([place])   . Peripheral vascular disease ([place])   . Presence of permanent cardiac pacemaker 07/23/2014   [facility] [id number], Serial [id number]  . Prostate cancer ([place])   . Symptomatic bradycardia    a. 08/2003 s/p MDT Enpulse [id number] Dual chamber PPM ser # [id number]. b. Device explant due to infection/temp perm insertion/PPM 07/2014.    Patient Active Problem List   Diagnosis Date Noted  . Frequent PVCs 02/23/2015  . Chronic systolic CHF (congestive heart failure) ([place])   . Pacemaker infection ([place]) 07/19/2014  . CKD (chronic kidney disease) stage 3, GFR 30-59 ml/min (HCC) 11/25/2013  . Pacemaker pocket hematoma 10/22/2013  . Acute on chronic systolic heart failure ([place]) 10/06/2013  . H/O noncompliance with medical treatment, presenting hazards to health 08/31/2013  . Peripheral vascular disease, unspecified ([place]) 08/19/2013  . Arterial embolism of right leg ([place]) 07/30/2013  . Ischemic leg 07/29/2013  . Chronic systolic heart failure ([place]) 11/20/2012  . Paroxysmal atrial flutter ([place]) 06/29/2012  . Acute on chronic systolic CHF (congestive heart failure) ([place]) 06/28/2012  . Ischemic cardiomyopathy 06/28/2012  . Hyperlipidemia 06/28/2012  . Normocytic anemia 06/28/2012  . Anemia 06/25/2012  . Pacemaker-Medtronic 02/01/2012  . HTN (hypertension) 02/28/2011  .  Essential hypertension, benign 06/16/2010  . CAD 06/16/2010  . Atrial fibrillation ([place]) 06/16/2010  . SICK SINUS SYNDROME 06/16/2010    Past Surgical History:  Procedure Laterality Date  . ANGIOPLASTY     stent placement  . APPENDECTOMY    . BI-VENTRICULAR PACEMAKER INSERTION [name] 07/23/2014   Procedure: BI-VENTRICULAR PACEMAKER INSERTION (CRT-P);  Surgeon: Deboraha Sprang, MD;  Location[facility] C[facility] LAB;  Service: Cardiovascular;  Laterality: [name];  . BI-VENTRICULAR PACEMAKER UPGRADE [name] 10/08/2013   Procedure:  BI-VENTRICULAR PACEMAKER UPGRADE;  Surgeon: [name], MD;  Location: [facility] CATH LAB;  Service: Cardiovascular;  Laterality: [name];  . CARDIAC CATHETERIZATION  most recent in 2009   several  . CARDIAC CATHETERIZATION Right 07/19/2014   Procedure: TEMPORARY PACEMAKER;  Surgeon: [name], MD;  Location: [facility];  Service: Cardiovascular;  Laterality: Right;  . CARDIAC CATHETERIZATION [name] 03/01/2016   Procedure: Right Heart Cath;  Surgeon: [name], MD;  Location: [facility] CV LAB;  Service: Cardiovascular;  Laterality: [name];  . CARDIOVERSION  06/26/2012   Procedure: CARDIOVERSION;  Surgeon: Lelon Perla, MD;  Location: [facility] ENDOSCOPY;  Service: Cardiovascular;  Laterality: [name];  . CARDIOVERSION [name] 07/27/2014   Procedure: CARDIOVERSION;  Surgeon: [name], MD;  Location: [place];  Service: Cardiovascular;  Laterality: [name];  . CATARACT EXTRACTION W/PHACO Left 09/24/2016   Procedure: CATARACT EXTRACTION PHACO AND INTRAOCULAR LENS PLACEMENT ([place])  Left;  Surgeon: Leandrew Koyanagi, MD;  Location: [place] [facility];  Service: Ophthalmology;  Laterality: Left;  . CATARACT EXTRACTION W/PHACO Right 10/17/2016   Procedure: CATARACT EXTRACTION PHACO AND INTRAOCULAR LENS PLACEMENT ([place])  Right;  Surgeon: Leandrew Koyanagi, MD;  Location: [place] [facility];  Service: Ophthalmology;  Laterality: Right;  prefers mid to late morning arrival  . COLONOSCOPY    . CORONARY ANGIOPLASTY     couple   . CORONARY ARTERY BYPASS GRAFT  1990   left main  . DOPPLER ECHOCARDIOGRAPHY  06/24/2002   EF 60-65%  . EMBOLECTOMY Right 07/28/2013   Procedure: Right Popliteal and Tibial Embolectomy;  Surgeon: Angelia Mould, MD;  Location: [facility];  Service: Vascular;  Laterality: Right;  . INSERT / REPLACE / REMOVE PACEMAKER     1st pacemaker placed 10+yrs ago  . PACEMAKER LEAD REMOVAL Right 07/19/2014   Procedure: PACEMAKER LEAD REMOVAL/EXTRACTION;  Surgeon: [name], MD;  Location: [facility];   Service: Cardiovascular;  Laterality: Right;  DR. Lucianne Lei TRIGT TO BACK UP CASE  . PERMANENT PACEMAKER GENERATOR CHANGE [name] 08/27/2012   Procedure: PERMANENT PACEMAKER GENERATOR CHANGE;  Surgeon: [name], MD;  Location[facility] CATH LAB;  Service: Cardiovascular;  Laterality: [name];  . TEE WITHOUT CARDIOVERSION  06/26/2012   Procedure: TRANSESOPHAGEAL ECHOCARDIOGRAM (TEE);  Surgeon: Lelon Perla, MD;  Location: [facility] ENDOSCOPY;  Service: Cardiovascular;  Laterality: [name];  . TEE WITHOUT CARDIOVERSION [name] 07/27/2014   Procedure: TRANSESOPHAGEAL ECHOCARDIOGRAM (TEE);  Surgeon: [name], MD;  Location: [facility];  Service: Cardiovascular;  Laterality: [name];        Home Medications    Prior to Admission medications   Medication Sig Start Date End Date Taking? Authorizing Provider  amiodarone (PACERONE) 200 MG tablet TAKE 1/2 TABLET (100MG      TOTAL) DAILY 09/12/17   [name], MD  apixaban (ELIQUIS) 2.5 MG TABS tablet Take 1 tablet (2.5 mg total) by mouth 2 (two) times daily. 12/03/17   [name], MD  carvedilol (COREG) 6.25 MG tablet TAKE 1 TABLET  BY MOUTH 2 TIMES A DAY WITH MEALS 01/03/18   [name], MD  Coenzyme Q10 (CO Q 10) 100 MG CAPS Take 1 capsule by mouth 2 (two) times daily.    [provider]  Multiple Vitamins-Minerals (MULTIVITAMINS/MINERALS ADULT PO) Take by mouth daily. MacuGuard    [provider]  Multiple Vitamins-Minerals (PRESERVISION AREDS 2 PO) Take 1 capsule by mouth 2 (two) times daily.    [provider]  torsemide (DEMADEX) 20 MG tablet Take 1 tablet (20 mg total) by mouth daily. 10/17/17   [name], MD    Family History Family History  Problem Relation Age of Onset  . Hypertension Mother   . Colon cancer Mother   . Cancer Mother        Colon  . Alcohol abuse Father     Social History Social History   Tobacco Use  . Smoking status: Former Smoker    Last attempt to quit: 02/10/1971    Years since  quitting: 47.1  . Smokeless tobacco: Never Used  . Tobacco comment: quit smoking 78yrs ago  Substance Use Topics  . Alcohol use: No    Comment: nothing in 19yrs   . Drug use: No     Allergies   Patient has no known allergies.   Review of Systems Review of Systems  Constitutional: Negative for chills and fever.  Musculoskeletal: Positive for arthralgias and myalgias. Negative for joint swelling.  Skin: Positive for color change.  Neurological: Negative for weakness and numbness.     Physical Exam Updated Vital Signs BP 114/72 (BP Location: Left Arm)   Pulse 73   Temp 97.6 F (36.4 C) (Oral)   Resp 16   SpO2 99%   Physical Exam  Constitutional: He appears well-developed and well-nourished. No distress.  Sitting comfortably in bed.  HENT:  Head: Normocephalic and atraumatic.  Eyes: Conjunctivae are normal. Right eye exhibits no discharge. Left eye exhibits no discharge.  EOMs normal to gross examination.  Neck: Normal range of motion.  Cardiovascular: Normal rate and regular rhythm.  Intact, 2+ radial pulse of RUE.   Pulmonary/Chest:  Normal respiratory effort. Patient converses comfortably. No audible wheeze or stridor.  Abdominal: He exhibits no distension.  Musculoskeletal:  Patient has 3cm area at greatest diameter of circumscribed erythema to the right lateral elbow.  Not involving the olecranon.  Patient is able to flex, extend, pronate, and supinate at the right elbow with pain, but is not restricted.  No fusiform swelling of the elbow.  No swelling of the right forearm.  No obvious effusion. Full sensation distally.  Neurological: He is alert.  Cranial nerves intact to gross observation. Patient moves extremities without difficulty.  Skin: Skin is warm and dry. He is not diaphoretic. There is erythema.  +erythema of right elbow.  Psychiatric: He has a normal mood and affect. His behavior is normal. Judgment and thought content normal.  Nursing note and vitals  reviewed.    ED Treatments / Results  Labs (all labs ordered are listed, but only abnormal results are displayed) Labs Reviewed - No data to display  EKG None  Radiology No results found.  Procedures Procedures (including critical care time)  Medications Ordered in ED Medications  Tdap (BOOSTRIX) injection 0.5 mL (has no administration in time range)     Initial Impression / Assessment and Plan / ED Course  I have reviewed the triage vital signs and the nursing notes.  Pertinent labs & imaging results  that were available during my care of the patient were reviewed by me and considered in my medical decision making (see chart for details).     Patient is nontoxic-appearing, afebrile, he dynamically stable, and in no acute distress.  Do not suspect septic arthritis given no fusiform swelling, obvious effusion of the right elbow, and patient able to perform passive and active range of motion.  Do not suspect septic bursitis.  Appears most consistent with cellulitis overlying the lateral aspect of the elbow.  Patient was in dependently evaluated, and ultrasound was performed in conjunction with Dr. Gerald Stabs Tegeler.  Discussed with the patient that with active signs of septic arthritis, aspiration of the elbow could be dangerous and seed the elbow with bacteria.   Spoke with pharmacist Kenney Houseman to do medication reconciliation and ensure there were no interactions with medications with antibiotic.  Additionally, patient has CKD stage III, and ensure that patient would not have any interactions that adversely affect renal function.  Doxycycline selected. Side effects of this medication discussed with patient.   Patient is currently followed by cardiology, does not have regular PCP follow-up.  I did discuss with the patient that if the erythema is spreading outside the area marked in the next 48 hours, or if there is still erythema persisting in 2 days, we would like him rechecked in urgent  care and referred to the emergency department as appropriate.  Final Clinical Impressions(s) / ED Diagnoses   Final diagnoses:  Cellulitis of right elbow    ED Discharge Orders         Ordered    doxycycline (VIBRAMYCIN) 100 MG capsule  2 times daily     03/29/18 1550           Albesa Seen, [place] 03/29/18 1602    Tegeler, Gwenyth Allegra, MD 03/29/18 1650

## 2018-03-29 NOTE — ED Notes (Signed)
Patient verbalizes understanding of discharge instructions. Opportunity for questioning and answers were provided. Armband removed by staff, pt discharged from ED.  

## 2018-03-29 NOTE — ED Notes (Signed)
Skin marker used to outline red area on right elbow

## 2018-03-29 NOTE — ED Triage Notes (Signed)
Pt here for right elbow pain. Right elbow is swollen, red, and warm to tough. Pt has had pain since Wednesday. Pt is able to bend elbow.

## 2018-04-03 ENCOUNTER — Ambulatory Visit (INDEPENDENT_AMBULATORY_CARE_PROVIDER_SITE_OTHER): Payer: Medicare Other

## 2018-04-03 DIAGNOSIS — I5022 Chronic systolic (congestive) heart failure: Secondary | ICD-10-CM

## 2018-04-03 DIAGNOSIS — Z950 Presence of cardiac pacemaker: Secondary | ICD-10-CM | POA: Diagnosis not present

## 2018-04-03 NOTE — Progress Notes (Signed)
EPIC Encounter for ICM Monitoring  Patient Name: Marcus [name] is a 84 y.o. male Date: 04/03/2018 Primary Care Physican: Patient, No Pcp Per Primary Cardiologist:[place] Electrophysiologist: Taylor Bi-V Pacing: >99%                                          Last Weight: 176lb Today's Weight: 175 lbs       Spoke with wife. Heart Failure questions reviewed, pt asymptomatic for fluid symptoms.   He was seen in ER last week and dx with cellulitis in right elbow and treated with antibiotics.   The arm has not improved and he is not feel well.  She stated she may take him back to ER today since he declines to have a PCP.   Thoracic impedance normal but was abnormal suggesting fluid accumulation from 03/18/2018 - 03/30/2018.   Prescribed: Torsemide20 mg1 tabletdaily.  Labs: 12/27/2017 Creatinine3.16, BUN65, Potassium4.5, Sodium140, EGFR16-19 12/20/2017 Creatinine3.28, BUN70, Potassium5.2, Sodium140, EGFR16-18  12/18/2017 Creatinine3.11, BUN65, Potassium5.7, Sodium138, EGFR17-19 10/25/2017 Creatinine 3.28, BUN 94, Potassium 4.6, Sodium 139, EGFR 16-18 10/17/2017 Creatinine 2.96, BUN 64, Potassium 4.8, Sodium 137, EGFR 18-21 05/31/2017 Creatinine 2.74, BUN 66, Potassium 4.7, Sodium 138, EGFR 19-23  Recommendations: No changes.   He may go to ER today to follow up on arm cellulitis.  Encouraged to call for fluid symptoms.  Follow-up plan: ICM clinic phone appointment on 05/05/2018.  Office visit with Dr McLean 05/25/2018.    Copy of ICM check sent to Dr. Taylor.   3 month ICM trend: 04/03/2018    1 Year ICM trend:        S , RN 04/03/2018 11:01 AM   

## 2018-04-05 ENCOUNTER — Emergency Department (HOSPITAL_COMMUNITY)
Admission: EM | Admit: 2018-04-05 | Discharge: 2018-04-05 | Disposition: A | Discharge status: Home / Self Care | Payer: Medicare Other | Attending: Emergency Medicine | Admitting: Emergency Medicine

## 2018-04-05 ENCOUNTER — Encounter (HOSPITAL_COMMUNITY): Payer: Self-pay | Admitting: Emergency Medicine

## 2018-04-05 DIAGNOSIS — Z8546 Personal history of malignant neoplasm of prostate: Secondary | ICD-10-CM | POA: Insufficient documentation

## 2018-04-05 DIAGNOSIS — M7021 Olecranon bursitis, right elbow: Secondary | ICD-10-CM | POA: Diagnosis not present

## 2018-04-05 DIAGNOSIS — N183 Chronic kidney disease, stage 3 (moderate): Secondary | ICD-10-CM | POA: Insufficient documentation

## 2018-04-05 DIAGNOSIS — Z79899 Other long term (current) drug therapy: Secondary | ICD-10-CM | POA: Insufficient documentation

## 2018-04-05 DIAGNOSIS — Z87891 Personal history of nicotine dependence: Secondary | ICD-10-CM | POA: Insufficient documentation

## 2018-04-05 DIAGNOSIS — Y939 Activity, unspecified: Secondary | ICD-10-CM | POA: Diagnosis not present

## 2018-04-05 DIAGNOSIS — I130 Hypertensive heart and chronic kidney disease with heart failure and stage 1 through stage 4 chronic kidney disease, or unspecified chronic kidney disease: Secondary | ICD-10-CM | POA: Insufficient documentation

## 2018-04-05 DIAGNOSIS — Z7901 Long term (current) use of anticoagulants: Secondary | ICD-10-CM | POA: Insufficient documentation

## 2018-04-05 DIAGNOSIS — I5022 Chronic systolic (congestive) heart failure: Secondary | ICD-10-CM | POA: Diagnosis not present

## 2018-04-05 DIAGNOSIS — M25521 Pain in right elbow: Secondary | ICD-10-CM | POA: Diagnosis present

## 2018-04-05 MED ORDER — DOXYCYCLINE HYCLATE 100 MG PO CAPS: 100.0000 mg | ORAL_CAPSULE | Freq: Two times a day (BID) | ORAL | 0 refills | Status: DC

## 2018-04-05 MED ORDER — PREDNISONE 10 MG PO TABS: 40.0000 mg | ORAL_TABLET | Freq: Every day | ORAL | 0 refills | Status: DC

## 2018-04-05 NOTE — ED Triage Notes (Addendum)
Pt reports he was seen last week and given abx for red, swollen R elbow. States that he finished abx today but pain is still present. Pt has margin drawn around area, redness is contained within margin, mildly erythematous. Pt denies fevers, chills, n/v. resp e/u, nad.

## 2018-04-05 NOTE — ED Provider Notes (Signed)
[facility] EMERGENCY DEPARTMENT Provider Note   CSN: [id number] Arrival date & time: 04/05/18  1212     History   Chief Complaint Chief Complaint  Patient presents with  . Cellulitis    HPI Marcus [name] is a 84 y.o. male.  The history is provided by the patient and the spouse. No language interpreter was used.   Marcus [name] is a 84 y.o. male who presents to the Emergency Department complaining of cellulitis. Resents to the emergency department complaining of right elbow pain. A week and a half ago he developed pain, swelling and redness to the right elbow. He was evaluated in the emergency department one week ago and diagnosed with cellulitis. He was treated with doxycycline. He has been compliant with his medications. He denies any fevers, chills, chest pain, shortness of breath, nausea, vomiting. He is on his last day of doxycycline and states that he still has pain in his elbow with range of motion. He is right hand dominant. He states the redness is significantly improved. He has a history of CKD and takes eliquis. Symptoms are mild to moderate, constant, improving. Past Medical History:  Diagnosis Date  . Atrial flutter ([place])    a. Dx 07/2014, s/p TEE/DCCV.  . Cataracts, bilateral   . Chronic systolic CHF (congestive heart failure) ([place])    a. s/p CRT-D 09/2013. b. Device explant with temp perm then subsequent CRT-P 07/2014.  Marland Kitchen CKD (chronic kidney disease), stage III ([place])   . Coronary artery disease    a. s/p remote CABG x 2(VG->OM, LIMA->LAD;  b. Late 90's s/p PCI of RCA;  c. 12/09 Cath/PCI: LM 78m, 70-80d (3.0x69mm Xience DES), LAD100p, LCX 80-90p (2.25x15mmTaxus Atom DES), RCA 100d (2.5x55mm Xience DES), VG->OM 100, LIMA->LAD nl, EF 30-35%.  Marland Kitchen DVT (deep venous thrombosis) ([place]) 2017   Right knee  . Hard of hearing   . History of colon polyps   . History of gastric ulcer   . History of gout   . History of shingles   . Hyperlipidemia   .  Hypertension   . Ischemic cardiomyopathy    a. EF 35-40% by echo 05/2012, b. EF 20-25%, akinesis and scarring of inferolateral, inferior and inferoseptal myocardium, mild AI, mod MR, LA mod dilated, RV mildly dialted and sys fx mildly reduced, RA mildly dilated (09/2013)  c. EF 35-40% by 2D in 07/2014, 25-30% by TEE.  . Macular degeneration, dry   . Pacemaker infection ([place])   . PAF (paroxysmal atrial fibrillation) ([place])   . Peripheral vascular disease ([place])   . Presence of permanent cardiac pacemaker 07/23/2014   [facility] [id number], Serial [id number]  . Prostate cancer ([place])   . Symptomatic bradycardia    a. 08/2003 s/p MDT Enpulse [id number] Dual chamber PPM ser # [id number] H. b. Device explant due to infection/temp perm insertion/PPM 07/2014.    Patient Active Problem List   Diagnosis Date Noted  . Frequent PVCs 02/23/2015  . Chronic systolic CHF (congestive heart failure) ([place])   . Pacemaker infection ([place]) 07/19/2014  . CKD (chronic kidney disease) stage 3, GFR 30-59 ml/min (HCC) 11/25/2013  . Pacemaker pocket hematoma 10/22/2013  . Acute on chronic systolic heart failure ([place]) 10/06/2013  . H/O noncompliance with medical treatment, presenting hazards to health 08/31/2013  . Peripheral vascular disease, unspecified ([place]) 08/19/2013  . Arterial embolism of right leg ([place]) 07/30/2013  . Ischemic leg 07/29/2013  . Chronic systolic heart failure ([place]) 11/20/2012  .  Paroxysmal atrial flutter ([place]) 06/29/2012  . Acute on chronic systolic CHF (congestive heart failure) ([place]) 06/28/2012  . Ischemic cardiomyopathy 06/28/2012  . Hyperlipidemia 06/28/2012  . Normocytic anemia 06/28/2012  . Anemia 06/25/2012  . Pacemaker-Medtronic 02/01/2012  . HTN (hypertension) 02/28/2011  . Essential hypertension, benign 06/16/2010  . CAD 06/16/2010  . Atrial fibrillation ([place]) 06/16/2010  . SICK SINUS SYNDROME 06/16/2010    Past Surgical History:  Procedure Laterality Date  . ANGIOPLASTY      stent placement  . APPENDECTOMY    . BI-VENTRICULAR PACEMAKER INSERTION [name] 07/23/2014   Procedure: BI-VENTRICULAR PACEMAKER INSERTION (CRT-P);  Surgeon: Deboraha Sprang, MD;  Location: [facility] CATH LAB;  Service: Cardiovascular;  Laterality: [name];  . BI-VENTRICULAR PACEMAKER UPGRADE [name] 10/08/2013   Procedure: BI-VENTRICULAR PACEMAKER UPGRADE;  Surgeon: [name], MD;  Location[facility] CATH LAB;  Service: Cardiovascular;  Laterality: [name];  . CARDIAC CATHETERIZATION  most recent in 2009   several  . CARDIAC CATHETERIZATION Right 07/19/2014   Procedure: TEMPORARY PACEMAKER;  Surgeon: [name], MD;  Location: [place];  Service: Cardiovascular;  Laterality: Right;  . CARDIAC CATHETERIZATION [name] 03/01/2016   Procedure: Right Heart Cath;  Surgeon: [name], MD;  Location: [place] CV LAB;  Service: Cardiovascular;  Laterality: [name];  . CARDIOVERSION  06/26/2012   Procedure: CARDIOVERSION;  Surgeon: Lelon Perla, MD;  Location: [facility] ENDOSCOPY;  Service: Cardiovascular;  Laterality: [name];  . CARDIOVERSION [name] 07/27/2014   Procedure: CARDIOVERSION;  Surgeon: [name], MD;  Location: [place];  Service: Cardiovascular;  Laterality: [name];  . CATARACT EXTRACTION W/PHACO Left 09/24/2016   Procedure: CATARACT EXTRACTION PHACO AND INTRAOCULAR LENS PLACEMENT ([place])  Left;  Surgeon: Leandrew Koyanagi, MD;  Location: [place];  Service: Ophthalmology;  Laterality: Left;  . CATARACT EXTRACTION W/PHACO Right 10/17/2016   Procedure: CATARACT EXTRACTION PHACO AND INTRAOCULAR LENS PLACEMENT ([place])  Right;  Surgeon: Leandrew Koyanagi, MD;  Location: [place];  Service: Ophthalmology;  Laterality: Right;  prefers mid to late morning arrival  . COLONOSCOPY    . CORONARY ANGIOPLASTY     couple   . CORONARY ARTERY BYPASS GRAFT  1990   left main  . DOPPLER ECHOCARDIOGRAPHY  06/24/2002   EF 60-65%  . EMBOLECTOMY Right 07/28/2013   Procedure: Right Popliteal and Tibial Embolectomy;   Surgeon: Angelia Mould, MD;  Location: [place];  Service: Vascular;  Laterality: Right;  . INSERT / REPLACE / REMOVE PACEMAKER     1st pacemaker placed 10+yrs ago  . PACEMAKER LEAD REMOVAL Right 07/19/2014   Procedure: PACEMAKER LEAD REMOVAL/EXTRACTION;  Surgeon: [name], MD;  Location: [place];  Service: Cardiovascular;  Laterality: Right;  DR. Lucianne Lei TRIGT TO BACK UP CASE  . PERMANENT PACEMAKER GENERATOR CHANGE [name] 08/27/2012   Procedure: PERMANENT PACEMAKER GENERATOR CHANGE;  Surgeon: [name], MD;  Location: [facility] CATH LAB;  Service: Cardiovascular;  Laterality: [name];  . TEE WITHOUT CARDIOVERSION  06/26/2012   Procedure: TRANSESOPHAGEAL ECHOCARDIOGRAM (TEE);  Surgeon: Lelon Perla, MD;  Location: [facility] ENDOSCOPY;  Service: Cardiovascular;  Laterality: [name];  . TEE WITHOUT CARDIOVERSION [name] 07/27/2014   Procedure: TRANSESOPHAGEAL ECHOCARDIOGRAM (TEE);  Surgeon: [name], MD;  Location: [place];  Service: Cardiovascular;  Laterality: [name];        Home Medications    Prior to Admission medications   Medication Sig Start Date End Date Taking? Authorizing Provider  amiodarone (PACERONE) 200 MG tablet TAKE 1/2 TABLET (100MG   TOTAL) DAILY 09/12/17   [name], MD  apixaban (ELIQUIS) 2.5 MG TABS tablet Take 1 tablet (2.5 mg total) by mouth 2 (two) times daily. 12/03/17   [name], MD  carvedilol (COREG) 6.25 MG tablet TAKE 1 TABLET BY MOUTH 2 TIMES A DAY WITH MEALS 01/03/18   [name], MD  Coenzyme Q10 (CO Q 10) 100 MG CAPS Take 1 capsule by mouth 2 (two) times daily.    [provider]  doxycycline (VIBRAMYCIN) 100 MG capsule Take 1 capsule (100 mg total) by mouth 2 (two) times daily for 7 days. 03/29/18 04/05/18  Langston Masker B, PA-C  doxycycline (VIBRAMYCIN) 100 MG capsule Take 1 capsule (100 mg total) by mouth 2 (two) times daily. 04/05/18   Quintella Reichert, MD  Multiple Vitamins-Minerals (MULTIVITAMINS/MINERALS ADULT PO) Take by mouth daily.  MacuGuard    [provider]  Multiple Vitamins-Minerals (PRESERVISION AREDS 2 PO) Take 1 capsule by mouth 2 (two) times daily.    [provider]  predniSONE (DELTASONE) 10 MG tablet Take 4 tablets (40 mg total) by mouth daily. 04/05/18   Quintella Reichert, MD  torsemide (DEMADEX) 20 MG tablet Take 1 tablet (20 mg total) by mouth daily. 10/17/17   [name], MD    Family History Family History  Problem Relation Age of Onset  . Hypertension Mother   . Colon cancer Mother   . Cancer Mother        Colon  . Alcohol abuse Father     Social History Social History   Tobacco Use  . Smoking status: Former Smoker    Last attempt to quit: 02/10/1971    Years since quitting: 47.1  . Smokeless tobacco: Never Used  . Tobacco comment: quit smoking 47yrs ago  Substance Use Topics  . Alcohol use: No    Comment: nothing in 46yrs   . Drug use: No     Allergies   Patient has no known allergies.   Review of Systems Review of Systems  All other systems reviewed and are negative.    Physical Exam Updated Vital Signs BP 102/83   Pulse 89   Resp 15   SpO2 100%   Physical Exam  Constitutional: He is oriented to person, place, and time. He appears well-developed and well-nourished.  HENT:  Head: Normocephalic and atraumatic.  Cardiovascular: Normal rate and regular rhythm.  No murmur heard. Pulmonary/Chest: Effort normal and breath sounds normal. No respiratory distress.  Abdominal: Soft. There is no tenderness. There is no rebound and no guarding.  Musculoskeletal:  2+ radial pulses bilaterally. There is tenderness to palpation over the right of olecranon bursa. There is full range of motion in the right elbow but there is pain with flexion. No difficulty with supination/pronation. There is no overlying erythema or edema to the elbow.  Neurological: He is alert and oriented to person, place, and time.  Five out of five grip strength and bilateral upper  extremities with sensation to light touch intact and bilateral upper extremities.  Skin: Skin is warm and dry.  Psychiatric: He has a normal mood and affect. His behavior is normal.  Nursing note and vitals reviewed.    ED Treatments / Results  Labs (all labs ordered are listed, but only abnormal results are displayed) Labs Reviewed - No data to display  EKG None  Radiology No results found.  Procedures Procedures (including critical care time)  Medications Ordered in ED Medications - No data to display  Initial Impression / Assessment and Plan / ED Course  I have reviewed the triage vital signs and the nursing notes.  Pertinent labs & imaging results that were available during my care of the patient were reviewed by me and considered in my medical decision making (see chart for details).     Patient here for evaluation of right elbow pain, recheck. He is non-toxic appearing on examination. There is no current evidence of infection. Exam is consistent with bursitis that appears to be improving. Given his renal insufficiency as well as anticoagulant use he is not a candidate for NSAID therapy. Will treat with a short course of prednisone. Given the relative immunosuppression with prednisone will continue for a few more days of his antibiotics for cellulitis. Presentation is not consistent with septic arthritis or gouty arthritis. Presentation is not consistent with ACS.  Final Clinical Impressions(s) / ED Diagnoses   Final diagnoses:  Olecranon bursitis of right elbow    ED Discharge Orders         Ordered    predniSONE (DELTASONE) 10 MG tablet  Daily     04/05/18 1230    doxycycline (VIBRAMYCIN) 100 MG capsule  2 times daily     04/05/18 1230           Quintella Reichert, MD 04/05/18 1234

## 2018-05-05 ENCOUNTER — Ambulatory Visit (INDEPENDENT_AMBULATORY_CARE_PROVIDER_SITE_OTHER): Payer: Medicare Other

## 2018-05-05 DIAGNOSIS — I5022 Chronic systolic (congestive) heart failure: Secondary | ICD-10-CM

## 2018-05-05 DIAGNOSIS — I495 Sick sinus syndrome: Secondary | ICD-10-CM

## 2018-05-05 DIAGNOSIS — Z950 Presence of cardiac pacemaker: Secondary | ICD-10-CM | POA: Diagnosis not present

## 2018-05-05 NOTE — Progress Notes (Signed)
Remote pacemaker transmission.   

## 2018-05-05 NOTE — Progress Notes (Signed)
EPIC Encounter for ICM Monitoring  Patient Name: Marcus [name] is a 84 y.o. male Date: 05/05/2018 Primary Care Physican: Patient, No Pcp Per Primary Cardiologist:[place] Electrophysiologist: Lovena Le Bi-V Pacing: >99% Last Weight: 175lb Today's Weight: unknown                                                            Transmission reviewed. 2 ER visits in November for cellulitis right elbow.      Thoracic impedance normal.   Prescribed: Torsemide20 mg1 tabletdaily.  Labs: 12/27/2017 Creatinine3.16, BUN65, Potassium4.5, Sodium140, [id number] 12/20/2017 Creatinine3.28, BUN70, Potassium5.2, Sodium140, MQTT27-63  12/18/2017 Creatinine3.11, BUN65, Potassium5.7, [id number], KCCQ19-01 10/25/2017 Creatinine 3.28, BUN 94, Potassium 4.6, Sodium 139, EGFR 16-18 10/17/2017 Creatinine 2.96, BUN 64, Potassium 4.8, Sodium 137, EGFR 18-21 05/31/2017 Creatinine 2.74, BUN 66, Potassium 4.7, Sodium 138, EGFR 19-23  Recommendations: None.  Follow-up plan: ICM clinic phone appointment on 06/09/2018.  Office visit with Dr Aundra Dubin 05/25/2018.    Copy of ICM check sent to Dr. Lovena Le.   3 month ICM trend: 05/05/2018    1 Year ICM trend:       Rosalene Billings, RN 05/05/2018 3:25 PM

## 2018-06-04 ENCOUNTER — Other Ambulatory Visit (HOSPITAL_COMMUNITY): Payer: Self-pay | Admitting: Cardiology

## 2018-06-04 ENCOUNTER — Encounter (HOSPITAL_COMMUNITY): Payer: Self-pay | Admitting: Cardiology

## 2018-06-04 ENCOUNTER — Ambulatory Visit (HOSPITAL_COMMUNITY)
Admission: RE | Admit: 2018-06-04 | Discharge: 2018-06-04 | Disposition: A | Discharge status: Home / Self Care | Payer: Medicare Other | Source: Ambulatory Visit | Attending: Cardiology | Admitting: Cardiology

## 2018-06-04 VITALS — BP 100/56 | HR 78 | Wt 180.8 lb

## 2018-06-04 DIAGNOSIS — I480 Paroxysmal atrial fibrillation: Secondary | ICD-10-CM

## 2018-06-04 DIAGNOSIS — I5022 Chronic systolic (congestive) heart failure: Secondary | ICD-10-CM | POA: Insufficient documentation

## 2018-06-04 LAB — COMPREHENSIVE METABOLIC PANEL
ALT: 10 U/L (ref 0–44)
AST: 14 U/L — ABNORMAL LOW (ref 15–41)
Albumin: 3.5 g/dL (ref 3.5–5.0)
Alkaline Phosphatase: 42 U/L (ref 38–126)
Anion gap: 9 (ref 5–15)
BUN: 74 mg/dL — AB (ref 8–23)
CHLORIDE: 105 mmol/L (ref 98–111)
CO2: 22 mmol/L (ref 22–32)
CREATININE: 3.3 mg/dL — AB (ref 0.61–1.24)
Calcium: 9.5 mg/dL (ref 8.9–10.3)
GFR calc Af Amer: 18 mL/min — ABNORMAL LOW (ref >60)
GFR calc non Af Amer: 16 mL/min — ABNORMAL LOW (ref >60)
Glucose, Bld: 111 mg/dL — ABNORMAL HIGH (ref 70–99)
Potassium: 5.4 mmol/L — ABNORMAL HIGH (ref 3.5–5.1)
SODIUM: 136 mmol/L (ref 135–145)
Total Bilirubin: 0.7 mg/dL (ref 0.3–1.2)
Total Protein: 7.3 g/dL (ref 6.5–8.1)

## 2018-06-04 LAB — TSH: TSH: 1.572 u[IU]/mL (ref 0.350–4.500)

## 2018-06-04 LAB — CBC
HEMATOCRIT: 34.4 % — AB (ref 39.0–52.0)
HEMOGLOBIN: 10.5 g/dL — AB (ref 13.0–17.0)
MCH: 30.9 pg (ref 26.0–34.0)
MCHC: 30.5 g/dL (ref 30.0–36.0)
MCV: 101.2 fL — AB (ref 80.0–100.0)
Platelets: 173 10*3/uL (ref 150–400)
RBC: 3.4 MIL/uL — ABNORMAL LOW (ref 4.22–5.81)
RDW: 14.1 % (ref 11.5–15.5)
WBC: 4.4 10*3/uL (ref 4.0–10.5)
nRBC: 0 % (ref 0.0–0.2)

## 2018-06-04 MED ORDER — TORSEMIDE 20 MG PO TABS: ORAL_TABLET | ORAL | 3 refills | Status: DC

## 2018-06-04 NOTE — Patient Instructions (Signed)
Labs were drawn today. We will call you to discuss any ABNORMAL results.   Follow up with lab work in 10 day.  INCREASE Torsemide to 20mg  (1 tab) TWICE A DAY FOR 3 DAYS ONLY. After 3 days begin taking 20mg  (1 tab) in the AM and 10mg  (0.5 tab) in the PM.   Your physician recommends that you schedule a follow-up appointment in 3 weeks.

## 2018-06-05 ENCOUNTER — Telehealth (HOSPITAL_COMMUNITY): Payer: Self-pay

## 2018-06-05 NOTE — Telephone Encounter (Signed)
Pt called no answer unable to leave voice mail because box is full will try to call back later K is high but stopped spironolactone at clinic visit. Repeat BMET in 1 week.  Would be reasonable to refer to nephrology at this point if the patient is willing to do this.

## 2018-06-05 NOTE — Progress Notes (Signed)
Advanced Heart Failure Clinic Note   Patient ID: Marcus [name], male   DOB: 02-03-1929, 85 y.o.   MRN: [id number] EP: Dr Lovena Le  Cardiology: Aundra Dubin  85 y.o. with history of pacemaker for bradycardia, paroxysmal atrial fibrillation, CAD s/p CABG and PCIs, and ischemic cardiomyopathy EF 20-25%. Underwent CRT-D upgrade 10/08/13 due to 85% RV pacing.     Admitted 5/19-5/26/15 with volume overload. He was diuresed with milrinone and IV lasix and discharge weight was 153 lbs. He underwent upgrade of his ICD to CRT-D as well.  He was able to wean off milrinone and did much better after CRT upgrade.  He was admitted in 3/16 with pacemaker pocket infection.  CRT-D device was explanted and temporary-permanent pacemaker with placed.  He later had a St Jude CRT-P system re-implanted.  He was noted to be back in atrial fibrillation and had TEE-guided DCCV to NSR.  TEE showed EF 25-30%.    RHC in 10/17 showed near-normal filling pressures and low but not markedly low cardiac output.  Coreg was cut back to 6.25 mg bid.  Echo in 10/17 showed improvement in LV function, EF 35-40%.  Echo in 1/19 showed EF 35-40%, inferior/inferolateral akinesis.   He returns today for followup of CHF.  Weight is up 7 lbs.  He is unsteady with poor balance, no falls.  He says that he is not lightheaded.  He says that he is short of breath walking up an incline. His wife says that he is short of breath more often than that. No chest pain.    St Jude device interrogated: thoracic impedance decreased, >99% BiV pacing, a-paced 93%.  Labs (3/16): K 4.1 => 4.6, creatinine 1.87 => 1.97, HCT 34.4, TSH normal, LFTs normal.  Labs (6/16): K 4.2, creatinine 2.34 Labs (10/16): K 4.8, creatinine 2.29 Labs (6/17): K 5.1, creatinine 1.89, AST 30, ALT 25, TSH normal, LDL 159, HCT 37 Labs (10/17): K 5, creatinine 2.45, TSH normal, hgb 12.7, BNP 453 Labs (11/17): LFTs normal Labs (12/17): hgb 11.7, creatinine 2.16 Labs (1/18): K 4.9,  creatinine 2.54 Labs (7/18): K 4.3, creatinine 2.87, hgb 12.5 Labs (8/18): K 4.1, creatinine 2.31, LFTs normal, BNP 816, TSH normal Labs (9/18): K 4.4, creatinine 2.41, BNP 761, hgb 12.8 Labs (11/18): K 4.6, creatinine 2.56 Labs (1/19): hgb 13, K 4.7, creatinine 2.74 Labs (5/19): TSH normal, LFTs normal Labs (6/19): K 4.6, creatinine 3.28 Labs (8/19): K 4.5, creatinine 3.16  PMH:  1. Symptomatic bradycardia: Medtronic CRT-D.  He developed pocket infection in 3/16 and had device extracted.  He had St Jude CRT-P system replaced.  2. HTN 3. Hyperlipidemia: Has refused statins.  4. Atrial fibrillation: History of prior DCCV.  Paroxysmal.  On Eliquis.  Has history of cardioembolism to right leg with right popliteal and tibial embolectomy (had been off Eliquis). Has had nausea with amiodarone in the past but now tolerating.  Recurrence in 3/16, had TEE-guided DCCV.  5. Prostate cancer 6. CAD: CABG remotely with SVG-OM and LIMA-LAD.  Late 1990s had PCI to RCA.  In 12/09 had LHC with total occlusion of SVG-OM, patent LIMA-LAD, DES to LCx and DES to RCA.   7. Ischemic cardiomyopathy: Echo (3/15) with EF 20-25%, wall motion abnormalities noted, mild to moderately decreased RV systolic function. ICD upgraded to [place] CRT-D 10/08/13.  TEE (3/16) with EF 25-30%, mildly dilated RV with mildly decreased systolic function, mild to moderate MR.  - RHC (10/17): mean RA 4, PA 44/21 mean 31, unable to  wedge, CI 2.0. - Echo (10/17): EF 35-40%, moderate dilation, inferior/inferolateral AK, moderate MR, RV mildly dilated with mildly decreased systolic function.  - Echo (1/19): EF 35-40%, inferior/inferolateral akinesis. 8. CKD stage IV  SH: Married, nonsmoker, lives in [place]  FH: CAD  ROS: All systems reviewed and negative except as per HPI.   Current Outpatient Medications  Medication Sig Dispense Refill  . amiodarone (PACERONE) 200 MG tablet TAKE 1/2 TABLET (100MG      TOTAL) DAILY 45 tablet 3  .  apixaban (ELIQUIS) 2.5 MG TABS tablet Take 1 tablet (2.5 mg total) by mouth 2 (two) times daily. 180 tablet 2  . carvedilol (COREG) 6.25 MG tablet TAKE 1 TABLET BY MOUTH 2 TIMES A DAY WITH MEALS 180 tablet 1  . Coenzyme Q10 (CO Q 10) 100 MG CAPS Take 1 capsule by mouth 2 (two) times daily.    . Multiple Vitamins-Minerals (MULTIVITAMINS/MINERALS ADULT PO) Take by mouth daily. MacuGuard    . Multiple Vitamins-Minerals (PRESERVISION AREDS 2 PO) Take 1 capsule by mouth 2 (two) times daily.    Marland Kitchen torsemide (DEMADEX) 20 MG tablet Take 1 tablet (20 mg total) by mouth every morning AND 0.5 tablets (10 mg total) at bedtime. 90 tablet 3  . doxycycline (VIBRAMYCIN) 100 MG capsule Take 1 capsule (100 mg total) by mouth 2 (two) times daily. (Patient not taking: Reported on 06/04/2018) 8 capsule 0  . predniSONE (DELTASONE) 10 MG tablet Take 4 tablets (40 mg total) by mouth daily. (Patient not taking: Reported on 06/04/2018) 16 tablet 0   No current facility-administered medications for this encounter.     Vitals:   06/04/18 1433  BP: (!) 100/56  Pulse: 78  SpO2: 96%  Weight: 82 kg (180 lb 12.8 oz)   Wt Readings from Last 3 Encounters:  06/04/18 82 kg (180 lb 12.8 oz)  12/18/17 78.7 kg (173 lb 9.6 oz)  10/17/17 79.8 kg (176 lb)    General: NAD Neck: JVP 8 with HJR, no thyromegaly or thyroid nodule.  Lungs: Clear to auscultation bilaterally with normal respiratory effort. CV: Nondisplaced PMI.  Heart regular S1/S2, no S3/S4, no murmur.  1+ ankle edema.  No carotid bruit.  Normal pedal pulses.  Abdomen: Soft, nontender, no hepatosplenomegaly, no distention.  Skin: Intact without lesions or rashes.  Neurologic: Alert and oriented x 3.  Psych: Normal affect. Extremities: No clubbing or cyanosis.  HEENT: Normal.   Assessment/Plan:  1. Chronic systolic CHF: Ischemic cardiomyopathy.  Echo in 1/19 showed EF 35-40% (stable from most recent prior).  Has St Jude CRT-P device.  NYHA class II-III symptoms,  mildly worse.  He is volume overloaded by exam and Corevue, and weight is up 7 lbs.  - Increase torsemide to 20 mg bid x 3 days then 20 qam/10 qpm.  BMET today and 10 days.     - Continue current Coreg 6.25 mg bid.   - With creatinine > 3, he needs to stop spironolactone.  - Not on ACEI/ARB/ARNI with CKD. 2. Atrial fibrillation: Paroxysmal. He is in NSR on amiodarone 100 mg daily.  Minimal atrial fibrillation on device interrogation.  - Check LFTs and TSH today.  Needs regular eye exam. - He is on Eliquis 2.5 mg bid (age, elevated creatinine). CBC today. 3. CAD: Stable. No chest pain. Continue medical management.  - He is not currently on a statin (he has refused to take a statin).   - He is not on ASA given use of Eliquis and stable CAD.  4. Sick sinus syndrome: s/p CRT-P. 5. CKD, stage IV: BMET today.     Followup in 3 wks with NP/PA to reassess volume.   [name] 06/05/2018

## 2018-06-09 ENCOUNTER — Ambulatory Visit (INDEPENDENT_AMBULATORY_CARE_PROVIDER_SITE_OTHER): Payer: Medicare Other

## 2018-06-09 DIAGNOSIS — Z950 Presence of cardiac pacemaker: Secondary | ICD-10-CM | POA: Diagnosis not present

## 2018-06-09 DIAGNOSIS — I5022 Chronic systolic (congestive) heart failure: Secondary | ICD-10-CM

## 2018-06-09 NOTE — Progress Notes (Signed)
EPIC Encounter for ICM Monitoring  Patient Name: Marcus [name] is a 85 y.o. male Date: 06/09/2018 Primary Care Physican: Patient, No Pcp Per Primary [place] Electrophysiologist: Lovena Le Dry Weight:175lbs  Bi-V Pacing: >99%                                           Spoke with wife.  Heart Failure questions reviewed, pt asymptomatic.   Thoracic impedance normal.   Prescribed: Torsemide20 mgTake 1 tablet (20 mg total) by mouth every morning AND 0.5 tablets (10 mg total) at bedtime.  Labs: 12/27/2017 Creatinine3.16, BUN65, Potassium4.5, Sodium140, [id number] 12/20/2017 Creatinine3.28, BUN70, Potassium5.2, Sodium140, [id number]  12/18/2017 Creatinine3.11, BUN65, Potassium5.7, [id number], ALPF79-02 10/25/2017 Creatinine 3.28, BUN 94, Potassium 4.6, Sodium 139, EGFR 16-18 10/17/2017 Creatinine 2.96, BUN 64, Potassium 4.8, Sodium 137, EGFR 18-21 05/31/2017 Creatinine 2.74, BUN 66, Potassium 4.7, Sodium 138, EGFR 19-23  Recommendations: No changes.  He has been more restrictive with salt intake.  Encouraged to call for fluid symptoms.  Follow-up plan: ICM clinic phone appointment on 07/14/2018.   Office appointment scheduled 06/25/2018 with [facility] NP/PA.   Copy of ICM check sent to Dr. Lovena Le.    3 month ICM trend: 06/09/2018    1 Year ICM trend:       Rosalene Billings, RN 06/09/2018 4:44 PM

## 2018-06-14 LAB — CUP PACEART REMOTE DEVICE CHECK
Battery Remaining Longevity: 35 mo
Battery Voltage: 2.92 V
Brady Statistic AP VS Percent: 1 %
Brady Statistic AS VS Percent: 1 %
Implantable Lead Implant Date: 20160304
Implantable Lead Implant Date: 20160304
Implantable Lead Implant Date: 20160304
Implantable Lead Location: 753858
Implantable Lead Location: 753859
Implantable Lead Location: 753860
Implantable Pulse Generator Implant Date: 20160304
Lead Channel Impedance Value: 450 Ohm
Lead Channel Impedance Value: 930 Ohm
Lead Channel Pacing Threshold Amplitude: 1.75 V
Lead Channel Sensing Intrinsic Amplitude: 11.3 mV
Lead Channel Setting Pacing Amplitude: 2 V
Lead Channel Setting Pacing Amplitude: 3.25 V
Lead Channel Setting Pacing Pulse Width: 0.5 ms
Lead Channel Setting Pacing Pulse Width: 0.6 ms
MDC IDC MSMT BATTERY REMAINING PERCENTAGE: 71 %
MDC IDC MSMT LEADCHNL LV PACING THRESHOLD AMPLITUDE: 0.75 V
MDC IDC MSMT LEADCHNL LV PACING THRESHOLD PULSEWIDTH: 0.6 ms
MDC IDC MSMT LEADCHNL RA PACING THRESHOLD PULSEWIDTH: 1 ms
MDC IDC MSMT LEADCHNL RA SENSING INTR AMPL: 0.8 mV
MDC IDC MSMT LEADCHNL RV IMPEDANCE VALUE: 460 Ohm
MDC IDC MSMT LEADCHNL RV PACING THRESHOLD AMPLITUDE: 0.875 V
MDC IDC MSMT LEADCHNL RV PACING THRESHOLD PULSEWIDTH: 0.5 ms
MDC IDC PG SERIAL: 7725037
MDC IDC SESS DTM: 20191216070015
MDC IDC SET LEADCHNL LV PACING AMPLITUDE: 2 V
MDC IDC SET LEADCHNL RV SENSING SENSITIVITY: 5 mV
MDC IDC STAT BRADY AP VP PERCENT: 93 %
MDC IDC STAT BRADY AS VP PERCENT: 7.4 %
MDC IDC STAT BRADY RA PERCENT PACED: 92 %

## 2018-06-18 ENCOUNTER — Ambulatory Visit (HOSPITAL_COMMUNITY)
Admission: RE | Admit: 2018-06-18 | Discharge: 2018-06-18 | Disposition: A | Discharge status: Home / Self Care | Payer: Medicare Other | Source: Ambulatory Visit | Attending: Internal Medicine | Admitting: Internal Medicine

## 2018-06-18 DIAGNOSIS — I5022 Chronic systolic (congestive) heart failure: Secondary | ICD-10-CM | POA: Diagnosis present

## 2018-06-18 LAB — BASIC METABOLIC PANEL
ANION GAP: 9 (ref 5–15)
BUN: 79 mg/dL — ABNORMAL HIGH (ref 8–23)
CALCIUM: 9.3 mg/dL (ref 8.9–10.3)
CHLORIDE: 107 mmol/L (ref 98–111)
CO2: 22 mmol/L (ref 22–32)
CREATININE: 3.13 mg/dL — AB (ref 0.61–1.24)
GFR calc non Af Amer: 17 mL/min — ABNORMAL LOW (ref >60)
GFR, EST AFRICAN AMERICAN: 20 mL/min — AB (ref >60)
Glucose, Bld: 109 mg/dL — ABNORMAL HIGH (ref 70–99)
Potassium: 4.9 mmol/L (ref 3.5–5.1)
SODIUM: 138 mmol/L (ref 135–145)

## 2018-06-24 NOTE — Progress Notes (Signed)
Advanced Heart Failure Clinic Note   Patient ID: FLEET HIGHAM, male   DOB: 03/19/1929, 85 y.o.   MRN: [id number] EP: Dr Lovena Le  Cardiology: Aundra Dubin  85 y.o. with history of pacemaker for bradycardia, paroxysmal atrial fibrillation, CAD s/p CABG and PCIs, and ischemic cardiomyopathy EF 20-25%. Underwent CRT-D upgrade 10/08/13 due to 85% RV pacing.     Admitted 5/19-5/26/15 with volume overload. He was diuresed with milrinone and IV lasix and discharge weight was 153 lbs. He underwent upgrade of his ICD to CRT-D as well.  He was able to wean off milrinone and did much better after CRT upgrade.  He was admitted in 3/16 with pacemaker pocket infection.  CRT-D device was explanted and temporary-permanent pacemaker with placed.  He later had a St Jude CRT-P system re-implanted.  He was noted to be back in atrial fibrillation and had TEE-guided DCCV to NSR.  TEE showed EF 25-30%.    RHC in 10/17 showed near-normal filling pressures and low but not markedly low cardiac output.  Coreg was cut back to 6.25 mg bid.  Echo in 10/17 showed improvement in LV function, EF 35-40%.  Echo in 1/19 showed EF 35-40%, inferior/inferolateral akinesis.   Today he returns for HF follow up. Last visit torsemide was increased for a couple of days and spiro was stopped due to elevated creatinine. He stopped torsemide for 3 days because he had "kidney pain". Yesterday he restarted 20 mg torsemide daily.   Overall feeling terrible. Complaining of fatigue and increased shortness of breath. SOB at rest. SOB walking in his driveway. Denies PND/Orthopnea. Appetite ok. No fever or chills. Weight at home has been trending up 164--->176 pounds. Taking all medications.  St Jude device interrogated: Corvue elevated for last 10 days. .   Labs (3/16): K 4.1 => 4.6, creatinine 1.87 => 1.97, HCT 34.4, TSH normal, LFTs normal.  Labs (6/16): K 4.2, creatinine 2.34 Labs (10/16): K 4.8, creatinine 2.29 Labs (6/17): K 5.1, creatinine 1.89,  AST 30, ALT 25, TSH normal, LDL 159, HCT 37 Labs (10/17): K 5, creatinine 2.45, TSH normal, hgb 12.7, BNP 453 Labs (11/17): LFTs normal Labs (12/17): hgb 11.7, creatinine 2.16 Labs (1/18): K 4.9, creatinine 2.54 Labs (7/18): K 4.3, creatinine 2.87, hgb 12.5 Labs (8/18): K 4.1, creatinine 2.31, LFTs normal, BNP 816, TSH normal Labs (9/18): K 4.4, creatinine 2.41, BNP 761, hgb 12.8 Labs (11/18): K 4.6, creatinine 2.56 Labs (1/19): hgb 13, K 4.7, creatinine 2.74 Labs (5/19): TSH normal, LFTs normal Labs (6/19): K 4.6, creatinine 3.28 Labs (8/19): K 4.5, creatinine 3.16 Labs (06/18/18): K 4.9 Creatinine 3.1   PMH:  1. Symptomatic bradycardia: Medtronic CRT-D.  He developed pocket infection in 3/16 and had device extracted.  He had St Jude CRT-P system replaced.  2. HTN 3. Hyperlipidemia: Has refused statins.  4. Atrial fibrillation: History of prior DCCV.  Paroxysmal.  On Eliquis.  Has history of cardioembolism to right leg with right popliteal and tibial embolectomy (had been off Eliquis). Has had nausea with amiodarone in the past but now tolerating.  Recurrence in 3/16, had TEE-guided DCCV.  5. Prostate cancer 6. CAD: CABG remotely with SVG-OM and LIMA-LAD.  Late 1990s had PCI to RCA.  In 12/09 had LHC with total occlusion of SVG-OM, patent LIMA-LAD, DES to LCx and DES to RCA.   7. Ischemic cardiomyopathy: Echo (3/15) with EF 20-25%, wall motion abnormalities noted, mild to moderately decreased RV systolic function. ICD upgraded to [place] CRT-D 10/08/13.  TEE (3/16) with EF 25-30%, mildly dilated RV with mildly decreased systolic function, mild to moderate MR.  - RHC (10/17): mean RA 4, PA 44/21 mean 31, unable to wedge, CI 2.0. - Echo (10/17): EF 35-40%, moderate dilation, inferior/inferolateral AK, moderate MR, RV mildly dilated with mildly decreased systolic function.  - Echo (1/19): EF 35-40%, inferior/inferolateral akinesis. 8. CKD stage IV  SH: Married, nonsmoker, lives in  [place]  FH: CAD  ROS: All systems reviewed and negative except as per HPI.   Current Outpatient Medications  Medication Sig Dispense Refill  . amiodarone (PACERONE) 200 MG tablet TAKE 1/2 TABLET (100MG      TOTAL) DAILY 45 tablet 3  . apixaban (ELIQUIS) 2.5 MG TABS tablet Take 1 tablet (2.5 mg total) by mouth 2 (two) times daily. 180 tablet 2  . carvedilol (COREG) 6.25 MG tablet TAKE 1 TABLET BY MOUTH 2 TIMES A DAY WITH MEALS 180 tablet 1  . Coenzyme Q10 (CO Q 10) 100 MG CAPS Take 1 capsule by mouth 2 (two) times daily.    . Multiple Vitamins-Minerals (MULTIVITAMINS/MINERALS ADULT PO) Take by mouth daily. MacuGuard    . Multiple Vitamins-Minerals (PRESERVISION AREDS 2 PO) Take 1 capsule by mouth 2 (two) times daily.    Marland Kitchen torsemide (DEMADEX) 20 MG tablet Take 1 tablet (20 mg total) by mouth every morning AND 0.5 tablets (10 mg total) at bedtime. 90 tablet 3   No current facility-administered medications for this encounter.     Vitals:   06/25/18 1104  BP: 98/82  Pulse: 86  SpO2: 99%  Weight: 82.3 kg (181 lb 6.4 oz)   Wt Readings from Last 3 Encounters:  06/25/18 82.3 kg (181 lb 6.4 oz)  06/04/18 82 kg (180 lb 12.8 oz)  12/18/17 78.7 kg (173 lb 9.6 oz)    General:  Elderly. Appears chronically ill. No resp difficulty HEENT: normal Neck: supple. JVP to jaw Carotids 2+ bilat; no bruits. No lymphadenopathy or thryomegaly appreciated. Cor: PMI nondisplaced. Regular rate & rhythm. No rubs, gallops or murmurs. Lungs: clear Abdomen: soft, nontender, distended. No hepatosplenomegaly. No bruits or masses. Good bowel sounds. Extremities: no cyanosis, clubbing, rash, R and LLE 1-2+ edema Neuro: alert & orientedx3, cranial nerves grossly intact. moves all 4 extremities w/o difficulty. Affect pleasant Assessment/Plan:  1. Chronic systolic CHF: Ischemic cardiomyopathy.  Echo in 1/19 showed EF 35-40% (stable from most recent prior).  Has St Jude CRT-P device.   NYHA  III-IV. Volume status  elevated. He was given 80  Mg IV lasix in the clinic with 650cc clear urine output. I discussed adding metolazone but wants to try IV lasix first. He will continue torsemide 20 mg daily. He does not want to take torsemide twice a day but may need to discuss again next week.   - Continue current Coreg 6.25 mg bid.   - Off spiro with increased creatinine and potassium .   - Not on ACEI/ARB/ARNI with CKD. 2. Atrial fibrillation: Paroxysmal. Maintaining NSR. Continue amiodarone 100 mg daily.  Minimal atrial fibrillation on device interrogation.  - Recent LFTs and TSH stable.   Needs regular eye exam. - He is on Eliquis 2.5 mg bid (age, elevated creatinine).  3. CAD: Stable. No chest pain. Continue medical management.  - He is not currently on a statin (he has refused to take a statin).   - He is not on ASA given use of Eliquis and stable CAD.  4. Sick sinus syndrome: s/p CRT-P. 5. CKD, stage IV:  Check BMET    Instructed to avoid NSAIDs. Referred to Nephrology.  6. Hyperkalemia: K elevated. Instructed him to stop MVI due to potassium and keep off spiro.   Follow up next week to reassess volume status and check BMET   Lakrisha Iseman NP-C  06/25/2018

## 2018-06-25 ENCOUNTER — Encounter (HOSPITAL_COMMUNITY): Payer: Self-pay

## 2018-06-25 ENCOUNTER — Other Ambulatory Visit: Payer: Self-pay

## 2018-06-25 ENCOUNTER — Telehealth (HOSPITAL_COMMUNITY): Payer: Self-pay

## 2018-06-25 ENCOUNTER — Ambulatory Visit (HOSPITAL_COMMUNITY)
Admission: RE | Admit: 2018-06-25 | Discharge: 2018-06-25 | Disposition: A | Discharge status: Home / Self Care | Payer: Medicare Other | Source: Ambulatory Visit | Attending: Internal Medicine | Admitting: Internal Medicine

## 2018-06-25 VITALS — BP 98/82 | HR 86 | Wt 181.4 lb

## 2018-06-25 DIAGNOSIS — I495 Sick sinus syndrome: Secondary | ICD-10-CM | POA: Diagnosis not present

## 2018-06-25 DIAGNOSIS — I5023 Acute on chronic systolic (congestive) heart failure: Secondary | ICD-10-CM | POA: Diagnosis not present

## 2018-06-25 DIAGNOSIS — N184 Chronic kidney disease, stage 4 (severe): Secondary | ICD-10-CM | POA: Diagnosis not present

## 2018-06-25 DIAGNOSIS — I480 Paroxysmal atrial fibrillation: Secondary | ICD-10-CM | POA: Diagnosis not present

## 2018-06-25 DIAGNOSIS — Z79899 Other long term (current) drug therapy: Secondary | ICD-10-CM | POA: Insufficient documentation

## 2018-06-25 DIAGNOSIS — E875 Hyperkalemia: Secondary | ICD-10-CM | POA: Diagnosis not present

## 2018-06-25 DIAGNOSIS — Z7901 Long term (current) use of anticoagulants: Secondary | ICD-10-CM | POA: Diagnosis not present

## 2018-06-25 DIAGNOSIS — Z951 Presence of aortocoronary bypass graft: Secondary | ICD-10-CM | POA: Diagnosis not present

## 2018-06-25 DIAGNOSIS — I5022 Chronic systolic (congestive) heart failure: Secondary | ICD-10-CM | POA: Diagnosis present

## 2018-06-25 DIAGNOSIS — Z8249 Family history of ischemic heart disease and other diseases of the circulatory system: Secondary | ICD-10-CM | POA: Insufficient documentation

## 2018-06-25 DIAGNOSIS — E785 Hyperlipidemia, unspecified: Secondary | ICD-10-CM | POA: Insufficient documentation

## 2018-06-25 DIAGNOSIS — Z8546 Personal history of malignant neoplasm of prostate: Secondary | ICD-10-CM | POA: Insufficient documentation

## 2018-06-25 DIAGNOSIS — I255 Ischemic cardiomyopathy: Secondary | ICD-10-CM

## 2018-06-25 DIAGNOSIS — I130 Hypertensive heart and chronic kidney disease with heart failure and stage 1 through stage 4 chronic kidney disease, or unspecified chronic kidney disease: Secondary | ICD-10-CM | POA: Insufficient documentation

## 2018-06-25 DIAGNOSIS — I2510 Atherosclerotic heart disease of native coronary artery without angina pectoris: Secondary | ICD-10-CM | POA: Diagnosis not present

## 2018-06-25 LAB — BASIC METABOLIC PANEL
Anion gap: 11 (ref 5–15)
BUN: 60 mg/dL — ABNORMAL HIGH (ref 8–23)
CALCIUM: 9.3 mg/dL (ref 8.9–10.3)
CO2: 19 mmol/L — ABNORMAL LOW (ref 22–32)
CREATININE: 2.89 mg/dL — AB (ref 0.61–1.24)
Chloride: 110 mmol/L (ref 98–111)
GFR calc non Af Amer: 19 mL/min — ABNORMAL LOW (ref >60)
GFR, EST AFRICAN AMERICAN: 22 mL/min — AB (ref >60)
GLUCOSE: 104 mg/dL — AB (ref 70–99)
Potassium: 5.2 mmol/L — ABNORMAL HIGH (ref 3.5–5.1)
Sodium: 140 mmol/L (ref 135–145)

## 2018-06-25 LAB — BRAIN NATRIURETIC PEPTIDE: B Natriuretic Peptide: 800.3 pg/mL — ABNORMAL HIGH (ref 0.0–100.0)

## 2018-06-25 MED ORDER — FUROSEMIDE 10 MG/ML IJ SOLN
80.0000 mg | Freq: Once | INTRAMUSCULAR | Status: AC
  Administered 2018-06-25: 80 mg via INTRAVENOUS
  Filled 2018-06-25: qty 8

## 2018-06-25 NOTE — Patient Instructions (Addendum)
STOP ALL Multi Vitamins, be sure to stop all NSAID's (Aleve, Advil, Motrin)  Labs today We will only contact you if something comes back abnormal or we need to make some changes. Otherwise no news is good news!   You have been referred to [facility], they will be in contact with you regarding an appointment  Your physician recommends that you schedule a follow-up appointment in: 1 week

## 2018-06-25 NOTE — Telephone Encounter (Signed)
Referral faxed to Nephrology ([place]), confirmed receipt.

## 2018-06-30 NOTE — Progress Notes (Signed)
Advanced Heart Failure Clinic Note   Patient ID: Marcus [name], male   DOB: April 18, 1929, 85 y.o.   MRN: [id number] EP: Dr Lovena Le  Cardiology: Aundra Dubin  85 y.o. with history of pacemaker for bradycardia, paroxysmal atrial fibrillation, CAD s/p CABG and PCIs, and ischemic cardiomyopathy EF 20-25%. Underwent CRT-D upgrade 10/08/13 due to 85% RV pacing.     Admitted 5/19-5/26/15 with volume overload. He was diuresed with milrinone and IV lasix and discharge weight was 153 lbs. He underwent upgrade of his ICD to CRT-D as well.  He was able to wean off milrinone and did much better after CRT upgrade.  He was admitted in 3/16 with pacemaker pocket infection.  CRT-D device was explanted and temporary-permanent pacemaker with placed.  He later had a St Jude CRT-P system re-implanted.  He was noted to be back in atrial fibrillation and had TEE-guided DCCV to NSR.  TEE showed EF 25-30%.    RHC in 10/17 showed near-normal filling pressures and low but not markedly low cardiac output.  Coreg was cut back to 6.25 mg bid.  Echo in 10/17 showed improvement in LV function, EF 35-40%.  Echo in 1/19 showed EF 35-40%, inferior/inferolateral akinesis.   He returns today for 1 week follow up. Last week he was given 80 mg IV lasix in clinic. He was also instructed to continue to take torsemide 20 mg daily. He was also referred to nephrology. Overall doing about the same. He is still SOB when walking around the house, baseline he is closer to NYHA II. Not much UOP with torsemide. + bloating. +BLE edema. No orthopnea/PND. No cough, fever, or chills. No CP or dizziness. Wife says gait is usnteady. No falls. Does not use cane/walker. No bleeding on apixiban. Weight is down 6 lbs on his home scale. 176 -> 170 lb. Weight down 1 lb on our scale. Limits fluid intake to <40 oz/daily.  Corvue: Thoracic impedence just above threshold and trending down. >99% BiV pacing. <1% AF/AT. Active 15-30 hours/day.  Labs (3/16): K 4.1 => 4.6,  creatinine 1.87 => 1.97, HCT 34.4, TSH normal, LFTs normal.  Labs (6/16): K 4.2, creatinine 2.34 Labs (10/16): K 4.8, creatinine 2.29 Labs (6/17): K 5.1, creatinine 1.89, AST 30, ALT 25, TSH normal, LDL 159, HCT 37 Labs (10/17): K 5, creatinine 2.45, TSH normal, hgb 12.7, BNP 453 Labs (11/17): LFTs normal Labs (12/17): hgb 11.7, creatinine 2.16 Labs (1/18): K 4.9, creatinine 2.54 Labs (7/18): K 4.3, creatinine 2.87, hgb 12.5 Labs (8/18): K 4.1, creatinine 2.31, LFTs normal, BNP 816, TSH normal Labs (9/18): K 4.4, creatinine 2.41, BNP 761, hgb 12.8 Labs (11/18): K 4.6, creatinine 2.56 Labs (1/19): hgb 13, K 4.7, creatinine 2.74 Labs (5/19): TSH normal, LFTs normal Labs (6/19): K 4.6, creatinine 3.28 Labs (8/19): K 4.5, creatinine 3.16 Labs (06/18/18): K 4.9 Creatinine 3.1   PMH:  1. Symptomatic bradycardia: Medtronic CRT-D.  He developed pocket infection in 3/16 and had device extracted.  He had St Jude CRT-P system replaced.  2. HTN 3. Hyperlipidemia: Has refused statins.  4. Atrial fibrillation: History of prior DCCV.  Paroxysmal.  On Eliquis.  Has history of cardioembolism to right leg with right popliteal and tibial embolectomy (had been off Eliquis). Has had nausea with amiodarone in the past but now tolerating.  Recurrence in 3/16, had TEE-guided DCCV.  5. Prostate cancer 6. CAD: CABG remotely with SVG-OM and LIMA-LAD.  Late 1990s had PCI to RCA.  In 12/09 had LHC with total occlusion  of SVG-OM, patent LIMA-LAD, DES to LCx and DES to RCA.   7. Ischemic cardiomyopathy: Echo (3/15) with EF 20-25%, wall motion abnormalities noted, mild to moderately decreased RV systolic function. ICD upgraded to [place] CRT-D 10/08/13.  TEE (3/16) with EF 25-30%, mildly dilated RV with mildly decreased systolic function, mild to moderate MR.  - RHC (10/17): mean RA 4, PA 44/21 mean 31, unable to wedge, CI 2.0. - Echo (10/17): EF 35-40%, moderate dilation, inferior/inferolateral AK, moderate MR, RV mildly  dilated with mildly decreased systolic function.  - Echo (1/19): EF 35-40%, inferior/inferolateral akinesis. 8. CKD stage IV  SH: Married, nonsmoker, lives in [place]  FH: CAD  Review of systems complete and found to be negative unless listed in HPI.   Current Outpatient Medications  Medication Sig Dispense Refill  . amiodarone (PACERONE) 200 MG tablet TAKE 1/2 TABLET (100MG      TOTAL) DAILY 45 tablet 3  . apixaban (ELIQUIS) 2.5 MG TABS tablet Take 1 tablet (2.5 mg total) by mouth 2 (two) times daily. 180 tablet 2  . carvedilol (COREG) 6.25 MG tablet TAKE 1 TABLET BY MOUTH TWICE A DAY WITH MEALS 180 tablet 1  . Coenzyme Q10 (CO Q 10) 100 MG CAPS Take 1 capsule by mouth 2 (two) times daily.    Marland Kitchen torsemide (DEMADEX) 20 MG tablet Take 20 mg by mouth daily.     No current facility-administered medications for this encounter.     Vitals:   07/01/18 1100  BP: 112/70  Pulse: 73  SpO2: 97%  Weight: 81.9 kg (180 lb 9.6 oz)   Wt Readings from Last 3 Encounters:  07/01/18 81.9 kg (180 lb 9.6 oz)  06/25/18 82.3 kg (181 lb 6.4 oz)  06/04/18 82 kg (180 lb 12.8 oz)    General: Elderly. No resp difficulty. Walked into clinic without difficulty. HEENT: Normal Neck: Supple. JVP 10-12. Carotids 2+ bilat; no bruits. No thyromegaly or nodule noted. Cor: PMI nondisplaced. RRR, No M/G/R noted Lungs: fine crackles in bases. Abdomen: Soft, non-tender, non-distended, no HSM. No bruits or masses. +BS  Extremities: No cyanosis, clubbing, or rash. R and LLE 1+ edema to knees.  Neuro: Alert & orientedx3, cranial nerves grossly intact. moves all 4 extremities w/o difficulty. Affect pleasant  Assessment/Plan:  1. Chronic systolic CHF: Ischemic cardiomyopathy.  Echo in 1/19 showed EF 35-40% (stable from most recent prior).  Has St Jude CRT-P device.   - NYHA III-IV. - Volume remains elevated, but somewhat improved.  - Increase torsemide to 30 mg daily. Recommended increasing to 40 mg daily, but he  refused due to bilateral low back pain thought to be his kidneys. Pain resolves when he decreases diuretics. He agreed to trying 30 mg daily. Advised him to try Tylenol to help with back pain. He will also ask nephrology about this. Sees Renal on Thursday. BMET today.  - Continue coreg 6.25 mg bid.   - Off spiro with increased creatinine and potassium. - Not on ACEI/ARB/ARNI with CKD. 2. Atrial fibrillation: Paroxysmal. - Regular on exam. Continue amiodarone 100 mg daily.  <1% AF burden on device interrogation.  - Recent LFTs and TSH stable. Needs regular eye exam. - He is on Eliquis 2.5 mg bid (age, elevated creatinine).  3. CAD: No chest pain. Continue medical management.  - Refuses statin.  - He is not on ASA given use of Eliquis and stable CAD.  4. Sick sinus syndrome: s/p CRT-P. No change.  5. CKD, stage IV: BMET today.  -  Instructed to avoid NSAIDs. Referred to Nephrology. Sees them Thursday.  6. Hyperkalemia: K 5.2 last week. Off spiro. Recheck BMET today.  7. Unsteady gait - Refer for home PT eval.   BMET today. Increase torsemide to 30 mg daily.  Follow up 2-3 weeks. Sees Renal Thursday.  Georgiana Shore NP-C  07/01/2018  Greater than 50% of the 25 minute visit was spent in counseling/coordination of care regarding disease state education, salt/fluid restriction, sliding scale diuretics, and medication compliance.

## 2018-07-01 ENCOUNTER — Other Ambulatory Visit (HOSPITAL_COMMUNITY): Payer: Self-pay | Admitting: Cardiology

## 2018-07-01 ENCOUNTER — Ambulatory Visit (HOSPITAL_COMMUNITY)
Admission: RE | Admit: 2018-07-01 | Discharge: 2018-07-01 | Disposition: A | Discharge status: Home / Self Care | Payer: Medicare Other | Source: Ambulatory Visit | Attending: Cardiology | Admitting: Cardiology

## 2018-07-01 VITALS — BP 112/70 | HR 73 | Wt 180.6 lb

## 2018-07-01 DIAGNOSIS — E875 Hyperkalemia: Secondary | ICD-10-CM | POA: Diagnosis not present

## 2018-07-01 DIAGNOSIS — N183 Chronic kidney disease, stage 3 unspecified: Secondary | ICD-10-CM

## 2018-07-01 DIAGNOSIS — E785 Hyperlipidemia, unspecified: Secondary | ICD-10-CM | POA: Diagnosis not present

## 2018-07-01 DIAGNOSIS — M545 Low back pain: Secondary | ICD-10-CM | POA: Diagnosis not present

## 2018-07-01 DIAGNOSIS — I2583 Coronary atherosclerosis due to lipid rich plaque: Secondary | ICD-10-CM

## 2018-07-01 DIAGNOSIS — Z8249 Family history of ischemic heart disease and other diseases of the circulatory system: Secondary | ICD-10-CM | POA: Diagnosis not present

## 2018-07-01 DIAGNOSIS — I5022 Chronic systolic (congestive) heart failure: Secondary | ICD-10-CM | POA: Diagnosis not present

## 2018-07-01 DIAGNOSIS — R2681 Unsteadiness on feet: Secondary | ICD-10-CM | POA: Diagnosis not present

## 2018-07-01 DIAGNOSIS — Z79899 Other long term (current) drug therapy: Secondary | ICD-10-CM | POA: Diagnosis not present

## 2018-07-01 DIAGNOSIS — N184 Chronic kidney disease, stage 4 (severe): Secondary | ICD-10-CM

## 2018-07-01 DIAGNOSIS — I495 Sick sinus syndrome: Secondary | ICD-10-CM | POA: Diagnosis not present

## 2018-07-01 DIAGNOSIS — Z955 Presence of coronary angioplasty implant and graft: Secondary | ICD-10-CM | POA: Diagnosis not present

## 2018-07-01 DIAGNOSIS — I5023 Acute on chronic systolic (congestive) heart failure: Secondary | ICD-10-CM

## 2018-07-01 DIAGNOSIS — I130 Hypertensive heart and chronic kidney disease with heart failure and stage 1 through stage 4 chronic kidney disease, or unspecified chronic kidney disease: Secondary | ICD-10-CM | POA: Diagnosis not present

## 2018-07-01 DIAGNOSIS — R609 Edema, unspecified: Secondary | ICD-10-CM | POA: Diagnosis not present

## 2018-07-01 DIAGNOSIS — R0602 Shortness of breath: Secondary | ICD-10-CM | POA: Insufficient documentation

## 2018-07-01 DIAGNOSIS — Z7901 Long term (current) use of anticoagulants: Secondary | ICD-10-CM | POA: Insufficient documentation

## 2018-07-01 DIAGNOSIS — I480 Paroxysmal atrial fibrillation: Secondary | ICD-10-CM

## 2018-07-01 DIAGNOSIS — I255 Ischemic cardiomyopathy: Secondary | ICD-10-CM | POA: Diagnosis not present

## 2018-07-01 DIAGNOSIS — I2510 Atherosclerotic heart disease of native coronary artery without angina pectoris: Secondary | ICD-10-CM | POA: Diagnosis not present

## 2018-07-01 DIAGNOSIS — Z951 Presence of aortocoronary bypass graft: Secondary | ICD-10-CM | POA: Insufficient documentation

## 2018-07-01 DIAGNOSIS — R140 Abdominal distension (gaseous): Secondary | ICD-10-CM | POA: Diagnosis not present

## 2018-07-01 LAB — BASIC METABOLIC PANEL
Anion gap: 12 (ref 5–15)
BUN: 61 mg/dL — ABNORMAL HIGH (ref 8–23)
CHLORIDE: 105 mmol/L (ref 98–111)
CO2: 22 mmol/L (ref 22–32)
CREATININE: 3.51 mg/dL — AB (ref 0.61–1.24)
Calcium: 9.1 mg/dL (ref 8.9–10.3)
GFR, EST AFRICAN AMERICAN: 17 mL/min — AB (ref >60)
GFR, EST NON AFRICAN AMERICAN: 15 mL/min — AB (ref >60)
Glucose, Bld: 106 mg/dL — ABNORMAL HIGH (ref 70–99)
POTASSIUM: 4.5 mmol/L (ref 3.5–5.1)
Sodium: 139 mmol/L (ref 135–145)

## 2018-07-01 MED ORDER — TORSEMIDE 20 MG PO TABS: 20.0000 mg | ORAL_TABLET | Freq: Every day | ORAL | 3 refills | Status: DC

## 2018-07-01 NOTE — Patient Instructions (Signed)
Labs drawn today. We will call you if they are abnormal, otherwise, no news is good news.  INCREASE torsemide to 30 mg daily (take 1 tab (20 mg) in the morning and 1/2 tab (10 mg) in the evening.  Take Tylenol for back pain.  Follow up visit in 2-3 weeks.  Referral to Home PT eval has been made and someone will call you.

## 2018-07-11 ENCOUNTER — Other Ambulatory Visit: Payer: Self-pay | Admitting: Nephrology

## 2018-07-11 DIAGNOSIS — D631 Anemia in chronic kidney disease: Secondary | ICD-10-CM

## 2018-07-11 DIAGNOSIS — N184 Chronic kidney disease, stage 4 (severe): Secondary | ICD-10-CM

## 2018-07-11 DIAGNOSIS — N189 Chronic kidney disease, unspecified: Secondary | ICD-10-CM

## 2018-07-14 ENCOUNTER — Ambulatory Visit (INDEPENDENT_AMBULATORY_CARE_PROVIDER_SITE_OTHER): Payer: Medicare Other

## 2018-07-14 DIAGNOSIS — Z950 Presence of cardiac pacemaker: Secondary | ICD-10-CM | POA: Diagnosis not present

## 2018-07-14 DIAGNOSIS — I5022 Chronic systolic (congestive) heart failure: Secondary | ICD-10-CM | POA: Diagnosis not present

## 2018-07-15 NOTE — Progress Notes (Signed)
EPIC Encounter for ICM Monitoring  Patient Name: Marcus [name] is a 85 y.o. male Date: 07/15/2018 Primary Care Physican: Patient, No Pcp Per Primary Cardiologist:[place] Electrophysiologist: Lovena Le Bi-V Pacing: >99% Last Weight:175lbs  Today's Weight: 174 lbs   Spoke with wife.Heart Failure questions reviewed, pt has shortness of breath on minimal exertion low stamina and leg weakness but all are at baseline.  Taking Tylenol for back pain with relief.   Thoracic impedance normal.  Was given IV Lasix during HF clinic appt.    Prescribed:Torsemide20 mgTake 1 tablet (20 mg total) by mouth every morning AND 0.5 tablets (10 mg total) at bedtime.  Confirmed he is taking the prescribed dosage.   Labs: 07/01/2018 Creatinine 3.51, BUN 61, Potassium 4.5, Sodium 139, GFR 15-17 06/25/2018 Creatinine 2.89, BUN 60, Potassium 5.2, Sodium 140, GFR 19-22  06/18/2018 Creatinine 3.13, BUN 79, Potassium 4.9, Sodium 138, GFR 17-20  06/04/2018 Creatinine 3.30, BUN 74, Potassium 5.4, Sodium 136, GFR 16-18  A complete set of results can be found in Results Review.  Recommendations: No changes. He has been more restrictive with salt intake.Encouraged to call for fluid symptoms.  Follow-up plan: ICM clinic phone appointment on3/30/2020.Office appointment scheduled 07/16/2018 with HF Clinic NP/PA.   Copy of ICM check sent to Dr.Taylor.    3 month ICM trend: 07/14/2018    1 Year ICM trend:       Rosalene Billings, RN 07/15/2018 9:45 AM

## 2018-07-16 ENCOUNTER — Ambulatory Visit (HOSPITAL_COMMUNITY)
Admission: RE | Admit: 2018-07-16 | Discharge: 2018-07-16 | Disposition: A | Discharge status: Home / Self Care | Payer: Medicare Other | Source: Ambulatory Visit | Attending: Internal Medicine | Admitting: Internal Medicine

## 2018-07-16 ENCOUNTER — Encounter (HOSPITAL_COMMUNITY): Payer: Self-pay

## 2018-07-16 VITALS — BP 102/72 | HR 80 | Wt 180.6 lb

## 2018-07-16 DIAGNOSIS — N183 Chronic kidney disease, stage 3 unspecified: Secondary | ICD-10-CM

## 2018-07-16 DIAGNOSIS — Z7901 Long term (current) use of anticoagulants: Secondary | ICD-10-CM | POA: Diagnosis not present

## 2018-07-16 DIAGNOSIS — I255 Ischemic cardiomyopathy: Secondary | ICD-10-CM | POA: Diagnosis not present

## 2018-07-16 DIAGNOSIS — N184 Chronic kidney disease, stage 4 (severe): Secondary | ICD-10-CM | POA: Diagnosis not present

## 2018-07-16 DIAGNOSIS — Z79899 Other long term (current) drug therapy: Secondary | ICD-10-CM | POA: Diagnosis not present

## 2018-07-16 DIAGNOSIS — E785 Hyperlipidemia, unspecified: Secondary | ICD-10-CM | POA: Insufficient documentation

## 2018-07-16 DIAGNOSIS — I130 Hypertensive heart and chronic kidney disease with heart failure and stage 1 through stage 4 chronic kidney disease, or unspecified chronic kidney disease: Secondary | ICD-10-CM | POA: Insufficient documentation

## 2018-07-16 DIAGNOSIS — R2681 Unsteadiness on feet: Secondary | ICD-10-CM | POA: Insufficient documentation

## 2018-07-16 DIAGNOSIS — I495 Sick sinus syndrome: Secondary | ICD-10-CM | POA: Diagnosis not present

## 2018-07-16 DIAGNOSIS — I480 Paroxysmal atrial fibrillation: Secondary | ICD-10-CM | POA: Insufficient documentation

## 2018-07-16 DIAGNOSIS — I2510 Atherosclerotic heart disease of native coronary artery without angina pectoris: Secondary | ICD-10-CM | POA: Insufficient documentation

## 2018-07-16 DIAGNOSIS — E875 Hyperkalemia: Secondary | ICD-10-CM | POA: Insufficient documentation

## 2018-07-16 DIAGNOSIS — Z955 Presence of coronary angioplasty implant and graft: Secondary | ICD-10-CM | POA: Insufficient documentation

## 2018-07-16 DIAGNOSIS — I5022 Chronic systolic (congestive) heart failure: Secondary | ICD-10-CM | POA: Insufficient documentation

## 2018-07-16 DIAGNOSIS — Z951 Presence of aortocoronary bypass graft: Secondary | ICD-10-CM | POA: Diagnosis not present

## 2018-07-16 MED ORDER — TORSEMIDE 20 MG PO TABS: ORAL_TABLET | ORAL | 3 refills | Status: DC

## 2018-07-16 NOTE — Progress Notes (Signed)
Advanced Heart Failure Clinic Note   Patient ID: Marcus [name], male   DOB: 1928/05/28, 85 y.o.   MRN: [id number] EP: Dr Lovena Le  Cardiology: Aundra Dubin Nephrology: Dr Hollie Salk  85 y.o. with history of pacemaker for bradycardia, paroxysmal atrial fibrillation, CAD s/p CABG and PCIs, and ischemic cardiomyopathy EF 20-25%. Underwent CRT-D upgrade 10/08/13 due to 85% RV pacing.     Admitted 5/19-5/26/15 with volume overload. He was diuresed with milrinone and IV lasix and discharge weight was 153 lbs. He underwent upgrade of his ICD to CRT-D as well.  He was able to wean off milrinone and did much better after CRT upgrade.  He was admitted in 3/16 with pacemaker pocket infection.  CRT-D device was explanted and temporary-permanent pacemaker with placed.  He later had a St Jude CRT-P system re-implanted.  He was noted to be back in atrial fibrillation and had TEE-guided DCCV to NSR.  TEE showed EF 25-30%.    RHC in 10/17 showed near-normal filling pressures and low but not markedly low cardiac output.  Coreg was cut back to 6.25 mg bid.  Echo in 10/17 showed improvement in LV function, EF 35-40%.  Echo in 1/19 showed EF 35-40%, inferior/inferolateral akinesis.   Today he returns for HF follow up. Last visit he was instructed to increase torsemide to 30 mg per day. Overall feeling fine. Remains SOB with inclines. SOB carrying items. Denies PND/Orthopnea. No bleeding problems. Appetite ok. No fever or chills. Weight at home 173-174 pounds. Taking all medications.   Corvue: Impedance trending up.   Labs (3/16): K 4.1 => 4.6, creatinine 1.87 => 1.97, HCT 34.4, TSH normal, LFTs normal.  Labs (6/16): K 4.2, creatinine 2.34 Labs (10/16): K 4.8, creatinine 2.29 Labs (6/17): K 5.1, creatinine 1.89, AST 30, ALT 25, TSH normal, LDL 159, HCT 37 Labs (10/17): K 5, creatinine 2.45, TSH normal, hgb 12.7, BNP 453 Labs (11/17): LFTs normal Labs (12/17): hgb 11.7, creatinine 2.16 Labs (1/18): K 4.9, creatinine  2.54 Labs (7/18): K 4.3, creatinine 2.87, hgb 12.5 Labs (8/18): K 4.1, creatinine 2.31, LFTs normal, BNP 816, TSH normal Labs (9/18): K 4.4, creatinine 2.41, BNP 761, hgb 12.8 Labs (11/18): K 4.6, creatinine 2.56 Labs (1/19): hgb 13, K 4.7, creatinine 2.74 Labs (5/19): TSH normal, LFTs normal Labs (6/19): K 4.6, creatinine 3.28 Labs (8/19): K 4.5, creatinine 3.16 Labs (06/18/18): K 4.9 Creatinine 3.1  Labs 07/01/2018: K 4.5 Creatinine 3.5   PMH:  1. Symptomatic bradycardia: Medtronic CRT-D.  He developed pocket infection in 3/16 and had device extracted.  He had St Jude CRT-P system replaced.  2. HTN 3. Hyperlipidemia: Has refused statins.  4. Atrial fibrillation: History of prior DCCV.  Paroxysmal.  On Eliquis.  Has history of cardioembolism to right leg with right popliteal and tibial embolectomy (had been off Eliquis). Has had nausea with amiodarone in the past but now tolerating.  Recurrence in 3/16, had TEE-guided DCCV.  5. Prostate cancer 6. CAD: CABG remotely with SVG-OM and LIMA-LAD.  Late 1990s had PCI to RCA.  In 12/09 had LHC with total occlusion of SVG-OM, patent LIMA-LAD, DES to LCx and DES to RCA.   7. Ischemic cardiomyopathy: Echo (3/15) with EF 20-25%, wall motion abnormalities noted, mild to moderately decreased RV systolic function. ICD upgraded to [place] CRT-D 10/08/13.  TEE (3/16) with EF 25-30%, mildly dilated RV with mildly decreased systolic function, mild to moderate MR.  - RHC (10/17): mean RA 4, PA 44/21 mean 31, unable to wedge,  CI 2.0. - Echo (10/17): EF 35-40%, moderate dilation, inferior/inferolateral AK, moderate MR, RV mildly dilated with mildly decreased systolic function.  - Echo (1/19): EF 35-40%, inferior/inferolateral akinesis. 8. CKD stage IV  SH: Married, nonsmoker, lives in [place]  FH: CAD  Review of systems complete and found to be negative unless listed in HPI.   Current Outpatient Medications  Medication Sig Dispense Refill  . amiodarone  (PACERONE) 200 MG tablet TAKE 1/2 TABLET (100MG      TOTAL) DAILY 45 tablet 3  . apixaban (ELIQUIS) 2.5 MG TABS tablet Take 1 tablet (2.5 mg total) by mouth 2 (two) times daily. 180 tablet 2  . carvedilol (COREG) 6.25 MG tablet TAKE 1 TABLET BY MOUTH TWICE A DAY WITH MEALS 180 tablet 1  . Coenzyme Q10 (CO Q 10) 100 MG CAPS Take 1 capsule by mouth 2 (two) times daily.    Marland Kitchen torsemide (DEMADEX) 20 MG tablet Take 1 tablet (20 mg total) by mouth daily. And 1/2 tab (10 mg) in the evening. 45 tablet 3   No current facility-administered medications for this encounter.     Vitals:   07/16/18 1046  BP: 102/72  Pulse: 80  SpO2: 99%  Weight: 81.9 kg (180 lb 9.6 oz)   Wt Readings from Last 3 Encounters:  07/16/18 81.9 kg (180 lb 9.6 oz)  07/01/18 81.9 kg (180 lb 9.6 oz)  06/25/18 82.3 kg (181 lb 6.4 oz)    General: Elderly. Walked in the clinic. No resp difficulty HEENT: normal Neck: supple. JVP 7-8. Carotids 2+ bilat; no bruits. No lymphadenopathy or thryomegaly appreciated. Cor: PMI nondisplaced. Regular rate & rhythm. No rubs, gallops or murmurs. Lungs: clear Abdomen: soft, nontender, nondistended. No hepatosplenomegaly. No bruits or masses. Good bowel sounds. Extremities: no cyanosis, clubbing, rash, R and LLE 1+ dema Neuro: alert & orientedx3, cranial nerves grossly intact. moves all 4 extremities w/o difficulty. Affect pleasant   Assessment/Plan:  1. Chronic systolic CHF: Ischemic cardiomyopathy.  Echo in 1/19 showed EF 35-40% (stable from most recent prior).  Has St Jude CRT-P device.   - NYHA III. Functionally doing a little better. Volume status mildly elevated. Continue torsemide 20 mg in am and 10 mg in pm. Instructed to take an extra 10 mg Torsemide in the evening if weight is 176 or greater - Continue coreg 6.25 mg bid.   - Off spiro with increased creatinine and potassium. - Not on ACEI/ARB/ARNI with CKD. 2. Atrial fibrillation: Paroxysmal. -No A fib on interrogation.    Continue amiodarone 100 mg daily.   - Recent LFTs and TSH stable. Needs regular eye exam. - He is on Eliquis 2.5 mg bid (age, elevated creatinine).  3. CAD:  - No chest pain.  - Continue medical management.  - Refuses statin.  - He is not on ASA given use of Eliquis and stable CAD.  4. Sick sinus syndrome: s/p CRT-P. No change.  5. CKD, stage IV:  Obtain BMET from Dr Hollie Salk.  Renal US pending.   - Instructed to avoid NSAIDs.  .6. Hyperkalemia:  Resolved off potassium  7. Unsteady gait Doing ok walking today.   Follow up in 8 weeks with Dr Aundra Dubin.  Greater than 50% of the (total minutes 25) visit spent in counseling/coordination of care regarding sliding scale diuretic regimen.   Dalbert Stillings NP-C  07/16/2018

## 2018-07-16 NOTE — Patient Instructions (Signed)
Please take an additional 10mg  of Torsemide daily if weight is 176lbs or greater.  Follow up in 10 weeks

## 2018-07-18 ENCOUNTER — Ambulatory Visit
Admission: RE | Admit: 2018-07-18 | Discharge: 2018-07-18 | Disposition: A | Discharge status: Home / Self Care | Payer: Medicare Other | Source: Ambulatory Visit | Attending: Nephrology | Admitting: Nephrology

## 2018-07-18 DIAGNOSIS — D631 Anemia in chronic kidney disease: Secondary | ICD-10-CM

## 2018-07-18 DIAGNOSIS — N189 Chronic kidney disease, unspecified: Secondary | ICD-10-CM

## 2018-07-18 DIAGNOSIS — N184 Chronic kidney disease, stage 4 (severe): Secondary | ICD-10-CM

## 2018-08-04 ENCOUNTER — Other Ambulatory Visit: Payer: Self-pay

## 2018-08-04 ENCOUNTER — Ambulatory Visit (INDEPENDENT_AMBULATORY_CARE_PROVIDER_SITE_OTHER): Payer: Medicare Other | Admitting: *Deleted

## 2018-08-04 DIAGNOSIS — I442 Atrioventricular block, complete: Secondary | ICD-10-CM

## 2018-08-04 DIAGNOSIS — I495 Sick sinus syndrome: Secondary | ICD-10-CM

## 2018-08-05 ENCOUNTER — Telehealth: Payer: Self-pay

## 2018-08-05 NOTE — Telephone Encounter (Signed)
Left message for patient to remind of missed remote transmission.  

## 2018-08-06 LAB — CUP PACEART REMOTE DEVICE CHECK
Battery Remaining Longevity: 24 mo
Battery Voltage: 2.89 V
Brady Statistic AP VP Percent: 94 %
Brady Statistic AP VS Percent: 1 %
Brady Statistic RA Percent Paced: 94 %
Implantable Lead Implant Date: 20160304
Implantable Lead Implant Date: 20160304
Implantable Lead Location: 753858
Implantable Lead Location: 753860
Lead Channel Impedance Value: 440 Ohm
Lead Channel Impedance Value: 840 Ohm
Lead Channel Pacing Threshold Amplitude: 0.875 V
Lead Channel Pacing Threshold Amplitude: 1 V
Lead Channel Pacing Threshold Amplitude: 2.125 V
Lead Channel Pacing Threshold Pulse Width: 1 ms
Lead Channel Sensing Intrinsic Amplitude: 11.3 mV
Lead Channel Setting Pacing Amplitude: 2 V
Lead Channel Setting Pacing Pulse Width: 0.5 ms
Lead Channel Setting Pacing Pulse Width: 0.6 ms
Lead Channel Setting Sensing Sensitivity: 5 mV
MDC IDC LEAD IMPLANT DT: 20160304
MDC IDC LEAD LOCATION: 753859
MDC IDC MSMT BATTERY REMAINING PERCENTAGE: 56 %
MDC IDC MSMT LEADCHNL LV PACING THRESHOLD PULSEWIDTH: 0.6 ms
MDC IDC MSMT LEADCHNL RA SENSING INTR AMPL: 0.9 mV
MDC IDC MSMT LEADCHNL RV IMPEDANCE VALUE: 450 Ohm
MDC IDC MSMT LEADCHNL RV PACING THRESHOLD PULSEWIDTH: 0.5 ms
MDC IDC PG IMPLANT DT: 20160304
MDC IDC SESS DTM: 20200318012341
MDC IDC SET LEADCHNL RA PACING AMPLITUDE: 3.625
MDC IDC SET LEADCHNL RV PACING AMPLITUDE: 2 V
MDC IDC STAT BRADY AS VP PERCENT: 5.5 %
MDC IDC STAT BRADY AS VS PERCENT: 1 %
Pulse Gen Serial Number: 7725037

## 2018-08-11 NOTE — Progress Notes (Signed)
Remote pacemaker transmission.   

## 2018-08-18 ENCOUNTER — Other Ambulatory Visit: Payer: Self-pay

## 2018-08-18 ENCOUNTER — Ambulatory Visit (INDEPENDENT_AMBULATORY_CARE_PROVIDER_SITE_OTHER): Payer: Medicare Other

## 2018-08-18 DIAGNOSIS — I5022 Chronic systolic (congestive) heart failure: Secondary | ICD-10-CM | POA: Diagnosis not present

## 2018-08-18 DIAGNOSIS — Z950 Presence of cardiac pacemaker: Secondary | ICD-10-CM | POA: Diagnosis not present

## 2018-08-19 ENCOUNTER — Telehealth: Payer: Self-pay

## 2018-08-19 ENCOUNTER — Telehealth: Payer: Self-pay | Admitting: Internal Medicine

## 2018-08-19 MED ORDER — TORSEMIDE 20 MG PO TABS: ORAL_TABLET | ORAL | 6 refills | Status: DC

## 2018-08-19 NOTE — Telephone Encounter (Signed)
Left message for patient to remind of missed remote transmission.  

## 2018-08-19 NOTE — Progress Notes (Signed)
EPIC Encounter for ICM Monitoring  Patient Name: Marcus [name] is a 85 y.o. male Date: 08/19/2018 Primary Care Physican: Patient, No Pcp Per Primary Cardiologist:[place] Electrophysiologist: Lovena Le Bi-V Pacing: >99% Last Weight:172-175lbs  08/19/2018 Weight: 175 lbs   Spoke with wife.Pt spoke with Darrick Grinder, NP 08/19/2018 regarding SOB with exertion and weight gain of 1-2 pounds in the last couple of days.  Wife said they have had 2 fast food meals in the past few days. Reviewed nutrition of fast food places he ate in the last few days.  [place] meal was ~1400 mg of salt and [place] fried chicken biscuit ~1100 mg of salt.  Advised fast food meals are high in salt and encouraged to limit salt intake to 2000 mg daily and 64 oz fluid intake.     Thoracic impedance abnormal suggesting fluid accumulation since 08/15/2018 (correlates with eating food foods)      Prescribed:Torsemide20 mgTake 1 tablet (20 mg total) by mouth every morning AND 0.5 tablets (10 mg total) at bedtime.  Confirmed he is taking the prescribed dosage.   Labs: 07/01/2018 Creatinine 3.51, BUN 61, Potassium 4.5, Sodium 139, GFR 15-17 06/25/2018 Creatinine 2.89, BUN 60, Potassium 5.2, Sodium 140, GFR 19-22  06/18/2018 Creatinine 3.13, BUN 79, Potassium 4.9, Sodium 138, GFR 17-20  06/04/2018 Creatinine 3.30, BUN 74, Potassium 5.4, Sodium 136, GFR 16-18  A complete set of results can be found in Results Review.  Recommendations: Darrick Grinder, NP instructed patient to take 1 tablet of Torsemide bid x 2 days but wife said she does not know if he will take the extra because he thinks it is hard on the kidneys.  Follow-up plan: ICM clinic phone appointment on4/12/2018 to recheck fluid levels.  Copy of ICM check sent to Dr.Taylor and Darrick Grinder, NP for FYI since she spoke with patient today.  3 month ICM trend: 08/19/2018    1 Year ICM trend:       Rosalene Billings, RN 08/19/2018 4:10 PM

## 2018-08-19 NOTE — Telephone Encounter (Signed)
  I talked with his wife today due to increased SOB.   She reports that her husband has had increased SOB with exertion. He also has poor appetite. Able to take medications.   Weight at home 172-175 pounds.   I have recommended that he will continue torsemide 20 mg in the am and increase torsemide to 20 mg in the evening for 2 days. He will then go back to torsemide 20 mg/10 mg.   Marcus [name] is reluctant to increase torsemide but will discuss further with his wife.   Marcus [name] verbalized understanding and appreciated the call.   Amy Clegg NP-C  3:48 PM  .

## 2018-08-19 NOTE — Telephone Encounter (Signed)
Patient is closely followed by AHF clinic - will route to them for evaluation of shortness of breath.  Pamala Hurry, please help patient with missed remote  Thanks! Chanetta Marshall, NP 08/19/2018 3:35 PM

## 2018-08-19 NOTE — Telephone Encounter (Signed)
See if we can get a telehealth visit with him.

## 2018-08-19 NOTE — Telephone Encounter (Signed)
Wife of pt called. Initially she called because the pt missed a home remote transmission, but she also stated that the pt is having SOB after minor activities like getting dressed. He doesn't do much more than sit in the chair most of the day, and when he naps, his wife says sometimes his breathing is labored then. Wife wants to know what she should do.

## 2018-08-21 NOTE — Telephone Encounter (Signed)
Pt sch for telehealth visit w/DM 4/3

## 2018-08-22 ENCOUNTER — Other Ambulatory Visit: Payer: Self-pay

## 2018-08-22 ENCOUNTER — Telehealth (HOSPITAL_COMMUNITY): Payer: Self-pay

## 2018-08-22 ENCOUNTER — Ambulatory Visit (HOSPITAL_COMMUNITY)
Admission: RE | Admit: 2018-08-22 | Discharge: 2018-08-22 | Disposition: A | Discharge status: Home / Self Care | Payer: Medicare Other | Source: Ambulatory Visit | Attending: Cardiology | Admitting: Cardiology

## 2018-08-22 DIAGNOSIS — I480 Paroxysmal atrial fibrillation: Secondary | ICD-10-CM | POA: Diagnosis not present

## 2018-08-22 DIAGNOSIS — I5022 Chronic systolic (congestive) heart failure: Secondary | ICD-10-CM

## 2018-08-22 DIAGNOSIS — N184 Chronic kidney disease, stage 4 (severe): Secondary | ICD-10-CM | POA: Diagnosis not present

## 2018-08-22 MED ORDER — TORSEMIDE 20 MG PO TABS: 40.0000 mg | ORAL_TABLET | Freq: Every day | ORAL | 3 refills | Status: DC

## 2018-08-22 NOTE — Telephone Encounter (Signed)
Increase Torsemide to 40 mg daily  [place] to draw labs TSH CMET & CBC  Telehealth visit 4/10@ @220 

## 2018-08-22 NOTE — Patient Instructions (Signed)
Increase Torsemide to 40 mg daily  [place] to draw labs TSH CMET & CBC  Telehealth visit 4/10@ @220 

## 2018-08-24 NOTE — Progress Notes (Signed)
Heart Failure TeleHealth Note  Due to national recommendations of social distancing due to [place] 19, Audio/video telehealth visit is felt to be most appropriate for this patient at this time.  See MyChart message from today for patient consent regarding telehealth for [facility].  Date:  08/24/2018   ID:  Marcus [name], DOB [birth year], MRN [id number]  Location: Home  Provider location: [address] [place] Type of Visit: Established patient  PCP:  Patient, No Pcp Per  Cardiologist:  Dr. Aundra Dubin  Chief Complaint: Exertional shortness of breath.  History of Present Illness: Marcus [name] is a 85 y.o. male who presents via audio/video conferencing for a telehealth visit today.     Today,  he denies symptoms of cough, fevers, chills, or new SOB worrisome for COVID 19.    Patient has history of pacemaker for bradycardia, paroxysmal atrial fibrillation, CAD s/p CABG and PCIs, and ischemic cardiomyopathy EF 20-25%. Underwent CRT-D upgrade 10/08/13 due to 85% RV pacing.     Admitted 5/19-5/26/15 with volume overload. He was diuresed with milrinone and IV lasix and discharge weight was 153 lbs. He underwent upgrade of his ICD to CRT-D as well.  He was able to wean off milrinone and did much better after CRT upgrade.  He was admitted in 3/16 with pacemaker pocket infection.  CRT-D device was explanted and temporary-permanent pacemaker with placed.  He later had a St Jude CRT-P system re-implanted.  He was noted to be back in atrial fibrillation and had TEE-guided DCCV to NSR.  TEE showed EF 25-30%.    RHC in 10/17 showed near-normal filling pressures and low but not markedly low cardiac output.  Coreg was cut back to 6.25 mg bid.  Echo in 10/17 showed improvement in LV function, EF 35-40%.  Echo in 1/19 showed EF 35-40%, inferior/inferolateral akinesis.   Patient has been taking torsemide 20 qam/10 qpm.  Weight is stable but he is still short of breath with most  exertion, just walking around the house.  It has been this bad for about a month.  No orthopnea/PND.  No chest pain, no lightheadedness.  He had been eating a lot of take-out food since shelter-in-place was begun and has had a lot of sodium in his diet.  He has been resistant to increasing torsemide.   St Jude device interrogation: thoracic impedance decreased recently, suggestive of volume overload. >99% BiV pacing, <1% atrial fibrillation.   Labs (3/16): K 4.1 => 4.6, creatinine 1.87 => 1.97, HCT 34.4, TSH normal, LFTs normal.  Labs (6/16): K 4.2, creatinine 2.34 Labs (10/16): K 4.8, creatinine 2.29 Labs (6/17): K 5.1, creatinine 1.89, AST 30, ALT 25, TSH normal, LDL 159, HCT 37 Labs (10/17): K 5, creatinine 2.45, TSH normal, hgb 12.7, BNP 453 Labs (11/17): LFTs normal Labs (12/17): hgb 11.7, creatinine 2.16 Labs (1/18): K 4.9, creatinine 2.54 Labs (7/18): K 4.3, creatinine 2.87, hgb 12.5 Labs (8/18): K 4.1, creatinine 2.31, LFTs normal, BNP 816, TSH normal Labs (9/18): K 4.4, creatinine 2.41, BNP 761, hgb 12.8 Labs (11/18): K 4.6, creatinine 2.56 Labs (1/19): hgb 13, K 4.7, creatinine 2.74 Labs (5/19): TSH normal, LFTs normal Labs (6/19): K 4.6, creatinine 3.28 Labs (8/19): K 4.5, creatinine 3.16 Labs (1/20): K 4.9 Creatinine 3.1, TSH normal, LFTs normal Labs (2/20): K 4.5, creatinine 3.5  PMH:  1. Symptomatic bradycardia: Medtronic CRT-D.  He developed pocket infection in 3/16 and had device extracted.  He had St Jude CRT-P system  replaced.  2. HTN 3. Hyperlipidemia: Has refused statins.  4. Atrial fibrillation: History of prior DCCV.  Paroxysmal.  On Eliquis.  Has history of cardioembolism to right leg with right popliteal and tibial embolectomy (had been off Eliquis). Has had nausea with amiodarone in the past but now tolerating.  Recurrence in 3/16, had TEE-guided DCCV.  5. Prostate cancer 6. CAD: CABG remotely with SVG-OM and LIMA-LAD.  Late 1990s had PCI to RCA.  In 12/09 had  LHC with total occlusion of SVG-OM, patent LIMA-LAD, DES to LCx and DES to RCA.   7. Ischemic cardiomyopathy: Echo (3/15) with EF 20-25%, wall motion abnormalities noted, mild to moderately decreased RV systolic function. ICD upgraded to [place] CRT-D 10/08/13.  TEE (3/16) with EF 25-30%, mildly dilated RV with mildly decreased systolic function, mild to moderate MR.  - RHC (10/17): mean RA 4, PA 44/21 mean 31, unable to wedge, CI 2.0. - Echo (10/17): EF 35-40%, moderate dilation, inferior/inferolateral AK, moderate MR, RV mildly dilated with mildly decreased systolic function.  - Echo (1/19): EF 35-40%, inferior/inferolateral akinesis. 8. CKD stage IV  Current Outpatient Medications  Medication Sig Dispense Refill  . amiodarone (PACERONE) 200 MG tablet TAKE 1/2 TABLET (100MG      TOTAL) DAILY 45 tablet 3  . apixaban (ELIQUIS) 2.5 MG TABS tablet Take 1 tablet (2.5 mg total) by mouth 2 (two) times daily. 180 tablet 2  . carvedilol (COREG) 6.25 MG tablet TAKE 1 TABLET BY MOUTH TWICE A DAY WITH MEALS 180 tablet 1  . Coenzyme Q10 (CO Q 10) 100 MG CAPS Take 1 capsule by mouth 2 (two) times daily.    Marland Kitchen torsemide (DEMADEX) 20 MG tablet Take 2 tablets (40 mg total) by mouth daily. 180 tablet 3   No current facility-administered medications for this encounter.     Allergies:   Patient has no known allergies.   Social History:  The patient  reports that he quit smoking about 47 years ago. He has never used smokeless tobacco. He reports that he does not drink alcohol or use drugs.   Family History:  The patient's family history includes Alcohol abuse in his father; Cancer in his mother; Colon cancer in his mother; Hypertension in his mother.   ROS:  Please see the history of present illness.   All other systems are personally reviewed and negative.   Exam:  (Video/Tele Health Call; Exam is subjective and or/visual.) BP 118/62, HR 71 (obtained by patient) General:  Well appearing. No resp difficulty.  HEENT: Normal Neck: JVP 12-14 cm Lungs: Normal respiratory effort with conversation.  Abdomen: Non-distended. Pt denies tenderness with self palpation.  Extremities: 1+ edema 1/3 to knees bilaterally Neuro: Alert & oriented x 3.   Recent Labs: 06/04/2018: ALT 10; Hemoglobin 10.5; Platelets 173; TSH 1.572 06/25/2018: B Natriuretic Peptide 800.3 07/01/2018: BUN 61; Creatinine, Ser 3.51; Potassium 4.5; Sodium 139  Personally reviewed   Wt Readings from Last 3 Encounters:  07/16/18 81.9 kg (180 lb 9.6 oz)  07/01/18 81.9 kg (180 lb 9.6 oz)  06/25/18 82.3 kg (181 lb 6.4 oz)     ASSESSMENT AND PLAN:  1. Chronic systolic CHF: Ischemic cardiomyopathy.  Echo in 1/19 showed EF 35-40% (stable from most recent prior).  Has St Jude CRT-P device.  On device check on 3/18, he was appropriately BiV pacing with extremely rare AF, thoracic impedance was low.  On exam, he is volume overloaded (can see JVD and edema on video).  NYHA class IIIb  symptoms.  He has been following a high sodium diet.  Volume retention from CHF is compounded by CKD stage IV.  - He agrees to increase torsemide to 40 mg daily.  Will need BMET next week. - Continue coreg 6.25 mg bid.   - Off spiro with increased creatinine and potassium. - Not on ACEI/ARB/ARNI with CKD. - He will need to cut back considerably on sodium in diet, discussed with both him and his wife.  2. Atrial fibrillation: Paroxysmal. <1% atrial fibrillation on recent device check.  - Continue amiodarone 100 mg daily.  Check LFTs and TSH with next labs. Needs regular eye exam. - He is on Eliquis 2.5 mg bid (age, elevated creatinine).  3. CAD: No chest pain. Continue medical management.  - Refuses statin.  - He is not on ASA given use of Eliquis and stable CAD.  4. Sick sinus syndrome: s/p CRT-P. No change.  5. CKD, stage IV: BMET Next week. Sees nephrology.  6. Hyperkalemia: Resolved on last BMET.   COVID screen The patient does not have any symptoms that  suggest any further testing/ screening at this time.  Social distancing reinforced today.  Recommended follow-up:  2 wks via telehealth  Relevant cardiac medications were reviewed at length with the patient today.   The patient does not have concerns regarding their medications at this time.   Patient Risk: After full review of this patients clinical status, I feel that they are at moderate risk for cardiac decompensation at this time.  Today, I have spent 21 minutes with the patient with telehealth technology discussing CHF.    Signed, [name], MD  08/24/2018  [facility] [address] [place] and [place] [address] [phone] (office) [phone] (fax)

## 2018-08-27 ENCOUNTER — Ambulatory Visit (INDEPENDENT_AMBULATORY_CARE_PROVIDER_SITE_OTHER): Payer: Medicare Other

## 2018-08-27 ENCOUNTER — Other Ambulatory Visit: Payer: Self-pay

## 2018-08-27 ENCOUNTER — Other Ambulatory Visit (HOSPITAL_COMMUNITY): Payer: Self-pay | Admitting: Cardiology

## 2018-08-27 DIAGNOSIS — I5022 Chronic systolic (congestive) heart failure: Secondary | ICD-10-CM

## 2018-08-27 DIAGNOSIS — Z950 Presence of cardiac pacemaker: Secondary | ICD-10-CM

## 2018-08-27 DIAGNOSIS — I5023 Acute on chronic systolic (congestive) heart failure: Secondary | ICD-10-CM

## 2018-08-27 DIAGNOSIS — I480 Paroxysmal atrial fibrillation: Secondary | ICD-10-CM

## 2018-08-29 ENCOUNTER — Ambulatory Visit (HOSPITAL_COMMUNITY)
Admission: RE | Admit: 2018-08-29 | Discharge: 2018-08-29 | Disposition: A | Discharge status: Home / Self Care | Payer: Medicare Other | Source: Ambulatory Visit | Attending: Cardiology | Admitting: Cardiology

## 2018-08-29 ENCOUNTER — Other Ambulatory Visit: Payer: Self-pay

## 2018-08-29 DIAGNOSIS — I5022 Chronic systolic (congestive) heart failure: Secondary | ICD-10-CM | POA: Diagnosis not present

## 2018-08-29 DIAGNOSIS — I5023 Acute on chronic systolic (congestive) heart failure: Secondary | ICD-10-CM

## 2018-08-29 DIAGNOSIS — I480 Paroxysmal atrial fibrillation: Secondary | ICD-10-CM

## 2018-08-29 NOTE — Progress Notes (Signed)
EPIC Encounter for ICM Monitoring  Patient Name: DERYK BOZMAN is a 85 y.o. male Date: 08/29/2018 Primary Care Physican: Patient, No Pcp Per Primary Cardiologist:[place] Electrophysiologist: Lovena Le Bi-V Pacing: >99% LastWeight:172-175lbs  08/19/2018 Weight: 175 lbs   Transmission reviewed     Thoracic impedance returned to normal after he pt was instructed by Darrick Grinder, NP instructed patient to take 1 tablet of Torsemide bid x 2 days on 3/30 due to weight gain.   Prescribed:Torsemide20 mgTake 1 tablet (20 mg total) by mouth every morning AND 0.5 tablets (10 mg total) at bedtime.  Labs: 07/01/2018 Creatinine3.51, BUN61, Potassium4.5, Sodium139, [id number] 06/25/2018 Creatinine2.89, BUN60, Potassium5.2, Sodium140, KQA06-01  06/18/2018 Creatinine3.13, BUN79, Potassium4.9, Sodium138, [id number]  06/04/2018 Creatinine3.30, BUN74, Potassium5.4, [id number], [id number] A complete set of results can be found in Results Review.  Recommendations: None - patient has virtual office visit today.   Follow-up plan: ICM clinic phone appointment on5/08/2018.  Copy of ICM check sent to Dr.Taylor and Dr Aundra Dubin since he has a virtual office visit with patient today, 08/29/2018.    3 month ICM trend: 08/27/2018    1 Year ICM trend:       Rosalene Billings, RN 08/29/2018 1:48 PM

## 2018-08-29 NOTE — Patient Instructions (Signed)
Please follow up with Dr. Aundra Dubin in 2 months.

## 2018-08-29 NOTE — Progress Notes (Signed)
Spoke to pt and male family member about after visit summary. Scheduled 2 month in office follow up. Family denies needs for refills. No further questions.

## 2018-08-29 NOTE — Progress Notes (Signed)
Heart Failure TeleHealth Note  Due to national recommendations of social distancing due to [place] 19, Audio/video telehealth visit is felt to be most appropriate for this patient at this time.  See MyChart message from today for patient consent regarding telehealth for [facility].  Date:  08/29/2018   ID:  Marcus [name], DOB 13-Feb-1929, MRN [id number]  Location: Home  Provider location: [address], [place] Type of Visit: Established patient  PCP:  Patient, No Pcp Per  Cardiologist:  Dr. Aundra Dubin  Chief Complaint: Exertional shortness of breath.  History of Present Illness: Marcus [name] is a 85 y.o. male who presents via audio/video conferencing for a telehealth visit today.     Today,  he denies symptoms of cough, fevers, chills, or new SOB worrisome for COVID 19.    Patient has history of pacemaker for bradycardia, paroxysmal atrial fibrillation, CAD s/p CABG and PCIs, and ischemic cardiomyopathy EF 20-25%. Underwent CRT-D upgrade 10/08/13 due to 85% RV pacing.     Admitted 5/19-5/26/15 with volume overload. He was diuresed with milrinone and IV lasix and discharge weight was 153 lbs. He underwent upgrade of his ICD to CRT-D as well.  He was able to wean off milrinone and did much better after CRT upgrade.  He was admitted in 3/16 with pacemaker pocket infection.  CRT-D device was explanted and temporary-permanent pacemaker with placed.  He later had a St Jude CRT-P system re-implanted.  He was noted to be back in atrial fibrillation and had TEE-guided DCCV to NSR.  TEE showed EF 25-30%.    RHC in 10/17 showed near-normal filling pressures and low but not markedly low cardiac output.  Coreg was cut back to 6.25 mg bid.  Echo in 10/17 showed improvement in LV function, EF 35-40%.  Echo in 1/19 showed EF 35-40%, inferior/inferolateral akinesis.   At last appointment, he looked volume overloaded by video-exam, weight was up, and thoracic impedance was down.   He agreed to increase torsemide to 40 mg daily and has been taking that dose since.  He feels better and has less peripheral edema.  Weight is down about 2 lbs.  He can walk to the mailbox without dyspnea.  No orthopnea/PND.  No chest pain.  No lightheadedness. Labs were drawn earlier this week but not available yet.   St Jude device interrogation today: thoracic impedance increasing back towards his baseline. >99% BiV pacing, <1% atrial fibrillation.   Labs (3/16): K 4.1 => 4.6, creatinine 1.87 => 1.97, HCT 34.4, TSH normal, LFTs normal.  Labs (6/16): K 4.2, creatinine 2.34 Labs (10/16): K 4.8, creatinine 2.29 Labs (6/17): K 5.1, creatinine 1.89, AST 30, ALT 25, TSH normal, LDL 159, HCT 37 Labs (10/17): K 5, creatinine 2.45, TSH normal, hgb 12.7, BNP 453 Labs (11/17): LFTs normal Labs (12/17): hgb 11.7, creatinine 2.16 Labs (1/18): K 4.9, creatinine 2.54 Labs (7/18): K 4.3, creatinine 2.87, hgb 12.5 Labs (8/18): K 4.1, creatinine 2.31, LFTs normal, BNP 816, TSH normal Labs (9/18): K 4.4, creatinine 2.41, BNP 761, hgb 12.8 Labs (11/18): K 4.6, creatinine 2.56 Labs (1/19): hgb 13, K 4.7, creatinine 2.74 Labs (5/19): TSH normal, LFTs normal Labs (6/19): K 4.6, creatinine 3.28 Labs (8/19): K 4.5, creatinine 3.16 Labs (1/20): K 4.9 Creatinine 3.1, TSH normal, LFTs normal Labs (2/20): K 4.5, creatinine 3.5  PMH:  1. Symptomatic bradycardia: Medtronic CRT-D.  He developed pocket infection in 3/16 and had device extracted.  He had St Jude CRT-P  system replaced.  2. HTN 3. Hyperlipidemia: Has refused statins.  4. Atrial fibrillation: History of prior DCCV.  Paroxysmal.  On Eliquis.  Has history of cardioembolism to right leg with right popliteal and tibial embolectomy (had been off Eliquis). Has had nausea with amiodarone in the past but now tolerating.  Recurrence in 3/16, had TEE-guided DCCV.  5. Prostate cancer 6. CAD: CABG remotely with SVG-OM and LIMA-LAD.  Late 1990s had PCI to RCA.  In  12/09 had LHC with total occlusion of SVG-OM, patent LIMA-LAD, DES to LCx and DES to RCA.   7. Ischemic cardiomyopathy: Echo (3/15) with EF 20-25%, wall motion abnormalities noted, mild to moderately decreased RV systolic function. ICD upgraded to [place] CRT-D 10/08/13.  TEE (3/16) with EF 25-30%, mildly dilated RV with mildly decreased systolic function, mild to moderate MR.  - RHC (10/17): mean RA 4, PA 44/21 mean 31, unable to wedge, CI 2.0. - Echo (10/17): EF 35-40%, moderate dilation, inferior/inferolateral AK, moderate MR, RV mildly dilated with mildly decreased systolic function.  - Echo (1/19): EF 35-40%, inferior/inferolateral akinesis. 8. CKD stage IV  Current Outpatient Medications  Medication Sig Dispense Refill  . amiodarone (PACERONE) 200 MG tablet TAKE 1/2 TABLET (100MG      TOTAL) DAILY 45 tablet 3  . carvedilol (COREG) 6.25 MG tablet TAKE 1 TABLET BY MOUTH TWICE A DAY WITH MEALS 180 tablet 1  . Coenzyme Q10 (CO Q 10) 100 MG CAPS Take 1 capsule by mouth 2 (two) times daily.    Marland Kitchen ELIQUIS 2.5 MG TABS tablet TAKE 1 TABLET TWICE A DAY 180 tablet 3  . torsemide (DEMADEX) 20 MG tablet Take 2 tablets (40 mg total) by mouth daily. 180 tablet 3   No current facility-administered medications for this encounter.     Allergies:   Patient has no known allergies.   Social History:  The patient  reports that he quit smoking about 47 years ago. He has never used smokeless tobacco. He reports that he does not drink alcohol or use drugs.   Family History:  The patient's family history includes Alcohol abuse in his father; Cancer in his mother; Colon cancer in his mother; Hypertension in his mother.   ROS:  Please see the history of present illness.   All other systems are personally reviewed and negative.   Exam:  (Video/Tele Health Call; Exam is subjective and or/visual.) General:  Well appearing. No resp difficulty. HEENT: Normal Neck: JVP  7-8 cm Lungs: Normal respiratory effort with  conversation.  Abdomen: Non-distended. Pt denies tenderness with self palpation.  Extremities:  Trace ankle edema.  Neuro: Alert & oriented x 3.   Recent Labs: 06/04/2018: ALT 10; Hemoglobin 10.5; Platelets 173; TSH 1.572 06/25/2018: B Natriuretic Peptide 800.3 07/01/2018: BUN 61; Creatinine, Ser 3.51; Potassium 4.5; Sodium 139  Personally reviewed   Wt Readings from Last 3 Encounters:  07/16/18 81.9 kg (180 lb 9.6 oz)  07/01/18 81.9 kg (180 lb 9.6 oz)  06/25/18 82.3 kg (181 lb 6.4 oz)     ASSESSMENT AND PLAN:  1. Chronic systolic CHF: Ischemic cardiomyopathy.  Echo in 1/19 showed EF 35-40% (stable from most recent prior).  Has St Jude CRT-P device.  On device check on 08/06/18, he was appropriately BiV pacing with extremely rare AF, thoracic impedance was low.  Volume retention from CHF is compounded by CKD stage IV.  Torsemide was increased to 40 mg daily and weight is down 2 lbs.  Device was checked again, now  thoracic impedance trending up towards baseline.  Volume status looks better on exam. Breathing better overall.  - Continue torsemide 40 mg daily.  BMET was done but do not have results yet. - Continue coreg 6.25 mg bid.   - Off spiro with increased creatinine and potassium. - Not on ACEI/ARB/ARNI with CKD. - He will need to cut back considerably on sodium in diet, discussed with both him and his wife.  2. Atrial fibrillation: Paroxysmal. <1% atrial fibrillation on recent device check.  - Continue amiodarone 100 mg daily.  TSH and LFTs checked with labs this week, pending results. Needs regular eye exam. - He is on Eliquis 2.5 mg bid (age, elevated creatinine).  3. CAD: No chest pain. Continue medical management.  - Refuses statin.  - He is not on ASA given use of Eliquis and stable CAD.  4. Sick sinus syndrome: s/p CRT-P. No change.  5. CKD, stage IV: Pending BMET. Sees nephrology.  6. Hyperkalemia: Resolved on last BMET.   COVID screen The patient does not have any symptoms  that suggest any further testing/ screening at this time.  Social distancing reinforced today.  Recommended follow-up:  2 months in the office.   Relevant cardiac medications were reviewed at length with the patient today.   The patient does not have concerns regarding their medications at this time.   Patient Risk: After full review of this patients clinical status, I feel that they are at moderate risk for cardiac decompensation at this time.  Today, I have spent 21 minutes with the patient with telehealth technology discussing CHF.    Signed, [name], MD  08/29/2018  [facility] [address] Heart and [place] [address] [phone] (office) [phone] (fax)

## 2018-09-09 ENCOUNTER — Other Ambulatory Visit (HOSPITAL_COMMUNITY): Payer: Self-pay | Admitting: Cardiology

## 2018-09-22 ENCOUNTER — Encounter (HOSPITAL_COMMUNITY): Payer: Self-pay | Admitting: Emergency Medicine

## 2018-09-22 ENCOUNTER — Emergency Department (HOSPITAL_COMMUNITY): Payer: Medicare Other

## 2018-09-22 ENCOUNTER — Telehealth (HOSPITAL_COMMUNITY): Payer: Self-pay | Admitting: *Deleted

## 2018-09-22 ENCOUNTER — Telehealth: Payer: Self-pay | Admitting: Cardiology

## 2018-09-22 ENCOUNTER — Inpatient Hospital Stay (HOSPITAL_COMMUNITY)
Admission: EM | Admit: 2018-09-22 | Discharge: 2018-09-26 | DRG: 193 | Disposition: A | Discharge status: Home Health | Payer: Medicare Other | Attending: Internal Medicine | Admitting: Internal Medicine

## 2018-09-22 ENCOUNTER — Other Ambulatory Visit: Payer: Self-pay

## 2018-09-22 DIAGNOSIS — Z951 Presence of aortocoronary bypass graft: Secondary | ICD-10-CM

## 2018-09-22 DIAGNOSIS — R531 Weakness: Secondary | ICD-10-CM | POA: Diagnosis not present

## 2018-09-22 DIAGNOSIS — I5023 Acute on chronic systolic (congestive) heart failure: Secondary | ICD-10-CM | POA: Diagnosis present

## 2018-09-22 DIAGNOSIS — D649 Anemia, unspecified: Secondary | ICD-10-CM | POA: Diagnosis present

## 2018-09-22 DIAGNOSIS — Z7901 Long term (current) use of anticoagulants: Secondary | ICD-10-CM

## 2018-09-22 DIAGNOSIS — J189 Pneumonia, unspecified organism: Secondary | ICD-10-CM | POA: Diagnosis not present

## 2018-09-22 DIAGNOSIS — G9340 Encephalopathy, unspecified: Secondary | ICD-10-CM

## 2018-09-22 DIAGNOSIS — R7989 Other specified abnormal findings of blood chemistry: Secondary | ICD-10-CM

## 2018-09-22 DIAGNOSIS — E872 Acidosis: Secondary | ICD-10-CM | POA: Diagnosis present

## 2018-09-22 DIAGNOSIS — N184 Chronic kidney disease, stage 4 (severe): Secondary | ICD-10-CM

## 2018-09-22 DIAGNOSIS — I130 Hypertensive heart and chronic kidney disease with heart failure and stage 1 through stage 4 chronic kidney disease, or unspecified chronic kidney disease: Secondary | ICD-10-CM | POA: Diagnosis present

## 2018-09-22 DIAGNOSIS — I5022 Chronic systolic (congestive) heart failure: Secondary | ICD-10-CM | POA: Diagnosis present

## 2018-09-22 DIAGNOSIS — Z20828 Contact with and (suspected) exposure to other viral communicable diseases: Secondary | ICD-10-CM | POA: Diagnosis present

## 2018-09-22 DIAGNOSIS — I2510 Atherosclerotic heart disease of native coronary artery without angina pectoris: Secondary | ICD-10-CM | POA: Diagnosis present

## 2018-09-22 DIAGNOSIS — Z86718 Personal history of other venous thrombosis and embolism: Secondary | ICD-10-CM

## 2018-09-22 DIAGNOSIS — Z8546 Personal history of malignant neoplasm of prostate: Secondary | ICD-10-CM

## 2018-09-22 DIAGNOSIS — N179 Acute kidney failure, unspecified: Secondary | ICD-10-CM

## 2018-09-22 DIAGNOSIS — R0600 Dyspnea, unspecified: Secondary | ICD-10-CM

## 2018-09-22 DIAGNOSIS — I248 Other forms of acute ischemic heart disease: Secondary | ICD-10-CM | POA: Diagnosis present

## 2018-09-22 DIAGNOSIS — I739 Peripheral vascular disease, unspecified: Secondary | ICD-10-CM | POA: Diagnosis present

## 2018-09-22 DIAGNOSIS — Z66 Do not resuscitate: Secondary | ICD-10-CM | POA: Diagnosis present

## 2018-09-22 DIAGNOSIS — B9689 Other specified bacterial agents as the cause of diseases classified elsewhere: Secondary | ICD-10-CM | POA: Diagnosis present

## 2018-09-22 DIAGNOSIS — R7881 Bacteremia: Secondary | ICD-10-CM

## 2018-09-22 DIAGNOSIS — Z9861 Coronary angioplasty status: Secondary | ICD-10-CM

## 2018-09-22 DIAGNOSIS — H9190 Unspecified hearing loss, unspecified ear: Secondary | ICD-10-CM | POA: Diagnosis present

## 2018-09-22 DIAGNOSIS — E785 Hyperlipidemia, unspecified: Secondary | ICD-10-CM | POA: Diagnosis present

## 2018-09-22 DIAGNOSIS — I255 Ischemic cardiomyopathy: Secondary | ICD-10-CM | POA: Diagnosis present

## 2018-09-22 DIAGNOSIS — I509 Heart failure, unspecified: Secondary | ICD-10-CM

## 2018-09-22 DIAGNOSIS — R778 Other specified abnormalities of plasma proteins: Secondary | ICD-10-CM

## 2018-09-22 DIAGNOSIS — Z87891 Personal history of nicotine dependence: Secondary | ICD-10-CM

## 2018-09-22 DIAGNOSIS — E876 Hypokalemia: Secondary | ICD-10-CM | POA: Diagnosis present

## 2018-09-22 DIAGNOSIS — I959 Hypotension, unspecified: Secondary | ICD-10-CM | POA: Diagnosis present

## 2018-09-22 DIAGNOSIS — I480 Paroxysmal atrial fibrillation: Secondary | ICD-10-CM | POA: Diagnosis present

## 2018-09-22 DIAGNOSIS — Z95828 Presence of other vascular implants and grafts: Secondary | ICD-10-CM

## 2018-09-22 DIAGNOSIS — Z79899 Other long term (current) drug therapy: Secondary | ICD-10-CM

## 2018-09-22 LAB — CBC
HCT: 34.7 % — ABNORMAL LOW (ref 39.0–52.0)
Hemoglobin: 10.7 g/dL — ABNORMAL LOW (ref 13.0–17.0)
MCH: 29.8 pg (ref 26.0–34.0)
MCHC: 30.8 g/dL (ref 30.0–36.0)
MCV: 96.7 fL (ref 80.0–100.0)
Platelets: 162 10*3/uL (ref 150–400)
RBC: 3.59 MIL/uL — ABNORMAL LOW (ref 4.22–5.81)
RDW: 14.7 % (ref 11.5–15.5)
WBC: 9.7 10*3/uL (ref 4.0–10.5)
nRBC: 0 % (ref 0.0–0.2)

## 2018-09-22 LAB — TROPONIN I: Troponin I: 0.08 ng/mL (ref <0.03)

## 2018-09-22 LAB — COMPREHENSIVE METABOLIC PANEL
ALT: 13 U/L (ref 0–44)
AST: 20 U/L (ref 15–41)
Albumin: 3.5 g/dL (ref 3.5–5.0)
Alkaline Phosphatase: 66 U/L (ref 38–126)
Anion gap: 20 — ABNORMAL HIGH (ref 5–15)
BUN: 91 mg/dL — ABNORMAL HIGH (ref 8–23)
CO2: 16 mmol/L — ABNORMAL LOW (ref 22–32)
Calcium: 9.3 mg/dL (ref 8.9–10.3)
Chloride: 101 mmol/L (ref 98–111)
Creatinine, Ser: 3.77 mg/dL — ABNORMAL HIGH (ref 0.61–1.24)
GFR calc Af Amer: 16 mL/min — ABNORMAL LOW (ref >60)
GFR calc non Af Amer: 14 mL/min — ABNORMAL LOW (ref >60)
Glucose, Bld: 119 mg/dL — ABNORMAL HIGH (ref 70–99)
Potassium: 3.8 mmol/L (ref 3.5–5.1)
Sodium: 137 mmol/L (ref 135–145)
Total Bilirubin: 1.1 mg/dL (ref 0.3–1.2)
Total Protein: 7.9 g/dL (ref 6.5–8.1)

## 2018-09-22 LAB — URINALYSIS, ROUTINE W REFLEX MICROSCOPIC
Bilirubin Urine: NEGATIVE
Glucose, UA: NEGATIVE mg/dL
Hgb urine dipstick: NEGATIVE
Ketones, ur: NEGATIVE mg/dL
Leukocytes,Ua: NEGATIVE
Nitrite: NEGATIVE
Protein, ur: NEGATIVE mg/dL
Specific Gravity, Urine: 1.011 (ref 1.005–1.030)
pH: 5 (ref 5.0–8.0)

## 2018-09-22 LAB — SARS CORONAVIRUS 2 BY RT PCR (HOSPITAL ORDER, PERFORMED IN ~~LOC~~ HOSPITAL LAB): SARS Coronavirus 2: NEGATIVE

## 2018-09-22 LAB — LACTIC ACID, PLASMA: Lactic Acid, Venous: 1.7 mmol/L (ref 0.5–1.9)

## 2018-09-22 LAB — BRAIN NATRIURETIC PEPTIDE: B Natriuretic Peptide: 1259.5 pg/mL — ABNORMAL HIGH (ref 0.0–100.0)

## 2018-09-22 MED ORDER — SODIUM CHLORIDE 0.9 % IV BOLUS
500.0000 mL | Freq: Once | INTRAVENOUS | Status: AC
  Administered 2018-09-22: 500 mL via INTRAVENOUS

## 2018-09-22 NOTE — ED Notes (Signed)
EDP aware of pts BP

## 2018-09-22 NOTE — ED Notes (Signed)
repaged [facility] to Dr Alvino [facility]

## 2018-09-22 NOTE — ED Notes (Signed)
Patient transported to CT 

## 2018-09-22 NOTE — Telephone Encounter (Signed)
     Patients wife left VM stating pt has declined since Friday. She said he is more confused and seems weaker than normal. Patient cant figure out how to put on clothes, forgets whole conversations, forgets how to get in the bed. All of this just started Friday. Patient does not have a PCP. Patients home health nurse will be out to evaluate him this afternoon.Patients wife requests a call directly from [facility].

## 2018-09-22 NOTE — ED Notes (Addendum)
Pt's sps. Arnaldo Natal [phone]. Pls contact with update

## 2018-09-22 NOTE — ED Provider Notes (Addendum)
[place] EMERGENCY DEPARTMENT Provider Note   CSN: [id number] Arrival date & time: 09/22/18  1824    History   Chief Complaint Chief Complaint  Patient presents with  . Weakness    HPI Marcus [name] is a 85 y.o. male.    Level 5 caveat due to altered mental status. HPI Patient presented reportedly with altered mental status.  Reportedly has had fevers at home.  Afebrile here.  No coughing.  States he just feels bad.  History of CHF with an EF of 20 to 25%.  Denies dysuria.  Denies chest pain.  Reportedly has had increased understands he is only 2 L of nasal cannula oxygen all the time confusion.  Difficulty putting on close and forgetting conversations.  Forgetting how to get in the bed. Past Medical History:  Diagnosis Date  . Atrial flutter ([place])    a. Dx 07/2014, s/p TEE/DCCV.  . Cataracts, bilateral   . Chronic systolic CHF (congestive heart failure) ([place])    a. s/p CRT-D 09/2013. b. Device explant with temp perm then subsequent CRT-P 07/2014.  Marland Kitchen CKD (chronic kidney disease), stage III ([place])   . Coronary artery disease    a. s/p remote CABG x 2(VG->OM, LIMA->LAD;  b. Late 90's s/p PCI of RCA;  c. 12/09 Cath/PCI: LM 93m, 70-80d (3.0x45mm Xience DES), LAD100p, LCX 80-90p (2.25x31mmTaxus Atom DES), RCA 100d (2.5x30mm Xience DES), VG->OM 100, LIMA->LAD nl, EF 30-35%.  Marland Kitchen DVT (deep venous thrombosis) ([place]) 2017   Right knee  . Hard of hearing   . History of colon polyps   . History of gastric ulcer   . History of gout   . History of shingles   . Hyperlipidemia   . Hypertension   . Ischemic cardiomyopathy    a. EF 35-40% by echo 05/2012, b. EF 20-25%, akinesis and scarring of inferolateral, inferior and inferoseptal myocardium, mild AI, mod MR, LA mod dilated, RV mildly dialted and sys fx mildly reduced, RA mildly dilated (09/2013)  c. EF 35-40% by 2D in 07/2014, 25-30% by TEE.  . Macular degeneration, dry   . Pacemaker infection ([place])   . PAF (paroxysmal  atrial fibrillation) ([place])   . Peripheral vascular disease ([place])   . Presence of permanent cardiac pacemaker 07/23/2014   [facility] [id number], Serial [id number]  . Prostate cancer ([place])   . Symptomatic bradycardia    a. 08/2003 s/p MDT Enpulse [id number] Dual chamber PPM ser # [id number]. b. Device explant due to infection/temp perm insertion/PPM 07/2014.    Patient Active Problem List   Diagnosis Date Noted  . Frequent PVCs 02/23/2015  . Chronic systolic CHF (congestive heart failure) ([place])   . Pacemaker infection ([place]) 07/19/2014  . CKD (chronic kidney disease) stage 3, GFR 30-59 ml/min (HCC) 11/25/2013  . Pacemaker pocket hematoma 10/22/2013  . Acute on chronic systolic heart failure ([place]) 10/06/2013  . H/O noncompliance with medical treatment, presenting hazards to health 08/31/2013  . Peripheral vascular disease, unspecified ([place]) 08/19/2013  . Arterial embolism of right leg ([place]) 07/30/2013  . Ischemic leg 07/29/2013  . Chronic systolic heart failure ([place]) 11/20/2012  . Paroxysmal atrial flutter ([place]) 06/29/2012  . Acute on chronic systolic CHF (congestive heart failure) ([place]) 06/28/2012  . Ischemic cardiomyopathy 06/28/2012  . Hyperlipidemia 06/28/2012  . Normocytic anemia 06/28/2012  . Anemia 06/25/2012  . Pacemaker-Medtronic 02/01/2012  . HTN (hypertension) 02/28/2011  . Essential hypertension, benign 06/16/2010  . CAD 06/16/2010  . Atrial fibrillation ([place])  06/16/2010  . SICK SINUS SYNDROME 06/16/2010    Past Surgical History:  Procedure Laterality Date  . ANGIOPLASTY     stent placement  . APPENDECTOMY    . BI-VENTRICULAR PACEMAKER INSERTION [name] 07/23/2014   Procedure: BI-VENTRICULAR PACEMAKER INSERTION (CRT-P);  Surgeon: Deboraha Sprang, MD;  Location: [facility] CATH LAB;  Service: Cardiovascular;  Laterality: [name];  . BI-VENTRICULAR PACEMAKER UPGRADE [name] 10/08/2013   Procedure: BI-VENTRICULAR PACEMAKER UPGRADE;  Surgeon: [name], MD;  Location: [facility] CATH LAB;   Service: Cardiovascular;  Laterality: [name];  . CARDIAC CATHETERIZATION  most recent in 2009   several  . CARDIAC CATHETERIZATION Right 07/19/2014   Procedure: TEMPORARY PACEMAKER;  Surgeon: [name], MD;  Location: [place];  Service: Cardiovascular;  Laterality: Right;  . CARDIAC CATHETERIZATION [name] 03/01/2016   Procedure: Right Heart Cath;  Surgeon: [name], MD;  Location: [place] CV LAB;  Service: Cardiovascular;  Laterality: [name];  . CARDIOVERSION  06/26/2012   Procedure: CARDIOVERSION;  Surgeon: Lelon Perla, MD;  Location: [facility] ENDOSCOPY;  Service: Cardiovascular;  Laterality: [name];  . CARDIOVERSION [name] 07/27/2014   Procedure: CARDIOVERSION;  Surgeon: [name], MD;  Location: [place];  Service: Cardiovascular;  Laterality: [name];  . CATARACT EXTRACTION W/PHACO Left 09/24/2016   Procedure: CATARACT EXTRACTION PHACO AND INTRAOCULAR LENS PLACEMENT ([place])  Left;  Surgeon: Leandrew Koyanagi, MD;  Location: [place];  Service: Ophthalmology;  Laterality: Left;  . CATARACT EXTRACTION W/PHACO Right 10/17/2016   Procedure: CATARACT EXTRACTION PHACO AND INTRAOCULAR LENS PLACEMENT ([place])  Right;  Surgeon: Leandrew Koyanagi, MD;  Location: [place];  Service: Ophthalmology;  Laterality: Right;  prefers mid to late morning arrival  . COLONOSCOPY    . CORONARY ANGIOPLASTY     couple   . CORONARY ARTERY BYPASS GRAFT  1990   left main  . DOPPLER ECHOCARDIOGRAPHY  06/24/2002   EF 60-65%  . EMBOLECTOMY Right 07/28/2013   Procedure: Right Popliteal and Tibial Embolectomy;  Surgeon: Angelia Mould, MD;  Location: [place];  Service: Vascular;  Laterality: Right;  . INSERT / REPLACE / REMOVE PACEMAKER     1st pacemaker placed 10+yrs ago  . PACEMAKER LEAD REMOVAL Right 07/19/2014   Procedure: PACEMAKER LEAD REMOVAL/EXTRACTION;  Surgeon: [name], MD;  Location: [place];  Service: Cardiovascular;  Laterality: Right;  DR. Lucianne Lei TRIGT TO BACK UP CASE  . PERMANENT  PACEMAKER GENERATOR CHANGE [name] 08/27/2012   Procedure: PERMANENT PACEMAKER GENERATOR CHANGE;  Surgeon: [name], MD;  Location: [facility] CATH LAB;  Service: Cardiovascular;  Laterality: [name];  . TEE WITHOUT CARDIOVERSION  06/26/2012   Procedure: TRANSESOPHAGEAL ECHOCARDIOGRAM (TEE);  Surgeon: Lelon Perla, MD;  Location: [facility] ENDOSCOPY;  Service: Cardiovascular;  Laterality: [name];  . TEE WITHOUT CARDIOVERSION [name] 07/27/2014   Procedure: TRANSESOPHAGEAL ECHOCARDIOGRAM (TEE);  Surgeon: [name], MD;  Location: [place];  Service: Cardiovascular;  Laterality: [name];        Home Medications    Prior to Admission medications   Medication Sig Start Date End Date Taking? Authorizing Provider  amiodarone (PACERONE) 200 MG tablet TAKE 1/2 TABLET (100MG      TOTAL) DAILY 08/27/18   [name], MD  carvedilol (COREG) 6.25 MG tablet TAKE 1 TABLET BY MOUTH TWICE A DAY WITH MEALS 07/01/18   [name], MD  Coenzyme Q10 (CO Q 10) 100 MG CAPS Take 1 capsule by mouth 2 (two) times daily.    [provider]  ELIQUIS 2.5 MG TABS tablet TAKE 1 TABLET TWICE A DAY 08/27/18   [name], MD  torsemide (DEMADEX) 20 MG tablet Take 2 tablets (40 mg total) by mouth daily. 08/22/18   [name], MD    Family History Family History  Problem Relation Age of Onset  . Hypertension Mother   . Colon cancer Mother   . Cancer Mother        Colon  . Alcohol abuse Father     Social History Social History   Tobacco Use  . Smoking status: Former Smoker    Last attempt to quit: 02/10/1971    Years since quitting: 47.6  . Smokeless tobacco: Never Used  . Tobacco comment: quit smoking 21yrs ago  Substance Use Topics  . Alcohol use: No    Comment: nothing in 70yrs   . Drug use: No     Allergies   Patient has no known allergies.   Review of Systems Review of Systems  Unable to perform ROS: Mental status change     Physical Exam Updated Vital Signs BP 94/64   Pulse 75   Temp  97.7 F (36.5 C) (Oral)   Resp (!) 26   Ht 5\' 8"  (1.727 m)   Wt 74.8 kg   SpO2 98%   BMI 25.09 kg/m   Physical Exam Vitals signs and nursing note reviewed.  HENT:     Head: Atraumatic.     Mouth/Throat:     Mouth: Mucous membranes are moist.  Eyes:     Extraocular Movements: Extraocular movements intact.  Neck:     Musculoskeletal: Neck supple.  Cardiovascular:     Rate and Rhythm: Regular rhythm.  Pulmonary:     Breath sounds: No wheezing, rhonchi or rales.  Abdominal:     Tenderness: There is no abdominal tenderness.  Musculoskeletal:     Right lower leg: Edema present.     Left lower leg: Edema present.     Comments: Some edema bilateral lower legs.  Skin:    Capillary Refill: Capillary refill takes less than 2 seconds.  Neurological:     Mental Status: He is alert.     Comments: Awake and will answer some questions but appears somewhat confused.      ED Treatments / Results  Labs (all labs ordered are listed, but only abnormal results are displayed) Labs Reviewed  CBC - Abnormal; Notable for the following components:      Result Value   RBC 3.59 (*)    Hemoglobin 10.7 (*)    HCT 34.7 (*)    All other components within normal limits  BRAIN NATRIURETIC PEPTIDE - Abnormal; Notable for the following components:   B Natriuretic Peptide 1,259.5 (*)    All other components within normal limits  TROPONIN I - Abnormal; Notable for the following components:   Troponin I 0.08 (*)    All other components within normal limits  COMPREHENSIVE METABOLIC PANEL - Abnormal; Notable for the following components:   CO2 16 (*)    Glucose, Bld 119 (*)    BUN 91 (*)    Creatinine, Ser 3.77 (*)    GFR calc non Af Amer 14 (*)    GFR calc Af Amer 16 (*)    Anion gap 20 (*)    All other components within normal limits  SARS CORONAVIRUS 2 (HOSPITAL ORDER, [place] LAB)  CULTURE, BLOOD (ROUTINE X 2)  CULTURE, BLOOD (ROUTINE X 2)  URINALYSIS, ROUTINE W  REFLEX MICROSCOPIC  LACTIC ACID, PLASMA    EKG EKG Interpretation  Date/Time:  Monday Sep 22 2018 19:22:24 EDT Ventricular Rate:  74 PR Interval:    QRS Duration: 167 QT Interval:  443 QTC Calculation: 492 R Axis:   -85 Text Interpretation:  Atrial-ventricular dual-paced rhythm No further analysis attempted due to paced rhythm Confirmed by Davonna Belling [phone]) on 09/22/2018 8:07:36 PM   Radiology Ct Head Wo Contrast  Result Date: 09/22/2018 CLINICAL DATA:  Altered level of consciousness. Weakness. EXAM: CT HEAD WITHOUT CONTRAST TECHNIQUE: Contiguous axial images were obtained from the base of the skull through the vertex without intravenous contrast. COMPARISON:  None. FINDINGS: Brain: There is no evidence of acute infarct, intracranial hemorrhage, mass, midline shift, or extra-axial fluid collection. There is mild cerebral atrophy. Cerebral white matter hypodensities are nonspecific but compatible with mild chronic small vessel ischemic disease. A left choroidal fissure cyst is noted. Vascular: Calcified atherosclerosis at the skull base. No suspicious vascular hyperdensity. Skull: No fracture or focal osseous lesion. Sinuses/Orbits: Paranasal sinuses and mastoid air cells are clear. Bilateral cataract extraction. Other: None. IMPRESSION: 1. No evidence of acute intracranial abnormality. 2. Mild chronic small vessel ischemic disease. Electronically Signed   By: Logan Bores M.D.   On: 09/22/2018 21:48   Dg Chest Portable 1 View  Result Date: 09/22/2018 CLINICAL DATA:  Altered mental status EXAM: PORTABLE CHEST 1 VIEW COMPARISON:  08/04/2014 FINDINGS: Cardiac shadow is mildly enlarged but stable. Postsurgical changes and pacing device are again seen and stable. Chronic scarring in the left base is noted. Mild increased density is noted in the right hemithorax consistent with small posterior effusion. Mild right basilar infiltrate is noted as well. No focal confluent infiltrate is seen. No  bony abnormality is noted. IMPRESSION: Small right pleural effusion with mild basilar infiltrate Chronic changes in the right base. Electronically Signed   By: Inez Catalina M.D.   On: 09/22/2018 21:42    Procedures Procedures (including critical care time)  Medications Ordered in ED Medications  sodium chloride 0.9 % bolus 500 mL (0 mLs Intravenous Stopped 09/22/18 2051)     Initial Impression / Assessment and Plan / ED Course  I have reviewed the triage vital signs and the nursing notes.  Pertinent labs & imaging results that were available during my care of the patient were reviewed by me and considered in my medical decision making (see chart for details).       Patient presents with mental status changes.  Confusion.  I been able to talk with the patient's wife now and states that home health person checks his temperature in the axilla and it was 99.4 still he told me he had a fever.  However does not have fever here.  Does not have a white count.  Troponin mildly elevated.  Patient's weight is pretty good for him, however his creatinine is increased.  His BNP is up also.  Somewhat mixed picture in terms of his volume status.  Troponin mildly elevated.  Head CT reassuring.  Chest x-ray does not show definite pneumonia but does have effusion.  Urine does not show infection.  However with mental status changes troponin and CHF history feel patient benefit from [place] to the hospital.  Blood pressure improved somewhat.  Initially hypotensive.  Now improved but his baseline systolic pressure looks to be just over 007 systolic.  Will admit to internal medicine  D/w Dr, Marlowe Sax.  She requests to consult cardiology  and request a consult to nephrology before she can take it as an admission.   Fin.al Clinical Impressions(s) / ED Diagnoses   Final diagnoses:  Encephalopathy  Congestive heart failure, unspecified HF chronicity, unspecified heart failure type [facility])    ED Discharge Orders     None       Davonna Belling, MD 09/22/18 [place]y Bath    Davonna Belling, MD 09/23/18 [phone]

## 2018-09-22 NOTE — ED Notes (Signed)
Date and time results received: 09/22/18 2004 (use smartphrase ".now" to insert current time)  Test: troponin Critical Value: 0.08  Name of Provider Notified: Alvino Chapel  Orders Received? Or Actions Taken?: orders pending

## 2018-09-22 NOTE — Telephone Encounter (Signed)
Patients wife left VM stating pt has declined since Friday. She said he is more confused and seems weaker than normal. Patients wife requests a call directly from [place].

## 2018-09-22 NOTE — H&P (Signed)
History and Physical    Marcus [name] JQB:[id number] DOB: 1928/08/23 DOA: 09/22/2018  PCP: Patient, No Pcp Per Patient coming from: Home  Chief Complaint: Altered mental status, generalized weakness  HPI: Marcus [name] is a 85 y.o. male with medical history significant of paroxysmal atrial fibrillation, sick sinus syndrome status post PPM, ischemic cardiomyopathy with EF 35 to 40%, CKD stage III, CAD status post CABG and PCIs, DVT, hypertension, hyperlipidemia, peripheral vascular disease, and conditions listed below presenting to the hospital for evaluation of altered mental status and generalized weakness.  Home health nurse reported temperature of 99.4 F to cardiology office who then advised patient to come into the hospital for further evaluation. Patient states his wife has been concerned that he has not been acting right for the past 2 weeks.  Also reports generalized weakness.  Denies any orthopnea.  He is not sure if he is having lower extremity edema.  Denies any recent weight gain.  Does endorse increased dietary fluid and sodium intake.  Denies any chest pain or cough.  Denies any fevers, shortness of breath, cough, nausea, vomiting, abdominal pain, diarrhea, dysuria, urinary frequency, or urgency.  Review of Systems: As per HPI otherwise 10 point review of systems negative.  Past Medical History:  Diagnosis Date  . Atrial flutter ([place])    a. Dx 07/2014, s/p TEE/DCCV.  . Cataracts, bilateral   . Chronic systolic CHF (congestive heart failure) ([place])    a. s/p CRT-D 09/2013. b. Device explant with temp perm then subsequent CRT-P 07/2014.  Marland Kitchen CKD (chronic kidney disease), stage III ([place])   . Coronary artery disease    a. s/p remote CABG x 2(VG->OM, LIMA->LAD;  b. Late 90's s/p PCI of RCA;  c. 12/09 Cath/PCI: LM 5m, 70-80d (3.0x52mm Xience DES), LAD100p, LCX 80-90p (2.25x20mmTaxus Atom DES), RCA 100d (2.5x37mm Xience DES), VG->OM 100, LIMA->LAD nl, EF 30-35%.  Marland Kitchen DVT (deep venous  thrombosis) ([place]) 2017   Right knee  . Hard of hearing   . History of colon polyps   . History of gastric ulcer   . History of gout   . History of shingles   . Hyperlipidemia   . Hypertension   . Ischemic cardiomyopathy    a. EF 35-40% by echo 05/2012, b. EF 20-25%, akinesis and scarring of inferolateral, inferior and inferoseptal myocardium, mild AI, mod MR, LA mod dilated, RV mildly dialted and sys fx mildly reduced, RA mildly dilated (09/2013)  c. EF 35-40% by 2D in 07/2014, 25-30% by TEE.  . Macular degeneration, dry   . Pacemaker infection ([place])   . PAF (paroxysmal atrial fibrillation) ([place])   . Peripheral vascular disease ([place])   . Presence of permanent cardiac pacemaker 07/23/2014   [facility] [id number], Serial [id number]  . Prostate cancer ([place])   . Symptomatic bradycardia    a. 08/2003 s/p MDT Enpulse [id number] Dual chamber PPM ser # [id number] H. b. Device explant due to infection/temp perm insertion/PPM 07/2014.    Past Surgical History:  Procedure Laterality Date  . ANGIOPLASTY     stent placement  . APPENDECTOMY    . BI-VENTRICULAR PACEMAKER INSERTION [name] 07/23/2014   Procedure: BI-VENTRICULAR PACEMAKER INSERTION (CRT-P);  Surgeon: Deboraha Sprang, MD;  Location: [facility] CATH LAB;  Service: Cardiovascular;  Laterality: [name];  . BI-VENTRICULAR PACEMAKER UPGRADE [name] 10/08/2013   Procedure: BI-VENTRICULAR PACEMAKER UPGRADE;  Surgeon: [name], MD;  Location: [facility] CATH LAB;  Service: Cardiovascular;  Laterality: [name];  . CARDIAC CATHETERIZATION  most recent in 2009   several  . CARDIAC CATHETERIZATION Right 07/19/2014   Procedure: TEMPORARY PACEMAKER;  Surgeon: [name], MD;  Location: [place];  Service: Cardiovascular;  Laterality: Right;  . CARDIAC CATHETERIZATION [name] 03/01/2016   Procedure: Right Heart Cath;  Surgeon: [name], MD;  Location: [place] CV LAB;  Service: Cardiovascular;  Laterality: [name];  . CARDIOVERSION  06/26/2012   Procedure: CARDIOVERSION;   Surgeon: Lelon Perla, MD;  Location: [facility] ENDOSCOPY;  Service: Cardiovascular;  Laterality: [name];  . CARDIOVERSION [name] 07/27/2014   Procedure: CARDIOVERSION;  Surgeon: [name], MD;  Location: [place];  Service: Cardiovascular;  Laterality: [name];  . CATARACT EXTRACTION W/PHACO Left 09/24/2016   Procedure: CATARACT EXTRACTION PHACO AND INTRAOCULAR LENS PLACEMENT ([place])  Left;  Surgeon: Leandrew Koyanagi, MD;  Location: [place];  Service: Ophthalmology;  Laterality: Left;  . CATARACT EXTRACTION W/PHACO Right 10/17/2016   Procedure: CATARACT EXTRACTION PHACO AND INTRAOCULAR LENS PLACEMENT ([place])  Right;  Surgeon: Leandrew Koyanagi, MD;  Location: [place];  Service: Ophthalmology;  Laterality: Right;  prefers mid to late morning arrival  . COLONOSCOPY    . CORONARY ANGIOPLASTY     couple   . CORONARY ARTERY BYPASS GRAFT  1990   left main  . DOPPLER ECHOCARDIOGRAPHY  06/24/2002   EF 60-65%  . EMBOLECTOMY Right 07/28/2013   Procedure: Right Popliteal and Tibial Embolectomy;  Surgeon: Angelia Mould, MD;  Location: [place];  Service: Vascular;  Laterality: Right;  . INSERT / REPLACE / REMOVE PACEMAKER     1st pacemaker placed 10+yrs ago  . PACEMAKER LEAD REMOVAL Right 07/19/2014   Procedure: PACEMAKER LEAD REMOVAL/EXTRACTION;  Surgeon: [name], MD;  Location: [place];  Service: Cardiovascular;  Laterality: Right;  DR. Lucianne Lei TRIGT TO BACK UP CASE  . PERMANENT PACEMAKER GENERATOR CHANGE [name] 08/27/2012   Procedure: PERMANENT PACEMAKER GENERATOR CHANGE;  Surgeon: [name], MD;  Location: [facility] CATH LAB;  Service: Cardiovascular;  Laterality: [name];  . TEE WITHOUT CARDIOVERSION  06/26/2012   Procedure: TRANSESOPHAGEAL ECHOCARDIOGRAM (TEE);  Surgeon: Lelon Perla, MD;  Location: [facility] ENDOSCOPY;  Service: Cardiovascular;  Laterality: [name];  . TEE WITHOUT CARDIOVERSION [name] 07/27/2014   Procedure: TRANSESOPHAGEAL ECHOCARDIOGRAM (TEE);  Surgeon: [name], MD;   Location: [facility] ENDOSCOPY;  Service: Cardiovascular;  Laterality: [name];     reports that he quit smoking about 47 years ago. He has never used smokeless tobacco. He reports that he does not drink alcohol or use drugs.  No Known Allergies  Family History  Problem Relation Age of Onset  . Hypertension Mother   . Colon cancer Mother   . Cancer Mother        Colon  . Alcohol abuse Father     Prior to Admission medications   Medication Sig Start Date End Date Taking? Authorizing Provider  amiodarone (PACERONE) 200 MG tablet TAKE 1/2 TABLET (100MG      TOTAL) DAILY 08/27/18   [name], MD  carvedilol (COREG) 6.25 MG tablet TAKE 1 TABLET BY MOUTH TWICE A DAY WITH MEALS 07/01/18   [name], MD  Coenzyme Q10 (CO Q 10) 100 MG CAPS Take 1 capsule by mouth 2 (two) times daily.    [provider]  ELIQUIS 2.5 MG TABS tablet TAKE 1 TABLET TWICE A DAY 08/27/18   [name], MD  torsemide (DEMADEX) 20 MG tablet Take 2 tablets (40 mg total) by mouth daily. 08/22/18  [name], MD    Physical Exam: Vitals:   09/23/18 0130 09/23/18 0150 09/23/18 0345 09/23/18 0400  BP: 92/65 121/84 100/63 101/65  Pulse: 73 76 77 78  Resp: 17 18 (!) 24 19  Temp:  97.7 F (36.5 C) 100 F (37.8 C)   TempSrc:  Oral Oral   SpO2: 100% 100% 98% 100%  Weight:  77.5 kg    Height:  5\' 9"  (1.753 m)      Physical Exam  Constitutional: He is oriented to person, place, and time. He appears well-developed and well-nourished. No distress.  HENT:  Head: Normocephalic.  Eyes: Right eye exhibits no discharge. Left eye exhibits no discharge.  Neck: Neck supple.  Cardiovascular: Normal rate, regular rhythm and intact distal pulses.  Pulmonary/Chest: Effort normal. No respiratory distress. He has no wheezes. He has no rales.  Abdominal: Soft. Bowel sounds are normal. He exhibits no distension. There is no abdominal tenderness. There is no guarding.  Musculoskeletal:     Comments: Trace bilateral  lower extremity edema  Neurological: He is alert and oriented to person, place, and time.  Skin: Skin is warm and dry. He is not diaphoretic.     Labs on Admission: I have personally reviewed following labs and imaging studies  CBC: Recent Labs  Lab 09/22/18 1857  WBC 9.7  HGB 10.7*  HCT 34.7*  MCV 96.7  PLT 119   Basic Metabolic Panel: Recent Labs  Lab 09/22/18 1933 09/23/18 0209  NA 137 138  K 3.8 4.1  CL 101 102  CO2 16* 17*  GLUCOSE 119* 119*  BUN 91* 91*  CREATININE 3.77* 3.66*  CALCIUM 9.3 9.0  PHOS  --  4.0   GFR: Estimated Creatinine Clearance: 14.2 mL/min (A) (by C-G formula based on SCr of 3.66 mg/dL (H)). Liver Function Tests: Recent Labs  Lab 09/22/18 1933 09/23/18 0209  AST 20  --   ALT 13  --   ALKPHOS 66  --   BILITOT 1.1  --   PROT 7.9  --   ALBUMIN 3.5 3.2*   No results for input(s): LIPASE, AMYLASE in the last 168 hours. No results for input(s): AMMONIA in the last 168 hours. Coagulation Profile: No results for input(s): INR, PROTIME in the last 168 hours. Cardiac Enzymes: Recent Labs  Lab 09/22/18 1857 09/23/18 0209  TROPONINI 0.08* 0.10*   BNP (last 3 results) No results for input(s): PROBNP in the last 8760 hours. HbA1C: No results for input(s): HGBA1C in the last 72 hours. CBG: No results for input(s): GLUCAP in the last 168 hours. Lipid Profile: No results for input(s): CHOL, HDL, LDLCALC, TRIG, CHOLHDL, LDLDIRECT in the last 72 hours. Thyroid Function Tests: No results for input(s): TSH, T4TOTAL, FREET4, T3FREE, THYROIDAB in the last 72 hours. Anemia Panel: Recent Labs    09/23/18 0209  VITAMINB12 773  FOLATE 14.2  FERRITIN 216  TIBC 315  IRON 14*  RETICCTPCT 0.8   Urine analysis:    Component Value Date/Time   COLORURINE YELLOW 09/22/2018 1932   APPEARANCEUR CLEAR 09/22/2018 1932   LABSPEC 1.011 09/22/2018 1932   PHURINE 5.0 09/22/2018 1932   GLUCOSEU NEGATIVE 09/22/2018 1932   HGBUR NEGATIVE 09/22/2018  1932   BILIRUBINUR NEGATIVE 09/22/2018 1932   KETONESUR NEGATIVE 09/22/2018 1932   PROTEINUR NEGATIVE 09/22/2018 1932   UROBILINOGEN 0.2 05/24/2012 1344   NITRITE NEGATIVE 09/22/2018 1932   LEUKOCYTESUR NEGATIVE 09/22/2018 1932    Radiological Exams on Admission: Ct Head Wo Contrast  Result Date: 09/22/2018 CLINICAL DATA:  Altered level of consciousness. Weakness. EXAM: CT HEAD WITHOUT CONTRAST TECHNIQUE: Contiguous axial images were obtained from the base of the skull through the vertex without intravenous contrast. COMPARISON:  None. FINDINGS: Brain: There is no evidence of acute infarct, intracranial hemorrhage, mass, midline shift, or extra-axial fluid collection. There is mild cerebral atrophy. Cerebral white matter hypodensities are nonspecific but compatible with mild chronic small vessel ischemic disease. A left choroidal fissure cyst is noted. Vascular: Calcified atherosclerosis at the skull base. No suspicious vascular hyperdensity. Skull: No fracture or focal osseous lesion. Sinuses/Orbits: Paranasal sinuses and mastoid air cells are clear. Bilateral cataract extraction. Other: None. IMPRESSION: 1. No evidence of acute intracranial abnormality. 2. Mild chronic small vessel ischemic disease. Electronically Signed   By: Logan Bores M.D.   On: 09/22/2018 21:48   US Renal  Result Date: 09/23/2018 CLINICAL DATA:  Acute kidney injury EXAM: RENAL / URINARY TRACT ULTRASOUND COMPLETE COMPARISON:  07/18/2018 FINDINGS: Right Kidney: Renal measurements: 9.9 x 4.3 x 4.9 cm = volume: 109 mL. Cortical thinning and increased echotexture. No mass or hydronephrosis. Left Kidney: Renal measurements: 9.9 x 3.7 x 4.3 cm = volume: 82 mL. Cortical thinning with increased echotexture. No mass or hydronephrosis. Bladder: Appears normal for degree of bladder distention. IMPRESSION: Stable cortical thinning and increased echotexture compatible with chronic medical renal disease. No hydronephrosis or acute findings.  Electronically Signed   By: Rolm Baptise M.D.   On: 09/23/2018 03:25   Dg Chest Portable 1 View  Result Date: 09/22/2018 CLINICAL DATA:  Altered mental status EXAM: PORTABLE CHEST 1 VIEW COMPARISON:  08/04/2014 FINDINGS: Cardiac shadow is mildly enlarged but stable. Postsurgical changes and pacing device are again seen and stable. Chronic scarring in the left base is noted. Mild increased density is noted in the right hemithorax consistent with small posterior effusion. Mild right basilar infiltrate is noted as well. No focal confluent infiltrate is seen. No bony abnormality is noted. IMPRESSION: Small right pleural effusion with mild basilar infiltrate Chronic changes in the right base. Electronically Signed   By: Inez Catalina M.D.   On: 09/22/2018 21:42    EKG: Independently reviewed.  Paced rhythm.  Assessment/Plan Principal Problem:   CAP (community acquired pneumonia) Active Problems:   Chronic systolic CHF (congestive heart failure) (HCC)   Elevated troponin   AKI (acute kidney injury) ([place])   CKD (chronic kidney disease) stage 4, GFR 15-29 ml/min (HCC)   Suspected community-acquired pneumonia Hypotensive on arrival.  Not tachycardic.  Temperature 100 F. No leukocytosis.  Lactic acid normal.  Received 500 cc fluid bolus in the ED.  Blood pressure now improved.  UA not suggestive of infection.  SARS-CoV-2 rapid test negative.  Chest x-ray showing mild right basilar infiltrate.  Procalcitonin 0.94. -Ceftriaxone and azithromycin -Blood culture x2 pending -Supplemental oxygen if needed  Chronic systolic congestive heart failure BNP 1259.  Chest x-ray showing small right pleural effusion.  Clinically does not appear significantly volume overloaded.  Not hypoxic.  Seen by cardiology. -Hold Coreg and torsemide given hypotension on arrival  Elevated troponin Troponin mildly elevated at 0.08 >0.10.  EKG with paced rhythm.  Patient is not complaining of chest pain. -Cardiac monitoring  -Continue to trend troponin  AKI on CKD 4 BUN 91.  Creatinine 3.7, was 2.8 on June 25, 2018. Bicarb 16.  Renal ultrasound with evidence of chronic medical renal disease; no hydronephrosis or acute findings. -Received 500 cc fluid bolus in the ED. Will hold  off giving additional fluid given elevated BNP. -Check urine sodium, creatinine -Nephrology has been consulted by ED physician, will consult in a.m. -Continue to monitor renal function -Give 1 amp sodium bicarb  High anion gap metabolic acidosis Likely related to AKI.  Anion gap 20, bicarb 16.  No hyperglycemia.  No lactic acidosis. -Continue to monitor BMP  ?  Acute encephalopathy Head CT negative for acute intracranial abnormality.  Currently AAO x3 and answering questions appropriately.  Unclear what his baseline is. -Discuss with family in a.m.  Chronic normocytic anemia -Stable.  Hemoglobin 10.7, similar to labs done 3 months ago. -Anemia panel  Paroxysmal atrial fibrillation Stable.  Continue amiodarone and Eliquis.  Unable to safely order remainder of home medications at this time as pharmacy medication reconciliation is pending.  DVT prophylaxis: Eliquis Code Status: Patient wishes to be DNR. Family Communication: No family available. Disposition Plan: Anticipate discharge after clinical improvement. Consults called: Cardiology, nephrology  Admission status: It is my clinical opinion that admission to [place] is reasonable and necessary in this 85 y.o. male . presenting with hypotension, suspected community-acquired pneumonia, AKI . Workup and treatment include IV antibiotics, monitoring renal function  Given the aforementioned, the predictability of an adverse outcome is felt to be significant. I expect that the patient will require at least 2 midnights in the hospital to treat this condition.   This chart was dictated using voice recognition software.  Despite best efforts to proofread, errors can occur which  can change the documentation meaning.  Shela Leff MD [facility] Pager [phone]  If 7PM-7AM, please contact night-coverage [email or web address] Password [email or web address]  09/23/2018, 7:04 AM

## 2018-09-22 NOTE — ED Triage Notes (Signed)
Pt brought to ED by wife. Pt is from home. Pt has been weak and feverish at home. No fever in triage. Denies cough or being around anyone sick.

## 2018-09-22 NOTE — Telephone Encounter (Signed)
Called by [facility] Nurse that pt has not been himself since Friday.  Low grade fever 99.4.  Concern for infection.  Asked them to come to ER. Pt with no exposure to COVID.  no cough.    Pt and his wife are agreeable to come for labs, u/a CXR  Ad exam.  ER Triage notified.

## 2018-09-22 NOTE — ED Notes (Signed)
Pt thought he was going home, pt removed himself from monitor and removed his IV, pt got blood all over his clothes and room, redressed pt in gown, redirected pt and started new IV

## 2018-09-22 NOTE — ED Notes (Signed)
This RN gave pts wife update

## 2018-09-22 NOTE — ED Notes (Signed)
This rn dialed wifes number so pt could talk to her.

## 2018-09-22 NOTE — Telephone Encounter (Signed)
He needs ER evaluation.  I just saw this note, will call in am if they did not go to the ER.

## 2018-09-23 ENCOUNTER — Encounter (HOSPITAL_COMMUNITY): Payer: Self-pay | Admitting: *Deleted

## 2018-09-23 ENCOUNTER — Inpatient Hospital Stay (HOSPITAL_COMMUNITY): Payer: Medicare Other

## 2018-09-23 ENCOUNTER — Telehealth (HOSPITAL_COMMUNITY): Payer: Medicare Other | Admitting: Cardiology

## 2018-09-23 ENCOUNTER — Telehealth: Payer: Self-pay

## 2018-09-23 ENCOUNTER — Other Ambulatory Visit: Payer: Self-pay

## 2018-09-23 DIAGNOSIS — I739 Peripheral vascular disease, unspecified: Secondary | ICD-10-CM | POA: Diagnosis present

## 2018-09-23 DIAGNOSIS — I5022 Chronic systolic (congestive) heart failure: Secondary | ICD-10-CM

## 2018-09-23 DIAGNOSIS — R7989 Other specified abnormal findings of blood chemistry: Secondary | ICD-10-CM

## 2018-09-23 DIAGNOSIS — E785 Hyperlipidemia, unspecified: Secondary | ICD-10-CM | POA: Diagnosis present

## 2018-09-23 DIAGNOSIS — Z7901 Long term (current) use of anticoagulants: Secondary | ICD-10-CM | POA: Diagnosis not present

## 2018-09-23 DIAGNOSIS — I2510 Atherosclerotic heart disease of native coronary artery without angina pectoris: Secondary | ICD-10-CM | POA: Diagnosis present

## 2018-09-23 DIAGNOSIS — R778 Other specified abnormalities of plasma proteins: Secondary | ICD-10-CM

## 2018-09-23 DIAGNOSIS — N179 Acute kidney failure, unspecified: Secondary | ICD-10-CM

## 2018-09-23 DIAGNOSIS — Z66 Do not resuscitate: Secondary | ICD-10-CM | POA: Diagnosis present

## 2018-09-23 DIAGNOSIS — I130 Hypertensive heart and chronic kidney disease with heart failure and stage 1 through stage 4 chronic kidney disease, or unspecified chronic kidney disease: Secondary | ICD-10-CM | POA: Diagnosis present

## 2018-09-23 DIAGNOSIS — I480 Paroxysmal atrial fibrillation: Secondary | ICD-10-CM | POA: Diagnosis present

## 2018-09-23 DIAGNOSIS — I959 Hypotension, unspecified: Secondary | ICD-10-CM | POA: Insufficient documentation

## 2018-09-23 DIAGNOSIS — N184 Chronic kidney disease, stage 4 (severe): Secondary | ICD-10-CM

## 2018-09-23 DIAGNOSIS — Z9861 Coronary angioplasty status: Secondary | ICD-10-CM | POA: Diagnosis not present

## 2018-09-23 DIAGNOSIS — Z8546 Personal history of malignant neoplasm of prostate: Secondary | ICD-10-CM | POA: Diagnosis not present

## 2018-09-23 DIAGNOSIS — J189 Pneumonia, unspecified organism: Secondary | ICD-10-CM | POA: Diagnosis present

## 2018-09-23 DIAGNOSIS — E872 Acidosis: Secondary | ICD-10-CM | POA: Diagnosis present

## 2018-09-23 DIAGNOSIS — R4182 Altered mental status, unspecified: Secondary | ICD-10-CM | POA: Diagnosis not present

## 2018-09-23 DIAGNOSIS — N183 Chronic kidney disease, stage 3 (moderate): Secondary | ICD-10-CM | POA: Diagnosis not present

## 2018-09-23 DIAGNOSIS — R7881 Bacteremia: Secondary | ICD-10-CM | POA: Diagnosis present

## 2018-09-23 DIAGNOSIS — Z87891 Personal history of nicotine dependence: Secondary | ICD-10-CM | POA: Diagnosis not present

## 2018-09-23 DIAGNOSIS — I5023 Acute on chronic systolic (congestive) heart failure: Secondary | ICD-10-CM | POA: Diagnosis present

## 2018-09-23 DIAGNOSIS — Z951 Presence of aortocoronary bypass graft: Secondary | ICD-10-CM | POA: Diagnosis not present

## 2018-09-23 DIAGNOSIS — H9190 Unspecified hearing loss, unspecified ear: Secondary | ICD-10-CM | POA: Diagnosis present

## 2018-09-23 DIAGNOSIS — R531 Weakness: Secondary | ICD-10-CM | POA: Diagnosis present

## 2018-09-23 DIAGNOSIS — D649 Anemia, unspecified: Secondary | ICD-10-CM | POA: Diagnosis present

## 2018-09-23 DIAGNOSIS — Z20828 Contact with and (suspected) exposure to other viral communicable diseases: Secondary | ICD-10-CM | POA: Diagnosis present

## 2018-09-23 DIAGNOSIS — Z86718 Personal history of other venous thrombosis and embolism: Secondary | ICD-10-CM | POA: Diagnosis not present

## 2018-09-23 DIAGNOSIS — A329 Listeriosis, unspecified: Secondary | ICD-10-CM | POA: Diagnosis not present

## 2018-09-23 DIAGNOSIS — I248 Other forms of acute ischemic heart disease: Secondary | ICD-10-CM | POA: Diagnosis present

## 2018-09-23 DIAGNOSIS — I255 Ischemic cardiomyopathy: Secondary | ICD-10-CM | POA: Diagnosis present

## 2018-09-23 LAB — BLOOD CULTURE ID PANEL (REFLEXED)
Acinetobacter baumannii: NOT DETECTED
Candida albicans: NOT DETECTED
Candida glabrata: NOT DETECTED
Candida krusei: NOT DETECTED
Candida parapsilosis: NOT DETECTED
Candida tropicalis: NOT DETECTED
Carbapenem resistance: NOT DETECTED
Enterobacter cloacae complex: NOT DETECTED
Enterobacteriaceae species: NOT DETECTED
Enterococcus species: NOT DETECTED
Escherichia coli: NOT DETECTED
Haemophilus influenzae: NOT DETECTED
Klebsiella oxytoca: NOT DETECTED
Klebsiella pneumoniae: NOT DETECTED
Listeria monocytogenes: DETECTED — AB
Methicillin resistance: NOT DETECTED
Neisseria meningitidis: NOT DETECTED
Proteus species: NOT DETECTED
Pseudomonas aeruginosa: NOT DETECTED
Serratia marcescens: NOT DETECTED
Staphylococcus aureus (BCID): NOT DETECTED
Staphylococcus species: NOT DETECTED
Streptococcus agalactiae: NOT DETECTED
Streptococcus pneumoniae: NOT DETECTED
Streptococcus pyogenes: NOT DETECTED
Streptococcus species: NOT DETECTED
Vancomycin resistance: NOT DETECTED

## 2018-09-23 LAB — TROPONIN I
Troponin I: 0.1 ng/mL (ref <0.03)
Troponin I: 0.12 ng/mL (ref <0.03)
Troponin I: 0.16 ng/mL (ref <0.03)
Troponin I: 0.16 ng/mL (ref <0.03)

## 2018-09-23 LAB — IRON AND TIBC
Iron: 14 ug/dL — ABNORMAL LOW (ref 45–182)
Saturation Ratios: 4 % — ABNORMAL LOW (ref 17.9–39.5)
TIBC: 315 ug/dL (ref 250–450)
UIBC: 301 ug/dL

## 2018-09-23 LAB — RENAL FUNCTION PANEL
Albumin: 3.2 g/dL — ABNORMAL LOW (ref 3.5–5.0)
Anion gap: 19 — ABNORMAL HIGH (ref 5–15)
BUN: 91 mg/dL — ABNORMAL HIGH (ref 8–23)
CO2: 17 mmol/L — ABNORMAL LOW (ref 22–32)
Calcium: 9 mg/dL (ref 8.9–10.3)
Chloride: 102 mmol/L (ref 98–111)
Creatinine, Ser: 3.66 mg/dL — ABNORMAL HIGH (ref 0.61–1.24)
GFR calc Af Amer: 16 mL/min — ABNORMAL LOW (ref >60)
GFR calc non Af Amer: 14 mL/min — ABNORMAL LOW (ref >60)
Glucose, Bld: 119 mg/dL — ABNORMAL HIGH (ref 70–99)
Phosphorus: 4 mg/dL (ref 2.5–4.6)
Potassium: 4.1 mmol/L (ref 3.5–5.1)
Sodium: 138 mmol/L (ref 135–145)

## 2018-09-23 LAB — RETICULOCYTES
Immature Retic Fract: 15.4 % (ref 2.3–15.9)
RBC.: 3.43 MIL/uL — ABNORMAL LOW (ref 4.22–5.81)
Retic Count, Absolute: 27.4 10*3/uL (ref 19.0–186.0)
Retic Ct Pct: 0.8 % (ref 0.4–3.1)

## 2018-09-23 LAB — FERRITIN: Ferritin: 216 ng/mL (ref 24–336)

## 2018-09-23 LAB — FOLATE: Folate: 14.2 ng/mL (ref >5.9)

## 2018-09-23 LAB — SODIUM, URINE, RANDOM: Sodium, Ur: 28 mmol/L

## 2018-09-23 LAB — VITAMIN B12: Vitamin B-12: 773 pg/mL (ref 180–914)

## 2018-09-23 LAB — MRSA PCR SCREENING: MRSA by PCR: NEGATIVE

## 2018-09-23 LAB — PROCALCITONIN: Procalcitonin: 0.94 ng/mL

## 2018-09-23 LAB — CREATININE, URINE, RANDOM: Creatinine, Urine: 74.94 mg/dL

## 2018-09-23 MED ORDER — ACETAMINOPHEN 650 MG RE SUPP: 650.0000 mg | Freq: Four times a day (QID) | RECTAL | Status: DC | PRN

## 2018-09-23 MED ORDER — SODIUM CHLORIDE 0.9 % IV SOLN
500.0000 mg | INTRAVENOUS | Status: DC
  Administered 2018-09-23: 500 mg via INTRAVENOUS
  Filled 2018-09-23 (×2): qty 500

## 2018-09-23 MED ORDER — SODIUM BICARBONATE 8.4 % IV SOLN
50.0000 meq | Freq: Once | INTRAVENOUS | Status: AC
  Administered 2018-09-23: 50 meq via INTRAVENOUS
  Filled 2018-09-23: qty 50

## 2018-09-23 MED ORDER — SODIUM BICARBONATE 650 MG PO TABS
650.0000 mg | ORAL_TABLET | Freq: Three times a day (TID) | ORAL | Status: DC
  Administered 2018-09-23 – 2018-09-26 (×9): 650 mg via ORAL
  Filled 2018-09-23 (×11): qty 1

## 2018-09-23 MED ORDER — SODIUM CHLORIDE 0.9 % IV SOLN
2.0000 g | Freq: Three times a day (TID) | INTRAVENOUS | Status: DC
  Administered 2018-09-23 – 2018-09-26 (×9): 2 g via INTRAVENOUS
  Filled 2018-09-23 (×11): qty 2000

## 2018-09-23 MED ORDER — AMIODARONE HCL 100 MG PO TABS
100.0000 mg | ORAL_TABLET | Freq: Every day | ORAL | Status: DC
  Administered 2018-09-23 – 2018-09-26 (×4): 100 mg via ORAL
  Filled 2018-09-23 (×4): qty 1

## 2018-09-23 MED ORDER — TORSEMIDE 20 MG PO TABS: 40.0000 mg | ORAL_TABLET | Freq: Every day | ORAL | Status: DC

## 2018-09-23 MED ORDER — AZITHROMYCIN 500 MG PO TABS: 500.0000 mg | ORAL_TABLET | Freq: Every day | ORAL | Status: DC

## 2018-09-23 MED ORDER — ACETAMINOPHEN 325 MG PO TABS: 650.0000 mg | ORAL_TABLET | Freq: Four times a day (QID) | ORAL | Status: DC | PRN

## 2018-09-23 MED ORDER — APIXABAN 2.5 MG PO TABS
2.5000 mg | ORAL_TABLET | Freq: Two times a day (BID) | ORAL | Status: DC
  Administered 2018-09-23 – 2018-09-26 (×8): 2.5 mg via ORAL
  Filled 2018-09-23 (×8): qty 1

## 2018-09-23 MED ORDER — SODIUM CHLORIDE 0.9 % IV SOLN
1.0000 g | INTRAVENOUS | Status: DC
  Administered 2018-09-23: 02:00:00 1 g via INTRAVENOUS
  Filled 2018-09-23: qty 10

## 2018-09-23 NOTE — Progress Notes (Addendum)
PROGRESS NOTE    Marcus [name]  QBH:[id number] DOB: 1929-03-21 DOA: 09/22/2018 PCP: Patient, No Pcp Per    Brief Narrative:  85 yo male who presented with altered mental status and generalized weakness. He does have the significant past medical history of paroxysmal atrial fibrillation, sick sinus syndrome sp pacer, ischemic cardiomyopathy with systolic heart failure, CKD stage 3, CAD, HTN, and dyslipidemia. Reported patient not being himself for the last 2 weeks.  On his initial physical examination his blood pressure was 100/63, pulse rate 78, respiratory rate 24, temperature 97.7, oxygen saturation 98%, his lungs were clear to auscultation bilaterally, heart S1-S2 present and rhythmic, abdomen was soft nontender, he had trace bilateral lower extremity edema.  37, potassium 3.8, chloride 101, bicarb 16, glucose 119, BUN 91, creatinine 3.7, white count 9.7, hemoglobin 10.7, hematocrit 34.7, platelets 162, urinalysis negative for infection.  Head CT with no acute changes.  Chest x-ray, increased lung markings bilaterally, right base atelectasis.  EKG 74 bpm, 100% atrial paced, ventricular paced, unchanged from May 2019.   Patient was admitted to the hospital with a working diagnosis of community-acquired pneumonia.  Assessment & Plan:   Principal Problem:   CAP (community acquired pneumonia) Active Problems:   Chronic systolic CHF (congestive heart failure) (HCC)   Elevated troponin   AKI (acute kidney injury) ([place])   CKD (chronic kidney disease) stage 4, GFR 15-29 ml/min ([place])   1. Possible right lower lobe pneumonia. Chest film personally reviewed with no frank infiltrate, will repeat 2 view chest film today. For now will continue antibiotic therapy with IV ceftriaxone and will change azithromycin to po. Continue oxymetry monitoring and as needed supplemental 02 per Marcus [name]. Follow on cell count, temperature curve and cultures. Covid test negative.   2. Acute on chronic systolic heart  failure. Will resume diuresis with torsemide, continue blood pressure monitoring.   3. AKI on CKD stage 3. His base cr is 3,3, on admission at 3,7, now at 3,6. Suspected hypervolemia today, will resume torsemide and will add sodium bicarbonate for his non gap metabolic acidosis.   4. Atrial fibrillation, paroxysmal. Currently patient on a paced rhythm, will continue telemetry monitoring, rate control with amiodarone and will continue anticoagulation with apixaban.   5. Elevated troponin, possible due to acute on chronic CHF. Continue to trend enzymes, patient is chest pain free. Out of bed as tolerated.      DVT prophylaxis: apixaban   Code Status: Dnr Family Communication: no family at the bedside  Disposition Plan/ discharge barriers: pending clinical improvement.   Body mass index is 25.23 kg/m. Malnutrition Type:      Malnutrition Characteristics:      Nutrition Interventions:     RN Pressure Injury Documentation:     Consultants:      Procedures:     Antimicrobials:   Ceftriaxone  Azithromycin.     Subjective: Patient is feeling better, his dyspnea has improved but is not yet back to baseline, no nausea or vomiting.   Objective: Vitals:   09/23/18 0150 09/23/18 0345 09/23/18 0400 09/23/18 0813  BP: 121/84 100/63 101/65   Pulse: 76 77 78   Resp: 18 (!) 24 19   Temp: 97.7 F (36.5 C) 100 F (37.8 C)    TempSrc: Oral Oral    SpO2: 100% 98% 100% 97%  Weight: 77.5 kg     Height: 5\' 9"  (1.753 m)       Intake/Output Summary (Last 24 hours) at 09/23/2018 0820 Last data  filed at 09/23/2018 0600 Gross per 24 hour  Intake 100 ml  Output 150 ml  Net -50 ml   Filed Weights   09/22/18 1855 09/23/18 0150  Weight: 74.8 kg 77.5 kg    Examination:   General: deconditioned  Neurology: Awake and alert, non focal  E ENT: mild pallor, no icterus, oral mucosa moist Cardiovascular: No JVD. S1-S2 present, rhythmic, no gallops, rubs, or murmurs. No lower  extremity edema. Pulmonary: positive breath sounds bilaterally, no wheezing, or rhonchi no significant rales. Gastrointestinal. Abdomen with no organomegaly, non tender, no rebound or guarding Skin. No rashes Musculoskeletal: no joint deformities     Data Reviewed: I have personally reviewed following labs and imaging studies  CBC: Recent Labs  Lab 09/22/18 1857  WBC 9.7  HGB 10.7*  HCT 34.7*  MCV 96.7  PLT 937   Basic Metabolic Panel: Recent Labs  Lab 09/22/18 1933 09/23/18 0209  NA 137 138  K 3.8 4.1  CL 101 102  CO2 16* 17*  GLUCOSE 119* 119*  BUN 91* 91*  CREATININE 3.77* 3.66*  CALCIUM 9.3 9.0  PHOS  --  4.0   GFR: Estimated Creatinine Clearance: 14.2 mL/min (A) (by C-G formula based on SCr of 3.66 mg/dL (H)). Liver Function Tests: Recent Labs  Lab 09/22/18 1933 09/23/18 0209  AST 20  --   ALT 13  --   ALKPHOS 66  --   BILITOT 1.1  --   PROT 7.9  --   ALBUMIN 3.5 3.2*   No results for input(s): LIPASE, AMYLASE in the last 168 hours. No results for input(s): AMMONIA in the last 168 hours. Coagulation Profile: No results for input(s): INR, PROTIME in the last 168 hours. Cardiac Enzymes: Recent Labs  Lab 09/22/18 1857 09/23/18 0209 09/23/18 0711  TROPONINI 0.08* 0.10* 0.12*   BNP (last 3 results) No results for input(s): PROBNP in the last 8760 hours. HbA1C: No results for input(s): HGBA1C in the last 72 hours. CBG: No results for input(s): GLUCAP in the last 168 hours. Lipid Profile: No results for input(s): CHOL, HDL, LDLCALC, TRIG, CHOLHDL, LDLDIRECT in the last 72 hours. Thyroid Function Tests: No results for input(s): TSH, T4TOTAL, FREET4, T3FREE, THYROIDAB in the last 72 hours. Anemia Panel: Recent Labs    09/23/18 0209  VITAMINB12 773  FOLATE 14.2  FERRITIN 216  TIBC 315  IRON 14*  RETICCTPCT 0.8      Radiology Studies: I have reviewed all of the imaging during this hospital visit personally     Scheduled Meds: .  amiodarone  100 mg Oral Daily  . apixaban  2.5 mg Oral BID   Continuous Infusions: . azithromycin 500 mg (09/23/18 0330)  . cefTRIAXone (ROCEPHIN)  IV Stopped (09/23/18 0258)     LOS: 0 days        Marcus Kwiecinski Gerome Apley, MD

## 2018-09-23 NOTE — Telephone Encounter (Signed)
[facility] called the after hours line and sent patient to the ED at 5:32pm yesterday. Pt admitted.

## 2018-09-23 NOTE — Consult Note (Signed)
Cardiology Consultation:   Patient ID: MONTRELLE EDDINGS MRN: [id number]; DOB: April 08, 1929  Admit date: 09/22/2018 Date of Consult: 09/23/2018  Primary Care Provider: Patient, No Pcp Per Primary Cardiologist: Dr. Aundra Dubin Primary Electrophysiologist:  None    Patient Profile:   CHUKWUDI EWEN is a 85 y.o. male with a hx of ICM (35%), CAD and paroxysmal A. fib who is being seen today for the evaluation of AMS.  History of Present Illness:   Mr. Kinker states that he has been feeling unwell over the weekend. When he stands up, he feels "wobbly" and unsteady on his feet. He states that he feels fine lying down, but is also having some troubles with his memories. He denies any lightheadedness or dizziness and states that it is more weakness. He denies any chest pain, palpitations, shortness of breath, lower extremity edema, abdominal pain, diarrhea or constipation. He states that he has been compliant with his home medications.  Past Medical History:  Diagnosis Date   Atrial flutter ([place])    a. Dx 07/2014, s/p TEE/DCCV.   Cataracts, bilateral    Chronic systolic CHF (congestive heart failure) ([place])    a. s/p CRT-D 09/2013. b. Device explant with temp perm then subsequent CRT-P 07/2014.   CKD (chronic kidney disease), stage III (HCC)    Coronary artery disease    a. s/p remote CABG x 2(VG->OM, LIMA->LAD;  b. Late 90's s/p PCI of RCA;  c. 12/09 Cath/PCI: LM 61m, 70-80d (3.0x46mm Xience DES), LAD100p, LCX 80-90p (2.25x11mmTaxus Atom DES), RCA 100d (2.5x42mm Xience DES), VG->OM 100, LIMA->LAD nl, EF 30-35%.   DVT (deep venous thrombosis) ([place]) 2017   Right knee   Hard of hearing    History of colon polyps    History of gastric ulcer    History of gout    History of shingles    Hyperlipidemia    Hypertension    Ischemic cardiomyopathy    a. EF 35-40% by echo 05/2012, b. EF 20-25%, akinesis and scarring of inferolateral, inferior and inferoseptal myocardium, mild AI, mod MR, LA  mod dilated, RV mildly dialted and sys fx mildly reduced, RA mildly dilated (09/2013)  c. EF 35-40% by 2D in 07/2014, 25-30% by TEE.   Macular degeneration, dry    Pacemaker infection ([place])    PAF (paroxysmal atrial fibrillation) ([place])    Peripheral vascular disease ([place])    Presence of permanent cardiac pacemaker 07/23/2014   [facility] #[id number], Serial [id number]   Prostate cancer [facility])    Symptomatic bradycardia    a. 08/2003 s/p MDT Enpulse [id number] Dual chamber PPM ser # [id number] H. b. Device explant due to infection/temp perm insertion/PPM 07/2014.    Past Surgical History:  Procedure Laterality Date   ANGIOPLASTY     stent placement   APPENDECTOMY     BI-VENTRICULAR PACEMAKER INSERTION [name] 07/23/2014   Procedure: BI-VENTRICULAR PACEMAKER INSERTION (CRT-P);  Surgeon: Deboraha Sprang, MD;  Location: [facility] CATH LAB;  Service: Cardiovascular;  Laterality: [name];   BI-VENTRICULAR PACEMAKER UPGRADE [name] 10/08/2013   Procedure: BI-VENTRICULAR PACEMAKER UPGRADE;  Surgeon: [name], MD;  Location: [facility] CATH LAB;  Service: Cardiovascular;  Laterality: [name];   CARDIAC CATHETERIZATION  most recent in 2009   several   CARDIAC CATHETERIZATION Right 07/19/2014   Procedure: TEMPORARY PACEMAKER;  Surgeon: [name], MD;  Location: [place];  Service: Cardiovascular;  Laterality: Right;   CARDIAC CATHETERIZATION [name] 03/01/2016   Procedure: Right Heart Cath;  Surgeon: [name],  MD;  Location: [place] CV LAB;  Service: Cardiovascular;  Laterality: [name];   CARDIOVERSION  06/26/2012   Procedure: CARDIOVERSION;  Surgeon: Lelon Perla, MD;  Location: [facility] ENDOSCOPY;  Service: Cardiovascular;  Laterality: [name];   CARDIOVERSION [name] 07/27/2014   Procedure: CARDIOVERSION;  Surgeon: [name], MD;  Location: [place];  Service: Cardiovascular;  Laterality: [name];   CATARACT EXTRACTION W/PHACO Left 09/24/2016   Procedure: CATARACT EXTRACTION PHACO AND INTRAOCULAR LENS PLACEMENT  ([name])  Left;  Surgeon: Leandrew Koyanagi, MD;  Location: [place];  Service: Ophthalmology;  Laterality: Left;   CATARACT EXTRACTION W/PHACO Right 10/17/2016   Procedure: CATARACT EXTRACTION PHACO AND INTRAOCULAR LENS PLACEMENT ([place])  Right;  Surgeon: Leandrew Koyanagi, MD;  Location: [place];  Service: Ophthalmology;  Laterality: Right;  prefers mid to late morning arrival   COLONOSCOPY     CORONARY ANGIOPLASTY     couple    CORONARY ARTERY BYPASS GRAFT  1990   left main   DOPPLER ECHOCARDIOGRAPHY  06/24/2002   EF 60-65%   EMBOLECTOMY Right 07/28/2013   Procedure: Right Popliteal and Tibial Embolectomy;  Surgeon: Angelia Mould, MD;  Location: [place];  Service: Vascular;  Laterality: Right;   INSERT / REPLACE / REMOVE PACEMAKER     1st pacemaker placed 10+yrs ago   PACEMAKER LEAD REMOVAL Right 07/19/2014   Procedure: PACEMAKER LEAD REMOVAL/EXTRACTION;  Surgeon: [name], MD;  Location: [place];  Service: Cardiovascular;  Laterality: Right;  DR. Lucianne Lei TRIGT TO BACK UP CASE   PERMANENT PACEMAKER GENERATOR CHANGE [name] 08/27/2012   Procedure: PERMANENT PACEMAKER GENERATOR CHANGE;  Surgeon: [name], MD;  Location: [facility] CATH LAB;  Service: Cardiovascular;  Laterality: [name];   TEE WITHOUT CARDIOVERSION  06/26/2012   Procedure: TRANSESOPHAGEAL ECHOCARDIOGRAM (TEE);  Surgeon: Lelon Perla, MD;  Location: [facility] ENDOSCOPY;  Service: Cardiovascular;  Laterality: [name];   TEE WITHOUT CARDIOVERSION [name] 07/27/2014   Procedure: TRANSESOPHAGEAL ECHOCARDIOGRAM (TEE);  Surgeon: [name], MD;  Location: [place];  Service: Cardiovascular;  Laterality: [name];     Home Medications:  Prior to Admission medications   Medication Sig Start Date End Date Taking? Authorizing Provider  amiodarone (PACERONE) 200 MG tablet TAKE 1/2 TABLET (100MG      TOTAL) DAILY 08/27/18   [name], MD  carvedilol (COREG) 6.25 MG tablet TAKE 1 TABLET BY MOUTH TWICE A DAY WITH MEALS  07/01/18   [name], MD  Coenzyme Q10 (CO Q 10) 100 MG CAPS Take 1 capsule by mouth 2 (two) times daily.    [provider]  ELIQUIS 2.5 MG TABS tablet TAKE 1 TABLET TWICE A DAY 08/27/18   [name], MD  torsemide (DEMADEX) 20 MG tablet Take 2 tablets (40 mg total) by mouth daily. 08/22/18   [name], MD    Inpatient Medications: Scheduled Meds:  Continuous Infusions:  PRN Meds:   Allergies:   No Known Allergies  Social History:   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 file   Food insecurity:    Worry: Not on file    Inability: Not on file   Transportation needs:    Medical: Not on  file    Non-medical: Not on file  Tobacco Use   Smoking status: Former Smoker    Last attempt to quit: 02/10/1971    Years since quitting: 47.6   Smokeless tobacco: Never Used   Tobacco comment: quit smoking 25yrs ago  Substance and Sexual Activity   Alcohol use: No    Comment: nothing in 57yrs    Drug use: No   Sexual activity: Never  Lifestyle   Physical activity:    Days per week: Not on file    Minutes per session: Not on file   Stress: Not on file  Relationships   Social connections:    Talks on phone: Not on file    Gets together: Not on file    Attends religious service: Not on file    Active member of club or organization: Not on file    Attends meetings of clubs or organizations: Not on file    Relationship status: Not on file   Intimate partner violence:    Fear of current or ex partner: Not on file    Emotionally abused: Not on file    Physically abused: Not on file    Forced sexual activity: Not on file  Other Topics Concern   Not on file  Social History Narrative   Lives in  [place] with wife.  Retired.    Family History:   Family History  Problem Relation Age of Onset   Hypertension Mother    Colon cancer Mother    Cancer Mother        Colon   Alcohol abuse Father      ROS:  Please see the history of present illness.  All other ROS reviewed and negative.     Physical Exam/Data:   Vitals:   09/22/18 2200 09/22/18 2215 09/22/18 2230 09/22/18 2245  BP: 95/68 97/64 91/61  94/64  Pulse: 69 74 72 75  Resp: 19 15 20  (!) 26  Temp:      TempSrc:      SpO2: 100% 100% 100% 98%  Weight:      Height:       No intake or output data in the 24 hours ending 09/23/18 0041 Last 3 Weights 09/22/2018 07/16/2018 07/01/2018  Weight (lbs) 165 lb 180 lb 9.6 oz 180 lb 9.6 oz  Weight (kg) 74.844 kg 81.92 kg 81.92 kg     Body mass index is 25.09 kg/m.  General: Elderly man, lying comfortably in bed  HEENT: normal Lymph: no adenopathy Neck: JVP to the midneck when lying at 30 degrees  Cardiac:  normal S1, S2; RRR; no murmurs, rubs or gallops Lungs:  clear to auscultation bilaterally, no wheezing, rhonchi or rales. Breathing comfortably on room air Abd: soft, nontender, no hepatomegaly  Ext: Trace lower extremity edema  Skin: warm and dry  Psych:  Normal affect   EKG:  The EKG was personally reviewed and demonstrates:  A-sensed, bi-V paced    Relevant CV Studies: Echo January 2019 - Left ventricle: The cavity size was moderately dilated. Systolic   function was moderately reduced. The estimated ejection fraction   was in the range of 35% to 40%. There is akinesis of the   inferolateral and inferior myocardium. Doppler parameters are   consistent with a reversible restrictive pattern, indicative of   decreased left ventricular diastolic compliance and/or increased   left atrial pressure (grade 3 diastolic dysfunction). - Aortic valve: Trileaflet; moderately thickened, moderately   calcified leaflets. - Mitral valve:  Mildly thickened leaflets . There was  moderate to   severe regurgitation. - Left atrium: The atrium was mildly dilated.  Laboratory Data:  Chemistry Recent Labs  Lab 09/22/18 1933  NA 137  K 3.8  CL 101  CO2 16*  GLUCOSE 119*  BUN 91*  CREATININE 3.77*  CALCIUM 9.3  GFRNONAA 14*  GFRAA 16*  ANIONGAP 20*    Recent Labs  Lab 09/22/18 1933  PROT 7.9  ALBUMIN 3.5  AST 20  ALT 13  ALKPHOS 66  BILITOT 1.1   Hematology Recent Labs  Lab 09/22/18 1857  WBC 9.7  RBC 3.59*  HGB 10.7*  HCT 34.7*  MCV 96.7  MCH 29.8  MCHC 30.8  RDW 14.7  PLT 162   Cardiac Enzymes Recent Labs  Lab 09/22/18 1857  TROPONINI 0.08*   No results for input(s): TROPIPOC in the last 168 hours.  BNP Recent Labs  Lab 09/22/18 1857  BNP 1,259.5*    DDimer No results for input(s): DDIMER in the last 168 hours.  Radiology/Studies:  Ct Head Wo Contrast  Result Date: 09/22/2018 CLINICAL DATA:  Altered level of consciousness. Weakness. EXAM: CT HEAD WITHOUT CONTRAST TECHNIQUE: Contiguous axial images were obtained from the base of the skull through the vertex without intravenous contrast. COMPARISON:  None. FINDINGS: Brain: There is no evidence of acute infarct, intracranial hemorrhage, mass, midline shift, or extra-axial fluid collection. There is mild cerebral atrophy. Cerebral white matter hypodensities are nonspecific but compatible with mild chronic small vessel ischemic disease. A left choroidal fissure cyst is noted. Vascular: Calcified atherosclerosis at the skull base. No suspicious vascular hyperdensity. Skull: No fracture or focal osseous lesion. Sinuses/Orbits: Paranasal sinuses and mastoid air cells are clear. Bilateral cataract extraction. Other: None. IMPRESSION: 1. No evidence of acute intracranial abnormality. 2. Mild chronic small vessel ischemic disease. Electronically Signed   By: Logan Bores M.D.   On: 09/22/2018 21:48   Dg Chest Portable 1 View  Result Date: 09/22/2018 CLINICAL DATA:  Altered mental status EXAM:  PORTABLE CHEST 1 VIEW COMPARISON:  08/04/2014 FINDINGS: Cardiac shadow is mildly enlarged but stable. Postsurgical changes and pacing device are again seen and stable. Chronic scarring in the left base is noted. Mild increased density is noted in the right hemithorax consistent with small posterior effusion. Mild right basilar infiltrate is noted as well. No focal confluent infiltrate is seen. No bony abnormality is noted. IMPRESSION: Small right pleural effusion with mild basilar infiltrate Chronic changes in the right base. Electronically Signed   By: Inez Catalina M.D.   On: 09/22/2018 21:42    Assessment and Plan:   NICKALUS THORNSBERRY is a 85 y.o. male with a hx of ICM (35%), CAD and paroxysmal A. fib who is being seen today for the evaluation of AMS. He appears euvolemic on exam with clear lung sounds and trace lower extremity edema. He denies any cardiac symptoms. His AMS is concerning and he is unsafe to go home due to his unsteadiness and weakness. Agree with admission to medicine for work-up of his AMS.   - Continue home amiodarone and apixaban - Ok to hold coreg for hypotension - Appears euvolemic on exam, continue home torsemide  - Cardiology will continue to follow along      For questions or updates, please contact [place] Please consult [email or web address] for contact info under     Signed, Princella Pellegrini, MD  09/23/2018 12:41 AM

## 2018-09-23 NOTE — Telephone Encounter (Signed)
Unable to leave message for patient to remind of missed remote transmission.

## 2018-09-23 NOTE — Plan of Care (Signed)

## 2018-09-23 NOTE — Progress Notes (Signed)
Wife called in; expresses concern over patient's cognition.  States this change in cognition is new; however, she does report that patient questioned to her last Friday if she thought he was getting dementia.  Wife states patient sits in his chair all day at home and does nothing, where previously he may help do a load of laundry or take the trash out.  She works during the day and states she has been home for the last approx six weeks d/t COVID and its the first time she has observed him all day every day.  She has been surprised at his lack of activity.  Wife spoke w/PT earlier this morning and is aware they are recommending 24/7 supervision.  She is however hopeful that patient cognition will change and he will no longer be confused.  At present, patient is oriented to self but disoriented to time, place, and situation.  He does not realize he is in the hospital and certainly doesn't know why he's here.  Patient has been pleasantly confused for all staff interactions today.

## 2018-09-23 NOTE — Progress Notes (Signed)
Patient ID: Marcus [name], male   DOB: Apr 02, 1929, 85 y.o.   MRN: [id number]     Advanced Heart Failure Rounding Note  PCP-Cardiologist: No primary care provider on file.   Subjective:    Patient awake/alert. He feels weak but is not short of breath.  SBP in 90s-100s which is not far off his baseline.  BUN 91, creatinine 3.66 today (mildly higher than baseline).  Tm 100 yesterday, currently afebrile.   CXR with possible right base infiltrate, COVID-19 negative. PCT 0.94.   Objective:   Weight Range: 77.5 kg Body mass index is 25.23 kg/m.   Vital Signs:   Temp:  [97.7 F (36.5 C)-100 F (37.8 C)] 100 F (37.8 C) (05/05 0345) Pulse Rate:  [69-88] 78 (05/05 0400) Resp:  [15-30] 19 (05/05 0400) BP: (86-121)/(55-84) 101/65 (05/05 0400) SpO2:  [94 %-100 %] 97 % (05/05 0813) Weight:  [74.8 kg-77.5 kg] 77.5 kg (05/05 0150)    Weight change: Filed Weights   09/22/18 1855 09/23/18 0150  Weight: 74.8 kg 77.5 kg    Intake/Output:   Intake/Output Summary (Last 24 hours) at 09/23/2018 0912 Last data filed at 09/23/2018 0600 Gross per 24 hour  Intake 100 ml  Output 150 ml  Net -50 ml      Physical Exam    General:  Well appearing. No resp difficulty HEENT: Normal Neck: Supple. JVP not elevated. Carotids 2+ bilat; no bruits. No lymphadenopathy or thyromegaly appreciated. Cor: PMI lateral. Regular rate & rhythm. No rubs, gallops or murmurs. Lungs: Clear Abdomen: Soft, nontender, nondistended. No hepatosplenomegaly. No bruits or masses. Good bowel sounds. Extremities: No cyanosis, clubbing, rash, edema Neuro: Alert & orientedx3, cranial nerves grossly intact. moves all 4 extremities w/o difficulty. Affect pleasant   Telemetry   A-V sequential pacing (personally reviewed).   Labs    CBC Recent Labs    09/22/18 1857  WBC 9.7  HGB 10.7*  HCT 34.7*  MCV 96.7  PLT 144   Basic Metabolic Panel Recent Labs    09/22/18 1933 09/23/18 0209  NA 137 138  K 3.8 4.1   CL 101 102  CO2 16* 17*  GLUCOSE 119* 119*  BUN 91* 91*  CREATININE 3.77* 3.66*  CALCIUM 9.3 9.0  PHOS  --  4.0   Liver Function Tests Recent Labs    09/22/18 1933 09/23/18 0209  AST 20  --   ALT 13  --   ALKPHOS 66  --   BILITOT 1.1  --   PROT 7.9  --   ALBUMIN 3.5 3.2*   No results for input(s): LIPASE, AMYLASE in the last 72 hours. Cardiac Enzymes Recent Labs    09/22/18 1857 09/23/18 0209 09/23/18 0711  TROPONINI 0.08* 0.10* 0.12*    BNP: BNP (last 3 results) Recent Labs    06/25/18 1209 09/22/18 1857  BNP 800.3* 1,259.5*    ProBNP (last 3 results) No results for input(s): PROBNP in the last 8760 hours.   D-Dimer No results for input(s): DDIMER in the last 72 hours. Hemoglobin A1C No results for input(s): HGBA1C in the last 72 hours. Fasting Lipid Panel No results for input(s): CHOL, HDL, LDLCALC, TRIG, CHOLHDL, LDLDIRECT in the last 72 hours. Thyroid Function Tests No results for input(s): TSH, T4TOTAL, T3FREE, THYROIDAB in the last 72 hours.  Invalid input(s): FREET3  Other results:   Imaging    Ct Head Wo Contrast  Result Date: 09/22/2018 CLINICAL DATA:  Altered level of consciousness. Weakness. EXAM: CT HEAD WITHOUT  CONTRAST TECHNIQUE: Contiguous axial images were obtained from the base of the skull through the vertex without intravenous contrast. COMPARISON:  None. FINDINGS: Brain: There is no evidence of acute infarct, intracranial hemorrhage, mass, midline shift, or extra-axial fluid collection. There is mild cerebral atrophy. Cerebral white matter hypodensities are nonspecific but compatible with mild chronic small vessel ischemic disease. A left choroidal fissure cyst is noted. Vascular: Calcified atherosclerosis at the skull base. No suspicious vascular hyperdensity. Skull: No fracture or focal osseous lesion. Sinuses/Orbits: Paranasal sinuses and mastoid air cells are clear. Bilateral cataract extraction. Other: None. IMPRESSION: 1. No  evidence of acute intracranial abnormality. 2. Mild chronic small vessel ischemic disease. Electronically Signed   By: Logan Bores M.D.   On: 09/22/2018 21:48   US Renal  Result Date: 09/23/2018 CLINICAL DATA:  Acute kidney injury EXAM: RENAL / URINARY TRACT ULTRASOUND COMPLETE COMPARISON:  07/18/2018 FINDINGS: Right Kidney: Renal measurements: 9.9 x 4.3 x 4.9 cm = volume: 109 mL. Cortical thinning and increased echotexture. No mass or hydronephrosis. Left Kidney: Renal measurements: 9.9 x 3.7 x 4.3 cm = volume: 82 mL. Cortical thinning with increased echotexture. No mass or hydronephrosis. Bladder: Appears normal for degree of bladder distention. IMPRESSION: Stable cortical thinning and increased echotexture compatible with chronic medical renal disease. No hydronephrosis or acute findings. Electronically Signed   By: Rolm Baptise M.D.   On: 09/23/2018 03:25   Dg Chest Portable 1 View  Result Date: 09/22/2018 CLINICAL DATA:  Altered mental status EXAM: PORTABLE CHEST 1 VIEW COMPARISON:  08/04/2014 FINDINGS: Cardiac shadow is mildly enlarged but stable. Postsurgical changes and pacing device are again seen and stable. Chronic scarring in the left base is noted. Mild increased density is noted in the right hemithorax consistent with small posterior effusion. Mild right basilar infiltrate is noted as well. No focal confluent infiltrate is seen. No bony abnormality is noted. IMPRESSION: Small right pleural effusion with mild basilar infiltrate Chronic changes in the right base. Electronically Signed   By: Inez Catalina M.D.   On: 09/22/2018 21:42      Medications:     Scheduled Medications: . amiodarone  100 mg Oral Daily  . apixaban  2.5 mg Oral BID  . [START ON 09/24/2018] azithromycin  500 mg Oral Daily     Infusions: . cefTRIAXone (ROCEPHIN)  IV Stopped (09/23/18 0258)     PRN Medications:  acetaminophen **OR** acetaminophen    Assessment/Plan   1. Weakness/lethargy/confusion: This  may be due to infection, most likely community-acquired PNA.  He has a right base infiltrate on CXR, PCT somewhat elevated at 0.92.  Tm 100 yesterday, afebrile today.  COVID-19 negative.  - Agree with ceftriaxone/azithromycin.  - PT consult, out of bed.  2. Chronic systolic IHK:[id number] cardiomyopathy. Echo in 1/19 showed EF 35-40% (stable from most recent prior). Has St Jude CRT-P device. On device check on 08/06/18, he was appropriately BiV pacing with extremely rare AF, thoracic impedance was low.  I recently increased his torsemide to 40 mg daily for volume overload.  Per his wife, he has been losing weigh.  On exam, he is not particularly volume overloaded.  BNP is elevated but he has CKD stage IV as well.  - Would hold torsemide today, possibly resume home dose tomorrow if BP and creatinine stable.  - Hold Coreg today with soft BP, restart tomorrow if BP remains stable (may start back on lower dose to begin with).  - Off spiro with increased creatinine and potassium. -  Not on ACEI/ARB/ARNI with CKD. 3. Atrial fibrillation: Paroxysmal. <1% atrial fibrillation on recent device check.  - Continue amiodarone 100 mg daily. - He is on Eliquis 2.5 mg bid (age, elevated creatinine).  4. CAD:H/o CABG.  No chest pain.Mild troponin elevation, no trend.  - Suspect demand ischemia in setting of hypotension/infection and CKD stage IV.  -Refuses statin. - He is not on ASA given use of Eliquis and stable CAD.  5. Sick sinus syndrome: s/p CRT-P.No change. 6. AKI on CKD, stage IV:BUN/creatinine up mildly from baseline (creatinine 3.5 in 2/20).  Follow closely.  7. Altered mental status: Suspect delirium in setting of possible PNA.  This morning, he is clear when talking with me.  CT head with no acute changes.   Length of Stay: 0  [name], MD  09/23/2018, 9:12 AM  Advanced Heart Failure Team Pager [phone] (M-F; 7a - 4p)  Please contact [facility] for night-coverage after hours  (4p -7a ) and weekends on [email or web address]

## 2018-09-23 NOTE — Evaluation (Signed)
Physical Therapy Evaluation Patient Details Name: Marcus [name] MRN: [id number] DOB: [birth year] Today's Date: 09/23/2018   History of Present Illness   Marcus [name] is a 85 y.o. male with medical history significant of paroxysmal atrial fibrillation, sick sinus syndrome status post PPM, ischemic cardiomyopathy with EF 35 to 40%, CKD stage III, CAD status post CABG and PCIs, DVT, hypertension, hyperlipidemia, peripheral vascular disease, and conditions listed below presenting to the hospital for evaluation of altered mental status and generalized weakness.  Clinical Impression  Pt admitted with above. Pt was indep PTA, lived with spouse. Pt now confused with impaired sequencing, processing, decreased insight to deficits, and impulsive requiring minA for all mobility. Spoke with wife on phone and she stated this is not like him. She typically works during the day but is now available 24/l7 due to COVID-19. Acute PT to cont to follow.    Follow Up Recommendations Home health PT;Supervision/Assistance - 24 hour(may need to evaluate for SNF if cognition doesn't improve)    Equipment Recommendations  None recommended by PT    Recommendations for Other Services       Precautions / Restrictions Precautions Precautions: Fall Precaution Comments: confusion Restrictions Weight Bearing Restrictions: No      Mobility  Bed Mobility Overal bed mobility: Needs Assistance Bed Mobility: Rolling;Supine to Sit Rolling: Min assist   Supine to sit: Min assist     General bed mobility comments: minA for tactile directional cues to keep patient on task  Transfers Overall transfer level: Needs assistance Equipment used: 1 person hand held assist Transfers: Sit to/from [facility] Sit to Stand: Min assist Stand pivot transfers: Min assist       General transfer comment: minA for tactile cues for safety to complete task, pt searching for a chair that was in his house, pt  required constant re-direction, attempted to ambulate however pt adamantly refused because he wanted to sit in a particular chair he couln't find. pt then instructed to sit in recliner to eat lunch and he obeyed  Ambulation/Gait             General Gait Details: pt refused to ambulate despite multiple attemps  Stairs            Wheelchair Mobility    Modified Rankin (Stroke Patients Only)       Balance Overall balance assessment: Needs assistance Sitting-balance support: Feet supported;No upper extremity supported Sitting balance-Leahy Scale: Fair Sitting balance - Comments: pt with lateral sways L/R   Standing balance support: Single extremity supported Standing balance-Leahy Scale: Fair Standing balance comment: pt with single UE support                             Pertinent Vitals/Pain Pain Assessment: No/denies pain    Home Living Family/patient expects to be discharged to:: Private residence Living Arrangements: Spouse/significant other Available Help at Discharge: Family;Available 24 hours/day(typically wife works, home now due to [employer]) Type of Home: House Home Access: Stairs to enter Entrance Stairs-Rails: None Technical brewer of Steps: 3 Home Layout: One level Home Equipment: Shower seat      Prior Function Level of Independence: Independent         Comments: drives minimally, indep with bathing, dressing and amb     Hand Dominance   Dominant Hand: Right    Extremity/Trunk Assessment   Upper Extremity Assessment Upper Extremity Assessment: Generalized weakness    Lower Extremity Assessment  Lower Extremity Assessment: Generalized weakness    Cervical / Trunk Assessment Cervical / Trunk Assessment: Kyphotic  Communication   Communication: HOH  Cognition Arousal/Alertness: Awake/alert Behavior During Therapy: Restless(trying to climb out o fbed) Overall Cognitive Status: Impaired/Different from baseline Area of  Impairment: Orientation;Memory;Following commands;Safety/judgement;Awareness;Problem solving;Attention                 Orientation Level: Disoriented to;Place;Time;Situation(pt able to state name and birthdate) Current Attention Level: Focused Memory: Decreased short-term memory Following Commands: Follows one step commands with increased time;Follows one step commands inconsistently Safety/Judgement: Decreased awareness of safety;Decreased awareness of deficits Awareness: Emergent Problem Solving: Slow processing;Difficulty sequencing;Requires verbal cues;Requires tactile cues General Comments: despite pt being re-oriented several time pt unable to recall hospital, pt restless trying to crawl out of bed, pt with mild hallucinations      General Comments General comments (skin integrity, edema, etc.): VSS, pt appeared to have mild SOB however pt did not complain to have any    Exercises     Assessment/Plan    PT Assessment Patient needs continued PT services  PT Problem List Decreased strength;Decreased activity tolerance;Decreased balance;Decreased mobility;Decreased cognition;Decreased knowledge of use of DME;Decreased safety awareness       PT Treatment Interventions DME instruction;Stair training;Gait training;Functional mobility training;Therapeutic activities;Therapeutic exercise;Balance training;Neuromuscular re-education;Patient/family education    PT Goals (Current goals can be found in the Care Plan section)  Acute Rehab PT Goals Patient Stated Goal: didn't state PT Goal Formulation: With family Time For Goal Achievement: 10/07/18 Potential to Achieve Goals: Good    Frequency Min 3X/week   Barriers to discharge Decreased caregiver support wife typical works during the day    Co-evaluation               AM-PAC PT "6 Clicks" Mobility  Outcome Measure Help needed turning from your back to your side while in a flat bed without using bedrails?: A  Little Help needed moving from lying on your back to sitting on the side of a flat bed without using bedrails?: A Little Help needed moving to and from a bed to a chair (including a wheelchair)?: A Little Help needed standing up from a chair using your arms (e.g., wheelchair or bedside chair)?: A Little Help needed to walk in hospital room?: A Little Help needed climbing 3-5 steps with a railing? : A Lot 6 Click Score: 17    End of Session Equipment Utilized During Treatment: Gait belt Activity Tolerance: Patient tolerated treatment well Patient left: in chair;with call bell/phone within reach;with chair alarm set Nurse Communication: Mobility status PT Visit Diagnosis: Unsteadiness on feet (R26.81);Muscle weakness (generalized) (M62.81);Difficulty in walking, not elsewhere classified (R26.2)    Time: 1448-1856 PT Time Calculation (min) (ACUTE ONLY): 23 min   Charges:   PT Evaluation $PT Eval Moderate Complexity: 1 Mod          Kittie Plater, PT, DPT Acute Rehabilitation Services Pager #: [phone] Office #: [phone]   Berline Lopes 09/23/2018, 12:34 PM

## 2018-09-23 NOTE — ED Notes (Signed)
ED TO INPATIENT HANDOFF REPORT  ED Nurse Name and Phone #: Jess F [phone]  S Name/Age/Gender [name] 85 y.o. male Room/Bed: 015C/015C  Code Status   Code Status: Prior  Home/SNF/Other Home Patient oriented to: self, place, time and situation Is this baseline? Yes   Triage Complete: Triage complete  Chief Complaint fever/lethargic  Triage Note Pt brought to ED by wife. Pt is from home. Pt has been weak and feverish at home. No fever in triage. Denies cough or being around anyone sick.    Allergies No Known Allergies  Level of Care/Admitting Diagnosis ED Disposition    ED Disposition Condition [facility] Area: [place] [[id number]]  Level of Care: Progressive [102]  Covid Evaluation: [name]  Diagnosis: Hypotension [604540]  Admitting Physician: Shela Leff [[id number]]  Attending Physician: Shela Leff [[id number]]  Estimated length of stay: past midnight tomorrow  Certification:: I certify this patient will need inpatient services for at least 2 midnights  PT Class (Do Not Modify): Inpatient [101]  PT Acc Code (Do Not Modify): Private [1]       B Medical/Surgery History Past Medical History:  Diagnosis Date  . Atrial flutter ([place])    a. Dx 07/2014, s/p TEE/DCCV.  . Cataracts, bilateral   . Chronic systolic CHF (congestive heart failure) ([place])    a. s/p CRT-D 09/2013. b. Device explant with temp perm then subsequent CRT-P 07/2014.  Marland Kitchen CKD (chronic kidney disease), stage III ([place])   . Coronary artery disease    a. s/p remote CABG x 2(VG->OM, LIMA->LAD;  b. Late 90's s/p PCI of RCA;  c. 12/09 Cath/PCI: LM 68m, 70-80d (3.0x60mm Xience DES), LAD100p, LCX 80-90p (2.25x75mmTaxus Atom DES), RCA 100d (2.5x84mm Xience DES), VG->OM 100, LIMA->LAD nl, EF 30-35%.  Marland Kitchen DVT (deep venous thrombosis) ([place]) 2017   Right knee  . Hard of hearing   . History of colon polyps   . History of gastric ulcer   . History of gout   . History of  shingles   . Hyperlipidemia   . Hypertension   . Ischemic cardiomyopathy    a. EF 35-40% by echo 05/2012, b. EF 20-25%, akinesis and scarring of inferolateral, inferior and inferoseptal myocardium, mild AI, mod MR, LA mod dilated, RV mildly dialted and sys fx mildly reduced, RA mildly dilated (09/2013)  c. EF 35-40% by 2D in 07/2014, 25-30% by TEE.  . Macular degeneration, dry   . Pacemaker infection ([place])   . PAF (paroxysmal atrial fibrillation) ([place])   . Peripheral vascular disease ([place])   . Presence of permanent cardiac pacemaker 07/23/2014   [facility] [id number], Serial [id number]  . Prostate cancer ([place])   . Symptomatic bradycardia    a. 08/2003 s/p MDT Enpulse [id number] Dual chamber PPM ser # [id number] H. b. Device explant due to infection/temp perm insertion/PPM 07/2014.   Past Surgical History:  Procedure Laterality Date  . ANGIOPLASTY     stent placement  . APPENDECTOMY    . BI-VENTRICULAR PACEMAKER INSERTION [name] 07/23/2014   Procedure: BI-VENTRICULAR PACEMAKER INSERTION (CRT-P);  Surgeon: Deboraha Sprang, MD;  Location[facility] CATH LAB;  Service: Cardiovascular;  Laterality: [name];  . BI-VENTRICULAR PACEMAKER UPGRADE [name] 10/08/2013   Procedure: BI-VENTRICULAR PACEMAKER UPGRADE;  Surgeon: [name], MD;  Location: [facility] CATH LAB;  Service: Cardiovascular;  Laterality: [name];  . CARDIAC CATHETERIZATION  most recent in 2009   several  . CARDIAC CATHETERIZATION Right 07/19/2014   Procedure: TEMPORARY  PACEMAKER;  Surgeon: [name], MD;  Location: [place];  Service: Cardiovascular;  Laterality: Right;  . CARDIAC CATHETERIZATION [name] 03/01/2016   Procedure: Right Heart Cath;  Surgeon: [name], MD;  Location: [place] CV LAB;  Service: Cardiovascular;  Laterality: [name];  . CARDIOVERSION  06/26/2012   Procedure: CARDIOVERSION;  Surgeon: Lelon Perla, MD;  Location: [facility] ENDOSCOPY;  Service: Cardiovascular;  Laterality: [name];  . CARDIOVERSION [name] 07/27/2014   Procedure: CARDIOVERSION;   Surgeon: [name], MD;  Location: [place];  Service: Cardiovascular;  Laterality: [name];  . CATARACT EXTRACTION W/PHACO Left 09/24/2016   Procedure: CATARACT EXTRACTION PHACO AND INTRAOCULAR LENS PLACEMENT ([place])  Left;  Surgeon: Leandrew Koyanagi, MD;  Location: [place];  Service: Ophthalmology;  Laterality: Left;  . CATARACT EXTRACTION W/PHACO Right 10/17/2016   Procedure: CATARACT EXTRACTION PHACO AND INTRAOCULAR LENS PLACEMENT ([place])  Right;  Surgeon: Leandrew Koyanagi, MD;  Location: [place];  Service: Ophthalmology;  Laterality: Right;  prefers mid to late morning arrival  . COLONOSCOPY    . CORONARY ANGIOPLASTY     couple   . CORONARY ARTERY BYPASS GRAFT  1990   left main  . DOPPLER ECHOCARDIOGRAPHY  06/24/2002   EF 60-65%  . EMBOLECTOMY Right 07/28/2013   Procedure: Right Popliteal and Tibial Embolectomy;  Surgeon: Angelia Mould, MD;  Location: [place];  Service: Vascular;  Laterality: Right;  . INSERT / REPLACE / REMOVE PACEMAKER     1st pacemaker placed 10+yrs ago  . PACEMAKER LEAD REMOVAL Right 07/19/2014   Procedure: PACEMAKER LEAD REMOVAL/EXTRACTION;  Surgeon: [name], MD;  Location: [place];  Service: Cardiovascular;  Laterality: Right;  DR. Lucianne Lei TRIGT TO BACK UP CASE  . PERMANENT PACEMAKER GENERATOR CHANGE [name] 08/27/2012   Procedure: PERMANENT PACEMAKER GENERATOR CHANGE;  Surgeon: [name], MD;  Location: [facility] CATH LAB;  Service: Cardiovascular;  Laterality: [name];  . TEE WITHOUT CARDIOVERSION  06/26/2012   Procedure: TRANSESOPHAGEAL ECHOCARDIOGRAM (TEE);  Surgeon: Lelon Perla, MD;  Location: [facility] ENDOSCOPY;  Service: Cardiovascular;  Laterality: [name];  . TEE WITHOUT CARDIOVERSION [name] 07/27/2014   Procedure: TRANSESOPHAGEAL ECHOCARDIOGRAM (TEE);  Surgeon: [name], MD;  Location: [place];  Service: Cardiovascular;  Laterality: [name];     A IV Location/Drains/Wounds Patient Lines/Drains/Airways Status   Active  Line/Drains/Airways    Name:   Placement date:   Placement time:   Site:   Days:   Peripheral IV 09/22/18 Left Hand   09/22/18    2332    Hand   1   Post Cath / Sheath 03/01/16 Right Venous;Brachial   03/01/16    1343    Venous;Brachial   936   Incision (Closed) 07/28/13 Leg Right   07/28/13    2316     1883   Incision (Closed) 07/29/13 Leg Right   07/29/13    0020     1882   Incision (Closed) 10/08/13 Chest Right   10/08/13    2000     1811   Incision (Closed) 07/19/14 Groin Right   07/19/14    1055     1527   Incision (Closed) 07/19/14 Chest Right   07/19/14    1055     1527   Incision (Closed) 07/23/14 Chest Left;Anterior   07/23/14    1545     1523   Incision (Closed) 09/24/16 Eye Left   09/24/16    1000     729   Incision (  Closed) 10/17/16 Eye Right   10/17/16    1253     706          Intake/Output Last 24 hours No intake or output data in the 24 hours ending 09/23/18 0130  Labs/Imaging Results for orders placed or performed during the hospital encounter of 09/22/18 (from the past 48 hour(s))  CBC     Status: Abnormal   Collection Time: 09/22/18  6:57 PM  Result Value Ref Range   WBC 9.7 4.0 - 10.5 K/uL   RBC 3.59 (L) 4.22 - 5.81 MIL/uL   Hemoglobin 10.7 (L) 13.0 - 17.0 g/dL   HCT 34.7 (L) 39.0 - 52.0 %   MCV 96.7 80.0 - 100.0 fL   MCH 29.8 26.0 - 34.0 pg   MCHC 30.8 30.0 - 36.0 g/dL   RDW 14.7 11.5 - 15.5 %   Platelets 162 150 - 400 K/uL   nRBC 0.0 0.0 - 0.2 %    Comment: Performed at [facility], [place] [address] [place], [place] [address]  Brain natriuretic peptide     Status: Abnormal   Collection Time: 09/22/18  6:57 PM  Result Value Ref Range   B Natriuretic Peptide 1,259.5 (H) 0.0 - 100.0 pg/mL    Comment: Performed at [facility] [address] [place], [place] [address]  Troponin I - ONCE - STAT     Status: Abnormal   Collection Time: 09/22/18  6:57 PM  Result Value Ref Range   Troponin I 0.08 (HH) <0.03 ng/mL    Comment: CRITICAL RESULT  CALLED TO, READ BACK BY AND VERIFIED WITH: T MORRIS,RN 2004 09/22/2018 WBOND Performed at [facility], [place] [address], [place], [place] [address]   Urinalysis, Routine w reflex microscopic     Status: None   Collection Time: 09/22/18  7:32 PM  Result Value Ref Range   Color, Urine YELLOW YELLOW   APPearance CLEAR CLEAR   Specific Gravity, Urine 1.011 1.005 - 1.030   pH 5.0 5.0 - 8.0   Glucose, UA NEGATIVE NEGATIVE mg/dL   Hgb urine dipstick NEGATIVE NEGATIVE   Bilirubin Urine NEGATIVE NEGATIVE   Ketones, ur NEGATIVE NEGATIVE mg/dL   Protein, ur NEGATIVE NEGATIVE mg/dL   Nitrite NEGATIVE NEGATIVE   Leukocytes,Ua NEGATIVE NEGATIVE    Comment: Performed at [facility] [address], [place], [place] [address]  Comprehensive metabolic panel     Status: Abnormal   Collection Time: 09/22/18  7:33 PM  Result Value Ref Range   Sodium 137 135 - 145 mmol/L   Potassium 3.8 3.5 - 5.1 mmol/L   Chloride 101 98 - 111 mmol/L   CO2 16 (L) 22 - 32 mmol/L   Glucose, Bld 119 (H) 70 - 99 mg/dL   BUN 91 (H) 8 - 23 mg/dL   Creatinine, Ser 3.77 (H) 0.61 - 1.24 mg/dL   Calcium 9.3 8.9 - 10.3 mg/dL   Total Protein 7.9 6.5 - 8.1 g/dL   Albumin 3.5 3.5 - 5.0 g/dL   AST 20 15 - 41 U/L   ALT 13 0 - 44 U/L   Alkaline Phosphatase 66 38 - 126 U/L   Total Bilirubin 1.1 0.3 - 1.2 mg/dL   GFR calc non Af Amer 14 (L) >60 mL/min   GFR calc Af Amer 16 (L) >60 mL/min   Anion gap 20 (H) 5 - 15    Comment: Performed at [facility]  [address], [place], [place] [address]  Lactic acid, plasma     Status: None   Collection Time: 09/22/18  7:33 PM  Result Value Ref Range   Lactic Acid, Venous 1.7 0.5 - 1.9 mmol/L    Comment: Performed at [facility] [address] [place], [place] [address]  SARS Coronavirus 2 (CEPHEID - Performed in [place] hospital lab), Hosp Order     Status: None   Collection Time: 09/22/18  7:47 PM  Result Value Ref Range   SARS Coronavirus 2 NEGATIVE  NEGATIVE    Comment: (NOTE) If result is NEGATIVE SARS-CoV-2 target nucleic acids are NOT DETECTED. The SARS-CoV-2 RNA is generally detectable in upper and lower  respiratory specimens during the acute phase of infection. The lowest  concentration of SARS-CoV-2 viral copies this assay can detect is 250  copies / mL. A negative result does not preclude SARS-CoV-2 infection  and should not be used as the sole basis for treatment or other  patient management decisions.  A negative result may occur with  improper specimen collection / handling, submission of specimen other  than nasopharyngeal swab, presence of viral mutation(s) within the  areas targeted by this assay, and inadequate number of viral copies  (<250 copies / mL). A negative result must be combined with clinical  observations, patient history, and epidemiological information. If result is POSITIVE SARS-CoV-2 target nucleic acids are DETECTED. The SARS-CoV-2 RNA is generally detectable in upper and lower  respiratory specimens dur ing the acute phase of infection.  Positive  results are indicative of active infection with SARS-CoV-2.  Clinical  correlation with patient history and other diagnostic information is  necessary to determine patient infection status.  Positive results do  not rule out bacterial infection or co-infection with other viruses. If result is PRESUMPTIVE POSTIVE SARS-CoV-2 nucleic acids MAY BE PRESENT.   A presumptive positive result was obtained on the submitted specimen  and confirmed on repeat testing.  While 2019 novel coronavirus  (SARS-CoV-2) nucleic acids may be present in the submitted sample  additional confirmatory testing may be necessary for epidemiological  and / or clinical management purposes  to differentiate between  SARS-CoV-2 and other Sarbecovirus currently known to infect humans.  If clinically indicated additional testing with an alternate test  methodology [phone]) is advised. The  SARS-CoV-2 RNA is generally  detectable in upper and lower respiratory sp ecimens during the acute  phase of infection. The expected result is Negative. Fact Sheet for Patients:  StrictlyIdeas.no Fact Sheet for Healthcare Providers: BankingDealers.co.za This test is not yet approved or cleared by the [place] FDA and has been authorized for detection and/or diagnosis of SARS-CoV-2 by FDA under an Emergency Use Authorization (EUA).  This EUA will remain in effect (meaning this test can be used) for the duration of the COVID-19 declaration under Section 564(b)(1) of the Act, 21 U.S.C. section 360bbb-3(b)(1), unless the authorization is terminated or revoked sooner. Performed at [facility], [place] [address] [place], [place] [address]    Ct Head Wo Contrast  Result Date: 09/22/2018 CLINICAL DATA:  Altered level of consciousness. Weakness. EXAM: CT HEAD WITHOUT CONTRAST TECHNIQUE: Contiguous axial images were obtained from the base of the skull through the vertex without intravenous contrast. COMPARISON:  None. FINDINGS: Brain: There is no evidence of acute infarct, intracranial hemorrhage, mass, midline shift, or extra-axial fluid collection. There is mild cerebral atrophy. Cerebral white matter hypodensities are nonspecific but compatible with mild chronic small vessel  ischemic disease. A left choroidal fissure cyst is noted. Vascular: Calcified atherosclerosis at the skull base. No suspicious vascular hyperdensity. Skull: No fracture or focal osseous lesion. Sinuses/Orbits: Paranasal sinuses and mastoid air cells are clear. Bilateral cataract extraction. Other: None. IMPRESSION: 1. No evidence of acute intracranial abnormality. 2. Mild chronic small vessel ischemic disease. Electronically Signed   By: Logan Bores M.D.   On: 09/22/2018 21:48   Dg Chest Portable 1 View  Result Date: 09/22/2018 CLINICAL DATA:  Altered mental status EXAM:  PORTABLE CHEST 1 VIEW COMPARISON:  08/04/2014 FINDINGS: Cardiac shadow is mildly enlarged but stable. Postsurgical changes and pacing device are again seen and stable. Chronic scarring in the left base is noted. Mild increased density is noted in the right hemithorax consistent with small posterior effusion. Mild right basilar infiltrate is noted as well. No focal confluent infiltrate is seen. No bony abnormality is noted. IMPRESSION: Small right pleural effusion with mild basilar infiltrate Chronic changes in the right base. Electronically Signed   By: Inez Catalina M.D.   On: 09/22/2018 21:42    Pending Labs Unresulted Labs (From admission, onward)    Start     Ordered   09/22/18 1933  Culture, blood (routine x 2)  BLOOD CULTURE X 2,   STAT     09/22/18 1933          Vitals/Pain Today's Vitals   09/23/18 0000 09/23/18 0030 09/23/18 0045 09/23/18 0115  BP: 92/70 92/67 (!) 93/58 97/69  Pulse: 88 70 70 73  Resp: 18 15 16 18   Temp:      TempSrc:      SpO2: 98% 97% 97% 99%  Weight:      Height:      PainSc:        Isolation Precautions No active isolations  Medications Medications  sodium chloride 0.9 % bolus 500 mL (0 mLs Intravenous Stopped 09/22/18 2051)    Mobility walks Moderate fall risk   Focused Assessments Cardiac Assessment Handoff:    Lab Results  Component Value Date   CKTOTAL 85 07/28/2013   CKMB 0.7 05/04/2008   TROPONINI 0.08 (HH) 09/22/2018   No results found for: DDIMER Does the Patient currently have chest pain? No     R Recommendations: See Admitting Provider Note  Report given to:   Additional Notes: n/a

## 2018-09-23 NOTE — Progress Notes (Signed)
I spoke with patient's wife and give her update.

## 2018-09-23 NOTE — Progress Notes (Signed)
PHARMACY - PHYSICIAN COMMUNICATION CRITICAL VALUE ALERT - BLOOD CULTURE IDENTIFICATION (BCID)  Marcus [name] is an 85 y.o. male who presented to [facility] 09/22/2018 with a chief complaint of AMS.  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    Arrie Senate, PharmD, BCPS Clinical Pharmacist [phone] Please check AMION for all [place] numbers 09/23/2018

## 2018-09-23 NOTE — Plan of Care (Signed)

## 2018-09-24 ENCOUNTER — Inpatient Hospital Stay (HOSPITAL_COMMUNITY): Payer: Medicare Other

## 2018-09-24 DIAGNOSIS — Z951 Presence of aortocoronary bypass graft: Secondary | ICD-10-CM

## 2018-09-24 DIAGNOSIS — Z87891 Personal history of nicotine dependence: Secondary | ICD-10-CM

## 2018-09-24 DIAGNOSIS — A329 Listeriosis, unspecified: Secondary | ICD-10-CM

## 2018-09-24 DIAGNOSIS — R7881 Bacteremia: Secondary | ICD-10-CM

## 2018-09-24 DIAGNOSIS — N183 Chronic kidney disease, stage 3 (moderate): Secondary | ICD-10-CM

## 2018-09-24 LAB — BASIC METABOLIC PANEL
Anion gap: 14 (ref 5–15)
BUN: 85 mg/dL — ABNORMAL HIGH (ref 8–23)
CO2: 23 mmol/L (ref 22–32)
Calcium: 8.7 mg/dL — ABNORMAL LOW (ref 8.9–10.3)
Chloride: 103 mmol/L (ref 98–111)
Creatinine, Ser: 3.32 mg/dL — ABNORMAL HIGH (ref 0.61–1.24)
GFR calc Af Amer: 18 mL/min — ABNORMAL LOW (ref >60)
GFR calc non Af Amer: 16 mL/min — ABNORMAL LOW (ref >60)
Glucose, Bld: 122 mg/dL — ABNORMAL HIGH (ref 70–99)
Potassium: 3.7 mmol/L (ref 3.5–5.1)
Sodium: 140 mmol/L (ref 135–145)

## 2018-09-24 LAB — CBC WITH DIFFERENTIAL/PLATELET
Abs Immature Granulocytes: 0.04 10*3/uL (ref 0.00–0.07)
Basophils Absolute: 0 10*3/uL (ref 0.0–0.1)
Basophils Relative: 0 %
Eosinophils Absolute: 0 10*3/uL (ref 0.0–0.5)
Eosinophils Relative: 0 %
HCT: 31.3 % — ABNORMAL LOW (ref 39.0–52.0)
Hemoglobin: 9.9 g/dL — ABNORMAL LOW (ref 13.0–17.0)
Immature Granulocytes: 1 %
Lymphocytes Relative: 6 %
Lymphs Abs: 0.4 10*3/uL — ABNORMAL LOW (ref 0.7–4.0)
MCH: 29 pg (ref 26.0–34.0)
MCHC: 31.6 g/dL (ref 30.0–36.0)
MCV: 91.8 fL (ref 80.0–100.0)
Monocytes Absolute: 0.8 10*3/uL (ref 0.1–1.0)
Monocytes Relative: 11 %
Neutro Abs: 5.7 10*3/uL (ref 1.7–7.7)
Neutrophils Relative %: 82 %
Platelets: 181 10*3/uL (ref 150–400)
RBC: 3.41 MIL/uL — ABNORMAL LOW (ref 4.22–5.81)
RDW: 14.8 % (ref 11.5–15.5)
WBC: 7 10*3/uL (ref 4.0–10.5)
nRBC: 0 % (ref 0.0–0.2)

## 2018-09-24 LAB — TROPONIN I: Troponin I: 0.15 ng/mL (ref <0.03)

## 2018-09-24 MED ORDER — SULFAMETHOXAZOLE-TRIMETHOPRIM 400-80 MG/5ML IV SOLN
390.0000 mg | Freq: Two times a day (BID) | INTRAVENOUS | Status: DC
  Filled 2018-09-24 (×2): qty 24.38

## 2018-09-24 MED ORDER — TORSEMIDE 20 MG PO TABS
40.0000 mg | ORAL_TABLET | Freq: Every day | ORAL | Status: DC
  Administered 2018-09-24 – 2018-09-26 (×3): 40 mg via ORAL
  Filled 2018-09-24 (×3): qty 2

## 2018-09-24 MED ORDER — SULFAMETHOXAZOLE-TRIMETHOPRIM 400-80 MG/5ML IV SOLN
390.0000 mg | Freq: Two times a day (BID) | INTRAVENOUS | Status: DC
  Administered 2018-09-24 (×2): 390 mg via INTRAVENOUS
  Filled 2018-09-24 (×4): qty 24.38

## 2018-09-24 MED ORDER — CARVEDILOL 3.125 MG PO TABS
3.1250 mg | ORAL_TABLET | Freq: Two times a day (BID) | ORAL | Status: DC
  Filled 2018-09-24: qty 1

## 2018-09-24 NOTE — Progress Notes (Signed)
PROGRESS NOTE  Marcus [name] MVH:[id number] DOB: Jun 07, 1928 DOA: 09/22/2018 PCP: Patient, No Pcp Per   LOS: 1 day   Brief narrative: Marcus [name] is a 85 y.o. male with medical history significant of paroxysmal atrial fibrillation, sick sinus syndrome status post PPM, ischemic cardiomyopathy with EF 35 to 40%, CKD stage III, CAD status post CABG and PCIs, DVT, hypertension, hyperlipidemia, peripheral vascular disease, presented to the hospital for evaluation of altered mental status and generalized weakness.  Home health nurse reported temperature of 99.4 F to cardiology office who then advised patient to come into the hospital for further evaluation. Patient's wife has been concerned that he has not been acting right for the past 2 weeks with generalized weakness.   Subjective: Today the, patient feels okay.  Denies any shortness of breath or chest pain, palpitation or fever.  Denies nausea, vomiting or abdominal pain.  Assessment/Plan:  Principal Problem:   CAP (community acquired pneumonia) Active Problems:   Chronic systolic CHF (congestive heart failure) (HCC)   Elevated troponin   AKI (acute kidney injury) ([place])   CKD (chronic kidney disease) stage 4, GFR 15-29 ml/min (HCC)  Possible right lower lobe pneumonia.  Improved.  On Rocephin and Zithromax.  Follow cultures.  Covid test negative.  Continue to monitor closely.  Listeria monocytogenes bacteremia.  Patient is currently on ampicillin.  Pharmacy recommend Bactrim.  Spoke with infectious disease, for consultation.  Acute on chronic systolic heart failure.  History of ischemic cardiomyopathy with ejection fraction of 35 to 40% from 2D echocardiogram on 06/08/2018.  Has a pacer in place as well.  Cardiology on board.  Torsemide on hold.   AKI on CKD stage 3. His base cr is 3,3, on admission at 3.7.  Continue today's 3.3.  History of atrial fibrillation, paroxysmal.  Continue amiodarone and apixaban.  Currently  paced.  Elevated troponin, possible due to acute on chronic CHF.  History of coronary artery disease CABG.  Likely demand ischemia.  No chest pain.  No further work-up.  Continue Eliquis.  Generalized weakness. Physical therapy has seen the patient and recommended home PT on discharge.  VTE Prophylaxis: Apixaban  Code Status: DNR  Family Communication:  Spoke with the patient's wife on the phone and updated her about the clinical condition of the patient.  Disposition Plan: Home likely in 2 to 3 days.   Consultants:  Infectious disease, cardiology  Procedures:  None  Antibiotics: Anti-infectives (From admission, onward)   Start     Dose/Rate Route Frequency Ordered Stop   09/24/18 1000  azithromycin (ZITHROMAX) tablet 500 mg  Status:  Discontinued     500 mg Oral Daily 09/23/18 0854 09/23/18 1755   09/23/18 1900  ampicillin (OMNIPEN) 2 g in sodium chloride 0.9 % 100 mL IVPB     2 g 300 mL/hr over 20 Minutes Intravenous Every 8 hours 09/23/18 1755     09/23/18 0300  azithromycin (ZITHROMAX) 500 mg in sodium chloride 0.9 % 250 mL IVPB  Status:  Discontinued     500 mg 250 mL/hr over 60 Minutes Intravenous Every 24 hours 09/23/18 0137 09/23/18 0854   09/23/18 0200  cefTRIAXone (ROCEPHIN) 1 g in sodium chloride 0.9 % 100 mL IVPB  Status:  Discontinued     1 g 200 mL/hr over 30 Minutes Intravenous Every 24 hours 09/23/18 0137 09/23/18 1755      Objective: Vitals:   09/24/18 0700 09/24/18 1059  BP: 97/74 (!) 82/57  Pulse: 74 75  Resp:    Temp: 98.9 F (37.2 C) 98.3 F (36.8 C)  SpO2:  92%    Intake/Output Summary (Last 24 hours) at 09/24/2018 1112 Last data filed at 09/24/2018 0915 Gross per 24 hour  Intake 990 ml  Output 800 ml  Net 190 ml   Filed Weights   09/22/18 1855 09/23/18 0150 09/24/18 0300  Weight: 74.8 kg 77.5 kg 78.6 kg   Body mass index is 25.59 kg/m.   Physical Exam: GENERAL: Patient is alert awake and communicative. Not in obvious  distress. HENT: No scleral pallor or icterus. Pupils equally reactive to light. Oral mucosa is moist NECK: is supple, no palpable thyroid enlargement. CHEST: Clear to auscultation. No crackles or wheezes. Non tender on palpation. Diminished breath sounds bilaterally. CVS: S1 and S2 heard, no murmur. Regular rate and rhythm. No pericardial rub. ABDOMEN: Soft, non-tender, bowel sounds are present. No palpable hepato-splenomegaly. EXTREMITIES: Bilateral lower extremity edema CNS: Cranial nerves are intact. No focal motor or sensory deficits. SKIN: warm and dry without rashes.  Data Review: I have personally reviewed the following laboratory data and studies,  CBC: Recent Labs  Lab 09/22/18 1857 09/24/18 0229  WBC 9.7 7.0  NEUTROABS  --  5.7  HGB 10.7* 9.9*  HCT 34.7* 31.3*  MCV 96.7 91.8  PLT 162 329   Basic Metabolic Panel: Recent Labs  Lab 09/22/18 1933 09/23/18 0209 09/24/18 0229  NA 137 138 140  K 3.8 4.1 3.7  CL 101 102 103  CO2 16* 17* 23  GLUCOSE 119* 119* 122*  BUN 91* 91* 85*  CREATININE 3.77* 3.66* 3.32*  CALCIUM 9.3 9.0 8.7*  PHOS  --  4.0  --    Liver Function Tests: Recent Labs  Lab 09/22/18 1933 09/23/18 0209  AST 20  --   ALT 13  --   ALKPHOS 66  --   BILITOT 1.1  --   PROT 7.9  --   ALBUMIN 3.5 3.2*   No results for input(s): LIPASE, AMYLASE in the last 168 hours. No results for input(s): AMMONIA in the last 168 hours. Cardiac Enzymes: Recent Labs  Lab 09/23/18 0209 09/23/18 0711 09/23/18 1708 09/23/18 1930 09/24/18 0229  TROPONINI 0.10* 0.12* 0.16* 0.16* 0.15*   BNP (last 3 results) Recent Labs    06/25/18 1209 09/22/18 1857  BNP 800.3* 1,259.5*    ProBNP (last 3 results) No results for input(s): PROBNP in the last 8760 hours.  CBG: No results for input(s): GLUCAP in the last 168 hours. Recent Results (from the past 240 hour(s))  Culture, blood (routine x 2)     Status: Abnormal (Preliminary result)   Collection Time:  09/22/18  7:28 PM  Result Value Ref Range Status   Specimen Description BLOOD RIGHT ANTECUBITAL  Final   Special Requests   Final    BOTTLES DRAWN AEROBIC AND ANAEROBIC Blood Culture adequate volume   Culture  Setup Time (A)  Final    GRAM VARIABLE ROD Organism ID to follow CRITICAL RESULT CALLED TO, READ BACK BY AND VERIFIED WITH: Hughie Closs [facility] 09/23/18 1741 JDW Performed at [facility], [address], [place],  [address]    Culture GRAM VARIABLE ROD (A)  Final   Report Status PENDING  Incomplete  Blood Culture ID Panel (Reflexed)     Status: Abnormal   Collection Time: 09/22/18  7:28 PM  Result Value Ref Range Status   Enterococcus species NOT DETECTED NOT DETECTED Final   Vancomycin resistance  NOT DETECTED NOT DETECTED Final   Listeria monocytogenes DETECTED (A) NOT DETECTED Final    Comment: CRITICAL RESULT CALLED TO, READ BACK BY AND VERIFIED WITH: Hughie Closs PHARMD 09/23/18 1741 JDW    Staphylococcus species NOT DETECTED NOT DETECTED Final   Staphylococcus aureus (BCID) NOT DETECTED NOT DETECTED Final   Methicillin resistance NOT DETECTED NOT DETECTED Final   Streptococcus species NOT DETECTED NOT DETECTED Final   Streptococcus agalactiae NOT DETECTED NOT DETECTED Final   Streptococcus pneumoniae NOT DETECTED NOT DETECTED Final   Streptococcus pyogenes NOT DETECTED NOT DETECTED Final   Acinetobacter baumannii NOT DETECTED NOT DETECTED Final   Enterobacteriaceae species NOT DETECTED NOT DETECTED Final   Enterobacter cloacae complex NOT DETECTED NOT DETECTED Final   Escherichia coli NOT DETECTED NOT DETECTED Final   Klebsiella oxytoca NOT DETECTED NOT DETECTED Final   Klebsiella pneumoniae NOT DETECTED NOT DETECTED Final   Proteus species NOT DETECTED NOT DETECTED Final   Serratia marcescens NOT DETECTED NOT DETECTED Final   Carbapenem resistance NOT DETECTED NOT DETECTED Final   Haemophilus influenzae NOT DETECTED NOT DETECTED Final   Neisseria meningitidis NOT  DETECTED NOT DETECTED Final   Pseudomonas aeruginosa NOT DETECTED NOT DETECTED Final   Candida albicans NOT DETECTED NOT DETECTED Final   Candida glabrata NOT DETECTED NOT DETECTED Final   Candida krusei NOT DETECTED NOT DETECTED Final   Candida parapsilosis NOT DETECTED NOT DETECTED Final   Candida tropicalis NOT DETECTED NOT DETECTED Final    Comment: Performed at [facility], [place]. [address] [place], [place] [address]  Culture, blood (routine x 2)     Status: Abnormal (Preliminary result)   Collection Time: 09/22/18  7:47 PM  Result Value Ref Range Status   Specimen Description BLOOD LEFT ANTECUBITAL  Final   Special Requests   Final    BOTTLES DRAWN AEROBIC ONLY Blood Culture adequate volume   Culture  Setup Time   Final    AEROBIC BOTTLE ONLY GRAM VARIABLE ROD CRITICAL VALUE NOTED.  VALUE IS CONSISTENT WITH PREVIOUSLY REPORTED AND CALLED VALUE.    Culture (A)  Final    LISTERIA MONOCYTOGENES Standardized susceptibility testing for this organism is not available. HEALTH DEPARTMENT NOTIFIED Performed at [facility], [place] [address] [place], [place] [address]    Report Status PENDING  Incomplete  SARS Coronavirus 2 (CEPHEID - Performed in [facility] hospital lab), Hosp Order     Status: None   Collection Time: 09/22/18  7:47 PM  Result Value Ref Range Status   SARS Coronavirus 2 NEGATIVE NEGATIVE Final    Comment: (NOTE) If result is NEGATIVE SARS-CoV-2 target nucleic acids are NOT DETECTED. The SARS-CoV-2 RNA is generally detectable in upper and lower  respiratory specimens during the acute phase of infection. The lowest  concentration of SARS-CoV-2 viral copies this assay can detect is 250  copies / mL. A negative result does not preclude SARS-CoV-2 infection  and should not be used as the sole basis for treatment or other  patient management decisions.  A negative result may occur with  improper specimen collection / handling, submission of specimen other   than nasopharyngeal swab, presence of viral mutation(s) within the  areas targeted by this assay, and inadequate number of viral copies  (<250 copies / mL). A negative result must be combined with clinical  observations, patient history, and epidemiological information. If result is POSITIVE SARS-CoV-2 target nucleic acids are DETECTED. The SARS-CoV-2 RNA is generally detectable in  upper and lower  respiratory specimens dur ing the acute phase of infection.  Positive  results are indicative of active infection with SARS-CoV-2.  Clinical  correlation with patient history and other diagnostic information is  necessary to determine patient infection status.  Positive results do  not rule out bacterial infection or co-infection with other viruses. If result is PRESUMPTIVE POSTIVE SARS-CoV-2 nucleic acids MAY BE PRESENT.   A presumptive positive result was obtained on the submitted specimen  and confirmed on repeat testing.  While 2019 novel coronavirus  (SARS-CoV-2) nucleic acids may be present in the submitted sample  additional confirmatory testing may be necessary for epidemiological  and / or clinical management purposes  to differentiate between  SARS-CoV-2 and other Sarbecovirus currently known to infect humans.  If clinically indicated additional testing with an alternate test  methodology [id number]) is advised. The SARS-CoV-2 RNA is generally  detectable in upper and lower respiratory sp ecimens during the acute  phase of infection. The expected result is Negative. Fact Sheet for Patients:  StrictlyIdeas.no Fact Sheet for Healthcare Providers: BankingDealers.co.za This test is not yet approved or cleared by the [place] FDA and has been authorized for detection and/or diagnosis of SARS-CoV-2 by FDA under an Emergency Use Authorization (EUA).  This EUA will remain in effect (meaning this test can be used) for the duration of  the COVID-19 declaration under Section 564(b)(1) of the Act, 21 U.S.C. section 360bbb-3(b)(1), unless the authorization is terminated or revoked sooner. Performed at [facility], [place] [address], [place], [place] [address]   MRSA PCR Screening     Status: None   Collection Time: 09/23/18  2:31 AM  Result Value Ref Range Status   MRSA by PCR NEGATIVE NEGATIVE Final    Comment:        The GeneXpert MRSA Assay (FDA approved for NASAL specimens only), is one component of a comprehensive MRSA colonization surveillance program. It is not intended to diagnose MRSA infection nor to guide or monitor treatment for MRSA infections. Performed at [facility], [place] [address], [name], [place] [address]      Studies: Dg Chest 2 View  Result Date: 09/23/2018 CLINICAL DATA:  Dyspnea.  Altered mental status. EXAM: CHEST - 2 VIEW COMPARISON:  09/22/2018 FINDINGS: Stable appearance of pacer device and associated leads. Mild cardiomegaly. Prior median sternotomy. Atherosclerotic calcification of the aortic arch. Mild interstitial accentuation is present bilaterally. Chronic retrodiaphragmatic opacity on the right primarily along the posterior pleural margin. Pleural calcification is noted projecting over the cardiac apex. 4.6 cm nodule projects over the upper portion of the right major fissure and based on prior exams such as the chest CT from 05/03/2008 may represent chronic pleural fluid density collection, although is not entirely specific. No free-flowing pleural effusion is identified. IMPRESSION: 1. Cardiomegaly with interstitial accentuation favoring interstitial edema. 2. 4.6 cm in diameter masslike density in the vicinity of the upper portion of the right major fissure is noted; on prior exams such as the remote prior chest CT from 05/03/2008, there has been loculated pleural fluid and some in the vicinity of the major fissure. No free-flowing pleural effusion is currently identified.  Strictly speaking I cannot exclude a right upper lobe lung cancer, and chest CT could be utilized to differentiate. 3. Dense pleural calcifications projecting over the cardiac apex. Electronically Signed   By: Van Clines M.D.   On: 09/23/2018 12:26   Ct Head Wo Contrast  Result Date: 09/22/2018  CLINICAL DATA:  Altered level of consciousness. Weakness. EXAM: CT HEAD WITHOUT CONTRAST TECHNIQUE: Contiguous axial images were obtained from the base of the skull through the vertex without intravenous contrast. COMPARISON:  None. FINDINGS: Brain: There is no evidence of acute infarct, intracranial hemorrhage, mass, midline shift, or extra-axial fluid collection. There is mild cerebral atrophy. Cerebral white matter hypodensities are nonspecific but compatible with mild chronic small vessel ischemic disease. A left choroidal fissure cyst is noted. Vascular: Calcified atherosclerosis at the skull base. No suspicious vascular hyperdensity. Skull: No fracture or focal osseous lesion. Sinuses/Orbits: Paranasal sinuses and mastoid air cells are clear. Bilateral cataract extraction. Other: None. IMPRESSION: 1. No evidence of acute intracranial abnormality. 2. Mild chronic small vessel ischemic disease. Electronically Signed   By: Logan Bores M.D.   On: 09/22/2018 21:48   US Renal  Result Date: 09/23/2018 CLINICAL DATA:  Acute kidney injury EXAM: RENAL / URINARY TRACT ULTRASOUND COMPLETE COMPARISON:  07/18/2018 FINDINGS: Right Kidney: Renal measurements: 9.9 x 4.3 x 4.9 cm = volume: 109 mL. Cortical thinning and increased echotexture. No mass or hydronephrosis. Left Kidney: Renal measurements: 9.9 x 3.7 x 4.3 cm = volume: 82 mL. Cortical thinning with increased echotexture. No mass or hydronephrosis. Bladder: Appears normal for degree of bladder distention. IMPRESSION: Stable cortical thinning and increased echotexture compatible with chronic medical renal disease. No hydronephrosis or acute findings. Electronically  Signed   By: Rolm Baptise M.D.   On: 09/23/2018 03:25   Dg Chest Portable 1 View  Result Date: 09/22/2018 CLINICAL DATA:  Altered mental status EXAM: PORTABLE CHEST 1 VIEW COMPARISON:  08/04/2014 FINDINGS: Cardiac shadow is mildly enlarged but stable. Postsurgical changes and pacing device are again seen and stable. Chronic scarring in the left base is noted. Mild increased density is noted in the right hemithorax consistent with small posterior effusion. Mild right basilar infiltrate is noted as well. No focal confluent infiltrate is seen. No bony abnormality is noted. IMPRESSION: Small right pleural effusion with mild basilar infiltrate Chronic changes in the right base. Electronically Signed   By: Inez Catalina M.D.   On: 09/22/2018 21:42    Scheduled Meds:  amiodarone  100 mg Oral Daily   apixaban  2.5 mg Oral BID   carvedilol  3.125 mg Oral BID WC   sodium bicarbonate  650 mg Oral TID   torsemide  40 mg Oral Daily    Continuous Infusions:  ampicillin (OMNIPEN) IV 2 g (09/24/18 0330)    Flora Lipps, MD  [facility] 09/24/2018

## 2018-09-24 NOTE — Progress Notes (Signed)
Patient ID: Marcus [name], male   DOB: 1929/05/17, 85 y.o.   MRN: [id number]     Advanced Heart Failure Rounding Note  PCP-Cardiologist: No primary care provider on file.   Subjective:    Patient much more alert, oriented today. He feels weak but is not short of breath.  SBP in 90s which is not far off his baseline.  BUN/creatinine 85/3.32 which is near his baseline.  Afebrile.   COVID-19 negative. PCT 0.94. Blood cultures have grown Listeria and he has been started on ampicillin.  No abdominal pain or diarrhea, has not had take out food recently.   Objective:   Weight Range: 78.6 kg Body mass index is 25.59 kg/m.   Vital Signs:   Temp:  [98.3 F (36.8 C)-100.8 F (38.2 C)] 98.9 F (37.2 C) (05/06 0700) Pulse Rate:  [68-88] 74 (05/06 0700) Resp:  [19-28] 19 (05/06 0300) BP: (95-101)/(65-74) 97/74 (05/06 0700) SpO2:  [95 %-100 %] 100 % (05/06 0300) Weight:  [78.6 kg] 78.6 kg (05/06 0300)    Weight change: Filed Weights   09/22/18 1855 09/23/18 0150 09/24/18 0300  Weight: 74.8 kg 77.5 kg 78.6 kg    Intake/Output:   Intake/Output Summary (Last 24 hours) at 09/24/2018 1002 Last data filed at 09/24/2018 0915 Gross per 24 hour  Intake 990 ml  Output 800 ml  Net 190 ml      Physical Exam    General: NAD Neck: JVP 9-10 cm, no thyromegaly or thyroid nodule.  Lungs: Decreased BS at bases.  CV: Nondisplaced PMI.  Heart regular S1/S2, no S3/S4, no murmur.  Trace ankle edema.   Abdomen: Soft, nontender, no hepatosplenomegaly, no distention.  Skin: Intact without lesions or rashes.  Neurologic: Alert and oriented x 3.  Psych: Normal affect. Extremities: No clubbing or cyanosis.  HEENT: Normal.    Telemetry   A-V sequential pacing (personally reviewed).   Labs    CBC Recent Labs    09/22/18 1857 09/24/18 0229  WBC 9.7 7.0  NEUTROABS  --  5.7  HGB 10.7* 9.9*  HCT 34.7* 31.3*  MCV 96.7 91.8  PLT 162 092   Basic Metabolic Panel Recent Labs    09/23/18  0209 09/24/18 0229  NA 138 140  K 4.1 3.7  CL 102 103  CO2 17* 23  GLUCOSE 119* 122*  BUN 91* 85*  CREATININE 3.66* 3.32*  CALCIUM 9.0 8.7*  PHOS 4.0  --    Liver Function Tests Recent Labs    09/22/18 1933 09/23/18 0209  AST 20  --   ALT 13  --   ALKPHOS 66  --   BILITOT 1.1  --   PROT 7.9  --   ALBUMIN 3.5 3.2*   No results for input(s): LIPASE, AMYLASE in the last 72 hours. Cardiac Enzymes Recent Labs    09/23/18 1708 09/23/18 1930 09/24/18 0229  TROPONINI 0.16* 0.16* 0.15*    BNP: BNP (last 3 results) Recent Labs    06/25/18 1209 09/22/18 1857  BNP 800.3* 1,259.5*    ProBNP (last 3 results) No results for input(s): PROBNP in the last 8760 hours.   D-Dimer No results for input(s): DDIMER in the last 72 hours. Hemoglobin A1C No results for input(s): HGBA1C in the last 72 hours. Fasting Lipid Panel No results for input(s): CHOL, HDL, LDLCALC, TRIG, CHOLHDL, LDLDIRECT in the last 72 hours. Thyroid Function Tests No results for input(s): TSH, T4TOTAL, T3FREE, THYROIDAB in the last 72 hours.  Invalid input(s): FREET3  Other results:   Imaging    No results found.   Medications:     Scheduled Medications: . amiodarone  100 mg Oral Daily  . apixaban  2.5 mg Oral BID  . carvedilol  3.125 mg Oral BID WC  . sodium bicarbonate  650 mg Oral TID  . torsemide  40 mg Oral Daily    Infusions: . ampicillin (OMNIPEN) IV 2 g (09/24/18 0330)    PRN Medications: acetaminophen **OR** acetaminophen    Assessment/Plan   1. 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  He has been started on ampicillin. He denies abdominal discomfort/diarrhea, has not been eating out.  He was confused/disoriented yesterday but clear today without localizing CNS symptoms/signs.  - Continue ampicillin.  - Would consider ID consult, does he need MRI with contrast of head to look for leptomeningeal involvement?  2. Chronic systolic BEM:[id number] cardiomyopathy. Echo in  1/19 showed EF 35-40% (stable from most recent prior). Has [employer] Jude CRT-P device. On device check on 08/06/18, he was appropriately BiV pacing with extremely rare AF, thoracic impedance was low.  I recently increased his torsemide to 40 mg daily for volume overload.  Per his wife, he has been losing weigh. Mild volume overload (JVD) on exam today.  - Restart torsemide 40 mg daily.  - Restart Coreg at 3.125 mg bid.  - Off spiro with increased creatinine and potassium. - Not on ACEI/ARB/ARNI with CKD. 3. Atrial fibrillation: Paroxysmal. <1% atrial fibrillation on recent device check.  - Continue amiodarone 100 mg daily. - He is on Eliquis 2.5 mg bid (age, elevated creatinine).  4. CAD:H/o CABG.  No chest pain.Mild troponin elevation, no trend.  - Suspect demand ischemia in setting of hypotension/infection and CKD stage IV.  -Refuses statin. - He is not on ASA given use of Eliquis and stable CAD.  5. Sick sinus syndrome: s/p CRT-P.No change. 6. AKI on CKD, stage IV:BUN/creatinine up mildly from baseline initially, now back down (creatinine 3.5 in 2/20).  Follow closely.  7. Abnormal CXR: Mass near right major fissure, ?loculated pleural fluid but recommend CT.  - Will order CT chest w/o contrast to followup.   Length of Stay: 1  [name], MD  09/24/2018, 10:02 AM  Advanced Heart Failure Team Pager [phone] (M-F; 7a - 4p)  Please contact [facility] for night-coverage after hours (4p -7a ) and weekends on [email or web address]

## 2018-09-24 NOTE — Consult Note (Signed)
[facility]       Reason for Consult: Listeria bacteremia    Referring Physician: Dr. Louanne Belton  Principal Problem:   CAP (community acquired pneumonia) Active Problems:   Chronic systolic CHF (congestive heart failure) (HCC)   Elevated troponin   AKI (acute kidney injury) ([place])   CKD (chronic kidney disease) stage 4, GFR 15-29 ml/min ([place])   . amiodarone  100 mg Oral Daily  . apixaban  2.5 mg Oral BID  . carvedilol  3.125 mg Oral BID WC  . sodium bicarbonate  650 mg Oral TID  . torsemide  40 mg Oral Daily    Recommendations: Ampicillin and Bactrim for at least 3 weeks Repeat blood cultures Will need central line access but with CKD so I will defer to primary team for appropriate access (picc vs tunneled catheter).  Assessment: He has bacteremia with Listeria monocytogenes.  Mortality can be high for this though clinically he seems to be improving.  He does not have any GI symptoms, no signs of meningitis.  Will use ampicillin and add Bactrim for more cidal activity.  He has had a head CT that was negative for abscess.  Meningoencephalitis possible with his presentation so will treat him for that possibility for 3 weeks, therefore I do not feel an LP or MRI is absolutely indicated.    Antibiotics: Ampicillin day 2  HPI: Marcus [name] is a 85 y.o. male with CKD and CHF with EF of 35-40%, s/p CABG who came in with AMS, subjective fever, and weakness.  Wife had noted about 2 weeks of symptoms and a home health nurse called cardiology who advised coming to the ED.  He denies any n/v/d.  No abdominal pain.  No new neck pain.  Tmax here of 100.8   Review of Systems:  Constitutional: negative for fevers, chills and anorexia Gastrointestinal: negative for nausea and diarrhea Integument/breast: negative for rash All other systems reviewed and are negative    Past Medical History:  Diagnosis Date  . Atrial flutter ([place])    a. Dx 07/2014, s/p  TEE/DCCV.  . Cataracts, bilateral   . Chronic systolic CHF (congestive heart failure) ([place])    a. s/p CRT-D 09/2013. b. Device explant with temp perm then subsequent CRT-P 07/2014.  Marland Kitchen CKD (chronic kidney disease), stage III ([place])   . Coronary artery disease    a. s/p remote CABG x 2(VG->OM, LIMA->LAD;  b. Late 90's s/p PCI of RCA;  c. 12/09 Cath/PCI: LM 34m, 70-80d (3.0x48mm Xience DES), LAD100p, LCX 80-90p (2.25x49mmTaxus Atom DES), RCA 100d (2.5x88mm Xience DES), VG->OM 100, LIMA->LAD nl, EF 30-35%.  Marland Kitchen DVT (deep venous thrombosis) ([place]) 2017   Right knee  . Hard of hearing   . History of colon polyps   . History of gastric ulcer   . History of gout   . History of shingles   . Hyperlipidemia   . Hypertension   . Ischemic cardiomyopathy    a. EF 35-40% by echo 05/2012, b. EF 20-25%, akinesis and scarring of inferolateral, inferior and inferoseptal myocardium, mild AI, mod MR, LA mod dilated, RV mildly dialted and sys fx mildly reduced, RA mildly dilated (09/2013)  c. EF 35-40% by 2D in 07/2014, 25-30% by TEE.  . Macular degeneration, dry   . Pacemaker infection ([place])   . PAF (paroxysmal atrial fibrillation) ([place])   . Peripheral vascular disease ([place])   . Presence of permanent cardiac pacemaker 07/23/2014   St Jude  Medical Model [id number], Serial [id number]  . Prostate cancer ([place])   . Symptomatic bradycardia    a. 08/2003 s/p MDT Enpulse [id number] Dual chamber PPM ser # [id number] H. b. Device explant due to infection/temp perm insertion/PPM 07/2014.    Social History   Tobacco Use  . Smoking status: Former Smoker    Last attempt to quit: 02/10/1971    Years since quitting: 47.6  . Smokeless tobacco: Never Used  . Tobacco comment: quit smoking 55yrs ago  Substance Use Topics  . Alcohol use: No    Comment: nothing in 28yrs   . Drug use: No    Family History  Problem Relation Age of Onset  . Hypertension Mother   . Colon cancer Mother   . Cancer Mother        Colon  . Alcohol abuse  Father     No Known Allergies  Physical Exam: Constitutional: in no apparent distress  Vitals:   09/24/18 0700 09/24/18 1059  BP: 97/74 (!) 82/57  Pulse: 74 75  Resp:    Temp: 98.9 F (37.2 C) 98.3 F (36.8 C)  SpO2:  92%   EYES: anicteric ENMT: no thrush Cardiovascular: Cor RRR Respiratory: CTA B; normal respiratory effort GI: Bowel sounds are normal, liver is not enlarged, spleen is not enlarged Musculoskeletal: no pedal edema noted Skin: negatives: no rash Neuro: no meningismus, non-focal  Lab Results  Component Value Date   WBC 7.0 09/24/2018   HGB 9.9 (L) 09/24/2018   HCT 31.3 (L) 09/24/2018   MCV 91.8 09/24/2018   PLT 181 09/24/2018    Lab Results  Component Value Date   CREATININE 3.32 (H) 09/24/2018   BUN 85 (H) 09/24/2018   NA 140 09/24/2018   K 3.7 09/24/2018   CL 103 09/24/2018   CO2 23 09/24/2018    Lab Results  Component Value Date   ALT 13 09/22/2018   AST 20 09/22/2018   ALKPHOS 66 09/22/2018     Microbiology: Recent Results (from the past 240 hour(s))  Culture, blood (routine x 2)     Status: Abnormal (Preliminary result)   Collection Time: 09/22/18  7:28 PM  Result Value Ref Range Status   Specimen Description BLOOD RIGHT ANTECUBITAL  Final   Special Requests   Final    BOTTLES DRAWN AEROBIC AND ANAEROBIC Blood Culture adequate volume   Culture  Setup Time (A)  Final    GRAM VARIABLE ROD Organism ID to follow CRITICAL RESULT CALLED TO, READ BACK BY AND VERIFIED WITH: Hughie Closs [facility] 09/23/18 1741 JDW    Culture (A)  Final    LISTERIA MONOCYTOGENES Standardized susceptibility testing for this organism is not available. HEALTH DEPARTMENT NOTIFIED Performed at [facility], [place] [address], [place], [place] [address]    Report Status PENDING  Incomplete  Blood Culture ID Panel (Reflexed)     Status: Abnormal   Collection Time: 09/22/18  7:28 PM  Result Value Ref Range Status   Enterococcus species NOT DETECTED NOT DETECTED  Final   Vancomycin resistance NOT DETECTED NOT DETECTED Final   Listeria monocytogenes DETECTED (A) NOT DETECTED Final    Comment: CRITICAL RESULT CALLED TO, READ BACK BY AND VERIFIED WITH: Hughie Closs [facility] 09/23/18 1741 JDW    Staphylococcus species NOT DETECTED NOT DETECTED Final   Staphylococcus aureus (BCID) NOT DETECTED NOT DETECTED Final   Methicillin resistance NOT DETECTED NOT DETECTED Final   Streptococcus species NOT DETECTED NOT DETECTED Final   Streptococcus agalactiae  NOT DETECTED NOT DETECTED Final   Streptococcus pneumoniae NOT DETECTED NOT DETECTED Final   Streptococcus pyogenes NOT DETECTED NOT DETECTED Final   Acinetobacter baumannii NOT DETECTED NOT DETECTED Final   Enterobacteriaceae species NOT DETECTED NOT DETECTED Final   Enterobacter cloacae complex NOT DETECTED NOT DETECTED Final   Escherichia coli NOT DETECTED NOT DETECTED Final   Klebsiella oxytoca NOT DETECTED NOT DETECTED Final   Klebsiella pneumoniae NOT DETECTED NOT DETECTED Final   Proteus species NOT DETECTED NOT DETECTED Final   Serratia marcescens NOT DETECTED NOT DETECTED Final   Carbapenem resistance NOT DETECTED NOT DETECTED Final   Haemophilus influenzae NOT DETECTED NOT DETECTED Final   Neisseria meningitidis NOT DETECTED NOT DETECTED Final   Pseudomonas aeruginosa NOT DETECTED NOT DETECTED Final   Candida albicans NOT DETECTED NOT DETECTED Final   Candida glabrata NOT DETECTED NOT DETECTED Final   Candida krusei NOT DETECTED NOT DETECTED Final   Candida parapsilosis NOT DETECTED NOT DETECTED Final   Candida tropicalis NOT DETECTED NOT DETECTED Final    Comment: Performed at [name], [place] [address] [place], [place] [address]  Culture, blood (routine x 2)     Status: Abnormal (Preliminary result)   Collection Time: 09/22/18  7:47 PM  Result Value Ref Range Status   Specimen Description BLOOD LEFT ANTECUBITAL  Final   Special Requests   Final    BOTTLES DRAWN AEROBIC ONLY Blood  Culture adequate volume   Culture  Setup Time   Final    AEROBIC BOTTLE ONLY GRAM VARIABLE ROD CRITICAL VALUE NOTED.  VALUE IS CONSISTENT WITH PREVIOUSLY REPORTED AND CALLED VALUE.    Culture (A)  Final    LISTERIA MONOCYTOGENES Standardized susceptibility testing for this organism is not available. HEALTH DEPARTMENT NOTIFIED Performed at [facility], [place] [address], [place], [place] [address]    Report Status PENDING  Incomplete  SARS Coronavirus 2 (CEPHEID - Performed in [place] hospital lab), Hosp Order     Status: None   Collection Time: 09/22/18  7:47 PM  Result Value Ref Range Status   SARS Coronavirus 2 NEGATIVE NEGATIVE Final    Comment: (NOTE) If result is NEGATIVE SARS-CoV-2 target nucleic acids are NOT DETECTED. The SARS-CoV-2 RNA is generally detectable in upper and lower  respiratory specimens during the acute phase of infection. The lowest  concentration of SARS-CoV-2 viral copies this assay can detect is 250  copies / mL. A negative result does not preclude SARS-CoV-2 infection  and should not be used as the sole basis for treatment or other  patient management decisions.  A negative result may occur with  improper specimen collection / handling, submission of specimen other  than nasopharyngeal swab, presence of viral mutation(s) within the  areas targeted by this assay, and inadequate number of viral copies  (<250 copies / mL). A negative result must be combined with clinical  observations, patient history, and epidemiological information. If result is POSITIVE SARS-CoV-2 target nucleic acids are DETECTED. The SARS-CoV-2 RNA is generally detectable in upper and lower  respiratory specimens dur ing the acute phase of infection.  Positive  results are indicative of active infection with SARS-CoV-2.  Clinical  correlation with patient history and other diagnostic information is  necessary to determine patient infection status.  Positive results do  not  rule out bacterial infection or co-infection with other viruses. If result is PRESUMPTIVE POSTIVE SARS-CoV-2 nucleic acids MAY BE PRESENT.   A presumptive positive result was  obtained on the submitted specimen  and confirmed on repeat testing.  While 2019 novel coronavirus  (SARS-CoV-2) nucleic acids may be present in the submitted sample  additional confirmatory testing may be necessary for epidemiological  and / or clinical management purposes  to differentiate between  SARS-CoV-2 and other Sarbecovirus currently known to infect humans.  If clinically indicated additional testing with an alternate test  methodology [phone]) is advised. The SARS-CoV-2 RNA is generally  detectable in upper and lower respiratory sp ecimens during the acute  phase of infection. The expected result is Negative. Fact Sheet for Patients:  StrictlyIdeas.no Fact Sheet for Healthcare Providers: BankingDealers.co.za This test is not yet approved or cleared by the [place] FDA and has been authorized for detection and/or diagnosis of SARS-CoV-2 by FDA under an Emergency Use Authorization (EUA).  This EUA will remain in effect (meaning this test can be used) for the duration of the COVID-19 declaration under Section 564(b)(1) of the Act, 21 U.S.C. section 360bbb-3(b)(1), unless the authorization is terminated or revoked sooner. Performed at [facility], [place] [address] [place], [place] [address]   MRSA PCR Screening     Status: None   Collection Time: 09/23/18  2:31 AM  Result Value Ref Range Status   MRSA by PCR NEGATIVE NEGATIVE Final    Comment:        The GeneXpert MRSA Assay (FDA approved for NASAL specimens only), is one component of a comprehensive MRSA colonization surveillance program. It is not intended to diagnose MRSA infection nor to guide or monitor treatment for MRSA infections. Performed at [facility], [place] [address], [place], [place] [address]     Robert W Comer, [facility] for Infectious Disease [facility] [email or web address] 09/24/2018, 1:09 PM

## 2018-09-24 NOTE — Discharge Instructions (Signed)

## 2018-09-24 NOTE — Evaluation (Signed)
Occupational Therapy Evaluation Patient Details Name: Marcus [name] MRN: [id number] DOB: May 12, 1929 Today's Date: 09/24/2018    History of Present Illness  Marcus [name] is a 85 y.o. male with medical history significant of paroxysmal atrial fibrillation, sick sinus syndrome status post PPM, ischemic cardiomyopathy with EF 35 to 40%, CKD stage III, CAD status post CABG and PCIs, DVT, hypertension, hyperlipidemia, peripheral vascular disease, and conditions listed below presenting to the hospital for evaluation of altered mental status and generalized weakness.   Clinical Impression   Pt was independent prior to admission. Stayed alone while his wife worked. Pt presents with impaired cognition, generalized weakness and mild unsteadiness when transferring. He requires up to mod assist for ADL and min guard, cognition interfering. Will follow acutely.     Follow Up Recommendations  Home health OT;Supervision/Assistance - 24 hour    Equipment Recommendations  3 in 1 bedside commode    Recommendations for Other Services       Precautions / Restrictions Precautions Precautions: Fall Restrictions Weight Bearing Restrictions: No      Mobility Bed Mobility Overal bed mobility: Needs Assistance Bed Mobility: Supine to Sit     Supine to sit: Mod assist     General bed mobility comments: pt needing encouragement for OOB  Transfers Overall transfer level: Needs assistance   Transfers: Sit to/from Stand;Stand Pivot Transfers Sit to Stand: Min guard Stand pivot transfers: Min guard       General transfer comment: bed>chair    Balance Overall balance assessment: Needs assistance   Sitting balance-Leahy Scale: Fair       Standing balance-Leahy Scale: Fair                             ADL either performed or assessed with clinical judgement   ADL Overall ADL's : Needs assistance/impaired Eating/Feeding: Minimal assistance;Sitting Eating/Feeding Details  (indicate cue type and reason): assisted to cut food, pt opening his own milk carton Grooming: Wash/dry face;Sitting;Set up;Supervision/safety   Upper Body Bathing: Minimal assistance;Sitting   Lower Body Bathing: Moderate assistance;Sit to/from stand   Upper Body Dressing : Minimal assistance;Sitting   Lower Body Dressing: Moderate assistance;Sit to/from stand   Toilet Transfer: Stand-pivot;Min guard                   Vision Patient Visual Report: No change from baseline       Perception     Praxis      Pertinent Vitals/Pain Pain Assessment: No/denies pain     Hand Dominance Right   Extremity/Trunk Assessment Upper Extremity Assessment Upper Extremity Assessment: Overall WFL for tasks assessed   Lower Extremity Assessment Lower Extremity Assessment: Defer to PT evaluation   Cervical / Trunk Assessment Cervical / Trunk Assessment: Kyphotic   Communication Communication Communication: HOH   Cognition Arousal/Alertness: Awake/alert Behavior During Therapy: Flat affect Overall Cognitive Status: Impaired/Different from baseline Area of Impairment: Orientation;Memory;Following commands;Problem solving;Safety/judgement                 Orientation Level: Disoriented to;Time;Situation   Memory: Decreased short-term memory Following Commands: Follows one step commands with increased time;Follows one step commands inconsistently Safety/Judgement: Decreased awareness of safety;Decreased awareness of deficits   Problem Solving: Slow processing;Decreased initiation;Requires tactile cues     General Comments       Exercises     Shoulder Instructions      Home Living Family/patient expects to be discharged to:: Private residence  Living Arrangements: Spouse/significant other Available Help at Discharge: Family;Available 24 hours/day Type of Home: House Home Access: Stairs to enter [employer] of Steps: 3 Entrance Stairs-Rails: None Home  Layout: One level     Bathroom Shower/Tub: Occupational psychologist: Handicapped height     Home Equipment: Shower seat          Prior Functioning/Environment Level of Independence: Independent        Comments: drives minimally, indep with bathing, dressing and amb        OT Problem List: Impaired balance (sitting and/or standing);Decreased cognition;Decreased safety awareness      OT Treatment/Interventions: Self-care/ADL training;Patient/family education;Balance training;Therapeutic activities    OT Goals(Current goals can be found in the care plan section) Acute Rehab OT Goals Patient Stated Goal: did not state OT Goal Formulation: Patient unable to participate in goal setting Time For Goal Achievement: 10/08/18 Potential to Achieve Goals: Good ADL Goals Pt Will Perform Grooming: with supervision;standing Pt Will Perform Upper Body Dressing: with set-up;with supervision;sitting Pt Will Perform Lower Body Dressing: with supervision;sit to/from stand Pt Will Transfer to Toilet: with supervision;ambulating Pt Will Perform Toileting - Clothing Manipulation and hygiene: with supervision;sit to/from stand  OT Frequency: Min 2X/week   Barriers to D/C:            Co-evaluation              AM-PAC OT "6 Clicks" Daily Activity     Outcome Measure Help from another person eating meals?: A Little Help from another person taking care of personal grooming?: A Little Help from another person toileting, which includes using toliet, bedpan, or urinal?: A Lot Help from another person bathing (including washing, rinsing, drying)?: A Lot Help from another person to put on and taking off regular upper body clothing?: A Little Help from another person to put on and taking off regular lower body clothing?: A Lot 6 Click Score: 15   End of Session Equipment Utilized During Treatment: Gait belt Nurse Communication: Other (comment)  Activity Tolerance: Patient  tolerated treatment well Patient left: in chair;with call bell/phone within reach;with chair alarm set  OT Visit Diagnosis: Unsteadiness on feet (R26.81);Other symptoms and signs involving cognitive function                Time: 0836-0900 OT Time Calculation (min): 24 min Charges:  OT General Charges $OT Visit: 1 Visit OT Evaluation $OT Eval Moderate Complexity: 1 Mod OT Treatments $Self Care/Home Management : 8-22 mins  Nestor Lewandowsky, OTR/L Acute Rehabilitation Services Pager: [phone] Office: [phone]  Malka So 09/24/2018, 9:13 AM

## 2018-09-24 NOTE — Progress Notes (Signed)
Physical Therapy Treatment Patient Details Name: Marcus [name] MRN: [id number] DOB: [birth year] Today's Date: 09/24/2018    History of Present Illness  Marcus [name] is a 85 y.o. male with medical history significant of paroxysmal atrial fibrillation, sick sinus syndrome status post PPM, ischemic cardiomyopathy with EF 35 to 40%, CKD stage III, CAD status post CABG and PCIs, DVT, hypertension, hyperlipidemia, peripheral vascular disease, and conditions listed below presenting to the hospital for evaluation of altered mental status and generalized weakness.    PT Comments    Pt needed little encouragement to ambulate this afternoon.  Emphasis on transition to EOB, sit to stand and progressing ambulation with the least restrictive AD.  Pt able to manage without AD, but preferred to hold the IV pole when available.    Follow Up Recommendations  Home health PT;Supervision/Assistance - 24 hour     Equipment Recommendations  None recommended by PT    Recommendations for Other Services       Precautions / Restrictions Precautions Precautions: Fall    Mobility  Bed Mobility Overal bed mobility: Needs Assistance Bed Mobility: Supine to Sit     Supine to sit: HOB elevated;Min guard        Transfers Overall transfer level: Needs assistance Equipment used: None Transfers: Sit to/from Stand Sit to Stand: Min guard         General transfer comment: no UE assist needed  Ambulation/Gait Ambulation/Gait assistance: Min guard Gait Distance (Feet): 350 Feet Assistive device: IV Pole;None Gait Pattern/deviations: Step-through pattern Gait velocity: slower to moderate Gait velocity interpretation: 1.31 - 2.62 ft/sec, indicative of limited community ambulator General Gait Details: generally steady.  More steady with IV pole than no AD, pt occasionally wanting to reach for the rail.     Stairs             Wheelchair Mobility    Modified Rankin (Stroke Patients  Only)       Balance Overall balance assessment: Needs assistance   Sitting balance-Leahy Scale: Fair       Standing balance-Leahy Scale: Fair Standing balance comment: pt prefers single uE support                            Cognition Arousal/Alertness: Awake/alert Behavior During Therapy: Flat affect Overall Cognitive Status: Impaired/Different from baseline Area of Impairment: Orientation;Memory;Following commands;Problem solving;Safety/judgement                 Orientation Level: Disoriented to;Time;Situation Current Attention Level: Focused Memory: Decreased short-term memory Following Commands: Follows one step commands with increased time;Follows one step commands inconsistently Safety/Judgement: Decreased awareness of safety;Decreased awareness of deficits Awareness: Emergent Problem Solving: Slow processing;Decreased initiation;Requires tactile cues        Exercises      General Comments General comments (skin integrity, edema, etc.): EHR generally in the 90's and 100's with spike to 129 bpm.  Sats did not read well during gait , but before and after sats registered 99%      Pertinent Vitals/Pain Pain Assessment: No/denies pain    Home Living                      Prior Function            PT Goals (current goals can now be found in the care plan section) Acute Rehab PT Goals Patient Stated Goal: did not state PT Goal Formulation: With family Time For  Goal Achievement: 10/07/18 Potential to Achieve Goals: Good Progress towards PT goals: Progressing toward goals    Frequency    Min 3X/week      PT Plan Current plan remains appropriate    Co-evaluation              AM-PAC PT "6 Clicks" Mobility   Outcome Measure  Help needed turning from your back to your side while in a flat bed without using bedrails?: A Little Help needed moving from lying on your back to sitting on the side of a flat bed without using  bedrails?: A Little Help needed moving to and from a bed to a chair (including a wheelchair)?: A Little Help needed standing up from a chair using your arms (e.g., wheelchair or bedside chair)?: A Little Help needed to walk in hospital room?: A Little Help needed climbing 3-5 steps with a railing? : A Lot 6 Click Score: 17    End of Session   Activity Tolerance: Patient tolerated treatment well Patient left: in chair;with call bell/phone within reach;with chair alarm set   PT Visit Diagnosis: Unsteadiness on feet (R26.81);Muscle weakness (generalized) (M62.81);Difficulty in walking, not elsewhere classified (R26.2)     Time: 7972-8206 PT Time Calculation (min) (ACUTE ONLY): 13 min  Charges:  $Gait Training: 8-22 mins                     09/24/2018  Donnella Sham, PT Acute Rehabilitation Services [phone]  (pager) [phone]  (office)   Marcus [name] 09/24/2018, 2:17 PM

## 2018-09-25 ENCOUNTER — Inpatient Hospital Stay (HOSPITAL_COMMUNITY): Payer: Medicare Other

## 2018-09-25 ENCOUNTER — Inpatient Hospital Stay: Payer: Self-pay

## 2018-09-25 DIAGNOSIS — R7881 Bacteremia: Secondary | ICD-10-CM

## 2018-09-25 LAB — CBC WITH DIFFERENTIAL/PLATELET
Abs Immature Granulocytes: 0.03 10*3/uL (ref 0.00–0.07)
Basophils Absolute: 0 10*3/uL (ref 0.0–0.1)
Basophils Relative: 0 %
Eosinophils Absolute: 0.2 10*3/uL (ref 0.0–0.5)
Eosinophils Relative: 3 %
HCT: 29.4 % — ABNORMAL LOW (ref 39.0–52.0)
Hemoglobin: 9.4 g/dL — ABNORMAL LOW (ref 13.0–17.0)
Immature Granulocytes: 1 %
Lymphocytes Relative: 7 %
Lymphs Abs: 0.4 10*3/uL — ABNORMAL LOW (ref 0.7–4.0)
MCH: 28.9 pg (ref 26.0–34.0)
MCHC: 32 g/dL (ref 30.0–36.0)
MCV: 90.5 fL (ref 80.0–100.0)
Monocytes Absolute: 0.6 10*3/uL (ref 0.1–1.0)
Monocytes Relative: 9 %
Neutro Abs: 4.8 10*3/uL (ref 1.7–7.7)
Neutrophils Relative %: 80 %
Platelets: 172 10*3/uL (ref 150–400)
RBC: 3.25 MIL/uL — ABNORMAL LOW (ref 4.22–5.81)
RDW: 14.9 % (ref 11.5–15.5)
WBC: 6 10*3/uL (ref 4.0–10.5)
nRBC: 0 % (ref 0.0–0.2)

## 2018-09-25 LAB — CULTURE, BLOOD (ROUTINE X 2)
Special Requests: ADEQUATE
Special Requests: ADEQUATE

## 2018-09-25 LAB — BASIC METABOLIC PANEL
Anion gap: 16 — ABNORMAL HIGH (ref 5–15)
BUN: 80 mg/dL — ABNORMAL HIGH (ref 8–23)
CO2: 23 mmol/L (ref 22–32)
Calcium: 8.4 mg/dL — ABNORMAL LOW (ref 8.9–10.3)
Chloride: 97 mmol/L — ABNORMAL LOW (ref 98–111)
Creatinine, Ser: 2.99 mg/dL — ABNORMAL HIGH (ref 0.61–1.24)
GFR calc Af Amer: 21 mL/min — ABNORMAL LOW (ref >60)
GFR calc non Af Amer: 18 mL/min — ABNORMAL LOW (ref >60)
Glucose, Bld: 102 mg/dL — ABNORMAL HIGH (ref 70–99)
Potassium: 3.4 mmol/L — ABNORMAL LOW (ref 3.5–5.1)
Sodium: 136 mmol/L (ref 135–145)

## 2018-09-25 MED ORDER — POTASSIUM CHLORIDE CRYS ER 20 MEQ PO TBCR
40.0000 meq | EXTENDED_RELEASE_TABLET | Freq: Every day | ORAL | Status: DC
  Administered 2018-09-25 – 2018-09-26 (×2): 40 meq via ORAL
  Filled 2018-09-25 (×2): qty 2

## 2018-09-25 MED ORDER — SODIUM CHLORIDE 0.9% FLUSH: 10.0000 mL | INTRAVENOUS | Status: DC | PRN

## 2018-09-25 MED ORDER — SODIUM CHLORIDE 0.9% FLUSH
10.0000 mL | Freq: Two times a day (BID) | INTRAVENOUS | Status: DC
  Administered 2018-09-25: 20 mL
  Administered 2018-09-25 – 2018-09-26 (×2): 10 mL

## 2018-09-25 MED ORDER — SULFAMETHOXAZOLE-TRIMETHOPRIM 800-160 MG PO TABS
2.0000 | ORAL_TABLET | Freq: Two times a day (BID) | ORAL | Status: DC
  Administered 2018-09-25 – 2018-09-26 (×4): 2 via ORAL
  Filled 2018-09-25 (×4): qty 2

## 2018-09-25 MED ORDER — AMPICILLIN IV (FOR PTA / DISCHARGE USE ONLY): 6.0000 g | INTRAVENOUS | 0 refills | Status: DC

## 2018-09-25 MED ORDER — SULFAMETHOXAZOLE-TRIMETHOPRIM 800-160 MG PO TABS: 2.0000 | ORAL_TABLET | Freq: Two times a day (BID) | ORAL | 0 refills | Status: DC

## 2018-09-25 MED ORDER — CARVEDILOL 3.125 MG PO TABS
3.1250 mg | ORAL_TABLET | Freq: Two times a day (BID) | ORAL | Status: DC
  Administered 2018-09-25 (×2): 3.125 mg via ORAL
  Filled 2018-09-25 (×2): qty 1

## 2018-09-25 NOTE — Progress Notes (Signed)
[facility]ious [facility]   Reason for visit: Follow up on Listeria bacteremia  Interval History: repeat cultures sent, no fever, WBC wnl.  No new complaints.  On ampicillin IV and Bactrim for synergy.    Physical Exam: Constitutional:  Vitals:   09/25/18 0321 09/25/18 0720  BP: (!) 95/59 105/62  Pulse: 76   Resp: 17   Temp: 97.6 F (36.4 C) 98.5 F (36.9 C)  SpO2: 96%    patient appears in NAD Eyes: anicteric Respiratory: Normal respiratory effort; CTA B Cardiovascular: RRR GI: soft, nt, nd  Review of Systems: Constitutional: negative for fevers and chills Gastrointestinal: negative for nausea and diarrhea Integument/breast: negative for rash  Lab Results  Component Value Date   WBC 6.0 09/25/2018   HGB 9.4 (L) 09/25/2018   HCT 29.4 (L) 09/25/2018   MCV 90.5 09/25/2018   PLT 172 09/25/2018    Lab Results  Component Value Date   CREATININE 2.99 (H) 09/25/2018   BUN 80 (H) 09/25/2018   NA 136 09/25/2018   K 3.4 (L) 09/25/2018   CL 97 (L) 09/25/2018   CO2 23 09/25/2018    Lab Results  Component Value Date   ALT 13 09/22/2018   AST 20 09/22/2018   ALKPHOS 66 09/22/2018     Microbiology: Recent Results (from the past 240 hour(s))  Culture, blood (routine x 2)     Status: Abnormal   Collection Time: 09/22/18  7:28 PM  Result Value Ref Range Status   Specimen Description BLOOD RIGHT ANTECUBITAL  Final   Special Requests   Final    BOTTLES DRAWN AEROBIC AND ANAEROBIC Blood Culture adequate volume   Culture  Setup Time (A)  Final    GRAM VARIABLE ROD Organism ID to follow CRITICAL RESULT CALLED TO, READ BACK BY AND VERIFIED WITH: Hughie Closs [facility] 09/23/18 1741 JDW    Culture (A)  Final    LISTERIA MONOCYTOGENES Standardized susceptibility testing for this organism is not available. HEALTH DEPARTMENT NOTIFIED Performed at [facility], [place] [address], [place], [place] [address]    Report Status 09/25/2018 FINAL  Final  Blood Culture  ID Panel (Reflexed)     Status: Abnormal   Collection Time: 09/22/18  7:28 PM  Result Value Ref Range Status   Enterococcus species NOT DETECTED NOT DETECTED Final   Vancomycin resistance NOT DETECTED NOT DETECTED Final   Listeria monocytogenes DETECTED (A) NOT DETECTED Final    Comment: CRITICAL RESULT CALLED TO, READ BACK BY AND VERIFIED WITH: Hughie Closs PHARMD 09/23/18 1741 JDW    Staphylococcus species NOT DETECTED NOT DETECTED Final   Staphylococcus aureus (BCID) NOT DETECTED NOT DETECTED Final   Methicillin resistance NOT DETECTED NOT DETECTED Final   Streptococcus species NOT DETECTED NOT DETECTED Final   Streptococcus agalactiae NOT DETECTED NOT DETECTED Final   Streptococcus pneumoniae NOT DETECTED NOT DETECTED Final   Streptococcus pyogenes NOT DETECTED NOT DETECTED Final   Acinetobacter baumannii NOT DETECTED NOT DETECTED Final   Enterobacteriaceae species NOT DETECTED NOT DETECTED Final   Enterobacter cloacae complex NOT DETECTED NOT DETECTED Final   Escherichia coli NOT DETECTED NOT DETECTED Final   Klebsiella oxytoca NOT DETECTED NOT DETECTED Final   Klebsiella pneumoniae NOT DETECTED NOT DETECTED Final   Proteus species NOT DETECTED NOT DETECTED Final   Serratia marcescens NOT DETECTED NOT DETECTED Final   Carbapenem resistance NOT DETECTED NOT DETECTED Final   Haemophilus influenzae NOT DETECTED NOT DETECTED Final  Neisseria meningitidis NOT DETECTED NOT DETECTED Final   Pseudomonas aeruginosa NOT DETECTED NOT DETECTED Final   Candida albicans NOT DETECTED NOT DETECTED Final   Candida glabrata NOT DETECTED NOT DETECTED Final   Candida krusei NOT DETECTED NOT DETECTED Final   Candida parapsilosis NOT DETECTED NOT DETECTED Final   Candida tropicalis NOT DETECTED NOT DETECTED Final    Comment: Performed at [facility], [place] [address] [place], [place] [address]  Culture, blood (routine x 2)     Status: Abnormal   Collection Time: 09/22/18  7:47 PM  Result Value  Ref Range Status   Specimen Description BLOOD LEFT ANTECUBITAL  Final   Special Requests   Final    BOTTLES DRAWN AEROBIC ONLY Blood Culture adequate volume   Culture  Setup Time   Final    AEROBIC BOTTLE ONLY GRAM VARIABLE ROD CRITICAL VALUE NOTED.  VALUE IS CONSISTENT WITH PREVIOUSLY REPORTED AND CALLED VALUE.    Culture (A)  Final    LISTERIA MONOCYTOGENES Standardized susceptibility testing for this organism is not available. HEALTH DEPARTMENT NOTIFIED Performed at [facility], [place] [address], [place], [place] [address]    Report Status 09/25/2018 FINAL  Final  SARS Coronavirus 2 (CEPHEID - Performed in [facility] lab), Hosp Order     Status: None   Collection Time: 09/22/18  7:47 PM  Result Value Ref Range Status   SARS Coronavirus 2 NEGATIVE NEGATIVE Final    Comment: (NOTE) If result is NEGATIVE SARS-CoV-2 target nucleic acids are NOT DETECTED. The SARS-CoV-2 RNA is generally detectable in upper and lower  respiratory specimens during the acute phase of infection. The lowest  concentration of SARS-CoV-2 viral copies this assay can detect is 250  copies / mL. A negative result does not preclude SARS-CoV-2 infection  and should not be used as the sole basis for treatment or other  patient management decisions.  A negative result may occur with  improper specimen collection / handling, submission of specimen other  than nasopharyngeal swab, presence of viral mutation(s) within the  areas targeted by this assay, and inadequate number of viral copies  (<250 copies / mL). A negative result must be combined with clinical  observations, patient history, and epidemiological information. If result is POSITIVE SARS-CoV-2 target nucleic acids are DETECTED. The SARS-CoV-2 RNA is generally detectable in upper and lower  respiratory specimens dur ing the acute phase of infection.  Positive  results are indicative of active infection with SARS-CoV-2.  Clinical   correlation with patient history and other diagnostic information is  necessary to determine patient infection status.  Positive results do  not rule out bacterial infection or co-infection with other viruses. If result is PRESUMPTIVE POSTIVE SARS-CoV-2 nucleic acids MAY BE PRESENT.   A presumptive positive result was obtained on the submitted specimen  and confirmed on repeat testing.  While 2019 novel coronavirus  (SARS-CoV-2) nucleic acids may be present in the submitted sample  additional confirmatory testing may be necessary for epidemiological  and / or clinical management purposes  to differentiate between  SARS-CoV-2 and other Sarbecovirus currently known to infect humans.  If clinically indicated additional testing with an alternate test  methodology [phone]) is advised. The SARS-CoV-2 RNA is generally  detectable in upper and lower respiratory sp ecimens during the acute  phase of infection. The expected result is Negative. Fact Sheet for Patients:  StrictlyIdeas.no Fact Sheet for Healthcare Providers: BankingDealers.co.za This test is not yet approved or  cleared by the [place] and has been authorized for detection and/or diagnosis of SARS-CoV-2 by FDA under an Emergency Use Authorization (EUA).  This EUA will remain in effect (meaning this test can be used) for the duration of the COVID-19 declaration under Section 564(b)(1) of the Act, 21 U.S.C. section 360bbb-3(b)(1), unless the authorization is terminated or revoked sooner. Performed at [facility], [place] [address] [place], [place] [address]   MRSA PCR Screening     Status: None   Collection Time: 09/23/18  2:31 AM  Result Value Ref Range Status   MRSA by PCR NEGATIVE NEGATIVE Final    Comment:        The GeneXpert MRSA Assay (FDA approved for NASAL specimens only), is one component of a comprehensive MRSA colonization surveillance program. It is not  intended to diagnose MRSA infection nor to guide or monitor treatment for MRSA infections. Performed at [facility], [place] [address], [place], [place] [address]     Impression/Plan:  1. 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  Typicall/ideally treated with ampicillin + something else.  Avoiding gentamicin so will use Bactrim.  Will need high dose 2 DS tabs twice a day.  Will change to oral therapy now.  Will treat for 3 weeks total through May 25th   2.  Access - picc vs other.    3.  Medication monitoring - will have the creat monitored twice per week with the high dose of Bactrim and underlying renal insufficiency  I will follow up with him 5/19 at 11:30 am  Will do opat consult.

## 2018-09-25 NOTE — Progress Notes (Signed)
PHARMACY CONSULT NOTE FOR:  OUTPATIENT  PARENTERAL ANTIBIOTIC THERAPY (OPAT)  Indication: Listeria bacteremia Regimen: Ampicillin IV 6 gm daily as a continuous infusion + Bactrim DS PO 2 tablets BID  End date: May 25th, 2020   IV antibiotic discharge orders are pended. To discharging provider:  please sign these orders via discharge navigator,  Select New Orders & click on the button choice - Manage This Unsigned Work.     Thank you for allowing pharmacy to be a part of this patient's care.  Jimmy Footman, PharmD, BCPS, [place] Infectious [facility] Clinical Pharmacist Phone: [phone] 09/25/2018, 10:05 AM

## 2018-09-25 NOTE — TOC Initial Note (Signed)
Transition of Care [facility]) - Initial/Assessment Note    Patient Details  Name: Marcus [name] MRN: [id number] Date of Birth: Oct 15, 1928  Transition of [facility]) CM/SW Contact:    Benard Halsted, LCSW Phone Number: 09/25/2018, 3:32 PM  Clinical Narrative:                 Marcus [name] a 85 y.o.malewith medical history significant ofparoxysmal atrial fibrillation, sick sinus syndrome status postPPM, ischemic cardiomyopathy with EF 35 to 40%, CKD stage III, CAD status post CABG and PCIs, DVT, hypertension, hyperlipidemia, peripheral vascular disease, presented to the hospital for evaluation of altered mental status and generalized weakness.   Patient and spouse request home health services at discharge. Patient's wife stated patient has [place] and would like to resume their services. She states she feels comfortable with helping patient with his IV antibiotics, but would like for someone to teach her how to do it. She will provide transportation for patient at discharge. Patient did not require DME at home prior to hospitalization. TOC team to set up discharge services.     Expected Discharge Plan: [place] Barriers to Discharge: Continued Medical Work up   Patient Goals and CMS Choice Patient states their goals for this hospitalization and ongoing recovery are:: Return home CMS Medicare.gov Compare Post Acute Care list provided to:: Patient(and spouse) Choice offered to / list presented to : Patient, Spouse  Expected Discharge Plan and Services Expected Discharge Plan: [place] In-house Referral: Clinical Social Work Discharge Planning Services: NA Post Acute Care Choice: Home Health, Resumption of Svcs/PTA Provider Living arrangements for the past 2 months: [place]: [place] ([facility])        Prior Living Arrangements/Services Living arrangements  for the past 2 months: Single Family Home Lives with:: Spouse Patient language and need for interpreter reviewed:: Yes Do you feel safe going back to the place where you live?: Yes      Need for Family Participation in Patient Care: Yes (Comment) Care giver support system in place?: Yes (comment) Current home services: Home PT Criminal Activity/Legal Involvement Pertinent to Current Situation/Hospitalization: No - Comment as needed  Activities of Daily Living Home Assistive Devices/Equipment: None ADL Screening (condition at time of admission) Patient's cognitive ability adequate to safely complete daily activities?: Yes Is the patient deaf or have difficulty hearing?: No Does the patient have difficulty seeing, even when wearing glasses/contacts?: No Does the patient have difficulty concentrating, remembering, or making decisions?: No Patient able to express need for assistance with ADLs?: Yes Does the patient have difficulty dressing or bathing?: No Independently performs ADLs?: Yes (appropriate for developmental age) Does the patient have difficulty walking or climbing stairs?: No Weakness of Legs: None Weakness of Arms/Hands: None  Permission Sought/Granted Permission sought to share information with : Family Supports Permission granted to share information with : Yes, Verbal Permission Granted  Share Information with NAME: Marcus [name]  Permission granted to share info w AGENCY: [facility]  Permission granted to share info w Relationship: Spouse  Permission granted to share info w Contact Information: [phone]  Emotional Assessment Appearance:: Appears stated age Attitude/Demeanor/Rapport: Engaged Affect (typically observed): Accepting, Appropriate Orientation: : Oriented to Self, Oriented to Place, Oriented to  Time, Oriented to Situation Alcohol / Substance  Use: Not Applicable Psych Involvement: No (comment)  Admission diagnosis:  Encephalopathy [G93.40] AKI (acute kidney injury)  ([place]) [N17.9] Congestive heart failure, unspecified HF chronicity, unspecified heart failure type [facility]) [I50.9] Patient Active Problem List   Diagnosis Date Noted  . Bacteremia   . Hypotension 09/23/2018  . CAP (community acquired pneumonia) 09/23/2018  . Elevated troponin 09/23/2018  . AKI (acute kidney injury) ([place]) 09/23/2018  . CKD (chronic kidney disease) stage 4, GFR 15-29 ml/min (HCC) 09/23/2018  . Frequent PVCs 02/23/2015  . Chronic systolic CHF (congestive heart failure) ([place])   . Pacemaker infection ([place]) 07/19/2014  . CKD (chronic kidney disease) stage 3, GFR 30-59 ml/min (HCC) 11/25/2013  . Pacemaker pocket hematoma 10/22/2013  . Acute on chronic systolic heart failure ([place]) 10/06/2013  . H/O noncompliance with medical treatment, presenting hazards to health 08/31/2013  . Peripheral vascular disease, unspecified ([place]) 08/19/2013  . Arterial embolism of right leg ([place]) 07/30/2013  . Ischemic leg 07/29/2013  . Chronic systolic heart failure ([place]) 11/20/2012  . Paroxysmal atrial flutter ([place]) 06/29/2012  . Acute on chronic systolic CHF (congestive heart failure) ([place]) 06/28/2012  . Ischemic cardiomyopathy 06/28/2012  . Hyperlipidemia 06/28/2012  . Normocytic anemia 06/28/2012  . Anemia 06/25/2012  . Pacemaker-Medtronic 02/01/2012  . HTN (hypertension) 02/28/2011  . Essential hypertension, benign 06/16/2010  . CAD 06/16/2010  . Atrial fibrillation ([place]) 06/16/2010  . SICK SINUS SYNDROME 06/16/2010   PCP:  Patient, No Pcp Per Pharmacy:   [facility] [address] [place] - [address] [place] [address] Phone: [phone] Fax: [phone]  [facility] [place] to [address] [place] [address] Phone: [phone] Fax: [phone]     Social Determinants of Health (SDOH) Interventions    Readmission Risk  Interventions Readmission Risk Prevention Plan 09/25/2018  Transportation Screening Complete  PCP or Specialist Appt within 3-5 Days Complete  HRI or [place] Complete  Social Work Consult for [place] Planning/Counseling Complete  Palliative Care Screening Not Applicable  Medication Review Press [employer]) Complete  Some recent data might be hidden

## 2018-09-25 NOTE — Progress Notes (Signed)
Occupational Therapy Treatment Patient Details Name: Marcus [name] MRN: [id number] DOB: 1929/03/27 Today's Date: 09/25/2018    History of present illness  NOLIN GRELL is a 85 y.o. male with medical history significant of paroxysmal atrial fibrillation, sick sinus syndrome status post PPM, ischemic cardiomyopathy with EF 35 to 40%, CKD stage III, CAD status post CABG and PCIs, DVT, hypertension, hyperlipidemia, peripheral vascular disease, and conditions listed below presenting to the hospital for evaluation of altered mental status and generalized weakness.   OT comments  Pt immediately willing to get OOB with OT. Performed UB dressing with min assist and ambulated to bathroom and sink with min guard assist, pt preferring to push IV pole. Cognition is improving with pt able to recall parts of what MD instructed and his home telephone number, but not able to place call.   Follow Up Recommendations  Home health OT;Supervision/Assistance - 24 hour    Equipment Recommendations  3 in 1 bedside commode    Recommendations for Other Services      Precautions / Restrictions Precautions Precautions: Fall Restrictions Weight Bearing Restrictions: No       Mobility Bed Mobility Overal bed mobility: Modified Independent             General bed mobility comments: HOB up  Transfers Overall transfer level: Needs assistance Equipment used: None Transfers: Sit to/from Stand Sit to Stand: Min guard         General transfer comment: pushed IV pole once standing    Balance Overall balance assessment: Needs assistance   Sitting balance-Leahy Scale: Good       Standing balance-Leahy Scale: Fair Standing balance comment: pt prefers single UE support                           ADL either performed or assessed with clinical judgement   ADL Overall ADL's : Needs assistance/impaired     Grooming: Brushing hair;Standing;Oral care;Set up;Supervision/safety           Upper Body Dressing : Sitting;Minimal assistance       Toilet Transfer: Min guard;Ambulation   Toileting- Clothing Manipulation and Hygiene: Min guard(standing)       Functional mobility during ADLs: Min guard(pushed IV pole)       Vision       Perception     Praxis      Cognition Arousal/Alertness: Awake/alert Behavior During Therapy: WFL for tasks assessed/performed Overall Cognitive Status: Impaired/Different from baseline Area of Impairment: Memory;Safety/judgement;Problem solving                     Memory: Decreased short-term memory   Safety/Judgement: Decreased awareness of safety;Decreased awareness of deficits   Problem Solving: Slow processing;Difficulty sequencing General Comments: assisted to place phone call to wife, but pt able to recite his home phone number, also recalling parts of what MD told him        Exercises     Shoulder Instructions       General Comments      Pertinent Vitals/ Pain       Pain Assessment: No/denies pain  Home Living                                          Prior Functioning/Environment  Frequency  Min 2X/week        Progress Toward Goals  OT Goals(current goals can now be found in the care plan section)  Progress towards OT goals: Progressing toward goals  Acute Rehab OT Goals Patient Stated Goal: did not state OT Goal Formulation: Patient unable to participate in goal setting Time For Goal Achievement: 10/08/18 Potential to Achieve Goals: Good  Plan Discharge plan remains appropriate    Co-evaluation                 AM-PAC OT "6 Clicks" Daily Activity     Outcome Measure   Help from another person eating meals?: None Help from another person taking care of personal grooming?: A Little Help from another person toileting, which includes using toliet, bedpan, or urinal?: A Little Help from another person bathing (including washing, rinsing,  drying)?: A Little Help from another person to put on and taking off regular upper body clothing?: A Little Help from another person to put on and taking off regular lower body clothing?: A Little 6 Click Score: 19    End of Session Equipment Utilized During Treatment: Gait belt  OT Visit Diagnosis: Unsteadiness on feet (R26.81);Other symptoms and signs involving cognitive function   Activity Tolerance Patient tolerated treatment well   Patient Left in chair;with call bell/phone within reach;with chair alarm set   Nurse Communication          Time: [phone] OT Time Calculation (min): 30 min  Charges: OT General Charges $OT Visit: 1 Visit OT Treatments $Self Care/Home Management : 23-37 mins  Nestor Lewandowsky, OTR/L Acute Rehabilitation Services Pager: [phone] Office: [phone]   Malka So 09/25/2018, 9:48 AM

## 2018-09-25 NOTE — Progress Notes (Signed)
Patient ID: Marcus [name], male   DOB: 20-Dec-1928, 85 y.o.   MRN: [id number]     Advanced Heart Failure Rounding Note  PCP-Cardiologist: No primary care provider on file.   Subjective:    Patient alert/afebrile.  BUN/creatinine 80/2.99, improved.   COVID-19 negative. PCT 0.94. 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  No abdominal pain or diarrhea, has not had take out food recently.   Objective:   Weight Range: 81.2 kg Body mass index is 26.44 kg/m.   Vital Signs:   Temp:  [97.3 F (36.3 C)-98.5 F (36.9 C)] 98.5 F (36.9 C) (05/07 0720) Pulse Rate:  [70-76] 76 (05/07 0321) Resp:  [17-21] 17 (05/07 0321) BP: (82-105)/(57-62) 105/62 (05/07 0720) SpO2:  [92 %-100 %] 96 % (05/07 0321) Weight:  [81.2 kg] 81.2 kg (05/07 0321)    Weight change: Filed Weights   09/23/18 0150 09/24/18 0300 09/25/18 0321  Weight: 77.5 kg 78.6 kg 81.2 kg    Intake/Output:   Intake/Output Summary (Last 24 hours) at 09/25/2018 0839 Last data filed at 09/24/2018 2209 Gross per 24 hour  Intake 340 ml  Output 950 ml  Net -610 ml      Physical Exam    General: NAD Neck: No JVD, no thyromegaly or thyroid nodule.  Lungs: Decreased BS at bases.  CV: Nondisplaced PMI.  Heart regular S1/S2, no S3/S4, no murmur.  No peripheral edema.  Abdomen: Soft, nontender, no hepatosplenomegaly, no distention.  Skin: Intact without lesions or rashes.  Neurologic: Alert and oriented x 3.  Psych: Normal affect. Extremities: No clubbing or cyanosis.  HEENT: Normal.    Telemetry   A-V sequential pacing (personally reviewed).   Labs    CBC Recent Labs    09/24/18 0229 09/25/18 0552  WBC 7.0 6.0  NEUTROABS 5.7 4.8  HGB 9.9* 9.4*  HCT 31.3* 29.4*  MCV 91.8 90.5  PLT 181 841   Basic Metabolic Panel Recent Labs    09/23/18 0209 09/24/18 0229 09/25/18 0552  NA 138 140 136  K 4.1 3.7 3.4*  CL 102 103 97*  CO2 17* 23 23  GLUCOSE 119* 122* 102*  BUN  91* 85* 80*  CREATININE 3.66* 3.32* 2.99*  CALCIUM 9.0 8.7* 8.4*  PHOS 4.0  --   --    Liver Function Tests Recent Labs    09/22/18 1933 09/23/18 0209  AST 20  --   ALT 13  --   ALKPHOS 66  --   BILITOT 1.1  --   PROT 7.9  --   ALBUMIN 3.5 3.2*   No results for input(s): LIPASE, AMYLASE in the last 72 hours. Cardiac Enzymes Recent Labs    09/23/18 1708 09/23/18 1930 09/24/18 0229  TROPONINI 0.16* 0.16* 0.15*    BNP: BNP (last 3 results) Recent Labs    06/25/18 1209 09/22/18 1857  BNP 800.3* 1,259.5*    ProBNP (last 3 results) No results for input(s): PROBNP in the last 8760 hours.   D-Dimer No results for input(s): DDIMER in the last 72 hours. Hemoglobin A1C No results for input(s): HGBA1C in the last 72 hours. Fasting Lipid Panel No results for input(s): CHOL, HDL, LDLCALC, TRIG, CHOLHDL, LDLDIRECT in the last 72 hours. Thyroid Function Tests No results for input(s): TSH, T4TOTAL, T3FREE, THYROIDAB in the last 72 hours.  Invalid input(s): FREET3  Other results:   Imaging    Ct Chest Wo Contrast  Result Date: 09/24/2018 CLINICAL DATA:  Abnormal chest x-ray with possible mass in the right upper lobe. EXAM: CT CHEST WITHOUT CONTRAST TECHNIQUE: Multidetector CT imaging of the chest was performed following the standard protocol without IV contrast. COMPARISON:  Chest x-rays dated 09/23/2018 and 09/22/2018 and chest CT dated 05/03/2008 FINDINGS: Cardiovascular: Aortic atherosclerosis. Pacemaker in place. Borderline cardiomegaly. No pericardial effusion. CABG. Mediastinum/Nodes: No enlarged mediastinal or axillary lymph nodes. Thyroid gland, trachea, and esophagus demonstrate no significant findings. Lungs/Pleura: There is a multiloculated right pleural effusion. There is an anterolateral loculation of fluid adjacent to the right upper lobe which simulates a mass on chest x-ray. There is also fluid loculated medially at the right lung apex adjacent to the  mediastinum as well as posterolaterally right upper chest. There is a slightly irregular loculated fluid collection posterior medially in the right upper lobe as well. There is a small amount of fluid loculated along the superior aspect of the left major fissure. There is also a small amount of fluid chronically loculated adjacent to the descending thoracic aorta at the level of the left mainstem bronchus extending inferiorly. This is unchanged since the prior CT scan. There are multiple pleural based calcifications bilateral. No consolidative infiltrates. There is a poorly defined 7 mm density in the right lower lobe on image 79 of series 6 which is unchanged since the prior exam of 2009. Upper Abdomen: No significant abnormality. IMPRESSION: 1. No significant lung mass. The density seen on chest x-ray of 09/23/2018 represents loculated pleural fluid adjacent to the right upper lobe. 2. Multiple loculated areas of pleural of fluid adjacent to the right upper lobe and to a lesser degree in the left hemithorax. 3.  Aortic Atherosclerosis (ICD10-I70.0). 4. Stable 7 mm density in the right lower lobe, unchanged since 2009. No follow-up is necessary. Electronically Signed   By: Lorriane Shire M.D.   On: 09/24/2018 13:28     Medications:     Scheduled Medications:  amiodarone  100 mg Oral Daily   apixaban  2.5 mg Oral BID   carvedilol  3.125 mg Oral BID WC   sodium bicarbonate  650 mg Oral TID   torsemide  40 mg Oral Daily    Infusions:  ampicillin (OMNIPEN) IV 2 g (09/25/18 0310)   sulfamethoxazole-trimethoprim 390 mg of trimethoprim (09/24/18 2209)    PRN Medications: acetaminophen **OR** acetaminophen    Assessment/Plan   1. 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  He has been started on ampicillin. He denies abdominal discomfort/diarrhea, has not been eating out.  He was confused/disoriented at admission but clear today without localizing CNS symptoms/signs.  - Continue ampicillin  + Bactrim, needs 3 wks treatment per ID.  Need to be careful with Bactrim dosing given renal dysfunction.  - Per ID, think we can hold off on further CNS imaging as delirium has cleared up and no worrisome signs/symptoms of meningoencephalitis.  He will be treated with 3 wks abx regardless.  2. Chronic systolic UUV:[id number] cardiomyopathy. Echo in 1/19 showed EF 35-40% (stable from most recent prior). Has [employer] CRT-P device. On device check on 08/06/18, he was appropriately BiV pacing with extremely rare AF, thoracic impedance was low.  I recently increased his torsemide to 40 mg daily for volume overload.  Per his wife, he has been losing weigh. Torsemide restarted, volume looks ok on exam today.   - Continue torsemide 40 mg daily.  - Continue Coreg 3.125 mg bid, increase to home 6.25 mg bid if BP remains stable.  - Off spiro  with increased creatinine and potassium. - Not on ACEI/ARB/ARNI with CKD. 3. Atrial fibrillation: Paroxysmal. <1% atrial fibrillation on recent device check.  - Continue amiodarone 100 mg daily. - He is on Eliquis 2.5 mg bid (age, elevated creatinine).  4. CAD:H/o CABG.  No chest pain.Mild troponin elevation, no trend.  - Suspect demand ischemia in setting of hypotension/infection and CKD stage IV.  -Refuses statin. - He is not on ASA given use of Eliquis and stable CAD.  5. Sick sinus syndrome: s/p CRT-P.No change. 6. AKI on CKD, stage RF:VOHKGOVPCH back to baseline.  7. Abnormal CXR: Mass near right major fissure, ?loculated pleural fluid but recommend CT.  CT showed no mass, it is loculated pleural fluid.  Length of Stay: 2  [name], MD  09/25/2018, 8:39 AM  Advanced Heart Failure Team Pager [phone] (M-F; 7a - 4p)  Please contact [facility] for night-coverage after hours (4p -7a ) and weekends on [email or web address]

## 2018-09-25 NOTE — Progress Notes (Signed)
PROGRESS NOTE  Marcus [name] UMP:[id number] DOB: 01-31-1929 DOA: 09/22/2018 PCP: Patient, No Pcp Per   LOS: 2 days   Brief narrative: Marcus [name] is a 85 y.o. male with medical history significant of paroxysmal atrial fibrillation, sick sinus syndrome status post PPM, ischemic cardiomyopathy with EF 35 to 40%, CKD stage III, CAD status post CABG and PCIs, DVT, hypertension, hyperlipidemia, peripheral vascular disease, presented to the hospital for evaluation of altered mental status and generalized weakness.  Home health nurse reported temperature of 99.4 F to cardiology office who then advised patient to come into the hospital for further evaluation. Patient's wife has been concerned that he has not been acting right for the past 2 weeks with generalized weakness.   Subjective: Today, patient denies interval complaints.  Denies any fever, chills or rigor.  Communicative.  Denies nausea, vomiting or diarrhea.  Seen after PICC line placement.  Assessment/Plan:  Principal Problem:   CAP (community acquired pneumonia) Active Problems:   Chronic systolic CHF (congestive heart failure) (HCC)   Elevated troponin   AKI (acute kidney injury) ([place])   CKD (chronic kidney disease) stage 4, GFR 15-29 ml/min (HCC)   Bacteremia  Possible right lower lobe pneumonia.  Improved.  Asymptomatic at this time.  Follow cultures.  Covid test negative.  Continue current antibiotics.  Listeria monocytogenes bacteremia.  Has been seen by infectious disease.  Status post PICC line placement.  I spoke with the patient as well as patient's wife about the need for prolonged IV antibiotics.  Patient and the patient's wife does not wish to see with hemodialysis or any aggressive measures in the future regarding CKD.  I also spoke with nephrology Dr. Carolin Sicks regarding this.  The plan was to proceed with a PICC line placement for IV antibiotic.  Acute on chronic systolic heart failure.  Compensated.  History of  ischemic cardiomyopathy with ejection fraction of 35 to 40% from 2D echocardiogram on 06/08/2018.  Has a pacer in place as well.  Cardiology on board.  Not on ACE ARB with CKD.  Off spironolactone due to increased creatinine and potassium.  Continue on Coreg and torsemide.  Hypokalemia. Mild. Will replenish.   AKI on CKD stage 3. His base cr is 3.3 on admission at 3.7.  His creatinine level is 2.9. Will continue to monitor.  History of atrial fibrillation, paroxysmal.  Continue amiodarone and apixaban.  Currently paced.  Elevated troponin, possible due to acute on chronic CHF.  History of coronary artery disease CABG.  Likely demand ischemia.  No chest pain.  No further work-up.  Continue Eliquis.  Refusing statins.  Not on aspirin due to being on Eliquis and stable CAD.  Generalized weakness.  Physical therapy has seen the patient and recommended home PT on discharge.  VTE Prophylaxis: Apixaban   Code Status: DNR  Family Communication: I again spoke with the patient's wife on the phone and updated her about the clinical condition of the patient.  We spoke about PICC line placement and prolonged antibiotics.  Disposition Plan: Home likely by tomorrow if ok with ID and OP antibiotics arranged.   Consultants:  Infectious disease, cardiology  Procedures:   PICC line placement 09/25/2018  Antibiotics: Anti-infectives (From admission, onward)   Start     Dose/Rate Route Frequency Ordered Stop   09/25/18 1030  sulfamethoxazole-trimethoprim (BACTRIM DS) 800-160 MG per tablet 2 tablet     2 tablet Oral Every 12 hours 09/25/18 1005     09/24/18 1400  sulfamethoxazole-trimethoprim (BACTRIM) 390 mg of trimethoprim in dextrose 5 % 500 mL IVPB  Status:  Discontinued     390 mg of trimethoprim 349.6 mL/hr over 90 Minutes Intravenous Every 12 hours 09/24/18 1213 09/24/18 1214   09/24/18 1300  sulfamethoxazole-trimethoprim (BACTRIM) 390 mg of trimethoprim in dextrose 5 % 500 mL IVPB  Status:   Discontinued     390 mg of trimethoprim 349.6 mL/hr over 90 Minutes Intravenous Every 12 hours 09/24/18 1214 09/25/18 1005   09/24/18 1000  azithromycin (ZITHROMAX) tablet 500 mg  Status:  Discontinued     500 mg Oral Daily 09/23/18 0854 09/23/18 1755   09/23/18 1900  ampicillin (OMNIPEN) 2 g in sodium chloride 0.9 % 100 mL IVPB     2 g 300 mL/hr over 20 Minutes Intravenous Every 8 hours 09/23/18 1755     09/23/18 0300  azithromycin (ZITHROMAX) 500 mg in sodium chloride 0.9 % 250 mL IVPB  Status:  Discontinued     500 mg 250 mL/hr over 60 Minutes Intravenous Every 24 hours 09/23/18 0137 09/23/18 0854   09/23/18 0200  cefTRIAXone (ROCEPHIN) 1 g in sodium chloride 0.9 % 100 mL IVPB  Status:  Discontinued     1 g 200 mL/hr over 30 Minutes Intravenous Every 24 hours 09/23/18 0137 09/23/18 1755      Objective: Vitals:   09/25/18 0928 09/25/18 1300  BP:    Pulse: 75 76  Resp: 17 (!) 27  Temp:    SpO2: 99% 99%    Intake/Output Summary (Last 24 hours) at 09/25/2018 1413 Last data filed at 09/25/2018 1300 Gross per 24 hour  Intake 460 ml  Output 950 ml  Net -490 ml   Filed Weights   09/23/18 0150 09/24/18 0300 09/25/18 0321  Weight: 77.5 kg 78.6 kg 81.2 kg   Body mass index is 26.44 kg/m.   Physical Exam: GENERAL: Patient is alert awake and communicative. Not in obvious distress. HENT: No scleral pallor or icterus. Pupils equally reactive to light. Oral mucosa is moist NECK: is supple, no palpable thyroid enlargement. CHEST: Clear to auscultation. No crackles or wheezes. Non tender on palpation. Diminished breath sounds bilaterally. CVS: S1 and S2 heard, no murmur. Regular rate and rhythm. No pericardial rub. ABDOMEN: Soft, non-tender, bowel sounds are present. No palpable hepato-splenomegaly. EXTREMITIES: Trace edema CNS: Cranial nerves are intact. No focal motor or sensory deficits. SKIN: warm and dry without rashes.  Data Review: I have personally reviewed the following  laboratory data and studies,  CBC: Recent Labs  Lab 09/22/18 1857 09/24/18 0229 09/25/18 0552  WBC 9.7 7.0 6.0  NEUTROABS  --  5.7 4.8  HGB 10.7* 9.9* 9.4*  HCT 34.7* 31.3* 29.4*  MCV 96.7 91.8 90.5  PLT 162 181 902   Basic Metabolic Panel: Recent Labs  Lab 09/22/18 1933 09/23/18 0209 09/24/18 0229 09/25/18 0552  NA 137 138 140 136  K 3.8 4.1 3.7 3.4*  CL 101 102 103 97*  CO2 16* 17* 23 23  GLUCOSE 119* 119* 122* 102*  BUN 91* 91* 85* 80*  CREATININE 3.77* 3.66* 3.32* 2.99*  CALCIUM 9.3 9.0 8.7* 8.4*  PHOS  --  4.0  --   --    Liver Function Tests: Recent Labs  Lab 09/22/18 1933 09/23/18 0209  AST 20  --   ALT 13  --   ALKPHOS 66  --   BILITOT 1.1  --   PROT 7.9  --   ALBUMIN 3.5 3.2*  No results for input(s): LIPASE, AMYLASE in the last 168 hours. No results for input(s): AMMONIA in the last 168 hours. Cardiac Enzymes: Recent Labs  Lab 09/23/18 0209 09/23/18 0711 09/23/18 1708 09/23/18 1930 09/24/18 0229  TROPONINI 0.10* 0.12* 0.16* 0.16* 0.15*   BNP (last 3 results) Recent Labs    06/25/18 1209 09/22/18 1857  BNP 800.3* 1,259.5*    ProBNP (last 3 results) No results for input(s): PROBNP in the last 8760 hours.  CBG: No results for input(s): GLUCAP in the last 168 hours. Recent Results (from the past 240 hour(s))  Culture, blood (routine x 2)     Status: Abnormal   Collection Time: 09/22/18  7:28 PM  Result Value Ref Range Status   Specimen Description BLOOD RIGHT ANTECUBITAL  Final   Special Requests   Final    BOTTLES DRAWN AEROBIC AND ANAEROBIC Blood Culture adequate volume   Culture  Setup Time (A)  Final    GRAM VARIABLE ROD Organism ID to follow CRITICAL RESULT CALLED TO, READ BACK BY AND VERIFIED WITH: Hughie Closs [facility] 09/23/18 1741 JDW    Culture (A)  Final    LISTERIA MONOCYTOGENES Standardized susceptibility testing for this organism is not available. HEALTH DEPARTMENT NOTIFIED Performed at [facility], [place]  [address] [place], [place] [address]    Report Status 09/25/2018 FINAL  Final  Blood Culture ID Panel (Reflexed)     Status: Abnormal   Collection Time: 09/22/18  7:28 PM  Result Value Ref Range Status   Enterococcus species NOT DETECTED NOT DETECTED Final   Vancomycin resistance NOT DETECTED NOT DETECTED Final   Listeria monocytogenes DETECTED (A) NOT DETECTED Final    Comment: CRITICAL RESULT CALLED TO, READ BACK BY AND VERIFIED WITH: Hughie Closs PHARMD 09/23/18 1741 JDW    Staphylococcus species NOT DETECTED NOT DETECTED Final   Staphylococcus aureus (BCID) NOT DETECTED NOT DETECTED Final   Methicillin resistance NOT DETECTED NOT DETECTED Final   Streptococcus species NOT DETECTED NOT DETECTED Final   Streptococcus agalactiae NOT DETECTED NOT DETECTED Final   Streptococcus pneumoniae NOT DETECTED NOT DETECTED Final   Streptococcus pyogenes NOT DETECTED NOT DETECTED Final   Acinetobacter baumannii NOT DETECTED NOT DETECTED Final   Enterobacteriaceae species NOT DETECTED NOT DETECTED Final   Enterobacter cloacae complex NOT DETECTED NOT DETECTED Final   Escherichia coli NOT DETECTED NOT DETECTED Final   Klebsiella oxytoca NOT DETECTED NOT DETECTED Final   Klebsiella pneumoniae NOT DETECTED NOT DETECTED Final   Proteus species NOT DETECTED NOT DETECTED Final   Serratia marcescens NOT DETECTED NOT DETECTED Final   Carbapenem resistance NOT DETECTED NOT DETECTED Final   Haemophilus influenzae NOT DETECTED NOT DETECTED Final   Neisseria meningitidis NOT DETECTED NOT DETECTED Final   Pseudomonas aeruginosa NOT DETECTED NOT DETECTED Final   Candida albicans NOT DETECTED NOT DETECTED Final   Candida glabrata NOT DETECTED NOT DETECTED Final   Candida krusei NOT DETECTED NOT DETECTED Final   Candida parapsilosis NOT DETECTED NOT DETECTED Final   Candida tropicalis NOT DETECTED NOT DETECTED Final    Comment: Performed at [facility], [place]. [address], [place], [place] [address]  Culture, blood  (routine x 2)     Status: Abnormal   Collection Time: 09/22/18  7:47 PM  Result Value Ref Range Status   Specimen Description BLOOD LEFT ANTECUBITAL  Final   Special Requests   Final    BOTTLES DRAWN AEROBIC ONLY Blood Culture adequate volume  Culture  Setup Time   Final    AEROBIC BOTTLE ONLY GRAM VARIABLE ROD CRITICAL VALUE NOTED.  VALUE IS CONSISTENT WITH PREVIOUSLY REPORTED AND CALLED VALUE.    Culture (A)  Final    LISTERIA MONOCYTOGENES Standardized susceptibility testing for this organism is not available. HEALTH DEPARTMENT NOTIFIED Performed at [facility], [place] [address], [place], [place] [address]    Report Status 09/25/2018 FINAL  Final  SARS Coronavirus 2 (CEPHEID - Performed in [place] hospital lab), Hosp Order     Status: None   Collection Time: 09/22/18  7:47 PM  Result Value Ref Range Status   SARS Coronavirus 2 NEGATIVE NEGATIVE Final    Comment: (NOTE) If result is NEGATIVE SARS-CoV-2 target nucleic acids are NOT DETECTED. The SARS-CoV-2 RNA is generally detectable in upper and lower  respiratory specimens during the acute phase of infection. The lowest  concentration of SARS-CoV-2 viral copies this assay can detect is 250  copies / mL. A negative result does not preclude SARS-CoV-2 infection  and should not be used as the sole basis for treatment or other  patient management decisions.  A negative result may occur with  improper specimen collection / handling, submission of specimen other  than nasopharyngeal swab, presence of viral mutation(s) within the  areas targeted by this assay, and inadequate number of viral copies  (<250 copies / mL). A negative result must be combined with clinical  observations, patient history, and epidemiological information. If result is POSITIVE SARS-CoV-2 target nucleic acids are DETECTED. The SARS-CoV-2 RNA is generally detectable in upper and lower  respiratory specimens dur ing the acute phase of infection.   Positive  results are indicative of active infection with SARS-CoV-2.  Clinical  correlation with patient history and other diagnostic information is  necessary to determine patient infection status.  Positive results do  not rule out bacterial infection or co-infection with other viruses. If result is PRESUMPTIVE POSTIVE SARS-CoV-2 nucleic acids MAY BE PRESENT.   A presumptive positive result was obtained on the submitted specimen  and confirmed on repeat testing.  While 2019 novel coronavirus  (SARS-CoV-2) nucleic acids may be present in the submitted sample  additional confirmatory testing may be necessary for epidemiological  and / or clinical management purposes  to differentiate between  SARS-CoV-2 and other Sarbecovirus currently known to infect humans.  If clinically indicated additional testing with an alternate test  methodology [phone]) is advised. The SARS-CoV-2 RNA is generally  detectable in upper and lower respiratory sp ecimens during the acute  phase of infection. The expected result is Negative. Fact Sheet for Patients:  StrictlyIdeas.no Fact Sheet for Healthcare Providers: BankingDealers.co.za This test is not yet approved or cleared by the [place] FDA and has been authorized for detection and/or diagnosis of SARS-CoV-2 by FDA under an Emergency Use Authorization (EUA).  This EUA will remain in effect (meaning this test can be used) for the duration of the COVID-19 declaration under Section 564(b)(1) of the Act, 21 U.S.C. section 360bbb-3(b)(1), unless the authorization is terminated or revoked sooner. Performed at [facility], [place] [address], [place], [place] [address]   MRSA PCR Screening     Status: None   Collection Time: 09/23/18  2:31 AM  Result Value Ref Range Status   MRSA by PCR NEGATIVE NEGATIVE Final    Comment:        The GeneXpert MRSA Assay (FDA approved for NASAL specimens only), is  one component of  a comprehensive MRSA colonization surveillance program. It is not intended to diagnose MRSA infection nor to guide or monitor treatment for MRSA infections. Performed at [facility], [place] [address] [place], [place] [address]      Studies: Ct Chest Wo Contrast  Result Date: 09/24/2018 CLINICAL DATA:  Abnormal chest x-ray with possible mass in the right upper lobe. EXAM: CT CHEST WITHOUT CONTRAST TECHNIQUE: Multidetector CT imaging of the chest was performed following the standard protocol without IV contrast. COMPARISON:  Chest x-rays dated 09/23/2018 and 09/22/2018 and chest CT dated 05/03/2008 FINDINGS: Cardiovascular: Aortic atherosclerosis. Pacemaker in place. Borderline cardiomegaly. No pericardial effusion. CABG. Mediastinum/Nodes: No enlarged mediastinal or axillary lymph nodes. Thyroid gland, trachea, and esophagus demonstrate no significant findings. Lungs/Pleura: There is a multiloculated right pleural effusion. There is an anterolateral loculation of fluid adjacent to the right upper lobe which simulates a mass on chest x-ray. There is also fluid loculated medially at the right lung apex adjacent to the mediastinum as well as posterolaterally right upper chest. There is a slightly irregular loculated fluid collection posterior medially in the right upper lobe as well. There is a small amount of fluid loculated along the superior aspect of the left major fissure. There is also a small amount of fluid chronically loculated adjacent to the descending thoracic aorta at the level of the left mainstem bronchus extending inferiorly. This is unchanged since the prior CT scan. There are multiple pleural based calcifications bilateral. No consolidative infiltrates. There is a poorly defined 7 mm density in the right lower lobe on image 79 of series 6 which is unchanged since the prior exam of 2009. Upper Abdomen: No significant abnormality. IMPRESSION: 1. No significant lung  mass. The density seen on chest x-ray of 09/23/2018 represents loculated pleural fluid adjacent to the right upper lobe. 2. Multiple loculated areas of pleural of fluid adjacent to the right upper lobe and to a lesser degree in the left hemithorax. 3.  Aortic Atherosclerosis (ICD10-I70.0). 4. Stable 7 mm density in the right lower lobe, unchanged since 2009. No follow-up is necessary. Electronically Signed   By: Lorriane Shire M.D.   On: 09/24/2018 13:28   Dg Chest Port 1v Same Day  Result Date: 09/25/2018 CLINICAL DATA:  PICC line placement. EXAM: PORTABLE CHEST 1 VIEW COMPARISON:  Chest x-ray dated Sep 23, 2018. FINDINGS: Interval placement of a right upper extremity PICC line with the tip in the distal SVC. Unchanged left chest wall pacemaker. Stable cardiomegaly. Mildly improved interstitial pulmonary edema. Vague hazy opacity over the peripheral right upper lobe is unchanged. Suspected small loculated right pleural effusion is also unchanged. Probable trace left pleural effusion, unchanged. No consolidation or pneumothorax. Unchanged scattered calcified pleural plaques. No acute osseous abnormality. IMPRESSION: 1. Right upper extremity PICC line with tip in the distal SVC. 2. Mildly improved interstitial pulmonary edema. 3. Unchanged small loculated right pleural effusion with suspected fluid in the right major fissure. Electronically Signed   By: Titus Dubin M.D.   On: 09/25/2018 13:15   [place] Ekg Site Rite  Result Date: 09/25/2018 If Site Rite image not attached, placement could not be confirmed due to current cardiac rhythm.   Scheduled Meds: . amiodarone  100 mg Oral Daily  . apixaban  2.5 mg Oral BID  . carvedilol  3.125 mg Oral BID WC  . sodium bicarbonate  650 mg Oral TID  . sodium chloride flush  10-40 mL Intracatheter Q12H  . sulfamethoxazole-trimethoprim  2 tablet Oral  Q12H  . torsemide  40 mg Oral Daily    Continuous Infusions: . ampicillin (OMNIPEN) IV 2 g (09/25/18 1356)     Flora Lipps, MD  [facility] 09/25/2018

## 2018-09-25 NOTE — Progress Notes (Signed)
PT Cancellation Note  Patient Details Name: Marcus [name] MRN: [id number] DOB: 1929-03-11   Cancelled Treatment:    Reason Eval/Treat Not Completed: Patient declined, no reason specified - Pt reports being too fatigued to mobilize, refusing x2 with max verbal encouragement from PT. Pt states "Come back tomorrow, and come in the morning".   Julien Girt, PT Acute Rehabilitation Services Pager [phone]  Office [place] 09/25/2018, 3:40 PM

## 2018-09-25 NOTE — Progress Notes (Signed)
Peripherally Inserted Central Catheter/Midline Placement  The IV Nurse has discussed with the patient and/or persons authorized to consent for the patient, the purpose of this procedure and the potential benefits and risks involved with this procedure.  The benefits include less needle sticks, lab draws from the catheter, and the patient may be discharged home with the catheter. Risks include, but not limited to, infection, bleeding, blood clot (thrombus formation), and puncture of an artery; nerve damage and irregular heartbeat and possibility to perform a PICC exchange if needed/ordered by physician.  Alternatives to this procedure were also discussed.  Bard Power PICC patient education guide, fact sheet on infection prevention and patient information card has been provided to patient /or left at bedside.    PICC/Midline Placement Documentation  PICC Double Lumen 09/25/18 PICC Right 37 cm (Active)  Indication for Insertion or Continuance of Line Prolonged intravenous therapies 09/25/2018 11:00 AM  Site Assessment Clean;Dry;Intact 09/25/2018 11:00 AM  Lumen #1 Status Flushed;Blood return noted 09/25/2018 11:00 AM  Lumen #2 Status Flushed;Blood return noted 09/25/2018 11:00 AM  Dressing Type Transparent 09/25/2018 11:00 AM  Dressing Status Clean;Dry;Intact;Antimicrobial disc in place 09/25/2018 11:00 AM  Dressing Change Due 10/02/18 09/25/2018 11:00 AM    Telephone consent   Marcus [name] 09/25/2018, 11:51 AM

## 2018-09-26 ENCOUNTER — Encounter (HOSPITAL_COMMUNITY): Payer: Medicare Other | Admitting: Cardiology

## 2018-09-26 LAB — CBC WITH DIFFERENTIAL/PLATELET
Abs Immature Granulocytes: 0.03 10*3/uL (ref 0.00–0.07)
Basophils Absolute: 0 10*3/uL (ref 0.0–0.1)
Basophils Relative: 0 %
Eosinophils Absolute: 0.3 10*3/uL (ref 0.0–0.5)
Eosinophils Relative: 4 %
HCT: 27.6 % — ABNORMAL LOW (ref 39.0–52.0)
Hemoglobin: 8.8 g/dL — ABNORMAL LOW (ref 13.0–17.0)
Immature Granulocytes: 1 %
Lymphocytes Relative: 8 %
Lymphs Abs: 0.4 10*3/uL — ABNORMAL LOW (ref 0.7–4.0)
MCH: 28.8 pg (ref 26.0–34.0)
MCHC: 31.9 g/dL (ref 30.0–36.0)
MCV: 90.2 fL (ref 80.0–100.0)
Monocytes Absolute: 0.6 10*3/uL (ref 0.1–1.0)
Monocytes Relative: 11 %
Neutro Abs: 4.4 10*3/uL (ref 1.7–7.7)
Neutrophils Relative %: 76 %
Platelets: 195 10*3/uL (ref 150–400)
RBC: 3.06 MIL/uL — ABNORMAL LOW (ref 4.22–5.81)
RDW: 14.9 % (ref 11.5–15.5)
WBC: 5.7 10*3/uL (ref 4.0–10.5)
nRBC: 0 % (ref 0.0–0.2)

## 2018-09-26 LAB — BASIC METABOLIC PANEL
Anion gap: 12 (ref 5–15)
BUN: 72 mg/dL — ABNORMAL HIGH (ref 8–23)
CO2: 23 mmol/L (ref 22–32)
Calcium: 8.4 mg/dL — ABNORMAL LOW (ref 8.9–10.3)
Chloride: 100 mmol/L (ref 98–111)
Creatinine, Ser: 3.24 mg/dL — ABNORMAL HIGH (ref 0.61–1.24)
GFR calc Af Amer: 19 mL/min — ABNORMAL LOW (ref >60)
GFR calc non Af Amer: 16 mL/min — ABNORMAL LOW (ref >60)
Glucose, Bld: 125 mg/dL — ABNORMAL HIGH (ref 70–99)
Potassium: 3.7 mmol/L (ref 3.5–5.1)
Sodium: 135 mmol/L (ref 135–145)

## 2018-09-26 MED ORDER — POTASSIUM CHLORIDE CRYS ER 20 MEQ PO TBCR: 20.0000 meq | EXTENDED_RELEASE_TABLET | Freq: Every day | ORAL | 0 refills | Status: DC

## 2018-09-26 MED ORDER — POTASSIUM CHLORIDE 10 MEQ/100ML IV SOLN
INTRAVENOUS | Status: AC
  Filled 2018-09-26: qty 100

## 2018-09-26 MED ORDER — CARVEDILOL 6.25 MG PO TABS: 6.2500 mg | ORAL_TABLET | Freq: Two times a day (BID) | ORAL | Status: DC

## 2018-09-26 MED ORDER — ACETAMINOPHEN 325 MG PO TABS: 650.0000 mg | ORAL_TABLET | Freq: Four times a day (QID) | ORAL | 0 refills | Status: AC | PRN

## 2018-09-26 MED ORDER — AMPICILLIN IV (FOR PTA / DISCHARGE USE ONLY): 2.0000 g | Freq: Three times a day (TID) | INTRAVENOUS | 0 refills | Status: DC

## 2018-09-26 MED ORDER — CARVEDILOL 6.25 MG PO TABS
6.2500 mg | ORAL_TABLET | Freq: Two times a day (BID) | ORAL | Status: DC
  Administered 2018-09-26: 6.25 mg via ORAL

## 2018-09-26 NOTE — TOC Progression Note (Signed)
Transition of Care [facility]) - Progression Note    Patient Details  Name: JONATHYN CAROTHERS MRN: [id number] Date of Birth: 05-Sep-1928  Transition of [facility]) CM/SW Contact  Maryclare Labrador, RN Phone Number: 09/26/2018, 8:55 AM  Clinical Narrative:   Pt will discharge with IV antibiotics.  Medicare.gov list provided - pt chose [facility] for infusion and [facility] for [facility] - both agencies accepted pt    Expected Discharge Plan: [place] Barriers to Discharge: Continued Medical Work up  Expected Discharge Plan and Services Expected Discharge Plan: [place] In-house Referral: Clinical Social Work Discharge Planning Services: NA Post Acute Care Choice: Home Health, Resumption of Svcs/PTA Provider Living arrangements for the past 2 months: Single Family Home                 DME Arranged: IV pump/equipment DME Agency: [place]) Date DME Agency Contacted: 09/25/18 Time DME Agency Contacted: 1220 Representative spoke with at DME Agency: Referral given by attending group to [place] with [facility] HH Arranged: RN, PT [facility] Agency: [place] ([place]) Date HH Agency Contacted: 09/25/18 Time [place]: [id number] Representative spoke with at [place]: [place] ([place]) Interventions    Readmission Risk Interventions Readmission Risk Prevention Plan 09/25/2018  Transportation Screening Complete  PCP or Specialist Appt within 3-5 Days Complete  HRI or [place] Complete  Social Work Consult for [place] Planning/Counseling [place] Not Applicable  Medication Review [employer]) Complete  Some recent data might be hidden

## 2018-09-26 NOTE — Progress Notes (Signed)
Physical Therapy Treatment Patient Details Name: Marcus [name] MRN: [id number] DOB: 07-May-1929 Today's Date: 09/26/2018    History of Present Illness  Marcus [name] is a 85 y.o. male with medical history significant of paroxysmal atrial fibrillation, sick sinus syndrome status post PPM, ischemic cardiomyopathy with EF 35 to 40%, CKD stage III, CAD status post CABG and PCIs, DVT, hypertension, hyperlipidemia, peripheral vascular disease, and conditions listed below presenting to the hospital for evaluation of altered mental status and generalized weakness.    PT Comments    Pt performed gait training and functional mobility during session this pm.  He was insistent on utilizing IV pole and PTA required repeated education on why we needed to practice with out the IV pole or use a RW that he can take home.  Pt required moderate assistance without device and required use of RW for continued gait training.  He required min guard overall with min assistance for turns and backing in tight spaces.  Pt presents with poor safety awareness and wil require max HH services at d/c as he is refusing placement to SNF at this time.  Plan for retrial of stair training next session to prepare for return home.    Follow Up Recommendations  Home health PT;Supervision/Assistance - 24 hour(He is refusing SNF placement)     Equipment Recommendations  Rolling walker with 5" wheels    Recommendations for Other Services       Precautions / Restrictions Precautions Precautions: Fall Precaution Comments: confusion Restrictions Weight Bearing Restrictions: No    Mobility  Bed Mobility Overal bed mobility: Needs Assistance Bed Mobility: Sit to Supine     Supine to sit: Mod assist     General bed mobility comments: Pt required assistance to lower trunk and lift B LEs into bed.  Use of rails to pull himself to [facility] in supine position.    Transfers Overall transfer level: Needs assistance Equipment  used: None;Rolling walker (2 wheeled)(attempted short trial without device.  Pt required return to bed and RW used for longer gt trial.  ) Transfers: Sit to/from Stand Sit to Stand: Min guard         General transfer comment: Impulsive to stand with min assistance to maintain balance in standing.    Ambulation/Gait Ambulation/Gait assistance: Mod assist;Min assist Gait Distance (Feet): 160 Feet(1st trial of 12 ft without device and require moderate assistance. ) Assistive device: Rolling walker (2 wheeled);None Gait Pattern/deviations: Step-through pattern;Trunk flexed Gait velocity: slower to moderate   General Gait Details: Pt very unsteady without device.  He insisted on pushing IV pole but PTA educated he would not have an IV pole at home.  Pt agreeable to use RW and balance improved.  Pt min guard to min ( mostly turns and backing ) with RW.  He required moderate assistance with out UE support.     Stairs Stairs: Yes Stairs assistance: Min assist Stair Management: No rails Number of Stairs: 2 General stair comments: Pt negotiated x1 with cues for RW placement and sequencing.  Pt unable to negotiate 2nd step due to poor strength.     Wheelchair Mobility    Modified Rankin (Stroke Patients Only)       Balance Overall balance assessment: Needs assistance Sitting-balance support: Feet supported;No upper extremity supported Sitting balance-Leahy Scale: Good       Standing balance-Leahy Scale: Poor  Cognition Arousal/Alertness: Awake/alert Behavior During Therapy: WFL for tasks assessed/performed Overall Cognitive Status: Impaired/Different from baseline Area of Impairment: Memory;Safety/judgement;Problem solving                 Orientation Level: Disoriented to;Situation;Time   Memory: Decreased short-term memory Following Commands: Follows one step commands with increased time;Follows one step commands  inconsistently Safety/Judgement: Decreased awareness of safety;Decreased awareness of deficits   Problem Solving: Slow processing;Difficulty sequencing General Comments: Pt with unrealistic expectations of his abilities to return home safely.        Exercises      General Comments        Pertinent Vitals/Pain Pain Assessment: Faces Faces Pain Scale: Hurts even more Pain Location: back pain but clutching his chest Pain Descriptors / Indicators: Grimacing;Guarding Pain Intervention(s): Monitored during session;Repositioned    Home Living                      Prior Function            PT Goals (current goals can now be found in the care plan section) Acute Rehab PT Goals Patient Stated Goal: did not state Potential to Achieve Goals: Good Progress towards PT goals: Progressing toward goals    Frequency    Min 3X/week      PT Plan Current plan remains appropriate    Co-evaluation              AM-PAC PT "6 Clicks" Mobility   Outcome Measure  Help needed turning from your back to your side while in a flat bed without using bedrails?: A Little Help needed moving from lying on your back to sitting on the side of a flat bed without using bedrails?: A Little Help needed moving to and from a bed to a chair (including a wheelchair)?: A Little Help needed standing up from a chair using your arms (e.g., wheelchair or bedside chair)?: A Little Help needed to walk in hospital room?: A Little Help needed climbing 3-5 steps with a railing? : A Lot 6 Click Score: 17    End of Session Equipment Utilized During Treatment: Gait belt Activity Tolerance: Patient tolerated treatment well Patient left: with call bell/phone within reach;in bed;with bed alarm set Nurse Communication: Mobility status PT Visit Diagnosis: Unsteadiness on feet (R26.81);Muscle weakness (generalized) (M62.81);Difficulty in walking, not elsewhere classified (R26.2)     Time: 7169-6789 PT  Time Calculation (min) (ACUTE ONLY): 36 min  Charges:  $Gait Training: 8-22 mins $Therapeutic Activity: 8-22 mins                    Governor Rooks, PTA Acute Rehabilitation Services Pager [phone] Office [phone]     Veria Stradley Eli Hose 09/26/2018, 4:05 PM

## 2018-09-26 NOTE — Progress Notes (Signed)
Patient being discharged home per MD order, Wife at bedside for discharge instructions including education with Pam from pharmacy on home antibx care. All instructions given in both written and verbal format. Wife voiced understanding and had Pharmacy RN phone number/ card for any additional questions.

## 2018-09-26 NOTE — Discharge Summary (Signed)
Physician Discharge Summary  GRAYSIN LUCZYNSKI MIW:[id number] DOB: [birth year] DOA: 09/22/2018  PCP: Patient, No Pcp Per  Admit date: 09/22/2018 Discharge date: 09/26/2018  Admitted From: Home  Discharge disposition: Home with home health   Recommendations for Outpatient Follow-Up:    Follow up with cardiology and infectious disease as outpatient   Discharge Diagnosis:   Principal Problem:   CAP (community acquired pneumonia) Active Problems:   Chronic systolic CHF (congestive heart failure) (HCC)   Elevated troponin   AKI (acute kidney injury) ([facility])   CKD (chronic kidney disease) stage 4, GFR 15-29 ml/min ([facility])   Bacteremia   Discharge Condition: Improved.  Diet recommendation: Low sodium, heart healthy.    Wound care: None.  Code status: Full.   History of Present Illness:   AIMEE TIMMONS a 85 y.o.malewith medical history significant ofparoxysmal atrial fibrillation, sick sinus syndrome status postPPM, ischemic cardiomyopathy with EF 35 to 40%, CKD stage III, CAD status post CABG and PCIs, DVT, hypertension, hyperlipidemia, peripheral vascular disease, presented to the hospital for evaluation of altered mental status and generalized weakness. Home health nurse reported temperature of99.4 F to cardiology office who then advised patient to come into the hospital for further evaluation. Patient's wife has been concerned that he has not been acting right for the past 2 weeks with generalized weakness.    Hospital Course:  Patient was admitted to hospital and following conditions were addressed during hospitalization,  Possible right lower lobe pneumonia.  Improved.  Asymptomatic at this time.   Covid test negative.    Patient received antibiotics during hospitalization which will be continued on discharge  Listeria monocytogenes bacteremia.  Has been seen by infectious disease.  Status post PICC line placement.    Continue antibiotics as outpatient as per home  meds.  Acute on chronicsystolicheart failure.  Compensated.  History of ischemic cardiomyopathy with ejection fraction of 35 to 40% from 2D echocardiogram on 06/08/2018.  Has a pacer in place as well.  Cardiology followed the patient during hospitalization. Not on ACE ARB with CKD.  Off spironolactone due to increased creatinine and potassium.  Continue on Coreg and torsemide on discharge.  Hypokalemia.  Replenished.  Prescribed potassium for few more days since the patient will be continued on diuretics.  To follow-up BMP within a week.  AKI on CKD stage 3. His base cr is 3.3 on admission at 3.7.  His creatinine level is 3.2 today..  Will need to be monitored as outpatient since the patient will also be on Bactrim.  History of atrial fibrillation, paroxysmal.  Continue amiodarone and apixaban.  Currently paced.  Patient was seen by cardiology during hospitalization.  Elevated troponin, mild acute on chronic CHF.  History of coronary artery disease CABG.  Likely demand ischemia related. No chest pain.  No further work-up was planned..  Continue Eliquis.  Refused statins.  Not on aspirin due to being on Eliquis and stable CAD.  Patient will follow-up with cardiology as outpatient.   Generalized weakness.  Physical therapy has seen the patient and recommended short-term skilled nursing facility but after discussion with the patient as well as patient's wife, decision was home with home health services.    Code Status: DNR  Disposition Plan: Home with home health today.  I had a prolonged discussion with the patient's wife on the phone regarding debility and the benefit of short-term rehabilitation, she voiced discharge home rather than skilled nursing facility.  Will arrange for home health services RN, PT  OT and aide on discharge.  Procedures:    PICC line placement 09/25/2018   Medical Consultants:   Infectious disease, cardiology   Subjective:   Today, patient feels okay.   Denies any shortness of breath, cough, nausea, vomiting or diarrhea.  Discharge Exam:   Vitals:   09/26/18 0934 09/26/18 1154  BP: 101/64 (!) 87/58  Pulse: 79   Resp:  (!) 24  Temp:    SpO2:     Vitals:   09/26/18 0328 09/26/18 0819 09/26/18 0934 09/26/18 1154  BP: 113/64 101/64 101/64 (!) 87/58  Pulse: 78  79   Resp: (!) 23 (!) 23  (!) 24  Temp: 97.9 F (36.6 C) (!) 97.3 F (36.3 C)    TempSrc: Oral Oral    SpO2: 97%     Weight: 80.7 kg     Height:        General exam: Appears calm and comfortable ,Not in distress HEENT:PERRL,Oral mucosa moist Respiratory system: Bilateral equal air entry, normal vesicular breath sounds, no wheezes or crackles  Cardiovascular system: S1 & S2 heard, RRR.  Gastrointestinal system: Abdomen is nondistended, soft and nontender. No organomegaly or masses felt. Normal bowel sounds heard. Central nervous system: Alert awake and communicative.  Has impaired memory.  No focal neurological deficits. Extremities:, no clubbing ,no cyanosis, distal peripheral pulses palpable. Skin: No rashes, lesions or ulcers,no icterus ,no pallor MSK: Moves all extremities.  Mild weakness of all limbs   Procedures:   PICC placement on 09/25/2018   The results of significant diagnostics from this hospitalization (including imaging, microbiology, ancillary and laboratory) are listed below for reference.     Diagnostic Studies:   Dg Chest 2 View  Result Date: 09/23/2018 CLINICAL DATA:  Dyspnea.  Altered mental status. EXAM: CHEST - 2 VIEW COMPARISON:  09/22/2018 FINDINGS: Stable appearance of pacer device and associated leads. Mild cardiomegaly. Prior median sternotomy. Atherosclerotic calcification of the aortic arch. Mild interstitial accentuation is present bilaterally. Chronic retrodiaphragmatic opacity on the right primarily along the posterior pleural margin. Pleural calcification is noted projecting over the cardiac apex. 4.6 cm nodule projects over the upper  portion of the right major fissure and based on prior exams such as the chest CT from 05/03/2008 may represent chronic pleural fluid density collection, although is not entirely specific. No free-flowing pleural effusion is identified. IMPRESSION: 1. Cardiomegaly with interstitial accentuation favoring interstitial edema. 2. 4.6 cm in diameter masslike density in the vicinity of the upper portion of the right major fissure is noted; on prior exams such as the remote prior chest CT from 05/03/2008, there has been loculated pleural fluid and some in the vicinity of the major fissure. No free-flowing pleural effusion is currently identified. Strictly speaking I cannot exclude a right upper lobe lung cancer, and chest CT could be utilized to differentiate. 3. Dense pleural calcifications projecting over the cardiac apex. Electronically Signed   By: Van Clines M.D.   On: 09/23/2018 12:26   Ct Head Wo Contrast  Result Date: 09/22/2018 CLINICAL DATA:  Altered level of consciousness. Weakness. EXAM: CT HEAD WITHOUT CONTRAST TECHNIQUE: Contiguous axial images were obtained from the base of the skull through the vertex without intravenous contrast. COMPARISON:  None. FINDINGS: Brain: There is no evidence of acute infarct, intracranial hemorrhage, mass, midline shift, or extra-axial fluid collection. There is mild cerebral atrophy. Cerebral white matter hypodensities are nonspecific but compatible with mild chronic small vessel ischemic disease. A left choroidal fissure cyst is noted. Vascular: Calcified  atherosclerosis at the skull base. No suspicious vascular hyperdensity. Skull: No fracture or focal osseous lesion. Sinuses/Orbits: Paranasal sinuses and mastoid air cells are clear. Bilateral cataract extraction. Other: None. IMPRESSION: 1. No evidence of acute intracranial abnormality. 2. Mild chronic small vessel ischemic disease. Electronically Signed   By: Logan Bores M.D.   On: 09/22/2018 21:48   US  Renal  Result Date: 09/23/2018 CLINICAL DATA:  Acute kidney injury EXAM: RENAL / URINARY TRACT ULTRASOUND COMPLETE COMPARISON:  07/18/2018 FINDINGS: Right Kidney: Renal measurements: 9.9 x 4.3 x 4.9 cm = volume: 109 mL. Cortical thinning and increased echotexture. No mass or hydronephrosis. Left Kidney: Renal measurements: 9.9 x 3.7 x 4.3 cm = volume: 82 mL. Cortical thinning with increased echotexture. No mass or hydronephrosis. Bladder: Appears normal for degree of bladder distention. IMPRESSION: Stable cortical thinning and increased echotexture compatible with chronic medical renal disease. No hydronephrosis or acute findings. Electronically Signed   By: Rolm Baptise M.D.   On: 09/23/2018 03:25   Dg Chest Portable 1 View  Result Date: 09/22/2018 CLINICAL DATA:  Altered mental status EXAM: PORTABLE CHEST 1 VIEW COMPARISON:  08/04/2014 FINDINGS: Cardiac shadow is mildly enlarged but stable. Postsurgical changes and pacing device are again seen and stable. Chronic scarring in the left base is noted. Mild increased density is noted in the right hemithorax consistent with small posterior effusion. Mild right basilar infiltrate is noted as well. No focal confluent infiltrate is seen. No bony abnormality is noted. IMPRESSION: Small right pleural effusion with mild basilar infiltrate Chronic changes in the right base. Electronically Signed   By: Inez Catalina M.D.   On: 09/22/2018 21:42    Labs:   Basic Metabolic Panel: Recent Labs  Lab 09/22/18 1933 09/23/18 0209 09/24/18 0229 09/25/18 0552 09/26/18 0945  NA 137 138 140 136 135  K 3.8 4.1 3.7 3.4* 3.7  CL 101 102 103 97* 100  CO2 16* 17* _0 GLUCOSE 119* 119* 122* 102* 125*  BUN 91* 91* 85* 80* 72*  CREATININE 3.77* 3.66* 3.32* 2.99* 3.24*  CALCIUM 9.3 9.0 8.7* 8.4* 8.4*  PHOS  --  4.0  --   --   --    GFR Estimated Creatinine Clearance: 16.1 mL/min (A) (by C-G formula based on SCr of 3.24 mg/dL (H)). Liver Function Tests: Recent Labs   Lab 09/22/18 1933 09/23/18 0209  AST 20  --   ALT 13  --   ALKPHOS 66  --   BILITOT 1.1  --   PROT 7.9  --   ALBUMIN 3.5 3.2*   No results for input(s): LIPASE, AMYLASE in the last 168 hours. No results for input(s): AMMONIA in the last 168 hours. Coagulation profile No results for input(s): INR, PROTIME in the last 168 hours.  CBC: Recent Labs  Lab 09/22/18 1857 09/24/18 0229 09/25/18 0552 09/26/18 0945  WBC 9.7 7.0 6.0 5.7  NEUTROABS  --  5.7 4.8 4.4  HGB 10.7* 9.9* 9.4* 8.8*  HCT 34.7* 31.3* 29.4* 27.6*  MCV 96.7 91.8 90.5 90.2  PLT 162 181 172 195   Cardiac Enzymes: Recent Labs  Lab 09/23/18 0209 09/23/18 0711 09/23/18 1708 09/23/18 1930 09/24/18 0229  TROPONINI 0.10* 0.12* 0.16* 0.16* 0.15*   BNP: Invalid input(s): POCBNP CBG: No results for input(s): GLUCAP in the last 168 hours. D-Dimer No results for input(s): DDIMER in the last 72 hours. Hgb A1c No results for input(s): HGBA1C in the last 72 hours. Lipid Profile No results  for input(s): CHOL, HDL, LDLCALC, TRIG, CHOLHDL, LDLDIRECT in the last 72 hours. Thyroid function studies No results for input(s): TSH, T4TOTAL, T3FREE, THYROIDAB in the last 72 hours.  Invalid input(s): FREET3 Anemia work up No results for input(s): VITAMINB12, FOLATE, FERRITIN, TIBC, IRON, RETICCTPCT in the last 72 hours. Microbiology Recent Results (from the past 240 hour(s))  Culture, blood (routine x 2)     Status: Abnormal   Collection Time: 09/22/18  7:28 PM  Result Value Ref Range Status   Specimen Description BLOOD RIGHT ANTECUBITAL  Final   Special Requests   Final    BOTTLES DRAWN AEROBIC AND ANAEROBIC Blood Culture adequate volume   Culture  Setup Time (A)  Final    GRAM VARIABLE ROD Organism ID to follow CRITICAL RESULT CALLED TO, READ BACK BY AND VERIFIED WITH: Hughie Closs [facility] 09/23/18 1741 JDW    Culture (A)  Final    LISTERIA MONOCYTOGENES Standardized susceptibility testing for this organism is not  available. HEALTH DEPARTMENT NOTIFIED Performed at [facility], [place] [address], [place], [place] [address]    Report Status 09/25/2018 FINAL  Final  Blood Culture ID Panel (Reflexed)     Status: Abnormal   Collection Time: 09/22/18  7:28 PM  Result Value Ref Range Status   Enterococcus species NOT DETECTED NOT DETECTED Final   Vancomycin resistance NOT DETECTED NOT DETECTED Final   Listeria monocytogenes DETECTED (A) NOT DETECTED Final    Comment: CRITICAL RESULT CALLED TO, READ BACK BY AND VERIFIED WITH: Hughie Closs PHARMD 09/23/18 1741 JDW    Staphylococcus species NOT DETECTED NOT DETECTED Final   Staphylococcus aureus (BCID) NOT DETECTED NOT DETECTED Final   Methicillin resistance NOT DETECTED NOT DETECTED Final   Streptococcus species NOT DETECTED NOT DETECTED Final   Streptococcus agalactiae NOT DETECTED NOT DETECTED Final   Streptococcus pneumoniae NOT DETECTED NOT DETECTED Final   Streptococcus pyogenes NOT DETECTED NOT DETECTED Final   Acinetobacter baumannii NOT DETECTED NOT DETECTED Final   Enterobacteriaceae species NOT DETECTED NOT DETECTED Final   Enterobacter cloacae complex NOT DETECTED NOT DETECTED Final   Escherichia coli NOT DETECTED NOT DETECTED Final   Klebsiella oxytoca NOT DETECTED NOT DETECTED Final   Klebsiella pneumoniae NOT DETECTED NOT DETECTED Final   Proteus species NOT DETECTED NOT DETECTED Final   Serratia marcescens NOT DETECTED NOT DETECTED Final   Carbapenem resistance NOT DETECTED NOT DETECTED Final   Haemophilus influenzae NOT DETECTED NOT DETECTED Final   Neisseria meningitidis NOT DETECTED NOT DETECTED Final   Pseudomonas aeruginosa NOT DETECTED NOT DETECTED Final   Candida albicans NOT DETECTED NOT DETECTED Final   Candida glabrata NOT DETECTED NOT DETECTED Final   Candida krusei NOT DETECTED NOT DETECTED Final   Candida parapsilosis NOT DETECTED NOT DETECTED Final   Candida tropicalis NOT DETECTED NOT DETECTED Final    Comment:  Performed at [facility], [place]. [address] [place], [place] [address]  Culture, blood (routine x 2)     Status: Abnormal   Collection Time: 09/22/18  7:47 PM  Result Value Ref Range Status   Specimen Description BLOOD LEFT ANTECUBITAL  Final   Special Requests   Final    BOTTLES DRAWN AEROBIC ONLY Blood Culture adequate volume   Culture  Setup Time   Final    AEROBIC BOTTLE ONLY GRAM VARIABLE ROD CRITICAL VALUE NOTED.  VALUE IS CONSISTENT WITH PREVIOUSLY REPORTED AND CALLED VALUE.    Culture (A)  Final    LISTERIA  MONOCYTOGENES Standardized susceptibility testing for this organism is not available. HEALTH DEPARTMENT NOTIFIED Performed at [facility], [place] [address], [place], [place] [address]    Report Status 09/25/2018 FINAL  Final  SARS Coronavirus 2 (CEPHEID - Performed in [place] hospital lab), Hosp Order     Status: None   Collection Time: 09/22/18  7:47 PM  Result Value Ref Range Status   SARS Coronavirus 2 NEGATIVE NEGATIVE Final    Comment: (NOTE) If result is NEGATIVE SARS-CoV-2 target nucleic acids are NOT DETECTED. The SARS-CoV-2 RNA is generally detectable in upper and lower  respiratory specimens during the acute phase of infection. The lowest  concentration of SARS-CoV-2 viral copies this assay can detect is 250  copies / mL. A negative result does not preclude SARS-CoV-2 infection  and should not be used as the sole basis for treatment or other  patient management decisions.  A negative result may occur with  improper specimen collection / handling, submission of specimen other  than nasopharyngeal swab, presence of viral mutation(s) within the  areas targeted by this assay, and inadequate number of viral copies  (<250 copies / mL). A negative result must be combined with clinical  observations, patient history, and epidemiological information. If result is POSITIVE SARS-CoV-2 target nucleic acids are DETECTED. The SARS-CoV-2 RNA is generally  detectable in upper and lower  respiratory specimens dur ing the acute phase of infection.  Positive  results are indicative of active infection with SARS-CoV-2.  Clinical  correlation with patient history and other diagnostic information is  necessary to determine patient infection status.  Positive results do  not rule out bacterial infection or co-infection with other viruses. If result is PRESUMPTIVE POSTIVE SARS-CoV-2 nucleic acids MAY BE PRESENT.   A presumptive positive result was obtained on the submitted specimen  and confirmed on repeat testing.  While 2019 novel coronavirus  (SARS-CoV-2) nucleic acids may be present in the submitted sample  additional confirmatory testing may be necessary for epidemiological  and / or clinical management purposes  to differentiate between  SARS-CoV-2 and other Sarbecovirus currently known to infect humans.  If clinically indicated additional testing with an alternate test  methodology [id number]) is advised. The SARS-CoV-2 RNA is generally  detectable in upper and lower respiratory sp ecimens during the acute  phase of infection. The expected result is Negative. Fact Sheet for Patients:  StrictlyIdeas.no Fact Sheet for Healthcare Providers: BankingDealers.co.za This test is not yet approved or cleared by the [place] FDA and has been authorized for detection and/or diagnosis of SARS-CoV-2 by FDA under an Emergency Use Authorization (EUA).  This EUA will remain in effect (meaning this test can be used) for the duration of the COVID-19 declaration under Section 564(b)(1) of the Act, 21 U.S.C. section 360bbb-3(b)(1), unless the authorization is terminated or revoked sooner. Performed at [facility], [place] [address] [place], [place] [address]   MRSA PCR Screening     Status: None   Collection Time: 09/23/18  2:31 AM  Result Value Ref Range Status   MRSA by PCR NEGATIVE NEGATIVE Final     Comment:        The GeneXpert MRSA Assay (FDA approved for NASAL specimens only), is one component of a comprehensive MRSA colonization surveillance program. It is not intended to diagnose MRSA infection nor to guide or monitor treatment for MRSA infections. Performed at [facility], [place] [address] [place], [place] [address]   Culture, blood (  routine x 2)     Status: None (Preliminary result)   Collection Time: 09/25/18  7:32 AM  Result Value Ref Range Status   Specimen Description BLOOD RIGHT HAND  Final   Special Requests   Final    BOTTLES DRAWN AEROBIC ONLY Blood Culture adequate volume   Culture   Final    NO GROWTH < 24 HOURS Performed at [facility], [place] [address], [place], [place] [address]    Report Status PENDING  Incomplete  Culture, blood (routine x 2)     Status: None (Preliminary result)   Collection Time: 09/25/18  7:32 AM  Result Value Ref Range Status   Specimen Description BLOOD RIGHT HAND  Final   Special Requests   Final    BOTTLES DRAWN AEROBIC ONLY Blood Culture adequate volume   Culture   Final    NO GROWTH < 24 HOURS Performed at [facility], [place] [address] [place], [place] [address]    Report Status PENDING  Incomplete     Discharge Instructions:   Discharge Instructions    Call MD for:  persistant nausea and vomiting   Complete by:  As directed    Call MD for:  severe uncontrolled pain   Complete by:  As directed    Call MD for:  temperature >100.4   Complete by:  As directed    Diet - low sodium heart healthy   Complete by:  As directed    Discharge instructions   Complete by:  As directed    Follow-up with your primary care physician within 1 week.  Follow-up with Dr. Linus Salmons infectious disease on 10/07/2018.  Continue antibiotics as per home health. Follow up with cardiology as outpatient (clinic to schedule).   Home infusion instructions [facility] May follow [facility] Dosing Protocol; May administer  Cathflo as needed to maintain patency of vascular access device.; Flushing of vascular access device: per [facility] Protocol: 0.9% NaCl pre/post medica...   Complete by:  As directed    Instructions:  May follow [facility] Dosing Protocol   Instructions:  May administer Cathflo as needed to maintain patency of vascular access device.   Instructions:  Flushing of vascular access device: per [facility] Protocol: 0.9% NaCl pre/post medication administration and prn patency; Heparin 100 u/ml, 23m for implanted ports and Heparin 10u/ml, 567mfor all other central venous catheters.   Instructions:  May follow [facility] Anaphylaxis Protocol for First Dose Administration in the home: 0.9% NaCl at 25-50 ml/hr to maintain IV access for protocol meds. Epinephrine 0.3 ml IV/IM PRN and Benadryl 25-50 IV/IM PRN s/s of anaphylaxis.   Instructions:  [facility] Coordinator (RN) to assist per patient IV care needs in the home PRN.   Increase activity slowly   Complete by:  As directed      Allergies as of 09/26/2018   No Known Allergies     Medication List    TAKE these medications   acetaminophen 325 MG tablet Commonly known as:  TYLENOL Take 2 tablets (650 mg total) by mouth every 6 (six) hours as needed for mild pain or fever (or Fever >/= 101).   amiodarone 200 MG tablet Commonly known as:  PACERONE TAKE 1/2 TABLET (100MG     TOTAL) DAILY What changed:  See the new instructions.   ampicillin  IVPB Inject 2 g into the vein every 8 (eight) hours for 17 days. As a continuous infusion Indication:  Listeria  Bacteremia  Last Day of Therapy:  10/13/2018 Labs -Twice weekly:  CBC/D and BMP, Labs - Every other week:  ESR and CRP   b complex vitamins tablet Take 1 tablet by mouth daily.   carvedilol 6.25 MG tablet Commonly known as:  COREG Take 1 tablet (6.25 mg total) by mouth 2 (two) times daily with a meal.   Co Q 10 100 MG Caps Take 100 mg by mouth 2 (two) times daily.   Eliquis 2.5 MG Tabs  tablet Generic drug:  apixaban TAKE 1 TABLET TWICE A DAY What changed:  how much to take   Magnesium 250 MG Tabs Take 250 mg by mouth daily.   NON FORMULARY Take 1 capsule by mouth See admin instructions. NAD+ (Nicotinamide Adenine Dinucleotide) capsules: Take 1 capsule by mouth once a day   potassium chloride SA 20 MEQ tablet Commonly known as:  K-DUR Take 1 tablet (20 mEq total) by mouth daily.   PreserVision AREDS 2 Caps Take 1 capsule by mouth 2 (two) times a day.   Resveratrol 100 MG Caps Take 100 mg by mouth daily.   sulfamethoxazole-trimethoprim 800-160 MG tablet Commonly known as:  BACTRIM DS Take 2 tablets by mouth 2 (two) times daily for 18 days.   torsemide 20 MG tablet Commonly known as:  DEMADEX Take 2 tablets (40 mg total) by mouth daily.   vitamin C 1000 MG tablet Take 1,000 mg by mouth daily.   zinc gluconate 50 MG tablet Take 50 mg by mouth daily.            Home Infusion Instuctions  (From admission, onward)         Start     Ordered   09/25/18 0000  Home infusion instructions [facility] May follow [facility] Dosing Protocol; May administer Cathflo as needed to maintain patency of vascular access device.; Flushing of vascular access device: per [facility] Protocol: 0.9% NaCl pre/post medica...    Question Answer Comment  Instructions May follow [facility] Dosing Protocol   Instructions May administer Cathflo as needed to maintain patency of vascular access device.   Instructions Flushing of vascular access device: per [facility] Protocol: 0.9% NaCl pre/post medication administration and prn patency; Heparin 100 u/ml, 24m for implanted ports and Heparin 10u/ml, 587mfor all other central venous catheters.   Instructions May follow [facility] Anaphylaxis Protocol for First Dose Administration in the home: 0.9% NaCl at 25-50 ml/hr to maintain IV access for protocol meds. Epinephrine 0.3 ml IV/IM PRN and Benadryl 25-50 IV/IM PRN s/s of anaphylaxis.    Instructions [facility] Infusion Coordinator (RN) to assist per patient IV care needs in the home PRN.      09/25/18 1709           Durable Medical Equipment  (From admission, onward)         Start     Ordered   09/26/18 1351  For home use only DME 4 wheeled rolling walker with seat  (Walkers)  Once    Question:  Patient needs a walker to treat with the following condition  Answer:  Debility   09/26/18 1353         Follow-up Information    Comer, RoOkey RegalMD. Go on 10/07/2018.   Specialty:  Infectious Diseases Why:  11:30 Am for infectious disease follow up Contact information: [phone]        McLarey DresserMD Follow up  on 10/08/2018.   Specialty:  Cardiology Why:  Tele Health visit with Dr Aundra Dubin at [place] information: [place] [address] [phone]          Time coordinating discharge: 39 minutes  Signed:  Yanet Balliet  [facility] 09/26/2018, 1:54 PM

## 2018-09-26 NOTE — Progress Notes (Signed)
Patient ID: Marcus [name], male   DOB: [birth year], 85 y.o.   MRN: [id number]    Advanced Heart Failure Rounding Note  PCP-Cardiologist: No primary care provider on file.   Subjective:    Patient alert/afebrile.  BMET not drawn yet. No complaints today, wants to go home.    COVID-19 negative. PCT 0.94. 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  No abdominal pain or diarrhea, has not had take out food recently.   Objective:   Weight Range: 80.7 kg Body mass index is 26.27 kg/m.   Vital Signs:   Temp:  [97.3 F (36.3 C)-97.9 F (36.6 C)] 97.3 F (36.3 C) (05/08 0819) Pulse Rate:  [71-78] 78 (05/08 0328) Resp:  [17-27] 23 (05/08 0819) BP: (99-113)/(58-64) 101/64 (05/08 0819) SpO2:  [97 %-100 %] 97 % (05/08 0328) Weight:  [80.7 kg] 80.7 kg (05/08 0328)    Weight change: Filed Weights   09/24/18 0300 09/25/18 0321 09/26/18 0328  Weight: 78.6 kg 81.2 kg 80.7 kg    Intake/Output:   Intake/Output Summary (Last 24 hours) at 09/26/2018 0912 Last data filed at 09/26/2018 0331 Gross per 24 hour  Intake 220 ml  Output 275 ml  Net -55 ml      Physical Exam    General: NAD Neck: No JVD, no thyromegaly or thyroid nodule.  Lungs: Clear to auscultation bilaterally with normal respiratory effort. CV: Nondisplaced PMI.  Heart regular S1/S2, no S3/S4, no murmur.  No peripheral edema.   Abdomen: Soft, nontender, no hepatosplenomegaly, no distention.  Skin: Intact without lesions or rashes.  Neurologic: Alert and oriented x 3.  Psych: Normal affect. Extremities: No clubbing or cyanosis.  HEENT: Normal.    Telemetry   A-V sequential pacing 70s (personally reviewed).   Labs    CBC Recent Labs    09/24/18 0229 09/25/18 0552  WBC 7.0 6.0  NEUTROABS 5.7 4.8  HGB 9.9* 9.4*  HCT 31.3* 29.4*  MCV 91.8 90.5  PLT 181 086   Basic Metabolic Panel Recent Labs    09/24/18 0229 09/25/18 0552  NA 140 136  K 3.7 3.4*  CL 103 97*   CO2 23 23  GLUCOSE 122* 102*  BUN 85* 80*  CREATININE 3.32* 2.99*  CALCIUM 8.7* 8.4*   Liver Function Tests No results for input(s): AST, ALT, ALKPHOS, BILITOT, PROT, ALBUMIN in the last 72 hours. No results for input(s): LIPASE, AMYLASE in the last 72 hours. Cardiac Enzymes Recent Labs    09/23/18 1708 09/23/18 1930 09/24/18 0229  TROPONINI 0.16* 0.16* 0.15*    BNP: BNP (last 3 results) Recent Labs    06/25/18 1209 09/22/18 1857  BNP 800.3* 1,259.5*    ProBNP (last 3 results) No results for input(s): PROBNP in the last 8760 hours.   D-Dimer No results for input(s): DDIMER in the last 72 hours. Hemoglobin A1C No results for input(s): HGBA1C in the last 72 hours. Fasting Lipid Panel No results for input(s): CHOL, HDL, LDLCALC, TRIG, CHOLHDL, LDLDIRECT in the last 72 hours. Thyroid Function Tests No results for input(s): TSH, T4TOTAL, T3FREE, THYROIDAB in the last 72 hours.  Invalid input(s): FREET3  Other results:   Imaging    Dg Chest Port 1v Same Day  Result Date: 09/25/2018 CLINICAL DATA:  PICC line placement. EXAM: PORTABLE CHEST 1 VIEW COMPARISON:  Chest x-ray dated Sep 23, 2018. FINDINGS: Interval placement of a right upper extremity PICC line with the tip in the distal SVC.  Unchanged left chest wall pacemaker. Stable cardiomegaly. Mildly improved interstitial pulmonary edema. Vague hazy opacity over the peripheral right upper lobe is unchanged. Suspected small loculated right pleural effusion is also unchanged. Probable trace left pleural effusion, unchanged. No consolidation or pneumothorax. Unchanged scattered calcified pleural plaques. No acute osseous abnormality. IMPRESSION: 1. Right upper extremity PICC line with tip in the distal SVC. 2. Mildly improved interstitial pulmonary edema. 3. Unchanged small loculated right pleural effusion with suspected fluid in the right major fissure. Electronically Signed   By: Titus Dubin M.D.   On: 09/25/2018 13:15    [place] Ekg Site Rite  Result Date: 09/25/2018 If Site Rite image not attached, placement could not be confirmed due to current cardiac rhythm.    Medications:     Scheduled Medications: . amiodarone  100 mg Oral Daily  . apixaban  2.5 mg Oral BID  . carvedilol  6.25 mg Oral BID WC  . potassium chloride  40 mEq Oral Daily  . sodium bicarbonate  650 mg Oral TID  . sodium chloride flush  10-40 mL Intracatheter Q12H  . sulfamethoxazole-trimethoprim  2 tablet Oral Q12H  . torsemide  40 mg Oral Daily    Infusions: . ampicillin (OMNIPEN) IV 2 g (09/26/18 0333)    PRN Medications: acetaminophen **OR** acetaminophen, sodium chloride flush    Assessment/Plan   1. 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  He has been started on ampicillin + Bactrim. He denies abdominal discomfort/diarrhea, has not been eating out.  He was confused/disoriented at admission but clear today without localizing CNS symptoms/signs.  - Continue ampicillin + Bactrim, needs 3 wks treatment per ID.  Need to be careful with Bactrim dosing given renal dysfunction.  Needs BMET today and would repeat next week as outpatient.  - Per ID, think we can hold off on further CNS imaging as delirium has cleared up and no worrisome signs/symptoms of meningoencephalitis.  He will be treated with 3 wks abx regardless.  2. Chronic systolic ZOX:WRUEAVWU cardiomyopathy. Echo in 1/19 showed EF 35-40% (stable from most recent prior). Has [employer] Jude CRT-P device. On device check on 08/06/18, he was appropriately BiV pacing with extremely rare AF, thoracic impedance was low.  I recently increased his torsemide to 40 mg daily for volume overload.  Per his wife, he has been losing weigh. Torsemide restarted, volume looks ok on exam today.   - Continue torsemide 40 mg daily. Need BMET today.  - can increase Coreg to his home dose 6.25 mg bid.  - Off spiro with increased creatinine and potassium. - Not on ACEI/ARB/ARNI with CKD. 3. Atrial  fibrillation: Paroxysmal. <1% atrial fibrillation on recent device check.  - Continue amiodarone 100 mg daily. - He is on Eliquis 2.5 mg bid (age, elevated creatinine).  4. CAD:H/o CABG.  No chest pain.Mild troponin elevation, no trend.  - Suspect demand ischemia in setting of hypotension/infection and CKD stage IV.  -Refuses statin. - He is not on ASA given use of Eliquis and stable CAD.  5. Sick sinus syndrome: s/p CRT-P.No change. 6. AKI on CKD, stage JW:[id number] back to baseline.  - Needs BMET today.  - Follow BMET closely with Bactrim, would draw next week.  7. Abnormal CXR: Mass near right major fissure, ?loculated pleural fluid but recommend CT.  CT showed no mass, it is loculated pleural fluid.  Stable for home from my standpoint, cardiology will sign off.  Needs BMET next week and will arrange telehealth followup for a couple  weeks.   Length of Stay: 3  [name], MD  09/26/2018, 9:12 AM  Advanced Heart Failure Team Pager [phone] (M-F; 7a - 4p)  Please contact [facility] for night-coverage after hours (4p -7a ) and weekends on [email or web address]

## 2018-09-26 NOTE — TOC Transition Note (Signed)
Transition of Care [facility]) - CM/SW Discharge Note   Patient Details  Name: ALEKSEY NEWBERN MRN: [id number] Date of Birth: [birth year]  [facility] CM/SW Contact:  Maryclare Labrador, RN Phone Number: 09/26/2018, 3:23 PM   Clinical Narrative:   Pt to discharge home today.  [place]. [employer] and [employer] aware of discharge home today.      Final next level of care: [place] Barriers to Discharge: Continued Medical Work up   Patient Goals and CMS Choice Patient states their goals for this hospitalization and ongoing recovery are:: Return home CMS Medicare.gov Compare Post Acute Care list provided to:: Patient(and spouse) Choice offered to / list presented to : Patient, Spouse  Discharge Placement                       Discharge Plan and Services In-house Referral: Clinical Social Work Discharge Planning Services: NA Post Acute Care Choice: Home Health, Resumption of Svcs/PTA Provider          DME Arranged: IV pump/equipment, rollator with [employer] DME Agency: [place]) Date DME Agency Contacted: 09/25/18 Time DME Agency Contacted: 1220 Representative spoke with at DME Agency: Referral given by attending group to [place] with [employer] HH Arranged: Nurse's Aide, OT [place] Agency: [place] ([place]) Date HH Agency Contacted: 09/25/18 Time [place]: [phone] Representative spoke with at [place]: [place] ([place]) Interventions     Readmission Risk Interventions Readmission Risk Prevention Plan 09/25/2018  Transportation Screening Complete  PCP or Specialist Appt within 3-5 Days Complete  HRI or [place] Complete  Social Work Consult for [place] Planning/Counseling Complete  Palliative Care Screening Not Applicable  Medication Review Press photographer) Complete  Some recent data might be hidden

## 2018-09-27 ENCOUNTER — Telehealth: Payer: Self-pay | Admitting: Cardiology

## 2018-09-27 NOTE — Telephone Encounter (Signed)
Pt's daughter called to discuss pt being very nauseated and having no appetite. Pt was discharged yesterday on antibiotics for bacteremia. She feels that he is having reaction to antibiotics. She tried to call the infectious disease MD but no answering service available. The patient has no PCP. I paged inpatient ID, Dr Baxter Flattery for advise. No answer yet, I will give one more page.

## 2018-09-27 NOTE — Telephone Encounter (Signed)
Dr. Baxter Flattery with ID contacted regarding pt nausea related to antibiotics. She will call pt at home with recommendations.

## 2018-09-28 ENCOUNTER — Emergency Department (HOSPITAL_COMMUNITY): Payer: Medicare Other

## 2018-09-28 ENCOUNTER — Inpatient Hospital Stay (HOSPITAL_COMMUNITY)
Admission: EM | Admit: 2018-09-28 | Discharge: 2018-10-01 | DRG: 291 | Disposition: A | Discharge status: Hospice | Payer: Medicare Other | Attending: Internal Medicine | Admitting: Internal Medicine

## 2018-09-28 ENCOUNTER — Other Ambulatory Visit: Payer: Self-pay

## 2018-09-28 ENCOUNTER — Inpatient Hospital Stay (HOSPITAL_COMMUNITY): Payer: Medicare Other

## 2018-09-28 DIAGNOSIS — Z792 Long term (current) use of antibiotics: Secondary | ICD-10-CM

## 2018-09-28 DIAGNOSIS — Z20828 Contact with and (suspected) exposure to other viral communicable diseases: Secondary | ICD-10-CM | POA: Diagnosis present

## 2018-09-28 DIAGNOSIS — R7881 Bacteremia: Secondary | ICD-10-CM | POA: Diagnosis not present

## 2018-09-28 DIAGNOSIS — I739 Peripheral vascular disease, unspecified: Secondary | ICD-10-CM | POA: Diagnosis present

## 2018-09-28 DIAGNOSIS — D72829 Elevated white blood cell count, unspecified: Secondary | ICD-10-CM | POA: Diagnosis not present

## 2018-09-28 DIAGNOSIS — Z515 Encounter for palliative care: Secondary | ICD-10-CM

## 2018-09-28 DIAGNOSIS — E785 Hyperlipidemia, unspecified: Secondary | ICD-10-CM | POA: Diagnosis present

## 2018-09-28 DIAGNOSIS — N185 Chronic kidney disease, stage 5: Secondary | ICD-10-CM | POA: Diagnosis not present

## 2018-09-28 DIAGNOSIS — R0902 Hypoxemia: Secondary | ICD-10-CM | POA: Diagnosis present

## 2018-09-28 DIAGNOSIS — I495 Sick sinus syndrome: Secondary | ICD-10-CM | POA: Diagnosis present

## 2018-09-28 DIAGNOSIS — I480 Paroxysmal atrial fibrillation: Secondary | ICD-10-CM | POA: Diagnosis present

## 2018-09-28 DIAGNOSIS — N184 Chronic kidney disease, stage 4 (severe): Secondary | ICD-10-CM | POA: Diagnosis present

## 2018-09-28 DIAGNOSIS — Z8249 Family history of ischemic heart disease and other diseases of the circulatory system: Secondary | ICD-10-CM

## 2018-09-28 DIAGNOSIS — Z87891 Personal history of nicotine dependence: Secondary | ICD-10-CM

## 2018-09-28 DIAGNOSIS — I5043 Acute on chronic combined systolic (congestive) and diastolic (congestive) heart failure: Secondary | ICD-10-CM | POA: Diagnosis not present

## 2018-09-28 DIAGNOSIS — G9341 Metabolic encephalopathy: Secondary | ICD-10-CM | POA: Diagnosis present

## 2018-09-28 DIAGNOSIS — I361 Nonrheumatic tricuspid (valve) insufficiency: Secondary | ICD-10-CM

## 2018-09-28 DIAGNOSIS — Z951 Presence of aortocoronary bypass graft: Secondary | ICD-10-CM

## 2018-09-28 DIAGNOSIS — Z955 Presence of coronary angioplasty implant and graft: Secondary | ICD-10-CM

## 2018-09-28 DIAGNOSIS — I255 Ischemic cardiomyopathy: Secondary | ICD-10-CM | POA: Diagnosis present

## 2018-09-28 DIAGNOSIS — J9601 Acute respiratory failure with hypoxia: Secondary | ICD-10-CM | POA: Diagnosis present

## 2018-09-28 DIAGNOSIS — N19 Unspecified kidney failure: Secondary | ICD-10-CM | POA: Diagnosis not present

## 2018-09-28 DIAGNOSIS — E872 Acidosis, unspecified: Secondary | ICD-10-CM

## 2018-09-28 DIAGNOSIS — Z950 Presence of cardiac pacemaker: Secondary | ICD-10-CM

## 2018-09-28 DIAGNOSIS — Z7901 Long term (current) use of anticoagulants: Secondary | ICD-10-CM

## 2018-09-28 DIAGNOSIS — D631 Anemia in chronic kidney disease: Secondary | ICD-10-CM | POA: Diagnosis present

## 2018-09-28 DIAGNOSIS — E871 Hypo-osmolality and hyponatremia: Secondary | ICD-10-CM | POA: Diagnosis present

## 2018-09-28 DIAGNOSIS — I2510 Atherosclerotic heart disease of native coronary artery without angina pectoris: Secondary | ICD-10-CM | POA: Diagnosis present

## 2018-09-28 DIAGNOSIS — Z7189 Other specified counseling: Secondary | ICD-10-CM

## 2018-09-28 DIAGNOSIS — E875 Hyperkalemia: Secondary | ICD-10-CM

## 2018-09-28 DIAGNOSIS — E860 Dehydration: Secondary | ICD-10-CM | POA: Diagnosis present

## 2018-09-28 DIAGNOSIS — R531 Weakness: Secondary | ICD-10-CM | POA: Diagnosis not present

## 2018-09-28 DIAGNOSIS — T368X5A Adverse effect of other systemic antibiotics, initial encounter: Secondary | ICD-10-CM | POA: Diagnosis present

## 2018-09-28 DIAGNOSIS — Z800 Family history of malignant neoplasm of digestive organs: Secondary | ICD-10-CM

## 2018-09-28 DIAGNOSIS — I5023 Acute on chronic systolic (congestive) heart failure: Secondary | ICD-10-CM | POA: Diagnosis present

## 2018-09-28 DIAGNOSIS — J810 Acute pulmonary edema: Secondary | ICD-10-CM

## 2018-09-28 DIAGNOSIS — I130 Hypertensive heart and chronic kidney disease with heart failure and stage 1 through stage 4 chronic kidney disease, or unspecified chronic kidney disease: Secondary | ICD-10-CM | POA: Diagnosis present

## 2018-09-28 DIAGNOSIS — I340 Nonrheumatic mitral (valve) insufficiency: Secondary | ICD-10-CM

## 2018-09-28 DIAGNOSIS — Z66 Do not resuscitate: Secondary | ICD-10-CM | POA: Diagnosis not present

## 2018-09-28 DIAGNOSIS — N179 Acute kidney failure, unspecified: Secondary | ICD-10-CM | POA: Diagnosis present

## 2018-09-28 DIAGNOSIS — N289 Disorder of kidney and ureter, unspecified: Secondary | ICD-10-CM | POA: Diagnosis not present

## 2018-09-28 DIAGNOSIS — I272 Pulmonary hypertension, unspecified: Secondary | ICD-10-CM | POA: Diagnosis present

## 2018-09-28 DIAGNOSIS — R0603 Acute respiratory distress: Secondary | ICD-10-CM | POA: Diagnosis not present

## 2018-09-28 DIAGNOSIS — Z86718 Personal history of other venous thrombosis and embolism: Secondary | ICD-10-CM

## 2018-09-28 DIAGNOSIS — R570 Cardiogenic shock: Secondary | ICD-10-CM | POA: Diagnosis not present

## 2018-09-28 DIAGNOSIS — A329 Listeriosis, unspecified: Secondary | ICD-10-CM | POA: Diagnosis present

## 2018-09-28 DIAGNOSIS — N189 Chronic kidney disease, unspecified: Secondary | ICD-10-CM | POA: Diagnosis not present

## 2018-09-28 DIAGNOSIS — Z8546 Personal history of malignant neoplasm of prostate: Secondary | ICD-10-CM

## 2018-09-28 LAB — BASIC METABOLIC PANEL
Anion gap: 22 — ABNORMAL HIGH (ref 5–15)
Anion gap: 17 — ABNORMAL HIGH (ref 5–15)
Anion gap: 16 — ABNORMAL HIGH (ref 5–15)
Anion gap: 16 — ABNORMAL HIGH (ref 5–15)
BUN: 83 mg/dL — ABNORMAL HIGH (ref 8–23)
BUN: 86 mg/dL — ABNORMAL HIGH (ref 8–23)
BUN: 91 mg/dL — ABNORMAL HIGH (ref 8–23)
BUN: 91 mg/dL — ABNORMAL HIGH (ref 8–23)
CO2: 16 mmol/L — ABNORMAL LOW (ref 22–32)
CO2: 19 mmol/L — ABNORMAL LOW (ref 22–32)
CO2: 20 mmol/L — ABNORMAL LOW (ref 22–32)
CO2: 19 mmol/L — ABNORMAL LOW (ref 22–32)
Calcium: 9 mg/dL (ref 8.9–10.3)
Calcium: 9 mg/dL (ref 8.9–10.3)
Calcium: 9 mg/dL (ref 8.9–10.3)
Calcium: 8.9 mg/dL (ref 8.9–10.3)
Chloride: 95 mmol/L — ABNORMAL LOW (ref 98–111)
Chloride: 98 mmol/L (ref 98–111)
Chloride: 98 mmol/L (ref 98–111)
Chloride: 99 mmol/L (ref 98–111)
Creatinine, Ser: 4.2 mg/dL — ABNORMAL HIGH (ref 0.61–1.24)
Creatinine, Ser: 4.24 mg/dL — ABNORMAL HIGH (ref 0.61–1.24)
Creatinine, Ser: 4.27 mg/dL — ABNORMAL HIGH (ref 0.61–1.24)
Creatinine, Ser: 4.46 mg/dL — ABNORMAL HIGH (ref 0.61–1.24)
GFR calc Af Amer: 14 mL/min — ABNORMAL LOW (ref >60)
GFR calc Af Amer: 14 mL/min — ABNORMAL LOW (ref >60)
GFR calc Af Amer: 14 mL/min — ABNORMAL LOW (ref >60)
GFR calc Af Amer: 13 mL/min — ABNORMAL LOW (ref >60)
GFR calc non Af Amer: 12 mL/min — ABNORMAL LOW (ref >60)
GFR calc non Af Amer: 12 mL/min — ABNORMAL LOW (ref >60)
GFR calc non Af Amer: 12 mL/min — ABNORMAL LOW (ref >60)
GFR calc non Af Amer: 11 mL/min — ABNORMAL LOW (ref >60)
Glucose, Bld: 130 mg/dL — ABNORMAL HIGH (ref 70–99)
Glucose, Bld: 122 mg/dL — ABNORMAL HIGH (ref 70–99)
Glucose, Bld: 114 mg/dL — ABNORMAL HIGH (ref 70–99)
Glucose, Bld: 136 mg/dL — ABNORMAL HIGH (ref 70–99)
Potassium: 6.2 mmol/L — ABNORMAL HIGH (ref 3.5–5.1)
Potassium: 6.2 mmol/L — ABNORMAL HIGH (ref 3.5–5.1)
Potassium: 6.7 mmol/L (ref 3.5–5.1)
Potassium: 5.5 mmol/L — ABNORMAL HIGH (ref 3.5–5.1)
Sodium: 133 mmol/L — ABNORMAL LOW (ref 135–145)
Sodium: 134 mmol/L — ABNORMAL LOW (ref 135–145)
Sodium: 134 mmol/L — ABNORMAL LOW (ref 135–145)
Sodium: 134 mmol/L — ABNORMAL LOW (ref 135–145)

## 2018-09-28 LAB — CBC
HCT: 31.6 % — ABNORMAL LOW (ref 39.0–52.0)
HCT: 28.5 % — ABNORMAL LOW (ref 39.0–52.0)
Hemoglobin: 9.6 g/dL — ABNORMAL LOW (ref 13.0–17.0)
Hemoglobin: 9 g/dL — ABNORMAL LOW (ref 13.0–17.0)
MCH: 29 pg (ref 26.0–34.0)
MCH: 28.9 pg (ref 26.0–34.0)
MCHC: 30.4 g/dL (ref 30.0–36.0)
MCHC: 31.6 g/dL (ref 30.0–36.0)
MCV: 95.5 fL (ref 80.0–100.0)
MCV: 91.6 fL (ref 80.0–100.0)
Platelets: 260 10*3/uL (ref 150–400)
Platelets: 244 10*3/uL (ref 150–400)
RBC: 3.31 MIL/uL — ABNORMAL LOW (ref 4.22–5.81)
RBC: 3.11 MIL/uL — ABNORMAL LOW (ref 4.22–5.81)
RDW: 15.7 % — ABNORMAL HIGH (ref 11.5–15.5)
RDW: 15.7 % — ABNORMAL HIGH (ref 11.5–15.5)
WBC: 7.6 10*3/uL (ref 4.0–10.5)
WBC: 11.6 10*3/uL — ABNORMAL HIGH (ref 4.0–10.5)
nRBC: 0 % (ref 0.0–0.2)
nRBC: 0 % (ref 0.0–0.2)

## 2018-09-28 LAB — GLUCOSE, CAPILLARY
Glucose-Capillary: 117 mg/dL — ABNORMAL HIGH (ref 70–99)
Glucose-Capillary: 212 mg/dL — ABNORMAL HIGH (ref 70–99)
Glucose-Capillary: 123 mg/dL — ABNORMAL HIGH (ref 70–99)

## 2018-09-28 LAB — BRAIN NATRIURETIC PEPTIDE: B Natriuretic Peptide: 1315 pg/mL — ABNORMAL HIGH (ref 0.0–100.0)

## 2018-09-28 LAB — FERRITIN: Ferritin: 1089 ng/mL — ABNORMAL HIGH (ref 24–336)

## 2018-09-28 LAB — ECHOCARDIOGRAM COMPLETE
Height: 69 in
Weight: 2973.56 oz

## 2018-09-28 LAB — IRON AND TIBC
Iron: 43 ug/dL — ABNORMAL LOW (ref 45–182)
Saturation Ratios: 15 % — ABNORMAL LOW (ref 17.9–39.5)
TIBC: 294 ug/dL (ref 250–450)
UIBC: 251 ug/dL

## 2018-09-28 LAB — NA AND K (SODIUM & POTASSIUM), RAND UR
Potassium Urine: 33 mmol/L
Sodium, Ur: 43 mmol/L

## 2018-09-28 LAB — TSH: TSH: 1.155 u[IU]/mL (ref 0.350–4.500)

## 2018-09-28 LAB — SARS CORONAVIRUS 2 BY RT PCR (HOSPITAL ORDER, PERFORMED IN ~~LOC~~ HOSPITAL LAB): SARS Coronavirus 2: NEGATIVE

## 2018-09-28 MED ORDER — SODIUM CHLORIDE 0.9 % IV SOLN
2.0000 g | Freq: Three times a day (TID) | INTRAVENOUS | Status: DC
  Administered 2018-09-28 – 2018-10-01 (×10): 2 g via INTRAVENOUS
  Filled 2018-09-28 (×14): qty 2000

## 2018-09-28 MED ORDER — SODIUM POLYSTYRENE SULFONATE 15 GM/60ML PO SUSP
15.0000 g | Freq: Once | ORAL | Status: DC
  Filled 2018-09-28: qty 60

## 2018-09-28 MED ORDER — PROSIGHT PO TABS
1.0000 | ORAL_TABLET | Freq: Two times a day (BID) | ORAL | Status: DC
  Administered 2018-09-28 – 2018-10-01 (×7): 1 via ORAL
  Filled 2018-09-28 (×7): qty 1

## 2018-09-28 MED ORDER — AMPICILLIN IV (FOR PTA / DISCHARGE USE ONLY): 2.0000 g | Freq: Three times a day (TID) | INTRAVENOUS | Status: DC

## 2018-09-28 MED ORDER — CARVEDILOL 6.25 MG PO TABS
6.2500 mg | ORAL_TABLET | Freq: Two times a day (BID) | ORAL | Status: DC
  Administered 2018-09-28 (×2): 6.25 mg via ORAL
  Filled 2018-09-28 (×2): qty 1

## 2018-09-28 MED ORDER — FUROSEMIDE 10 MG/ML IJ SOLN
80.0000 mg | Freq: Once | INTRAMUSCULAR | Status: AC
  Administered 2018-09-28: 80 mg via INTRAVENOUS
  Filled 2018-09-28: qty 8

## 2018-09-28 MED ORDER — FUROSEMIDE 10 MG/ML IJ SOLN
80.0000 mg | Freq: Two times a day (BID) | INTRAMUSCULAR | Status: DC
  Administered 2018-09-28: 80 mg via INTRAVENOUS
  Filled 2018-09-28: qty 8

## 2018-09-28 MED ORDER — MAGNESIUM OXIDE 400 (241.3 MG) MG PO TABS
400.0000 mg | ORAL_TABLET | Freq: Every day | ORAL | Status: DC
  Administered 2018-09-28 – 2018-09-29 (×2): 400 mg via ORAL
  Filled 2018-09-28 (×2): qty 1

## 2018-09-28 MED ORDER — SODIUM ZIRCONIUM CYCLOSILICATE 10 G PO PACK
10.0000 g | PACK | Freq: Once | ORAL | Status: AC
  Administered 2018-09-28: 10 g via ORAL
  Filled 2018-09-28: qty 1

## 2018-09-28 MED ORDER — B COMPLEX-C PO TABS
1.0000 | ORAL_TABLET | Freq: Every day | ORAL | Status: DC
  Administered 2018-09-28 – 2018-09-30 (×3): 1 via ORAL
  Filled 2018-09-28 (×4): qty 1

## 2018-09-28 MED ORDER — FUROSEMIDE 10 MG/ML IJ SOLN
80.0000 mg | Freq: Two times a day (BID) | INTRAMUSCULAR | Status: AC
  Administered 2018-09-28 (×2): 80 mg via INTRAVENOUS
  Filled 2018-09-28: qty 8

## 2018-09-28 MED ORDER — APIXABAN 2.5 MG PO TABS
2.5000 mg | ORAL_TABLET | Freq: Two times a day (BID) | ORAL | Status: DC
  Administered 2018-09-28 – 2018-09-29 (×4): 2.5 mg via ORAL
  Filled 2018-09-28 (×4): qty 1

## 2018-09-28 MED ORDER — ALBUTEROL SULFATE (2.5 MG/3ML) 0.083% IN NEBU: 2.5000 mg | INHALATION_SOLUTION | RESPIRATORY_TRACT | Status: DC | PRN

## 2018-09-28 MED ORDER — VITAMIN C 500 MG PO TABS
1000.0000 mg | ORAL_TABLET | Freq: Every day | ORAL | Status: DC
  Administered 2018-09-28 – 2018-10-01 (×4): 1000 mg via ORAL
  Filled 2018-09-28 (×4): qty 2

## 2018-09-28 MED ORDER — SODIUM CHLORIDE 0.9 % IV SOLN
INTRAVENOUS | Status: DC | PRN
  Administered 2018-09-28 – 2018-09-29 (×2): 250 mL via INTRAVENOUS

## 2018-09-28 MED ORDER — SODIUM CHLORIDE 0.9% FLUSH
3.0000 mL | Freq: Two times a day (BID) | INTRAVENOUS | Status: DC
  Administered 2018-09-28 – 2018-09-30 (×6): 3 mL via INTRAVENOUS

## 2018-09-28 MED ORDER — SODIUM ZIRCONIUM CYCLOSILICATE 10 G PO PACK
10.0000 g | PACK | Freq: Four times a day (QID) | ORAL | Status: AC
  Administered 2018-09-28 – 2018-09-29 (×3): 10 g via ORAL
  Filled 2018-09-28 (×3): qty 1

## 2018-09-28 MED ORDER — DEXTROSE 50 % IV SOLN
1.0000 | Freq: Once | INTRAVENOUS | Status: AC
  Administered 2018-09-28: 50 mL via INTRAVENOUS
  Filled 2018-09-28: qty 50

## 2018-09-28 MED ORDER — INSULIN ASPART 100 UNIT/ML IV SOLN
5.0000 [IU] | Freq: Once | INTRAVENOUS | Status: AC
  Administered 2018-09-28: 5 [IU] via INTRAVENOUS

## 2018-09-28 MED ORDER — AMIODARONE HCL 100 MG PO TABS
100.0000 mg | ORAL_TABLET | Freq: Every day | ORAL | Status: DC
  Administered 2018-09-28 – 2018-10-01 (×4): 100 mg via ORAL
  Filled 2018-09-28 (×4): qty 1

## 2018-09-28 NOTE — Progress Notes (Signed)
CRITICAL VALUE ALERT  Critical Value:  6.7 Potassium  Date & Time Notied: 09/28/2018 , 1543  Provider Notified: Dr. Algis Liming  Orders Received/Actions taken: will wait for orders. Pt resting in bed.

## 2018-09-28 NOTE — Progress Notes (Signed)
Patient bladder scanned for 13 mls of urine. No discomfort described by patient and no distention noted.  Urine sample forwarded to lab per MD order.  Lokelma dose administered per order and pt tolerated well.  IV dextrose  And insulin administered per MD order. Prior to D50 blood sugar was 117. Thirty minutes after D50 blood sugar 212. Insulin administered as orderred  Patient awake, alert, 02 at 4/L per [place] and 02 sats ranging from 90-96. Patient is mouth breathing. Skin pink, skin warm. Small amount of edema noted to left arm and hand.   Will continue to monitor. Labs scheduled to be drawn again at 2200.

## 2018-09-28 NOTE — Progress Notes (Signed)
Addendum  Reviewed 3 PM BMP.  Potassium 6.7.  Creatinine 4.27.  Patient only received Lokelma at around 12:30 PM and hence probably inadequate time to act.  Discussed with Nephrologist on call and manage as per recommendations below:  Lokelma 10 g every 6 hourly x3 doses including a dose now. Insulin and dextrose No IV calcium (BMP shows calcium of 9). Repeat BMP at 10 PM.  Vernell Leep, MD, FACP, [facility]. [facility]  To contact the attending provider between 7A-7P or the covering provider during after hours 7P-7A, please log into the web site [email or web address] and access using universal [place] password for that web site. If you do not have the password, please call the hospital operator.

## 2018-09-28 NOTE — Consult Note (Signed)
[facility]  Total days of antibiotics 6               Reason for Consult:aki and intolerance to abtx    Referring Physician: [email or web address]  Active Problems:   Acute on chronic systolic CHF (congestive heart failure) (HCC)   Hypoxia   Hyperkalemia   Acute respiratory failure with hypoxia (HCC)    HPI: AKAI DOLLARD is a 85 y.o. male with hx of CAD, chronic systolic CHF, CKD 3, recentlly admitted on 5/4 for sepsis due to listeria bacteremia. He was recommended to discharge on bactrim plus ampicillin and discharged home on 5/8. He became increasingly weak, nauseated with difficulty keeping liquids in the 24hr that he was at home. He was readmitted on the evening of 5/9 for worsening shortness of breath, hypoxia of 85%. Found to have CHF exacerbation in the setting of acute on chronic kidney disease likely from bactrim and poor po intake. Labs significant for hyponatremia of Na 133, BUN 83, and CR of 4.2, BNP of 1315. And WBC also elevated at 11K. ID asked to weigh in on abtx management  Past Medical History:  Diagnosis Date  . Atrial flutter ([place])    a. Dx 07/2014, s/p TEE/DCCV.  . Cataracts, bilateral   . Chronic systolic CHF (congestive heart failure) ([place])    a. s/p CRT-D 09/2013. b. Device explant with temp perm then subsequent CRT-P 07/2014.  Marland Kitchen CKD (chronic kidney disease), stage III ([place])   . Coronary artery disease    a. s/p remote CABG x 2(VG->OM, LIMA->LAD;  b. Late 90's s/p PCI of RCA;  c. 12/09 Cath/PCI: LM 33m, 70-80d (3.0x82mm Xience DES), LAD100p, LCX 80-90p (2.25x62mmTaxus Atom DES), RCA 100d (2.5x75mm Xience DES), VG->OM 100, LIMA->LAD nl, EF 30-35%.  Marland Kitchen DVT (deep venous thrombosis) ([place]) 2017   Right knee  . Hard of hearing   . History of colon polyps   . History of gastric ulcer   . History of gout   . History of shingles   . Hyperlipidemia   . Hypertension   . Ischemic cardiomyopathy    a. EF 35-40% by echo 05/2012, b. EF 20-25%, akinesis  and scarring of inferolateral, inferior and inferoseptal myocardium, mild AI, mod MR, LA mod dilated, RV mildly dialted and sys fx mildly reduced, RA mildly dilated (09/2013)  c. EF 35-40% by 2D in 07/2014, 25-30% by TEE.  . Macular degeneration, dry   . Pacemaker infection ([place])   . PAF (paroxysmal atrial fibrillation) ([place])   . Peripheral vascular disease ([place])   . Presence of permanent cardiac pacemaker 07/23/2014   [facility] [id number], Serial [id number]  . Prostate cancer ([place])   . Symptomatic bradycardia    a. 08/2003 s/p MDT Enpulse [id number] Dual chamber PPM ser # [id number] H. b. Device explant due to infection/temp perm insertion/PPM 07/2014.    Allergies: No Known Allergies   MEDICATIONS: . amiodarone  100 mg Oral Daily  . apixaban  2.5 mg Oral BID  . B-complex with vitamin C  1 tablet Oral Daily  . carvedilol  6.25 mg Oral BID WC  . furosemide  80 mg Intravenous BID  . magnesium oxide  400 mg Oral Daily  . multivitamin  1 tablet Oral BID  . sodium chloride flush  3 mL Intravenous Q12H  . vitamin C  1,000 mg Oral Daily    Social History   Tobacco Use  . Smoking status: Former Smoker  Last attempt to quit: 02/10/1971    Years since quitting: 47.6  . Smokeless tobacco: Never Used  . Tobacco comment: quit smoking 19yrs ago  Substance Use Topics  . Alcohol use: No    Comment: nothing in 36yrs   . Drug use: No    Family History  Problem Relation Age of Onset  . Hypertension Mother   . Colon cancer Mother   . Cancer Mother        Colon  . Alcohol abuse Father    Review of Systems  Constitutional: + weakness fatigue. Negative for fever, chills, diaphoresis, activity change, appetite change, fatigue and unexpected weight change.  HENT: Negative for congestion, sore throat, rhinorrhea, sneezing, trouble swallowing and sinus pressure.  Eyes: Negative for photophobia and visual disturbance.  Respiratory: +shortness of breath. Negative for cough, chest tightness,  wheezing and stridor.  Cardiovascular: Negative for chest pain, palpitations and leg swelling.  Gastrointestinal: positive for nausea, but negative for vomiting, abdominal pain, diarrhea, constipation, blood in stool, abdominal distention and anal bleeding.  Genitourinary: Negative for dysuria, hematuria, flank pain and difficulty urinating.  Musculoskeletal: Negative for myalgias, back pain, joint swelling, arthralgias and gait problem.  Skin: Negative for color change, pallor, rash and wound.  Neurological: Negative for dizziness, tremors, weakness and light-headedness.  Hematological: Negative for adenopathy. Does not bruise/bleed easily.  Psychiatric/Behavioral: Negative for behavioral problems, confusion, sleep disturbance, dysphoric mood, decreased concentration and agitation.     OBJECTIVE: Temp:  [97.4 F (36.3 C)-98.3 F (36.8 C)] 97.4 F (36.3 C) (05/10 1126) Pulse Rate:  [47-85] 72 (05/10 1126) Resp:  [18-32] 21 (05/10 1126) BP: (95-112)/(65-81) 100/65 (05/10 1126) SpO2:  [86 %-100 %] 95 % (05/10 1126) Weight:  [84.3 kg] 84.3 kg (05/10 0509) Physical Exam  Constitutional: He is oriented to person, place, and appears frail and under-nourished. No distress.  HENT:  Mouth/Throat: Oropharynx is clear and moist. No oropharyngeal exudate.  Cardiovascular: Normal rate, regular rhythm and normal heart sounds. Exam reveals no gallop and no friction rub.  No murmur heard.  Pulmonary/Chest: Effort normal and breath sounds normal. No respiratory distress. He has no wheezes.  Abdominal: Soft. Bowel sounds are normal. He exhibits no distension. There is no tenderness.  Lymphadenopathy:  He has no cervical adenopathy.  Neurological: He is alert and oriented to person, place, and time.  Skin: Skin is warm and dry. No rash noted. No erythema.  Psychiatric: He has a normal mood and affect. His behavior is normal.     LABS: Results for orders placed or performed during the hospital  encounter of 09/28/18 (from the past 48 hour(s))  Basic metabolic panel     Status: Abnormal   Collection Time: 09/28/18  2:10 AM  Result Value Ref Range   Sodium 133 (L) 135 - 145 mmol/L   Potassium 6.2 (H) 3.5 - 5.1 mmol/L    Comment: NO VISIBLE HEMOLYSIS   Chloride 95 (L) 98 - 111 mmol/L   CO2 16 (L) 22 - 32 mmol/L   Glucose, Bld 130 (H) 70 - 99 mg/dL   BUN 83 (H) 8 - 23 mg/dL   Creatinine, Ser 4.20 (H) 0.61 - 1.24 mg/dL   Calcium 9.0 8.9 - 10.3 mg/dL   GFR calc non Af Amer 12 (L) >60 mL/min   GFR calc Af Amer 14 (L) >60 mL/min   Anion gap 22 (H) 5 - 15    Comment: Performed at [facility], [address] [place], [place] [address]  CBC     Status: Abnormal   Collection Time: 09/28/18  2:10 AM  Result Value Ref Range   WBC 7.6 4.0 - 10.5 K/uL   RBC 3.31 (L) 4.22 - 5.81 MIL/uL   Hemoglobin 9.6 (L) 13.0 - 17.0 g/dL   HCT 31.6 (L) 39.0 - 52.0 %   MCV 95.5 80.0 - 100.0 fL   MCH 29.0 26.0 - 34.0 pg   MCHC 30.4 30.0 - 36.0 g/dL   RDW 15.7 (H) 11.5 - 15.5 %   Platelets 260 150 - 400 K/uL   nRBC 0.0 0.0 - 0.2 %    Comment: Performed at [facility] [address] [place], [place] [address]  Brain natriuretic peptide     Status: Abnormal   Collection Time: 09/28/18  2:10 AM  Result Value Ref Range   B Natriuretic Peptide 1,315.0 (H) 0.0 - 100.0 pg/mL    Comment: Performed at [facility] [address], [place], [place] [address]  SARS Coronavirus 2 (CEPHEID - Performed in [facility] hospital lab), Hosp Order     Status: None   Collection Time: 09/28/18  2:25 AM  Result Value Ref Range   SARS Coronavirus 2 NEGATIVE NEGATIVE    Comment: (NOTE) If result is NEGATIVE SARS-CoV-2 target nucleic acids are NOT DETECTED. The SARS-CoV-2 RNA is generally detectable in upper and lower  respiratory specimens during the acute phase of infection. The lowest  concentration of SARS-CoV-2 viral copies this assay can detect is 250  copies / mL. A negative result does not  preclude SARS-CoV-2 infection  and should not be used as the sole basis for treatment or other  patient management decisions.  A negative result may occur with  improper specimen collection / handling, submission of specimen other  than nasopharyngeal swab, presence of viral mutation(s) within the  areas targeted by this assay, and inadequate number of viral copies  (<250 copies / mL). A negative result must be combined with clinical  observations, patient history, and epidemiological information. If result is POSITIVE SARS-CoV-2 target nucleic acids are DETECTED. The SARS-CoV-2 RNA is generally detectable in upper and lower  respiratory specimens dur ing the acute phase of infection.  Positive  results are indicative of active infection with SARS-CoV-2.  Clinical  correlation with patient history and other diagnostic information is  necessary to determine patient infection status.  Positive results do  not rule out bacterial infection or co-infection with other viruses. If result is PRESUMPTIVE POSTIVE SARS-CoV-2 nucleic acids MAY BE PRESENT.   A presumptive positive result was obtained on the submitted specimen  and confirmed on repeat testing.  While 2019 novel coronavirus  (SARS-CoV-2) nucleic acids may be present in the submitted sample  additional confirmatory testing may be necessary for epidemiological  and / or clinical management purposes  to differentiate between  SARS-CoV-2 and other Sarbecovirus currently known to infect humans.  If clinically indicated additional testing with an alternate test  methodology [id number]) is advised. The SARS-CoV-2 RNA is generally  detectable in upper and lower respiratory sp ecimens during the acute  phase of infection. The expected result is Negative. Fact Sheet for Patients:  StrictlyIdeas.no Fact Sheet for Healthcare Providers: BankingDealers.co.za This test is not yet approved or cleared by  the [place] FDA and has been authorized for detection and/or diagnosis of SARS-CoV-2 by FDA under an Emergency Use Authorization (EUA).  This EUA will remain in effect (meaning this test can be used) for  the duration of the COVID-19 declaration under Section 564(b)(1) of the Act, 21 U.S.C. section 360bbb-3(b)(1), unless the authorization is terminated or revoked sooner. Performed at [facility], [place] [address] [place], [place] [address]   Basic metabolic panel     Status: Abnormal   Collection Time: 09/28/18  8:29 AM  Result Value Ref Range   Sodium 134 (L) 135 - 145 mmol/L   Potassium 6.2 (H) 3.5 - 5.1 mmol/L   Chloride 98 98 - 111 mmol/L   CO2 19 (L) 22 - 32 mmol/L   Glucose, Bld 122 (H) 70 - 99 mg/dL   BUN 86 (H) 8 - 23 mg/dL   Creatinine, Ser 4.24 (H) 0.61 - 1.24 mg/dL   Calcium 9.0 8.9 - 10.3 mg/dL   GFR calc non Af Amer 12 (L) >60 mL/min   GFR calc Af Amer 14 (L) >60 mL/min   Anion gap 17 (H) 5 - 15    Comment: Performed at [facility] [address], [place], [place] [address]  CBC     Status: Abnormal   Collection Time: 09/28/18  8:29 AM  Result Value Ref Range   WBC 11.6 (H) 4.0 - 10.5 K/uL   RBC 3.11 (L) 4.22 - 5.81 MIL/uL   Hemoglobin 9.0 (L) 13.0 - 17.0 g/dL   HCT 28.5 (L) 39.0 - 52.0 %   MCV 91.6 80.0 - 100.0 fL   MCH 28.9 26.0 - 34.0 pg   MCHC 31.6 30.0 - 36.0 g/dL   RDW 15.7 (H) 11.5 - 15.5 %   Platelets 244 150 - 400 K/uL   nRBC 0.0 0.0 - 0.2 %    Comment: Performed at [facility], [place] [address], [place], [place] [address]  Ferritin     Status: Abnormal   Collection Time: 09/28/18  8:29 AM  Result Value Ref Range   Ferritin 1,089 (H) 24 - 336 ng/mL    Comment: Performed at [facility], [place] [address], [place], [place] [address]  Iron and TIBC     Status: Abnormal   Collection Time: 09/28/18  8:29 AM  Result Value Ref Range   Iron 43 (L) 45 - 182 ug/dL   TIBC 294 250 - 450 ug/dL   Saturation Ratios 15 (L) 17.9 - 39.5  %   UIBC 251 ug/dL    Comment: Performed at [facility], [place] [address], [place], [place] [address]  TSH     Status: None   Collection Time: 09/28/18  8:29 AM  Result Value Ref Range   TSH 1.155 0.350 - 4.500 uIU/mL    Comment: Performed by a 3rd Generation assay with a functional sensitivity of <=0.01 uIU/mL. Performed at [facility], [place] [address], [place],  [address]     MICRO: 5/7 blood cx ngtd -historical IMAGING: Dg Chest Portable 1 View  Result Date: 09/28/2018 CLINICAL DATA:  85 y/o  M; shortness of breath all day. EXAM: PORTABLE CHEST 1 VIEW COMPARISON:  09/25/2018 chest radiograph FINDINGS: Cardiomegaly and 3 lead pacemaker. Post median sternotomy and CABG. Increased diffuse reticular and patchy bibasilar opacities of the lungs. Probable small bilateral pleural effusions. Right PICC catheter tip projects over the lower SVC. No acute osseous abnormality identified. IMPRESSION: Increased diffuse reticular and patchy bibasilar opacities, likely pulmonary edema. Probable small bilateral pleural effusions. Electronically Signed   By: Kristine Garbe M.D.   On: 09/28/2018 02:35    Assessment/Plan:  85yo  M with SOB, nausea from CHF and acute on chronic kidney disease possibly related to bactrim for which he was receiving in concert with ampicillin for listeria bacteremia  Listeria bacteremia = plan to treat with monotherapy with ampicillin x 3 wk, through may 25th  Acute on chronic kidney injury = will have pharmacy renally dose ampicillin, and likely transient insult from bactrim and poor po intake  Nausea = treat as needed  Deconditioning = reassess towards end of admission for placement.

## 2018-09-28 NOTE — ED Provider Notes (Signed)
[facility] EMERGENCY DEPARTMENT Provider Note   CSN: [id number] Arrival date & time: 09/28/18  0150    History   Chief Complaint Chief Complaint  Patient presents with  . Shortness of Breath   Level 5 caveat due to acuity of condition HPI Marcus [name] is a 85 y.o. male.     The history is provided by the patient.  Shortness of Breath  Severity:  Moderate Onset quality:  Gradual Duration:  1 day Timing:  Constant Progression:  Worsening Chronicity:  Recurrent Relieved by:  Nothing Worsened by:  Nothing Patient with history of atrial flutter, CHF, CAD, pacemaker in place presents with increasing shortness of breath.  EMS reports patient was hypoxic when they arrived.  Patient appears to be having a CHF exacerbation.  He was just recently in the hospital.  Past Medical History:  Diagnosis Date  . Atrial flutter ([place])    a. Dx 07/2014, s/p TEE/DCCV.  . Cataracts, bilateral   . Chronic systolic CHF (congestive heart failure) ([place])    a. s/p CRT-D 09/2013. b. Device explant with temp perm then subsequent CRT-P 07/2014.  Marland Kitchen CKD (chronic kidney disease), stage III ([place])   . Coronary artery disease    a. s/p remote CABG x 2(VG->OM, LIMA->LAD;  b. Late 90's s/p PCI of RCA;  c. 12/09 Cath/PCI: LM 78m 70-80d (3.0x153mXience DES), LAD100p, LCX 80-90p (2.25x2819mxus Atom DES), RCA 100d (2.5x20m58mence DES), VG->OM 100, LIMA->LAD nl, EF 30-35%.  . DVMarland Kitchen (deep venous thrombosis) (HCC)[place]17   Right knee  . Hard of hearing   . History of colon polyps   . History of gastric ulcer   . History of gout   . History of shingles   . Hyperlipidemia   . Hypertension   . Ischemic cardiomyopathy    a. EF 35-40% by echo 05/2012, b. EF 20-25%, akinesis and scarring of inferolateral, inferior and inferoseptal myocardium, mild AI, mod MR, LA mod dilated, RV mildly dialted and sys fx mildly reduced, RA mildly dilated (09/2013)  c. EF 35-40% by 2D in 07/2014, 25-30% by TEE.  .  Macular degeneration, dry   . 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 PAF (paroxysmal atrial fibrillation) (HCC)[place]. Peripheral vascular disease (HCC)[place]. Presence of permanent cardiac pacemaker 07/23/2014   [id number] #[id number] cancer (HCC)[place]. Symptomatic bradycardia    a. 08/2003 s/p MDT Enpulse [id number] Dual chamber PPM ser # [id number]. Device explant due to infection/temp perm insertion/PPM 07/2014.    Patient Active Problem List   Diagnosis Date Noted  . Bacteremia   . Hypotension 09/23/2018  . CAP (community acquired pneumonia) 09/23/2018  . Elevated troponin 09/23/2018  . AKI (acute kidney injury) (HCC)[place]/09/2018  . CKD (chronic kidney disease) stage 4, GFR 15-29 ml/min (HCC) 09/23/2018  . Frequent PVCs 02/23/2015  . Chronic systolic CHF (congestive heart failure) (HCC)[place]. 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 CKD (chronic kidney disease) stage 3, GFR 30-59 ml/min (HCC) 11/25/2013  . Pacemaker pocket hematoma 10/22/2013  . Acute on chronic systolic heart failure (HCC)[place]/19/2015  . H/O noncompliance with medical treatment, presenting hazards to health 08/31/2013  . Peripheral vascular disease, unspecified (HCC)[place]/05/2013  . Arterial embolism of right leg (HCC)[place]/04/2014  . Ischemic leg 07/29/2013  . Chronic systolic heart failure (HCC)[place]/07/2012  . Paroxysmal atrial flutter (HCC)[place]/01/2013  . Acute on chronic systolic CHF (  congestive heart failure) ([place]) 06/28/2012  . Ischemic cardiomyopathy 06/28/2012  . Hyperlipidemia 06/28/2012  . Normocytic anemia 06/28/2012  . Anemia 06/25/2012  . Pacemaker-Medtronic 02/01/2012  . HTN (hypertension) 02/28/2011  . Essential hypertension, benign 06/16/2010  . CAD 06/16/2010  . Atrial fibrillation ([place]) 06/16/2010  . SICK SINUS SYNDROME 06/16/2010    Past Surgical History:  Procedure Laterality Date  . ANGIOPLASTY     stent placement  . APPENDECTOMY    . BI-VENTRICULAR PACEMAKER INSERTION  [name] 07/23/2014   Procedure: BI-VENTRICULAR PACEMAKER INSERTION (CRT-P);  Surgeon: Deboraha Sprang, MD;  Location[facility] CATH LAB;  Service: Cardiovascular;  Laterality: [name];  . BI-VENTRICULAR PACEMAKER UPGRADE [name] 10/08/2013   Procedure: BI-VENTRICULAR PACEMAKER UPGRADE;  Surgeon: [name], MD;  Location: [facility] CATH LAB;  Service: Cardiovascular;  Laterality: [name];  . CARDIAC CATHETERIZATION  most recent in 2009   several  . CARDIAC CATHETERIZATION Right 07/19/2014   Procedure: TEMPORARY PACEMAKER;  Surgeon: [name], MD;  Location: [facility];  Service: Cardiovascular;  Laterality: Right;  . CARDIAC CATHETERIZATION [name] 03/01/2016   Procedure: Right Heart Cath;  Surgeon: [name], MD;  Location: [facility] CV LAB;  Service: Cardiovascular;  Laterality: [name];  . CARDIOVERSION  06/26/2012   Procedure: CARDIOVERSION;  Surgeon: Lelon Perla, MD;  Location: [facility] ENDOSCOPY;  Service: Cardiovascular;  Laterality: [name];  . CARDIOVERSION [name] 07/27/2014   Procedure: CARDIOVERSION;  Surgeon: [name], MD;  Location: [facility];  Service: Cardiovascular;  Laterality: [name];  . CATARACT EXTRACTION W/PHACO Left 09/24/2016   Procedure: CATARACT EXTRACTION PHACO AND INTRAOCULAR LENS PLACEMENT ([place])  Left;  Surgeon: Leandrew Koyanagi, MD;  Location: [facility];  Service: Ophthalmology;  Laterality: Left;  . CATARACT EXTRACTION W/PHACO Right 10/17/2016   Procedure: CATARACT EXTRACTION PHACO AND INTRAOCULAR LENS PLACEMENT ([place])  Right;  Surgeon: Leandrew Koyanagi, MD;  Location: [facility];  Service: Ophthalmology;  Laterality: Right;  prefers mid to late morning arrival  . COLONOSCOPY    . CORONARY ANGIOPLASTY     couple   . CORONARY ARTERY BYPASS GRAFT  1990   left main  . DOPPLER ECHOCARDIOGRAPHY  06/24/2002   EF 60-65%  . EMBOLECTOMY Right 07/28/2013   Procedure: Right Popliteal and Tibial Embolectomy;  Surgeon: Angelia Mould, MD;  Location: [facility];  Service: Vascular;   Laterality: Right;  . INSERT / REPLACE / REMOVE PACEMAKER     1st pacemaker placed 10+yrs ago  . PACEMAKER LEAD REMOVAL Right 07/19/2014   Procedure: PACEMAKER LEAD REMOVAL/EXTRACTION;  Surgeon: [name], MD;  Location: [facility];  Service: Cardiovascular;  Laterality: Right;  DR. Lucianne Lei TRIGT TO BACK UP CASE  . PERMANENT PACEMAKER GENERATOR CHANGE [name] 08/27/2012   Procedure: PERMANENT PACEMAKER GENERATOR CHANGE;  Surgeon: [name], MD;  Location: [facility] CATH LAB;  Service: Cardiovascular;  Laterality: [name];  . TEE WITHOUT CARDIOVERSION  06/26/2012   Procedure: TRANSESOPHAGEAL ECHOCARDIOGRAM (TEE);  Surgeon: Lelon Perla, MD;  Location: [facility] ENDOSCOPY;  Service: Cardiovascular;  Laterality: [name];  . TEE WITHOUT CARDIOVERSION [name] 07/27/2014   Procedure: TRANSESOPHAGEAL ECHOCARDIOGRAM (TEE);  Surgeon: [name], MD;  Location: [facility];  Service: Cardiovascular;  Laterality: [name];        Home Medications    Prior to Admission medications   Medication Sig Start Date End Date Taking? Authorizing Provider  acetaminophen (TYLENOL) 325 MG tablet Take 2 tablets (650 mg total) by mouth every 6 (six) hours as needed for mild pain or  fever (or Fever >/= 101). 09/26/18   Pokhrel, Corrie Mckusick, MD  amiodarone (PACERONE) 200 MG tablet TAKE 1/2 TABLET (100MG     TOTAL) DAILY Patient taking differently: Take 100 mg by mouth daily.  08/27/18   [name], MD  ampicillin IVPB Inject 2 g into the vein every 8 (eight) hours for 17 days. As a continuous infusion Indication:  Listeria Bacteremia  Last Day of Therapy:  10/13/2018 Labs -Twice weekly:  CBC/D and BMP, Labs - Every other week:  ESR and CRP 09/26/18 10/13/18  Pokhrel, Laxman, MD  Ascorbic Acid (VITAMIN C) 1000 MG tablet Take 1,000 mg by mouth daily.    [provider]  b complex vitamins tablet Take 1 tablet by mouth daily.    [provider]  carvedilol (COREG) 6.25 MG tablet Take 1 tablet (6.25 mg total) by mouth 2 (two) times daily  with a meal. 09/26/18   Pokhrel, Laxman, MD  Coenzyme Q10 (CO Q 10) 100 MG CAPS Take 100 mg by mouth 2 (two) times daily.     [provider]  ELIQUIS 2.5 MG TABS tablet TAKE 1 TABLET TWICE A DAY Patient taking differently: Take 2.5 mg by mouth 2 (two) times daily.  08/27/18   [name], MD  Magnesium 250 MG TABS Take 250 mg by mouth daily.    [provider]  Multiple Vitamins-Minerals (PRESERVISION AREDS 2) CAPS Take 1 capsule by mouth 2 (two) times a day.    [provider]  NON FORMULARY Take 1 capsule by mouth See admin instructions. NAD+ (Nicotinamide Adenine Dinucleotide) capsules: Take 1 capsule by mouth once a day    [provider]  potassium chloride SA (K-DUR) 20 MEQ tablet Take 1 tablet (20 mEq total) by mouth daily. 09/26/18   Pokhrel, Corrie Mckusick, MD  Resveratrol 100 MG CAPS Take 100 mg by mouth daily.    [provider]  sulfamethoxazole-trimethoprim (BACTRIM DS) 800-160 MG tablet Take 2 tablets by mouth 2 (two) times daily for 18 days. 09/25/18 10/13/18  Pokhrel, Corrie Mckusick, MD  torsemide (DEMADEX) 20 MG tablet Take 2 tablets (40 mg total) by mouth daily. 08/22/18   [name], MD  zinc gluconate 50 MG tablet Take 50 mg by mouth daily.    [provider]    Family History Family History  Problem Relation Age of Onset  . Hypertension Mother   . Colon cancer Mother   . Cancer Mother        Colon  . Alcohol abuse Father     Social History Social History   Tobacco Use  . Smoking status: Former Smoker    Last attempt to quit: 02/10/1971    Years since quitting: 47.6  . Smokeless tobacco: Never Used  . Tobacco comment: quit smoking 14yr ago  Substance Use Topics  . Alcohol use: No    Comment: nothing in 755yr  . Drug use: No     Allergies   Patient has no known allergies.   Review of Systems Review of Systems  Unable to perform ROS: Acuity of condition  Respiratory: Positive for shortness of breath.       Physical Exam Updated Vital Signs BP 105/69   Pulse 75   Temp 98.3 F (36.8 C) (Rectal)   Resp (!) 22   SpO2 100%   Physical Exam CONSTITUTIONAL: Elderly, distress noted HEAD: Normocephalic/atraumatic EYES: EOMI/PERRL ENMT: Mucous membranes moist, NRB mask in place NECK: supple no meningeal signs SPINE/BACK:entire spine  nontender CV: S1/S2 noted LUNGS: Tachypneic, crackles bilaterally ABDOMEN: soft, nontender, no rebound or guarding, bowel sounds noted throughout abdomen GU:no cva tenderness NEURO: Pt is awake/alert/appropriate, moves all extremitiesx4.  No facial droop.   EXTREMITIES: pulses normal/equal, full ROM, no significant lower extremity edema SKIN: warm, color normal, PICC line in right arm PSYCH: no abnormalities of mood noted, alert and oriented to situation  ED Treatments / Results  Labs (all labs ordered are listed, but only abnormal results are displayed) Labs Reviewed  BASIC METABOLIC PANEL - Abnormal; Notable for the following components:      Result Value   Sodium 133 (*)    Potassium 6.2 (*)    Chloride 95 (*)    CO2 16 (*)    Glucose, Bld 130 (*)    BUN 83 (*)    Creatinine, Ser 4.20 (*)    GFR calc non Af Amer 12 (*)    GFR calc Af Amer 14 (*)    Anion gap 22 (*)    All other components within normal limits  CBC - Abnormal; Notable for the following components:   RBC 3.31 (*)    Hemoglobin 9.6 (*)    HCT 31.6 (*)    RDW 15.7 (*)    All other components within normal limits  BRAIN NATRIURETIC PEPTIDE - Abnormal; Notable for the following components:   B Natriuretic Peptide 1,315.0 (*)    All other components within normal limits  SARS CORONAVIRUS 2 (HOSPITAL ORDER, [place] LAB)    EKG EKG Interpretation  Date/Time:  Sunday Sep 28 2018 02:01:52 EDT Ventricular Rate:  79 PR Interval:    QRS Duration: 177 QT Interval:  449 QTC Calculation: 515 R Axis:   -77 Text Interpretation:  Atrial-ventricular dual-paced  rhythm No further analysis attempted due to paced rhythm Confirmed by Ripley Fraise [phone]) on 09/28/2018 2:24:52 AM   Radiology Dg Chest Portable 1 View  Result Date: 09/28/2018 CLINICAL DATA:  85 y/o  M; shortness of breath all day. EXAM: PORTABLE CHEST 1 VIEW COMPARISON:  09/25/2018 chest radiograph FINDINGS: Cardiomegaly and 3 lead pacemaker. Post median sternotomy and CABG. Increased diffuse reticular and patchy bibasilar opacities of the lungs. Probable small bilateral pleural effusions. Right PICC catheter tip projects over the lower SVC. No acute osseous abnormality identified. IMPRESSION: Increased diffuse reticular and patchy bibasilar opacities, likely pulmonary edema. Probable small bilateral pleural effusions. Electronically Signed   By: Kristine Garbe M.D.   On: 09/28/2018 02:35    Procedures Procedures  CRITICAL CARE Performed by: Sharyon Cable Total critical care time: 35 minutes Critical care time was exclusive of separately billable procedures and treating other patients. Critical care was necessary to treat or prevent imminent or life-threatening deterioration. Critical care was time spent personally by me on the following activities: development of treatment plan with patient and/or surrogate as well as nursing, discussions with consultants, evaluation of patient's response to treatment, examination of patient, obtaining history from patient or surrogate, ordering and performing treatments and interventions, ordering and review of laboratory studies, ordering and review of radiographic studies, pulse oximetry and re-evaluation of patient's condition. Patient with worsening renal failure, hyperkalemia requiring medications, and acute pulmonary edema  Medications Ordered in ED Medications  furosemide (LASIX) injection 80 mg (80 mg Intravenous Given 09/28/18 0343)     Initial Impression / Assessment and Plan / ED Course  I have reviewed the triage vital signs  and the nursing notes.  Pertinent labs &  imaging results that were available during my care of the patient were reviewed by me and considered in my medical decision making (see chart for details).        Patient presents with increasing shortness of breath.  He was just recently discharged on May 8 after being found to have community acquired pneumonia that was COVID negative, and has been receiving IV antibiotics at home Today it appears that he is in acute pulmonary edema.  He also has what appears to be worsening renal failure with hyperkalemia.  Difficult to discern EKG changes since it is paced Discussed with Dr. Delmar Landau with [facility]ad. Plan to admit to the hospital.  We agree to start Lasix here for pulmonary edema as well as his hyperkalemia. His respiratory effort is improved, does not require intubation at this time  Patient also noted to have metabolic acidosis, likely due to renal failure  I feel patient would benefit from palliative consult  Final Clinical Impressions(s) / ED Diagnoses   Final diagnoses:  Acute pulmonary edema (HCC)  Hyperkalemia  AKI (acute kidney injury) [facility])  Metabolic acidosis    ED Discharge Orders    None       Ripley Fraise, MD 09/28/18 0401

## 2018-09-28 NOTE — ED Notes (Signed)
ED TO INPATIENT HANDOFF REPORT  ED Nurse Name and Phone #: Alveta Heimlich RN [phone]  S Name/Age/Gender Marcus [name] 85 y.o. male Room/Bed: 022C/022C  Code Status   Code Status: Prior  Home/SNF/Other Home Patient oriented to: self, place, time and situation Is this baseline? Yes      Chief Complaint SOB  Triage Note Pt reports shob all day. EmS was called, pt O2 in the 85. 100% nonrebreather. CHf 97*f, 122/79. From home. Pacemaker. On lasic.    Allergies No Known Allergies  Level of Care/Admitting Diagnosis ED Disposition    ED Disposition Condition Comment   Admit  Hospital Area: [facility] [[id number]]  Level of Care: Telemetry Cardiac [103]  I expect the patient will be discharged within 24 hours: No (not a candidate for 5C-Observation unit)  Covid Evaluation: [name]  Diagnosis: Hypoxia [300808]  Admitting Physician: Vilma Prader [[id number]]  Attending Physician: Vilma Prader [[id number]]  PT Class (Do Not Modify): Observation [104]  PT Acc Code (Do Not Modify): Observation [10022]       B Medical/Surgery History Past Medical History:  Diagnosis Date  . Atrial flutter ([place])    a. Dx 07/2014, s/p TEE/DCCV.  . Cataracts, bilateral   . Chronic systolic CHF (congestive heart failure) ([place])    a. s/p CRT-D 09/2013. b. Device explant with temp perm then subsequent CRT-P 07/2014.  Marland Kitchen CKD (chronic kidney disease), stage III ([place])   . Coronary artery disease    a. s/p remote CABG x 2(VG->OM, LIMA->LAD;  b. Late 90's s/p PCI of RCA;  c. 12/09 Cath/PCI: LM 68m, 70-80d (3.0x76mm Xience DES), LAD100p, LCX 80-90p (2.25x67mmTaxus Atom DES), RCA 100d (2.5x20mm Xience DES), VG->OM 100, LIMA->LAD nl, EF 30-35%.  Marland Kitchen DVT (deep venous thrombosis) ([place]) 2017   Right knee  . Hard of hearing   . History of colon polyps   . History of gastric ulcer   . History of gout   . History of shingles   . Hyperlipidemia   . Hypertension   . Ischemic cardiomyopathy    a. EF  35-40% by echo 05/2012, b. EF 20-25%, akinesis and scarring of inferolateral, inferior and inferoseptal myocardium, mild AI, mod MR, LA mod dilated, RV mildly dialted and sys fx mildly reduced, RA mildly dilated (09/2013)  c. EF 35-40% by 2D in 07/2014, 25-30% by TEE.  . Macular degeneration, dry   . Pacemaker infection ([place])   . PAF (paroxysmal atrial fibrillation) ([place])   . Peripheral vascular disease ([place])   . Presence of permanent cardiac pacemaker 07/23/2014   [facility] [id number], Serial [id number]  . Prostate cancer ([place])   . Symptomatic bradycardia    a. 08/2003 s/p MDT Enpulse [id number] Dual chamber PPM ser # [id number] H. b. Device explant due to infection/temp perm insertion/PPM 07/2014.   Past Surgical History:  Procedure Laterality Date  . ANGIOPLASTY     stent placement  . APPENDECTOMY    . BI-VENTRICULAR PACEMAKER INSERTION [name] 07/23/2014   Procedure: BI-VENTRICULAR PACEMAKER INSERTION (CRT-P);  Surgeon: Deboraha Sprang, MD;  [facility] CATH LAB;  Service: Cardiovascular;  Laterality: [name];  . BI-VENTRICULAR PACEMAKER UPGRADE [name] 10/08/2013   Procedure: BI-VENTRICULAR PACEMAKER UPGRADE;  Surgeon: [name], MD;  Location: [facility] CATH LAB;  Service: Cardiovascular;  Laterality: [name];  . CARDIAC CATHETERIZATION  most recent in 2009   several  . CARDIAC CATHETERIZATION Right 07/19/2014   Procedure: TEMPORARY PACEMAKER;  Surgeon: [name], MD;  [facility]  OR;  Service: Cardiovascular;  Laterality: Right;  . CARDIAC CATHETERIZATION [name] 03/01/2016   Procedure: Right Heart Cath;  Surgeon: [name], MD;  Location: [facility] CV LAB;  Service: Cardiovascular;  Laterality: [name];  . CARDIOVERSION  06/26/2012   Procedure: CARDIOVERSION;  Surgeon: Lelon Perla, MD;  Location: [facility] ENDOSCOPY;  Service: Cardiovascular;  Laterality: [name];  . CARDIOVERSION [name] 07/27/2014   Procedure: CARDIOVERSION;  Surgeon: [name], MD;  Location: [facility];  Service: Cardiovascular;   Laterality: [name];  . CATARACT EXTRACTION W/PHACO Left 09/24/2016   Procedure: CATARACT EXTRACTION PHACO AND INTRAOCULAR LENS PLACEMENT ([place])  Left;  Surgeon: Leandrew Koyanagi, MD;  Location: [facility];  Service: Ophthalmology;  Laterality: Left;  . CATARACT EXTRACTION W/PHACO Right 10/17/2016   Procedure: CATARACT EXTRACTION PHACO AND INTRAOCULAR LENS PLACEMENT ([place])  Right;  Surgeon: Leandrew Koyanagi, MD;  Location: [facility];  Service: Ophthalmology;  Laterality: Right;  prefers mid to late morning arrival  . COLONOSCOPY    . CORONARY ANGIOPLASTY     couple   . CORONARY ARTERY BYPASS GRAFT  1990   left main  . DOPPLER ECHOCARDIOGRAPHY  06/24/2002   EF 60-65%  . EMBOLECTOMY Right 07/28/2013   Procedure: Right Popliteal and Tibial Embolectomy;  Surgeon: Angelia Mould, MD;  Location: [facility];  Service: Vascular;  Laterality: Right;  . INSERT / REPLACE / REMOVE PACEMAKER     1st pacemaker placed 10+yrs ago  . PACEMAKER LEAD REMOVAL Right 07/19/2014   Procedure: PACEMAKER LEAD REMOVAL/EXTRACTION;  Surgeon: [name], MD;  Location: [place];  Service: Cardiovascular;  Laterality: Right;  DR. Lucianne Lei TRIGT TO BACK UP CASE  . PERMANENT PACEMAKER GENERATOR CHANGE [name] 08/27/2012   Procedure: PERMANENT PACEMAKER GENERATOR CHANGE;  Surgeon: [name], MD;  Location: [facility] CATH LAB;  Service: Cardiovascular;  Laterality: [name];  . TEE WITHOUT CARDIOVERSION  06/26/2012   Procedure: TRANSESOPHAGEAL ECHOCARDIOGRAM (TEE);  Surgeon: Lelon Perla, MD;  Location: [facility] ENDOSCOPY;  Service: Cardiovascular;  Laterality: [name];  . TEE WITHOUT CARDIOVERSION [name] 07/27/2014   Procedure: TRANSESOPHAGEAL ECHOCARDIOGRAM (TEE);  Surgeon: [name], MD;  Location: [place];  Service: Cardiovascular;  Laterality: [name];     A IV Location/Drains/Wounds Patient Lines/Drains/Airways Status   Active Line/Drains/Airways    Name:   Placement date:   Placement time:   Site:   Days:   Peripheral  IV 09/28/18 Left Hand   09/28/18    0132    Hand   less than 1   PICC Double Lumen 09/25/18 PICC Right 37 cm 0 cm   09/25/18    1157     3   Post Cath / Sheath 03/01/16 Right Venous;Brachial   03/01/16    1343    Venous;Brachial   941   Incision (Closed) 07/28/13 Leg Right   07/28/13    2316     1888   Incision (Closed) 07/29/13 Leg Right   07/29/13    0020     1887   Incision (Closed) 10/08/13 Chest Right   10/08/13    2000     1816   Incision (Closed) 07/19/14 Groin Right   07/19/14    1055     1532   Incision (Closed) 07/19/14 Chest Right   07/19/14    1055     1532   Incision (Closed) 07/23/14 Chest Left;Anterior   07/23/14    1545     1528   Incision (Closed) 09/24/16 Eye  Left   09/24/16    1000     734   Incision (Closed) 10/17/16 Eye Right   10/17/16    1253     711          Intake/Output Last 24 hours No intake or output data in the 24 hours ending 09/28/18 0412  Labs/Imaging Results for orders placed or performed during the hospital encounter of 09/28/18 (from the past 48 hour(s))  Basic metabolic panel     Status: Abnormal   Collection Time: 09/28/18  2:10 AM  Result Value Ref Range   Sodium 133 (L) 135 - 145 mmol/L   Potassium 6.2 (H) 3.5 - 5.1 mmol/L    Comment: NO VISIBLE HEMOLYSIS   Chloride 95 (L) 98 - 111 mmol/L   CO2 16 (L) 22 - 32 mmol/L   Glucose, Bld 130 (H) 70 - 99 mg/dL   BUN 83 (H) 8 - 23 mg/dL   Creatinine, Ser 4.20 (H) 0.61 - 1.24 mg/dL   Calcium 9.0 8.9 - 10.3 mg/dL   GFR calc non Af Amer 12 (L) >60 mL/min   GFR calc Af Amer 14 (L) >60 mL/min   Anion gap 22 (H) 5 - 15    Comment: Performed at [facility], [address] [place], [place] [address]  CBC     Status: Abnormal   Collection Time: 09/28/18  2:10 AM  Result Value Ref Range   WBC 7.6 4.0 - 10.5 K/uL   RBC 3.31 (L) 4.22 - 5.81 MIL/uL   Hemoglobin 9.6 (L) 13.0 - 17.0 g/dL   HCT 31.6 (L) 39.0 - 52.0 %   MCV 95.5 80.0 - 100.0 fL   MCH 29.0 26.0 - 34.0 pg   MCHC 30.4 30.0 - 36.0 g/dL    RDW 15.7 (H) 11.5 - 15.5 %   Platelets 260 150 - 400 K/uL   nRBC 0.0 0.0 - 0.2 %    Comment: Performed at [facility], [place] [address], [place], [place] [address]  Brain natriuretic peptide     Status: Abnormal   Collection Time: 09/28/18  2:10 AM  Result Value Ref Range   B Natriuretic Peptide 1,315.0 (H) 0.0 - 100.0 pg/mL    Comment: Performed at [facility] [address], [place], [place] [address]  SARS Coronavirus 2 (CEPHEID - Performed in [facility] hospital lab), Hosp Order     Status: None   Collection Time: 09/28/18  2:25 AM  Result Value Ref Range   SARS Coronavirus 2 NEGATIVE NEGATIVE    Comment: (NOTE) If result is NEGATIVE SARS-CoV-2 target nucleic acids are NOT DETECTED. The SARS-CoV-2 RNA is generally detectable in upper and lower  respiratory specimens during the acute phase of infection. The lowest  concentration of SARS-CoV-2 viral copies this assay can detect is 250  copies / mL. A negative result does not preclude SARS-CoV-2 infection  and should not be used as the sole basis for treatment or other  patient management decisions.  A negative result may occur with  improper specimen collection / handling, submission of specimen other  than nasopharyngeal swab, presence of viral mutation(s) within the  areas targeted by this assay, and inadequate number of viral copies  (<250 copies / mL). A negative result must be combined with clinical  observations, patient history, and epidemiological information. If result is POSITIVE SARS-CoV-2 target nucleic acids are DETECTED. The SARS-CoV-2 RNA is generally detectable in upper and lower  respiratory  specimens dur ing the acute phase of infection.  Positive  results are indicative of active infection with SARS-CoV-2.  Clinical  correlation with patient history and other diagnostic information is  necessary to determine patient infection status.  Positive results do  not rule out bacterial infection or  co-infection with other viruses. If result is PRESUMPTIVE POSTIVE SARS-CoV-2 nucleic acids MAY BE PRESENT.   A presumptive positive result was obtained on the submitted specimen  and confirmed on repeat testing.  While 2019 novel coronavirus  (SARS-CoV-2) nucleic acids may be present in the submitted sample  additional confirmatory testing may be necessary for epidemiological  and / or clinical management purposes  to differentiate between  SARS-CoV-2 and other Sarbecovirus currently known to infect humans.  If clinically indicated additional testing with an alternate test  methodology [phone]) is advised. The SARS-CoV-2 RNA is generally  detectable in upper and lower respiratory sp ecimens during the acute  phase of infection. The expected result is Negative. Fact Sheet for Patients:  StrictlyIdeas.no Fact Sheet for Healthcare Providers: BankingDealers.co.za This test is not yet approved or cleared by the [place] FDA and has been authorized for detection and/or diagnosis of SARS-CoV-2 by FDA under an Emergency Use Authorization (EUA).  This EUA will remain in effect (meaning this test can be used) for the duration of the COVID-19 declaration under Section 564(b)(1) of the Act, 21 U.S.C. section 360bbb-3(b)(1), unless the authorization is terminated or revoked sooner. Performed at [facility], [place] [address] [place], [place] [address]    Dg Chest Portable 1 View  Result Date: 09/28/2018 CLINICAL DATA:  85 y/o  M; shortness of breath all day. EXAM: PORTABLE CHEST 1 VIEW COMPARISON:  09/25/2018 chest radiograph FINDINGS: Cardiomegaly and 3 lead pacemaker. Post median sternotomy and CABG. Increased diffuse reticular and patchy bibasilar opacities of the lungs. Probable small bilateral pleural effusions. Right PICC catheter tip projects over the lower SVC. No acute osseous abnormality identified. IMPRESSION: Increased diffuse  reticular and patchy bibasilar opacities, likely pulmonary edema. Probable small bilateral pleural effusions. Electronically Signed   By: Kristine Garbe M.D.   On: 09/28/2018 02:35    Pending Labs [employer] (From admission, onward)    Start     Ordered   Signed and Held  TSH  Tomorrow morning,   R     Signed and Held   Signed and Held  CBC  Tomorrow morning,   R     Signed and Held   Signed and Occupational hygienist morning,   R     Signed and Held   Signed and Held  Iron and TIBC  Tomorrow morning,   R     Signed and Held   Signed and Held  Ferritin  Tomorrow morning,   R     Signed and Held          Vitals/Pain Today's Vitals   09/28/18 0215 09/28/18 0230 09/28/18 0245 09/28/18 0308  BP: 98/67 112/81 101/71 105/69  Pulse: 72 78 85 75  Resp: (!) 24 (!) 31 20 (!) 22  Temp:      TempSrc:      SpO2: 97% (!) 86% 96% 100%  PainSc:        Isolation Precautions No active isolations  Medications Medications  furosemide (LASIX) injection 80 mg (80 mg Intravenous Given 09/28/18 0343)    Mobility walks Moderate fall risk   Focused Assessments Pulmonary Assessment Handoff:  Lung  sounds:   O2 Device: NRB        R Recommendations: See Admitting Provider Note  Report given to:   Additional Notes:

## 2018-09-28 NOTE — Progress Notes (Signed)
PROGRESS NOTE   Marcus [name]  VEH:[id number]    DOB: Apr 19, 1929    DOA: 09/28/2018  PCP: Marcus [name]   I have briefly reviewed patients previous medical records in [facility].  Brief Narrative:  85 year old married male, PMH of PAF on Eliquis, SSS s/p PPM, ICM (LVEF 83-66%), chronic systolic CHF, CAD s/p CABG & PCI's, stage IV CKD, HLD, HTN,, DVT, Hard of Hearing, recent hospitalization 5/4-5/8 for Listeria bacteremia, suspected pneumonia, acute on chronic systolic CHF, acute on chronic kidney disease, was discharged on ampicillin + high-dose Bactrim through 10/13/2018 and torsemide, presented back to [facility] on 5/10 due to progressive dyspnea, weakness, nausea, vomiting and lower extremity edema.  Admitted for acute on chronic systolic CHF, acute on stage IV chronic kidney disease with hyperkalemia and metabolic acidosis and acute hypoxic respiratory failure.  Discussed with and consulted cardiology, ID and PMD.   Assessment & Plan:   Active Problems:   Hypoxia   1. Acute on chronic systolic CHF/ICM: TTE 2/94/7654: LVEF 35-40%, akinesis of inferolateral and inferior myocardium and grade 3 diastolic dysfunction.  Received a dose of IV Lasix 80 mg at around 3 AM, no intake or output charted.  Remains clearly volume overloaded.  Trial of Lasix 80 mg IV every 12 hours x2 doses.  Discussed with RN regarding strict intake output and daily weights.  Acute CHF likely precipitated by volume overload related to acute on chronic kidney disease from high-dose Bactrim.  Cardiology input appreciated.  Continue carvedilol.  Not on Aldactone, ACEI/ARB/ARN I due to CKD.  Repeat TTE ordered. 2. Acute on stage IV chronic kidney disease: Creatinine 3.24 at discharge on 5/8.  Presented on 5/10 with the following BMP: Potassium 6.2, bicarbonate 16, BUN 83 and creatinine 4.2.  Multifactorial acute kidney injury but most likely due to high-dose Bactrim.  Bactrim discontinued.  Diuresing for decompensated  CHF.  Monitor BMP closely.  If worsens, may consider Nephrology consultation but patient clearly not a long-term HD candidate. Has seen Marcus [name] once as OP. 3. Hyperkalemia: Presented with potassium of 6.2.  Due to Bactrim, AoCKD and potassium supplements.  Renal diet/low potassium diet, patient refused Kayexalate early this morning, ordered Lokelma 10 g x 1 dose, EKG shows AV paced rhythm with BBB morphology.  Follow BMP this evening and then closely thereafter. 4. Acute respiratory failure with hypoxia: Secondary to decompensated CHF.  Treat as above and oxygen supplementation.  PRN albuterol nebulizations. 5. Listeria bacteremia: Continue IV ampicillin.  Bactrim discontinued.  ID consulted.  SARS coronavirus 2 testing x2: Negative.  Blood cultures x2 from 5/7: Negative to date. 6. Essential hypertension: Soft blood pressures.  Continue carvedilol 6.25 mg twice daily. 7. PAF: Currently AV paced and controlled rate.  Remains on Eliquis but need to closely monitor for worsening renal insufficiency in which case may have to discontinue.  Continue amiodarone & carvedilol. 8. SSS s/PPM: 9. CAD status post CABG & PCI's: No chest pain reported. 10. Anemia of chronic disease: Stable   DVT prophylaxis: Currently on Eliquis Code Status: Partial code: Wants intubation/mechanical ventilation, BiPAP, ACLS meds but no CPR or shocking. Family Communication: I called and discussed in detail with patient spouse, updated critical nature of his illness and ongoing care in detail. Disposition: To be determined pending clinical improvement.  Patient is critically ill with multiple organ failure as noted above who was admitted overnight as observation but clearly needs several more days of inpatient management.  Thereby changed status to  inpatient on 5/10.   Consultants:  Cardiology ID PMT  Procedures:  None  Antimicrobials:  IV ampicillin   Subjective: Patient interviewed and examined along  with PMT at bedside.  Reports feeling somewhat better.  Still with dyspnea on minimal exertion.  Reported urinating but no intake output charted.  Denies chest pain.  ROS: As above, otherwise negative  Objective:  Vitals:   09/28/18 0400 09/28/18 0415 09/28/18 0509 09/28/18 0757  BP: 95/68 100/71 110/72 104/70  Pulse: (!) 47 71 70 70  Resp: (!) 27 19 (!) 32 18  Temp:   97.6 F (36.4 C) 97.6 F (36.4 C)  TempSrc:      SpO2: 97% 95% 90% 92%  Weight:   84.3 kg   Height:   5\' 9"  (1.753 m)     Examination:  General exam: Elderly male, moderately built and frail, ill looking, sitting up in bed with mild increased work of breathing.  Slightly hard of hearing. Respiratory system: Harsh breath sounds bilaterally.  Mostly clear anteriorly but decreased breath sounds posteriorly with extensive crackles.  Occasional wheezing.  Mild increased work of breathing which worsens with minimal activity such as leaning forward. Cardiovascular system: S1 & S2 heard, RRR. No murmurs, rubs, gallops or clicks.  2+ pitting bilateral leg edema.  JVD +.  Telemetry personally reviewed: AV paced rhythm. Gastrointestinal system: Abdomen is nondistended, soft and nontender. No organomegaly or masses felt. Normal bowel sounds heard. Central nervous system: Alert and oriented x2. No focal neurological deficits. Extremities: Symmetric 5 x 5 power. Skin: No rashes, lesions or ulcers Psychiatry: Judgement and insight appear somewhat impaired. Mood & affect appropriate.     Data Reviewed: I have personally reviewed following labs and imaging studies  CBC: Recent Labs  Lab 09/24/18 0229 09/25/18 0552 09/26/18 0945 09/28/18 0210 09/28/18 0829  WBC 7.0 6.0 5.7 7.6 11.6*  NEUTROABS 5.7 4.8 4.4  --   --   HGB 9.9* 9.4* 8.8* 9.6* 9.0*  HCT 31.3* 29.4* 27.6* 31.6* 28.5*  MCV 91.8 90.5 90.2 95.5 91.6  PLT 181 172 195 260 503   Basic Metabolic Panel: Recent Labs  Lab 09/23/18 0209 09/24/18 0229 09/25/18 0552  09/26/18 0945 09/28/18 0210 09/28/18 0829  NA 138 140 136 135 133* 134*  K 4.1 3.7 3.4* 3.7 6.2* 6.2*  CL 102 103 97* 100 95* 98  CO2 17* 23 23 23  16* 19*  GLUCOSE 119* 122* 102* 125* 130* 122*  BUN 91* 85* 80* 72* 83* 86*  CREATININE 3.66* 3.32* 2.99* 3.24* 4.20* 4.24*  CALCIUM 9.0 8.7* 8.4* 8.4* 9.0 9.0  PHOS 4.0  --   --   --   --   --    Liver Function Tests: Recent Labs  Lab 09/22/18 1933 09/23/18 0209  AST 20  --   ALT 13  --   ALKPHOS 66  --   BILITOT 1.1  --   PROT 7.9  --   ALBUMIN 3.5 3.2*   Cardiac Enzymes: Recent Labs  Lab 09/23/18 0209 09/23/18 0711 09/23/18 1708 09/23/18 1930 09/24/18 0229  TROPONINI 0.10* 0.12* 0.16* 0.16* 0.15*     Recent Results (from the past 240 hour(s))  Culture, blood (routine x 2)     Status: Abnormal   Collection Time: 09/22/18  7:28 PM  Result Value Ref Range Status   Specimen Description BLOOD RIGHT ANTECUBITAL  Final   Special Requests   Final    BOTTLES DRAWN AEROBIC AND ANAEROBIC Blood Culture adequate  volume   Culture  Setup Time (A)  Final    GRAM VARIABLE ROD Organism ID to follow CRITICAL RESULT CALLED TO, READ BACK BY AND VERIFIED WITH: Hughie Closs [facility] 09/23/18 1741 JDW    Culture (A)  Final    LISTERIA MONOCYTOGENES Standardized susceptibility testing for this organism is not available. HEALTH DEPARTMENT NOTIFIED Performed at [facility], [place] [address], [place], [place] [address]    Report Status 09/25/2018 FINAL  Final  Blood Culture ID Panel (Reflexed)     Status: Abnormal   Collection Time: 09/22/18  7:28 PM  Result Value Ref Range Status   Enterococcus species NOT DETECTED NOT DETECTED Final   Vancomycin resistance NOT DETECTED NOT DETECTED Final   Listeria monocytogenes DETECTED (A) NOT DETECTED Final    Comment: CRITICAL RESULT CALLED TO, READ BACK BY AND VERIFIED WITH: Hughie Closs PHARMD 09/23/18 1741 JDW    Staphylococcus species NOT DETECTED NOT DETECTED Final   Staphylococcus aureus (BCID)  NOT DETECTED NOT DETECTED Final   Methicillin resistance NOT DETECTED NOT DETECTED Final   Streptococcus species NOT DETECTED NOT DETECTED Final   Streptococcus agalactiae NOT DETECTED NOT DETECTED Final   Streptococcus pneumoniae NOT DETECTED NOT DETECTED Final   Streptococcus pyogenes NOT DETECTED NOT DETECTED Final   Acinetobacter baumannii NOT DETECTED NOT DETECTED Final   Enterobacteriaceae species NOT DETECTED NOT DETECTED Final   Enterobacter cloacae complex NOT DETECTED NOT DETECTED Final   Escherichia coli NOT DETECTED NOT DETECTED Final   Klebsiella oxytoca NOT DETECTED NOT DETECTED Final   Klebsiella pneumoniae NOT DETECTED NOT DETECTED Final   Proteus species NOT DETECTED NOT DETECTED Final   Serratia marcescens NOT DETECTED NOT DETECTED Final   Carbapenem resistance NOT DETECTED NOT DETECTED Final   Haemophilus influenzae NOT DETECTED NOT DETECTED Final   Neisseria meningitidis NOT DETECTED NOT DETECTED Final   Pseudomonas aeruginosa NOT DETECTED NOT DETECTED Final   Candida albicans NOT DETECTED NOT DETECTED Final   Candida glabrata NOT DETECTED NOT DETECTED Final   Candida krusei NOT DETECTED NOT DETECTED Final   Candida parapsilosis NOT DETECTED NOT DETECTED Final   Candida tropicalis NOT DETECTED NOT DETECTED Final    Comment: Performed at [facility], [place]. [address] [place], [place] [address]  Culture, blood (routine x 2)     Status: Abnormal   Collection Time: 09/22/18  7:47 PM  Result Value Ref Range Status   Specimen Description BLOOD LEFT ANTECUBITAL  Final   Special Requests   Final    BOTTLES DRAWN AEROBIC ONLY Blood Culture adequate volume   Culture  Setup Time   Final    AEROBIC BOTTLE ONLY GRAM VARIABLE ROD CRITICAL VALUE NOTED.  VALUE IS CONSISTENT WITH PREVIOUSLY REPORTED AND CALLED VALUE.    Culture (A)  Final    LISTERIA MONOCYTOGENES Standardized susceptibility testing for this organism is not available. HEALTH DEPARTMENT NOTIFIED  Performed at [facility], [place] [address] [place], [place] [address]    Report Status 09/25/2018 FINAL  Final  SARS Coronavirus 2 (CEPHEID - Performed in [place] hospital lab), Hosp Order     Status: None   Collection Time: 09/22/18  7:47 PM  Result Value Ref Range Status   SARS Coronavirus 2 NEGATIVE NEGATIVE Final    Comment: (NOTE) If result is NEGATIVE SARS-CoV-2 target nucleic acids are NOT DETECTED. The SARS-CoV-2 RNA is generally detectable in upper and lower  respiratory specimens during the acute phase of infection.  The lowest  concentration of SARS-CoV-2 viral copies this assay can detect is 250  copies / mL. A negative result does not preclude SARS-CoV-2 infection  and should not be used as the sole basis for treatment or other  patient management decisions.  A negative result may occur with  improper specimen collection / handling, submission of specimen other  than nasopharyngeal swab, presence of viral mutation(s) within the  areas targeted by this assay, and inadequate number of viral copies  (<250 copies / mL). A negative result must be combined with clinical  observations, patient history, and epidemiological information. If result is POSITIVE SARS-CoV-2 target nucleic acids are DETECTED. The SARS-CoV-2 RNA is generally detectable in upper and lower  respiratory specimens dur ing the acute phase of infection.  Positive  results are indicative of active infection with SARS-CoV-2.  Clinical  correlation with patient history and other diagnostic information is  necessary to determine patient infection status.  Positive results do  not rule out bacterial infection or co-infection with other viruses. If result is PRESUMPTIVE POSTIVE SARS-CoV-2 nucleic acids MAY BE PRESENT.   A presumptive positive result was obtained on the submitted specimen  and confirmed on repeat testing.  While 2019 novel coronavirus  (SARS-CoV-2) nucleic acids may be present in the  submitted sample  additional confirmatory testing may be necessary for epidemiological  and / or clinical management purposes  to differentiate between  SARS-CoV-2 and other Sarbecovirus currently known to infect humans.  If clinically indicated additional testing with an alternate test  methodology [phone]) is advised. The SARS-CoV-2 RNA is generally  detectable in upper and lower respiratory sp ecimens during the acute  phase of infection. The expected result is Negative. Fact Sheet for Patients:  StrictlyIdeas.no Fact Sheet for Healthcare Providers: BankingDealers.co.za This test is not yet approved or cleared by the [place] FDA and has been authorized for detection and/or diagnosis of SARS-CoV-2 by FDA under an Emergency Use Authorization (EUA).  This EUA will remain in effect (meaning this test can be used) for the duration of the COVID-19 declaration under Section 564(b)(1) of the Act, 21 U.S.C. section 360bbb-3(b)(1), unless the authorization is terminated or revoked sooner. Performed at [facility], [place] [address] [place], [place] [address]   MRSA PCR Screening     Status: None   Collection Time: 09/23/18  2:31 AM  Result Value Ref Range Status   MRSA by PCR NEGATIVE NEGATIVE Final    Comment:        The GeneXpert MRSA Assay (FDA approved for NASAL specimens only), is one component of a comprehensive MRSA colonization surveillance program. It is not intended to diagnose MRSA infection nor to guide or monitor treatment for MRSA infections. Performed at [facility], [place] [address] [place], [place] [address]   Culture, blood (routine x 2)     Status: None (Preliminary result)   Collection Time: 09/25/18  7:32 AM  Result Value Ref Range Status   Specimen Description BLOOD RIGHT HAND  Final   Special Requests   Final    BOTTLES DRAWN AEROBIC ONLY Blood Culture adequate volume   Culture   Final    NO  GROWTH 3 DAYS Performed at [facility], [place] [address] [place], [place] [address]    Report Status PENDING  Incomplete  Culture, blood (routine x 2)     Status: None (Preliminary result)   Collection Time: 09/25/18  7:32 AM  Result Value Ref  Range Status   Specimen Description BLOOD RIGHT HAND  Final   Special Requests   Final    BOTTLES DRAWN AEROBIC ONLY Blood Culture adequate volume   Culture   Final    NO GROWTH 3 DAYS Performed at [facility], [address], [place], [place] [address]    Report Status PENDING  Incomplete  SARS Coronavirus 2 (CEPHEID - Performed in [place] hospital lab), Hosp Order     Status: None   Collection Time: 09/28/18  2:25 AM  Result Value Ref Range Status   SARS Coronavirus 2 NEGATIVE NEGATIVE Final    Comment: (NOTE) If result is NEGATIVE SARS-CoV-2 target nucleic acids are NOT DETECTED. The SARS-CoV-2 RNA is generally detectable in upper and lower  respiratory specimens during the acute phase of infection. The lowest  concentration of SARS-CoV-2 viral copies this assay can detect is 250  copies / mL. A negative result does not preclude SARS-CoV-2 infection  and should not be used as the sole basis for treatment or other  patient management decisions.  A negative result may occur with  improper specimen collection / handling, submission of specimen other  than nasopharyngeal swab, presence of viral mutation(s) within the  areas targeted by this assay, and inadequate number of viral copies  (<250 copies / mL). A negative result must be combined with clinical  observations, patient history, and epidemiological information. If result is POSITIVE SARS-CoV-2 target nucleic acids are DETECTED. The SARS-CoV-2 RNA is generally detectable in upper and lower  respiratory specimens dur ing the acute phase of infection.  Positive  results are indicative of active infection with SARS-CoV-2.  Clinical  correlation with patient history and  other diagnostic information is  necessary to determine patient infection status.  Positive results do  not rule out bacterial infection or co-infection with other viruses. If result is PRESUMPTIVE POSTIVE SARS-CoV-2 nucleic acids MAY BE PRESENT.   A presumptive positive result was obtained on the submitted specimen  and confirmed on repeat testing.  While 2019 novel coronavirus  (SARS-CoV-2) nucleic acids may be present in the submitted sample  additional confirmatory testing may be necessary for epidemiological  and / or clinical management purposes  to differentiate between  SARS-CoV-2 and other Sarbecovirus currently known to infect humans.  If clinically indicated additional testing with an alternate test  methodology [phone]) is advised. The SARS-CoV-2 RNA is generally  detectable in upper and lower respiratory sp ecimens during the acute  phase of infection. The expected result is Negative. Fact Sheet for Patients:  StrictlyIdeas.no Fact Sheet for Healthcare Providers: BankingDealers.co.za This test is not yet approved or cleared by the [place] FDA and has been authorized for detection and/or diagnosis of SARS-CoV-2 by FDA under an Emergency Use Authorization (EUA).  This EUA will remain in effect (meaning this test can be used) for the duration of the COVID-19 declaration under Section 564(b)(1) of the Act, 21 U.S.C. section 360bbb-3(b)(1), unless the authorization is terminated or revoked sooner. Performed at [facility], [place] [address], [place], [place] [address]          Radiology Studies: Dg Chest Portable 1 View  Result Date: 09/28/2018 CLINICAL DATA:  85 y/o  M; shortness of breath all day. EXAM: PORTABLE CHEST 1 VIEW COMPARISON:  09/25/2018 chest radiograph FINDINGS: Cardiomegaly and 3 lead pacemaker. Post median sternotomy and CABG. Increased diffuse reticular and patchy bibasilar opacities of the  lungs. Probable small bilateral pleural effusions. Right PICC  catheter tip projects over the lower SVC. No acute osseous abnormality identified. IMPRESSION: Increased diffuse reticular and patchy bibasilar opacities, likely pulmonary edema. Probable small bilateral pleural effusions. Electronically Signed   By: Kristine Garbe M.D.   On: 09/28/2018 02:35        Scheduled Meds: . amiodarone  100 mg Oral Daily  . apixaban  2.5 mg Oral BID  . B-complex with vitamin C  1 tablet Oral Daily  . carvedilol  6.25 mg Oral BID WC  . furosemide  80 mg Intravenous BID  . magnesium oxide  400 mg Oral Daily  . multivitamin  1 tablet Oral BID  . sodium chloride flush  3 mL Intravenous Q12H  . sodium zirconium cyclosilicate  10 g Oral Once  . vitamin C  1,000 mg Oral Daily   Continuous Infusions: . ampicillin (OMNIPEN) IV 2 g (09/28/18 0702)     LOS: 0 days     Vernell Leep, MD, FACP, [facility]. [facility]  To contact the attending provider between 7A-7P or the covering provider during after hours 7P-7A, please log into the web site [email or web address] and access using universal [place] password for that web site. If you do not have the password, please call the hospital operator.  09/28/2018, 10:44 AM

## 2018-09-28 NOTE — Progress Notes (Signed)
  Echocardiogram 2D Echocardiogram has been performed.  Marcus [name] 09/28/2018, 1:25 PM

## 2018-09-28 NOTE — Consult Note (Addendum)
Advanced Heart Failure Team Consult Note   Primary Physician: Patient, No Pcp Per PCP-Cardiologist:  No primary care provider on file.  Reason for Consultation: CHF, AKI  HPI:    Marcus [name] is seen today for evaluation of CHF at the request of Dr. Algis Liming.   85 yo has history of pacemaker for bradycardia, paroxysmal atrial fibrillation, CKD stage IV, CAD s/p CABG and PCIs, and ischemic cardiomyopathy EF 20-25%. Underwent CRT-D upgrade 10/08/13 due to 85% RV pacing.   Admitted 5/19-5/26/15 with volume overload. He was diuresed with milrinone and IV lasix and discharge weight was 153 lbs. He underwent upgrade of his ICD to CRT-D as well. He was able to wean off milrinone and did much better after CRT upgrade.  He was admitted in 3/16 with pacemaker pocket infection. CRT-D device was explanted and temporary-permanent pacemaker with placed. He later had a St Jude CRT-P system re-implanted. He was noted to be back in atrial fibrillation and had TEE-guided DCCV to NSR. TEE showed EF 25-30%.   RHC in 10/17 showed near-normal filling pressures and low but not markedly low cardiac output. Coreg was cut back to 6.25 mg bid. Echo in 10/17 showed improvement in LV function, EF 35-40%. Echo in 1/19 showed EF 35-40%, inferior/inferolateral akinesis.   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  He did not have any CNS symptoms or GI symptoms.  ID saw him, unsure how he developed Listeriosis.  Head CT had no acute findings, did not have MRI or LP.  He was sent home on ampicillin + Bactrim.  He has baseline CKD IV, and at home developed progressive dyspnea, weakness, and lethargy. EMS brought him back to the ER.  Oxygen saturation was low, 86% on room air.  CXR showed likely pulmonary edema.  COVID-19 was negative (also negative last admission).  He was A-V sequential paced as per normal.  Noted to be hyperkalemic with AKI on CKD stage IV.     Review of Systems: All systems reviewed and negative except as per HPI.   Home Medications Prior to Admission medications   Medication Sig Start Date End Date Taking? Authorizing Provider  acetaminophen (TYLENOL) 325 MG tablet Take 2 tablets (650 mg total) by mouth every 6 (six) hours as needed for mild pain or fever (or Fever >/= 101). 09/26/18   Pokhrel, Corrie Mckusick, MD  amiodarone (PACERONE) 200 MG tablet TAKE 1/2 TABLET (100MG     TOTAL) DAILY Patient taking differently: Take 100 mg by mouth daily.  08/27/18   [name], MD  ampicillin IVPB Inject 2 g into the vein every 8 (eight) hours for 17 days. As a continuous infusion Indication:  Listeria Bacteremia  Last Day of Therapy:  10/13/2018 Labs -Twice weekly:  CBC/D and BMP, Labs - Every other week:  ESR and CRP 09/26/18 10/13/18  Pokhrel, Laxman, MD  Ascorbic Acid (VITAMIN C) 1000 MG tablet Take 1,000 mg by mouth daily.    [provider]  b complex vitamins tablet Take 1 tablet by mouth daily.    [provider]  carvedilol (COREG) 6.25 MG tablet Take 1 tablet (6.25 mg total) by mouth 2 (two) times daily with a meal. 09/26/18   Pokhrel, Laxman, MD  Coenzyme Q10 (CO Q 10) 100 MG CAPS Take 100 mg by mouth 2 (two) times daily.     [provider]  ELIQUIS 2.5 MG TABS tablet TAKE 1 TABLET TWICE A  DAY Patient taking differently: Take 2.5 mg by mouth 2 (two) times daily.  08/27/18   [name], MD  Magnesium 250 MG TABS Take 250 mg by mouth daily.    [provider]  Multiple Vitamins-Minerals (PRESERVISION AREDS 2) CAPS Take 1 capsule by mouth 2 (two) times a day.    [provider]  NON FORMULARY Take 1 capsule by mouth See admin instructions. NAD+ (Nicotinamide Adenine Dinucleotide) capsules: Take 1 capsule by mouth once a day    [provider]  potassium chloride SA (K-DUR) 20 MEQ tablet Take 1 tablet (20 mEq total) by mouth daily. 09/26/18   Pokhrel, Corrie Mckusick, MD  Resveratrol 100 MG CAPS Take  100 mg by mouth daily.    [provider]  sulfamethoxazole-trimethoprim (BACTRIM DS) 800-160 MG tablet Take 2 tablets by mouth 2 (two) times daily for 18 days. 09/25/18 10/13/18  Pokhrel, Corrie Mckusick, MD  torsemide (DEMADEX) 20 MG tablet Take 2 tablets (40 mg total) by mouth daily. 08/22/18   [name], MD  zinc gluconate 50 MG tablet Take 50 mg by mouth daily.    [provider]    Past Medical History: Past Medical History:  Diagnosis Date  . Atrial flutter ([place])    a. Dx 07/2014, s/p TEE/DCCV.  . Cataracts, bilateral   . Chronic systolic CHF (congestive heart failure) ([place])    a. s/p CRT-D 09/2013. b. Device explant with temp perm then subsequent CRT-P 07/2014.  Marland Kitchen CKD (chronic kidney disease), stage III ([place])   . Coronary artery disease    a. s/p remote CABG x 2(VG->OM, LIMA->LAD;  b. Late 90's s/p PCI of RCA;  c. 12/09 Cath/PCI: LM 6m 70-80d (3.0x126mXience DES), LAD100p, LCX 80-90p (2.25x2847mxus Atom DES), RCA 100d (2.5x20m6mence DES), VG->OM 100, LIMA->LAD nl, EF 30-35%.  . DVMarland Kitchen (deep venous thrombosis) (HCC)[place]   Right knee  . Hard of hearing   . History of colon polyps   . History of gastric ulcer   . History of gout   . History of shingles   . Hyperlipidemia   . Hypertension   . Ischemic cardiomyopathy    a. EF 35-40% by echo 05/2012, b. EF 20-25%, akinesis and scarring of inferolateral, inferior and inferoseptal myocardium, mild AI, mod MR, LA mod dilated, RV mildly dialted and sys fx mildly reduced, RA mildly dilated (09/2013)  c. EF 35-40% by 2D in 07/2014, 25-30% by TEE.  . Macular degeneration, dry   . Pacemaker infection (HCC)[place]. PAF (paroxysmal atrial fibrillation) (HCC)[place]. Peripheral vascular disease (HCC)[place]. Presence of permanent cardiac pacemaker 07/23/2014   [id number] #[id number] cancer (HCC)[place]. Symptomatic bradycardia    a. 08/2003 s/p MDT Enpulse [id number] Dual chamber PPM ser # [id number]. Device  explant due to infection/temp perm insertion/PPM 07/2014.    Past Surgical History: Past Surgical History:  Procedure Laterality Date  . ANGIOPLASTY     stent placement  . APPENDECTOMY    . BI-VENTRICULAR PACEMAKER INSERTION [name] 07/23/2014   Procedure: BI-VENTRICULAR PACEMAKER INSERTION (CRT-P);  Surgeon: StevDeboraha Sprang;  [facility];  Service: Cardiovascular;  Laterality: [name];  . BI-VENTRICULAR PACEMAKER UPGRADE [name] 10/08/2013   Procedure: BI-VENTRICULAR PACEMAKER UPGRADE;  Surgeon: GregEvans Lance;  [facility];  Service: Cardiovascular;  Laterality: [name];  . CARDIAC CATHETERIZATION  most recent in 2009   several  .  CARDIAC CATHETERIZATION Right 07/19/2014   Procedure: TEMPORARY PACEMAKER;  Surgeon: [name], MD;  Location: [place];  Service: Cardiovascular;  Laterality: Right;  . CARDIAC CATHETERIZATION [name] 03/01/2016   Procedure: Right Heart Cath;  Surgeon: [name], MD;  Location: [place] CV LAB;  Service: Cardiovascular;  Laterality: [name];  . CARDIOVERSION  06/26/2012   Procedure: CARDIOVERSION;  Surgeon: Lelon Perla, MD;  Location: [facility] ENDOSCOPY;  Service: Cardiovascular;  Laterality: [name];  . CARDIOVERSION [name] 07/27/2014   Procedure: CARDIOVERSION;  Surgeon: [name], MD;  Location: [place];  Service: Cardiovascular;  Laterality: [name];  . CATARACT EXTRACTION W/PHACO Left 09/24/2016   Procedure: CATARACT EXTRACTION PHACO AND INTRAOCULAR LENS PLACEMENT ([place])  Left;  Surgeon: Leandrew Koyanagi, MD;  Location: [place];  Service: Ophthalmology;  Laterality: Left;  . CATARACT EXTRACTION W/PHACO Right 10/17/2016   Procedure: CATARACT EXTRACTION PHACO AND INTRAOCULAR LENS PLACEMENT ([place])  Right;  Surgeon: Leandrew Koyanagi, MD;  Location: [place];  Service: Ophthalmology;  Laterality: Right;  prefers mid to late morning arrival  . COLONOSCOPY    . CORONARY ANGIOPLASTY     couple   . CORONARY ARTERY BYPASS GRAFT   1990   left main  . DOPPLER ECHOCARDIOGRAPHY  06/24/2002   EF 60-65%  . EMBOLECTOMY Right 07/28/2013   Procedure: Right Popliteal and Tibial Embolectomy;  Surgeon: Angelia Mould, MD;  Location: [place];  Service: Vascular;  Laterality: Right;  . INSERT / REPLACE / REMOVE PACEMAKER     1st pacemaker placed 10+yrs ago  . PACEMAKER LEAD REMOVAL Right 07/19/2014   Procedure: PACEMAKER LEAD REMOVAL/EXTRACTION;  Surgeon: [name], MD;  Location: [place];  Service: Cardiovascular;  Laterality: Right;  DR. Lucianne Lei TRIGT TO BACK UP CASE  . PERMANENT PACEMAKER GENERATOR CHANGE [name] 08/27/2012   Procedure: PERMANENT PACEMAKER GENERATOR CHANGE;  Surgeon: [name], MD;  Location: [facility] CATH LAB;  Service: Cardiovascular;  Laterality: [name];  . TEE WITHOUT CARDIOVERSION  06/26/2012   Procedure: TRANSESOPHAGEAL ECHOCARDIOGRAM (TEE);  Surgeon: Lelon Perla, MD;  Location: [facility] ENDOSCOPY;  Service: Cardiovascular;  Laterality: [name];  . TEE WITHOUT CARDIOVERSION [name] 07/27/2014   Procedure: TRANSESOPHAGEAL ECHOCARDIOGRAM (TEE);  Surgeon: [name], MD;  Location[facility] ENDOSCOPY;  Service: Cardiovascular;  Laterality: [name];    Family History: Family History  Problem Relation Age of Onset  . Hypertension Mother   . Colon cancer Mother   . Cancer Mother        Colon  . Alcohol abuse Father     Social History: Social History   Socioeconomic History  . Marital status: Married    Spouse name: Not on file  . Number of children: 0  . Years of education: Not on file  . Highest education level: Not on file  Occupational History  . Occupation: retired  Scientific laboratory technician  . Financial resource strain: Not on file  . Food insecurity:    Worry: Not on file    Inability: Not on file  . Transportation needs:    Medical: Not on file    Non-medical: Not on file  Tobacco Use  . Smoking status: Former Smoker    Last attempt to quit: 02/10/1971    Years since quitting: 47.6  . Smokeless tobacco: Never Used  .  Tobacco comment: quit smoking 69yr ago  Substance and Sexual Activity  . Alcohol use: No    Comment: nothing in 760yr  . Drug use: No  .  Sexual activity: Never  Lifestyle  . Physical activity:    Days per week: Not on file    Minutes per session: Not on file  . Stress: Not on file  Relationships  . Social connections:    Talks on phone: Not on file    Gets together: Not on file    Attends religious service: Not on file    Active member of club or organization: Not on file    Attends meetings of clubs or organizations: Not on file    Relationship status: Not on file  Other Topics Concern  . Not on file  Social History Narrative   Lives in [place] with wife.  Retired.    Allergies:  No Known Allergies  Objective:    Vital Signs:   Temp:  [97.6 F (36.4 C)-98.3 F (36.8 C)] 97.6 F (36.4 C) (05/10 0757) Pulse Rate:  [47-85] 70 (05/10 0757) Resp:  [18-32] 18 (05/10 0757) BP: (95-112)/(67-81) 104/70 (05/10 0757) SpO2:  [86 %-100 %] 92 % (05/10 0757) Weight:  [84.3 kg] 84.3 kg (05/10 0509)    Weight change: Filed Weights   09/28/18 0509  Weight: 84.3 kg    Intake/Output:   Intake/Output Summary (Last 24 hours) at 09/28/2018 1018 Last data filed at 09/28/2018 0915 [place] per 24 hour  Intake 120 ml  Output -  Net 120 ml      Physical Exam    General:  NAD. HEENT: normal Neck: supple. JVP 12+ cm. Carotids 2+ bilat; no bruits. No lymphadenopathy or thyromegaly appreciated. Cor: PMI nondisplaced. Regular rate & rhythm. No rubs, gallops or murmurs. Lungs: Bilateral rhonchi and crackles.  Abdomen: soft, nontender, nondistended. No hepatosplenomegaly. No bruits or masses. Good bowel sounds. Extremities: no cyanosis, clubbing, rash. 1+ ankle edema Neuro: alert & orientedx3, cranial nerves grossly intact. moves all 4 extremities w/o difficulty. Affect pleasant   Telemetry   A-V sequential pacing (personally reviewed)  EKG    A-V sequential pacing (personally  reviewed)  Labs   Basic Metabolic Panel: Recent Labs  Lab 09/23/18 0209 09/24/18 0229 09/25/18 0552 09/26/18 0945 09/28/18 0210 09/28/18 0829  NA 138 140 136 135 133* 134*  K 4.1 3.7 3.4* 3.7 6.2* 6.2*  CL 102 103 97* 100 95* 98  CO2 17* '23 23 23 ' 16* 19*  GLUCOSE 119* 122* 102* 125* 130* 122*  BUN 91* 85* 80* 72* 83* 86*  CREATININE 3.66* 3.32* 2.99* 3.24* 4.20* 4.24*  CALCIUM 9.0 8.7* 8.4* 8.4* 9.0 9.0  PHOS 4.0  --   --   --   --   --     Liver Function Tests: Recent Labs  Lab 09/22/18 1933 09/23/18 0209  AST 20  --   ALT 13  --   ALKPHOS 66  --   BILITOT 1.1  --   PROT 7.9  --   ALBUMIN 3.5 3.2*   No results for input(s): LIPASE, AMYLASE in the last 168 hours. No results for input(s): AMMONIA in the last 168 hours.  CBC: Recent Labs  Lab 09/24/18 0229 09/25/18 0552 09/26/18 0945 09/28/18 0210 09/28/18 0829  WBC 7.0 6.0 5.7 7.6 11.6*  NEUTROABS 5.7 4.8 4.4  --   --   HGB 9.9* 9.4* 8.8* 9.6* 9.0*  HCT 31.3* 29.4* 27.6* 31.6* 28.5*  MCV 91.8 90.5 90.2 95.5 91.6  PLT 181 172 195 260 244    Cardiac Enzymes: Recent Labs  Lab 09/23/18 0209 09/23/18 0711 09/23/18 1708 09/23/18 1930 09/24/18 0229  TROPONINI 0.10* 0.12* 0.16* 0.16* 0.15*    BNP: BNP (last 3 results) Recent Labs    06/25/18 1209 09/22/18 1857 09/28/18 0210  BNP 800.3* 1,259.5* 1,315.0*    ProBNP (last 3 results) No results for input(s): PROBNP in the last 8760 hours.   CBG: No results for input(s): GLUCAP in the last 168 hours.  Coagulation Studies: No results for input(s): LABPROT, INR in the last 72 hours.   Imaging   Dg Chest Portable 1 View  Result Date: 09/28/2018 CLINICAL DATA:  85 y/o  M; shortness of breath all day. EXAM: PORTABLE CHEST 1 VIEW COMPARISON:  09/25/2018 chest radiograph FINDINGS: Cardiomegaly and 3 lead pacemaker. Post median sternotomy and CABG. Increased diffuse reticular and patchy bibasilar opacities of the lungs. Probable small bilateral  pleural effusions. Right PICC catheter tip projects over the lower SVC. No acute osseous abnormality identified. IMPRESSION: Increased diffuse reticular and patchy bibasilar opacities, likely pulmonary edema. Probable small bilateral pleural effusions. Electronically Signed   By: Kristine Garbe M.D.   On: 09/28/2018 02:35      Medications:     Current Medications: . amiodarone  100 mg Oral Daily  . apixaban  2.5 mg Oral BID  . B-complex with vitamin C  1 tablet Oral Daily  . carvedilol  6.25 mg Oral BID WC  . furosemide  80 mg Intravenous Q12H  . furosemide  80 mg Intravenous BID  . magnesium oxide  400 mg Oral Daily  . multivitamin  1 tablet Oral BID  . sodium chloride flush  3 mL Intravenous Q12H  . sodium zirconium cyclosilicate  10 g Oral Once  . vitamin C  1,000 mg Oral Daily     Infusions: . ampicillin (OMNIPEN) IV 2 g (09/28/18 0702)        Assessment/Plan   1. 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  He sas been started on ampicillin + bactrim with plan for 3 week treatment. He has denied abdominal discomfort/diarrhea, has not been eating out.  He was neurologically clear at discharge without localizing CNS symptoms/signs.  ID thought we could hold off on further CNS imaging (MRI, LP) with no signs of meningoencephalitis. Suspect Bactrim may have triggered AKI/hyperkalemia.  - Continue ampicillin but stop Bactrim.  May need ID input again regarding antibiotic regimen.  2. Acute on chronic systolic EXB:[id number] cardiomyopathy. Echo in 1/19 showed EF 35-40% (stable from most recent prior). Has St Jude CRT-P device. On device check on 08/06/18, he was appropriately BiV pacing with extremely rare AF, thoracic impedance was low. I recently increased his torsemide to 40 mg daily for volume overload.  This admission, he looks volume overloaded, likely in setting of AKI on CKD IV with Bactrim use.  BNP elevated, pulmonary edema on CXR.  -Lasix 80 mg IV bid x 2  more doses today and follow response.  Will need to decide on further diuretic dosing tomorrow morning.  - Can continue home Coreg 6.25 mg bid, BP stable.  - Off spiro with increased creatinine and potassium. - Not on ACEI/ARB/ARNI with CKD. 3. Atrial fibrillation: Paroxysmal. <1% atrial fibrillation on recent device check. He is currently a-paced.  - Continue amiodarone 100 mg daily. - He is on Eliquis 2.5 mg bid (age, elevated creatinine).  4. CAD:H/o CABG.  No chest pain. -Refuses statin. - He is not on ASA given use of Eliquis and stable CAD.  5. Sick sinus syndrome: s/p CRT-P.No change. 6. AKI on CKD, stage WN:[id number] up to 4.2  with hyperkalemia in setting of Bactrim use.  He also looks volume overloaded.  - Stop Bactrim.  - Getting Lokelma per [facility]. Repeat BMET in pm.  - Lasix 80 mg IV bid x 2 doses.  7. Deconditioning: PT/OT.   Length of Stay: 0  [name], MD  09/28/2018, 10:18 AM  Advanced Heart Failure Team Pager [phone] (M-F; 7a - 4p)  Please contact [facility] for night-coverage after hours (4p -7a ) and weekends on [email or web address]

## 2018-09-28 NOTE — H&P (Signed)
History and Physical   Marcus [name] SWN:[id number] DOB: Oct 23, 1928 DOA: 09/28/2018  PCP: Patient, No Pcp Per  Sources of history include patient provided history, as well as discussion with the spouse over the telephone, EMR review, discussion with the emergency medicine team.  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  The patient reports that since arriving home he has not been doing well.  The patient at baseline is ambulatory without assistance, is independent in his ADLs.  On the morning of Sep 27, 2018 he was able to stand and perform his ADLs without assistance.  However, throughout the day he became progressively weaker to the point where his spouse called his son who lives out of town to come help get him out of the chair.  The patient reports worsening shortness of breath particularly with exertion.  Associated symptoms include nausea, vomiting, lower extremity edema.  He denies chest pain, fever, chills, orthopnea.  He reports taking torsemide as prescribed.  Notable events include recent admission from May 4 through Sep 26, 2018.  He was discharged on a home antimicrobial regimen including ampicillin and Bactrim.  Patient transported via EMS to the emergency room.  The patient is a retired Agricultural engineer, former smoker, served in [employer] but does not get his medical care through the [place] system.  He is followed by the heart failure clinic.  In the past he has required milrinone support to allow for effective diuresis per heart failure clinic notes back in 2015.  ED Course: Vital signs  in the emergency department remarkable for O2 sat of 86%, respiratory rate peaked at 31, heart rate normal.  Systolic blood pressure ranging from 98-111.  Work-up remarkable for CBC with normocytic anemia with a hemoglobin of 9.6.  BMP with potassium of 6.2 without hemolysis, BUN/creatinine of 83 and 4.2 respectively.  Anion gap elevated at 22.  Bicarb of 16.  BNP of 1315 -which is increased from prior.  Chest x-ray with increased opacities likely pulmonary edema.  EKG reveals paced rhythm.  COVID-19 testing negative.  The patient was given 80 mg of IV furosemide.  Hospital medicine consulted for further management.  Review of Systems: A complete ROS was obtained; pertinent positives negatives are denoted in the HPI. Otherwise, all systems are negative.   Past Medical History:  Diagnosis Date   Atrial flutter ([place])    a. Dx 07/2014, s/p TEE/DCCV.   Cataracts, bilateral    Chronic systolic CHF (congestive heart failure) ([place])    a. s/p CRT-D 09/2013. b. Device explant with temp perm then subsequent CRT-P 07/2014.   CKD (chronic kidney disease), stage III (HCC)    Coronary artery disease    a. s/p remote CABG x 2(VG->OM, LIMA->LAD;  b. Late 90's s/p PCI of RCA;  c. 12/09 Cath/PCI: LM 54m, 70-80d (3.0x59mm Xience DES), LAD100p, LCX 80-90p (2.25x71mmTaxus Atom DES), RCA 100d (2.5x48mm Xience DES), VG->OM 100, LIMA->LAD nl, EF 30-35%.   DVT (deep venous thrombosis) ([place]) 2017   Right knee   Hard of hearing    History of colon polyps    History of gastric ulcer    History of gout  History of shingles    Hyperlipidemia    Hypertension    Ischemic cardiomyopathy    a. EF 35-40% by echo 05/2012, b. EF 20-25%, akinesis and scarring of inferolateral, inferior and inferoseptal myocardium, mild AI, mod MR, LA mod dilated, RV mildly dialted and sys fx mildly reduced, RA mildly dilated (09/2013)  c. EF 35-40% by 2D in 07/2014, 25-30% by TEE.   Macular degeneration, dry    Pacemaker infection  ([place])    PAF (paroxysmal atrial fibrillation) ([place])    Peripheral vascular disease ([place])    Presence of permanent cardiac pacemaker 07/23/2014   [facility] #[id number], Serial [id number]   Prostate cancer [facility])    Symptomatic bradycardia    a. 08/2003 s/p MDT Enpulse E2DR01 Dual chamber PPM ser # [id number] H. b. Device explant due to infection/temp perm insertion/PPM 07/2014.   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 file   Food insecurity:    Worry: Not on file    Inability: Not on file   Transportation needs:    Medical: Not on file    Non-medical: Not on file  Tobacco Use   Smoking status: Former Smoker    Last attempt to quit: 02/10/1971    Years since quitting: 47.6   Smokeless tobacco: Never Used   Tobacco comment: quit smoking 21yrs ago  Substance and Sexual Activity   Alcohol use: No    Comment: nothing in 57yrs    Drug use: No   Sexual activity: Never  Lifestyle   Physical activity:    Days per week: Not on file    Minutes per session: Not on file   Stress: Not on file  Relationships   Social connections:    Talks on phone: Not on file    Gets together: Not on file    Attends religious service: Not on file    Active member of club or organization: Not on file    Attends meetings of clubs or organizations: Not on file    Relationship status: Not on file   Intimate partner violence:    Fear of current or ex partner: Not on file    Emotionally abused: Not on file    Physically abused: Not on file    Forced sexual activity: Not on file  Other Topics Concern   Not on file  Social History Narrative   Lives in [place] with wife.  Retired.   Family History  Problem Relation Age of Onset    Hypertension Mother    Colon cancer Mother    Cancer Mother        Colon   Alcohol abuse Father    Father died of alcohol use.  Physical Exam: Vitals:   09/28/18 0215 09/28/18 0230 09/28/18 0245 09/28/18 0308  BP: 98/67 112/81 101/71 105/69  Pulse: 72 78 85 75  Resp: (!) 24 (!) 31 20 (!) 22  Temp:      TempSrc:      SpO2: 97% (!) 86% 96% 100%   General: Elderly thin white and frail man, no acute distress ENT: Grossly normal hearing, no scleral icterus Cardiovascular: RRR. No M/R/G. Trace LE edema.  Respiratory: Diffuse wheezing present.  Initially was on a nonrebreather but while in the room the nurse placed/transitioned the patient to 4 L nasal cannula O2 maintaining his O2 sats above 92%.  Well-healed sternotomy scar. Abdomen: Soft, non-tender.  Skin: No rash or induration seen on limited exam.  Pallor. Musculoskeletal: Grossly normal tone BUE/BLE.  Globally reduced muscle mass. Psychiatric: Grossly normal mood and affect. Neurologic: Moves all extremities in coordinated fashion.  Is alert and answering questions appropriately.  Oriented to hospital, describes years 2019. IV access: RUE PICC in place  I have personally reviewed the following labs, culture data, and imaging studies.  Assessment/Plan:  #Acute hypoxic respiratory failure due to acute decompensated systolic / reduced EF heart failure Course: After being discharged on 09/26/2018, the patient has experienced progressive SOB, functional decline, and weakness. On this admission, BNP elevated above baseline, diffuse wheezes, O2 sat of 86%, increased RR, CXR c/w fluid overload. A/P: Possible contributors to decompensation include recent hospitalization with infection (although did not appear to receive large amounts of fluids), acute kidney injury with reduced effectiveness of his PO torsemide, vs other.  Will assess response to furosemide 80 mg IV which he received at 0343 on 09/28/2018 to inform optimal diuresis plan.   Repeat labs ordered for 0900.  Continue home BB. Consider cardiology team consultation in AM given the patient follows with heart failure clinic in OP setting.  Continue to provide O2 support while volume optimizing. Low sodium diet with fluid restrictions. Defer to day team as to whether updated TTE would be helpful. Foley catheter for close monitoring of urine output in setting of nocturnal diuresis and to exclude a urinary retention component of AKI / volume overload.  #Other problems: -AKI on CKD: bCR ~3.-3.5, on admission BUN/CR of 83/4.2. Suspect cardio-renal syndrome.   -Hyperkalemia: on admission K of 6.2; will provide one dose of Kayexalate, anticipate improvement with IV loop diuretic, monitor on telemetry -Atrial fibrillation, hx of paroxysmal: continue BB and home apixaban, cardiology team evaluated the patient at his most recent hospitalization and recommended continuation, continue home amiodarone -Listeria bacteremia: will HOLD bactrim in setting of AKI and continue with sole ampicillin for now, consider ID consultation for input  DVT prophylaxis: On Eliquis Code Status: DNR, but voices he would want to be intubated at this time; I discussed with the spouse as well [name] who confirms such Disposition Plan: Inpatient for now, anticipate SNF need Consults called: palliative care consulted by emergency medicine team, consider cardiology and infectious disease consults in AM Admission status: Admit to hospital medicine, telemetry level of care   Cheri Rous, MD [facility] Page:[phone]  If 7PM-7AM, please contact night-coverage [email or web address] Password [email or web address]  This document was created using the aid of voice recognition / dication software.

## 2018-09-28 NOTE — ED Notes (Signed)
Wife, pat coluccio, would like an update when possible at [phone]

## 2018-09-28 NOTE — Consult Note (Signed)
Consultation Note Date: 09/28/2018   Patient Name: Marcus [name]   DOB: 11-15-1928  MRN: [id number]  Age / Sex: 85 y.o., male  PCP: Patient, No Pcp Per Referring Physician: Modena Jansky, MD  Reason for Consultation: Establishing goals of care and Psychosocial/spiritual support  HPI/Patient Profile: 85 y.o. male   admitted on 09/28/2018 with past medical of   history of pacemaker for bradycardia, paroxysmal atrial fibrillation, CKD stage IV, CAD s/p CABG and PCIs, and ischemic cardiomyopathy EF 20-25%.   Admitted 5/19-5/26/15 with volume overload. He was diuresed with milrinone and IV lasix and discharge weight was 153 lbs. He underwent upgrade of his ICD to CRT-D as well. He was able to wean off milrinone and did much better after CRT upgrade.   He was admitted earlier this month with fever and found to have Listeria bacteremia, was dc home with IV antibiotics.  He was home less than 2 days when he was again admitted with dyspnea, weakness and lethargy.  CXR showed likely pulmonary edema.  COVID-19 was negative (also negative last admission).    Patient and his family face treatment option decisions, advanced directive decisions and anticipatory care needs.  Clinical Assessment and Goals of Care:  This NP Wadie Lessen reviewed medical records, received report from team, assessed the patient and then meet at the patient's bedside and spoke by phone with his wife  to discuss diagnosis, prognosis, GOC, EOL wishes disposition and options.  Concept of Hospice and Palliative Care were discussed  A detailed discussion was had today regarding advanced directives.  Concepts specific to code status, artifical feeding and hydration, continued IV antibiotics and rehospitalization was had.  The difference between a aggressive medical intervention path  and a palliative comfort care path for this patient at  this time was had.  Values and goals of care important to patient and family were attempted to be elicited.  Patient's wife verbalize concern over his increasing care needs and her inability to care for him at home.  We discussed the importance of advanced care planning as it relates to [place], artificial feeding and hydration, IV medications and rehospitalization.  Discussed hospice benefit.`  Questions and concerns addressed.   Family encouraged to call with questions or concerns.    Will continue to support this patient and his wife as they discuss ongoing goals of care and anticipatory care needs.  PMT will continue to support holistically.       HCPOA/wife    SUMMARY OF RECOMMENDATIONS    Code Status/Advance Care Planning:  Limited code   No compressions, shock or intubation   Palliative Prophylaxis:   Aspiration, Bowel Regimen, Delirium Protocol and Oral Care  Additional Recommendations (Limitations, Scope, Preferences):  Full Scope Treatment-patient is hopeful for improvement  Psycho-social/Spiritual:   Desire for further Chaplaincy support:no  Additional Recommendations: Education on Hospice   Created space and opportunity for patient and his wife to explore thoughts and feelings regarding current medical situation, goals of care and anticipatory care needs.  Prognosis:   < 6 months  Discharge Planning: To Be Determined      Primary Diagnoses: Present on Admission: . Hypoxia   I have reviewed the medical record, interviewed the patient and family, and examined the patient. The following aspects are pertinent.  Past Medical History:  Diagnosis Date  . Atrial flutter ([place])    a. Dx 07/2014, s/p TEE/DCCV.  . Cataracts, bilateral   . Chronic systolic CHF (congestive heart failure) ([place])    a. s/p CRT-D 09/2013. b. Device explant with temp perm then subsequent CRT-P 07/2014.  Marland Kitchen CKD (chronic kidney disease), stage III ([place])   . Coronary artery  disease    a. s/p remote CABG x 2(VG->OM, LIMA->LAD;  b. Late 90's s/p PCI of RCA;  c. 12/09 Cath/PCI: LM 70m 70-80d (3.0x133mXience DES), LAD100p, LCX 80-90p (2.25x2810mxus Atom DES), RCA 100d (2.5x20m35mence DES), VG->OM 100, LIMA->LAD nl, EF 30-35%.  . DVMarland Kitchen (deep venous thrombosis) (HCC)[place]17   Right knee  . Hard of hearing   . History of colon polyps   . History of gastric ulcer   . History of gout   . History of shingles   . Hyperlipidemia   . Hypertension   . Ischemic cardiomyopathy    a. EF 35-40% by echo 05/2012, b. EF 20-25%, akinesis and scarring of inferolateral, inferior and inferoseptal myocardium, mild AI, mod MR, LA mod dilated, RV mildly dialted and sys fx mildly reduced, RA mildly dilated (09/2013)  c. EF 35-40% by 2D in 07/2014, 25-30% by TEE.  . Macular degeneration, dry   . Pacemaker infection (HCC)[place]. PAF (paroxysmal atrial fibrillation) (HCC)[place]. Peripheral vascular disease (HCC)[place]. Presence of permanent cardiac pac[facility]23/2014   [id number] cancer (HCC)[place]. Symptomatic bradycardia    a. 08/2003 s/p MDT Enpulse E2DR01 Dual chamber PPM ser # [id number]. Device explant due to infection/temp perm insertion/PPM 07/2014.   Social History   Socioeconomic History  . Marital status: Married    Spouse name: Not on file  . Number of children: 0  . Years of education: Not on file  . Highest education level: Not on file  Occupational History  . Occupation: retired  SociScientific laboratory technicianFinancial resource strain: Not on file  . Food insecurity:    Worry: Not on file    Inability: Not on file  . Transportation needs:    Medical: Not on file    Non-medical: Not on file  Tobacco Use  . Smoking status: Former Smoker    Last attempt to quit: 02/10/1971    Years since quitting: 47.6  . Smokeless tobacco: Never Used  . Tobacco comment: quit smoking 64yr62yr  Substance and Sexual Activity  . Alcohol use: No     Comment: nothing in 31yrs 70yrDrug use: No  . Sexual activity: Never  Lifestyle  . Physical activity:    Days per week: Not on file    Minutes per session: Not on file  . Stress: Not on file  Relationships  . Social connections:    Talks on phone: Not on file    Gets together: Not on file    Attends religious service: Not on file    Active member of club or organization: Not on file    Attends meetings of clubs or organizations: Not on file    Relationship status: Not on  file  Other Topics Concern  . Not on file  Social History Narrative   Lives in [place] with wife.  Retired.   Family History  Problem Relation Age of Onset  . Hypertension Mother   . Colon cancer Mother   . Cancer Mother        Colon  . Alcohol abuse Father    Scheduled Meds: . amiodarone  100 mg Oral Daily  . apixaban  2.5 mg Oral BID  . B-complex with vitamin C  1 tablet Oral Daily  . carvedilol  6.25 mg Oral BID WC  . furosemide  80 mg Intravenous BID  . magnesium oxide  400 mg Oral Daily  . multivitamin  1 tablet Oral BID  . sodium chloride flush  3 mL Intravenous Q12H  . sodium zirconium cyclosilicate  10 g Oral Once  . vitamin C  1,000 mg Oral Daily   Continuous Infusions: . ampicillin (OMNIPEN) IV 2 g (09/28/18 0702)   PRN Meds:.albuterol Medications Prior to Admission:  Prior to Admission medications   Medication Sig Start Date End Date Taking? Authorizing Provider  acetaminophen (TYLENOL) 325 MG tablet Take 2 tablets (650 mg total) by mouth every 6 (six) hours as needed for mild pain or fever (or Fever >/= 101). 09/26/18   Pokhrel, Corrie Mckusick, MD  amiodarone (PACERONE) 200 MG tablet TAKE 1/2 TABLET (100MG     TOTAL) DAILY Patient taking differently: Take 100 mg by mouth daily.  08/27/18   [name], MD  ampicillin IVPB Inject 2 g into the vein every 8 (eight) hours for 17 days. As a continuous infusion Indication:  Listeria Bacteremia  Last Day of Therapy:  10/13/2018 Labs -Twice weekly:  CBC/D  and BMP, Labs - Every other week:  ESR and CRP 09/26/18 10/13/18  Pokhrel, Laxman, MD  Ascorbic Acid (VITAMIN C) 1000 MG tablet Take 1,000 mg by mouth daily.    [provider]  b complex vitamins tablet Take 1 tablet by mouth daily.    [provider]  carvedilol (COREG) 6.25 MG tablet Take 1 tablet (6.25 mg total) by mouth 2 (two) times daily with a meal. 09/26/18   Pokhrel, Laxman, MD  Coenzyme Q10 (CO Q 10) 100 MG CAPS Take 100 mg by mouth 2 (two) times daily.     [provider]  ELIQUIS 2.5 MG TABS tablet TAKE 1 TABLET TWICE A DAY Patient taking differently: Take 2.5 mg by mouth 2 (two) times daily.  08/27/18   [name], MD  Magnesium 250 MG TABS Take 250 mg by mouth daily.    [provider]  Multiple Vitamins-Minerals (PRESERVISION AREDS 2) CAPS Take 1 capsule by mouth 2 (two) times a day.    [provider]  NON FORMULARY Take 1 capsule by mouth See admin instructions. NAD+ (Nicotinamide Adenine Dinucleotide) capsules: Take 1 capsule by mouth once a day    [provider]  potassium chloride SA (K-DUR) 20 MEQ tablet Take 1 tablet (20 mEq total) by mouth daily. 09/26/18   Pokhrel, Corrie Mckusick, MD  Resveratrol 100 MG CAPS Take 100 mg by mouth daily.    [provider]  sulfamethoxazole-trimethoprim (BACTRIM DS) 800-160 MG tablet Take 2 tablets by mouth 2 (two) times daily for 18 days. 09/25/18 10/13/18  Pokhrel, Corrie Mckusick, MD  torsemide (DEMADEX) 20 MG tablet Take 2 tablets (40 mg total) by mouth daily. 08/22/18   [name], MD  zinc gluconate 50 MG tablet Take 50 mg  by mouth daily.    [provider]   No Known Allergies Review of Systems  Physical Exam  Vital Signs: BP 104/70 (BP Location: Left Arm)   Pulse 70   Temp 97.6 F (36.4 C)   Resp 18   Ht _0  (1.753 m)   Wt 84.3 kg   SpO2 92%   BMI 27.44 kg/m  Pain Scale: 0-10   Pain Score: 0-No pain   SpO2: SpO2: 92 % O2 Device:SpO2: 92 % O2 Flow Rate: .O2  Flow Rate (L/min): 4 L/min  IO: Intake/output summary:   Intake/Output Summary (Last 24 hours) at 09/28/2018 1037 Last data filed at 09/28/2018 0915 Gross per 24 hour  Intake 120 ml  Output -  Net 120 ml    LBM:   Baseline Weight: Weight: 84.3 kg Most recent weight: Weight: 84.3 kg     Palliative Assessment/Data:30% at best   Discussed with Dr Algis Liming  Time In: 0915 Time Out: 1030 Time Total: 75 minutes Greater than 50%  of this time was spent counseling and coordinating care related to the above assessment and plan.  Signed by: Wadie Lessen, NP   Please contact Palliative Medicine Team phone at [phone] for questions and concerns.  For individual provider: See Shea Evans

## 2018-09-28 NOTE — ED Triage Notes (Signed)
Pt reports shob all day. EmS was called, pt O2 in the 85. 100% nonrebreather. CHf 97*f, 122/79. From home. Pacemaker. On lasic.

## 2018-09-29 DIAGNOSIS — R7881 Bacteremia: Secondary | ICD-10-CM

## 2018-09-29 DIAGNOSIS — I5023 Acute on chronic systolic (congestive) heart failure: Secondary | ICD-10-CM

## 2018-09-29 DIAGNOSIS — N179 Acute kidney failure, unspecified: Secondary | ICD-10-CM

## 2018-09-29 DIAGNOSIS — I2510 Atherosclerotic heart disease of native coronary artery without angina pectoris: Secondary | ICD-10-CM

## 2018-09-29 DIAGNOSIS — D72829 Elevated white blood cell count, unspecified: Secondary | ICD-10-CM

## 2018-09-29 DIAGNOSIS — A329 Listeriosis, unspecified: Secondary | ICD-10-CM

## 2018-09-29 LAB — URINALYSIS, ROUTINE W REFLEX MICROSCOPIC
Bilirubin Urine: NEGATIVE
Glucose, UA: NEGATIVE mg/dL
Hgb urine dipstick: NEGATIVE
Ketones, ur: NEGATIVE mg/dL
Leukocytes,Ua: NEGATIVE
Nitrite: NEGATIVE
Protein, ur: NEGATIVE mg/dL
Specific Gravity, Urine: 1.013 (ref 1.005–1.030)
pH: 5 (ref 5.0–8.0)

## 2018-09-29 LAB — BASIC METABOLIC PANEL
Anion gap: 20 — ABNORMAL HIGH (ref 5–15)
BUN: 95 mg/dL — ABNORMAL HIGH (ref 8–23)
CO2: 21 mmol/L — ABNORMAL LOW (ref 22–32)
Calcium: 8.8 mg/dL — ABNORMAL LOW (ref 8.9–10.3)
Chloride: 94 mmol/L — ABNORMAL LOW (ref 98–111)
Creatinine, Ser: 4.81 mg/dL — ABNORMAL HIGH (ref 0.61–1.24)
GFR calc Af Amer: 12 mL/min — ABNORMAL LOW (ref >60)
GFR calc non Af Amer: 10 mL/min — ABNORMAL LOW (ref >60)
Glucose, Bld: 95 mg/dL (ref 70–99)
Potassium: 5.6 mmol/L — ABNORMAL HIGH (ref 3.5–5.1)
Sodium: 135 mmol/L (ref 135–145)

## 2018-09-29 LAB — MAGNESIUM: Magnesium: 2.7 mg/dL — ABNORMAL HIGH (ref 1.7–2.4)

## 2018-09-29 LAB — GLUCOSE, CAPILLARY
Glucose-Capillary: 93 mg/dL (ref 70–99)
Glucose-Capillary: 94 mg/dL (ref 70–99)

## 2018-09-29 LAB — COOXEMETRY PANEL
Carboxyhemoglobin: 1.1 % (ref 0.5–1.5)
Methemoglobin: 1.8 % — ABNORMAL HIGH (ref 0.0–1.5)
O2 Saturation: 38.2 %
Total hemoglobin: 8.5 g/dL — ABNORMAL LOW (ref 12.0–16.0)

## 2018-09-29 MED ORDER — MILRINONE LACTATE IN DEXTROSE 20-5 MG/100ML-% IV SOLN
0.2500 ug/kg/min | INTRAVENOUS | Status: DC
  Administered 2018-09-29 – 2018-10-01 (×3): 0.25 ug/kg/min via INTRAVENOUS
  Filled 2018-09-29 (×3): qty 100

## 2018-09-29 MED ORDER — FUROSEMIDE 10 MG/ML IJ SOLN: 120.0000 mg | Freq: Two times a day (BID) | INTRAVENOUS | Status: DC

## 2018-09-29 MED ORDER — SODIUM ZIRCONIUM CYCLOSILICATE 10 G PO PACK
10.0000 g | PACK | Freq: Two times a day (BID) | ORAL | Status: DC
  Administered 2018-09-29 (×2): 10 g via ORAL
  Filled 2018-09-29 (×4): qty 1

## 2018-09-29 NOTE — Progress Notes (Signed)
Patient ID: Marcus [name], male   DOB: [birth year], 85 y.o.   MRN: [id number]  This NP visited patient at the bedside as a follow up to  yesterday's [place].  Patient is disoriented to time and place but with minimal intervention I was able to reorient him to the hospital and his situation.  Patient appears weak and is seriously ill with multiple comorbidities.  Continues on antibiotics for Listeria bacteremia, creatinine today is elevated at 4.81, potassium is 5.6,; plan is to transfer patient to progressive care unit for more intensive interventions.  Spoke with wife by phone for continued conversation regarding diagnosis, prognosis, GOCs, EOL wishes disposition, options and anticipatory care needs.  Facetimed wife and husband for a few minute face to face conversation.     Wife and I had conversation regarding the seriousness of the current medical situation and the  importance of continued conversation with her husband  and the medical providers regarding overall plan of care and treatment options,  ensuring decisions are within the context of the patients values and GOCs.  Both patient and wife verbalize their concern for greater and greater care needs at home, and before this hospitalization,  and a very clear decision that they would not want to be placed in a skilled nursing facility ever in their life.  I stressed the importance of clarifying goals of care as it will affect medical outcomes.  Questions and concerns addressed   Discussed with Dr Algis Liming  Total time spent on the unit was 45 minutes    PMT will continue to support holistically  Greater than 50% of the time was spent in counseling and coordination of care  Wadie Lessen NP  Palliative Medicine Team Team Phone # [phone] Pager [phone]

## 2018-09-29 NOTE — Progress Notes (Signed)
[facility] for Infectious Disease   Reason for visit: Follow up on listeria  Interval History: no new complaints, WBC with mild elevation, afebrile.  Feels well, drinking more fluids here.    Physical Exam: Constitutional:  Vitals:   09/29/18 0438 09/29/18 0740  BP: 98/65 (!) 102/51  Pulse: 75 72  Resp: 20 19  Temp: 97.6 F (36.4 C) 97.7 F (36.5 C)  SpO2: 100% 97%   patient appears in NAD Eyes: anicteric HENT: no thrush Respiratory: Normal respiratory effort; CTA B Cardiovascular: RRR GI: soft, nt, nd  Review of Systems: Constitutional: negative for fevers and chills Gastrointestinal: negative for nausea and diarrhea Integument/breast: negative for rash  Lab Results  Component Value Date   WBC 11.6 (H) 09/28/2018   HGB 9.0 (L) 09/28/2018   HCT 28.5 (L) 09/28/2018   MCV 91.6 09/28/2018   PLT 244 09/28/2018    Lab Results  Component Value Date   CREATININE 4.81 (H) 09/29/2018   BUN 95 (H) 09/29/2018   NA 135 09/29/2018   K 5.6 (H) 09/29/2018   CL 94 (L) 09/29/2018   CO2 21 (L) 09/29/2018    Lab Results  Component Value Date   ALT 13 09/22/2018   AST 20 09/22/2018   ALKPHOS 66 09/22/2018     Microbiology: Recent Results (from the past 240 hour(s))  Culture, blood (routine x 2)     Status: Abnormal   Collection Time: 09/22/18  7:28 PM  Result Value Ref Range Status   Specimen Description BLOOD RIGHT ANTECUBITAL  Final   Special Requests   Final    BOTTLES DRAWN AEROBIC AND ANAEROBIC Blood Culture adequate volume   Culture  Setup Time (A)  Final    GRAM VARIABLE ROD Organism ID to follow CRITICAL RESULT CALLED TO, READ BACK BY AND VERIFIED WITH: Hughie Closs [facility] 09/23/18 1741 JDW    Culture (A)  Final    LISTERIA MONOCYTOGENES Standardized susceptibility testing for this organism is not available. HEALTH DEPARTMENT NOTIFIED Performed at [facility], [place] [address], [place], [place] [address]    Report Status 09/25/2018 FINAL  Final   Blood Culture ID Panel (Reflexed)     Status: Abnormal   Collection Time: 09/22/18  7:28 PM  Result Value Ref Range Status   Enterococcus species NOT DETECTED NOT DETECTED Final   Vancomycin resistance NOT DETECTED NOT DETECTED Final   Listeria monocytogenes DETECTED (A) NOT DETECTED Final    Comment: CRITICAL RESULT CALLED TO, READ BACK BY AND VERIFIED WITH: Hughie Closs PHARMD 09/23/18 1741 JDW    Staphylococcus species NOT DETECTED NOT DETECTED Final   Staphylococcus aureus (BCID) NOT DETECTED NOT DETECTED Final   Methicillin resistance NOT DETECTED NOT DETECTED Final   Streptococcus species NOT DETECTED NOT DETECTED Final   Streptococcus agalactiae NOT DETECTED NOT DETECTED Final   Streptococcus pneumoniae NOT DETECTED NOT DETECTED Final   Streptococcus pyogenes NOT DETECTED NOT DETECTED Final   Acinetobacter baumannii NOT DETECTED NOT DETECTED Final   Enterobacteriaceae species NOT DETECTED NOT DETECTED Final   Enterobacter cloacae complex NOT DETECTED NOT DETECTED Final   Escherichia coli NOT DETECTED NOT DETECTED Final   Klebsiella oxytoca NOT DETECTED NOT DETECTED Final   Klebsiella pneumoniae NOT DETECTED NOT DETECTED Final   Proteus species NOT DETECTED NOT DETECTED Final   Serratia marcescens NOT DETECTED NOT DETECTED Final   Carbapenem resistance NOT DETECTED NOT DETECTED Final   Haemophilus influenzae NOT DETECTED NOT DETECTED Final  Neisseria meningitidis NOT DETECTED NOT DETECTED Final   Pseudomonas aeruginosa NOT DETECTED NOT DETECTED Final   Candida albicans NOT DETECTED NOT DETECTED Final   Candida glabrata NOT DETECTED NOT DETECTED Final   Candida krusei NOT DETECTED NOT DETECTED Final   Candida parapsilosis NOT DETECTED NOT DETECTED Final   Candida tropicalis NOT DETECTED NOT DETECTED Final    Comment: Performed at [facility], [place] [address], [place], [place] [address]  Culture, blood (routine x 2)     Status: Abnormal   Collection Time: 09/22/18  7:47 PM   Result Value Ref Range Status   Specimen Description BLOOD LEFT ANTECUBITAL  Final   Special Requests   Final    BOTTLES DRAWN AEROBIC ONLY Blood Culture adequate volume   Culture  Setup Time   Final    AEROBIC BOTTLE ONLY GRAM VARIABLE ROD CRITICAL VALUE NOTED.  VALUE IS CONSISTENT WITH PREVIOUSLY REPORTED AND CALLED VALUE.    Culture (A)  Final    LISTERIA MONOCYTOGENES Standardized susceptibility testing for this organism is not available. HEALTH DEPARTMENT NOTIFIED Performed at [facility], [place] [address] [place], [place] [address]    Report Status 09/25/2018 FINAL  Final  SARS Coronavirus 2 (CEPHEID - Performed in [facility] hospital lab), Hosp Order     Status: None   Collection Time: 09/22/18  7:47 PM  Result Value Ref Range Status   SARS Coronavirus 2 NEGATIVE NEGATIVE Final    Comment: (NOTE) If result is NEGATIVE SARS-CoV-2 target nucleic acids are NOT DETECTED. The SARS-CoV-2 RNA is generally detectable in upper and lower  respiratory specimens during the acute phase of infection. The lowest  concentration of SARS-CoV-2 viral copies this assay can detect is 250  copies / mL. A negative result does not preclude SARS-CoV-2 infection  and should not be used as the sole basis for treatment or other  patient management decisions.  A negative result may occur with  improper specimen collection / handling, submission of specimen other  than nasopharyngeal swab, presence of viral mutation(s) within the  areas targeted by this assay, and inadequate number of viral copies  (<250 copies / mL). A negative result must be combined with clinical  observations, patient history, and epidemiological information. If result is POSITIVE SARS-CoV-2 target nucleic acids are DETECTED. The SARS-CoV-2 RNA is generally detectable in upper and lower  respiratory specimens dur ing the acute phase of infection.  Positive  results are indicative of active infection with SARS-CoV-2.   Clinical  correlation with patient history and other diagnostic information is  necessary to determine patient infection status.  Positive results do  not rule out bacterial infection or co-infection with other viruses. If result is PRESUMPTIVE POSTIVE SARS-CoV-2 nucleic acids MAY BE PRESENT.   A presumptive positive result was obtained on the submitted specimen  and confirmed on repeat testing.  While 2019 novel coronavirus  (SARS-CoV-2) nucleic acids may be present in the submitted sample  additional confirmatory testing may be necessary for epidemiological  and / or clinical management purposes  to differentiate between  SARS-CoV-2 and other Sarbecovirus currently known to infect humans.  If clinically indicated additional testing with an alternate test  methodology [phone]) is advised. The SARS-CoV-2 RNA is generally  detectable in upper and lower respiratory sp ecimens during the acute  phase of infection. The expected result is Negative. Fact Sheet for Patients:  StrictlyIdeas.no Fact Sheet for Healthcare Providers: BankingDealers.co.za This test is not yet approved or  cleared by the [place] and has been authorized for detection and/or diagnosis of SARS-CoV-2 by FDA under an Emergency Use Authorization (EUA).  This EUA will remain in effect (meaning this test can be used) for the duration of the COVID-19 declaration under Section 564(b)(1) of the Act, 21 U.S.C. section 360bbb-3(b)(1), unless the authorization is terminated or revoked sooner. Performed at [facility], [place] [address], [place], [place] [address]   MRSA PCR Screening     Status: None   Collection Time: 09/23/18  2:31 AM  Result Value Ref Range Status   MRSA by PCR NEGATIVE NEGATIVE Final    Comment:        The GeneXpert MRSA Assay (FDA approved for NASAL specimens only), is one component of a comprehensive MRSA colonization surveillance program.  It is not intended to diagnose MRSA infection nor to guide or monitor treatment for MRSA infections. Performed at [facility], [place] [address], [place], [place] [address]   Culture, blood (routine x 2)     Status: None (Preliminary result)   Collection Time: 09/25/18  7:32 AM  Result Value Ref Range Status   Specimen Description BLOOD RIGHT HAND  Final   Special Requests   Final    BOTTLES DRAWN AEROBIC ONLY Blood Culture adequate volume   Culture   Final    NO GROWTH 4 DAYS Performed at [facility], [place] [address] [place], [place] [address]    Report Status PENDING  Incomplete  Culture, blood (routine x 2)     Status: None (Preliminary result)   Collection Time: 09/25/18  7:32 AM  Result Value Ref Range Status   Specimen Description BLOOD RIGHT HAND  Final   Special Requests   Final    BOTTLES DRAWN AEROBIC ONLY Blood Culture adequate volume   Culture   Final    NO GROWTH 4 DAYS Performed at [facility], [place] [address], [place], [place] [address]    Report Status PENDING  Incomplete  SARS Coronavirus 2 (CEPHEID - Performed in [facility] hospital lab), Hosp Order     Status: None   Collection Time: 09/28/18  2:25 AM  Result Value Ref Range Status   SARS Coronavirus 2 NEGATIVE NEGATIVE Final    Comment: (NOTE) If result is NEGATIVE SARS-CoV-2 target nucleic acids are NOT DETECTED. The SARS-CoV-2 RNA is generally detectable in upper and lower  respiratory specimens during the acute phase of infection. The lowest  concentration of SARS-CoV-2 viral copies this assay can detect is 250  copies / mL. A negative result does not preclude SARS-CoV-2 infection  and should not be used as the sole basis for treatment or other  patient management decisions.  A negative result may occur with  improper specimen collection / handling, submission of specimen other  than nasopharyngeal swab, presence of viral mutation(s) within the  areas targeted by this assay, and  inadequate number of viral copies  (<250 copies / mL). A negative result must be combined with clinical  observations, patient history, and epidemiological information. If result is POSITIVE SARS-CoV-2 target nucleic acids are DETECTED. The SARS-CoV-2 RNA is generally detectable in upper and lower  respiratory specimens dur ing the acute phase of infection.  Positive  results are indicative of active infection with SARS-CoV-2.  Clinical  correlation with patient history and other diagnostic information is  necessary to determine patient infection status.  Positive results do  not rule out bacterial infection  or co-infection with other viruses. If result is PRESUMPTIVE POSTIVE SARS-CoV-2 nucleic acids MAY BE PRESENT.   A presumptive positive result was obtained on the submitted specimen  and confirmed on repeat testing.  While 2019 novel coronavirus  (SARS-CoV-2) nucleic acids may be present in the submitted sample  additional confirmatory testing may be necessary for epidemiological  and / or clinical management purposes  to differentiate between  SARS-CoV-2 and other Sarbecovirus currently known to infect humans.  If clinically indicated additional testing with an alternate test  methodology [phone]) is advised. The SARS-CoV-2 RNA is generally  detectable in upper and lower respiratory sp ecimens during the acute  phase of infection. The expected result is Negative. Fact Sheet for Patients:  StrictlyIdeas.no Fact Sheet for Healthcare Providers: BankingDealers.co.za This test is not yet approved or cleared by the [place] FDA and has been authorized for detection and/or diagnosis of SARS-CoV-2 by FDA under an Emergency Use Authorization (EUA).  This EUA will remain in effect (meaning this test can be used) for the duration of the COVID-19 declaration under Section 564(b)(1) of the Act, 21 U.S.C. section 360bbb-3(b)(1), unless the  authorization is terminated or revoked sooner. 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  He overall seems improved from infection standpoint.  I think at this point, we will continue just ampicillin monotherapy through the same stop date of May 25 and avoid a second agent.    2.  Renal failure - acute on chronic.  Likely a combination of dehydration and Bactrim.  Have stopped Bactrim.   3.  Leukocytosis - mild elevation yesterday, no changes to above.

## 2018-09-29 NOTE — Progress Notes (Signed)
Addendum  Discussed with Dr. Haroldine Laws who recommends transferring patient top Progressive Care Unit 2C/4E. See his note below.  "With rising creatinine, worsening EF and probable persistent volume overload would move to SDU and transduce CVP and measure co-ox.. May need CVVHD - Hold carvedilol.   Transfer orders placed.  Vernell Leep, MD, FACP, [facility]. [facility]  To contact the attending provider between 7A-7P or the covering provider during after hours 7P-7A, please log into the web site [email or web address] and access using universal [place] password for that web site. If you do not have the password, please call the hospital operator.

## 2018-09-29 NOTE — Progress Notes (Signed)
Advanced Heart Failure Rounding Note   Subjective:    On IV lasix.  Only mild urine output (750cc) recorded but 2 unmeasured outputs reported. Weight up a pound. Says his breathing feels better. Able to lie flat. No orthopnea or PND.   Creatinine continues to climb 4.4-> 4.8. (baseline ~ 3.2).  Received Lokelma. Potassium 6.7-> 5.6  ID switched to ampicillin for listeriosis. COVID negative x 2. Afebrile   Objective:   Weight Range:  Vital Signs:   Temp:  [97.3 F (36.3 C)-97.7 F (36.5 C)] 97.7 F (36.5 C) (05/11 0740) Pulse Rate:  [71-75] 72 (05/11 0740) Resp:  [19-21] 19 (05/11 0740) BP: (90-102)/(51-65) 102/51 (05/11 0740) SpO2:  [95 %-100 %] 97 % (05/11 0740) Weight:  [84.6 kg] 84.6 kg (05/11 0326) Last BM Date: 09/26/18  Weight change: Filed Weights   09/28/18 0509 09/29/18 0326  Weight: 84.3 kg 84.6 kg    Intake/Output:   Intake/Output Summary (Last 24 hours) at 09/29/2018 1124 Last data filed at 09/29/2018 0934 Gross per 24 hour  Intake 505.63 ml  Output 750 ml  Net -244.37 ml     Physical Exam: General:  Elderly frail appearing  No resp difficulty HEENT: normal Neck: supple. Difficult to see JVP. Carotids 2+ bilat; no bruits. No lymphadenopathy or thryomegaly appreciated. Cor: PMI nondisplaced. Regular rate & rhythm. No rubs, gallops or murmurs. Lungs: clear Abdomen: soft, nontender, minimally distended. No hepatosplenomegaly. No bruits or masses. Good bowel sounds. Extremities: no cyanosis, clubbing, rash, edema +SCDs  RUE PICC Neuro: alert & orientedx3, cranial nerves grossly intact. moves all 4 extremities w/o difficulty. Affect pleasant  Telemetry: Sinus 70s Personally reviewed   Labs: Basic Metabolic Panel: Recent Labs  Lab 09/23/18 0209  09/28/18 0210 09/28/18 0829 09/28/18 1509 09/28/18 2132 09/29/18 0604  NA 138   < > 133* 134* 134* 134* 135  K 4.1   < > 6.2* 6.2* 6.7* 5.5* 5.6*  CL 102   < > 95* 98 98 99 94*  CO2 17*   < > 16*  19* 20* 19* 21*  GLUCOSE 119*   < > 130* 122* 114* 136* 95  BUN 91*   < > 83* 86* 91* 91* 95*  CREATININE 3.66*   < > 4.20* 4.24* 4.27* 4.46* 4.81*  CALCIUM 9.0   < > 9.0 9.0 9.0 8.9 8.8*  PHOS 4.0  --   --   --   --   --   --    < > = values in this interval not displayed.    Liver Function Tests: Recent Labs  Lab 09/22/18 1933 09/23/18 0209  AST 20  --   ALT 13  --   ALKPHOS 66  --   BILITOT 1.1  --   PROT 7.9  --   ALBUMIN 3.5 3.2*   No results for input(s): LIPASE, AMYLASE in the last 168 hours. No results for input(s): AMMONIA in the last 168 hours.  CBC: Recent Labs  Lab 09/24/18 0229 09/25/18 0552 09/26/18 0945 09/28/18 0210 09/28/18 0829  WBC 7.0 6.0 5.7 7.6 11.6*  NEUTROABS 5.7 4.8 4.4  --   --   HGB 9.9* 9.4* 8.8* 9.6* 9.0*  HCT 31.3* 29.4* 27.6* 31.6* 28.5*  MCV 91.8 90.5 90.2 95.5 91.6  PLT 181 172 195 260 244    Cardiac Enzymes: Recent Labs  Lab 09/23/18 0209 09/23/18 0711 09/23/18 1708 09/23/18 1930 09/24/18 0229  TROPONINI 0.10* 0.12* 0.16* 0.16* 0.15*  BNP: BNP (last 3 results) Recent Labs    06/25/18 1209 09/22/18 1857 09/28/18 0210  BNP 800.3* 1,259.5* 1,315.0*    ProBNP (last 3 results) No results for input(s): PROBNP in the last 8760 hours.    Other results:  Imaging: Dg Chest Portable 1 View  Result Date: 09/28/2018 CLINICAL DATA:  85 y/o  M; shortness of breath all day. EXAM: PORTABLE CHEST 1 VIEW COMPARISON:  09/25/2018 chest radiograph FINDINGS: Cardiomegaly and 3 lead pacemaker. Post median sternotomy and CABG. Increased diffuse reticular and patchy bibasilar opacities of the lungs. Probable small bilateral pleural effusions. Right PICC catheter tip projects over the lower SVC. No acute osseous abnormality identified. IMPRESSION: Increased diffuse reticular and patchy bibasilar opacities, likely pulmonary edema. Probable small bilateral pleural effusions. Electronically Signed   By: Kristine Garbe M.D.   On:  09/28/2018 02:35      Medications:     Scheduled Medications:  amiodarone  100 mg Oral Daily   apixaban  2.5 mg Oral BID   B-complex with vitamin C  1 tablet Oral Daily   carvedilol  6.25 mg Oral BID WC   magnesium oxide  400 mg Oral Daily   multivitamin  1 tablet Oral BID   sodium chloride flush  3 mL Intravenous Q12H   sodium zirconium cyclosilicate  10 g Oral BID   vitamin C  1,000 mg Oral Daily     Infusions:  sodium chloride 250 mL (09/28/18 2123)   ampicillin (OMNIPEN) IV 2 g (09/29/18 0616)     PRN Medications:  sodium chloride, albuterol   Assessment/Plan:   1. 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 He sas been started on ampicillin + bactrim with plan for 3 week treatment. He has denied abdominal discomfort/diarrhea, has not been eating out. He was neurologically clear at discharge without localizing CNS symptoms/signs.  ID thought we could hold off on further CNS imaging (MRI, LP) with no signs of meningoencephalitis. Suspect Bactrim may have triggered AKI/hyperkalemia.  - ID following. On ampicillin. Of bactrim.  - COVID negative x 2 2. Acute on chronic systolic ZES:PQZRAQTM cardiomyopathy. Echo in 1/19 showed EF 35-40% (stable from most recent prior). Has [employer] CRT-P device. On device check on 08/06/18, he was appropriately BiV pacing with extremely rare AF, thoracic impedance was low. I recently increased his torsemide to 40 mg daily for volume overload. On admission, he looked  volume overloaded, likely in setting of AKI on CKD IV with Bactrim use.  BNP elevated, pulmonary edema on CXR.  - Echo 09/28/18  -Weight essentially unchanged with IV lasix but says he feels better and I d/w Dr. Lavella Lemons who saw him yesterday and today and feels his breathing is much better - I don't see his JVP very well but weight is up about 7-8 pounds from baseline so I suspect he is volume overloaded and may be low output.  - Will hold diuretics for now.    - With rising creatinine, worsening EF and probable persistent volume overload would move to SDU and transduce CVP and measure co-ox.. May need CVVHD - Hold carvedilol.  - Off spiro with increased creatinine and potassium. - Not on ACEI/ARB/ARNI with CKD. 3. Atrial fibrillation: Paroxysmal. <1% atrial fibrillation on recent device check. He is currently a-paced.  - Continue amiodarone 100 mg daily. - He is on Eliquis 2.5 mg bid (age, elevated creatinine).  4. CAD:H/o CABG. No chest pain. -Refuses statin. - He is not on ASA given use of Eliquis  and stable CAD.  5. Sick sinus syndrome: s/p CRT-P.No change. 6. AKI on CKD, stage LF:[id number] up 4.2 -> 4.4 -> 4.8 with hyperkalemia in setting of Bactrim use.   - Off bactrim  - Getting Lokelma per [facility]. Repeat K 5.7 today - Nephrology to see re possible CVVHD - Considerholding Eliquis if he needs HD catheter,  7. Deconditioning: PT/OT.    Length of Stay: 1   Glori Bickers MD 09/29/2018, 11:24 AM  Advanced Heart Failure Team Pager [phone] (M-F; 7a - 4p)  Please contact [facility] for night-coverage after hours (4p -7a ) and weekends on [email or web address]

## 2018-09-29 NOTE — Progress Notes (Signed)
Addendum:  I reassessed patient at new location at bedside.  Patient resting comfortably in bed.  Supine without distress.  Sleeping but arousable  RS: Clear anteriorly.  No increased work of breathing.  Discussed with RN at bedside.  Awaiting to check CVP and medication management by Cardiology.   Vernell Leep, MD, FACP, [facility]. [facility]  To contact the attending provider between 7A-7P or the covering provider during after hours 7P-7A, please log into the web site [email or web address] and access using universal [place] password for that web site. If you do not have the password, please call the hospital operator.

## 2018-09-29 NOTE — Progress Notes (Signed)
   CVP 18. Co-ox 38.5%. Consistent with low output HF with volume overload.   Start milrinone and high-dose lasix. If no response may need CVVHD.   Glori Bickers, MD  8:58 PM

## 2018-09-29 NOTE — Consult Note (Signed)
[facility]    NEPHROLOGY CONSULTATION NOTE  PATIENT ID:  Marcus [name], DOB:  03-04-1929  HPI: The patient is a 85 y.o. year old male with a past medical history significant for paroxysmal atrial fibrillation on Eliquis, sick sinus syndrome status post permanent pacemaker, ischemic cardiomyopathy with an ejection fraction of 35 to 34%, chronic systolic CHF, coronary artery disease status post CABG and PCI's, stage IV chronic kidney disease with a baseline serum creatinine around 3, hyperlipidemia, hypertension, DVT, hard of hearing, and a recent hospitalization from 5/4-5/8 for Listeria bacteremia who presented for progressive dyspnea, weakness, nausea, vomiting, and lower extremity edema.  He was discharged on ampicillin and high-dose Bactrim through 5/25 and furosemide.  His serum creatinine has notably worsened to 4.81 with a potassium of 5.6, prompting renal evaluation.   Past Medical History:  Diagnosis Date  . Atrial flutter ([place])    a. Dx 07/2014, s/p TEE/DCCV.  . Cataracts, bilateral   . Chronic systolic CHF (congestive heart failure) ([place])    a. s/p CRT-D 09/2013. b. Device explant with temp perm then subsequent CRT-P 07/2014.  Marland Kitchen CKD (chronic kidney disease), stage III ([place])   . Coronary artery disease    a. s/p remote CABG x 2(VG->OM, LIMA->LAD;  b. Late 90's s/p PCI of RCA;  c. 12/09 Cath/PCI: LM 23m 70-80d (3.0x146mXience DES), LAD100p, LCX 80-90p (2.25x2843mxus Atom DES), RCA 100d (2.5x20m29mence DES), VG->OM 100, LIMA->LAD nl, EF 30-35%.  . DVMarland Kitchen (deep venous thrombosis) (HCC)[id number]   Right knee  . Hard of hearing   . History of colon polyps   . History of gastric ulcer   . History of gout   . History of shingles   . Hyperlipidemia   . Hypertension   . Ischemic cardiomyopathy    a. EF 35-40% by echo 05/2012, b. EF 20-25%, akinesis and scarring of inferolateral, inferior and inferoseptal myocardium, mild AI, mod MR, LA mod dilated, RV mildly dialted and sys  fx mildly reduced, RA mildly dilated (09/2013)  c. EF 35-40% by 2D in 07/2014, 25-30% by TEE.  . Macular degeneration, dry   . 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 PAF (paroxysmal atrial fibrillation) (HCC)[place]. Peripheral vascular disease (HCC)[place]. Presence of permanent cardiac pacemaker 07/23/2014   [id number] #[id number] cancer (HCC)[place]. Symptomatic bradycardia    a. 08/2003 s/p MDT Enpulse [id number] Dual chamber PPM ser # [id number]. Device explant due to infection/temp perm insertion/PPM 07/2014.    Past Surgical History:  Procedure Laterality Date  . ANGIOPLASTY     stent placement  . APPENDECTOMY    . BI-VENTRICULAR PACEMAKER INSERTION [name] 07/23/2014   Procedure: BI-VENTRICULAR PACEMAKER INSERTION (CRT-P);  Surgeon: StevDeboraha Sprang;  [facility];  Service: Cardiovascular;  Laterality: [name];  . BI-VENTRICULAR PACEMAKER UPGRADE [name] 10/08/2013   Procedure: BI-VENTRICULAR PACEMAKER UPGRADE;  Surgeon: GregEvans Lance;  [facility];  Service: Cardiovascular;  Laterality: [name];  . CARDIAC CATHETERIZATION  most recent in 2009   several  . CARDIAC CATHETERIZATION Right 07/19/2014   Procedure: TEMPORARY PACEMAKER;  Surgeon: GregEvans Lance;  [facility] [place] [place];  Laterality: Right;  . CARDIAC CATHETERIZATION [name] 03/01/2016   Procedure: Right Heart Cath;  Surgeon: DaltLarey Dresser;  [facility];  Service: Cardiovascular;  Laterality: [name];  . CARDIOVERSION  06/26/2012   Procedure: CARDIOVERSION;  Surgeon: Lelon Perla, MD;  [facility] ENDOSCOPY;  Service: Cardiovascular;  Laterality: [name];  . CARDIOVERSION [name] 07/27/2014   Procedure: CARDIOVERSION;  Surgeon: [name], MD;  Location: [place];  Service: Cardiovascular;  Laterality: [name];  . CATARACT EXTRACTION W/PHACO Left 09/24/2016   Procedure: CATARACT EXTRACTION PHACO AND INTRAOCULAR LENS PLACEMENT ([place])  Left;  Surgeon: Leandrew Koyanagi, MD;  Location: [place];  Service: Ophthalmology;  Laterality: Left;  . CATARACT EXTRACTION W/PHACO Right 10/17/2016   Procedure: CATARACT EXTRACTION PHACO AND INTRAOCULAR LENS PLACEMENT ([place])  Right;  Surgeon: Leandrew Koyanagi, MD;  Location: [place];  Service: Ophthalmology;  Laterality: Right;  prefers mid to late morning arrival  . COLONOSCOPY    . CORONARY ANGIOPLASTY     couple   . CORONARY ARTERY BYPASS GRAFT  1990   left main  . DOPPLER ECHOCARDIOGRAPHY  06/24/2002   EF 60-65%  . EMBOLECTOMY Right 07/28/2013   Procedure: Right Popliteal and Tibial Embolectomy;  Surgeon: Angelia Mould, MD;  Location: [place];  Service: Vascular;  Laterality: Right;  . INSERT / REPLACE / REMOVE PACEMAKER     1st pacemaker placed 10+yrs ago  . PACEMAKER LEAD REMOVAL Right 07/19/2014   Procedure: PACEMAKER LEAD REMOVAL/EXTRACTION;  Surgeon: [name], MD;  Location: [place];  Service: Cardiovascular;  Laterality: Right;  DR. Lucianne Lei TRIGT TO BACK UP CASE  . PERMANENT PACEMAKER GENERATOR CHANGE [name] 08/27/2012   Procedure: PERMANENT PACEMAKER GENERATOR CHANGE;  Surgeon: [name], MD;  Location[facility] CATH LAB;  Service: Cardiovascular;  Laterality: [name];  . TEE WITHOUT CARDIOVERSION  06/26/2012   Procedure: TRANSESOPHAGEAL ECHOCARDIOGRAM (TEE);  Surgeon: Lelon Perla, MD;  Location: [facility] ENDOSCOPY;  Service: Cardiovascular;  Laterality: [name];  . TEE WITHOUT CARDIOVERSION [name] 07/27/2014   Procedure: TRANSESOPHAGEAL ECHOCARDIOGRAM (TEE);  Surgeon: [name], MD;  Location: [facility] ENDOSCOPY;  Service: Cardiovascular;  Laterality: [name];    Family History  Problem Relation Age of Onset  . Hypertension Mother   . Colon cancer Mother   . Cancer Mother        Colon  . Alcohol abuse Father     Social History   Tobacco Use  . Smoking status: Former Smoker    Last attempt to quit: 02/10/1971    Years since quitting: 47.6  . Smokeless tobacco: Never Used  . Tobacco  comment: quit smoking 9yr ago  Substance Use Topics  . Alcohol use: No    Comment: nothing in 775yr  . Drug use: No    REVIEW OF SYSTEMS: General: Positive weakness Head:  no headaches Eyes:  no blurred vision ENT:  no sore throat Neck:  no masses CV:  no chest pain, no orthopnea Lungs:  no shortness of breath, no cough, on oxygen GI:  no nausea or vomiting, no diarrhea GU:  no dysuria or hematuria Skin:  no rashes or lesions Neuro:  no focal numbness or weakness Psych:  no depression or anxiety   PHYSICAL EXAM:  Vitals:   09/29/18 0740 09/29/18 1130  BP: (!) 102/51 102/67  Pulse: 72 76  Resp: 19 20  Temp: 97.7 F (36.5 C) (!) 97.5 F (36.4 C)  SpO2: 97% 99%   I/O last 3 completed shifts: In: 775.6 [P.O.:510; I.V.:65.6; IV Piggyback:200] Out: 750 [Urine:750]   General:  AAOx3 NAD HEENT: MMM [place] AT anicteric sclera Neck: Positive JVD, no adenopathy CV:  Heart RRR  Lungs: Basilar Rales Abd:  abd SNT/ND with  normal BS GU:  Bladder non-palpable Extremities: 1 bilateral lower extremity edema Skin:  No skin rash Psych:  normal mood and affect Neuro:  no focal deficits   CURRENT MEDICATIONS:  . amiodarone  100 mg Oral Daily  . apixaban  2.5 mg Oral BID  . B-complex with vitamin C  1 tablet Oral Daily  . magnesium oxide  400 mg Oral Daily  . multivitamin  1 tablet Oral BID  . sodium chloride flush  3 mL Intravenous Q12H  . sodium zirconium cyclosilicate  10 g Oral BID  . vitamin C  1,000 mg Oral Daily     HOME MEDICATIONS:  Prior to Admission medications   Medication Sig Start Date End Date Taking? Authorizing Provider  acetaminophen (TYLENOL) 325 MG tablet Take 2 tablets (650 mg total) by mouth every 6 (six) hours as needed for mild pain or fever (or Fever >/= 101). 09/26/18   Pokhrel, Corrie Mckusick, MD  amiodarone (PACERONE) 200 MG tablet TAKE 1/2 TABLET (100MG     TOTAL) DAILY Patient taking differently: Take 100 mg by mouth daily.  08/27/18   [name],  MD  ampicillin IVPB Inject 2 g into the vein every 8 (eight) hours for 17 days. As a continuous infusion Indication:  Listeria Bacteremia  Last Day of Therapy:  10/13/2018 Labs -Twice weekly:  CBC/D and BMP, Labs - Every other week:  ESR and CRP 09/26/18 10/13/18  Pokhrel, Laxman, MD  Ascorbic Acid (VITAMIN C) 1000 MG tablet Take 1,000 mg by mouth daily.    [provider]  b complex vitamins tablet Take 1 tablet by mouth daily.    [provider]  carvedilol (COREG) 6.25 MG tablet Take 1 tablet (6.25 mg total) by mouth 2 (two) times daily with a meal. 09/26/18   Pokhrel, Laxman, MD  Coenzyme Q10 (CO Q 10) 100 MG CAPS Take 100 mg by mouth 2 (two) times daily.     [provider]  ELIQUIS 2.5 MG TABS tablet TAKE 1 TABLET TWICE A DAY Patient taking differently: Take 2.5 mg by mouth 2 (two) times daily.  08/27/18   [name], MD  Magnesium 250 MG TABS Take 250 mg by mouth daily.    [provider]  Multiple Vitamins-Minerals (PRESERVISION AREDS 2) CAPS Take 1 capsule by mouth 2 (two) times a day.    [provider]  NON FORMULARY Take 1 capsule by mouth See admin instructions. NAD+ (Nicotinamide Adenine Dinucleotide) capsules: Take 1 capsule by mouth once a day    [provider]  potassium chloride SA (K-DUR) 20 MEQ tablet Take 1 tablet (20 mEq total) by mouth daily. 09/26/18   Pokhrel, Corrie Mckusick, MD  Resveratrol 100 MG CAPS Take 100 mg by mouth daily.    [provider]  sulfamethoxazole-trimethoprim (BACTRIM DS) 800-160 MG tablet Take 2 tablets by mouth 2 (two) times daily for 18 days. 09/25/18 10/13/18  Pokhrel, Corrie Mckusick, MD  torsemide (DEMADEX) 20 MG tablet Take 2 tablets (40 mg total) by mouth daily. 08/22/18   [name], MD  zinc gluconate 50 MG tablet Take 50 mg by mouth daily.    [provider]       LABS:  CBC Latest Ref Rng & Units 09/28/2018 09/28/2018 09/26/2018  WBC 4.0 - 10.5 K/uL 11.6(H) 7.6 5.7  Hemoglobin 13.0 -  17.0 g/dL 9.0(L) 9.6(L) 8.8(L)  Hematocrit 39.0 - 52.0 % 28.5(L) 31.6(L) 27.6(L)  Platelets 150 - 400 K/uL 244 260 195  CMP Latest Ref Rng & Units 09/29/2018 09/28/2018 09/28/2018  Glucose 70 - 99 mg/dL 95 136(H) 114(H)  BUN 8 - 23 mg/dL 95(H) 91(H) 91(H)  Creatinine 0.61 - 1.24 mg/dL 4.81(H) 4.46(H) 4.27(H)  Sodium 135 - 145 mmol/L 135 134(L) 134(L)  Potassium 3.5 - 5.1 mmol/L 5.6(H) 5.5(H) 6.7(HH)  Chloride 98 - 111 mmol/L 94(L) 99 98  CO2 22 - 32 mmol/L 21(L) 19(L) 20(L)  Calcium 8.9 - 10.3 mg/dL 8.8(L) 8.9 9.0  Total Protein 6.5 - 8.1 g/dL - - -  Total Bilirubin 0.3 - 1.2 mg/dL - - -  Alkaline Phos 38 - 126 U/L - - -  AST 15 - 41 U/L - - -  ALT 0 - 44 U/L - - -    Lab Results  Component Value Date   CALCIUM 8.8 (L) 09/29/2018   CAION 1.15 05/03/2008   PHOS 4.0 09/23/2018       Component Value Date/Time   COLORURINE YELLOW 09/22/2018 1932   APPEARANCEUR CLEAR 09/22/2018 1932   LABSPEC 1.011 09/22/2018 1932   PHURINE 5.0 09/22/2018 1932   GLUCOSEU NEGATIVE 09/22/2018 1932   HGBUR NEGATIVE 09/22/2018 1932   BILIRUBINUR NEGATIVE 09/22/2018 1932   KETONESUR NEGATIVE 09/22/2018 1932   PROTEINUR NEGATIVE 09/22/2018 1932   UROBILINOGEN 0.2 05/24/2012 1344   NITRITE NEGATIVE 09/22/2018 1932   LEUKOCYTESUR NEGATIVE 09/22/2018 1932      Component Value Date/Time   HCO3 19.1 (L) 03/01/2016 1357   HCO3 18.8 (L) 03/01/2016 1357   TCO2 20 03/01/2016 1357   TCO2 20 03/01/2016 1357   ACIDBASEDEF 8.0 (H) 03/01/2016 1357   ACIDBASEDEF 8.0 (H) 03/01/2016 1357   O2SAT 58.0 03/01/2016 1357   O2SAT 61.0 03/01/2016 1357       Component Value Date/Time   IRON 43 (L) 09/28/2018 0829   TIBC 294 09/28/2018 0829   FERRITIN 1,089 (H) 09/28/2018 0829   IRONPCTSAT 15 (L) 09/28/2018 0829       ASSESSMENT/PLAN:     The patient is a 85 y.o. year old male with a past medical history significant for paroxysmal atrial fibrillation on Eliquis, sick sinus syndrome status post permanent  pacemaker, ischemic cardiomyopathy with an ejection fraction of 35 to 46%, chronic systolic CHF, coronary artery disease status post CABG and PCI's, stage IV chronic kidney disease with a baseline serum creatinine around 3, hyperlipidemia, hypertension, DVT, hard of hearing, and a recent hospitalization from 5/4-5/8 for Listeria bacteremia who presented for progressive dyspnea, weakness, nausea, vomiting, and lower extremity edema.  He was discharged on ampicillin and high-dose Bactrim through 5/25 and furosemide.  His serum creatinine has notably worsened to 4.81 with a potassium of 5.6, prompting renal evaluation.  1.  Chronic kidney disease stage IV with a baseline serum creatinine around 3 likely on the basis of longstanding hypertension and atherosclerotic disease.  Has seen Dr. Hollie Salk x1 in the office.  2.  Acute kidney injury.  This is likely multifactorial, potentially related to low output cardiac failure and possibly Bactrim and ampicillin.  Will check urinalysis for evidence of AIN.  Bactrim has been discontinued.  Ampicillin is continuing for treatment of the bacteremia.  Will monitor renal function closely.  Cardiology has requested transfer to the PCU for further cardiac evaluation.  We will hold off on consideration of dialysis unless absolutely necessary as he is not a dialysis candidate given his advanced age and multiple comorbidities.  Is nonoliguric.  Foley catheter in place.  3.  Hyperkalemia.  Likely secondary to  the Bactrim potassium supplements.  Potassium has improved.  4.  Paroxysmal atrial fibrillation.  Continue amiodarone.  Carvedilol has been discontinued for low blood pressure.  5.  Recent Listeria bacteremia.  Continues on IV ampicillin.  6.  Anemia of chronic disease.  Hemoglobin is stable around 9.  [place], DO, [place]

## 2018-09-29 NOTE — Progress Notes (Addendum)
PROGRESS NOTE   Marcus [name]  VQM:[id number]    DOB: 02-02-1929    DOA: 09/28/2018  PCP: Patient, No Pcp Per   I have briefly reviewed patients previous medical records in [facility].  Brief Narrative:  85 year old married male, PMH of PAF on Eliquis, SSS s/p PPM, ICM (LVEF 93-26%), chronic systolic CHF, CAD s/p CABG & PCI's, stage IV CKD, HLD, HTN,, DVT, Hard of Hearing, recent hospitalization 5/4-5/8 for Listeria bacteremia, suspected pneumonia, acute on chronic systolic CHF, acute on chronic kidney disease, was discharged on ampicillin + high-dose Bactrim through 10/13/2018 and torsemide, presented back to [facility] on 5/10 due to progressive dyspnea, weakness, nausea, vomiting and lower extremity edema.  Admitted for acute on chronic systolic CHF, acute on stage IV chronic kidney disease with hyperkalemia and metabolic acidosis and acute hypoxic respiratory failure.  Consulted cardiology, ID, PMT and nephrology.  Assessment & Plan:   Active Problems:   Acute on chronic systolic CHF (congestive heart failure) (HCC)   Hypoxia   Hyperkalemia   Acute respiratory failure with hypoxia (HCC)   Acute respiratory distress   Palliative care by specialist   DNR (do not resuscitate) discussion   Weakness generalized   1. Acute on chronic systolic CHF/ICM: TTE 11/30/4578: LVEF 35-40%, akinesis of inferolateral and inferior myocardium and grade 3 diastolic dysfunction. Acute CHF likely precipitated by volume overload related to acute on chronic kidney disease from high-dose Bactrim.  Cardiology input appreciated.  Continue carvedilol.  Not on Aldactone, ACEI/ARB/ARN I due to CKD.  TTE 5/10: LVEF 20-25% with akinesis multiple LV walls.  Moderate to severe pulmonary hypertension with severe RV dilatation and systolic dysfunction.  Treated aggressively with high-dose IV Lasix 80 mg every 12 hours since admission.  Only 750 mL urine recorded yesterday but had 2 unmeasured urine outputs.  Clinically does  appear slightly better but still significantly volume overloaded.  Creatinine however continues to rise/4.8 today.  Not the long-term HD candidate.  Await Cardiology/Nephrology (consulted 5/11) input regarding diuretic management. 2. Acute on stage IV chronic kidney disease: Baseline creatinine lately in the high 2/low 3 range.  Creatinine 3.24 at discharge on 5/8.  Presented on 5/10 with the following BMP: Potassium 6.2, bicarbonate 16, BUN 83 and creatinine 4.2.  Multifactorial acute kidney injury but most likely due to high-dose Bactrim.  Bactrim discontinued.  Diuresing for decompensated CHF. Has seen Dr. Madelon Lips once as OP.  Creatinine has progressively increased to 4.8.  Nephrology consulted for assistance. 3. Hyperkalemia: Presented with potassium of 6.2.  Due to Bactrim, AoCKD and potassium supplements.  Renal diet/low potassium diet.  Potassium peaked to 6.7.  Treated with multiple scheduled doses of Lokelma 10 mg.  Potassium down to 5.6.  Continue Lokelma. 4. Listeria bacteremia: Continue IV ampicillin.  Bactrim discontinued. SARS coronavirus 2 testing x2: Negative.  Blood cultures x2 from 5/7: Negative to date.  ID input appreciated, now on ampicillin monotherapy through 10/13/2018. 5. Essential hypertension: Soft blood pressures.  Continue carvedilol 6.25 mg twice daily.  No change/stable. 6. PAF: Currently AV paced and controlled rate.  Remains on Eliquis but need to closely monitor for worsening renal insufficiency in which case may have to discontinue.  Continue amiodarone & carvedilol. 7. SSS s/PPM: 8. CAD status post CABG & PCI's: No chest pain reported. 9. Anemia of chronic disease: Stable   DVT prophylaxis: Currently on Eliquis Code Status: Partial code: Wants intubation/mechanical ventilation, BiPAP, ACLS meds but no CPR or shocking. Family Communication: I called  and discussed in detail with patient spouse on 5/11, updated critical nature of his illness and ongoing care in  detail. She faced timed patient today along with PMT and states that he looked better than she thought he would but appeared somewhat confused. Disposition: To be determined pending clinical improvement.    Consultants:  Cardiology ID PMT Nephrology  Procedures:  None  Antimicrobials:  IV ampicillin   Subjective: Indicates that his breathing is better but not yet at baseline.  States that he did not make much urine after IV Lasix.  Denies any other complaints.  As per RN, no acute issues reported.  ROS: As above, otherwise negative  Objective:  Vitals:   09/28/18 2007 09/29/18 0326 09/29/18 0438 09/29/18 0740  BP: 99/62  98/65 (!) 102/51  Pulse: 72  75 72  Resp: 20  20 19   Temp: (!) 97.3 F (36.3 C)  97.6 F (36.4 C) 97.7 F (36.5 C)  TempSrc: Oral  Oral Oral  SpO2: 98%  100% 97%  Weight:  84.6 kg    Height:        Examination:  General exam: Elderly male, moderately built and frail, ill looking, sitting up in bed in no obvious distress.  Looks somewhat improved compared to yesterday.  Had his oxygen off and was saturating in the 89- 90%, placed back nasal cannula oxygen.  Hard of hearing. Respiratory system: Improved breath sounds compared to yesterday.  Still harsh, mostly posteriorly with scattered basal crackles.  No wheezing or rhonchi.  No increased work of breathing. Cardiovascular system: S1 & S2 heard, RRR. No murmurs, rubs, gallops or clicks.  1+ pitting bilateral leg edema.  JVD +.  Telemetry personally reviewed: AV paced rhythm. Gastrointestinal system: Abdomen is nondistended, soft and nontender. No organomegaly or masses felt. Normal bowel sounds heard. Central nervous system: Alert and oriented x2. No focal neurological deficits. Extremities: Symmetric 5 x 5 power. Skin: No rashes, lesions or ulcers Psychiatry: Judgement and insight appear somewhat impaired. Mood & affect appropriate.     Data Reviewed: I have personally reviewed following labs and  imaging studies  CBC: Recent Labs  Lab 09/24/18 0229 09/25/18 0552 09/26/18 0945 09/28/18 0210 09/28/18 0829  WBC 7.0 6.0 5.7 7.6 11.6*  NEUTROABS 5.7 4.8 4.4  --   --   HGB 9.9* 9.4* 8.8* 9.6* 9.0*  HCT 31.3* 29.4* 27.6* 31.6* 28.5*  MCV 91.8 90.5 90.2 95.5 91.6  PLT 181 172 195 260 097   Basic Metabolic Panel: Recent Labs  Lab 09/23/18 0209  09/28/18 0210 09/28/18 0829 09/28/18 1509 09/28/18 2132 09/29/18 0604  NA 138   < > 133* 134* 134* 134* 135  K 4.1   < > 6.2* 6.2* 6.7* 5.5* 5.6*  CL 102   < > 95* 98 98 99 94*  CO2 17*   < > 16* 19* 20* 19* 21*  GLUCOSE 119*   < > 130* 122* 114* 136* 95  BUN 91*   < > 83* 86* 91* 91* 95*  CREATININE 3.66*   < > 4.20* 4.24* 4.27* 4.46* 4.81*  CALCIUM 9.0   < > 9.0 9.0 9.0 8.9 8.8*  PHOS 4.0  --   --   --   --   --   --    < > = values in this interval not displayed.   Liver Function Tests: Recent Labs  Lab 09/22/18 1933 09/23/18 0209  AST 20  --   ALT 13  --  ALKPHOS 66  --   BILITOT 1.1  --   PROT 7.9  --   ALBUMIN 3.5 3.2*   Cardiac Enzymes: Recent Labs  Lab 09/23/18 0209 09/23/18 0711 09/23/18 1708 09/23/18 1930 09/24/18 0229  TROPONINI 0.10* 0.12* 0.16* 0.16* 0.15*     Recent Results (from the past 240 hour(s))  Culture, blood (routine x 2)     Status: Abnormal   Collection Time: 09/22/18  7:28 PM  Result Value Ref Range Status   Specimen Description BLOOD RIGHT ANTECUBITAL  Final   Special Requests   Final    BOTTLES DRAWN AEROBIC AND ANAEROBIC Blood Culture adequate volume   Culture  Setup Time (A)  Final    GRAM VARIABLE ROD Organism ID to follow CRITICAL RESULT CALLED TO, READ BACK BY AND VERIFIED WITH: Hughie Closs [facility] 09/23/18 1741 JDW    Culture (A)  Final    LISTERIA MONOCYTOGENES Standardized susceptibility testing for this organism is not available. HEALTH DEPARTMENT NOTIFIED Performed at [facility], [place] [address], [place], [place] [address]    Report Status 09/25/2018 FINAL   Final  Blood Culture ID Panel (Reflexed)     Status: Abnormal   Collection Time: 09/22/18  7:28 PM  Result Value Ref Range Status   Enterococcus species NOT DETECTED NOT DETECTED Final   Vancomycin resistance NOT DETECTED NOT DETECTED Final   Listeria monocytogenes DETECTED (A) NOT DETECTED Final    Comment: CRITICAL RESULT CALLED TO, READ BACK BY AND VERIFIED WITH: Hughie Closs PHARMD 09/23/18 1741 JDW    Staphylococcus species NOT DETECTED NOT DETECTED Final   Staphylococcus aureus (BCID) NOT DETECTED NOT DETECTED Final   Methicillin resistance NOT DETECTED NOT DETECTED Final   Streptococcus species NOT DETECTED NOT DETECTED Final   Streptococcus agalactiae NOT DETECTED NOT DETECTED Final   Streptococcus pneumoniae NOT DETECTED NOT DETECTED Final   Streptococcus pyogenes NOT DETECTED NOT DETECTED Final   Acinetobacter baumannii NOT DETECTED NOT DETECTED Final   Enterobacteriaceae species NOT DETECTED NOT DETECTED Final   Enterobacter cloacae complex NOT DETECTED NOT DETECTED Final   Escherichia coli NOT DETECTED NOT DETECTED Final   Klebsiella oxytoca NOT DETECTED NOT DETECTED Final   Klebsiella pneumoniae NOT DETECTED NOT DETECTED Final   Proteus species NOT DETECTED NOT DETECTED Final   Serratia marcescens NOT DETECTED NOT DETECTED Final   Carbapenem resistance NOT DETECTED NOT DETECTED Final   Haemophilus influenzae NOT DETECTED NOT DETECTED Final   Neisseria meningitidis NOT DETECTED NOT DETECTED Final   Pseudomonas aeruginosa NOT DETECTED NOT DETECTED Final   Candida albicans NOT DETECTED NOT DETECTED Final   Candida glabrata NOT DETECTED NOT DETECTED Final   Candida krusei NOT DETECTED NOT DETECTED Final   Candida parapsilosis NOT DETECTED NOT DETECTED Final   Candida tropicalis NOT DETECTED NOT DETECTED Final    Comment: Performed at [facility], [place]. [address], [place], [place] [address]  Culture, blood (routine x 2)     Status: Abnormal   Collection Time: 09/22/18   7:47 PM  Result Value Ref Range Status   Specimen Description BLOOD LEFT ANTECUBITAL  Final   Special Requests   Final    BOTTLES DRAWN AEROBIC ONLY Blood Culture adequate volume   Culture  Setup Time   Final    AEROBIC BOTTLE ONLY GRAM VARIABLE ROD CRITICAL VALUE NOTED.  VALUE IS CONSISTENT WITH PREVIOUSLY REPORTED AND CALLED VALUE.    Culture (A)  Final    LISTERIA MONOCYTOGENES Standardized  susceptibility testing for this organism is not available. HEALTH DEPARTMENT NOTIFIED Performed at [facility], [place] [address] [place], [name] [address]    Report Status 09/25/2018 FINAL  Final  SARS Coronavirus 2 (CEPHEID - Performed in [place] hospital lab), Hosp Order     Status: None   Collection Time: 09/22/18  7:47 PM  Result Value Ref Range Status   SARS Coronavirus 2 NEGATIVE NEGATIVE Final    Comment: (NOTE) If result is NEGATIVE SARS-CoV-2 target nucleic acids are NOT DETECTED. The SARS-CoV-2 RNA is generally detectable in upper and lower  respiratory specimens during the acute phase of infection. The lowest  concentration of SARS-CoV-2 viral copies this assay can detect is 250  copies / mL. A negative result does not preclude SARS-CoV-2 infection  and should not be used as the sole basis for treatment or other  patient management decisions.  A negative result may occur with  improper specimen collection / handling, submission of specimen other  than nasopharyngeal swab, presence of viral mutation(s) within the  areas targeted by this assay, and inadequate number of viral copies  (<250 copies / mL). A negative result must be combined with clinical  observations, patient history, and epidemiological information. If result is POSITIVE SARS-CoV-2 target nucleic acids are DETECTED. The SARS-CoV-2 RNA is generally detectable in upper and lower  respiratory specimens dur ing the acute phase of infection.  Positive  results are indicative of active infection with  SARS-CoV-2.  Clinical  correlation with patient history and other diagnostic information is  necessary to determine patient infection status.  Positive results do  not rule out bacterial infection or co-infection with other viruses. If result is PRESUMPTIVE POSTIVE SARS-CoV-2 nucleic acids MAY BE PRESENT.   A presumptive positive result was obtained on the submitted specimen  and confirmed on repeat testing.  While 2019 novel coronavirus  (SARS-CoV-2) nucleic acids may be present in the submitted sample  additional confirmatory testing may be necessary for epidemiological  and / or clinical management purposes  to differentiate between  SARS-CoV-2 and other Sarbecovirus currently known to infect humans.  If clinically indicated additional testing with an alternate test  methodology [phone]) is advised. The SARS-CoV-2 RNA is generally  detectable in upper and lower respiratory sp ecimens during the acute  phase of infection. The expected result is Negative. Fact Sheet for Patients:  StrictlyIdeas.no Fact Sheet for Healthcare Providers: BankingDealers.co.za This test is not yet approved or cleared by the [place] FDA and has been authorized for detection and/or diagnosis of SARS-CoV-2 by FDA under an Emergency Use Authorization (EUA).  This EUA will remain in effect (meaning this test can be used) for the duration of the COVID-19 declaration under Section 564(b)(1) of the Act, 21 U.S.C. section 360bbb-3(b)(1), unless the authorization is terminated or revoked sooner. Performed at [facility], [place] [address], [place], [place] [address]   MRSA PCR Screening     Status: None   Collection Time: 09/23/18  2:31 AM  Result Value Ref Range Status   MRSA by PCR NEGATIVE NEGATIVE Final    Comment:        The GeneXpert MRSA Assay (FDA approved for NASAL specimens only), is one component of a comprehensive MRSA  colonization surveillance program. It is not intended to diagnose MRSA infection nor to guide or monitor treatment for MRSA infections. Performed at [facility], [place] [address], [place],  [address]   Culture, blood (routine x  2)     Status: None (Preliminary result)   Collection Time: 09/25/18  7:32 AM  Result Value Ref Range Status   Specimen Description BLOOD RIGHT HAND  Final   Special Requests   Final    BOTTLES DRAWN AEROBIC ONLY Blood Culture adequate volume   Culture   Final    NO GROWTH 4 DAYS Performed at [facility], [place] [address], [place], [place] [address]    Report Status PENDING  Incomplete  Culture, blood (routine x 2)     Status: None (Preliminary result)   Collection Time: 09/25/18  7:32 AM  Result Value Ref Range Status   Specimen Description BLOOD RIGHT HAND  Final   Special Requests   Final    BOTTLES DRAWN AEROBIC ONLY Blood Culture adequate volume   Culture   Final    NO GROWTH 4 DAYS Performed at [facility], [place] [address] [place], [place] [address]    Report Status PENDING  Incomplete  SARS Coronavirus 2 (CEPHEID - Performed in [place] hospital lab), Hosp Order     Status: None   Collection Time: 09/28/18  2:25 AM  Result Value Ref Range Status   SARS Coronavirus 2 NEGATIVE NEGATIVE Final    Comment: (NOTE) If result is NEGATIVE SARS-CoV-2 target nucleic acids are NOT DETECTED. The SARS-CoV-2 RNA is generally detectable in upper and lower  respiratory specimens during the acute phase of infection. The lowest  concentration of SARS-CoV-2 viral copies this assay can detect is 250  copies / mL. A negative result does not preclude SARS-CoV-2 infection  and should not be used as the sole basis for treatment or other  patient management decisions.  A negative result may occur with  improper specimen collection / handling, submission of specimen other  than nasopharyngeal swab, presence of viral mutation(s) within the   areas targeted by this assay, and inadequate number of viral copies  (<250 copies / mL). A negative result must be combined with clinical  observations, patient history, and epidemiological information. If result is POSITIVE SARS-CoV-2 target nucleic acids are DETECTED. The SARS-CoV-2 RNA is generally detectable in upper and lower  respiratory specimens dur ing the acute phase of infection.  Positive  results are indicative of active infection with SARS-CoV-2.  Clinical  correlation with patient history and other diagnostic information is  necessary to determine patient infection status.  Positive results do  not rule out bacterial infection or co-infection with other viruses. If result is PRESUMPTIVE POSTIVE SARS-CoV-2 nucleic acids MAY BE PRESENT.   A presumptive positive result was obtained on the submitted specimen  and confirmed on repeat testing.  While 2019 novel coronavirus  (SARS-CoV-2) nucleic acids may be present in the submitted sample  additional confirmatory testing may be necessary for epidemiological  and / or clinical management purposes  to differentiate between  SARS-CoV-2 and other Sarbecovirus currently known to infect humans.  If clinically indicated additional testing with an alternate test  methodology [phone]) is advised. The SARS-CoV-2 RNA is generally  detectable in upper and lower respiratory sp ecimens during the acute  phase of infection. The expected result is Negative. Fact Sheet for Patients:  StrictlyIdeas.no Fact Sheet for Healthcare Providers: BankingDealers.co.za This test is not yet approved or cleared by the [place] FDA and has been authorized for detection and/or diagnosis of SARS-CoV-2 by FDA under an Emergency Use Authorization (EUA).  This EUA will remain in effect (meaning this test can be  used) for the duration of the COVID-19 declaration under Section 564(b)(1) of the Act, 21  U.S.C. section 360bbb-3(b)(1), unless the authorization is terminated or revoked sooner. Performed at [facility], [place] [address] [place], [place] [address]          Radiology Studies: Dg Chest Portable 1 View  Result Date: 09/28/2018 CLINICAL DATA:  85 y/o  M; shortness of breath all day. EXAM: PORTABLE CHEST 1 VIEW COMPARISON:  09/25/2018 chest radiograph FINDINGS: Cardiomegaly and 3 lead pacemaker. Post median sternotomy and CABG. Increased diffuse reticular and patchy bibasilar opacities of the lungs. Probable small bilateral pleural effusions. Right PICC catheter tip projects over the lower SVC. No acute osseous abnormality identified. IMPRESSION: Increased diffuse reticular and patchy bibasilar opacities, likely pulmonary edema. Probable small bilateral pleural effusions. Electronically Signed   By: Kristine Garbe M.D.   On: 09/28/2018 02:35        Scheduled Meds:  amiodarone  100 mg Oral Daily   apixaban  2.5 mg Oral BID   B-complex with vitamin C  1 tablet Oral Daily   carvedilol  6.25 mg Oral BID WC   magnesium oxide  400 mg Oral Daily   multivitamin  1 tablet Oral BID   sodium chloride flush  3 mL Intravenous Q12H   vitamin C  1,000 mg Oral Daily   Continuous Infusions:  sodium chloride 250 mL (09/28/18 2123)   ampicillin (OMNIPEN) IV 2 g (09/29/18 0616)     LOS: 1 day     Vernell Leep, MD, FACP, [facility]. [facility]  To contact the attending provider between 7A-7P or the covering provider during after hours 7P-7A, please log into the web site [email or web address] and access using universal [place] password for that web site. If you do not have the password, please call the hospital operator.  09/29/2018, 10:22 AM

## 2018-09-29 NOTE — TOC Initial Note (Signed)
Transition of Care [facility]) - Initial/Assessment Note    Patient Details  Name: Marcus [name] MRN: [id number] Date of Birth: 11-25-1928  Transition of [facility]) CM/SW Contact:    Sherrilyn Rist Phone Number: [phone] 09/29/2018, 11:12 AM  Clinical Narrative:                 09/29/2018- Patient discharged home 09/26/2018 and readmitted with Hypoxia; [place] arranged with [facility] for home IV antibiotic for 3 weeks; CM will continue to follow for progression of care. Mindi Slicker RN,MHA,BSN  09/26/2018 - Clinical Narrative:   Pt will discharge with IV antibiotics.  Medicare.gov list provided - pt chose [facility] for infusion and [facility] for [facility] - both agencies accepted pt. Jim Desanctis RN CM  Expected Discharge Plan: [place] Barriers to Discharge: No Barriers Identified   Patient Goals and CMS Choice Patient states their goals for this hospitalization and ongoing recovery are:: to go home CMS Medicare.gov Compare Post Acute Care list provided to:: Patient Choice offered to / list presented to : Spouse  Expected Discharge Plan and Services Expected Discharge Plan: [place] In-house Referral: NA Discharge Planning Services: CM Consult Post Acute Care Choice: [place] arrangements for the past 2 months: [place]: [place] ([place])        Prior Living Arrangements/Services Living arrangements for the past 2 months: [place] Lives with:: Spouse          Need for Family Participation in Patient Care: Yes (Comment) Care giver support system in place?: Yes (comment) Current home services: Home PT Criminal Activity/Legal Involvement Pertinent to Current Situation/Hospitalization: No - Comment as needed  Activities of Daily Living      Permission Sought/Granted                  Emotional Assessment         Alcohol / Substance Use:  Not Applicable Psych Involvement: No (comment)  Admission diagnosis:  Hyperkalemia [E87.5] Acute pulmonary edema (HCC) [O27.7] Metabolic acidosis [A12.8] AKI (acute kidney injury) ([place]) [N17.9] Patient Active Problem List   Diagnosis Date Noted  . Hypoxia 09/28/2018  . Hyperkalemia   . Acute respiratory failure with hypoxia ([place])   . Acute respiratory distress   . Palliative care by specialist   . DNR (do not resuscitate) discussion   . Weakness generalized   . Bacteremia   . Hypotension 09/23/2018  . CAP (community acquired pneumonia) 09/23/2018  . Elevated troponin 09/23/2018  . AKI (acute kidney injury) ([place]) 09/23/2018  . CKD (chronic kidney disease) stage 4, GFR 15-29 ml/min (HCC) 09/23/2018  . Frequent PVCs 02/23/2015  . Chronic systolic CHF (congestive heart failure) ([place])   . 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 CKD (chronic kidney disease) stage 3, GFR 30-59 ml/min (HCC) 11/25/2013  . Pacemaker pocket hematoma 10/22/2013  . Acute on chronic systolic heart failure ([place]) 10/06/2013  . H/O noncompliance with medical treatment, presenting hazards to health 08/31/2013  . Peripheral vascular disease, unspecified ([place]) 08/19/2013  . Arterial embolism of right leg ([place]) 07/30/2013  . Ischemic leg 07/29/2013  . Chronic systolic heart failure ([place]) 11/20/2012  . Paroxysmal atrial flutter ([place]) 06/29/2012  . Acute on chronic systolic CHF (congestive heart failure) ([place]) 06/28/2012  . Ischemic  cardiomyopathy 06/28/2012  . Hyperlipidemia 06/28/2012  . Normocytic anemia 06/28/2012  . Anemia 06/25/2012  . Pacemaker-Medtronic 02/01/2012  . HTN (hypertension) 02/28/2011  . Essential hypertension, benign 06/16/2010  . CAD 06/16/2010  . Atrial fibrillation ([place]) 06/16/2010  . SICK SINUS SYNDROME 06/16/2010   PCP:  Patient, No Pcp Per Pharmacy:   [facility] [address] [place] [address] [place] [place] [address] Phone:  [phone] Fax: [phone]  [facility] [place] [facility] [address] [place] [address] Phone: [phone] Fax: [phone]     Social Determinants of Health (SDOH) Interventions    Readmission Risk Interventions Readmission Risk Prevention Plan 09/25/2018  Transportation Screening Complete  PCP or Specialist Appt within 3-5 Days Complete  HRI or [place] Complete  Social Work Consult for [place] Planning/Counseling Complete  Palliative Care Screening Not Applicable  Medication Review Press photographer) Complete  Some recent data might be hidden

## 2018-09-30 ENCOUNTER — Telehealth (HOSPITAL_COMMUNITY): Payer: Self-pay

## 2018-09-30 DIAGNOSIS — N289 Disorder of kidney and ureter, unspecified: Secondary | ICD-10-CM

## 2018-09-30 DIAGNOSIS — R570 Cardiogenic shock: Secondary | ICD-10-CM

## 2018-09-30 DIAGNOSIS — G9341 Metabolic encephalopathy: Secondary | ICD-10-CM

## 2018-09-30 DIAGNOSIS — N189 Chronic kidney disease, unspecified: Secondary | ICD-10-CM

## 2018-09-30 DIAGNOSIS — E860 Dehydration: Secondary | ICD-10-CM

## 2018-09-30 DIAGNOSIS — N19 Unspecified kidney failure: Secondary | ICD-10-CM

## 2018-09-30 LAB — CULTURE, BLOOD (ROUTINE X 2)
Culture: NO GROWTH
Culture: NO GROWTH
Special Requests: ADEQUATE
Special Requests: ADEQUATE

## 2018-09-30 LAB — GLUCOSE, CAPILLARY: Glucose-Capillary: 107 mg/dL — ABNORMAL HIGH (ref 70–99)

## 2018-09-30 LAB — BASIC METABOLIC PANEL
Anion gap: 21 — ABNORMAL HIGH (ref 5–15)
BUN: 106 mg/dL — ABNORMAL HIGH (ref 8–23)
CO2: 21 mmol/L — ABNORMAL LOW (ref 22–32)
Calcium: 8.6 mg/dL — ABNORMAL LOW (ref 8.9–10.3)
Chloride: 96 mmol/L — ABNORMAL LOW (ref 98–111)
Creatinine, Ser: 4.91 mg/dL — ABNORMAL HIGH (ref 0.61–1.24)
GFR calc Af Amer: 11 mL/min — ABNORMAL LOW (ref >60)
GFR calc non Af Amer: 10 mL/min — ABNORMAL LOW (ref >60)
Glucose, Bld: 131 mg/dL — ABNORMAL HIGH (ref 70–99)
Potassium: 4 mmol/L (ref 3.5–5.1)
Sodium: 138 mmol/L (ref 135–145)

## 2018-09-30 LAB — COOXEMETRY PANEL
Carboxyhemoglobin: 1.3 % (ref 0.5–1.5)
Carboxyhemoglobin: 1.4 % (ref 0.5–1.5)
Methemoglobin: 1.6 % — ABNORMAL HIGH (ref 0.0–1.5)
Methemoglobin: 1.7 % — ABNORMAL HIGH (ref 0.0–1.5)
O2 Saturation: 62.3 %
O2 Saturation: 91.5 %
Total hemoglobin: 7.8 g/dL — ABNORMAL LOW (ref 12.0–16.0)
Total hemoglobin: 8.2 g/dL — ABNORMAL LOW (ref 12.0–16.0)

## 2018-09-30 LAB — APTT: aPTT: 150 seconds — ABNORMAL HIGH (ref 24–36)

## 2018-09-30 LAB — HEPARIN LEVEL (UNFRACTIONATED): Heparin Unfractionated: >2.2 IU/mL — ABNORMAL HIGH (ref 0.30–0.70)

## 2018-09-30 MED ORDER — APIXABAN 2.5 MG PO TABS: 2.5000 mg | ORAL_TABLET | Freq: Two times a day (BID) | ORAL | Status: DC

## 2018-09-30 MED ORDER — FUROSEMIDE 10 MG/ML IJ SOLN
120.0000 mg | Freq: Two times a day (BID) | INTRAVENOUS | Status: DC
  Administered 2018-09-30 – 2018-10-01 (×3): 120 mg via INTRAVENOUS
  Filled 2018-09-30: qty 2
  Filled 2018-09-30: qty 12
  Filled 2018-09-30: qty 10
  Filled 2018-09-30: qty 2

## 2018-09-30 MED ORDER — HEPARIN (PORCINE) 25000 UT/250ML-% IV SOLN
1150.0000 [IU]/h | INTRAVENOUS | Status: DC
  Administered 2018-09-30: 1150 [IU]/h via INTRAVENOUS
  Filled 2018-09-30: qty 250

## 2018-09-30 MED ORDER — MIDODRINE HCL 5 MG PO TABS
5.0000 mg | ORAL_TABLET | Freq: Three times a day (TID) | ORAL | Status: DC
  Administered 2018-09-30 – 2018-10-01 (×3): 5 mg via ORAL
  Filled 2018-09-30 (×4): qty 1

## 2018-09-30 NOTE — Progress Notes (Signed)
ANTICOAGULATION CONSULT NOTE - Initial Consult  Pharmacy Consult for heparin Indication: atrial fibrillation  No Known Allergies  Patient Measurements: Height: 5\' 9"  (175.3 cm) Weight: 187 lb 13.3 oz (85.2 kg) IBW/kg (Calculated) : 70.7 Heparin Dosing Weight: 85.2 kg  Vital Signs: Temp: 98 F (36.7 C) (05/12 0700) Temp Source: Oral (05/12 0700) BP: 95/47 (05/12 0450) Pulse Rate: 72 (05/12 0450)  Labs: Recent Labs    09/28/18 0210 09/28/18 0829  09/28/18 2132 09/29/18 0604 09/30/18 0500  HGB 9.6* 9.0*  --   --   --   --   HCT 31.6* 28.5*  --   --   --   --   PLT 260 244  --   --   --   --   CREATININE 4.20* 4.24*   < > 4.46* 4.81* 4.91*   < > = values in this interval not displayed.    Estimated Creatinine Clearance: 11.5 mL/min (A) (by C-G formula based on SCr of 4.91 mg/dL (H)).   Medical History: Past Medical History:  Diagnosis Date  . Atrial flutter ([place])    a. Dx 07/2014, s/p TEE/DCCV.  . Cataracts, bilateral   . Chronic systolic CHF (congestive heart failure) ([place])    a. s/p CRT-D 09/2013. b. Device explant with temp perm then subsequent CRT-P 07/2014.  Marland Kitchen CKD (chronic kidney disease), stage III ([place])   . Coronary artery disease    a. s/p remote CABG x 2(VG->OM, LIMA->LAD;  b. Late 90's s/p PCI of RCA;  c. 12/09 Cath/PCI: LM 87m, 70-80d (3.0x37mm Xience DES), LAD100p, LCX 80-90p (2.25x70mmTaxus Atom DES), RCA 100d (2.5x8mm Xience DES), VG->OM 100, LIMA->LAD nl, EF 30-35%.  Marland Kitchen DVT (deep venous thrombosis) ([place]) 2017   Right knee  . Hard of hearing   . History of colon polyps   . History of gastric ulcer   . History of gout   . History of shingles   . Hyperlipidemia   . Hypertension   . Ischemic cardiomyopathy    a. EF 35-40% by echo 05/2012, b. EF 20-25%, akinesis and scarring of inferolateral, inferior and inferoseptal myocardium, mild AI, mod MR, LA mod dilated, RV mildly dialted and sys fx mildly reduced, RA mildly dilated (09/2013)  c. EF 35-40% by 2D in  07/2014, 25-30% by TEE.  . Macular degeneration, dry   . Pacemaker infection ([place])   . PAF (paroxysmal atrial fibrillation) ([place])   . Peripheral vascular disease ([place])   . Presence of permanent cardiac pacemaker 07/23/2014   [facility] [id number], Serial [id number]  . Prostate cancer ([place])   . Symptomatic bradycardia    a. 08/2003 s/p MDT Enpulse [id number] Dual chamber PPM ser # [id number] H. b. Device explant due to infection/temp perm insertion/PPM 07/2014.    Medications:  Scheduled:  . amiodarone  100 mg Oral Daily  . B-complex with vitamin C  1 tablet Oral Daily  . midodrine  5 mg Oral TID WC  . multivitamin  1 tablet Oral BID  . sodium chloride flush  3 mL Intravenous Q12H  . sodium zirconium cyclosilicate  10 g Oral BID  . vitamin C  1,000 mg Oral Daily    Assessment: 47 yom presenting with AKI, on apixaban PTA for atrial fibrillation. Last dose on 5/11@2205 . Scr up to 4.91, on inotropes and diuretics.   CBC has been stable this admission. Given recent apixaban dosing, will monitor both aPTT and heparin level until correlate. No s/sx of bleeding.   Goal of  Therapy:  Heparin level 0.3-0.7 units/ml aPTT 66-102 seconds Monitor platelets by anticoagulation protocol: Yes   Plan:  Start heparin infusion at 1150 units/hr on 5/12@1100  Check anti-Xa level in 8 hours and daily while on heparin Continue to monitor H&H and platelets  Antonietta Jewel, PharmD, [place] Clinical Pharmacist  Pager: [phone] Phone: [phone] 09/30/2018,10:11 AM

## 2018-09-30 NOTE — Progress Notes (Signed)
Advanced Heart Failure Rounding Note   Subjective:    Moved to SDU last night. CVP initially 18 and Co-ox 35% c/w low output HF.   Started on milrinone and high-dose IV lasix. Co-ox improved to 62%  Moderate diuresis - did not give IV lasix last night  Creatinine leveling off 4.8-4.9. BUN 106  This am confused and lethargic. Mumbling. CVP 15. Potassium down.   Objective:   Weight Range:  Vital Signs:   Temp:  [97.1 F (36.2 C)-98 F (36.7 C)] 98 F (36.7 C) (05/12 0700) Pulse Rate:  [70-79] 72 (05/12 0450) Resp:  [12-20] 18 (05/12 0700) BP: (89-103)/(47-69) 95/47 (05/12 0450) SpO2:  [94 %-99 %] 98 % (05/12 0700) Weight:  [85.2 kg] 85.2 kg (05/12 0454) Last BM Date: 09/26/18  Weight change: Filed Weights   09/28/18 0509 09/29/18 0326 09/30/18 0454  Weight: 84.3 kg 84.6 kg 85.2 kg    Intake/Output:   Intake/Output Summary (Last 24 hours) at 09/30/2018 0957 Last data filed at 09/30/2018 0325 Gross per 24 hour  Intake 212.2 ml  Output 825 ml  Net -612.8 ml     Physical Exam: General:  Elderly ill appearing. Lying flat in bed mumbling  HEENT: normal Neck: supple. JVP to ear. Carotids 2+ bilat; no bruits. No lymphadenopathy or thryomegaly appreciated. Cor: PMI nondisplaced. Regular rate & rhythm. 2/6 MR Lungs: clear Abdomen: soft, nontender, + distended. No hepatosplenomegaly. No bruits or masses. Good bowel sounds. Extremities: no cyanosis, clubbing, rash, 1+ edema Neuro: awake confused    Telemetry: Sinus 70-80s Personally reviewed   Labs: Basic Metabolic Panel: Recent Labs  Lab 09/28/18 0829 09/28/18 1509 09/28/18 2132 09/29/18 0604 09/30/18 0500  NA 134* 134* 134* 135 138  K 6.2* 6.7* 5.5* 5.6* 4.0  CL 98 98 99 94* 96*  CO2 19* 20* 19* 21* 21*  GLUCOSE 122* 114* 136* 95 131*  BUN 86* 91* 91* 95* 106*  CREATININE 4.24* 4.27* 4.46* 4.81* 4.91*  CALCIUM 9.0 9.0 8.9 8.8* 8.6*  MG  --   --   --  2.7*  --     Liver Function Tests: No results  for input(s): AST, ALT, ALKPHOS, BILITOT, PROT, ALBUMIN in the last 168 hours. No results for input(s): LIPASE, AMYLASE in the last 168 hours. No results for input(s): AMMONIA in the last 168 hours.  CBC: Recent Labs  Lab 09/24/18 0229 09/25/18 0552 09/26/18 0945 09/28/18 0210 09/28/18 0829  WBC 7.0 6.0 5.7 7.6 11.6*  NEUTROABS 5.7 4.8 4.4  --   --   HGB 9.9* 9.4* 8.8* 9.6* 9.0*  HCT 31.3* 29.4* 27.6* 31.6* 28.5*  MCV 91.8 90.5 90.2 95.5 91.6  PLT 181 172 195 260 244    Cardiac Enzymes: Recent Labs  Lab 09/23/18 1708 09/23/18 1930 09/24/18 0229  TROPONINI 0.16* 0.16* 0.15*    BNP: BNP (last 3 results) Recent Labs    06/25/18 1209 09/22/18 1857 09/28/18 0210  BNP 800.3* 1,259.5* 1,315.0*    ProBNP (last 3 results) No results for input(s): PROBNP in the last 8760 hours.    Other results:  Imaging: No results found.   Medications:     Scheduled Medications: . amiodarone  100 mg Oral Daily  . apixaban  2.5 mg Oral BID  . B-complex with vitamin C  1 tablet Oral Daily  . multivitamin  1 tablet Oral BID  . sodium chloride flush  3 mL Intravenous Q12H  . sodium zirconium cyclosilicate  10 g Oral BID  .  vitamin C  1,000 mg Oral Daily    Infusions: . sodium chloride 250 mL (09/29/18 2323)  . ampicillin (OMNIPEN) IV 2 g (09/30/18 0725)  . furosemide    . milrinone 0.25 mcg/kg/min (09/29/18 2204)    PRN Medications: sodium chloride, albuterol   Assessment/Plan:   1. Acute on chronic systolic CHF-> cardiogenic shock:Ischemic cardiomyopathy. Echo in 1/19 showed EF 35-40% (stable from most recent prior). Has [employer] CRT-P device. On device check on 08/06/18, he was appropriately BiV pacing with extremely rare AF, thoracic impedance was low. I recently increased his torsemide to 40 mg daily for volume overload. On admission, he looked  volume overloaded, likely in setting of AKI on CKD IV with Bactrim use.  BNP elevated, pulmonary edema on CXR.  -  Echo 09/28/18 EF 20-25% - Initial co-ox on 5/11 38%. With CVP 18 - Started on milrinone with co-ox 62% - Will continue milrinone and add midodrine for BP support - Start IV lasix (not given last night). Hopefully renal function will recover - Hold carvedilol.  - Off spiro with increased creatinine and potassium. - Not on ACEI/ARB/ARNI with CKD.  2. AKI on CKD, stage CM:[id number] up 4.2 -> 4.4 -> 4.8 with hyperkalemia in setting of Bactrim use and low output HF. - Now on milrinone. Creatinine seems to be leveling out and hopefully may start to recover soon - He is uremic but currently no other clear indications for HD (oxygenation ok, hyperkalemia resolved, not acidotic) - I have d/w Renal. I worry that if we start CVVHD that we will never get his kidneys back. I think it is worth waiting one more day and see what ground we can make up with inotropic support and IV lasix   - He is not candidate for outpatient HD - Hold Eliquis in case he needs HD catheter  3. 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 He sas been started on ampicillin + bactrim with plan for 3 week treatment. He has denied abdominal discomfort/diarrhea, has not been eating out. He was neurologically clear at discharge without localizing CNS symptoms/signs.  ID thought we could hold off on further CNS imaging (MRI, LP) with no signs of meningoencephalitis. Suspect Bactrim may have triggered AKI/hyperkalemia.  - ID following. On ampicillin. Of bactrim.  - COVID negative x 2  4. Atrial fibrillation: Paroxysmal. <1% atrial fibrillation on recent device check. He is currently a-paced.  - Continue amiodarone 100 mg daily. - Hold eliquis. Start heparin  5. CAD:H/o CABG. No chest pain. -Refuses statin. - He is not on ASA given use of Eliquis at homeand stable CAD.   6. Sick sinus syndrome: s/p CRT-P.No change.  D/w his wife and Dr. Aundra Dubin.   CRITICAL CARE Performed by: Glori Bickers  Total critical care  time: 35 minutes  Critical care time was exclusive of separately billable procedures and treating other patients.  Critical care was necessary to treat or prevent imminent or life-threatening deterioration.  Critical care was time spent personally by me (independent of midlevel providers or residents) on the following activities: development of treatment plan with patient and/or surrogate as well as nursing, discussions with consultants, evaluation of patient's response to treatment, examination of patient, obtaining history from patient or surrogate, ordering and performing treatments and interventions, ordering and review of laboratory studies, ordering and review of radiographic studies, pulse oximetry and re-evaluation of patient's condition.    Length of Stay: 2   Glori Bickers MD 09/30/2018, 9:57 AM  Advanced Heart Failure  Team Pager [phone] (M-F; [place])  Please contact [facility] for night-coverage after hours (4p -7a ) and weekends on [email or web address]

## 2018-09-30 NOTE — Progress Notes (Addendum)
PROGRESS NOTE   Marcus [name]  LNL:[id number]    DOB: Jan 30, 1929    DOA: 09/28/2018  PCP: Patient, No Pcp Per   I have briefly reviewed patients previous medical records in [facility].  Brief Narrative:  85 year old married male, PMH of PAF on Eliquis, SSS s/p PPM, ICM (LVEF 40-81%), chronic systolic CHF, CAD s/p CABG & PCI's, stage IV CKD, HLD, HTN,, DVT, Hard of Hearing, recent hospitalization 5/4-5/8 for Listeria bacteremia, suspected pneumonia, acute on chronic systolic CHF, acute on chronic kidney disease, was discharged on ampicillin + high-dose Bactrim through 10/13/2018 and torsemide, presented back to [facility] on 5/10 due to progressive dyspnea, weakness, nausea, vomiting and lower extremity edema.  Admitted for acute on chronic systolic CHF, acute on stage IV chronic kidney disease with hyperkalemia and metabolic acidosis and acute hypoxic respiratory failure.  Consulted cardiology, ID, PMT and nephrology.  Patient deteriorated on 5/11 (worsening creatinine, low EF, persistent volume overload and decreased urine output despite high-dose Lasix), moved to progressive unit, checking CVP/coags, started milrinone drip and high-dose IV Lasix.  Critically ill.  Spouse aware.  Assessment & Plan:   Active Problems:   Acute on chronic systolic CHF (congestive heart failure) (HCC)   Hypoxia   Hyperkalemia   Acute respiratory failure with hypoxia (HCC)   Acute respiratory distress   Palliative care by specialist   DNR (do not resuscitate) discussion   Weakness generalized   1. Acute on chronic systolic CHF/ICM/cardiogenic shock: TTE 05/31/2017: LVEF 35-40%, akinesis of inferolateral and inferior myocardium and grade 3 diastolic dysfunction. Acute CHF likely precipitated by volume overload related to acute on chronic kidney disease from high-dose Bactrim.  Not on Aldactone, ACEI/ARB/ARN I due to CKD.  TTE 5/10: LVEF 20-25% with akinesis multiple LV walls.  Moderate to severe pulmonary  hypertension with severe RV dilatation and systolic dysfunction.  Treated aggressively with high-dose IV Lasix 80 mg every 12 hours since admission with less than expected diuresis. Patient deteriorated on 5/11 (worsening creatinine, low EF, persistent volume overload and decreased urine output despite high-dose Lasix), moved to progressive unit, checking CVP/coags, started milrinone drip and high-dose IV Lasix.  I discussed with Dr. Haroldine Laws, would like to hold off CVVHD for another day and monitor closely with current therapy due to concern that if CVVHD initiated, he will irreversibly become dialysis dependent.  Carvedilol discontinued due to soft blood pressures.  Midodrine added for BP support. 2. Acute on stage IV chronic kidney disease/uremia: Baseline creatinine lately in the high 2/low 3 range.  Creatinine 3.24 at discharge on 5/8.  Presented on 5/10 with the following BMP: Potassium 6.2, bicarbonate 16, BUN 83 and creatinine 4.2.  Multifactorial acute kidney injury but most likely due to high-dose Bactrim, cardiorenal/low output HF.  Bactrim discontinued.  Diuresing for decompensated CHF. Has seen Dr. Madelon Lips once as OP.  Nephrology input appreciated.  Creatinine in the last 48 hours has plateaued in the 4.8-4.9 range.  Patient remains volume overloaded.  He is lethargic and likely uremic.  However no intractable hyperkalemia or metabolic acidosis.  I discussed with Dr. Johnney Ou regarding initiation of CVVHD.  However as discussed with Dr. Haroldine Laws, would like to hold off CVVHD for another day. 3. Acute metabolic encephalopathy/uremia: Management as noted above. NPO until safe to take orally. 4. Hyperkalemia: Presented with potassium of 6.2.  Due to Bactrim, AoCKD and potassium supplements.  Renal diet/low potassium diet.  Potassium peaked to 6.7.  Treated with multiple scheduled doses of Lokelma 10  mg.  Potassium down to 4. 5. Listeria bacteremia: Continue IV ampicillin.  Bactrim discontinued.  SARS coronavirus 2 testing x2: Negative.  Blood cultures x2 from 5/7: Negative to date after 4 days.  ID input appreciated, now on ampicillin monotherapy through 10/13/2018. 6. Essential hypertension: Soft blood pressures.  Carvedilol discontinued.  Midodrine added for blood pressure support. 7. PAF: Currently AV paced and controlled rate.  Currently n.p.o. due to AMS but amiodarone with long half-life and hopefully should be okay.  Carvedilol discontinued.  Eliquis stopped in case he needs an HD line and also due to worsening renal insufficiency.  IV heparin initiated. 8. SSS s/PPM: 9. CAD status post CABG & PCI's: No chest pain reported. 10. Anemia of chronic disease: Stable   DVT prophylaxis: Currently on Eliquis Code Status: Partial code: Wants intubation/mechanical ventilation, BiPAP, ACLS meds but no CPR or shocking. Family Communication: I called and discussed in detail with patient spouse on 5/12, updated critical nature of his illness, progressive deterioration in his condition since yesterday and ongoing care in detail.  She has just discussed his care with Dr. Haroldine Laws.  If it comes to that, she is leaning towards not doing CVVHD but will make final decision when necessary.  I have advised her that if that is the case then she would need to consider comfort care/hospice and she is aware. She is hopeful that he makes recovery with nonaggressive medical management. Disposition: To be determined pending clinical improvement.    Consultants:  Cardiology ID PMT Nephrology  Procedures:  None  Antimicrobials:  IV ampicillin   Subjective: Patient lethargic, barely arousable, very confused, mumbles incomprehensibly, does not follow instructions.  Seen along with PT at bedside who cannot work with him today.  Discussed with RN.  ROS: As above, otherwise negative  Objective:  Vitals:   09/30/18 0323 09/30/18 0450 09/30/18 0454 09/30/18 0700  BP:  (!) 95/47    Pulse:  72     Resp:    18  Temp: (!) 97.3 F (36.3 C)   98 F (36.7 C)  TempSrc: Oral   Oral  SpO2: 98% 97%  98%  Weight:   85.2 kg   Height:   5\' 9"  (1.753 m)     Examination:  General exam: Elderly male, moderately built and frail, ill looking, looks worse than he did over the last 2 days.  Lying supine in bed without distress. Respiratory system: Clear to auscultation anteriorly.  Diminished breath sounds in the bases with few basal crackles.  No rhonchi or wheezing.  No increased work of breathing.  Mild breathing. Cardiovascular system: S1 & S2 heard, RRR. No murmurs, rubs, gallops or clicks.  Trace bilateral ankle edema..  No JVD.  Telemetry personally reviewed: AV paced rhythm. Gastrointestinal system: Abdomen is nondistended, soft and nontender. No organomegaly or masses felt. Normal bowel sounds heard.  Stable Central nervous system: Mental status as noted above. No focal neurological deficits. Extremities: Moves all limbs symmetrically.  Right upper arm PICC line. Skin: No rashes, lesions or ulcers Psychiatry: Judgement and insight impaired. Mood & affect cannot be assessed    Data Reviewed: I have personally reviewed following labs and imaging studies  CBC: Recent Labs  Lab 09/24/18 0229 09/25/18 0552 09/26/18 0945 09/28/18 0210 09/28/18 0829  WBC 7.0 6.0 5.7 7.6 11.6*  NEUTROABS 5.7 4.8 4.4  --   --   HGB 9.9* 9.4* 8.8* 9.6* 9.0*  HCT 31.3* 29.4* 27.6* 31.6* 28.5*  MCV 91.8 90.5 90.2  95.5 91.6  PLT 181 172 195 260 889   Basic Metabolic Panel: Recent Labs  Lab 09/28/18 0829 09/28/18 1509 09/28/18 2132 09/29/18 0604 09/30/18 0500  NA 134* 134* 134* 135 138  K 6.2* 6.7* 5.5* 5.6* 4.0  CL 98 98 99 94* 96*  CO2 19* 20* 19* 21* 21*  GLUCOSE 122* 114* 136* 95 131*  BUN 86* 91* 91* 95* 106*  CREATININE 4.24* 4.27* 4.46* 4.81* 4.91*  CALCIUM 9.0 9.0 8.9 8.8* 8.6*  MG  --   --   --  2.7*  --    Liver Function Tests: No results for input(s): AST, ALT, ALKPHOS, BILITOT,  PROT, ALBUMIN in the last 168 hours. Cardiac Enzymes: Recent Labs  Lab 09/23/18 1708 09/23/18 1930 09/24/18 0229  TROPONINI 0.16* 0.16* 0.15*     Recent Results (from the past 240 hour(s))  Culture, blood (routine x 2)     Status: Abnormal   Collection Time: 09/22/18  7:28 PM  Result Value Ref Range Status   Specimen Description BLOOD RIGHT ANTECUBITAL  Final   Special Requests   Final    BOTTLES DRAWN AEROBIC AND ANAEROBIC Blood Culture adequate volume   Culture  Setup Time (A)  Final    GRAM VARIABLE ROD Organism ID to follow CRITICAL RESULT CALLED TO, READ BACK BY AND VERIFIED WITH: Hughie Closs [facility] 09/23/18 1741 JDW    Culture (A)  Final    LISTERIA MONOCYTOGENES Standardized susceptibility testing for this organism is not available. HEALTH DEPARTMENT NOTIFIED Performed at [facility], [place] [address], [place], [place] [address]    Report Status 09/25/2018 FINAL  Final  Blood Culture ID Panel (Reflexed)     Status: Abnormal   Collection Time: 09/22/18  7:28 PM  Result Value Ref Range Status   Enterococcus species NOT DETECTED NOT DETECTED Final   Vancomycin resistance NOT DETECTED NOT DETECTED Final   Listeria monocytogenes DETECTED (A) NOT DETECTED Final    Comment: CRITICAL RESULT CALLED TO, READ BACK BY AND VERIFIED WITH: Hughie Closs PHARMD 09/23/18 1741 JDW    Staphylococcus species NOT DETECTED NOT DETECTED Final   Staphylococcus aureus (BCID) NOT DETECTED NOT DETECTED Final   Methicillin resistance NOT DETECTED NOT DETECTED Final   Streptococcus species NOT DETECTED NOT DETECTED Final   Streptococcus agalactiae NOT DETECTED NOT DETECTED Final   Streptococcus pneumoniae NOT DETECTED NOT DETECTED Final   Streptococcus pyogenes NOT DETECTED NOT DETECTED Final   Acinetobacter baumannii NOT DETECTED NOT DETECTED Final   Enterobacteriaceae species NOT DETECTED NOT DETECTED Final   Enterobacter cloacae complex NOT DETECTED NOT DETECTED Final   Escherichia coli NOT  DETECTED NOT DETECTED Final   Klebsiella oxytoca NOT DETECTED NOT DETECTED Final   Klebsiella pneumoniae NOT DETECTED NOT DETECTED Final   Proteus species NOT DETECTED NOT DETECTED Final   Serratia marcescens NOT DETECTED NOT DETECTED Final   Carbapenem resistance NOT DETECTED NOT DETECTED Final   Haemophilus influenzae NOT DETECTED NOT DETECTED Final   Neisseria meningitidis NOT DETECTED NOT DETECTED Final   Pseudomonas aeruginosa NOT DETECTED NOT DETECTED Final   Candida albicans NOT DETECTED NOT DETECTED Final   Candida glabrata NOT DETECTED NOT DETECTED Final   Candida krusei NOT DETECTED NOT DETECTED Final   Candida parapsilosis NOT DETECTED NOT DETECTED Final   Candida tropicalis NOT DETECTED NOT DETECTED Final    Comment: Performed at [facility], [place] [address] [place],  [address]  Culture, blood (routine x 2)  Status: Abnormal   Collection Time: 09/22/18  7:47 PM  Result Value Ref Range Status   Specimen Description BLOOD LEFT ANTECUBITAL  Final   Special Requests   Final    BOTTLES DRAWN AEROBIC ONLY Blood Culture adequate volume   Culture  Setup Time   Final    AEROBIC BOTTLE ONLY GRAM VARIABLE ROD CRITICAL VALUE NOTED.  VALUE IS CONSISTENT WITH PREVIOUSLY REPORTED AND CALLED VALUE.    Culture (A)  Final    LISTERIA MONOCYTOGENES Standardized susceptibility testing for this organism is not available. HEALTH DEPARTMENT NOTIFIED Performed at [facility], [place] [address] [place], [place] [address]    Report Status 09/25/2018 FINAL  Final  SARS Coronavirus 2 (CEPHEID - Performed in [place] hospital lab), Hosp Order     Status: None   Collection Time: 09/22/18  7:47 PM  Result Value Ref Range Status   SARS Coronavirus 2 NEGATIVE NEGATIVE Final    Comment: (NOTE) If result is NEGATIVE SARS-CoV-2 target nucleic acids are NOT DETECTED. The SARS-CoV-2 RNA is generally detectable in upper and lower  respiratory specimens during the acute phase of  infection. The lowest  concentration of SARS-CoV-2 viral copies this assay can detect is 250  copies / mL. A negative result does not preclude SARS-CoV-2 infection  and should not be used as the sole basis for treatment or other  patient management decisions.  A negative result may occur with  improper specimen collection / handling, submission of specimen other  than nasopharyngeal swab, presence of viral mutation(s) within the  areas targeted by this assay, and inadequate number of viral copies  (<250 copies / mL). A negative result must be combined with clinical  observations, patient history, and epidemiological information. If result is POSITIVE SARS-CoV-2 target nucleic acids are DETECTED. The SARS-CoV-2 RNA is generally detectable in upper and lower  respiratory specimens dur ing the acute phase of infection.  Positive  results are indicative of active infection with SARS-CoV-2.  Clinical  correlation with patient history and other diagnostic information is  necessary to determine patient infection status.  Positive results do  not rule out bacterial infection or co-infection with other viruses. If result is PRESUMPTIVE POSTIVE SARS-CoV-2 nucleic acids MAY BE PRESENT.   A presumptive positive result was obtained on the submitted specimen  and confirmed on repeat testing.  While 2019 novel coronavirus  (SARS-CoV-2) nucleic acids may be present in the submitted sample  additional confirmatory testing may be necessary for epidemiological  and / or clinical management purposes  to differentiate between  SARS-CoV-2 and other Sarbecovirus currently known to infect humans.  If clinically indicated additional testing with an alternate test  methodology [id number]) is advised. The SARS-CoV-2 RNA is generally  detectable in upper and lower respiratory sp ecimens during the acute  phase of infection. The expected result is Negative. Fact Sheet for Patients:   StrictlyIdeas.no Fact Sheet for Healthcare Providers: BankingDealers.co.za This test is not yet approved or cleared by the [place] FDA and has been authorized for detection and/or diagnosis of SARS-CoV-2 by FDA under an Emergency Use Authorization (EUA).  This EUA will remain in effect (meaning this test can be used) for the duration of the COVID-19 declaration under Section 564(b)(1) of the Act, 21 U.S.C. section 360bbb-3(b)(1), unless the authorization is terminated or revoked sooner. Performed at [facility], [place] [address], [place], [place] [address]   MRSA PCR Screening     Status: None  Collection Time: 09/23/18  2:31 AM  Result Value Ref Range Status   MRSA by PCR NEGATIVE NEGATIVE Final    Comment:        The GeneXpert MRSA Assay (FDA approved for NASAL specimens only), is one component of a comprehensive MRSA colonization surveillance program. It is not intended to diagnose MRSA infection nor to guide or monitor treatment for MRSA infections. Performed at [facility], [place] [address] [place], [place] [address]   Culture, blood (routine x 2)     Status: None (Preliminary result)   Collection Time: 09/25/18  7:32 AM  Result Value Ref Range Status   Specimen Description BLOOD RIGHT HAND  Final   Special Requests   Final    BOTTLES DRAWN AEROBIC ONLY Blood Culture adequate volume   Culture   Final    NO GROWTH 4 DAYS Performed at [facility], [place] [address], [place], [place] [address]    Report Status PENDING  Incomplete  Culture, blood (routine x 2)     Status: None (Preliminary result)   Collection Time: 09/25/18  7:32 AM  Result Value Ref Range Status   Specimen Description BLOOD RIGHT HAND  Final   Special Requests   Final    BOTTLES DRAWN AEROBIC ONLY Blood Culture adequate volume   Culture   Final    NO GROWTH 4 DAYS Performed at [facility], [place] [address] [place],   [address]    Report Status PENDING  Incomplete  SARS Coronavirus 2 (CEPHEID - Performed in [place] hospital lab), Hosp Order     Status: None   Collection Time: 09/28/18  2:25 AM  Result Value Ref Range Status   SARS Coronavirus 2 NEGATIVE NEGATIVE Final    Comment: (NOTE) If result is NEGATIVE SARS-CoV-2 target nucleic acids are NOT DETECTED. The SARS-CoV-2 RNA is generally detectable in upper and lower  respiratory specimens during the acute phase of infection. The lowest  concentration of SARS-CoV-2 viral copies this assay can detect is 250  copies / mL. A negative result does not preclude SARS-CoV-2 infection  and should not be used as the sole basis for treatment or other  patient management decisions.  A negative result may occur with  improper specimen collection / handling, submission of specimen other  than nasopharyngeal swab, presence of viral mutation(s) within the  areas targeted by this assay, and inadequate number of viral copies  (<250 copies / mL). A negative result must be combined with clinical  observations, patient history, and epidemiological information. If result is POSITIVE SARS-CoV-2 target nucleic acids are DETECTED. The SARS-CoV-2 RNA is generally detectable in upper and lower  respiratory specimens dur ing the acute phase of infection.  Positive  results are indicative of active infection with SARS-CoV-2.  Clinical  correlation with patient history and other diagnostic information is  necessary to determine patient infection status.  Positive results do  not rule out bacterial infection or co-infection with other viruses. If result is PRESUMPTIVE POSTIVE SARS-CoV-2 nucleic acids MAY BE PRESENT.   A presumptive positive result was obtained on the submitted specimen  and confirmed on repeat testing.  While 2019 novel coronavirus  (SARS-CoV-2) nucleic acids may be present in the submitted sample  additional confirmatory testing may be necessary for  epidemiological  and / or clinical management purposes  to differentiate between  SARS-CoV-2 and other Sarbecovirus currently known to infect humans.  If clinically indicated additional testing with an  alternate test  methodology [id number]) is advised. The SARS-CoV-2 RNA is generally  detectable in upper and lower respiratory sp ecimens during the acute  phase of infection. The expected result is Negative. Fact Sheet for Patients:  StrictlyIdeas.no Fact Sheet for Healthcare Providers: BankingDealers.co.za This test is not yet approved or cleared by the [place] FDA and has been authorized for detection and/or diagnosis of SARS-CoV-2 by FDA under an Emergency Use Authorization (EUA).  This EUA will remain in effect (meaning this test can be used) for the duration of the COVID-19 declaration under Section 564(b)(1) of the Act, 21 U.S.C. section 360bbb-3(b)(1), unless the authorization is terminated or revoked sooner. Performed at [facility], [place] [address] [place], [place] [address]          Radiology Studies: No results found.      Scheduled Meds: . amiodarone  100 mg Oral Daily  . B-complex with vitamin C  1 tablet Oral Daily  . midodrine  5 mg Oral TID WC  . multivitamin  1 tablet Oral BID  . sodium chloride flush  3 mL Intravenous Q12H  . vitamin C  1,000 mg Oral Daily   Continuous Infusions: . sodium chloride 250 mL (09/29/18 2323)  . ampicillin (OMNIPEN) IV 2 g (09/30/18 0725)  . furosemide    . heparin    . milrinone 0.25 mcg/kg/min (09/29/18 2204)     LOS: 2 days     Vernell Leep, MD, FACP, [facility]. [facility]  To contact the attending provider between 7A-7P or the covering provider during after hours 7P-7A, please log into the web site [email or web address] and access using universal Searchlight password for that web site. If you do not have the password, please call the hospital operator.   09/30/2018, 10:49 AM

## 2018-09-30 NOTE — Progress Notes (Signed)
[facility] Progress Note   Interval events:   Transferred overnight - data c/w low CO and vol OL (CVP 18, Co-ox 38.5%).  Started milrinone 10pm. His last dose of lasix was 5/10.  Has order for lasix 120 IV BID to start tonight.  I/Os yesterday 212/825.  Co-ox 62% now, CVP 15 this AM.   Objective Vitals:   09/30/18 0323 09/30/18 0450 09/30/18 0454 09/30/18 0700  BP:  (!) 95/47    Pulse:  72    Resp:    18  Temp: (!) 97.3 F (36.3 C)   98 F (36.7 C)  TempSrc: Oral   Oral  SpO2: 98% 97%  98%  Weight:   85.2 kg   Height:       Physical Exam General: lying flat in bed, lethargic, moaning Heart: RRR, JVD is elevated, no rub Lungs: normal WOB, a few ant rales in L lung Abdomen: soft, nontender Extremities: 1+ ankle edema Neuro: arousable, not conversant, mumbles  Additional Objective Labs: Basic Metabolic Panel: Recent Labs  Lab 09/28/18 2132 09/29/18 0604 09/30/18 0500  NA 134* 135 138  K 5.5* 5.6* 4.0  CL 99 94* 96*  CO2 19* 21* 21*  GLUCOSE 136* 95 131*  BUN 91* 95* 106*  CREATININE 4.46* 4.81* 4.91*  CALCIUM 8.9 8.8* 8.6*   Liver Function Tests: No results for input(s): AST, ALT, ALKPHOS, BILITOT, PROT, ALBUMIN in the last 168 hours. No results for input(s): LIPASE, AMYLASE in the last 168 hours. CBC: Recent Labs  Lab 09/24/18 0229 09/25/18 0552 09/26/18 0945 09/28/18 0210 09/28/18 0829  WBC 7.0 6.0 5.7 7.6 11.6*  NEUTROABS 5.7 4.8 4.4  --   --   HGB 9.9* 9.4* 8.8* 9.6* 9.0*  HCT 31.3* 29.4* 27.6* 31.6* 28.5*  MCV 91.8 90.5 90.2 95.5 91.6  PLT 181 172 195 260 244   Blood Culture    Component Value Date/Time   SDES BLOOD RIGHT HAND 09/25/2018 0732   SDES BLOOD RIGHT HAND 09/25/2018 0732   SPECREQUEST  09/25/2018 0732    BOTTLES DRAWN AEROBIC ONLY Blood Culture adequate volume   SPECREQUEST  09/25/2018 0732    BOTTLES DRAWN AEROBIC ONLY Blood Culture adequate volume   CULT  09/25/2018 0732    NO GROWTH 4 DAYS Performed at [facility], [place] [address] [place], [place] [address]    CULT  09/25/2018 0732    NO GROWTH 4 DAYS Performed at [facility], [place] [address] [place], [place] [address]    REPTSTATUS PENDING 09/25/2018 0732   REPTSTATUS PENDING 09/25/2018 0732    Cardiac Enzymes: Recent Labs  Lab 09/23/18 1708 09/23/18 1930 09/24/18 0229  TROPONINI 0.16* 0.16* 0.15*   CBG: Recent Labs  Lab 09/28/18 1620 09/28/18 1648 09/28/18 2109 09/29/18 0620 09/29/18 1131  GLUCAP 117* 212* 123* 93 94   Iron Studies:  Recent Labs    09/28/18 0829  IRON 43*  TIBC 294  FERRITIN 1,089*   @lablastinr3 @ Studies/Results: No results found. Medications: . sodium chloride 250 mL (09/29/18 2323)  . ampicillin (OMNIPEN) IV 2 g (09/30/18 0725)  . furosemide    . milrinone 0.25 mcg/kg/min (09/29/18 2204)   . amiodarone  100 mg Oral Daily  . apixaban  2.5 mg Oral BID  . B-complex with vitamin C  1 tablet Oral Daily  . multivitamin  1 tablet Oral BID  . sodium chloride flush  3 mL Intravenous Q12H  . sodium zirconium cyclosilicate  10  g Oral BID  . vitamin C  1,000 mg Oral Daily    Assessment/Plan: 1.  AKI on CKD4: baseline Cr 3 (HTN + arterionephrosclerosis; Dr. 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  CO improved with addition of milrinone, UOP fair even without diuretic therapy last PM.  Certainly his decreased mental status with BUN > 100 could be uremia related.  Dr. Haroldine Laws has discussed options with his wife and plan is to proceed with medical therapies but should he not diurese or generally improve she would not be in favor of RRT given his overall status.  He certainly wouldn't be a long term dialysis candidate, and I think forgoing a temporary trial of RRT in this setting is also very reasonable.    2.  Hyperkalemia:  Resolved to normal now.    3.  Recent Listeria bacteremia:   No evidence for AIN related to antibiotics.  Ampicillin per primary.   4.  Anemia:  Hb stable ~ 9.  Wouldn't give ESA in this setting.   I don't think I'm adding much to his care and will sign off.  Should I be able to assist in his care moving forward please don't hesitate to call.   Jannifer Hick MD 09/30/2018, 9:58 AM  [facility] Pager: [phone]

## 2018-09-30 NOTE — Progress Notes (Signed)
[place] for Infectious Disease   Reason for visit: Follow up on bacteremia  Interval History: no new complaints, no fever.  Creat remains above 4.  In 2C due to low output HF and started on milrinone and lasix.     Physical Exam: Constitutional:  Vitals:   09/30/18 0450 09/30/18 0700  BP: (!) 95/47   Pulse: 72   Resp:  18  Temp:  98 F (36.7 C)  SpO2: 97% 98%   patient appears in NAD Eyes: anicteric Respiratory: Normal respiratory effort; CTA B Cardiovascular: RRR GI: soft, nt, nd  Review of Systems: Constitutional: negative for fevers and chills Gastrointestinal: negative for nausea and diarrhea Integument/breast: negative for rash  Lab Results  Component Value Date   WBC 11.6 (H) 09/28/2018   HGB 9.0 (L) 09/28/2018   HCT 28.5 (L) 09/28/2018   MCV 91.6 09/28/2018   PLT 244 09/28/2018    Lab Results  Component Value Date   CREATININE 4.91 (H) 09/30/2018   BUN 106 (H) 09/30/2018   NA 138 09/30/2018   K 4.0 09/30/2018   CL 96 (L) 09/30/2018   CO2 21 (L) 09/30/2018    Lab Results  Component Value Date   ALT 13 09/22/2018   AST 20 09/22/2018   ALKPHOS 66 09/22/2018     Microbiology: Recent Results (from the past 240 hour(s))  Culture, blood (routine x 2)     Status: Abnormal   Collection Time: 09/22/18  7:28 PM  Result Value Ref Range Status   Specimen Description BLOOD RIGHT ANTECUBITAL  Final   Special Requests   Final    BOTTLES DRAWN AEROBIC AND ANAEROBIC Blood Culture adequate volume   Culture  Setup Time (A)  Final    GRAM VARIABLE ROD Organism ID to follow CRITICAL RESULT CALLED TO, READ BACK BY AND VERIFIED WITH: Hughie Closs [facility] 09/23/18 1741 JDW    Culture (A)  Final    LISTERIA MONOCYTOGENES Standardized susceptibility testing for this organism is not available. HEALTH DEPARTMENT NOTIFIED Performed at [facility], [place] [address] [place], [place] [address]    Report Status 09/25/2018 FINAL  Final  Blood Culture ID Panel  (Reflexed)     Status: Abnormal   Collection Time: 09/22/18  7:28 PM  Result Value Ref Range Status   Enterococcus species NOT DETECTED NOT DETECTED Final   Vancomycin resistance NOT DETECTED NOT DETECTED Final   Listeria monocytogenes DETECTED (A) NOT DETECTED Final    Comment: CRITICAL RESULT CALLED TO, READ BACK BY AND VERIFIED WITH: Hughie Closs PHARMD 09/23/18 1741 JDW    Staphylococcus species NOT DETECTED NOT DETECTED Final   Staphylococcus aureus (BCID) NOT DETECTED NOT DETECTED Final   Methicillin resistance NOT DETECTED NOT DETECTED Final   Streptococcus species NOT DETECTED NOT DETECTED Final   Streptococcus agalactiae NOT DETECTED NOT DETECTED Final   Streptococcus pneumoniae NOT DETECTED NOT DETECTED Final   Streptococcus pyogenes NOT DETECTED NOT DETECTED Final   Acinetobacter baumannii NOT DETECTED NOT DETECTED Final   Enterobacteriaceae species NOT DETECTED NOT DETECTED Final   Enterobacter cloacae complex NOT DETECTED NOT DETECTED Final   Escherichia coli NOT DETECTED NOT DETECTED Final   Klebsiella oxytoca NOT DETECTED NOT DETECTED Final   Klebsiella pneumoniae NOT DETECTED NOT DETECTED Final   Proteus species NOT DETECTED NOT DETECTED Final   Serratia marcescens NOT DETECTED NOT DETECTED Final   Carbapenem resistance NOT DETECTED NOT DETECTED Final   Haemophilus influenzae NOT DETECTED NOT DETECTED  Final   Neisseria meningitidis NOT DETECTED NOT DETECTED Final   Pseudomonas aeruginosa NOT DETECTED NOT DETECTED Final   Candida albicans NOT DETECTED NOT DETECTED Final   Candida glabrata NOT DETECTED NOT DETECTED Final   Candida krusei NOT DETECTED NOT DETECTED Final   Candida parapsilosis NOT DETECTED NOT DETECTED Final   Candida tropicalis NOT DETECTED NOT DETECTED Final    Comment: Performed at [facility], [place] [address], [place], [place] [address]  Culture, blood (routine x 2)     Status: Abnormal   Collection Time: 09/22/18  7:47 PM  Result Value Ref Range  Status   Specimen Description BLOOD LEFT ANTECUBITAL  Final   Special Requests   Final    BOTTLES DRAWN AEROBIC ONLY Blood Culture adequate volume   Culture  Setup Time   Final    AEROBIC BOTTLE ONLY GRAM VARIABLE ROD CRITICAL VALUE NOTED.  VALUE IS CONSISTENT WITH PREVIOUSLY REPORTED AND CALLED VALUE.    Culture (A)  Final    LISTERIA MONOCYTOGENES Standardized susceptibility testing for this organism is not available. HEALTH DEPARTMENT NOTIFIED Performed at [facility], [place] [address] [place], [place] [address]    Report Status 09/25/2018 FINAL  Final  SARS Coronavirus 2 (CEPHEID - Performed in [place] hospital lab), Hosp Order     Status: None   Collection Time: 09/22/18  7:47 PM  Result Value Ref Range Status   SARS Coronavirus 2 NEGATIVE NEGATIVE Final    Comment: (NOTE) If result is NEGATIVE SARS-CoV-2 target nucleic acids are NOT DETECTED. The SARS-CoV-2 RNA is generally detectable in upper and lower  respiratory specimens during the acute phase of infection. The lowest  concentration of SARS-CoV-2 viral copies this assay can detect is 250  copies / mL. A negative result does not preclude SARS-CoV-2 infection  and should not be used as the sole basis for treatment or other  patient management decisions.  A negative result may occur with  improper specimen collection / handling, submission of specimen other  than nasopharyngeal swab, presence of viral mutation(s) within the  areas targeted by this assay, and inadequate number of viral copies  (<250 copies / mL). A negative result must be combined with clinical  observations, patient history, and epidemiological information. If result is POSITIVE SARS-CoV-2 target nucleic acids are DETECTED. The SARS-CoV-2 RNA is generally detectable in upper and lower  respiratory specimens dur ing the acute phase of infection.  Positive  results are indicative of active infection with SARS-CoV-2.  Clinical  correlation with  patient history and other diagnostic information is  necessary to determine patient infection status.  Positive results do  not rule out bacterial infection or co-infection with other viruses. If result is PRESUMPTIVE POSTIVE SARS-CoV-2 nucleic acids MAY BE PRESENT.   A presumptive positive result was obtained on the submitted specimen  and confirmed on repeat testing.  While 2019 novel coronavirus  (SARS-CoV-2) nucleic acids may be present in the submitted sample  additional confirmatory testing may be necessary for epidemiological  and / or clinical management purposes  to differentiate between  SARS-CoV-2 and other Sarbecovirus currently known to infect humans.  If clinically indicated additional testing with an alternate test  methodology [phone] is advised. The SARS-CoV-2 RNA is generally  detectable in upper and lower respiratory sp ecimens during the acute  phase of infection. The expected result is Negative. Fact Sheet for Patients:  StrictlyIdeas.no Fact Sheet for Healthcare Providers: BankingDealers.co.za This test is not  yet approved or cleared by the [place] and has been authorized for detection and/or diagnosis of SARS-CoV-2 by FDA under an Emergency Use Authorization (EUA).  This EUA will remain in effect (meaning this test can be used) for the duration of the COVID-19 declaration under Section 564(b)(1) of the Act, 21 U.S.C. section 360bbb-3(b)(1), unless the authorization is terminated or revoked sooner. Performed at [facility], [place] [address], [place], [place] [address]   MRSA PCR Screening     Status: None   Collection Time: 09/23/18  2:31 AM  Result Value Ref Range Status   MRSA by PCR NEGATIVE NEGATIVE Final    Comment:        The GeneXpert MRSA Assay (FDA approved for NASAL specimens only), is one component of a comprehensive MRSA colonization surveillance program. It is not intended to  diagnose MRSA infection nor to guide or monitor treatment for MRSA infections. Performed at [facility], [place] [address] [place], [place] [address]   Culture, blood (routine x 2)     Status: None (Preliminary result)   Collection Time: 09/25/18  7:32 AM  Result Value Ref Range Status   Specimen Description BLOOD RIGHT HAND  Final   Special Requests   Final    BOTTLES DRAWN AEROBIC ONLY Blood Culture adequate volume   Culture   Final    NO GROWTH 4 DAYS Performed at [facility], [place] [address], [place], [place] [address]    Report Status PENDING  Incomplete  Culture, blood (routine x 2)     Status: None (Preliminary result)   Collection Time: 09/25/18  7:32 AM  Result Value Ref Range Status   Specimen Description BLOOD RIGHT HAND  Final   Special Requests   Final    BOTTLES DRAWN AEROBIC ONLY Blood Culture adequate volume   Culture   Final    NO GROWTH 4 DAYS Performed at [facility], [place] [address], [place], [place] [address]    Report Status PENDING  Incomplete  SARS Coronavirus 2 (CEPHEID - Performed in [place] hospital lab), Hosp Order     Status: None   Collection Time: 09/28/18  2:25 AM  Result Value Ref Range Status   SARS Coronavirus 2 NEGATIVE NEGATIVE Final    Comment: (NOTE) If result is NEGATIVE SARS-CoV-2 target nucleic acids are NOT DETECTED. The SARS-CoV-2 RNA is generally detectable in upper and lower  respiratory specimens during the acute phase of infection. The lowest  concentration of SARS-CoV-2 viral copies this assay can detect is 250  copies / mL. A negative result does not preclude SARS-CoV-2 infection  and should not be used as the sole basis for treatment or other  patient management decisions.  A negative result may occur with  improper specimen collection / handling, submission of specimen other  than nasopharyngeal swab, presence of viral mutation(s) within the  areas targeted by this assay, and inadequate number of viral  copies  (<250 copies / mL). A negative result must be combined with clinical  observations, patient history, and epidemiological information. If result is POSITIVE SARS-CoV-2 target nucleic acids are DETECTED. The SARS-CoV-2 RNA is generally detectable in upper and lower  respiratory specimens dur ing the acute phase of infection.  Positive  results are indicative of active infection with SARS-CoV-2.  Clinical  correlation with patient history and other diagnostic information is  necessary to determine patient infection status.  Positive results do  not rule  out bacterial infection or co-infection with other viruses. If result is PRESUMPTIVE POSTIVE SARS-CoV-2 nucleic acids MAY BE PRESENT.   A presumptive positive result was obtained on the submitted specimen  and confirmed on repeat testing.  While 2019 novel coronavirus  (SARS-CoV-2) nucleic acids may be present in the submitted sample  additional confirmatory testing may be necessary for epidemiological  and / or clinical management purposes  to differentiate between  SARS-CoV-2 and other Sarbecovirus currently known to infect humans.  If clinically indicated additional testing with an alternate test  methodology [phone]) is advised. The SARS-CoV-2 RNA is generally  detectable in upper and lower respiratory sp ecimens during the acute  phase of infection. The expected result is Negative. Fact Sheet for Patients:  StrictlyIdeas.no Fact Sheet for Healthcare Providers: BankingDealers.co.za This test is not yet approved or cleared by the [place] FDA and has been authorized for detection and/or diagnosis of SARS-CoV-2 by FDA under an Emergency Use Authorization (EUA).  This EUA will remain in effect (meaning this test can be used) for the duration of the COVID-19 declaration under Section 564(b)(1) of the Act, 21 U.S.C. section 360bbb-3(b)(1), unless the authorization is terminated  or revoked sooner. Performed at [facility], 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  No real option at this point for combined therapy.  Seems to be doing well from this standpoint.  Continue ampicillin through May 25.    2.  Renal insufficiency - underlying renal disease.  Dehydrated and Bactrim for a short period as well.  Off of Bactrim now.    Will continue to follow intermittently.

## 2018-09-30 NOTE — Evaluation (Signed)
Physical Therapy Evaluation Patient Details Name: Marcus [name] MRN: [id number] DOB: 1928/11/29 Today's Date: 09/30/2018   History of Present Illness  Pt is an 85 y.o. male admitted 09/28/18 with SOB and worsening weakness; worked up for acute hypoxic respiratory failure due to acute decompensated HF. CXR with likely pulmonary edema. 5/11 pt with consistent volume overload, may require CVVHD per Heart Failure note. Of note, recent admission 09/22/18-09/26/18 with Listeria bactermeia d/c home on antimicrobial regimen. PMH includes afib, SSS s/p pacemaker, ischemic cardiomyopathy, HF, CAD s/p CABG, CKD IV.    Clinical Impression  Patient evaluated by Physical Therapy; currently confused and disoriented, not following commands, requiring totalA for mobility. Prior to initial admission 09/2018, pt indep and lives with wife, although has become weaker since d/c home. Expect pt to require post-acute rehab at d/c. Spoke with MD requesting PT be reconsulted when patient more appropriate to participate. Acute PT is signing off for now. Thank you for this referral.    Follow Up Recommendations (will likely require SNF)    Equipment Recommendations  (TBD)    Recommendations for Other Services       Precautions / Restrictions Precautions Precautions: Fall Precaution Comments: AMS/confused Restrictions Weight Bearing Restrictions: No      Mobility  Bed Mobility Overal bed mobility: Needs Assistance             General bed mobility comments: TotalA for repositioning in bed; pt not following commands or assisting with movement beyond reaching for covers  Transfers                    Ambulation/Gait                Stairs            Wheelchair Mobility    Modified Rankin (Stroke Patients Only)       Balance                                             Pertinent Vitals/Pain Pain Assessment: Faces Faces Pain Scale: Hurts a little bit Pain  Location: Generalized Pain Descriptors / Indicators: Grimacing Pain Intervention(s): Monitored during session    Home Living Family/patient expects to be discharged to:: Private residence Living Arrangements: Spouse/significant other Available Help at Discharge: Family;Available 24 hours/day Type of Home: House Home Access: Stairs to enter Entrance Stairs-Rails: None Entrance Stairs-Number of Steps: 3 Home Layout: One level Home Equipment: Shower seat Additional Comments: Home set-up taken from PT/OT evaluation during admission 6 days ago    Prior Function           Comments: Pt confused. Per chart review, pt independent PTA 6 days ago ("indep with bathing, dressing and amb"). Since recent d/c home, has been getting weaker requiring assist to stand, leading to readmission     Hand Dominance   Dominant Hand: Right    Extremity/Trunk Assessment   Upper Extremity Assessment Upper Extremity Assessment: Difficult to assess due to impaired cognition(Gross observation, able to reach down to covers to bring up over shoulders)    Lower Extremity Assessment Lower Extremity Assessment: Difficult to assess due to impaired cognition(Not following commands, gross observation <3/5 strength)       Communication   Communication: HOH  Cognition Arousal/Alertness: Lethargic   Overall Cognitive Status: No family/caregiver present to determine baseline cognitive functioning Area of Impairment: Orientation;Attention;Following  commands;Awareness                 Orientation Level: Disoriented to;Situation;Time;Person;Place Current Attention Level: Focused   Following Commands: Follows one step commands inconsistently   Awareness: Intellectual   General Comments: Pt very confused and disoriented, "I'm not sure if I have a name..." Pt able to speak clear words, but then mumbling in gibberish at times.      General Comments General comments (skin integrity, edema, etc.): Supine  BP 90/47, SpO2 97% on 3L O2 [place]    Exercises     Assessment/Plan    PT Assessment (Patient will likely need continued PT services when medically appropriate and able to participate with acute PT)  PT Problem List Decreased strength;Decreased activity tolerance;Decreased balance;Decreased mobility;Decreased cognition;Decreased knowledge of use of DME;Decreased safety awareness       PT Treatment Interventions      PT Goals (Current goals can be found in the Care Plan section)  Acute Rehab PT Goals PT Goal Formulation: Patient unable to participate in goal setting    Frequency     Barriers to discharge        Co-evaluation               AM-PAC PT "6 Clicks" Mobility  Outcome Measure Help needed turning from your back to your side while in a flat bed without using bedrails?: Total Help needed moving from lying on your back to sitting on the side of a flat bed without using bedrails?: Total Help needed moving to and from a bed to a chair (including a wheelchair)?: Total Help needed standing up from a chair using your arms (e.g., wheelchair or bedside chair)?: Total Help needed to walk in hospital room?: Total Help needed climbing 3-5 steps with a railing? : Total 6 Click Score: 6    End of Session Equipment Utilized During Treatment: Oxygen Activity Tolerance: Patient limited by lethargy;Other (comment)(Limited by confusion) Patient left: in bed;with call bell/phone within reach Nurse Communication: Mobility status PT Visit Diagnosis: Muscle weakness (generalized) (M62.81);Other abnormalities of gait and mobility (R26.89)    Time: 5284-1324 PT Time Calculation (min) (ACUTE ONLY): 14 min   Charges:   PT Evaluation $PT Eval Moderate Complexity: 1 Mod        Mabeline Caras, PT, DPT Acute Rehabilitation Services  Pager [phone] Office [place] 09/30/2018, 10:25 AM

## 2018-09-30 NOTE — Progress Notes (Signed)
No ICM remote transmission received for 09/22/2018 and next ICM transmission scheduled for 10/08/2018.

## 2018-09-30 NOTE — Telephone Encounter (Signed)
PT home health orders reviewed and signed by DM, confirmation received.

## 2018-09-30 NOTE — Telephone Encounter (Signed)
Home Health Cert and POC reviewed and signed by DM, confirmation received.

## 2018-09-30 NOTE — Progress Notes (Signed)
Patient ID: Marcus [name], male   DOB: Aug 11, 1928, 85 y.o.   MRN: [id number]  This NP visited patient at the bedside for palliative medicine needs and emotional support.   Patient is lethargic and confused to place and time.   Patient is weak and  seriously ill with multiple comorbidities.  Continues on antibiotics for Listeria bacteremia, creatinine remains elevated at 4.91, potassium is improved at  4.0.   Currently on progressive unit 2/2 to medical decline, on Milrinone gtt, discussion had regarding initiation of CVVHD ( on hold for now).    Spoke with wife by phone for continued conversation regarding diagnosis, prognosis, GOCs, EOL wishes disposition, options and anticipatory care needs.  Facetimed wife and husband for a few minute face to face conversation.     Wife and I had conversation regarding the seriousness of the current medical situation and the  importance of continued conversation with her husband  and the medical providers regarding overall plan of care and treatment options,  ensuring decisions are within the context of the patients values and GOCs.  Marcus [name] tells me today she remains hopeful for improvement with current medical interventions, however she would  not be open to escalation of care to something like CVVHD.  We discussed the "what ifs"; if patient declines in spite of current medical interventions, decision would be to shift to comfort and dignity.  We discussed residential hospice option.   Questions and concerns addressed   Discussed with Dr Algis Liming  Total time spent on the unit was 35 minutes    PMT will continue to support holistically  Greater than 50% of the time was spent in counseling and coordination of care  Wadie Lessen NP  Palliative Medicine Team Team Phone # [phone] Pager [phone]

## 2018-09-30 NOTE — Progress Notes (Signed)
Patient ID: Marcus [name], male   DOB: 1929-01-27, 85 y.o.   MRN: [id number]  I discussed the plan for [name] with Dr. Haroldine Laws.    Agree with inotrope + high dose Lasix for now, co-ox improved on milrinone/midodrine and he is now making urine.  It is possible that we can stabilize him at a higher "set point" creatinine.  However, I am concerned that he may be nearing ESRD.  He would be a very poor dialysis candidate.  I talked with his wife this afternoon, she says that he would not want dialysis.  I think the plan going forwards should be current treatment, and if this does not stabilize him, aim for hospice care.  His wife and I discussed getting hospice involved if he does not turn around with medical management.   [name] 09/30/2018 3:35 PM

## 2018-10-01 DIAGNOSIS — N185 Chronic kidney disease, stage 5: Secondary | ICD-10-CM

## 2018-10-01 DIAGNOSIS — Z7189 Other specified counseling: Secondary | ICD-10-CM

## 2018-10-01 DIAGNOSIS — Z515 Encounter for palliative care: Secondary | ICD-10-CM

## 2018-10-01 DIAGNOSIS — R0603 Acute respiratory distress: Secondary | ICD-10-CM

## 2018-10-01 DIAGNOSIS — R531 Weakness: Secondary | ICD-10-CM

## 2018-10-01 LAB — CBC
HCT: 22.6 % — ABNORMAL LOW (ref 39.0–52.0)
Hemoglobin: 7.2 g/dL — ABNORMAL LOW (ref 13.0–17.0)
MCH: 29.1 pg (ref 26.0–34.0)
MCHC: 31.9 g/dL (ref 30.0–36.0)
MCV: 91.5 fL (ref 80.0–100.0)
Platelets: 211 10*3/uL (ref 150–400)
RBC: 2.47 MIL/uL — ABNORMAL LOW (ref 4.22–5.81)
RDW: 15.7 % — ABNORMAL HIGH (ref 11.5–15.5)
WBC: 8.7 10*3/uL (ref 4.0–10.5)
nRBC: 2.2 % — ABNORMAL HIGH (ref 0.0–0.2)

## 2018-10-01 LAB — BASIC METABOLIC PANEL
Anion gap: 16 — ABNORMAL HIGH (ref 5–15)
BUN: 114 mg/dL — ABNORMAL HIGH (ref 8–23)
CO2: 21 mmol/L — ABNORMAL LOW (ref 22–32)
Calcium: 7.9 mg/dL — ABNORMAL LOW (ref 8.9–10.3)
Chloride: 102 mmol/L (ref 98–111)
Creatinine, Ser: 5.58 mg/dL — ABNORMAL HIGH (ref 0.61–1.24)
GFR calc Af Amer: 10 mL/min — ABNORMAL LOW (ref >60)
GFR calc non Af Amer: 8 mL/min — ABNORMAL LOW (ref >60)
Glucose, Bld: 111 mg/dL — ABNORMAL HIGH (ref 70–99)
Potassium: 3.7 mmol/L (ref 3.5–5.1)
Sodium: 139 mmol/L (ref 135–145)

## 2018-10-01 LAB — HEPATIC FUNCTION PANEL
ALT: 472 U/L — ABNORMAL HIGH (ref 0–44)
AST: 225 U/L — ABNORMAL HIGH (ref 15–41)
Albumin: 2.4 g/dL — ABNORMAL LOW (ref 3.5–5.0)
Alkaline Phosphatase: 78 U/L (ref 38–126)
Bilirubin, Direct: 0.1 mg/dL (ref 0.0–0.2)
Indirect Bilirubin: 0.8 mg/dL (ref 0.3–0.9)
Total Bilirubin: 0.9 mg/dL (ref 0.3–1.2)
Total Protein: 5.6 g/dL — ABNORMAL LOW (ref 6.5–8.1)

## 2018-10-01 LAB — APTT: aPTT: 166 seconds (ref 24–36)

## 2018-10-01 LAB — COOXEMETRY PANEL
Carboxyhemoglobin: 1.2 % (ref 0.5–1.5)
Methemoglobin: 1.6 % — ABNORMAL HIGH (ref 0.0–1.5)
O2 Saturation: 67.6 %
Total hemoglobin: 10.6 g/dL — ABNORMAL LOW (ref 12.0–16.0)

## 2018-10-01 LAB — HEPARIN LEVEL (UNFRACTIONATED): Heparin Unfractionated: >2.2 IU/mL — ABNORMAL HIGH (ref 0.30–0.70)

## 2018-10-01 MED ORDER — HYDROMORPHONE HCL 1 MG/ML IJ SOLN: 0.2500 mg | INTRAMUSCULAR | Status: DC | PRN

## 2018-10-01 MED ORDER — HALOPERIDOL LACTATE 2 MG/ML PO CONC
0.5000 mg | ORAL | Status: DC | PRN
  Filled 2018-10-01: qty 0.3

## 2018-10-01 MED ORDER — GLYCOPYRROLATE 1 MG PO TABS
1.0000 mg | ORAL_TABLET | ORAL | Status: DC | PRN
  Filled 2018-10-01: qty 1

## 2018-10-01 MED ORDER — ACETAMINOPHEN 650 MG RE SUPP: 650.0000 mg | Freq: Four times a day (QID) | RECTAL | Status: DC | PRN

## 2018-10-01 MED ORDER — HALOPERIDOL LACTATE 5 MG/ML IJ SOLN: 0.5000 mg | INTRAMUSCULAR | Status: DC | PRN

## 2018-10-01 MED ORDER — HALOPERIDOL 0.5 MG PO TABS
0.5000 mg | ORAL_TABLET | ORAL | Status: DC | PRN
  Filled 2018-10-01: qty 1

## 2018-10-01 MED ORDER — POLYVINYL ALCOHOL 1.4 % OP SOLN
1.0000 [drp] | Freq: Four times a day (QID) | OPHTHALMIC | Status: DC | PRN
  Filled 2018-10-01: qty 15

## 2018-10-01 MED ORDER — GLYCOPYRROLATE 0.2 MG/ML IJ SOLN: 0.2000 mg | INTRAMUSCULAR | Status: DC | PRN

## 2018-10-01 MED ORDER — BIOTENE DRY MOUTH MT LIQD: 15.0000 mL | OROMUCOSAL | Status: DC | PRN

## 2018-10-01 MED ORDER — ACETAMINOPHEN 325 MG PO TABS: 650.0000 mg | ORAL_TABLET | Freq: Four times a day (QID) | ORAL | Status: DC | PRN

## 2018-10-01 MED ORDER — HEPARIN (PORCINE) 25000 UT/250ML-% IV SOLN
950.0000 [IU]/h | INTRAVENOUS | Status: DC
  Filled 2018-10-01: qty 250

## 2018-10-01 MED ORDER — SODIUM CHLORIDE 0.9 % IV SOLN
2.0000 g | Freq: Two times a day (BID) | INTRAVENOUS | Status: DC
  Filled 2018-10-01: qty 2000

## 2018-10-01 NOTE — Progress Notes (Signed)
Consulted with [place] again regarding low BP reading per MD orders. [place] advised to get manual pressure on patient. Measurements were advised to be done 5 minutes apart. MD to be called if SBP in 70's AND DBP <45 in both readings.  MD paged with updated readings.

## 2018-10-01 NOTE — Progress Notes (Signed)
I responded to a [place] to provide spiritual support for the patient. I visited the patient's room and the patient was resting. I prayed for the patient while I was in the room with him. The Chaplain is available for additional support as needed or requested.    10/01/18 1000  Clinical Encounter Type  Visited With Patient  Visit Type Spiritual support  Referral From Nurse  Consult/Referral To Chaplain  Spiritual Encounters  Spiritual Needs Prayer    Chaplain Dr Redgie Grayer

## 2018-10-01 NOTE — TOC Transition Note (Signed)
Transition of Care [facility]) - CM/SW Discharge Note   Patient Details  Name: Marcus [name] MRN: [id number] Date of Birth: 1928/10/05  [facility] CM/SW Contact:  Vinie Sill, [place] Phone Number: 10/01/2018, 1:03 PM   Clinical Narrative:    Patient will DC to: [place] Date: 10/01/2018 Family Notified: [name], spouse  Transport By: Corey Harold  RN, patient, and facility notified of DC. Discharge Summary sent to facility. RN given number for report [phone].Ambulance transport requested for patient.   Clinical Social Worker signing off. Thurmond Butts, MSW, [facility] Clinical Social Worker [phone]     Final next level of care: [place] Barriers to Discharge: No Barriers Identified   Patient Goals and CMS Choice Patient states their goals for this hospitalization and ongoing recovery are:: to go home CMS Medicare.gov Compare Post Acute Care list provided to:: Patient Choice offered to / list presented to : Spouse  Discharge Placement              Patient [facility] bed at: [facility] ) Patient to be transferred to facility by: [place] Name of family member notified: [name] Patient and family notified of of transfer: 10/01/18  Discharge Plan and Services In-house Referral: NA Discharge Planning Services: CM Consult Post Acute Care Choice: Home Health            DME Agency: NA         [facility] Agency: Hospice and [place] Determinants of Health ([place]) Interventions     Readmission Risk Interventions Readmission Risk Prevention Plan 09/30/2018 09/25/2018  Transportation Screening Not Complete Complete  Transportation Screening Comment see above -  PCP or Specialist Appt within 3-5 Days Not Complete Complete  HRI or Home Care Consult Not Complete Complete  HRI or Home Care Consult comments ongoing -  Social Work Consult for [place] Planning/Counseling Complete  Complete  Palliative Care Screening (No Data) Not Applicable  Medication Review [employer] photographer) - Complete  Some recent data might be hidden

## 2018-10-01 NOTE — Progress Notes (Signed)
Patient's blood pressures have been running soft all shift. Day shift relayed that they have also been running soft with them.  Patient remains on milrinone gtt at 0.25 mcg. Patient's BP has been monitored  Every 30 minutes for the shift. SBP ranges from 90's-70's with DBP: 40's-50's.with MAP: 50's-60's. Patient is warm to touch and still producing some urine.  Consulted with [facility] regarding pressures. Recommended drawing AM labs for more information on patient status. Treatment will be based on lab results.  Will proceed with recommendations now and call with further updates.

## 2018-10-01 NOTE — Progress Notes (Addendum)
Morning labs drawn early per MD orders.  Morning co-ox: 67.6.  [place] alerted of results and BP.  Patient is still warm to the touch. Patient has put out little urine since 300 mL emptied at 2300. SBP remains in 70;s-80's with DBP: 40's-30's. MAP remains in 50's.   0239 Addendum: Verified with [place] course of action. Okay with current pressures and urine output.  MD wants pt to remain on milrinone and to continue monitoring pressures, urine output, and temperature frequently.  Verified that MD would like to be notified when SBP remains in 70's following two blood pressure readings and/or when DBP drops below 45.  Will continue to closely monitor patient's condition.

## 2018-10-01 NOTE — Progress Notes (Signed)
Daily Progress Note   Patient Name: Marcus [name]       Date: 10/01/2018 DOB: 28-Jan-1929  Age: 85 y.o. MRN#: [id number] Attending Physician: Cristal Ford, DO Primary Care Physician: Patient, No Pcp Per Admit Date: 09/28/2018  Reason for Consultation/Follow-up: Establishing goals of care and Psychosocial/spiritual support  Subjective: Mr. Pettinger is confused.  He is able to give [place] his name and location, but asks why am I here?  His speech is slurred.  He requests a coke.  Discussed with bedside RN and Dr. Linus Salmons.  Called HF.  Per Caryl Pina, Dr. Haroldine Laws has transitioned the patient to comfort and will disconnect the ICD.   Assessment: End stage renal failure, advanced heart failure.  [place], hypotensive.  Family does not want CVVHD   Length of Stay: 3  Current Medications: Scheduled Meds:  . amiodarone  100 mg Oral Daily  . B-complex with vitamin C  1 tablet Oral Daily  . midodrine  5 mg Oral TID WC  . multivitamin  1 tablet Oral BID  . vitamin C  1,000 mg Oral Daily    Continuous Infusions: . sodium chloride Stopped (09/30/18 0725)  . ampicillin (OMNIPEN) IV    . furosemide 120 mg (10/01/18 0913)  . heparin 950 Units/hr (10/01/18 0200)  . milrinone 0.25 mcg/kg/min (10/01/18 0247)    PRN Meds: sodium chloride, albuterol  Physical Exam       Took a sip of diet coke and coughing immediate ensued. Elderly lethargic male, awake, confused, NAD CV rrr Resp no distress, Wheeze on expiration. Abdomen soft, nt, nd, LE no edema.  Vital Signs: BP (!) 82/39 (BP Location: Left Arm)   Pulse 70   Temp 97.9 F (36.6 C) (Axillary)   Resp 15   Ht 5\' 9"  (1.753 m)   Wt 84.2 kg   SpO2 98%   BMI 27.41 kg/m  SpO2: SpO2: 98 % O2 Device: O2 Device: Nasal Cannula O2 Flow Rate:  O2 Flow Rate (L/min): 3 L/min  Intake/output summary:   Intake/Output Summary (Last 24 hours) at 10/01/2018 0934 Last data filed at 10/01/2018 0200 Gross per 24 hour  Intake 1440.55 ml  Output 450 ml  Net 990.55 ml   LBM: Last BM Date: 09/26/18 Baseline Weight: Weight: 84.3 kg Most recent weight: Weight: 84.2 kg  Palliative Assessment/Data: 20%    Flowsheet Rows     Most Recent Value  Intake Tab  Referral Department  -- [ED]  Unit at Time of Referral  ER  Date Notified  09/28/18  Palliative Care Type  New Palliative care  Reason for referral  Clarify Goals of Care  Date of Admission  09/28/18  Date first seen by Palliative Care  09/28/18  # of days Palliative referral response time  0 Day(s)  # of days IP prior to Palliative referral  0  Clinical Assessment  Psychosocial & Spiritual Assessment  Palliative Care Outcomes      Patient Active Problem List   Diagnosis Date Noted  . Hypoxia 09/28/2018  . Hyperkalemia   . Acute respiratory failure with hypoxia ([place])   . Acute respiratory distress   . Palliative care by specialist   . DNR (do not resuscitate) discussion   . Weakness generalized   . Bacteremia   . Hypotension 09/23/2018  . CAP (community acquired pneumonia) 09/23/2018  . Elevated troponin 09/23/2018  . AKI (acute kidney injury) ([place]) 09/23/2018  . CKD (chronic kidney disease) stage 4, GFR 15-29 ml/min (HCC) 09/23/2018  . Frequent PVCs 02/23/2015  . Chronic systolic CHF (congestive heart failure) ([place])   . Pacemaker infection ([place]) 07/19/2014  . CKD (chronic kidney disease) stage 3, GFR 30-59 ml/min (HCC) 11/25/2013  . Pacemaker pocket hematoma 10/22/2013  . Acute on chronic systolic heart failure ([place]) 10/06/2013  . H/O noncompliance with medical treatment, presenting hazards to health 08/31/2013  . Peripheral vascular disease, unspecified ([place]) 08/19/2013  . Arterial embolism of right leg ([place]) 07/30/2013  . Ischemic leg 07/29/2013  . Chronic  systolic heart failure ([place]) 11/20/2012  . Paroxysmal atrial flutter ([place]) 06/29/2012  . Acute on chronic systolic CHF (congestive heart failure) ([place]) 06/28/2012  . Ischemic cardiomyopathy 06/28/2012  . Hyperlipidemia 06/28/2012  . Normocytic anemia 06/28/2012  . Anemia 06/25/2012  . Pacemaker-Medtronic 02/01/2012  . HTN (hypertension) 02/28/2011  . Essential hypertension, benign 06/16/2010  . CAD 06/16/2010  . Atrial fibrillation ([place]) 06/16/2010  . SICK SINUS SYNDROME 06/16/2010    Palliative Care Plan    Recommendations/Plan:  Will follow up with wife and shift orders to comfort.  Social work consult for [employer].  Goals of Care and Additional Recommendations:  Limitations on Scope of Treatment: Full Comfort Care  Code Status:  DNR  Prognosis:   Hours - Days   Discharge Planning:  Hospice facility  Care plan was discussed with TRH MD, ID MD, HF MD, wife and bedside RN.  Thank you for allowing the Palliative Medicine Team to assist in the care of this patient.  Total time spent:  35 min.     Greater than 50%  of this time was spent counseling and coordinating care related to the above assessment and plan.  Florentina Jenny, PA-C Palliative Medicine  Please contact Palliative MedicineTeam phone at [phone] for questions and concerns between 7 am - 7 pm.   Please see AMION for individual provider pager numbers.

## 2018-10-01 NOTE — Progress Notes (Signed)
[place] for Heparin (Apixaban on hold) Indication: atrial fibrillation  No Known Allergies  Patient Measurements: Height: 5\' 9"  (175.3 cm) Weight: 187 lb 13.3 oz (85.2 kg) IBW/kg (Calculated) : 70.7 Heparin Dosing Weight: 85.2 kg  Vital Signs: Temp: 97.9 F (36.6 C) (05/12 2343) Temp Source: Axillary (05/12 1932) BP: 80/57 (05/12 2343) Pulse Rate: 78 (05/12 2343)  Labs: Recent Labs    09/28/18 0210 09/28/18 0829  09/28/18 2132 09/29/18 0604 09/30/18 0500 09/30/18 1900 09/30/18 2207  HGB 9.6* 9.0*  --   --   --   --   --   --   HCT 31.6* 28.5*  --   --   --   --   --   --   PLT 260 244  --   --   --   --   --   --   APTT  --   --   --   --   --   --  150* 166*  HEPARINUNFRC  --   --   --   --   --   --  >2.20* >2.20*  CREATININE 4.20* 4.24*   < > 4.46* 4.81* 4.91*  --   --    < > = values in this interval not displayed.    Estimated Creatinine Clearance: 11.5 mL/min (A) (by C-G formula based on SCr of 4.91 mg/dL (H)).   Medical History: Past Medical History:  Diagnosis Date  . Atrial flutter ([place])    a. Dx 07/2014, s/p TEE/DCCV.  . Cataracts, bilateral   . Chronic systolic CHF (congestive heart failure) ([place])    a. s/p CRT-D 09/2013. b. Device explant with temp perm then subsequent CRT-P 07/2014.  Marland Kitchen CKD (chronic kidney disease), stage III ([place])   . Coronary artery disease    a. s/p remote CABG x 2(VG->OM, LIMA->LAD;  b. Late 90's s/p PCI of RCA;  c. 12/09 Cath/PCI: LM 19m, 70-80d (3.0x62mm Xience DES), LAD100p, LCX 80-90p (2.25x48mmTaxus Atom DES), RCA 100d (2.5x67mm Xience DES), VG->OM 100, LIMA->LAD nl, EF 30-35%.  Marland Kitchen DVT (deep venous thrombosis) ([place]) 2017   Right knee  . Hard of hearing   . History of colon polyps   . History of gastric ulcer   . History of gout   . History of shingles   . Hyperlipidemia   . Hypertension   . Ischemic cardiomyopathy    a. EF 35-40% by echo 05/2012, b. EF 20-25%, akinesis and scarring  of inferolateral, inferior and inferoseptal myocardium, mild AI, mod MR, LA mod dilated, RV mildly dialted and sys fx mildly reduced, RA mildly dilated (09/2013)  c. EF 35-40% by 2D in 07/2014, 25-30% by TEE.  . Macular degeneration, dry   . Pacemaker infection ([place])   . PAF (paroxysmal atrial fibrillation) ([place])   . Peripheral vascular disease ([place])   . Presence of permanent cardiac pacemaker 07/23/2014   [facility] [id number], Serial [id number]  . Prostate cancer ([place])   . Symptomatic bradycardia    a. 08/2003 s/p MDT Enpulse [id number] Dual chamber PPM ser # [id number] H. b. Device explant due to infection/temp perm insertion/PPM 07/2014.    Medications:  Scheduled:  . amiodarone  100 mg Oral Daily  . B-complex with vitamin C  1 tablet Oral Daily  . midodrine  5 mg Oral TID WC  . multivitamin  1 tablet Oral BID  . sodium chloride flush  3 mL Intravenous Q12H  .  vitamin C  1,000 mg Oral Daily    Assessment: 32 yom presenting with AKI, on apixaban PTA for atrial fibrillation. Last dose on 5/11@2205 . Scr up to 4.91, on inotropes and diuretics.   CBC has been stable this admission. Given recent apixaban dosing, will monitor both aPTT and heparin level until correlate. No s/sx of bleeding.   5/13 AM update: aPTT is elevated at 166, verified that lab was peripheral stick and heparin is running through a PICC, no issues per RN.   Goal of Therapy:  Heparin level 0.3-0.7 units/ml aPTT 66-102 seconds Monitor platelets by anticoagulation protocol: Yes   Plan:  Hold heparin x 1 hr Re-start heparin drip at 950 units/hr at 0130 1000 heparin level/aPTT  Narda Bonds, PharmD, [place] Pharmacist Phone: [phone]

## 2018-10-01 NOTE — Progress Notes (Signed)
Manufacturing engineer [facility]) Hospital Liaison note.   Received request from Shawnie Pons for family interest in [facility] with request for transfer  today. Chart reviewed and eligibility confirmed. Met with wife, [name] to confirm interest and explain services. Family agreeable to transfer today. CSW aware.  Registration paper work completed. Dr. Orpah Melter to assume care per family request.   Please fax discharge summary to [phone]. RN please call report to [phone]. Please arrange transport for patient.    Thank you,      Farrel Gordon, RN, [facility]   [place]     [place] are on AMION

## 2018-10-01 NOTE — Progress Notes (Signed)
Called [facility] to deactivate ICD as ordered.

## 2018-10-01 NOTE — Progress Notes (Signed)
Spoke with [facility] rep and he stated he would be here in about 30 minutes to deactivate ICD. Palliate made aware so they can arrange transportation to hospice house.

## 2018-10-01 NOTE — Progress Notes (Signed)
Palliative Medicine RN Note: Rec'd call from [place], RN on behalf of cardiology asking if pt will go to [facility] today. Hospice order was placed about 10 minutes ago. I communicated that the SW will coordinate that. As the call was finishing, the PMT PA who saw him this morning arrived at the nurses' station, so Marcene Brawn was able to talk to her directly.  Marjie Skiff Talyn Dessert, RN, BSN, [facility] Palliative Medicine Team 10/01/2018 10:08 AM Office [phone]

## 2018-10-01 NOTE — Progress Notes (Signed)
CRITICAL VALUE ALERT  Critical Value:  APTT:166  Date & Time Notied:  5/13 @ 1230  Provider Notified: Lamar Blinks  Orders Received/Actions taken: [place] responded and informed of pharmacy's recommendation. [place] agreed with their recommendations.  Consulted with pharmacy. Pharmacy recommended holding the heparin for 1 hour and restarting heparin dosing at 950 units/hour.  Actions taken and heparin has been held for 1 hour - will adjust to new dose at 0130.  Patient appears to be in no distress. Patient is sleeping soundly.

## 2018-10-01 NOTE — Progress Notes (Signed)
PROGRESS NOTE    Marcus [name]  DGU:[id number] DOB: 1929/03/13 DOA: 09/28/2018 PCP: Patient, No Pcp Per   Brief Narrative:  HPI on 09/28/2018 by Dr. 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  The patient reports that since arriving home he has not been doing well.  The patient at baseline is ambulatory without assistance, is independent in his ADLs.  On the morning of Sep 27, 2018 he was able to stand and perform his ADLs without assistance.  However, throughout the day he became progressively weaker to the point where his spouse called his son who lives out of town to come help get him out of the chair.  The patient reports worsening shortness of breath particularly with exertion.  Associated symptoms include nausea, vomiting, lower extremity edema.  He denies chest pain, fever, chills, orthopnea.  He reports taking torsemide as prescribed.  Notable events include recent admission from May 4 through Sep 26, 2018.  He was discharged on a home antimicrobial regimen including ampicillin and Bactrim.  Patient transported via EMS to the emergency room.  The patient is a retired Agricultural engineer, former smoker, served in [employer] but does not get his medical care through the [place] system.  He is followed by the heart failure clinic.  In the past he has required milrinone support to allow for effective diuresis per heart failure clinic notes back in 2015.  Interim history Admitted for acute on chronic systolic CHF, acute on chronic CKD, stage IV with hyperkalemia, acute hypoxic respiratory failure.  Cardiology, infectious disease,  palliative care and nephrology consulted.  Seems the patient has been deteriorating and has had worsening creatinine with persistent volume overload and decreased urine output despite high-dose Lasix. Assessment & Plan   Acute on chronic systolic CHF/ICM/cardiogenic shock -Echocardiogram 05/31/2017 shows an EF of 35 to 40%, akinesis of inferior lateral and inferior myocardium, grade 3 diastolic dysfunction -Suspect CHF likely precipitated by volume overload due to acute on chronic kidney disease from high-dose Bactrim -Not on Aldactone, ACE inhibitor or ARB or ARN due to CKD -Cardiogram 09/28/2018 shows an EF of 20 to 25% with akinesis multiple LV walls.  Moderate to severe pulmonary hypertension with severe RV dilatation and systolic dysfunction -Cardiology consulted and appreciated, placed on milrinone drip -Patient treated with high-dose IV Lasix 80 mg twice daily with less than expected diuresis -Coreg discontinued due to soft blood pressures -Continue Midodrin for BP support  Acute on chronic CKD, stage IV/uremia -Baseline creatinine high 2 to low 3 range (creatinine was 3.24 on discharge on 09/26/2018) -Upon admission creatinine 4.2, currently 5.58 -Suspect due to high-dose Bactrim, cardiorenal/low output HF  -Nephrology consulted and appreciated  -Question if patient will need CVVHD- pending further recommendations from nephrology/cardiology -Not sure if patient would be a long term HD candidate  Acute metabolic encephalopathy/uremia  -management as above  Hyperkalemia -Resolved, Presented with potassium of 6.2 -Likely due to Bactrim, acute kidney injury, potassium supplements -Patient was treated with Lokelma -Continue to monitor BMP  Listeria bacteremia -Continue ampicillin -Bactrim discontinued -COVID testing negative x2 -Blood cultures from 09/25/2018 unremarkable to date -Infectious disease consulted and appreciated, continue ampicillin monotherapy through 10/13/2018   Essential hypertension -  Pressures have been soft -Coreg discontinued -Midodrin added for blood pressure support  Paroxsymal atrial fibrillation -Currently AV paced and rate controlled -Continue amiodarone -Coreg discontinued -Eliquis held in case patient needs hemodialysis access due to worsening renal function -Continue IV heparin  Sick sinus syndrome -Status post PPM  CAD -Stable, status post CABG and PCI -No chest pain reported  Anemia of chronic disease -hemoglobin currently 7.2, 3 days prior was 9 -Pending repeat H&H  DVT Prophylaxis  Heparin  Code Status: Partial, No CPR/DNI, only BIPAP and ACLS meds  Family Communication: None at bedside  Disposition Plan: Admitted. Dispo TBD pending improvement and nephrology/cardiology recommendations  Consultants Cardiology Nephrology Palliative care Infectious disease  Procedures  None  Antibiotics   Anti-infectives (From admission, onward)   Start     Dose/Rate Route Frequency Ordered Stop   09/28/18 0600  ampicillin  Status:  Discontinued    Note to Pharmacy:  As a continuous infusion Indication:  Listeria Bacteremia  Last Day of Therapy:  10/13/2018 Labs -Twice weekly:  CBC/D and BMP, Labs - Every other week:  ESR and CRP     2 g Intravenous Every 8 hours 09/28/18 0416 09/28/18 0508   09/28/18 0600  ampicillin (OMNIPEN) 2 g in sodium chloride 0.9 % 100 mL IVPB     2 g 300 mL/hr over 20 Minutes Intravenous Every 8 hours 09/28/18 2992        Subjective:   Marcus [name] seen and examined today.  Patient lethargic but able to answer few questions and follow instructions.  Objective:   Vitals:   10/01/18 0400 10/01/18 0500 10/01/18 0620 10/01/18 0700  BP: (!) 81/44 (!) 86/42 (!) 80/47 (!) 82/39  Pulse: 77   70  Resp: 13   15  Temp: (!) 97.5 F (36.4 C)   97.9 F (36.6 C)  TempSrc: Axillary   Axillary  SpO2: 98%   98%  Weight:      Height:        Intake/Output Summary (Last 24 hours) at 10/01/2018  0839 Last data filed at 10/01/2018 0200 Gross per 24 hour  Intake 1440.55 ml  Output 450 ml  Net 990.55 ml   Filed Weights   09/29/18 0326 09/30/18 0454 10/01/18 0324  Weight: 84.6 kg 85.2 kg 84.2 kg    Exam  General: Well developed,frail, chronically ill appearing, NAD  HEENT: NCAT, mucous membranes moist.   Neck: Supple  Cardiovascular: S1 S2 auscultated, RRR  Respiratory: Diminished and coarse breath sounds  Abdomen: Soft, nontender, nondistended, + bowel sounds  Extremities: warm dry without cyanosis clubbing or edema  Neuro: AAOx1 (self), nonfocal  Psych: Cannot fully assess   Data Reviewed: I have personally reviewed following labs and imaging studies  CBC: Recent Labs  Lab 09/25/18 0552 09/26/18 0945 09/28/18 0210 09/28/18 0829 10/01/18 0140  WBC 6.0 5.7 7.6 11.6* 8.7  NEUTROABS 4.8 4.4  --   --   --   HGB 9.4* 8.8* 9.6* 9.0* 7.2*  HCT 29.4* 27.6* 31.6* 28.5* 22.6*  MCV 90.5 90.2 95.5 91.6 91.5  PLT 172 195 260 244 426   Basic Metabolic Panel: Recent Labs  Lab 09/28/18 1509 09/28/18 2132 09/29/18 0604 09/30/18 0500 10/01/18 0140  NA 134* 134* 135 138 139  K 6.7* 5.5* 5.6* 4.0 3.7  CL 98 99 94* 96* 102  CO2 20* 19* 21* 21* 21*  GLUCOSE 114* 136* 95 131* 111*  BUN 91* 91* 95* 106* 114*  CREATININE 4.27* 4.46* 4.81* 4.91*  5.58*  CALCIUM 9.0 8.9 8.8* 8.6* 7.9*  MG  --   --  2.7*  --   --    GFR: Estimated Creatinine Clearance: 9.3 mL/min (A) (by C-G formula based on SCr of 5.58 mg/dL (H)). Liver Function Tests: Recent Labs  Lab 10/01/18 0140  AST 225*  ALT 472*  ALKPHOS 78  BILITOT 0.9  PROT 5.6*  ALBUMIN 2.4*   No results for input(s): LIPASE, AMYLASE in the last 168 hours. No results for input(s): AMMONIA in the last 168 hours. Coagulation Profile: No results for input(s): INR, PROTIME in the last 168 hours. Cardiac Enzymes: No results for input(s): CKTOTAL, CKMB, CKMBINDEX, TROPONINI in the last 168 hours. BNP (last 3  results) No results for input(s): PROBNP in the last 8760 hours. HbA1C: No results for input(s): HGBA1C in the last 72 hours. CBG: Recent Labs  Lab 09/28/18 1648 09/28/18 2109 09/29/18 0620 09/29/18 1131 09/30/18 1638  GLUCAP 212* 123* 93 94 107*   Lipid Profile: No results for input(s): CHOL, HDL, LDLCALC, TRIG, CHOLHDL, LDLDIRECT in the last 72 hours. Thyroid Function Tests: No results for input(s): TSH, T4TOTAL, FREET4, T3FREE, THYROIDAB in the last 72 hours. Anemia Panel: No results for input(s): VITAMINB12, FOLATE, FERRITIN, TIBC, IRON, RETICCTPCT in the last 72 hours. Urine analysis:    Component Value Date/Time   COLORURINE YELLOW 09/29/2018 1314   APPEARANCEUR CLOUDY (A) 09/29/2018 1314   LABSPEC 1.013 09/29/2018 1314   PHURINE 5.0 09/29/2018 1314   GLUCOSEU NEGATIVE 09/29/2018 1314   HGBUR NEGATIVE 09/29/2018 1314   [place] 09/29/2018 1314   KETONESUR NEGATIVE 09/29/2018 1314   PROTEINUR NEGATIVE 09/29/2018 1314   UROBILINOGEN 0.2 05/24/2012 1344   NITRITE NEGATIVE 09/29/2018 1314   LEUKOCYTESUR NEGATIVE 09/29/2018 1314   Sepsis Labs: _0 (procalcitonin:4,lacticidven:4)  ) Recent Results (from the past 240 hour(s))  Culture, blood (routine x 2)     Status: Abnormal   Collection Time: 09/22/18  7:28 PM  Result Value Ref Range Status   Specimen Description BLOOD RIGHT ANTECUBITAL  Final   Special Requests   Final    BOTTLES DRAWN AEROBIC AND ANAEROBIC Blood Culture adequate volume   Culture  Setup Time (A)  Final    GRAM VARIABLE ROD Organism ID to follow CRITICAL RESULT CALLED TO, READ BACK BY AND VERIFIED WITH: Hughie Closs [facility] 09/23/18 1741 JDW    Culture (A)  Final    LISTERIA MONOCYTOGENES Standardized susceptibility testing for this organism is not available. HEALTH DEPARTMENT NOTIFIED Performed at [facility], [place] [address] [place], [place] [address]    Report Status 09/25/2018 FINAL  Final  Blood Culture ID Panel  (Reflexed)     Status: Abnormal   Collection Time: 09/22/18  7:28 PM  Result Value Ref Range Status   Enterococcus species NOT DETECTED NOT DETECTED Final   Vancomycin resistance NOT DETECTED NOT DETECTED Final   Listeria monocytogenes DETECTED (A) NOT DETECTED Final    Comment: CRITICAL RESULT CALLED TO, READ BACK BY AND VERIFIED WITH: Hughie Closs [facility] 09/23/18 1741 JDW    Staphylococcus species NOT DETECTED NOT DETECTED Final   Staphylococcus aureus (BCID) NOT DETECTED NOT DETECTED Final   Methicillin resistance NOT DETECTED NOT DETECTED Final   Streptococcus species NOT DETECTED NOT DETECTED Final   Streptococcus agalactiae NOT DETECTED NOT DETECTED Final   Streptococcus pneumoniae NOT DETECTED NOT DETECTED Final   Streptococcus pyogenes NOT DETECTED NOT DETECTED Final   Acinetobacter baumannii NOT DETECTED NOT DETECTED Final  Enterobacteriaceae species NOT DETECTED NOT DETECTED Final   Enterobacter cloacae complex NOT DETECTED NOT DETECTED Final   Escherichia coli NOT DETECTED NOT DETECTED Final   Klebsiella oxytoca NOT DETECTED NOT DETECTED Final   Klebsiella pneumoniae NOT DETECTED NOT DETECTED Final   Proteus species NOT DETECTED NOT DETECTED Final   Serratia marcescens NOT DETECTED NOT DETECTED Final   Carbapenem resistance NOT DETECTED NOT DETECTED Final   Haemophilus influenzae NOT DETECTED NOT DETECTED Final   Neisseria meningitidis NOT DETECTED NOT DETECTED Final   Pseudomonas aeruginosa NOT DETECTED NOT DETECTED Final   Candida albicans NOT DETECTED NOT DETECTED Final   Candida glabrata NOT DETECTED NOT DETECTED Final   Candida krusei NOT DETECTED NOT DETECTED Final   Candida parapsilosis NOT DETECTED NOT DETECTED Final   Candida tropicalis NOT DETECTED NOT DETECTED Final    Comment: Performed at [facility], [place] [address], [place], [place] [address]  Culture, blood (routine x 2)     Status: Abnormal   Collection Time: 09/22/18  7:47 PM  Result Value Ref Range  Status   Specimen Description BLOOD LEFT ANTECUBITAL  Final   Special Requests   Final    BOTTLES DRAWN AEROBIC ONLY Blood Culture adequate volume   Culture  Setup Time   Final    AEROBIC BOTTLE ONLY GRAM VARIABLE ROD CRITICAL VALUE NOTED.  VALUE IS CONSISTENT WITH PREVIOUSLY REPORTED AND CALLED VALUE.    Culture (A)  Final    LISTERIA MONOCYTOGENES Standardized susceptibility testing for this organism is not available. HEALTH DEPARTMENT NOTIFIED Performed at [facility], [place] [address], [place], [place] [address]    Report Status 09/25/2018 FINAL  Final  SARS Coronavirus 2 (CEPHEID - Performed in [place] hospital lab), Hosp Order     Status: None   Collection Time: 09/22/18  7:47 PM  Result Value Ref Range Status   SARS Coronavirus 2 NEGATIVE NEGATIVE Final    Comment: (NOTE) If result is NEGATIVE SARS-CoV-2 target nucleic acids are NOT DETECTED. The SARS-CoV-2 RNA is generally detectable in upper and lower  respiratory specimens during the acute phase of infection. The lowest  concentration of SARS-CoV-2 viral copies this assay can detect is 250  copies / mL. A negative result does not preclude SARS-CoV-2 infection  and should not be used as the sole basis for treatment or other  patient management decisions.  A negative result may occur with  improper specimen collection / handling, submission of specimen other  than nasopharyngeal swab, presence of viral mutation(s) within the  areas targeted by this assay, and inadequate number of viral copies  (<250 copies / mL). A negative result must be combined with clinical  observations, patient history, and epidemiological information. If result is POSITIVE SARS-CoV-2 target nucleic acids are DETECTED. The SARS-CoV-2 RNA is generally detectable in upper and lower  respiratory specimens dur ing the acute phase of infection.  Positive  results are indicative of active infection with SARS-CoV-2.  Clinical  correlation with  patient history and other diagnostic information is  necessary to determine patient infection status.  Positive results do  not rule out bacterial infection or co-infection with other viruses. If result is PRESUMPTIVE POSTIVE SARS-CoV-2 nucleic acids MAY BE PRESENT.   A presumptive positive result was obtained on the submitted specimen  and confirmed on repeat testing.  While 2019 novel coronavirus  (SARS-CoV-2) nucleic acids may be present in the submitted sample  additional confirmatory testing may be necessary for  epidemiological  and / or clinical management purposes  to differentiate between  SARS-CoV-2 and other Sarbecovirus currently known to infect humans.  If clinically indicated additional testing with an alternate test  methodology [id number]) is advised. The SARS-CoV-2 RNA is generally  detectable in upper and lower respiratory sp ecimens during the acute  phase of infection. The expected result is Negative. Fact Sheet for Patients:  StrictlyIdeas.no Fact Sheet for Healthcare Providers: BankingDealers.co.za This test is not yet approved or cleared by the [place] FDA and has been authorized for detection and/or diagnosis of SARS-CoV-2 by FDA under an Emergency Use Authorization (EUA).  This EUA will remain in effect (meaning this test can be used) for the duration of the COVID-19 declaration under Section 564(b)(1) of the Act, 21 U.S.C. section 360bbb-3(b)(1), unless the authorization is terminated or revoked sooner. Performed at [facility], [place] [address], [place], [place] [address]   MRSA PCR Screening     Status: None   Collection Time: 09/23/18  2:31 AM  Result Value Ref Range Status   MRSA by PCR NEGATIVE NEGATIVE Final    Comment:        The GeneXpert MRSA Assay (FDA approved for NASAL specimens only), is one component of a comprehensive MRSA colonization surveillance program. It is not intended to  diagnose MRSA infection nor to guide or monitor treatment for MRSA infections. Performed at [facility], [place] [address] [place], [place] [address]   Culture, blood (routine x 2)     Status: None   Collection Time: 09/25/18  7:32 AM  Result Value Ref Range Status   Specimen Description BLOOD RIGHT HAND  Final   Special Requests   Final    BOTTLES DRAWN AEROBIC ONLY Blood Culture adequate volume   Culture   Final    NO GROWTH 5 DAYS Performed at [facility], [place] [address], [place], [place] [address]    Report Status 09/30/2018 FINAL  Final  Culture, blood (routine x 2)     Status: None   Collection Time: 09/25/18  7:32 AM  Result Value Ref Range Status   Specimen Description BLOOD RIGHT HAND  Final   Special Requests   Final    BOTTLES DRAWN AEROBIC ONLY Blood Culture adequate volume   Culture   Final    NO GROWTH 5 DAYS Performed at [facility], [place] [address], [place], [place] [address]    Report Status 09/30/2018 FINAL  Final  SARS Coronavirus 2 (CEPHEID - Performed in [place] hospital lab), Hosp Order     Status: None   Collection Time: 09/28/18  2:25 AM  Result Value Ref Range Status   SARS Coronavirus 2 NEGATIVE NEGATIVE Final    Comment: (NOTE) If result is NEGATIVE SARS-CoV-2 target nucleic acids are NOT DETECTED. The SARS-CoV-2 RNA is generally detectable in upper and lower  respiratory specimens during the acute phase of infection. The lowest  concentration of SARS-CoV-2 viral copies this assay can detect is 250  copies / mL. A negative result does not preclude SARS-CoV-2 infection  and should not be used as the sole basis for treatment or other  patient management decisions.  A negative result may occur with  improper specimen collection / handling, submission of specimen other  than nasopharyngeal swab, presence of viral mutation(s) within the  areas targeted by this assay, and inadequate number of viral copies  (<250 copies / mL). A  negative result must be  combined with clinical  observations, patient history, and epidemiological information. If result is POSITIVE SARS-CoV-2 target nucleic acids are DETECTED. The SARS-CoV-2 RNA is generally detectable in upper and lower  respiratory specimens dur ing the acute phase of infection.  Positive  results are indicative of active infection with SARS-CoV-2.  Clinical  correlation with patient history and other diagnostic information is  necessary to determine patient infection status.  Positive results do  not rule out bacterial infection or co-infection with other viruses. If result is PRESUMPTIVE POSTIVE SARS-CoV-2 nucleic acids MAY BE PRESENT.   A presumptive positive result was obtained on the submitted specimen  and confirmed on repeat testing.  While 2019 novel coronavirus  (SARS-CoV-2) nucleic acids may be present in the submitted sample  additional confirmatory testing may be necessary for epidemiological  and / or clinical management purposes  to differentiate between  SARS-CoV-2 and other Sarbecovirus currently known to infect humans.  If clinically indicated additional testing with an alternate test  methodology [phone]) is advised. The SARS-CoV-2 RNA is generally  detectable in upper and lower respiratory sp ecimens during the acute  phase of infection. The expected result is Negative. Fact Sheet for Patients:  StrictlyIdeas.no Fact Sheet for Healthcare Providers: BankingDealers.co.za This test is not yet approved or cleared by the [place] FDA and has been authorized for detection and/or diagnosis of SARS-CoV-2 by FDA under an Emergency Use Authorization (EUA).  This EUA will remain in effect (meaning this test can be used) for the duration of the COVID-19 declaration under Section 564(b)(1) of the Act, 21 U.S.C. section 360bbb-3(b)(1), unless the authorization is terminated or revoked sooner. Performed  at [facility], [place] [address], [place], [place] [address]       Radiology Studies: No results found.   Scheduled Meds: . amiodarone  100 mg Oral Daily  . B-complex with vitamin C  1 tablet Oral Daily  . midodrine  5 mg Oral TID WC  . multivitamin  1 tablet Oral BID  . sodium chloride flush  3 mL Intravenous Q12H  . vitamin C  1,000 mg Oral Daily   Continuous Infusions: . sodium chloride Stopped (09/30/18 0725)  . ampicillin (OMNIPEN) IV 2 g (10/01/18 0523)  . furosemide 120 mg (09/30/18 1805)  . heparin 950 Units/hr (10/01/18 0200)  . milrinone 0.25 mcg/kg/min (10/01/18 0247)     LOS: 3 days   Time Spent in minutes   30 minutes  Montia Haslip D.O. on 10/01/2018 at 8:39 AM  Between 7am to 7pm - Please see pager noted on [email or web address]  After 7pm go to [email or web address]  And look for the night coverage person covering for me after hours  [facility] Office  [phone]

## 2018-10-01 NOTE — Progress Notes (Signed)
Transport arrived to escort Real to the [facility]. Milrinone taken until bag runs out. In order to allow more time for his wife to see him. Wife called to notify her that he has left our facility. Report called to Arbie Cookey at the [facility] at [phone]. Only personal items were a few pieces of clothing which were sent in a belongings bag to [facility]. Patient A/O to self. No signs of distress. O2 at 3L via n/c.

## 2018-10-01 NOTE — Plan of Care (Signed)
Patient discharging to [facility].

## 2018-10-01 NOTE — Progress Notes (Signed)
  Per Dr Haroldine Laws  Contacted [employer] to deactivate ICD. Order placed.   Lexie Morini NP-C  9:55 AM

## 2018-10-01 NOTE — Progress Notes (Signed)
Hgb 7.2 this morning. Paged [facility] regarding lab value and awaiting orders.

## 2018-10-01 NOTE — Discharge Summary (Signed)
Physician Discharge Summary  Marcus [name] JXB:[id number] DOB: [birth year] DOA: 09/28/2018  PCP: Patient, No Pcp Per  Admit date: 09/28/2018 Discharge date: 10/01/2018  Time spent: 45 minutes  Recommendations for Outpatient Follow-up:  Patient will be discharged to [facility]. Continue comfort care.  Discharge Diagnoses:  Acute on chronic systolic CHF/ICM/cardiogenic shock Acute on chronic CKD, stage IV/uremia Acute metabolic encephalopathy/uremia  Hyperkalemia Listeria bacteremia Essential hypertension Paroxsymal atrial fibrillation Sick sinus syndrome CAD Anemia of chronic disease Goals of care  Discharge Condition: Stable  Diet recommendation: Comfort  Filed Weights   09/29/18 0326 09/30/18 0454 10/01/18 0324  Weight: 84.6 kg 85.2 kg 84.2 kg    History of present illness:  on 09/28/2018 by Dr. 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  The patient reports that since arriving home he has not been doing well. The patient at baseline is ambulatory without assistance, is independent in his ADLs. On the morning of Sep 27, 2018 he was able to stand and perform his ADLs without assistance. However, throughout the day he became progressively weaker to the point where his spouse called his son who lives out of town to come help get him out of the chair. The patient reports worsening shortness of breath particularly with exertion. Associated symptoms include nausea, vomiting, lower extremity edema. He denies chest pain, fever, chills, orthopnea. He reports taking torsemide as prescribed. Notable events  include recent admission from May 4 through Sep 26, 2018. He was discharged on a home antimicrobial regimen including ampicillin and Bactrim. Patient transported via EMS to the emergency room.  The patient is a retired Agricultural engineer, former smoker, served in [employer] but does not get his medical care through the [place] system. He is followed by the heart failure clinic. In the past he has required milrinone support to allow for effective diuresis per heart failure clinic notes back in 2015.  Hospital Course:  Acute on chronic systolic CHF/ICM/cardiogenic shock -Echocardiogram 05/31/2017 shows an EF of 35 to 40%, akinesis of inferior lateral and inferior myocardium, grade 3 diastolic dysfunction -Suspect CHF likely precipitated by volume overload due to acute on chronic kidney disease from high-dose Bactrim -Not on Aldactone, ACE inhibitor or ARB or ARN due to CKD -Cardiogram 09/28/2018 shows an EF of 20 to 25% with akinesis multiple LV walls.  Moderate to severe pulmonary hypertension with severe RV dilatation and systolic dysfunction -Cardiology consulted and appreciated, placed on milrinone drip -Patient was treated with high-dose IV Lasix 80 mg twice daily with less than expected diuresis -Coreg discontinued due to soft blood pressures and patient was placed on midodrine for BP support  Acute on chronic CKD, stage IV/uremia -Baseline creatinine high 2 to low 3 range (creatinine was 3.24 on discharge on 09/26/2018) -Upon admission creatinine 4.2, currently 5.58 -Suspect due to high-dose Bactrim, cardiorenal/low output HF  -Nephrology consulted and appreciated  -Allergy discussed with patient's wife, she does not wish to proceed with CRRT  Acute metabolic encephalopathy/uremia  -management as above  Hyperkalemia -Resolved, Presented with potassium of 6.2 -Likely due to Bactrim, acute kidney injury, potassium supplements -Patient was treated with Lokelma  Listeria  bacteremia -Continue ampicillin -Bactrim discontinued -COVID testing negative x2 -  Blood cultures from 09/25/2018 unremarkable to date -Infectious disease consulted and appreciated, continue ampicillin monotherapy through 10/13/2018  Essential hypertension -Pressures have been soft -Coreg discontinued -Midodrin added for blood pressure support  Paroxsymal atrial fibrillation -Currently AV paced and rate controlled -Continue amiodarone -Coreg discontinued -Eliquis held in case patient needs hemodialysis access due to worsening renal function -was placed on heparin  Sick sinus syndrome -Status post PPM  CAD -Stable, status post CABG and PCI -No chest pain reported  Anemia of chronic disease -hemoglobin currently 7.2  Goals of care -Patient was initially a partial code however now full DNR -Palliative care consulted and appreciated -Given that patient was placed on high-dose IV Lasix with milrinone, patient had minimal improvement with worsening creatinine.  Wife does not wish to move forward with CVVH. -Plan for patient to be discharged today to [facility] place, comfort care  Consultants Cardiology Nephrology Palliative care Infectious disease  Procedures  None  Discharge Exam: Vitals:   10/01/18 0700 10/01/18 1000  BP: (!) 82/39 (!) 82/48  Pulse: 70 70  Resp: 15 13  Temp: 97.9 F (36.6 C)   SpO2: 98%      General: Well developed, chronically ill appearing, NAD  HEENT: NCAT, mucous membranes moist.  Neck: Supple  Cardiovascular: S1 S2 auscultated, RRR  Respiratory: Diminished breath sounds  Abdomen: Soft, nontender, nondistended, + bowel sounds  Extremities: warm dry without cyanosis clubbing or edema  Neuro: AAOx1 (self), nonfocal  Psych: Cannot fully assess  Discharge Instructions  Allergies as of 10/01/2018   No Known Allergies     Medication List    STOP taking these medications   amiodarone 200 MG tablet Commonly known as:   PACERONE   ampicillin  IVPB   b complex vitamins tablet   carvedilol 6.25 MG tablet Commonly known as:  COREG   Co Q 10 100 MG Caps   Eliquis 2.5 MG Tabs tablet Generic drug:  apixaban   Magnesium 250 MG Tabs   NON FORMULARY   potassium chloride SA 20 MEQ tablet Commonly known as:  K-DUR   PreserVision AREDS 2 Caps   Resveratrol 100 MG Caps   sulfamethoxazole-trimethoprim 800-160 MG tablet Commonly known as:  BACTRIM DS   torsemide 20 MG tablet Commonly known as:  DEMADEX   vitamin C 1000 MG tablet   zinc gluconate 50 MG tablet     TAKE these medications   acetaminophen 325 MG tablet Commonly known as:  TYLENOL Take 2 tablets (650 mg total) by mouth every 6 (six) hours as needed for mild pain or fever (or Fever >/= 101).      No Known Allergies    The results of significant diagnostics from this hospitalization (including imaging, microbiology, ancillary and laboratory) are listed below for reference.    Significant Diagnostic Studies: Dg Chest 2 View  Result Date: 09/23/2018 CLINICAL DATA:  Dyspnea.  Altered mental status. EXAM: CHEST - 2 VIEW COMPARISON:  09/22/2018 FINDINGS: Stable appearance of pacer device and associated leads. Mild cardiomegaly. Prior median sternotomy. Atherosclerotic calcification of the aortic arch. Mild interstitial accentuation is present bilaterally. Chronic retrodiaphragmatic opacity on the right primarily along the posterior pleural margin. Pleural calcification is noted projecting over the cardiac apex. 4.6 cm nodule projects over the upper portion of the right major fissure and based on prior exams such as the chest CT from 05/03/2008 may represent chronic pleural fluid density collection, although is not entirely specific. No free-flowing pleural effusion is identified. IMPRESSION: 1. Cardiomegaly with  interstitial accentuation favoring interstitial edema. 2. 4.6 cm in diameter masslike density in the vicinity of the upper portion  of the right major fissure is noted; on prior exams such as the remote prior chest CT from 05/03/2008, there has been loculated pleural fluid and some in the vicinity of the major fissure. No free-flowing pleural effusion is currently identified. Strictly speaking I cannot exclude a right upper lobe lung cancer, and chest CT could be utilized to differentiate. 3. Dense pleural calcifications projecting over the cardiac apex. Electronically Signed   By: Van Clines M.D.   On: 09/23/2018 12:26   Ct Head Wo Contrast  Result Date: 09/22/2018 CLINICAL DATA:  Altered level of consciousness. Weakness. EXAM: CT HEAD WITHOUT CONTRAST TECHNIQUE: Contiguous axial images were obtained from the base of the skull through the vertex without intravenous contrast. COMPARISON:  None. FINDINGS: Brain: There is no evidence of acute infarct, intracranial hemorrhage, mass, midline shift, or extra-axial fluid collection. There is mild cerebral atrophy. Cerebral white matter hypodensities are nonspecific but compatible with mild chronic small vessel ischemic disease. A left choroidal fissure cyst is noted. Vascular: Calcified atherosclerosis at the skull base. No suspicious vascular hyperdensity. Skull: No fracture or focal osseous lesion. Sinuses/Orbits: Paranasal sinuses and mastoid air cells are clear. Bilateral cataract extraction. Other: None. IMPRESSION: 1. No evidence of acute intracranial abnormality. 2. Mild chronic small vessel ischemic disease. Electronically Signed   By: Logan Bores M.D.   On: 09/22/2018 21:48   Ct Chest Wo Contrast  Result Date: 09/24/2018 CLINICAL DATA:  Abnormal chest x-ray with possible mass in the right upper lobe. EXAM: CT CHEST WITHOUT CONTRAST TECHNIQUE: Multidetector CT imaging of the chest was performed following the standard protocol without IV contrast. COMPARISON:  Chest x-rays dated 09/23/2018 and 09/22/2018 and chest CT dated 05/03/2008 FINDINGS: Cardiovascular: Aortic  atherosclerosis. Pacemaker in place. Borderline cardiomegaly. No pericardial effusion. CABG. Mediastinum/Nodes: No enlarged mediastinal or axillary lymph nodes. Thyroid gland, trachea, and esophagus demonstrate no significant findings. Lungs/Pleura: There is a multiloculated right pleural effusion. There is an anterolateral loculation of fluid adjacent to the right upper lobe which simulates a mass on chest x-ray. There is also fluid loculated medially at the right lung apex adjacent to the mediastinum as well as posterolaterally right upper chest. There is a slightly irregular loculated fluid collection posterior medially in the right upper lobe as well. There is a small amount of fluid loculated along the superior aspect of the left major fissure. There is also a small amount of fluid chronically loculated adjacent to the descending thoracic aorta at the level of the left mainstem bronchus extending inferiorly. This is unchanged since the prior CT scan. There are multiple pleural based calcifications bilateral. No consolidative infiltrates. There is a poorly defined 7 mm density in the right lower lobe on image 79 of series 6 which is unchanged since the prior exam of 2009. Upper Abdomen: No significant abnormality. IMPRESSION: 1. No significant lung mass. The density seen on chest x-ray of 09/23/2018 represents loculated pleural fluid adjacent to the right upper lobe. 2. Multiple loculated areas of pleural of fluid adjacent to the right upper lobe and to a lesser degree in the left hemithorax. 3.  Aortic Atherosclerosis (ICD10-I70.0). 4. Stable 7 mm density in the right lower lobe, unchanged since 2009. No follow-up is necessary. Electronically Signed   By: Lorriane Shire M.D.   On: 09/24/2018 13:28   US Renal  Result Date: 09/23/2018 CLINICAL DATA:  Acute kidney injury  EXAM: RENAL / URINARY TRACT ULTRASOUND COMPLETE COMPARISON:  07/18/2018 FINDINGS: Right Kidney: Renal measurements: 9.9 x 4.3 x 4.9 cm =  volume: 109 mL. Cortical thinning and increased echotexture. No mass or hydronephrosis. Left Kidney: Renal measurements: 9.9 x 3.7 x 4.3 cm = volume: 82 mL. Cortical thinning with increased echotexture. No mass or hydronephrosis. Bladder: Appears normal for degree of bladder distention. IMPRESSION: Stable cortical thinning and increased echotexture compatible with chronic medical renal disease. No hydronephrosis or acute findings. Electronically Signed   By: Rolm Baptise M.D.   On: 09/23/2018 03:25   Dg Chest Portable 1 View  Result Date: 09/28/2018 CLINICAL DATA:  85 y/o  M; shortness of breath all day. EXAM: PORTABLE CHEST 1 VIEW COMPARISON:  09/25/2018 chest radiograph FINDINGS: Cardiomegaly and 3 lead pacemaker. Post median sternotomy and CABG. Increased diffuse reticular and patchy bibasilar opacities of the lungs. Probable small bilateral pleural effusions. Right PICC catheter tip projects over the lower SVC. No acute osseous abnormality identified. IMPRESSION: Increased diffuse reticular and patchy bibasilar opacities, likely pulmonary edema. Probable small bilateral pleural effusions. Electronically Signed   By: Kristine Garbe M.D.   On: 09/28/2018 02:35   Dg Chest Portable 1 View  Result Date: 09/22/2018 CLINICAL DATA:  Altered mental status EXAM: PORTABLE CHEST 1 VIEW COMPARISON:  08/04/2014 FINDINGS: Cardiac shadow is mildly enlarged but stable. Postsurgical changes and pacing device are again seen and stable. Chronic scarring in the left base is noted. Mild increased density is noted in the right hemithorax consistent with small posterior effusion. Mild right basilar infiltrate is noted as well. No focal confluent infiltrate is seen. No bony abnormality is noted. IMPRESSION: Small right pleural effusion with mild basilar infiltrate Chronic changes in the right base. Electronically Signed   By: Inez Catalina M.D.   On: 09/22/2018 21:42   Dg Chest Port 1v Same Day  Result Date:  09/25/2018 CLINICAL DATA:  PICC line placement. EXAM: PORTABLE CHEST 1 VIEW COMPARISON:  Chest x-ray dated Sep 23, 2018. FINDINGS: Interval placement of a right upper extremity PICC line with the tip in the distal SVC. Unchanged left chest wall pacemaker. Stable cardiomegaly. Mildly improved interstitial pulmonary edema. Vague hazy opacity over the peripheral right upper lobe is unchanged. Suspected small loculated right pleural effusion is also unchanged. Probable trace left pleural effusion, unchanged. No consolidation or pneumothorax. Unchanged scattered calcified pleural plaques. No acute osseous abnormality. IMPRESSION: 1. Right upper extremity PICC line with tip in the distal SVC. 2. Mildly improved interstitial pulmonary edema. 3. Unchanged small loculated right pleural effusion with suspected fluid in the right major fissure. Electronically Signed   By: Titus Dubin M.D.   On: 09/25/2018 13:15   [place] Ekg Site Rite  Result Date: 09/25/2018 If Site Rite image not attached, placement could not be confirmed due to current cardiac rhythm.   Microbiology: Recent Results (from the past 240 hour(s))  Culture, blood (routine x 2)     Status: Abnormal   Collection Time: 09/22/18  7:28 PM  Result Value Ref Range Status   Specimen Description BLOOD RIGHT ANTECUBITAL  Final   Special Requests   Final    BOTTLES DRAWN AEROBIC AND ANAEROBIC Blood Culture adequate volume   Culture  Setup Time (A)  Final    GRAM VARIABLE ROD Organism ID to follow CRITICAL RESULT CALLED TO, READ BACK BY AND VERIFIED WITH: Hughie Closs [facility] 09/23/18 1741 JDW    Culture (A)  Final    LISTERIA MONOCYTOGENES Standardized  susceptibility testing for this organism is not available. HEALTH DEPARTMENT NOTIFIED Performed at [facility], [place] [address], [place], [place] [address]    Report Status 09/25/2018 FINAL  Final  Blood Culture ID Panel (Reflexed)     Status: Abnormal   Collection Time: 09/22/18  7:28 PM  Result  Value Ref Range Status   Enterococcus species NOT DETECTED NOT DETECTED Final   Vancomycin resistance NOT DETECTED NOT DETECTED Final   Listeria monocytogenes DETECTED (A) NOT DETECTED Final    Comment: CRITICAL RESULT CALLED TO, READ BACK BY AND VERIFIED WITH: Hughie Closs PHARMD 09/23/18 1741 JDW    Staphylococcus species NOT DETECTED NOT DETECTED Final   Staphylococcus aureus (BCID) NOT DETECTED NOT DETECTED Final   Methicillin resistance NOT DETECTED NOT DETECTED Final   Streptococcus species NOT DETECTED NOT DETECTED Final   Streptococcus agalactiae NOT DETECTED NOT DETECTED Final   Streptococcus pneumoniae NOT DETECTED NOT DETECTED Final   Streptococcus pyogenes NOT DETECTED NOT DETECTED Final   Acinetobacter baumannii NOT DETECTED NOT DETECTED Final   Enterobacteriaceae species NOT DETECTED NOT DETECTED Final   Enterobacter cloacae complex NOT DETECTED NOT DETECTED Final   Escherichia coli NOT DETECTED NOT DETECTED Final   Klebsiella oxytoca NOT DETECTED NOT DETECTED Final   Klebsiella pneumoniae NOT DETECTED NOT DETECTED Final   Proteus species NOT DETECTED NOT DETECTED Final   Serratia marcescens NOT DETECTED NOT DETECTED Final   Carbapenem resistance NOT DETECTED NOT DETECTED Final   Haemophilus influenzae NOT DETECTED NOT DETECTED Final   Neisseria meningitidis NOT DETECTED NOT DETECTED Final   Pseudomonas aeruginosa NOT DETECTED NOT DETECTED Final   Candida albicans NOT DETECTED NOT DETECTED Final   Candida glabrata NOT DETECTED NOT DETECTED Final   Candida krusei NOT DETECTED NOT DETECTED Final   Candida parapsilosis NOT DETECTED NOT DETECTED Final   Candida tropicalis NOT DETECTED NOT DETECTED Final    Comment: Performed at [facility], [place]. [address], [place], [place] [address]  Culture, blood (routine x 2)     Status: Abnormal   Collection Time: 09/22/18  7:47 PM  Result Value Ref Range Status   Specimen Description BLOOD LEFT ANTECUBITAL  Final   Special Requests    Final    BOTTLES DRAWN AEROBIC ONLY Blood Culture adequate volume   Culture  Setup Time   Final    AEROBIC BOTTLE ONLY GRAM VARIABLE ROD CRITICAL VALUE NOTED.  VALUE IS CONSISTENT WITH PREVIOUSLY REPORTED AND CALLED VALUE.    Culture (A)  Final    LISTERIA MONOCYTOGENES Standardized susceptibility testing for this organism is not available. HEALTH DEPARTMENT NOTIFIED Performed at [facility], [place] [address] [place],  [address]    Report Status 09/25/2018 FINAL  Final  SARS Coronavirus 2 (CEPHEID - Performed in [place] hospital lab), Hosp Order     Status: None   Collection Time: 09/22/18  7:47 PM  Result Value Ref Range Status   SARS Coronavirus 2 NEGATIVE NEGATIVE Final    Comment: (NOTE) If result is NEGATIVE SARS-CoV-2 target nucleic acids are NOT DETECTED. The SARS-CoV-2 RNA is generally detectable in upper and lower  respiratory specimens during the acute phase of infection. The lowest  concentration of SARS-CoV-2 viral copies this assay can detect is 250  copies / mL. A negative result does not preclude SARS-CoV-2 infection  and should not be used as the sole basis for treatment or other  patient management decisions.  A negative result may  occur with  improper specimen collection / handling, submission of specimen other  than nasopharyngeal swab, presence of viral mutation(s) within the  areas targeted by this assay, and inadequate number of viral copies  (<250 copies / mL). A negative result must be combined with clinical  observations, patient history, and epidemiological information. If result is POSITIVE SARS-CoV-2 target nucleic acids are DETECTED. The SARS-CoV-2 RNA is generally detectable in upper and lower  respiratory specimens dur ing the acute phase of infection.  Positive  results are indicative of active infection with SARS-CoV-2.  Clinical  correlation with patient history and other diagnostic information is  necessary to determine  patient infection status.  Positive results do  not rule out bacterial infection or co-infection with other viruses. If result is PRESUMPTIVE POSTIVE SARS-CoV-2 nucleic acids MAY BE PRESENT.   A presumptive positive result was obtained on the submitted specimen  and confirmed on repeat testing.  While 2019 novel coronavirus  (SARS-CoV-2) nucleic acids may be present in the submitted sample  additional confirmatory testing may be necessary for epidemiological  and / or clinical management purposes  to differentiate between  SARS-CoV-2 and other Sarbecovirus currently known to infect humans.  If clinically indicated additional testing with an alternate test  methodology [id number]) is advised. The SARS-CoV-2 RNA is generally  detectable in upper and lower respiratory sp ecimens during the acute  phase of infection. The expected result is Negative. Fact Sheet for Patients:  StrictlyIdeas.no Fact Sheet for Healthcare Providers: BankingDealers.co.za This test is not yet approved or cleared by the [place] FDA and has been authorized for detection and/or diagnosis of SARS-CoV-2 by FDA under an Emergency Use Authorization (EUA).  This EUA will remain in effect (meaning this test can be used) for the duration of the COVID-19 declaration under Section 564(b)(1) of the Act, 21 U.S.C. section 360bbb-3(b)(1), unless the authorization is terminated or revoked sooner. Performed at [facility], [place] [address] [place], [place] [address]   MRSA PCR Screening     Status: None   Collection Time: 09/23/18  2:31 AM  Result Value Ref Range Status   MRSA by PCR NEGATIVE NEGATIVE Final    Comment:        The GeneXpert MRSA Assay (FDA approved for NASAL specimens only), is one component of a comprehensive MRSA colonization surveillance program. It is not intended to diagnose MRSA infection nor to guide or monitor treatment for MRSA  infections. Performed at [facility], [place] [address] [place], [place] [address]   Culture, blood (routine x 2)     Status: None   Collection Time: 09/25/18  7:32 AM  Result Value Ref Range Status   Specimen Description BLOOD RIGHT HAND  Final   Special Requests   Final    BOTTLES DRAWN AEROBIC ONLY Blood Culture adequate volume   Culture   Final    NO GROWTH 5 DAYS Performed at [facility], [place] [address] [place],  [address]    Report Status 09/30/2018 FINAL  Final  Culture, blood (routine x 2)     Status: None   Collection Time: 09/25/18  7:32 AM  Result Value Ref Range Status   Specimen Description BLOOD RIGHT HAND  Final   Special Requests   Final    BOTTLES DRAWN AEROBIC ONLY Blood Culture adequate volume   Culture   Final    NO GROWTH 5 DAYS Performed at [facility], [place] [address],  [place], [place] [address]    Report Status 09/30/2018 FINAL  Final  SARS Coronavirus 2 (CEPHEID - Performed in [place] hospital lab), Hosp Order     Status: None   Collection Time: 09/28/18  2:25 AM  Result Value Ref Range Status   SARS Coronavirus 2 NEGATIVE NEGATIVE Final    Comment: (NOTE) If result is NEGATIVE SARS-CoV-2 target nucleic acids are NOT DETECTED. The SARS-CoV-2 RNA is generally detectable in upper and lower  respiratory specimens during the acute phase of infection. The lowest  concentration of SARS-CoV-2 viral copies this assay can detect is 250  copies / mL. A negative result does not preclude SARS-CoV-2 infection  and should not be used as the sole basis for treatment or other  patient management decisions.  A negative result may occur with  improper specimen collection / handling, submission of specimen other  than nasopharyngeal swab, presence of viral mutation(s) within the  areas targeted by this assay, and inadequate number of viral copies  (<250 copies / mL). A negative result must be combined with clinical  observations, patient  history, and epidemiological information. If result is POSITIVE SARS-CoV-2 target nucleic acids are DETECTED. The SARS-CoV-2 RNA is generally detectable in upper and lower  respiratory specimens dur ing the acute phase of infection.  Positive  results are indicative of active infection with SARS-CoV-2.  Clinical  correlation with patient history and other diagnostic information is  necessary to determine patient infection status.  Positive results do  not rule out bacterial infection or co-infection with other viruses. If result is PRESUMPTIVE POSTIVE SARS-CoV-2 nucleic acids MAY BE PRESENT.   A presumptive positive result was obtained on the submitted specimen  and confirmed on repeat testing.  While 2019 novel coronavirus  (SARS-CoV-2) nucleic acids may be present in the submitted sample  additional confirmatory testing may be necessary for epidemiological  and / or clinical management purposes  to differentiate between  SARS-CoV-2 and other Sarbecovirus currently known to infect humans.  If clinically indicated additional testing with an alternate test  methodology [phone]) is advised. The SARS-CoV-2 RNA is generally  detectable in upper and lower respiratory sp ecimens during the acute  phase of infection. The expected result is Negative. Fact Sheet for Patients:  StrictlyIdeas.no Fact Sheet for Healthcare Providers: BankingDealers.co.za This test is not yet approved or cleared by the [place] FDA and has been authorized for detection and/or diagnosis of SARS-CoV-2 by FDA under an Emergency Use Authorization (EUA).  This EUA will remain in effect (meaning this test can be used) for the duration of the COVID-19 declaration under Section 564(b)(1) of the Act, 21 U.S.C. section 360bbb-3(b)(1), unless the authorization is terminated or revoked sooner. Performed at [facility], [place] [address] [place], [place] [address]       Labs: Basic Metabolic Panel: Recent Labs  Lab 09/28/18 1509 09/28/18 2132 09/29/18 0604 09/30/18 0500 10/01/18 0140  NA 134* 134* 135 138 139  K 6.7* 5.5* 5.6* 4.0 3.7  CL 98 99 94* 96* 102  CO2 20* 19* 21* 21* 21*  GLUCOSE 114* 136* 95 131* 111*  BUN 91* 91* 95* 106* 114*  CREATININE 4.27* 4.46* 4.81* 4.91* 5.58*  CALCIUM 9.0 8.9 8.8* 8.6* 7.9*  MG  --   --  2.7*  --   --    Liver Function Tests: Recent Labs  Lab 10/01/18 0140  AST 225*  ALT 472*  ALKPHOS 78  BILITOT 0.9  PROT 5.6*  ALBUMIN 2.4*   No results for input(s): LIPASE, AMYLASE in the last 168 hours. No results for input(s): AMMONIA in the last 168 hours. CBC: Recent Labs  Lab 09/25/18 0552 09/26/18 0945 09/28/18 0210 09/28/18 0829 10/01/18 0140  WBC 6.0 5.7 7.6 11.6* 8.7  NEUTROABS 4.8 4.4  --   --   --   HGB 9.4* 8.8* 9.6* 9.0* 7.2*  HCT 29.4* 27.6* 31.6* 28.5* 22.6*  MCV 90.5 90.2 95.5 91.6 91.5  PLT 172 195 260 244 211   Cardiac Enzymes: No results for input(s): CKTOTAL, CKMB, CKMBINDEX, TROPONINI in the last 168 hours. BNP: BNP (last 3 results) Recent Labs    06/25/18 1209 09/22/18 1857 09/28/18 0210  BNP 800.3* 1,259.5* 1,315.0*    ProBNP (last 3 results) No results for input(s): PROBNP in the last 8760 hours.  CBG: Recent Labs  Lab 09/28/18 1648 09/28/18 2109 09/29/18 0620 09/29/18 1131 09/30/18 1638  GLUCAP 212* 123* 93 94 107*       Signed:  [name] 10/01/2018, 10:38 AM

## 2018-10-01 NOTE — Care Management Important Message (Signed)
Important Message  Patient Details  Name: Marcus [name] MRN: [id number] Date of Birth: Jan 28, 1929   Medicare Important Message Given:  Yes    Mehreen Azizi [address] 10/01/2018, 12:54 PM

## 2018-10-01 NOTE — Progress Notes (Signed)
Unable to give patient morning medication, midodrine this morning.  Patient to lethargic to understand to swallow medication safely.  Will consult with pharm and MD this morning.

## 2018-10-01 NOTE — Progress Notes (Signed)
[facility] for Infectious Disease   Reason for visit: Follow up on bacteremia  Interval History: no new complaints, no fever.  Creat climbing.  Now being transitioned to comfort care.    Physical Exam: Constitutional:  Vitals:   10/01/18 0620 10/01/18 0700  BP: (!) 80/47 (!) 82/39  Pulse:  70  Resp:  15  Temp:  97.9 F (36.6 C)  SpO2:  98%   patient appears in NAD, sleepy but arousable Eyes: anicteric Respiratory: Normal respiratory effort; CTA B Cardiovascular: RRR GI: soft, nt, nd  Review of Systems: Constitutional: negative for fevers and chills Gastrointestinal: negative for nausea and diarrhea Integument/breast: negative for rash  Lab Results  Component Value Date   WBC 8.7 10/01/2018   HGB 7.2 (L) 10/01/2018   HCT 22.6 (L) 10/01/2018   MCV 91.5 10/01/2018   PLT 211 10/01/2018    Lab Results  Component Value Date   CREATININE 5.58 (H) 10/01/2018   BUN 114 (H) 10/01/2018   NA 139 10/01/2018   K 3.7 10/01/2018   CL 102 10/01/2018   CO2 21 (L) 10/01/2018    Lab Results  Component Value Date   ALT 472 (H) 10/01/2018   AST 225 (H) 10/01/2018   ALKPHOS 78 10/01/2018     Microbiology: Recent Results (from the past 240 hour(s))  Culture, blood (routine x 2)     Status: Abnormal   Collection Time: 09/22/18  7:28 PM  Result Value Ref Range Status   Specimen Description BLOOD RIGHT ANTECUBITAL  Final   Special Requests   Final    BOTTLES DRAWN AEROBIC AND ANAEROBIC Blood Culture adequate volume   Culture  Setup Time (A)  Final    GRAM VARIABLE ROD Organism ID to follow CRITICAL RESULT CALLED TO, READ BACK BY AND VERIFIED WITH: Hughie Closs [facility] 09/23/18 1741 JDW    Culture (A)  Final    LISTERIA MONOCYTOGENES Standardized susceptibility testing for this organism is not available. HEALTH DEPARTMENT NOTIFIED Performed at [facility], [place] [address], [place], [place] [address]    Report Status 09/25/2018 FINAL  Final  Blood Culture ID Panel  (Reflexed)     Status: Abnormal   Collection Time: 09/22/18  7:28 PM  Result Value Ref Range Status   Enterococcus species NOT DETECTED NOT DETECTED Final   Vancomycin resistance NOT DETECTED NOT DETECTED Final   Listeria monocytogenes DETECTED (A) NOT DETECTED Final    Comment: CRITICAL RESULT CALLED TO, READ BACK BY AND VERIFIED WITH: Hughie Closs PHARMD 09/23/18 1741 JDW    Staphylococcus species NOT DETECTED NOT DETECTED Final   Staphylococcus aureus (BCID) NOT DETECTED NOT DETECTED Final   Methicillin resistance NOT DETECTED NOT DETECTED Final   Streptococcus species NOT DETECTED NOT DETECTED Final   Streptococcus agalactiae NOT DETECTED NOT DETECTED Final   Streptococcus pneumoniae NOT DETECTED NOT DETECTED Final   Streptococcus pyogenes NOT DETECTED NOT DETECTED Final   Acinetobacter baumannii NOT DETECTED NOT DETECTED Final   Enterobacteriaceae species NOT DETECTED NOT DETECTED Final   Enterobacter cloacae complex NOT DETECTED NOT DETECTED Final   Escherichia coli NOT DETECTED NOT DETECTED Final   Klebsiella oxytoca NOT DETECTED NOT DETECTED Final   Klebsiella pneumoniae NOT DETECTED NOT DETECTED Final   Proteus species NOT DETECTED NOT DETECTED Final   Serratia marcescens NOT DETECTED NOT DETECTED Final   Carbapenem resistance NOT DETECTED NOT DETECTED Final   Haemophilus influenzae NOT DETECTED NOT DETECTED Final   Neisseria meningitidis NOT  DETECTED NOT DETECTED Final   Pseudomonas aeruginosa NOT DETECTED NOT DETECTED Final   Candida albicans NOT DETECTED NOT DETECTED Final   Candida glabrata NOT DETECTED NOT DETECTED Final   Candida krusei NOT DETECTED NOT DETECTED Final   Candida parapsilosis NOT DETECTED NOT DETECTED Final   Candida tropicalis NOT DETECTED NOT DETECTED Final    Comment: Performed at [facility], [place] [address] [place], [place] [address]  Culture, blood (routine x 2)     Status: Abnormal   Collection Time: 09/22/18  7:47 PM  Result Value Ref Range  Status   Specimen Description BLOOD LEFT ANTECUBITAL  Final   Special Requests   Final    BOTTLES DRAWN AEROBIC ONLY Blood Culture adequate volume   Culture  Setup Time   Final    AEROBIC BOTTLE ONLY GRAM VARIABLE ROD CRITICAL VALUE NOTED.  VALUE IS CONSISTENT WITH PREVIOUSLY REPORTED AND CALLED VALUE.    Culture (A)  Final    LISTERIA MONOCYTOGENES Standardized susceptibility testing for this organism is not available. HEALTH DEPARTMENT NOTIFIED Performed at [facility], [place] [address], [place], [place] [address]    Report Status 09/25/2018 FINAL  Final  SARS Coronavirus 2 (CEPHEID - Performed in [facility] hospital lab), Hosp Order     Status: None   Collection Time: 09/22/18  7:47 PM  Result Value Ref Range Status   SARS Coronavirus 2 NEGATIVE NEGATIVE Final    Comment: (NOTE) If result is NEGATIVE SARS-CoV-2 target nucleic acids are NOT DETECTED. The SARS-CoV-2 RNA is generally detectable in upper and lower  respiratory specimens during the acute phase of infection. The lowest  concentration of SARS-CoV-2 viral copies this assay can detect is 250  copies / mL. A negative result does not preclude SARS-CoV-2 infection  and should not be used as the sole basis for treatment or other  patient management decisions.  A negative result may occur with  improper specimen collection / handling, submission of specimen other  than nasopharyngeal swab, presence of viral mutation(s) within the  areas targeted by this assay, and inadequate number of viral copies  (<250 copies / mL). A negative result must be combined with clinical  observations, patient history, and epidemiological information. If result is POSITIVE SARS-CoV-2 target nucleic acids are DETECTED. The SARS-CoV-2 RNA is generally detectable in upper and lower  respiratory specimens dur ing the acute phase of infection.  Positive  results are indicative of active infection with SARS-CoV-2.  Clinical  correlation with  patient history and other diagnostic information is  necessary to determine patient infection status.  Positive results do  not rule out bacterial infection or co-infection with other viruses. If result is PRESUMPTIVE POSTIVE SARS-CoV-2 nucleic acids MAY BE PRESENT.   A presumptive positive result was obtained on the submitted specimen  and confirmed on repeat testing.  While 2019 novel coronavirus  (SARS-CoV-2) nucleic acids may be present in the submitted sample  additional confirmatory testing may be necessary for epidemiological  and / or clinical management purposes  to differentiate between  SARS-CoV-2 and other Sarbecovirus currently known to infect humans.  If clinically indicated additional testing with an alternate test  methodology [phone]) is advised. The SARS-CoV-2 RNA is generally  detectable in upper and lower respiratory sp ecimens during the acute  phase of infection. The expected result is Negative. Fact Sheet for Patients:  StrictlyIdeas.no Fact Sheet for Healthcare Providers: BankingDealers.co.za This test is not yet approved or cleared by the  [place] Architectural technologist and has been authorized for detection and/or diagnosis of SARS-CoV-2 by FDA under an Print production planner (EUA).  This EUA will remain in effect (meaning this test can be used) for the duration of the COVID-19 declaration under Section 564(b)(1) of the Act, 21 U.S.C. section 360bbb-3(b)(1), unless the authorization is terminated or revoked sooner. Performed at [facility], [place] [address], [place], [place] [address]   MRSA PCR Screening     Status: None   Collection Time: 09/23/18  2:31 AM  Result Value Ref Range Status   MRSA by PCR NEGATIVE NEGATIVE Final    Comment:        The GeneXpert MRSA Assay (FDA approved for NASAL specimens only), is one component of a comprehensive MRSA colonization surveillance program. It is not intended to  diagnose MRSA infection nor to guide or monitor treatment for MRSA infections. Performed at [facility], [place] [address], [place], [place] [address]   Culture, blood (routine x 2)     Status: None   Collection Time: 09/25/18  7:32 AM  Result Value Ref Range Status   Specimen Description BLOOD RIGHT HAND  Final   Special Requests   Final    BOTTLES DRAWN AEROBIC ONLY Blood Culture adequate volume   Culture   Final    NO GROWTH 5 DAYS Performed at [facility], [place] [address] [place], [place] [address]    Report Status 09/30/2018 FINAL  Final  Culture, blood (routine x 2)     Status: None   Collection Time: 09/25/18  7:32 AM  Result Value Ref Range Status   Specimen Description BLOOD RIGHT HAND  Final   Special Requests   Final    BOTTLES DRAWN AEROBIC ONLY Blood Culture adequate volume   Culture   Final    NO GROWTH 5 DAYS Performed at [facility], [place] [address], [place], [place] [address]    Report Status 09/30/2018 FINAL  Final  SARS Coronavirus 2 (CEPHEID - Performed in [facility] hospital lab), Hosp Order     Status: None   Collection Time: 09/28/18  2:25 AM  Result Value Ref Range Status   SARS Coronavirus 2 NEGATIVE NEGATIVE Final    Comment: (NOTE) If result is NEGATIVE SARS-CoV-2 target nucleic acids are NOT DETECTED. The SARS-CoV-2 RNA is generally detectable in upper and lower  respiratory specimens during the acute phase of infection. The lowest  concentration of SARS-CoV-2 viral copies this assay can detect is 250  copies / mL. A negative result does not preclude SARS-CoV-2 infection  and should not be used as the sole basis for treatment or other  patient management decisions.  A negative result may occur with  improper specimen collection / handling, submission of specimen other  than nasopharyngeal swab, presence of viral mutation(s) within the  areas targeted by this assay, and inadequate number of viral copies  (<250 copies / mL). A  negative result must be combined with clinical  observations, patient history, and epidemiological information. If result is POSITIVE SARS-CoV-2 target nucleic acids are DETECTED. The SARS-CoV-2 RNA is generally detectable in upper and lower  respiratory specimens dur ing the acute phase of infection.  Positive  results are indicative of active infection with SARS-CoV-2.  Clinical  correlation with patient history and other diagnostic information is  necessary to determine patient infection status.  Positive results do  not rule out bacterial infection or co-infection with other viruses.  If result is PRESUMPTIVE POSTIVE SARS-CoV-2 nucleic acids MAY BE PRESENT.   A presumptive positive result was obtained on the submitted specimen  and confirmed on repeat testing.  While 2019 novel coronavirus  (SARS-CoV-2) nucleic acids may be present in the submitted sample  additional confirmatory testing may be necessary for epidemiological  and / or clinical management purposes  to differentiate between  SARS-CoV-2 and other Sarbecovirus currently known to infect humans.  If clinically indicated additional testing with an alternate test  methodology [phone]) is advised. The SARS-CoV-2 RNA is generally  detectable in upper and lower respiratory sp ecimens during the acute  phase of infection. The expected result is Negative. Fact Sheet for Patients:  StrictlyIdeas.no Fact Sheet for Healthcare Providers: BankingDealers.co.za This test is not yet approved or cleared by the [place] FDA and has been authorized for detection and/or diagnosis of SARS-CoV-2 by FDA under an Emergency Use Authorization (EUA).  This EUA will remain in effect (meaning this test can be used) for the duration of the COVID-19 declaration under Section 564(b)(1) of the Act, 21 U.S.C. section 360bbb-3(b)(1), unless the authorization is terminated or revoked sooner. Performed  at [facility], 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  Now to comfort care so antibiotics being stopped.   2.  Renal insufficiency - worsening.   3.  dispo - comfort measures now.  Discussed with Palliative care.    Will sign off, call with any changes.

## 2018-10-01 NOTE — Progress Notes (Addendum)
Advanced Heart Failure Rounding Note   Subjective:    Remains on milrinone. Minimal urine output with high-dose lasix. Creatinine up to 5.6  Confused and lethargic. SBP low 90s with MAPs in 50s.   Uncomfortable  Objective:   Weight Range:  Vital Signs:   Temp:  [96.7 F (35.9 C)-97.9 F (36.6 C)] 97.9 F (36.6 C) (05/13 0700) Pulse Rate:  [70-78] 70 (05/13 0700) Resp:  [13-15] 15 (05/13 0700) BP: (78-98)/(34-66) 82/39 (05/13 0700) SpO2:  [97 %-98 %] 98 % (05/13 0700) Weight:  [84.2 kg] 84.2 kg (05/13 0324) Last BM Date: 09/26/18  Weight change: Filed Weights   09/29/18 0326 09/30/18 0454 10/01/18 0324  Weight: 84.6 kg 85.2 kg 84.2 kg    Intake/Output:   Intake/Output Summary (Last 24 hours) at 10/01/2018 0933 Last data filed at 10/01/2018 0200 Gross per 24 hour  Intake 1440.55 ml  Output 450 ml  Net 990.55 ml     Physical Exam: General:  Elderly. Ill-appearing. No resp difficulty HEENT: normal Neck: supple.JVP to jaw. Carotids 2+ bilat; no bruits. No lymphadenopathy or thryomegaly appreciated. Cor: PMI nondisplaced. Regular rate & rhythm. +s3 Lungs: clear Abdomen: soft, nontender, +distended. No hepatosplenomegaly. No bruits or masses. Good bowel sounds. Extremities: no cyanosis, clubbing, rash, 2+ edema Neuro: awake confuse and uncomfortable    Telemetry: Sinus with biv pacing 70-80s Personally reviewed   Labs: Basic Metabolic Panel: Recent Labs  Lab 09/28/18 1509 09/28/18 2132 09/29/18 0604 09/30/18 0500 10/01/18 0140  NA 134* 134* 135 138 139  K 6.7* 5.5* 5.6* 4.0 3.7  CL 98 99 94* 96* 102  CO2 20* 19* 21* 21* 21*  GLUCOSE 114* 136* 95 131* 111*  BUN 91* 91* 95* 106* 114*  CREATININE 4.27* 4.46* 4.81* 4.91* 5.58*  CALCIUM 9.0 8.9 8.8* 8.6* 7.9*  MG  --   --  2.7*  --   --     Liver Function Tests: Recent Labs  Lab 10/01/18 0140  AST 225*  ALT 472*  ALKPHOS 78  BILITOT 0.9  PROT 5.6*  ALBUMIN 2.4*   No results for input(s):  LIPASE, AMYLASE in the last 168 hours. No results for input(s): AMMONIA in the last 168 hours.  CBC: Recent Labs  Lab 09/25/18 0552 09/26/18 0945 09/28/18 0210 09/28/18 0829 10/01/18 0140  WBC 6.0 5.7 7.6 11.6* 8.7  NEUTROABS 4.8 4.4  --   --   --   HGB 9.4* 8.8* 9.6* 9.0* 7.2*  HCT 29.4* 27.6* 31.6* 28.5* 22.6*  MCV 90.5 90.2 95.5 91.6 91.5  PLT 172 195 260 244 211    Cardiac Enzymes: No results for input(s): CKTOTAL, CKMB, CKMBINDEX, TROPONINI in the last 168 hours.  BNP: BNP (last 3 results) Recent Labs    06/25/18 1209 09/22/18 1857 09/28/18 0210  BNP 800.3* 1,259.5* 1,315.0*    ProBNP (last 3 results) No results for input(s): PROBNP in the last 8760 hours.    Other results:  Imaging: No results found.   Medications:     Scheduled Medications: . amiodarone  100 mg Oral Daily  . B-complex with vitamin C  1 tablet Oral Daily  . midodrine  5 mg Oral TID WC  . multivitamin  1 tablet Oral BID  . vitamin C  1,000 mg Oral Daily    Infusions: . sodium chloride Stopped (09/30/18 0725)  . ampicillin (OMNIPEN) IV    . furosemide 120 mg (10/01/18 0913)  . heparin 950 Units/hr (10/01/18 0200)  . milrinone  0.25 mcg/kg/min (10/01/18 0247)    PRN Medications: sodium chloride, albuterol   Assessment/Plan:   1. Acute on chronic systolic CHF-> cardiogenic shock:Ischemic cardiomyopathy. Echo in 1/19 showed EF 35-40% (stable from most recent prior). Has [employer] Jude CRT-P device. On device check on 08/06/18, he was appropriately BiV pacing with extremely rare AF, thoracic impedance was low. I recently increased his torsemide to 40 mg daily for volume overload. On admission, he looked  volume overloaded, likely in setting of AKI on CKD IV with Bactrim use.  BNP elevated, pulmonary edema on CXR.  - Echo 09/28/18 EF 20-25% - Initial co-ox on 5/11 38%. With CVP 18 - Started on milrinone with co-ox 68% today - Despite inotropic support he continues to deteriorate with  worsening edema and uremia. I talked with him and his wife and they do not want to proceed with CRRT. He will need to be transitioned to [place]. Palliative following.   2. AKI on CKD, stage MG:[id number] up 4.2 -> 4.4 -> 4.8 -> 5.6 with hyperkalemia in setting of Bactrim use and low output HF. - As per discussion above - He is actively dying for heart and renal failure - Not candidate for HD. Doesn't want CRRT -> wil switch to comfort care  3. 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 He sas been started on ampicillin + bactrim with plan for 3 week treatment. He has denied abdominal discomfort/diarrhea, has not been eating out. He was neurologically clear at discharge without localizing CNS symptoms/signs.  ID thought we could hold off on further CNS imaging (MRI, LP) with no signs of meningoencephalitis. Suspect Bactrim may have triggered AKI/hyperkalemia.  - ID following. On ampicillin. Of bactrim.  - COVID negative x 2 - see plan above  Transition to Comfort Care.   D/w his wife and Dr. Aundra Dubin. Will deactivate ICD.   CRITICAL CARE Performed by: Glori Bickers  Total critical care time: 45 minutes  Critical care time was exclusive of separately billable procedures and treating other patients.  Critical care was necessary to treat or prevent imminent or life-threatening deterioration.  Critical care was time spent personally by me (independent of midlevel providers or residents) on the following activities: development of treatment plan with patient and/or surrogate as well as nursing, discussions with consultants, evaluation of patient's response to treatment, examination of patient, obtaining history from patient or surrogate, ordering and performing treatments and interventions, ordering and review of laboratory studies, ordering and review of radiographic studies, pulse oximetry and re-evaluation of patient's condition.    Length of Stay: 3   Glori Bickers MD  10/01/2018, 9:33 AM  Advanced Heart Failure Team Pager [phone] (M-F; 7a - 4p)  Please contact [facility] for night-coverage after hours (4p -7a ) and weekends on [email or web address]

## 2018-10-01 NOTE — Progress Notes (Signed)
PHARMACY NOTE:  ANTIMICROBIAL RENAL DOSAGE ADJUSTMENT  Current antimicrobial regimen includes a mismatch between antimicrobial dosage and estimated renal function.  As per policy approved by the Pharmacy & Therapeutics and Medical Executive Committees, the antimicrobial dosage will be adjusted accordingly.  Current antimicrobial dosage:  Ampicillin 2 gm Q 8 hours   Indication: Listeria bacteremia  Renal Function:  Estimated Creatinine Clearance: 9.3 mL/min (A) (by C-G formula based on SCr of 5.58 mg/dL (H)). []      On intermittent HD, scheduled: []      On CRRT    Antimicrobial dosage has been changed to:  Ampicillin 2 gm Q 12 hours   Additional comments:    Thank you for allowing pharmacy to be a part of this patient's care.  Jimmy Footman, PharmD, BCPS, BCIDP Infectious Diseases Clinical Pharmacist Phone: [phone] 10/01/2018 8:54 AM

## 2018-10-07 ENCOUNTER — Inpatient Hospital Stay: Payer: Medicare Other | Admitting: Internal Medicine

## 2018-10-08 ENCOUNTER — Other Ambulatory Visit: Payer: Self-pay

## 2018-10-08 ENCOUNTER — Telehealth (HOSPITAL_COMMUNITY): Payer: Medicare Other

## 2018-10-08 ENCOUNTER — Telehealth: Payer: Self-pay

## 2018-10-08 NOTE — Telephone Encounter (Signed)
Unable to leave a message for patient to remind of missed remote transmission.  

## 2018-10-14 NOTE — Progress Notes (Signed)
No ICM remote transmission received for 10/08/2018 and next ICM transmission scheduled for 10/27/2018.

## 2018-10-20 DEATH — deceased

## 2018-10-27 ENCOUNTER — Encounter (HOSPITAL_COMMUNITY): Payer: Medicare Other | Admitting: Cardiology

## 2018-11-13 ENCOUNTER — Encounter: Payer: Medicare Other | Admitting: Internal Medicine

## 2018-11-13 ENCOUNTER — Encounter

## 2018-12-05 ENCOUNTER — Telehealth (HOSPITAL_COMMUNITY): Payer: Self-pay

## 2018-12-05 NOTE — Telephone Encounter (Signed)
HH Cert and POC signed and faxed to [facility], confirmation received
